# Patient Record
Sex: Female | Born: 1940 | Race: White | Hispanic: No | Marital: Married | Smoking: Never smoker
Health system: Southern US, Community
[De-identification: ages and names within clinical notes are randomized; demographics above are authoritative.]

## PROBLEM LIST (undated history)

## (undated) DIAGNOSIS — M1990 Unspecified osteoarthritis, unspecified site: Secondary | ICD-10-CM

## (undated) DIAGNOSIS — R7303 Prediabetes: Secondary | ICD-10-CM

## (undated) DIAGNOSIS — M109 Gout, unspecified: Secondary | ICD-10-CM

## (undated) DIAGNOSIS — G709 Myoneural disorder, unspecified: Secondary | ICD-10-CM

## (undated) DIAGNOSIS — K219 Gastro-esophageal reflux disease without esophagitis: Secondary | ICD-10-CM

## (undated) HISTORY — PX: BACK SURGERY: SHX140

---

## 1992-04-13 HISTORY — PX: REDUCTION MAMMAPLASTY: SUR839

## 1993-04-13 HISTORY — PX: CHOLECYSTECTOMY: SHX55

## 2004-03-26 ENCOUNTER — Ambulatory Visit: Payer: Self-pay | Admitting: Internal Medicine

## 2005-04-21 ENCOUNTER — Ambulatory Visit: Payer: Self-pay | Admitting: Internal Medicine

## 2006-03-01 ENCOUNTER — Ambulatory Visit: Payer: Self-pay | Admitting: Orthopaedic Surgery

## 2006-04-23 ENCOUNTER — Other Ambulatory Visit: Payer: Self-pay

## 2006-04-26 ENCOUNTER — Ambulatory Visit: Payer: Self-pay | Admitting: Internal Medicine

## 2006-05-03 ENCOUNTER — Ambulatory Visit: Payer: Self-pay | Admitting: Orthopaedic Surgery

## 2006-05-17 ENCOUNTER — Ambulatory Visit: Payer: Self-pay | Admitting: Gastroenterology

## 2006-11-08 ENCOUNTER — Inpatient Hospital Stay: Payer: Self-pay | Admitting: Internal Medicine

## 2006-11-08 ENCOUNTER — Other Ambulatory Visit: Payer: Self-pay

## 2007-05-04 ENCOUNTER — Ambulatory Visit: Payer: Self-pay | Admitting: Internal Medicine

## 2008-05-07 ENCOUNTER — Ambulatory Visit: Payer: Self-pay | Admitting: Internal Medicine

## 2009-05-14 ENCOUNTER — Ambulatory Visit: Payer: Self-pay | Admitting: Internal Medicine

## 2010-03-30 ENCOUNTER — Ambulatory Visit: Payer: Self-pay | Admitting: Internal Medicine

## 2010-05-15 ENCOUNTER — Ambulatory Visit: Payer: Self-pay | Admitting: Internal Medicine

## 2011-05-18 ENCOUNTER — Ambulatory Visit: Payer: Self-pay | Admitting: Internal Medicine

## 2011-12-21 ENCOUNTER — Ambulatory Visit: Payer: Self-pay | Admitting: Gastroenterology

## 2012-06-07 ENCOUNTER — Ambulatory Visit: Payer: Self-pay | Admitting: Internal Medicine

## 2013-06-09 ENCOUNTER — Ambulatory Visit: Payer: Self-pay | Admitting: Internal Medicine

## 2014-04-25 ENCOUNTER — Ambulatory Visit: Payer: Self-pay | Admitting: Rheumatology

## 2014-06-11 ENCOUNTER — Ambulatory Visit: Payer: Self-pay | Admitting: Internal Medicine

## 2014-06-27 ENCOUNTER — Encounter: Payer: Self-pay | Admitting: Rheumatology

## 2014-07-26 ENCOUNTER — Emergency Department
Admit: 2014-07-26 | Disposition: A | Discharge status: Home / Self Care | Payer: Self-pay | Admitting: Emergency Medicine

## 2014-07-26 LAB — COMPREHENSIVE METABOLIC PANEL
ALT: 18 U/L
Albumin: 5.2 g/dL — ABNORMAL HIGH
Alkaline Phosphatase: 87 U/L
Anion Gap: 15 (ref 7–16)
BILIRUBIN TOTAL: 0.6 mg/dL
BUN: 23 mg/dL — ABNORMAL HIGH
CO2: 22 mmol/L
CREATININE: 1.49 mg/dL — AB
Calcium, Total: 10.4 mg/dL — ABNORMAL HIGH
Chloride: 100 mmol/L — ABNORMAL LOW
GFR CALC AF AMER: 41 — AB
GFR CALC NON AF AMER: 35 — AB
GLUCOSE: 219 mg/dL — AB
Potassium: 3.8 mmol/L
SGOT(AST): 38 U/L
SODIUM: 137 mmol/L
Total Protein: 9.5 g/dL — ABNORMAL HIGH

## 2014-07-26 LAB — CBC WITH DIFFERENTIAL/PLATELET
Basophil #: 0.1 10*3/uL (ref 0.0–0.1)
Basophil %: 0.2 %
Eosinophil #: 0 10*3/uL (ref 0.0–0.7)
Eosinophil %: 0.1 %
HCT: 54.2 % — ABNORMAL HIGH (ref 35.0–47.0)
HGB: 17.6 g/dL — AB (ref 12.0–16.0)
LYMPHS ABS: 1.2 10*3/uL (ref 1.0–3.6)
LYMPHS PCT: 5.3 %
MCH: 28.3 pg (ref 26.0–34.0)
MCHC: 32.5 g/dL (ref 32.0–36.0)
MCV: 87 fL (ref 80–100)
MONOS PCT: 2.3 %
Monocyte #: 0.5 x10 3/mm (ref 0.2–0.9)
Neutrophil #: 21.5 10*3/uL — ABNORMAL HIGH (ref 1.4–6.5)
Neutrophil %: 92.1 %
PLATELETS: 371 10*3/uL (ref 150–440)
RBC: 6.21 10*6/uL — ABNORMAL HIGH (ref 3.80–5.20)
RDW: 14 % (ref 11.5–14.5)
WBC: 23.3 10*3/uL — AB (ref 3.6–11.0)

## 2014-07-26 LAB — LIPASE, BLOOD: Lipase: 36 U/L

## 2014-07-26 LAB — TROPONIN I: Troponin-I: <0.03 ng/mL

## 2014-09-28 ENCOUNTER — Other Ambulatory Visit: Payer: Self-pay | Admitting: Physician Assistant

## 2014-09-28 DIAGNOSIS — R1013 Epigastric pain: Secondary | ICD-10-CM

## 2014-09-28 DIAGNOSIS — R197 Diarrhea, unspecified: Secondary | ICD-10-CM

## 2014-09-28 DIAGNOSIS — R112 Nausea with vomiting, unspecified: Secondary | ICD-10-CM

## 2014-10-04 ENCOUNTER — Ambulatory Visit
Admission: RE | Admit: 2014-10-04 | Discharge: 2014-10-04 | Disposition: A | Discharge status: Home / Self Care | Payer: Medicare PPO | Source: Ambulatory Visit | Attending: Physician Assistant | Admitting: Physician Assistant

## 2014-10-04 DIAGNOSIS — R112 Nausea with vomiting, unspecified: Secondary | ICD-10-CM | POA: Insufficient documentation

## 2014-10-04 DIAGNOSIS — R197 Diarrhea, unspecified: Secondary | ICD-10-CM | POA: Diagnosis present

## 2014-10-04 DIAGNOSIS — N281 Cyst of kidney, acquired: Secondary | ICD-10-CM | POA: Insufficient documentation

## 2014-10-04 DIAGNOSIS — R1013 Epigastric pain: Secondary | ICD-10-CM

## 2014-10-04 MED ORDER — IOHEXOL 300 MG/ML  SOLN
100.0000 mL | Freq: Once | INTRAMUSCULAR | Status: AC | PRN
  Administered 2014-10-04: 100 mL via INTRAVENOUS

## 2015-06-06 ENCOUNTER — Other Ambulatory Visit: Payer: Self-pay | Admitting: Internal Medicine

## 2015-06-06 DIAGNOSIS — Z1231 Encounter for screening mammogram for malignant neoplasm of breast: Secondary | ICD-10-CM

## 2015-06-14 ENCOUNTER — Ambulatory Visit
Admission: RE | Admit: 2015-06-14 | Discharge: 2015-06-14 | Disposition: A | Discharge status: Home / Self Care | Payer: Medicare Other | Source: Ambulatory Visit | Attending: Internal Medicine | Admitting: Internal Medicine

## 2015-06-14 ENCOUNTER — Other Ambulatory Visit: Payer: Self-pay | Admitting: Internal Medicine

## 2015-06-14 DIAGNOSIS — Z1231 Encounter for screening mammogram for malignant neoplasm of breast: Secondary | ICD-10-CM

## 2016-06-12 ENCOUNTER — Other Ambulatory Visit: Payer: Self-pay | Admitting: Internal Medicine

## 2016-06-12 DIAGNOSIS — Z1231 Encounter for screening mammogram for malignant neoplasm of breast: Secondary | ICD-10-CM

## 2016-06-16 ENCOUNTER — Ambulatory Visit
Admission: RE | Admit: 2016-06-16 | Discharge: 2016-06-16 | Disposition: A | Discharge status: Home / Self Care | Payer: Medicare Other | Source: Ambulatory Visit | Attending: Internal Medicine | Admitting: Internal Medicine

## 2016-06-16 DIAGNOSIS — Z1231 Encounter for screening mammogram for malignant neoplasm of breast: Secondary | ICD-10-CM | POA: Insufficient documentation

## 2016-09-23 ENCOUNTER — Other Ambulatory Visit: Payer: Self-pay | Admitting: Orthopedic Surgery

## 2016-09-23 DIAGNOSIS — M5416 Radiculopathy, lumbar region: Secondary | ICD-10-CM

## 2016-09-25 ENCOUNTER — Ambulatory Visit
Admission: RE | Admit: 2016-09-25 | Discharge: 2016-09-25 | Disposition: A | Discharge status: Home / Self Care | Payer: Medicare Other | Source: Ambulatory Visit | Attending: Orthopedic Surgery | Admitting: Orthopedic Surgery

## 2016-09-25 DIAGNOSIS — M5416 Radiculopathy, lumbar region: Secondary | ICD-10-CM

## 2016-10-01 ENCOUNTER — Other Ambulatory Visit: Payer: Self-pay | Admitting: Orthopedic Surgery

## 2016-10-13 NOTE — Pre-Procedure Instructions (Signed)
Meredith Roach  10/13/2016      [facility] [place], [place] - [address] [place] [address] Phone: [phone] Fax: [phone]    Your procedure is scheduled on 10/21/16  Report to [facility] Admitting at 1100 A.M.  Call this number if you have problems the morning of surgery:  [phone]   Remember:  Do not eat food or drink liquids after midnight.  Take these medicines the morning of surgery with A SIP OF WATER     Duloxetine,ranitidine, hydrocodone if needed  STOP all herbel meds, nsaids (aleve,naproxen,advil,ibuprofen)   prior to surgery starting today/10/15/16 including all vitamins/supplements,cranberry-vit c,glucosamin-chondrotin ,biotin    Do not wear jewelry, make-up or nail polish.  Do not wear lotions, powders, or perfumes, or deoderant.  Do not shave 48 hours prior to surgery.  Men may shave face and neck.  Do not bring valuables to the hospital.  [facility] is not responsible for any belongings or valuables.  Contacts, dentures or bridgework may not be worn into surgery.  Leave your suitcase in the car.  After surgery it may be brought to your room.  For patients admitted to the hospital, discharge time will be determined by your treatment team.  Patients discharged the day of surgery will not be allowed to drive home.   Special instructions:   Special Instructions: [place] - Preparing for Surgery  Before surgery, you can play an important role.  Because skin is not sterile, your skin needs to be as free of germs as possible.  You can reduce the number of germs on you skin by washing with CHG (chlorahexidine gluconate) soap before surgery.  CHG is an antiseptic cleaner which kills germs and bonds with the skin to continue killing germs even after washing.  Please DO NOT use if you have an allergy to CHG or antibacterial soaps.  If your skin becomes reddened/irritated stop using the CHG and inform your nurse  when you arrive at Short Stay.  Do not shave (including legs and underarms) for at least 48 hours prior to the first CHG shower.  You may shave your face.  Please follow these instructions carefully:   1.  Shower with CHG Soap the night before surgery and the morning of Surgery.  2.  If you choose to wash your hair, wash your hair first as usual with your normal shampoo.  3.  After you shampoo, rinse your hair and body thoroughly to remove the Shampoo.  4.  Use CHG as you would any other liquid soap.  You can apply chg directly  to the skin and wash gently with scrungie or a clean washcloth.  5.  Apply the CHG Soap to your body ONLY FROM THE NECK DOWN.  Do not use on open wounds or open sores.  Avoid contact with your eyes ears, mouth and genitals (private parts).  Wash genitals (private parts)       with your normal soap.  6.  Wash thoroughly, paying special attention to the area where your surgery will be performed.  7.  Thoroughly rinse your body with warm water from the neck down.  8.  DO NOT shower/wash with your normal soap after using and rinsing off the CHG Soap.  9.  Pat yourself dry with a clean towel.            10.  Wear clean pajamas.  11.  Place clean sheets on your bed the night of your first shower and do not sleep with pets.  Day of Surgery  Do not apply any lotions/deodorants the morning of surgery.  Please wear clean clothes to the hospital/surgery center.  Please read over the  fact sheets that you were given.

## 2016-10-15 ENCOUNTER — Encounter (HOSPITAL_COMMUNITY): Payer: Self-pay

## 2016-10-15 ENCOUNTER — Encounter (HOSPITAL_COMMUNITY)
Admission: RE | Admit: 2016-10-15 | Discharge: 2016-10-15 | Disposition: A | Discharge status: Home / Self Care | Payer: Medicare Other | Source: Ambulatory Visit | Attending: Orthopedic Surgery | Admitting: Orthopedic Surgery

## 2016-10-15 ENCOUNTER — Ambulatory Visit (HOSPITAL_COMMUNITY)
Admission: RE | Admit: 2016-10-15 | Discharge: 2016-10-15 | Disposition: A | Discharge status: Home / Self Care | Payer: Medicare Other | Source: Ambulatory Visit | Attending: Orthopedic Surgery | Admitting: Orthopedic Surgery

## 2016-10-15 DIAGNOSIS — Z01818 Encounter for other preprocedural examination: Secondary | ICD-10-CM | POA: Insufficient documentation

## 2016-10-15 DIAGNOSIS — Z01812 Encounter for preprocedural laboratory examination: Secondary | ICD-10-CM | POA: Insufficient documentation

## 2016-10-15 DIAGNOSIS — Z0183 Encounter for blood typing: Secondary | ICD-10-CM | POA: Diagnosis not present

## 2016-10-15 DIAGNOSIS — M79604 Pain in right leg: Secondary | ICD-10-CM | POA: Diagnosis not present

## 2016-10-15 DIAGNOSIS — R9431 Abnormal electrocardiogram [ECG] [EKG]: Secondary | ICD-10-CM | POA: Diagnosis not present

## 2016-10-15 HISTORY — DX: Prediabetes: R73.03

## 2016-10-15 HISTORY — DX: Unspecified osteoarthritis, unspecified site: M19.90

## 2016-10-15 HISTORY — DX: Gastro-esophageal reflux disease without esophagitis: K21.9

## 2016-10-15 LAB — COMPREHENSIVE METABOLIC PANEL
ALK PHOS: 62 U/L (ref 38–126)
ALT: 16 U/L (ref 14–54)
AST: 25 U/L (ref 15–41)
Albumin: 4 g/dL (ref 3.5–5.0)
Anion gap: 11 (ref 5–15)
BILIRUBIN TOTAL: 0.8 mg/dL (ref 0.3–1.2)
BUN: 14 mg/dL (ref 6–20)
CALCIUM: 9.2 mg/dL (ref 8.9–10.3)
CHLORIDE: 103 mmol/L (ref 101–111)
CO2: 28 mmol/L (ref 22–32)
CREATININE: 0.93 mg/dL (ref 0.44–1.00)
GFR calc Af Amer: >60 mL/min (ref >60)
GFR calc non Af Amer: 60 mL/min — ABNORMAL LOW (ref >60)
Glucose, Bld: 131 mg/dL — ABNORMAL HIGH (ref 65–99)
Potassium: 3.4 mmol/L — ABNORMAL LOW (ref 3.5–5.1)
Sodium: 142 mmol/L (ref 135–145)
Total Protein: 6.9 g/dL (ref 6.5–8.1)

## 2016-10-15 LAB — URINALYSIS, ROUTINE W REFLEX MICROSCOPIC
BILIRUBIN URINE: NEGATIVE
GLUCOSE, UA: NEGATIVE mg/dL
Hgb urine dipstick: NEGATIVE
KETONES UR: NEGATIVE mg/dL
Nitrite: NEGATIVE
PH: 5 (ref 5.0–8.0)
Protein, ur: NEGATIVE mg/dL
Specific Gravity, Urine: 1.027 (ref 1.005–1.030)

## 2016-10-15 LAB — CBC WITH DIFFERENTIAL/PLATELET
BASOS ABS: 0 10*3/uL (ref 0.0–0.1)
Basophils Relative: 0 %
Eosinophils Absolute: 0.1 10*3/uL (ref 0.0–0.7)
Eosinophils Relative: 2 %
HEMATOCRIT: 44.4 % (ref 36.0–46.0)
HEMOGLOBIN: 14.6 g/dL (ref 12.0–15.0)
LYMPHS ABS: 1.8 10*3/uL (ref 0.7–4.0)
LYMPHS PCT: 33 %
MCH: 28.6 pg (ref 26.0–34.0)
MCHC: 32.9 g/dL (ref 30.0–36.0)
MCV: 86.9 fL (ref 78.0–100.0)
Monocytes Absolute: 0.3 10*3/uL (ref 0.1–1.0)
Monocytes Relative: 6 %
NEUTROS ABS: 3.2 10*3/uL (ref 1.7–7.7)
NEUTROS PCT: 59 %
PLATELETS: 241 10*3/uL (ref 150–400)
RBC: 5.11 MIL/uL (ref 3.87–5.11)
RDW: 13.1 % (ref 11.5–15.5)
WBC: 5.5 10*3/uL (ref 4.0–10.5)

## 2016-10-15 LAB — TYPE AND SCREEN
ABO/RH(D): A POS
ANTIBODY SCREEN: NEGATIVE

## 2016-10-15 LAB — APTT: aPTT: 25 seconds (ref 24–36)

## 2016-10-15 LAB — ABO/RH: ABO/RH(D): A POS

## 2016-10-15 LAB — PROTIME-INR
INR: 0.9
PROTHROMBIN TIME: 12.1 s (ref 11.4–15.2)

## 2016-10-15 LAB — SURGICAL PCR SCREEN
MRSA, PCR: NEGATIVE
Staphylococcus aureus: NEGATIVE

## 2016-10-15 NOTE — Progress Notes (Signed)
PCP:Dr. Emily Filbert @ [facility]

## 2016-10-15 NOTE — Pre-Procedure Instructions (Signed)
JOMARIE GELLIS  10/15/2016      [facility] [place], [place] - [address] [place] [address] Phone: [phone] Fax: [phone]    Your procedure is scheduled on 10/21/16  Report to [facility] Admitting at 10:00 A.M.  Call this number if you have problems the morning of surgery:  [phone]   Remember:  Do not eat food or drink liquids after midnight.  Take these medicines the morning of surgery with A SIP OF WATER     Duloxetine,ranitidine, hydrocodone if needed  STOP all herbel meds, nsaids (aleve,naproxen,advil,ibuprofen)   prior to surgery starting today/10/15/16 including all vitamins/supplements,cranberry-vit c,glucosamin-chondrotin ,biotin    Do not wear jewelry, make-up or nail polish.  Do not wear lotions, powders, or perfumes, or deoderant.  Do not shave 48 hours prior to surgery.  Men may shave face and neck.  Do not bring valuables to the hospital.  [facility] is not responsible for any belongings or valuables.  Contacts, dentures or bridgework may not be worn into surgery.  Leave your suitcase in the car.  After surgery it may be brought to your room.  For patients admitted to the hospital, discharge time will be determined by your treatment team.  Patients discharged the day of surgery will not be allowed to drive home.   Special instructions:   Special Instructions: [place] - Preparing for Surgery  Before surgery, you can play an important role.  Because skin is not sterile, your skin needs to be as free of germs as possible.  You can reduce the number of germs on you skin by washing with CHG (chlorahexidine gluconate) soap before surgery.  CHG is an antiseptic cleaner which kills germs and bonds with the skin to continue killing germs even after washing.  Please DO NOT use if you have an allergy to CHG or antibacterial soaps.  If your skin becomes reddened/irritated stop using the CHG and inform your nurse  when you arrive at Short Stay.  Do not shave (including legs and underarms) for at least 48 hours prior to the first CHG shower.  You may shave your face.  Please follow these instructions carefully:   1.  Shower with CHG Soap the night before surgery and the morning of Surgery.  2.  If you choose to wash your hair, wash your hair first as usual with your normal shampoo.  3.  After you shampoo, rinse your hair and body thoroughly to remove the Shampoo.  4.  Use CHG as you would any other liquid soap.  You can apply chg directly  to the skin and wash gently with scrungie or a clean washcloth.  5.  Apply the CHG Soap to your body ONLY FROM THE NECK DOWN.  Do not use on open wounds or open sores.  Avoid contact with your eyes ears, mouth and genitals (private parts).  Wash genitals (private parts)       with your normal soap.  6.  Wash thoroughly, paying special attention to the area where your surgery will be performed.  7.  Thoroughly rinse your body with warm water from the neck down.  8.  DO NOT shower/wash with your normal soap after using and rinsing off the CHG Soap.  9.  Pat yourself dry with a clean towel.            10.  Wear clean pajamas.  11.  Place clean sheets on your bed the night of your first shower and do not sleep with pets.  Day of Surgery  Do not apply any lotions/deodorants the morning of surgery.  Please wear clean clothes to the hospital/surgery center.  Please read over the  fact sheets that you were given.

## 2016-10-21 ENCOUNTER — Inpatient Hospital Stay (HOSPITAL_COMMUNITY): Payer: Medicare Other | Admitting: Anesthesiology

## 2016-10-21 ENCOUNTER — Inpatient Hospital Stay (HOSPITAL_COMMUNITY): Payer: Medicare Other

## 2016-10-21 ENCOUNTER — Encounter (HOSPITAL_COMMUNITY): Payer: Self-pay | Admitting: Anesthesiology

## 2016-10-21 ENCOUNTER — Inpatient Hospital Stay (HOSPITAL_COMMUNITY)
Admission: RE | Admit: 2016-10-21 | Discharge: 2016-10-22 | DRG: 455 | Disposition: A | Discharge status: Home / Self Care | Payer: Medicare Other | Source: Ambulatory Visit | Attending: Orthopedic Surgery | Admitting: Orthopedic Surgery

## 2016-10-21 ENCOUNTER — Encounter (HOSPITAL_COMMUNITY)
Admission: RE | Disposition: A | Discharge status: Home / Self Care | Payer: Self-pay | Source: Ambulatory Visit | Attending: Orthopedic Surgery

## 2016-10-21 DIAGNOSIS — R7303 Prediabetes: Secondary | ICD-10-CM | POA: Diagnosis present

## 2016-10-21 DIAGNOSIS — M4316 Spondylolisthesis, lumbar region: Secondary | ICD-10-CM | POA: Diagnosis present

## 2016-10-21 DIAGNOSIS — Z9049 Acquired absence of other specified parts of digestive tract: Secondary | ICD-10-CM

## 2016-10-21 DIAGNOSIS — Z6832 Body mass index (BMI) 32.0-32.9, adult: Secondary | ICD-10-CM

## 2016-10-21 DIAGNOSIS — Z79899 Other long term (current) drug therapy: Secondary | ICD-10-CM | POA: Diagnosis not present

## 2016-10-21 DIAGNOSIS — Z419 Encounter for procedure for purposes other than remedying health state, unspecified: Secondary | ICD-10-CM

## 2016-10-21 DIAGNOSIS — Z803 Family history of malignant neoplasm of breast: Secondary | ICD-10-CM | POA: Diagnosis not present

## 2016-10-21 DIAGNOSIS — K219 Gastro-esophageal reflux disease without esophagitis: Secondary | ICD-10-CM | POA: Diagnosis not present

## 2016-10-21 DIAGNOSIS — E669 Obesity, unspecified: Secondary | ICD-10-CM | POA: Diagnosis not present

## 2016-10-21 DIAGNOSIS — M79604 Pain in right leg: Secondary | ICD-10-CM | POA: Diagnosis present

## 2016-10-21 DIAGNOSIS — M5416 Radiculopathy, lumbar region: Secondary | ICD-10-CM | POA: Diagnosis present

## 2016-10-21 DIAGNOSIS — M5410 Radiculopathy, site unspecified: Secondary | ICD-10-CM | POA: Diagnosis present

## 2016-10-21 DIAGNOSIS — M1990 Unspecified osteoarthritis, unspecified site: Secondary | ICD-10-CM | POA: Diagnosis present

## 2016-10-21 DIAGNOSIS — M48061 Spinal stenosis, lumbar region without neurogenic claudication: Principal | ICD-10-CM | POA: Diagnosis present

## 2016-10-21 DIAGNOSIS — Z79891 Long term (current) use of opiate analgesic: Secondary | ICD-10-CM | POA: Diagnosis not present

## 2016-10-21 SURGERY — POSTERIOR LUMBAR FUSION 1 LEVEL
Anesthesia: General | Laterality: Right

## 2016-10-21 MED ORDER — FENTANYL CITRATE (PF) 100 MCG/2ML IJ SOLN: 25.0000 ug | INTRAMUSCULAR | Status: DC | PRN

## 2016-10-21 MED ORDER — MENTHOL 3 MG MT LOZG: 1.0000 | LOZENGE | OROMUCOSAL | Status: DC | PRN

## 2016-10-21 MED ORDER — POTASSIUM CHLORIDE IN NACL 20-0.9 MEQ/L-% IV SOLN: INTRAVENOUS | Status: DC

## 2016-10-21 MED ORDER — CEFAZOLIN SODIUM-DEXTROSE 2-4 GM/100ML-% IV SOLN
2.0000 g | INTRAVENOUS | Status: AC
  Administered 2016-10-21: 2 g via INTRAVENOUS
  Filled 2016-10-21: qty 100

## 2016-10-21 MED ORDER — DEXTROSE 5 % IV SOLN
INTRAVENOUS | Status: DC | PRN
  Administered 2016-10-21: 15 ug/min via INTRAVENOUS

## 2016-10-21 MED ORDER — LIDOCAINE HCL (CARDIAC) 20 MG/ML IV SOLN
INTRAVENOUS | Status: DC | PRN
  Administered 2016-10-21: 60 mg via INTRAVENOUS

## 2016-10-21 MED ORDER — LACTATED RINGERS IV SOLN: INTRAVENOUS | Status: DC | PRN

## 2016-10-21 MED ORDER — ACETAMINOPHEN 325 MG PO TABS: 650.0000 mg | ORAL_TABLET | ORAL | Status: DC | PRN

## 2016-10-21 MED ORDER — ROCURONIUM BROMIDE 50 MG/5ML IV SOLN
INTRAVENOUS | Status: AC
  Filled 2016-10-21: qty 1

## 2016-10-21 MED ORDER — BUPIVACAINE-EPINEPHRINE (PF) 0.25% -1:200000 IJ SOLN
INTRAMUSCULAR | Status: AC
  Filled 2016-10-21: qty 30

## 2016-10-21 MED ORDER — PROPOFOL 10 MG/ML IV BOLUS
INTRAVENOUS | Status: DC | PRN
  Administered 2016-10-21: 170 mg via INTRAVENOUS

## 2016-10-21 MED ORDER — FLEET ENEMA 7-19 GM/118ML RE ENEM: 1.0000 | ENEMA | Freq: Once | RECTAL | Status: DC | PRN

## 2016-10-21 MED ORDER — SODIUM CHLORIDE 0.9% FLUSH: 3.0000 mL | Freq: Two times a day (BID) | INTRAVENOUS | Status: DC

## 2016-10-21 MED ORDER — ROCURONIUM BROMIDE 100 MG/10ML IV SOLN
INTRAVENOUS | Status: DC | PRN
  Administered 2016-10-21: 20 mg via INTRAVENOUS
  Administered 2016-10-21: 50 mg via INTRAVENOUS

## 2016-10-21 MED ORDER — CEFAZOLIN SODIUM-DEXTROSE 2-4 GM/100ML-% IV SOLN
2.0000 g | Freq: Three times a day (TID) | INTRAVENOUS | Status: AC
  Administered 2016-10-21 – 2016-10-22 (×2): 2 g via INTRAVENOUS
  Filled 2016-10-21 (×2): qty 100

## 2016-10-21 MED ORDER — ACETAMINOPHEN 650 MG RE SUPP: 650.0000 mg | RECTAL | Status: DC | PRN

## 2016-10-21 MED ORDER — MIDAZOLAM HCL 2 MG/2ML IJ SOLN
INTRAMUSCULAR | Status: AC
  Filled 2016-10-21: qty 2

## 2016-10-21 MED ORDER — HEMOSTATIC AGENTS (NO CHARGE) OPTIME
TOPICAL | Status: DC | PRN
  Administered 2016-10-21: 1 via TOPICAL

## 2016-10-21 MED ORDER — THROMBIN 20000 UNITS EX KIT
PACK | CUTANEOUS | Status: DC | PRN
  Administered 2016-10-21: 20000 [IU] via TOPICAL

## 2016-10-21 MED ORDER — THROMBIN 20000 UNITS EX SOLR
CUTANEOUS | Status: AC
  Filled 2016-10-21: qty 20000

## 2016-10-21 MED ORDER — DIAZEPAM 5 MG PO TABS
5.0000 mg | ORAL_TABLET | Freq: Four times a day (QID) | ORAL | Status: DC | PRN
  Administered 2016-10-21 – 2016-10-22 (×2): 5 mg via ORAL
  Filled 2016-10-21 (×2): qty 1

## 2016-10-21 MED ORDER — PANTOPRAZOLE SODIUM 40 MG IV SOLR
40.0000 mg | Freq: Every day | INTRAVENOUS | Status: DC
  Administered 2016-10-21: 40 mg via INTRAVENOUS
  Filled 2016-10-21: qty 40

## 2016-10-21 MED ORDER — LACTATED RINGERS IV SOLN
INTRAVENOUS | Status: DC | PRN
  Administered 2016-10-21 (×3): via INTRAVENOUS

## 2016-10-21 MED ORDER — SENNOSIDES-DOCUSATE SODIUM 8.6-50 MG PO TABS: 1.0000 | ORAL_TABLET | Freq: Every evening | ORAL | Status: DC | PRN

## 2016-10-21 MED ORDER — DULOXETINE HCL 30 MG PO CPEP
60.0000 mg | ORAL_CAPSULE | Freq: Every day | ORAL | Status: DC
  Administered 2016-10-21: 60 mg via ORAL
  Filled 2016-10-21: qty 2

## 2016-10-21 MED ORDER — BISACODYL 5 MG PO TBEC: 5.0000 mg | DELAYED_RELEASE_TABLET | Freq: Every day | ORAL | Status: DC | PRN

## 2016-10-21 MED ORDER — LIDOCAINE HCL (CARDIAC) 20 MG/ML IV SOLN
INTRAVENOUS | Status: AC
  Filled 2016-10-21: qty 5

## 2016-10-21 MED ORDER — SUCCINYLCHOLINE CHLORIDE 200 MG/10ML IV SOSY
PREFILLED_SYRINGE | INTRAVENOUS | Status: AC
  Filled 2016-10-21: qty 10

## 2016-10-21 MED ORDER — BUPIVACAINE LIPOSOME 1.3 % IJ SUSP
INTRAMUSCULAR | Status: DC | PRN
  Administered 2016-10-21: 20 mL

## 2016-10-21 MED ORDER — ZOLPIDEM TARTRATE 5 MG PO TABS: 5.0000 mg | ORAL_TABLET | Freq: Every evening | ORAL | Status: DC | PRN

## 2016-10-21 MED ORDER — OXYCODONE HCL 5 MG/5ML PO SOLN: 5.0000 mg | Freq: Once | ORAL | Status: DC | PRN

## 2016-10-21 MED ORDER — OXYCODONE HCL 5 MG PO TABS: 5.0000 mg | ORAL_TABLET | Freq: Once | ORAL | Status: DC | PRN

## 2016-10-21 MED ORDER — DEXAMETHASONE SODIUM PHOSPHATE 10 MG/ML IJ SOLN
INTRAMUSCULAR | Status: AC
  Filled 2016-10-21: qty 1

## 2016-10-21 MED ORDER — DOCUSATE SODIUM 100 MG PO CAPS
100.0000 mg | ORAL_CAPSULE | Freq: Two times a day (BID) | ORAL | Status: DC
  Administered 2016-10-21: 100 mg via ORAL
  Filled 2016-10-21: qty 1

## 2016-10-21 MED ORDER — SUGAMMADEX SODIUM 200 MG/2ML IV SOLN
INTRAVENOUS | Status: DC | PRN
  Administered 2016-10-21: 200 mg via INTRAVENOUS

## 2016-10-21 MED ORDER — POVIDONE-IODINE 7.5 % EX SOLN
Freq: Once | CUTANEOUS | Status: DC
  Filled 2016-10-21: qty 118

## 2016-10-21 MED ORDER — PHENOL 1.4 % MT LIQD: 1.0000 | OROMUCOSAL | Status: DC | PRN

## 2016-10-21 MED ORDER — ALUM & MAG HYDROXIDE-SIMETH 200-200-20 MG/5ML PO SUSP: 30.0000 mL | Freq: Four times a day (QID) | ORAL | Status: DC | PRN

## 2016-10-21 MED ORDER — SODIUM CHLORIDE 0.9% FLUSH: 3.0000 mL | INTRAVENOUS | Status: DC | PRN

## 2016-10-21 MED ORDER — PROPOFOL 10 MG/ML IV BOLUS
INTRAVENOUS | Status: AC
  Filled 2016-10-21: qty 20

## 2016-10-21 MED ORDER — LACTATED RINGERS IV SOLN
INTRAVENOUS | Status: DC
Start: 2016-10-21 — End: 2016-10-21
  Administered 2016-10-21: 10:00:00 via INTRAVENOUS

## 2016-10-21 MED ORDER — PROPOFOL 500 MG/50ML IV EMUL
INTRAVENOUS | Status: DC | PRN
  Administered 2016-10-21: 55 ug/kg/min via INTRAVENOUS

## 2016-10-21 MED ORDER — MIDAZOLAM HCL 5 MG/5ML IJ SOLN
INTRAMUSCULAR | Status: DC | PRN
  Administered 2016-10-21: 2 mg via INTRAVENOUS

## 2016-10-21 MED ORDER — ONDANSETRON HCL 4 MG/2ML IJ SOLN
INTRAMUSCULAR | Status: AC
  Filled 2016-10-21: qty 2

## 2016-10-21 MED ORDER — MORPHINE SULFATE (PF) 4 MG/ML IV SOLN: 1.0000 mg | INTRAVENOUS | Status: DC | PRN

## 2016-10-21 MED ORDER — SODIUM CHLORIDE 0.9 % IV SOLN
INTRAVENOUS | Status: AC | PRN
  Administered 2016-10-21: 84.4 mg via INTRAVENOUS

## 2016-10-21 MED ORDER — FAMOTIDINE 20 MG PO TABS: 20.0000 mg | ORAL_TABLET | Freq: Every day | ORAL | Status: DC

## 2016-10-21 MED ORDER — OXYCODONE-ACETAMINOPHEN 5-325 MG PO TABS
1.0000 | ORAL_TABLET | ORAL | Status: DC | PRN
  Administered 2016-10-21 – 2016-10-22 (×2): 1 via ORAL
  Filled 2016-10-21 (×2): qty 1

## 2016-10-21 MED ORDER — FENTANYL CITRATE (PF) 100 MCG/2ML IJ SOLN
INTRAMUSCULAR | Status: DC | PRN
  Administered 2016-10-21 (×3): 50 ug via INTRAVENOUS
  Administered 2016-10-21: 100 ug via INTRAVENOUS
  Administered 2016-10-21 (×3): 50 ug via INTRAVENOUS
  Administered 2016-10-21: 100 ug via INTRAVENOUS

## 2016-10-21 MED ORDER — ONDANSETRON HCL 4 MG/2ML IJ SOLN
INTRAMUSCULAR | Status: DC | PRN
  Administered 2016-10-21: 4 mg via INTRAVENOUS

## 2016-10-21 MED ORDER — ARTIFICIAL TEARS OPHTHALMIC OINT
TOPICAL_OINTMENT | OPHTHALMIC | Status: AC
  Filled 2016-10-21: qty 10.5

## 2016-10-21 MED ORDER — ARTIFICIAL TEARS OPHTHALMIC OINT
TOPICAL_OINTMENT | OPHTHALMIC | Status: DC | PRN
  Administered 2016-10-21: 1 via OPHTHALMIC

## 2016-10-21 MED ORDER — BUPIVACAINE LIPOSOME 1.3 % IJ SUSP
20.0000 mL | Freq: Once | INTRAMUSCULAR | Status: DC
  Filled 2016-10-21: qty 20

## 2016-10-21 MED ORDER — BUPIVACAINE-EPINEPHRINE 0.25% -1:200000 IJ SOLN
INTRAMUSCULAR | Status: DC | PRN
  Administered 2016-10-21: 10 mL
  Administered 2016-10-21: 20 mL

## 2016-10-21 MED ORDER — ONDANSETRON HCL 4 MG/2ML IJ SOLN: 4.0000 mg | Freq: Four times a day (QID) | INTRAMUSCULAR | Status: DC | PRN

## 2016-10-21 MED ORDER — ONDANSETRON HCL 4 MG PO TABS: 4.0000 mg | ORAL_TABLET | Freq: Four times a day (QID) | ORAL | Status: DC | PRN

## 2016-10-21 MED ORDER — SODIUM CHLORIDE 0.9 % IV SOLN: 250.0000 mL | INTRAVENOUS | Status: DC

## 2016-10-21 MED ORDER — 0.9 % SODIUM CHLORIDE (POUR BTL) OPTIME
TOPICAL | Status: DC | PRN
  Administered 2016-10-21 (×3): 1000 mL

## 2016-10-21 MED ORDER — HYDROCHLOROTHIAZIDE 25 MG PO TABS
12.5000 mg | ORAL_TABLET | Freq: Every day | ORAL | Status: DC
  Administered 2016-10-21: 12.5 mg via ORAL
  Filled 2016-10-21: qty 1

## 2016-10-21 MED ORDER — FENTANYL CITRATE (PF) 250 MCG/5ML IJ SOLN
INTRAMUSCULAR | Status: AC
  Filled 2016-10-21: qty 5

## 2016-10-21 MED ORDER — METHYLENE BLUE 0.5 % INJ SOLN
INTRAVENOUS | Status: AC
  Filled 2016-10-21: qty 10

## 2016-10-21 MED ORDER — ONDANSETRON HCL 4 MG/2ML IJ SOLN: 4.0000 mg | Freq: Once | INTRAMUSCULAR | Status: DC | PRN

## 2016-10-21 SURGICAL SUPPLY — 90 items
BENZOIN TINCTURE PRP APPL 2/3 (GAUZE/BANDAGES/DRESSINGS) ×3 IMPLANT
BIT DRILL 3.2 (BIT) ×2
BIT DRILL 65X3.2XQC STP NS (BIT) ×1 IMPLANT
BIT DRL 65X3.2XQC STP NS (BIT) ×1
BLADE CLIPPER SURG (BLADE) ×3 IMPLANT
BONE VIVIGEN FORMABLE 10CC (Bone Implant) ×3 IMPLANT
BUR PRESCISION 1.7 ELITE (BURR) ×3 IMPLANT
BUR ROUND PRECISION 4.0 (BURR) IMPLANT
BUR ROUND PRECISION 4.0MM (BURR)
BUR SABER RD CUTTING 3.0 (BURR) IMPLANT
BUR SABER RD CUTTING 3.0MM (BURR)
CAGE CONCORDE BULLET 9X11X27 (Cage) ×1 IMPLANT
CAGE CONCORDE BULLET 9X11X27MM (Cage) ×1 IMPLANT
CAGE SPNL 5D BLT NOSE 27X9X11 (Cage) ×1 IMPLANT
CARTRIDGE OIL MAESTRO DRILL (MISCELLANEOUS) ×1 IMPLANT
CLOSURE WOUND 1/2 X4 (GAUZE/BANDAGES/DRESSINGS) ×1
CONT SPEC 4OZ CLIKSEAL STRL BL (MISCELLANEOUS) IMPLANT
COVER MAYO STAND STRL (DRAPES) ×3 IMPLANT
COVER SURGICAL LIGHT HANDLE (MISCELLANEOUS) ×3 IMPLANT
DIFFUSER DRILL AIR PNEUMATIC (MISCELLANEOUS) ×3 IMPLANT
DRAIN CHANNEL 15F RND FF W/TCR (WOUND CARE) IMPLANT
DRAPE C-ARM 42X72 X-RAY (DRAPES) ×3 IMPLANT
DRAPE C-ARMOR (DRAPES) ×3 IMPLANT
DRAPE POUCH INSTRU U-SHP 10X18 (DRAPES) ×3 IMPLANT
DRAPE SURG 17X23 STRL (DRAPES) ×12 IMPLANT
DURAPREP 26ML APPLICATOR (WOUND CARE) ×3 IMPLANT
ELECT BLADE 4.0 EZ CLEAN MEGAD (MISCELLANEOUS) ×3
ELECT CAUTERY BLADE 6.4 (BLADE) ×3 IMPLANT
ELECT REM PT RETURN 9FT ADLT (ELECTROSURGICAL) ×3
ELECTRODE BLDE 4.0 EZ CLN MEGD (MISCELLANEOUS) ×1 IMPLANT
ELECTRODE REM PT RTRN 9FT ADLT (ELECTROSURGICAL) ×1 IMPLANT
EVACUATOR SILICONE 100CC (DRAIN) IMPLANT
FEE INTRAOP MONITOR IMPULS NCS (MISCELLANEOUS) ×1 IMPLANT
GAUZE SPONGE 4X4 12PLY STRL (GAUZE/BANDAGES/DRESSINGS) ×3 IMPLANT
GAUZE SPONGE 4X4 16PLY XRAY LF (GAUZE/BANDAGES/DRESSINGS) ×3 IMPLANT
GLOVE BIO SURGEON STRL SZ7 (GLOVE) ×6 IMPLANT
GLOVE BIO SURGEON STRL SZ8 (GLOVE) ×6 IMPLANT
GLOVE BIOGEL PI IND STRL 6.5 (GLOVE) ×1 IMPLANT
GLOVE BIOGEL PI IND STRL 7.0 (GLOVE) ×2 IMPLANT
GLOVE BIOGEL PI IND STRL 8 (GLOVE) ×1 IMPLANT
GLOVE BIOGEL PI INDICATOR 6.5 (GLOVE) ×2
GLOVE BIOGEL PI INDICATOR 7.0 (GLOVE) ×4
GLOVE BIOGEL PI INDICATOR 8 (GLOVE) ×2
GLOVE SURG SS PI 6.5 STRL IVOR (GLOVE) ×15 IMPLANT
GOWN STRL REUS W/ TWL LRG LVL3 (GOWN DISPOSABLE) ×3 IMPLANT
GOWN STRL REUS W/ TWL XL LVL3 (GOWN DISPOSABLE) ×1 IMPLANT
GOWN STRL REUS W/TWL LRG LVL3 (GOWN DISPOSABLE) ×6
GOWN STRL REUS W/TWL XL LVL3 (GOWN DISPOSABLE) ×2
INTRAOP MONITOR FEE IMPULS NCS (MISCELLANEOUS) ×1
INTRAOP MONITOR FEE IMPULSE (MISCELLANEOUS) ×2
IV CATH 14GX2 1/4 (CATHETERS) ×3 IMPLANT
KIT BASIN OR (CUSTOM PROCEDURE TRAY) ×3 IMPLANT
KIT POSITION SURG JACKSON T1 (MISCELLANEOUS) ×3 IMPLANT
KIT ROOM TURNOVER OR (KITS) ×3 IMPLANT
MARKER SKIN DUAL TIP RULER LAB (MISCELLANEOUS) ×3 IMPLANT
NDL SAFETY ECLIPSE 18X1.5 (NEEDLE) ×1 IMPLANT
NEEDLE 22X1 1/2 (OR ONLY) (NEEDLE) ×3 IMPLANT
NEEDLE HYPO 18GX1.5 SHARP (NEEDLE) ×2
NEEDLE HYPO 25GX1X1/2 BEV (NEEDLE) ×3 IMPLANT
NEEDLE SPNL 18GX3.5 QUINCKE PK (NEEDLE) ×6 IMPLANT
NS IRRIG 1000ML POUR BTL (IV SOLUTION) ×9 IMPLANT
OIL CARTRIDGE MAESTRO DRILL (MISCELLANEOUS) ×3
PACK LAMINECTOMY ORTHO (CUSTOM PROCEDURE TRAY) ×3 IMPLANT
PACK UNIVERSAL I (CUSTOM PROCEDURE TRAY) ×3 IMPLANT
PAD ARMBOARD 7.5X6 YLW CONV (MISCELLANEOUS) ×6 IMPLANT
PATTIES SURGICAL .5 X1 (DISPOSABLE) ×3 IMPLANT
PATTIES SURGICAL .5X1.5 (GAUZE/BANDAGES/DRESSINGS) ×3 IMPLANT
ROD PRE BENT EXP 40MM (Rod) ×6 IMPLANT
SCREW SET SINGLE INNER (Screw) ×12 IMPLANT
SCREW VIPER CORT FIX 6X35 (Screw) ×12 IMPLANT
SPONGE INTESTINAL PEANUT (DISPOSABLE) ×3 IMPLANT
SPONGE SURGIFOAM ABS GEL 100 (HEMOSTASIS) ×3 IMPLANT
STRIP CLOSURE SKIN 1/2X4 (GAUZE/BANDAGES/DRESSINGS) ×2 IMPLANT
SURGIFLO W/THROMBIN 8M KIT (HEMOSTASIS) IMPLANT
SUT MNCRL AB 4-0 PS2 18 (SUTURE) ×3 IMPLANT
SUT VIC AB 0 CT1 18XCR BRD 8 (SUTURE) ×1 IMPLANT
SUT VIC AB 0 CT1 8-18 (SUTURE) ×2
SUT VIC AB 1 CT1 18XCR BRD 8 (SUTURE) ×1 IMPLANT
SUT VIC AB 1 CT1 8-18 (SUTURE) ×2
SUT VIC AB 2-0 CT2 18 VCP726D (SUTURE) ×3 IMPLANT
SYR 20CC LL (SYRINGE) ×6 IMPLANT
SYR BULB IRRIGATION 50ML (SYRINGE) ×3 IMPLANT
SYR CONTROL 10ML LL (SYRINGE) ×6 IMPLANT
SYR TB 1ML LUER SLIP (SYRINGE) ×3 IMPLANT
TAPE CLOTH SURG 4X10 WHT LF (GAUZE/BANDAGES/DRESSINGS) ×3 IMPLANT
TOWEL OR 17X24 6PK STRL BLUE (TOWEL DISPOSABLE) ×3 IMPLANT
TOWEL OR 17X26 10 PK STRL BLUE (TOWEL DISPOSABLE) ×3 IMPLANT
TRAY FOLEY W/METER SILVER 16FR (SET/KITS/TRAYS/PACK) ×3 IMPLANT
WATER STERILE IRR 1000ML POUR (IV SOLUTION) ×3 IMPLANT
YANKAUER SUCT BULB TIP NO VENT (SUCTIONS) ×3 IMPLANT

## 2016-10-21 NOTE — H&P (Signed)
     PREOPERATIVE H&P  Chief Complaint: Right leg pain  HPI: Meredith Roach is a 76 y.o. 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 Arthritis   . GERD (gastroesophageal reflux disease)   . Pre-diabetes    Past Surgical History:  Procedure Laterality Date  . CHOLECYSTECTOMY  1995  . REDUCTION MAMMAPLASTY Bilateral 1994   Social History   Social History  . Marital status: Married    Spouse name: N/A  . Number of children: N/A  . Years of education: N/A   Social History Main Topics  . Smoking status: Never Smoker  . Smokeless tobacco: Never Used  . Alcohol use No  . Drug use: No  . Sexual activity: Not on file   Other Topics Concern  . Not on file   Social History Narrative  . No narrative on file   Family History  Problem Relation Age of Onset  . Breast cancer Maternal Aunt   . Breast cancer Paternal Aunt    Allergies  Allergen Reactions  . Ace Inhibitors Anaphylaxis   Prior to Admission medications   Medication Sig Start Date End Date Taking? Authorizing Provider  BIOTIN PO Take 1 tablet by mouth daily.   Yes [provider]  CRANBERRY-VITAMIN C PO Take 1 tablet by mouth daily.   Yes [provider]  DULoxetine (CYMBALTA) 60 MG capsule Take 60 mg by mouth daily. 06/12/16 06/12/17 Yes [provider]  Glucosamine-Chondroit-Vit C-Mn (GLUCOSAMINE CHONDR 1500 COMPLX PO) Take 1 tablet by mouth 2 (two) times daily.   Yes [provider]  hydrochlorothiazide (HYDRODIURIL) 25 MG tablet Take 12.5 mg by mouth daily. Takes 0.5 tablet 06/12/16  Yes [provider]  HYDROcodone-acetaminophen (NORCO/VICODIN) 5-325 MG tablet Take 1 tablet by mouth 2 (two)  times daily as needed for moderate pain.   Yes [provider]  ranitidine (ZANTAC) 150 MG tablet Take 300 mg by mouth at bedtime.   Yes [provider]     All other systems have been reviewed and were otherwise negative with the exception of those mentioned in the HPI and as above.  Physical Exam: There were no vitals filed for this visit.  General: Alert, no acute distress Cardiovascular: No pedal edema Respiratory: No cyanosis, no use of accessory musculature Skin: No lesions in the area of chief complaint Neurologic: Sensation intact distally Psychiatric: Patient is competent for consent with normal mood and affect Lymphatic: No axillary or cervical lymphadenopathy  MUSCULOSKELETAL: + SLR on the right  Assessment/Plan: Right leg pain Plan for Procedure(s): RIGHT SIDED LUMBAR 4-5 TRANSFORAMINAL LUMBAR INTERBODY FUSION WITH INSTRUMENTATION AND ALLOGRAFT   Sinclair Ship, MD 10/21/2016 7:29 AM

## 2016-10-21 NOTE — Transfer of Care (Signed)
Immediate Anesthesia Transfer of Care Note  Patient: Meredith Roach  Procedure(s) Performed: Procedure(s) with comments: RIGHT SIDED LUMBAR 4-5 TRANSFORAMINAL LUMBAR INTERBODY FUSION WITH INSTRUMENTATION AND ALLOGRAFT (Right) - RIGHT SIDED LUMBAR 4-5 TRANSFORAMINAL LUMBAR INTERBODY FUSION WITH INSTRUMENTATION AND ALLOGRAFT; REQUEST 3.5 HOURS AND FLIP ROOM  Patient Location: PACU  Anesthesia Type:General  Level of Consciousness: drowsy  Airway & Oxygen Therapy: Patient Spontanous Breathing and Patient connected to face mask oxygen  Post-op Assessment: Report given to RN and Post -op Vital signs reviewed and stable  Post vital signs: Reviewed and stable  Last Vitals:  Vitals:   10/21/16 0944  BP: (!) 137/48  Pulse: 77  Resp: 20  Temp: 36.6 C    Last Pain:  Vitals:   10/21/16 0944  TempSrc: Oral      Patients Stated Pain Goal: 5 (64/15/83 0940)  Complications: No apparent anesthesia complications

## 2016-10-21 NOTE — Anesthesia Postprocedure Evaluation (Signed)
Anesthesia Post Note  Patient: Meredith Roach  Procedure(s) Performed: Procedure(s) (LRB): RIGHT SIDED LUMBAR 4-5 TRANSFORAMINAL LUMBAR INTERBODY FUSION WITH INSTRUMENTATION AND ALLOGRAFT (Right)     Patient location during evaluation: PACU Anesthesia Type: General Level of consciousness: sedated and patient cooperative Pain management: pain level controlled Vital Signs Assessment: post-procedure vital signs reviewed and stable Respiratory status: spontaneous breathing Cardiovascular status: stable Anesthetic complications: no    Last Vitals:  Vitals:   10/21/16 2030 10/21/16 2104  BP:  (!) 165/74  Pulse: 82 82  Resp: 16 16  Temp: 36.4 C 36.6 C    Last Pain:  Vitals:   10/21/16 2208  TempSrc:   PainSc: [place]

## 2016-10-21 NOTE — Anesthesia Preprocedure Evaluation (Addendum)
Anesthesia Evaluation  Patient identified by MRN, date of birth, ID band Patient awake    Reviewed: Allergy & Precautions, NPO status , Patient's Chart, lab work & pertinent test results  Airway Mallampati: III  TM Distance: <3 FB Neck ROM: Full    Dental  (+) Teeth Intact, Dental Advisory Given   Pulmonary    breath sounds clear to auscultation       Cardiovascular  Rhythm:Regular Rate:Normal     Neuro/Psych    GI/Hepatic GERD  Medicated and Controlled,  Endo/Other    Renal/GU      Musculoskeletal  (+) Arthritis ,   Abdominal (+) + obese,   Peds  Hematology   Anesthesia Other Findings   Reproductive/Obstetrics                           Anesthesia Physical Anesthesia Plan  ASA: II  Anesthesia Plan: General   Post-op Pain Management:    Induction: Intravenous  PONV Risk Score and Plan: Ondansetron and Dexamethasone  Airway Management Planned: Oral ETT  Additional Equipment:   Intra-op Plan:   Post-operative Plan: Extubation in OR  Informed Consent: I have reviewed the patients History and Physical, chart, labs and discussed the procedure including the risks, benefits and alternatives for the proposed anesthesia with the patient or authorized representative who has indicated his/her understanding and acceptance.   Dental advisory given and Dental Advisory Given  Plan Discussed with: CRNA, Anesthesiologist and Surgeon  Anesthesia Plan Comments:        Anesthesia Quick Evaluation

## 2016-10-21 NOTE — Anesthesia Procedure Notes (Signed)
Procedure Name: Intubation Date/Time: 10/21/2016 3:35 PM Performed by: Maude Leriche D Pre-anesthesia Checklist: Patient identified, Emergency Drugs available, Suction available, Patient being monitored and Timeout performed Patient Re-evaluated:Patient Re-evaluated prior to induction Oxygen Delivery Method: Circle system utilized Preoxygenation: Pre-oxygenation with 100% oxygen Induction Type: IV induction Ventilation: Mask ventilation without difficulty and Oral airway inserted - appropriate to patient size Laryngoscope Size: Miller and 2 Grade View: Grade III Tube type: Oral Tube size: 7.0 mm Number of attempts: 1 Airway Equipment and Method: Stylet and Bougie stylet Placement Confirmation: ETT inserted through vocal cords under direct vision,  positive ETCO2 and breath sounds checked- equal and bilateral Secured at: 22 cm Tube secured with: Tape Dental Injury: Teeth and Oropharynx as per pre-operative assessment

## 2016-10-22 MED FILL — Sodium Chloride IV Soln 0.9%: INTRAVENOUS | Qty: 1000 | Status: AC

## 2016-10-22 MED FILL — Heparin Sodium (Porcine) Inj 1000 Unit/ML: INTRAMUSCULAR | Qty: 30 | Status: AC

## 2016-10-22 MED FILL — Thrombin For Soln 20000 Unit: CUTANEOUS | Qty: 1 | Status: AC

## 2016-10-22 NOTE — Evaluation (Signed)
Physical Therapy Evaluation Patient Details Name: Meredith Roach MRN: [id number] DOB: [birth year] Today's Date: 10/22/2016   History of Present Illness  Pt is a 76 y/o female who presents s/p L4-L5 PLIF on 10/21/16.  Clinical Impression  Pt admitted with above diagnosis. Pt currently with functional limitations due to the deficits listed below (see PT Problem List). At the time of PT eval pt was able to perform transfers and ambulation with min guard to occasional min assist for balance support and safety. Pt will have adequate support upon d/c from husband and daughters. Pt and husband were educated on brace application/wearing schedule, walking program and activity progression, and general safety with precautions. Pt will benefit from skilled PT to increase their independence and safety with mobility to allow discharge to the venue listed below.       Follow Up Recommendations No PT follow up;Supervision for mobility/OOB    Equipment Recommendations  None recommended by PT    Recommendations for Other Services       Precautions / Restrictions Precautions Precautions: Fall;Back Precaution Booklet Issued: Yes (comment) Precaution Comments: Reviewed handout in detail. Pt was cued for precautions during functional mobility.  Required Braces or Orthoses: Spinal Brace Spinal Brace: Thoracolumbosacral orthotic;Applied in sitting position Restrictions Weight Bearing Restrictions: No      Mobility  Bed Mobility Overal bed mobility: Needs Assistance Bed Mobility: Rolling;Sidelying to Sit Rolling: Modified independent (Device/Increase time) Sidelying to sit: Supervision       General bed mobility comments: Supervision for safety as pt transitioned to EOB. VC's provided for proper log roll technique.   Transfers Overall transfer level: Needs assistance Equipment used: None Transfers: Sit to/from Stand Sit to Stand: Min guard         General transfer comment: Hands-on guarding  as pt powered-up to full standing position. Pt reaching out but did not hold to any support for balance.   Ambulation/Gait Ambulation/Gait assistance: Min guard;Min assist Ambulation Distance (Feet): 350 Feet Assistive device: None Gait Pattern/deviations: Step-through pattern;Decreased stride length;Trendelenburg;Wide base of support Gait velocity: Decreased Gait velocity interpretation: Below normal speed for age/gender General Gait Details: Slow pace with generally flexed posture. Pt was able to make correctional changes but not able to maintain. Pt had 1 LOB in which heavy min assist was provided to recover. pt reports she "lost her footing", and had a lateral LOB.   Stairs Stairs: Yes Stairs assistance: Min assist Stair Management: One rail Right;Step to pattern;Forwards Number of Stairs: 4 General stair comments: Assist provided on L (HHA) and pt held to R railing. Pt first practiced with therapist and then husband, with therapist behind for safety of both the pt and husband.   Wheelchair Mobility    Modified Rankin (Stroke Patients Only)       Balance Overall balance assessment: Needs assistance Sitting-balance support: No upper extremity supported;Feet supported Sitting balance-Leahy Scale: Fair     Standing balance support: No upper extremity supported;During functional activity Standing balance-Leahy Scale: Poor Standing balance comment: 1 LOB in which assist was required.                              Pertinent Vitals/Pain Pain Assessment: No/denies pain Faces Pain Scale: No hurt    Home Living Family/patient expects to be discharged to:: Private residence Living Arrangements: Spouse/significant other Available Help at Discharge: Family;Available 24 hours/day Type of Home: House Home Access: Stairs to enter Entrance Stairs-Rails: Right Entrance Stairs-Number  of Steps: 4 Home Layout: One level Home Equipment: Outlook - 2 wheels;Toilet riser;Shower  seat      Prior Function Level of Independence: Independent               Hand Dominance        Extremity/Trunk Assessment   Upper Extremity Assessment Upper Extremity Assessment: Defer to OT evaluation    Lower Extremity Assessment Lower Extremity Assessment: Generalized weakness    Cervical / Trunk Assessment Cervical / Trunk Assessment: Other exceptions Cervical / Trunk Exceptions: Forward head and rounded shoulder posture with generally flexed trunk when OOB.   Communication   Communication: No difficulties  Cognition Arousal/Alertness: Awake/alert Behavior During Therapy: WFL for tasks assessed/performed Overall Cognitive Status: Within Functional Limits for tasks assessed                                        General Comments      Exercises     Assessment/Plan    PT Assessment Patient needs continued PT services  PT Problem List Decreased strength;Decreased range of motion;Decreased activity tolerance;Decreased balance;Decreased mobility;Decreased knowledge of use of DME;Decreased knowledge of precautions;Decreased safety awareness;Pain       PT Treatment Interventions DME instruction;Gait training;Stair training;Functional mobility training;Therapeutic activities;Therapeutic exercise;Neuromuscular re-education;Patient/family education    PT Goals (Current goals can be found in the Care Plan section)  Acute Rehab PT Goals Patient Stated Goal: Home tomorrow PT Goal Formulation: With patient/family Time For Goal Achievement: 10/29/16 Potential to Achieve Goals: Good    Frequency Min 5X/week   Barriers to discharge        Co-evaluation               AM-PAC PT "6 Clicks" Daily Activity  Outcome Measure Difficulty turning over in bed (including adjusting bedclothes, sheets and blankets)?: None Difficulty moving from lying on back to sitting on the side of the bed? : A Little Difficulty sitting down on and standing up from  a chair with arms (e.g., wheelchair, bedside commode, etc,.)?: A Little Help needed moving to and from a bed to chair (including a wheelchair)?: A Little Help needed walking in hospital room?: A Little Help needed climbing 3-5 steps with a railing? : A Little 6 Click Score: 19    End of Session Equipment Utilized During Treatment: Back brace;Gait belt Activity Tolerance: Patient tolerated treatment well Patient left: in chair;with call bell/phone within reach;with family/visitor present Nurse Communication: Mobility status PT Visit Diagnosis: Unsteadiness on feet (R26.81);Pain Pain - part of body:  (Incision site)    Time: 0938-1829 PT Time Calculation (min) (ACUTE ONLY): 32 min   Charges:   PT Evaluation $PT Eval Moderate Complexity: 1 Procedure PT Treatments $Gait Training: 8-22 mins   PT G Codes:        Rolinda Roan, PT, DPT Acute Rehabilitation Services Pager: [phone]   Thelma Comp 10/22/2016, 12:06 PM

## 2016-10-22 NOTE — Progress Notes (Signed)
Pt doing well. Pt and family given D/C instructions with Rx's, verbal understanding was provided. Pt's incision is clean and dry with no sign of infection. Pt's IV was removed prior to D/C. Pt D/C'd home via wheelchair @ 1215 per MD order. Pt is stable @ D/C and has no other needs at this time. Holli Humbles, RN

## 2016-10-22 NOTE — Progress Notes (Signed)
    Patient doing well, denies leg pain, minimal well controlled LBP, she has been up and walking, very pleased with progress. Slept well, daughter at bedside.    Physical Exam: Vitals:   10/22/16 0004 10/22/16 0400  BP: 131/75 116/65  Pulse: 91 97  Resp: 18 20  Temp: 99 F (37.2 C) 99 F (37.2 C)    Dressing in place, CDI, pt resting comfortably in bed, SCD's in place NVI  POD #1 s/p L4-5 Decompression and Fusion with resolved leg pain and minimal LBP doing wonderful.  - up with PT/OT, encourage ambulation  -TLSO brace at all times when OOB   -D/C home after  PT if cleared - Percocet for pain, Valium for muscle spasms - likely d/c home today after PT/OT

## 2016-10-22 NOTE — Progress Notes (Signed)
Occupational Therapy Evaluation Patient Details Name: Meredith Roach MRN: [id number] DOB: 1941-04-04 Today's Date: 10/22/2016    History of Present Illness Pt is a 76 y/o female who presents s/p L4-L5 PLIF on 10/21/16.   Clinical Impression   Completed all education regarding compensatory techniques for ADL. Functional mobility and reducing risk of falls. Recommend S with all mobility and ADL initially. Pt/family verbalized understanding on all information. Pt safe to DC home with S.    Follow Up Recommendations  No OT follow up;Supervision/Assistance - 24 hour (initially with all mobility adn ADL)    Equipment Recommendations  3 in 1 bedside commode    Recommendations for Other Services       Precautions / Restrictions Precautions Precautions: Fall;Back Precaution Booklet Issued: Yes (comment) Precaution Comments: Reviewed handout in detail. Pt was cued for precautions during functional mobility.  Required Braces or Orthoses: Spinal Brace Spinal Brace: Thoracolumbosacral orthotic;Applied in sitting position Restrictions Weight Bearing Restrictions: No      Mobility Bed Mobility Overal bed mobility: Needs Assistance Bed Mobility: Rolling;Sidelying to Sit Rolling: Modified independent (Device/Increase time) Sidelying to sit: Supervision       General bed mobility comments: OOB in chair  Transfers Overall transfer level: Needs assistance Equipment used: None Transfers: Sit to/from Stand Sit to Stand: Min guard         General transfer comment: unsteady. cues to control sitting and to not fall back into chair    Balance Overall balance assessment: Needs assistance Sitting-balance support: No upper extremity supported;Feet supported Sitting balance-Leahy Scale: Fair     Standing balance support: No upper extremity supported;During functional activity Standing balance-Leahy Scale: Fair Standing balance comment: 1 LOB in which assist was required.                             ADL either performed or assessed with clinical judgement   ADL Overall ADL's : Needs assistance/impaired     Grooming: Set up;Supervision/safety   Upper Body Bathing: Set up;Supervision/ safety;Sitting   Lower Body Bathing: Minimal assistance;Sit to/from stand   Upper Body Dressing : Supervision/safety;Set up;Sitting   Lower Body Dressing: Minimal assistance;Sit to/from stand   Toilet Transfer: Min guard   Toileting- Water quality scientist and Hygiene: Minimal assistance;Sit to/from stand Toileting - Clothing Manipulation Details (indicate cue type and reason): educated on AE for hygiene after toileting   Tub/Shower Transfer Details (indicate cue type and reason): Educated on safe tub transfer technique. Recommend installation of grab bar. Functional mobility during ADLs: Min guard General ADL Comments: Discussed home safety and reducing risk of falls.     Vision         Perception     Praxis      Pertinent Vitals/Pain Pain Assessment: No/denies pain Faces Pain Scale: No hurt     Hand Dominance     Extremity/Trunk Assessment Upper Extremity Assessment Upper Extremity Assessment: Overall WFL for tasks assessed   Lower Extremity Assessment Lower Extremity Assessment: Defer to PT evaluation   Cervical / Trunk Assessment Cervical / Trunk Assessment: Other exceptions Cervical / Trunk Exceptions: Forward head and rounded shoulder posture with generally flexed trunk when OOB.    Communication Communication Communication: No difficulties   Cognition Arousal/Alertness: Awake/alert Behavior During Therapy: WFL for tasks assessed/performed Overall Cognitive Status: Within Functional Limits for tasks assessed  General Comments       Exercises     Shoulder Instructions      Home Living Family/patient expects to be discharged to:: Private residence Living Arrangements:  Spouse/significant other Available Help at Discharge: Family;Available 24 hours/day Type of Home: House Home Access: Stairs to enter [employer] of Steps: 4 Entrance Stairs-Rails: Right Home Layout: One level     Bathroom Shower/Tub: Corporate investment banker: Standard Bathroom Accessibility: Yes How Accessible: Accessible via walker Home Equipment: [place] - 2 wheels;Toilet riser;Shower seat          Prior Functioning/Environment Level of Independence: Independent                 OT Problem List: Decreased strength;Impaired balance (sitting and/or standing);Decreased knowledge of use of DME or AE;Decreased knowledge of precautions;Obesity;Pain      OT Treatment/Interventions:      OT Goals(Current goals can be found in the care plan section) Acute Rehab OT Goals Patient Stated Goal: home today OT Goal Formulation: All assessment and education complete, DC therapy  OT Frequency:     Barriers to D/C:            Co-evaluation              AM-PAC PT "6 Clicks" Daily Activity     Outcome Measure Help from another person eating meals?: None Help from another person taking care of personal grooming?: None Help from another person toileting, which includes using toliet, bedpan, or urinal?: A Little Help from another person bathing (including washing, rinsing, drying)?: A Little Help from another person to put on and taking off regular upper body clothing?: A Little Help from another person to put on and taking off regular lower body clothing?: A Little 6 Click Score: 20   End of Session Equipment Utilized During Treatment: Back brace Nurse Communication: Mobility status  Activity Tolerance: Patient tolerated treatment well Patient left: in chair;with call bell/phone within reach;with family/visitor present  OT Visit Diagnosis: Unsteadiness on feet (R26.81);Muscle weakness (generalized) (M62.81);Pain Pain - part of body:  (back)                 Time: 4967-5916 OT Time Calculation (min): 21 min Charges:  OT General Charges $OT Visit: 1 Procedure OT Evaluation $OT Eval Low Complexity: 1 Procedure G-Codes:     Lamae Fosco, OT/L  [phone] 10/22/2016  Meredith Roach,Meredith Roach 10/22/2016, 12:56 PM

## 2016-10-22 NOTE — Op Note (Signed)
NAME:  Meredith Roach, Meredith Roach NO.:  MEDICAL RECORD NO.:  [id number]  PHYSICIAN:  Phylliss Bob, MD           DATE OF BIRTH:  DATE OF PROCEDURE:  10/21/2016                              OPERATIVE REPORT   PREOPERATIVE DIAGNOSES: 1. Severe spinal stenosis, L4-5. 2. Grade 1 spondylolisthesis, L4-5. 3. Right-sided lumbar radiculopathy.  POSTOPERATIVE DIAGNOSES: 1. Severe spinal stenosis, L4-5. 2. Grade 1 spondylolisthesis, L4-5. 3. Right-sided lumbar radiculopathy.  PROCEDURES: 1. L4-5 decompression with bilateral partial facetectomies and     decompression of the L4-5 level. 2. Right-sided L4-5 transforaminal lumbar interbody fusion. 3. Left-sided L4-5 posterolateral fusion. 4. Insertion of interbody device x1 (11 x 27 mm CONCORDE Bullet cage,     lordotic). 5. Placement of posterior instrumentation at L4, L5 (6 x 35 mm     screws). 6. Use of local autograft. 7. Use of morselized allograft-ViviGen. 8. Intraoperative use of fluoroscopy.  SURGEON:  Phylliss Bob, MD  ASSISTANT:  Pricilla Holm, PA-C.  ANESTHESIA:  General endotracheal anesthesia.  COMPLICATIONS:  None.  DISPOSITION:  Stable.  ESTIMATED BLOOD LOSS:  100 mL.  INDICATIONS FOR SURGERY:  Briefly, Meredith Roach is a very pleasant 76- year-old female, who did present to me with ongoing and severe pain in her right leg.  Her MRI did reveal the findings outlined above.  She did fail appropriate forms of conservative care, and we did discuss proceeding with the procedure reflected above.  The patient was fully aware of the risks and limitations of surgery and did elect to proceed.  OPERATIVE DETAILS:  On October 21, 2016, the patient was brought to surgery and general endotracheal anesthesia was administered.  The patient was placed prone on a well-padded flat Jackson bed with a Wilson frame. Antibiotics were given, and a time-out procedure was performed.  The back was prepped and draped, and a  midline incision was made overlying the L4-5 intervertebral space.  I did decorticate the facet joints bilaterally, as they were very hypertrophic.  Using anatomic landmarks in addition to AP and lateral fluoroscopy, I did cannulate the L4 and L5 pedicles bilaterally.  I did use a medial-to-lateral cortical trajectory technique.  On the left side, I did place 6 x 35 mm screws and a 40 mm rod.  Distraction was applied across the rod and caps were placed.  On the right side, I did place bone wax in the cannulated pedicle holes. At this point, I proceeded with a thorough bilateral partial facetectomies in order to decompress the lateral recess on the right and left sides.  The stenosis was noted to be rather severe and the spinal canal was appropriately decompressed.  I then proceeded with the fusion portion of the procedure.  With an assistant holding medial retraction of the traversing right L5 nerve, I did perform an annulotomy at the posterolateral aspect of the intervertebral space.  I then used a series of paddle scrapers and curettes and pituitary rongeurs to perform a thorough and complete diskectomy.  The endplates were prepared and the intervertebral space was packed with autograft from the decompression as well as with allograft in the form of Galva.  At this point, I did pack the appropriate size intervertebral spacer with allograft and autograft,  which was tamped into position in the usual fashion.  I was very pleased with the press-fit of the implant.  The left posterolateral gutter was then decorticated and additional allograft and autograft were packed into the posterolateral gutter.  I then removed the distraction on the left side and the caps were again tightened.  I then placed 6 x 35 mm screws on the right into the L4 and L5 pedicles and a 40 mm rod. Caps were then placed and all 4 caps were final tightened.  I was very pleased with the final AP and lateral fluoroscopic  images.  I was very pleased with the decompression that I was able to accomplish.  I then explored the wound for any undue bleeding, and there was no substantial bleeding encountered.  The wound was copiously irrigated with a total of approximately 3 L of normal saline throughout the surgery.  The wound was then closed in layers using #1 Vicryl, followed by 2-0 Vicryl, followed by 4-0 Monocryl.  Benzoin and Steri-Strips were applied followed by sterile dressing.  All instrument counts were correct at the termination of the procedure.  Of note, I did use neurologic monitoring throughout the surgery, and there was no abnormal EMG activity noted throughout.  Of note, Pricilla Holm was my assistant throughout surgery and did aid in retraction, suctioning, and closure from start to finish.     Phylliss Bob, MD     MD/MEDQ  D:  10/21/2016  T:  10/22/2016  Job:  [id number]  cc:   Emily Filbert, MD

## 2016-10-28 NOTE — Discharge Summary (Signed)
Patient ID: Meredith Roach MRN: [id number] DOB/AGE: [birth year] 76 y.o.  Admit date: 10/21/2016 Discharge date: 10/22/2016  Admission Diagnoses:  Active Problems:   Radiculopathy   Discharge Diagnoses:  Same  Past Medical History:  Diagnosis Date  . Arthritis   . GERD (gastroesophageal reflux disease)   . Pre-diabetes     Surgeries: Procedure(s): RIGHT SIDED LUMBAR 4-5 TRANSFORAMINAL LUMBAR INTERBODY FUSION WITH INSTRUMENTATION AND ALLOGRAFT on 10/21/2016   Consultants: None  Discharged Condition: Improved  Hospital Course: Meredith Roach is an 76 y.o. female who was admitted 10/21/2016 for operative treatment of radiculopathy. Patient has severe unremitting pain that affects sleep, daily activities, and work/hobbies. After pre-op clearance the patient was taken to the operating room on 10/21/2016 and underwent  Procedure(s): RIGHT SIDED LUMBAR 4-5 TRANSFORAMINAL LUMBAR INTERBODY FUSION WITH INSTRUMENTATION AND ALLOGRAFT.    Patient was given perioperative antibiotics:  Anti-infectives    Start     Dose/Rate Route Frequency Ordered Stop   10/21/16 2200  ceFAZolin (ANCEF) IVPB 2g/100 mL premix     2 g 200 mL/hr over 30 Minutes Intravenous Every 8 hours 10/21/16 2105 10/22/16 0525   10/21/16 0930  ceFAZolin (ANCEF) IVPB 2g/100 mL premix     2 g 200 mL/hr over 30 Minutes Intravenous On call to O.R. 10/21/16 0930 10/21/16 1541       Patient was given sequential compression devices, early ambulation to prevent DVT.  Patient benefited maximally from hospital stay and there were no complications.    Recent vital signs: BP 98/60 (BP Location: Left Arm)   Pulse (!) 103   Temp 99.8 F (37.7 C) (Oral)   Resp 18   Ht 5\' 3"  (1.6 m)   Wt 84.4 kg (186 lb)   SpO2 93%   BMI 32.95 kg/m    Discharge Medications:   Allergies as of 10/22/2016      Reactions   Ace Inhibitors Anaphylaxis      Medication List    STOP taking these medications   HYDROcodone-acetaminophen  5-325 MG tablet Commonly known as:  NORCO/VICODIN     TAKE these medications   BIOTIN PO Take 1 tablet by mouth daily.   CRANBERRY-VITAMIN C PO Take 1 tablet by mouth daily.   DULoxetine 60 MG capsule Commonly known as:  CYMBALTA Take 60 mg by mouth daily.   GLUCOSAMINE CHONDR 1500 COMPLX PO Take 1 tablet by mouth 2 (two) times daily.   hydrochlorothiazide 25 MG tablet Commonly known as:  HYDRODIURIL Take 12.5 mg by mouth daily. Takes 0.5 tablet   ranitidine 150 MG tablet Commonly known as:  ZANTAC Take 300 mg by mouth at bedtime.       Diagnostic Studies: Dg Chest 2 View  Result Date: 10/15/2016 CLINICAL DATA:  Preop for lumbar spine surgery next week EXAM: CHEST  2 VIEW COMPARISON:  Chest x-ray of 11/08/2006 FINDINGS: No active infiltrate or effusion is seen. Mediastinal and hilar contours are unremarkable. The heart is within upper limits of normal. There are diffuse degenerative changes throughout the thoracic spine. IMPRESSION: No active cardiopulmonary disease. Electronically Signed   By: Ivar Drape M.D.   On: 10/15/2016 09:42   Dg Lumbar Spine 2-3 Views  Result Date: 10/21/2016 CLINICAL DATA:  Right-sided L4-5 transforaminal lumbar interbody fusion EXAM: DG C-ARM 61-120 MIN; LUMBAR SPINE - 2-3 VIEW COMPARISON:  MRI lumbar spine dated 09/25/2016 FLUOROSCOPY TIME:  24 seconds FINDINGS: Intraoperative fluoroscopic images during L4-5 PLIF. IMPRESSION: Intraoperative fluoroscopic images during L4-5 PLIF.  Electronically Signed   By: Julian Hy M.D.   On: 10/21/2016 19:07   Dg Lumbar Spine 1 View  Result Date: 10/21/2016 CLINICAL DATA:  Opening image for right-sided lumbar L4-5 transforaminal lumbar interbody fusion EXAM: LUMBAR SPINE - 1 VIEW COMPARISON:  MRI lumbar spine dated 09/25/2016 FINDINGS: Single lateral view of the lumbar spine demonstrates a surgical probe at L2-3 and a surgical probe posterior to the L4 vertebral body. IMPRESSION: Lumbar localization as  above. Electronically Signed   By: Julian Hy M.D.   On: 10/21/2016 19:08   Dg C-arm 61-120 Min  Result Date: 10/21/2016 CLINICAL DATA:  Right-sided L4-5 transforaminal lumbar interbody fusion EXAM: DG C-ARM 61-120 MIN; LUMBAR SPINE - 2-3 VIEW COMPARISON:  MRI lumbar spine dated 09/25/2016 FLUOROSCOPY TIME:  24 seconds FINDINGS: Intraoperative fluoroscopic images during L4-5 PLIF. IMPRESSION: Intraoperative fluoroscopic images during L4-5 PLIF. Electronically Signed   By: Julian Hy M.D.   On: 10/21/2016 19:07    Disposition: 01-Home or Self Care   POD #1 s/p L4-5 Decompression and Fusion with resolved leg pain and minimal LBP doing wonderful. - up with PT/OT, encourage ambulation             -TLSO brace at all times when OOB              -D/C home after  PT  - Percocet for pain, Valium for muscle spasms -Written scripts for pain signed and in chart -D/C instructions sheet printed and in chart -D/C today  -F/U in office 2 weeks   Signed: Justice Britain 10/28/2016, 11:50 AM

## 2017-03-16 ENCOUNTER — Other Ambulatory Visit: Payer: Self-pay | Admitting: Student

## 2017-03-16 DIAGNOSIS — R112 Nausea with vomiting, unspecified: Secondary | ICD-10-CM

## 2017-03-16 DIAGNOSIS — R61 Generalized hyperhidrosis: Secondary | ICD-10-CM

## 2017-03-16 DIAGNOSIS — R197 Diarrhea, unspecified: Secondary | ICD-10-CM

## 2017-03-16 DIAGNOSIS — R1084 Generalized abdominal pain: Secondary | ICD-10-CM

## 2017-03-23 ENCOUNTER — Ambulatory Visit
Admission: RE | Admit: 2017-03-23 | Discharge: 2017-03-23 | Disposition: A | Discharge status: Home / Self Care | Payer: Medicare Other | Source: Ambulatory Visit | Attending: Student | Admitting: Student

## 2017-03-23 DIAGNOSIS — R197 Diarrhea, unspecified: Secondary | ICD-10-CM | POA: Insufficient documentation

## 2017-03-23 DIAGNOSIS — K760 Fatty (change of) liver, not elsewhere classified: Secondary | ICD-10-CM | POA: Insufficient documentation

## 2017-03-23 DIAGNOSIS — R112 Nausea with vomiting, unspecified: Secondary | ICD-10-CM | POA: Insufficient documentation

## 2017-03-23 DIAGNOSIS — R1084 Generalized abdominal pain: Secondary | ICD-10-CM | POA: Insufficient documentation

## 2017-03-23 DIAGNOSIS — R61 Generalized hyperhidrosis: Secondary | ICD-10-CM | POA: Diagnosis not present

## 2017-03-23 DIAGNOSIS — I700 Atherosclerosis of aorta: Secondary | ICD-10-CM | POA: Insufficient documentation

## 2017-03-23 MED ORDER — IOPAMIDOL (ISOVUE-370) INJECTION 76%
100.0000 mL | Freq: Once | INTRAVENOUS | Status: AC | PRN
  Administered 2017-03-23: 100 mL via INTRAVENOUS

## 2017-06-04 ENCOUNTER — Encounter: Payer: Self-pay | Admitting: *Deleted

## 2017-06-07 ENCOUNTER — Ambulatory Visit
Admission: RE | Admit: 2017-06-07 | Discharge: 2017-06-07 | Disposition: A | Discharge status: Home / Self Care | Payer: Medicare Other | Source: Ambulatory Visit | Attending: Unknown Physician Specialty | Admitting: Unknown Physician Specialty

## 2017-06-07 ENCOUNTER — Encounter: Payer: Self-pay | Admitting: Anesthesiology

## 2017-06-07 ENCOUNTER — Encounter
Admission: RE | Disposition: A | Discharge status: Home / Self Care | Payer: Self-pay | Source: Ambulatory Visit | Attending: Unknown Physician Specialty

## 2017-06-07 ENCOUNTER — Ambulatory Visit: Payer: Medicare Other | Admitting: Anesthesiology

## 2017-06-07 DIAGNOSIS — K219 Gastro-esophageal reflux disease without esophagitis: Secondary | ICD-10-CM | POA: Diagnosis not present

## 2017-06-07 DIAGNOSIS — K2970 Gastritis, unspecified, without bleeding: Secondary | ICD-10-CM | POA: Diagnosis not present

## 2017-06-07 DIAGNOSIS — K269 Duodenal ulcer, unspecified as acute or chronic, without hemorrhage or perforation: Secondary | ICD-10-CM | POA: Diagnosis not present

## 2017-06-07 DIAGNOSIS — K317 Polyp of stomach and duodenum: Secondary | ICD-10-CM | POA: Insufficient documentation

## 2017-06-07 DIAGNOSIS — K5730 Diverticulosis of large intestine without perforation or abscess without bleeding: Secondary | ICD-10-CM | POA: Insufficient documentation

## 2017-06-07 DIAGNOSIS — K2980 Duodenitis without bleeding: Secondary | ICD-10-CM | POA: Diagnosis not present

## 2017-06-07 DIAGNOSIS — Z1211 Encounter for screening for malignant neoplasm of colon: Secondary | ICD-10-CM | POA: Insufficient documentation

## 2017-06-07 DIAGNOSIS — K635 Polyp of colon: Secondary | ICD-10-CM | POA: Insufficient documentation

## 2017-06-07 DIAGNOSIS — Z86010 Personal history of colonic polyps: Secondary | ICD-10-CM | POA: Insufficient documentation

## 2017-06-07 DIAGNOSIS — D123 Benign neoplasm of transverse colon: Secondary | ICD-10-CM | POA: Diagnosis not present

## 2017-06-07 DIAGNOSIS — Z79899 Other long term (current) drug therapy: Secondary | ICD-10-CM | POA: Insufficient documentation

## 2017-06-07 DIAGNOSIS — R7303 Prediabetes: Secondary | ICD-10-CM | POA: Diagnosis not present

## 2017-06-07 HISTORY — PX: COLONOSCOPY WITH PROPOFOL: SHX5780

## 2017-06-07 HISTORY — PX: ESOPHAGOGASTRODUODENOSCOPY (EGD) WITH PROPOFOL: SHX5813

## 2017-06-07 SURGERY — COLONOSCOPY WITH PROPOFOL
Anesthesia: General

## 2017-06-07 MED ORDER — FENTANYL CITRATE (PF) 100 MCG/2ML IJ SOLN
INTRAMUSCULAR | Status: AC
  Filled 2017-06-07: qty 2

## 2017-06-07 MED ORDER — SODIUM CHLORIDE 0.9 % IV SOLN: INTRAVENOUS | Status: DC

## 2017-06-07 MED ORDER — PROPOFOL 10 MG/ML IV BOLUS
INTRAVENOUS | Status: DC | PRN
  Administered 2017-06-07: 40 mg via INTRAVENOUS

## 2017-06-07 MED ORDER — SODIUM CHLORIDE 0.9 % IV SOLN
INTRAVENOUS | Status: DC
  Administered 2017-06-07: 08:00:00 via INTRAVENOUS
  Administered 2017-06-07: 1000 mL via INTRAVENOUS

## 2017-06-07 MED ORDER — PROPOFOL 500 MG/50ML IV EMUL
INTRAVENOUS | Status: DC | PRN
  Administered 2017-06-07: 180 ug/kg/min via INTRAVENOUS

## 2017-06-07 MED ORDER — FENTANYL CITRATE (PF) 100 MCG/2ML IJ SOLN
INTRAMUSCULAR | Status: DC | PRN
  Administered 2017-06-07: 50 ug via INTRAVENOUS

## 2017-06-07 MED ORDER — MIDAZOLAM HCL 2 MG/2ML IJ SOLN
INTRAMUSCULAR | Status: AC
  Filled 2017-06-07: qty 2

## 2017-06-07 MED ORDER — MIDAZOLAM HCL 2 MG/2ML IJ SOLN
INTRAMUSCULAR | Status: DC | PRN
  Administered 2017-06-07: 1 mg via INTRAVENOUS

## 2017-06-07 NOTE — Anesthesia Preprocedure Evaluation (Signed)
Anesthesia Evaluation  Patient identified by MRN, date of birth, ID band Patient awake    Reviewed: Allergy & Precautions, H&P , NPO status , Patient's Chart, lab work & pertinent test results  History of Anesthesia Complications Negative for: history of anesthetic complications  Airway Mallampati: III  TM Distance: <3 FB Neck ROM: full    Dental  (+) Chipped, Poor Dentition   Pulmonary neg pulmonary ROS, neg shortness of breath,           Cardiovascular (-) angina(-) Past MI and (-) DOE negative cardio ROS       Neuro/Psych  Neuromuscular disease negative psych ROS   GI/Hepatic Neg liver ROS, GERD  Controlled and Medicated,  Endo/Other  negative endocrine ROS  Renal/GU negative Renal ROS  negative genitourinary   Musculoskeletal  (+) Arthritis ,   Abdominal   Peds  Hematology negative hematology ROS (+)   Anesthesia Other Findings Past Medical History: No date: Arthritis No date: GERD (gastroesophageal reflux disease) No date: Pre-diabetes  Past Surgical History: 1995: CHOLECYSTECTOMY 1994: REDUCTION MAMMAPLASTY; Bilateral  BMI    Body Mass Index:  34.54 kg/m      Reproductive/Obstetrics negative OB ROS                             Anesthesia Physical Anesthesia Plan  ASA: III  Anesthesia Plan: General   Post-op Pain Management:    Induction: Intravenous  PONV Risk Score and Plan: Propofol infusion and TIVA  Airway Management Planned: Natural Airway and Nasal Cannula  Additional Equipment:   Intra-op Plan:   Post-operative Plan:   Informed Consent: I have reviewed the patients History and Physical, chart, labs and discussed the procedure including the risks, benefits and alternatives for the proposed anesthesia with the patient or authorized representative who has indicated his/her understanding and acceptance.   Dental Advisory Given  Plan Discussed with:  Anesthesiologist, CRNA and Surgeon  Anesthesia Plan Comments: (Patient consented for risks of anesthesia including but not limited to:  - adverse reactions to medications - risk of intubation if required - damage to teeth, lips or other oral mucosa - sore throat or hoarseness - Damage to heart, brain, lungs or loss of life  Patient voiced understanding.)        Anesthesia Quick Evaluation

## 2017-06-07 NOTE — Op Note (Addendum)
[facility] Patient Name: Meredith Roach Procedure Date: 06/07/2017 8:19 AM MRN: [id number] Account #: [id number] Date of Birth: 1940-06-28 Admit Type: Outpatient Age: 77 Room: [facility] ENDO ROOM 3 Gender: Female Note Status: Finalized Procedure:            Upper GI endoscopy Indications:          Epigastric abdominal pain Providers:            [name], MD Referring MD:         Rusty Aus, MD (Referring MD) Medicines:            Propofol per Anesthesia Complications:        No immediate complications. Procedure:            Pre-Anesthesia Assessment:                       - After reviewing the risks and benefits, the patient                        was deemed in satisfactory condition to undergo the                        procedure.                       After obtaining informed consent, the endoscope was                        passed under direct vision. Throughout the procedure,                        the patient's blood pressure, pulse, and oxygen                        saturations were monitored continuously. The Endoscope                        was introduced through the mouth, and advanced to the                        second part of duodenum. The upper GI endoscopy was                        accomplished without difficulty. The patient tolerated                        the procedure well. Findings:      The examined esophagus was normal. GEJ at 40cm.      Diffuse and patchy mild inflammation characterized by erythema and       granularity was found in the gastric antrum. Biopsies were taken with a       cold forceps for histology. Biopsies were taken with a cold forceps for       Helicobacter pylori testing.      Localized mildly erythematous mucosa without bleeding was found in the       gastric body. Biopsies were taken with a cold forceps for Helicobacter       pylori testing.      Few non-bleeding superficial duodenal ulcers  and superficial duodenitis       with no stigmata of bleeding were  found in the duodenal bulb. Biopsies       were taken with a cold forceps for histology. Biopsies for histology       were taken with a cold forceps for evaluation of celiac disease.      A single 8 mm sessile polyp with no bleeding was found in the duodenal       bulb. Biopsies were taken with a cold forceps for histology. Impression:           - Normal esophagus.                       - Gastritis. Biopsied.                       - Erythematous mucosa in the gastric body. Biopsied.                       - Multiple non-bleeding duodenal ulcers with no                        stigmata of bleeding. Biopsied.                       - A single duodenal polyp. Biopsied. Recommendation:       - Await pathology results. [name], MD 06/07/2017 8:46:40 AM This report has been signed electronically. Number of Addenda: 0 Note Initiated On: 06/07/2017 8:19 AM      [facility]

## 2017-06-07 NOTE — Op Note (Addendum)
[facility] Gastroenterology Patient Name: Meredith Roach Procedure Date: 06/07/2017 8:18 AM MRN: [id number] Account #: [id number] Date of Birth: 1940-12-22 Admit Type: Outpatient Age: 77 Room: [facility] ENDO ROOM 3 Gender: Female Note Status: Finalized Procedure:            Colonoscopy Indications:          High risk colon cancer surveillance: Personal history                        of colonic polyps Providers:            [name], MD Medicines:            Propofol per Anesthesia Complications:        No immediate complications. Procedure:            Pre-Anesthesia Assessment:                       - After reviewing the risks and benefits, the patient                        was deemed in satisfactory condition to undergo the                        procedure.                       After obtaining informed consent, the colonoscope was                        passed under direct vision. Throughout the procedure,                        the patient's blood pressure, pulse, and oxygen                        saturations were monitored continuously. The                        Colonoscope was introduced through the anus and                        advanced to the the cecum, identified by appendiceal                        orifice and ileocecal valve. The colonoscopy was                        performed without difficulty. The patient tolerated the                        procedure well. The quality of the bowel preparation                        was excellent. Findings:      A diminutive polyp was found in the transverse colon. The polyp was       sessile. The polyp was removed with a jumbo cold forceps. Resection and       retrieval were complete.      Two sessile polyps were found in the recto-sigmoid colon. The polyps       were diminutive in  size. These polyps were removed with a jumbo cold       forceps. Resection and retrieval were complete.      Two sessile,  non-bleeding polyps were found in the recto-sigmoid colon.       The polyps were diminutive in size. These were biopsied with a cold       forceps for histology.      A few small-mouthed diverticula were found in the sigmoid colon.      A diminutive polyp was found in the transverse colon. The polyp was       sessile. The polyp was removed with a jumbo cold forceps. Resection and       retrieval were complete.      Biopsies done of ascending, transverse, descending and sigmoid colon for       microscopic exam due to spells of diarrhea. Impression:           - One diminutive polyp in the transverse colon, removed                        with a jumbo cold forceps. Resected and retrieved.                       - Two diminutive polyps at the recto-sigmoid colon,                        removed with a jumbo cold forceps. Resected and                        retrieved.                       - Two diminutive, non-bleeding polyps at the                        recto-sigmoid colon. Biopsied.                       - Diverticulosis in the sigmoid colon.                       - One diminutive polyp in the transverse colon, removed                        with a jumbo cold forceps. Resected and retrieved. Recommendation:       - Await pathology results. [name], MD 06/07/2017 9:20:28 AM This report has been signed electronically. Number of Addenda: 0 Note Initiated On: 06/07/2017 8:18 AM Scope Withdrawal Time: 0 hours 14 minutes 23 seconds  Total Procedure Duration: 0 hours 22 minutes 14 seconds       [facility]

## 2017-06-07 NOTE — Anesthesia Post-op Follow-up Note (Signed)
Anesthesia QCDR form completed.        

## 2017-06-07 NOTE — H&P (Signed)
Primary Care Physician:  Rusty Aus, MD Primary Gastroenterologist:  Dr. Vira Agar  Pre-Procedure History & Physical: HPI:  Meredith Roach is a 77 y.o. female is here for an endoscopy and colonoscopy. This is for [facility] colon polyps and generalized abdominal pain.   Past Medical History:  Diagnosis Date  . Arthritis   . GERD (gastroesophageal reflux disease)   . Pre-diabetes     Past Surgical History:  Procedure Laterality Date  . CHOLECYSTECTOMY  1995  . REDUCTION MAMMAPLASTY Bilateral 1994    Prior to Admission medications   Medication Sig Start Date End Date Taking? Authorizing Provider  BIOTIN PO Take 1 tablet by mouth daily.   Yes [provider]  CRANBERRY-VITAMIN C PO Take 1 tablet by mouth daily.   Yes [provider]  DULoxetine (CYMBALTA) 60 MG capsule Take 60 mg by mouth daily. 06/12/16 06/12/17 Yes [provider]  Glucosamine-Chondroit-Vit C-Mn (GLUCOSAMINE CHONDR 1500 COMPLX PO) Take 1 tablet by mouth 2 (two) times daily.   Yes [provider]  hydrochlorothiazide (HYDRODIURIL) 25 MG tablet Take 12.5 mg by mouth daily. Takes 0.5 tablet 06/12/16  Yes [provider]  ranitidine (ZANTAC) 150 MG tablet Take 300 mg by mouth at bedtime.   Yes [provider]    Allergies as of 03/25/2017 - Review Complete 10/21/2016  Allergen Reaction Noted  . Ace inhibitors Anaphylaxis 08/09/2013    Family History  Problem Relation Age of Onset  . Breast cancer Maternal Aunt   . Breast cancer Paternal Aunt   . CAD Father   . Heart attack Father     Social History   Socioeconomic History  . Marital status: Married    Spouse name: Not on file  . Number of children: Not on file  . Years of education: Not on file  . Highest education level: Not on file  Social Needs  . Financial resource strain: Not on file  . Food insecurity - worry: Not on file  . Food insecurity - inability: Not on file  . Transportation needs -  medical: Not on file  . Transportation needs - non-medical: Not on file  Occupational History  . Not on file  Tobacco Use  . Smoking status: Never Smoker  . Smokeless tobacco: Never Used  Substance and Sexual Activity  . Alcohol use: No  . Drug use: No  . Sexual activity: Not on file  Other Topics Concern  . Not on file  Social History Narrative  . Not on file    Review of Systems: See HPI, otherwise negative ROS  Physical Exam: BP (!) 146/78   Pulse (!) 116   Temp 98.4 F (36.9 C) (Tympanic)   Resp 20   Ht 5\' 3"  (1.6 m)   Wt 88.5 kg (195 lb)   SpO2 100%   BMI 34.54 kg/m  General:   Alert,  pleasant and cooperative in NAD Head:  Normocephalic and atraumatic. Neck:  Supple; no masses or thyromegaly. Lungs:  Clear throughout to auscultation.    Heart:  Regular rate and rhythm. Abdomen:  Soft, nontender and nondistended. Normal bowel sounds, without guarding, and without rebound.   Neurologic:  Alert and  oriented x4;  grossly normal neurologically.  Impression/Plan: Meredith Roach is here for an endoscopy and colonoscopy to be performed for Personal history of colon polyps and generalized abd pain.  Risks, benefits, limitations, and alternatives regarding  endoscopy and colonoscopy have been reviewed with the patient.  Questions  have been answered.  All parties agreeable.  Patient says that since she started ranitidine that her diarrhea went away.   Gaylyn Cheers, MD  06/07/2017, 8:20 AM

## 2017-06-07 NOTE — Transfer of Care (Signed)
Immediate Anesthesia Transfer of Care Note  Patient: Meredith Roach  Procedure(s) Performed: COLONOSCOPY WITH PROPOFOL (N/A ) ESOPHAGOGASTRODUODENOSCOPY (EGD) WITH PROPOFOL (N/A )  Patient Location: PACU  Anesthesia Type:General  Level of Consciousness: awake  Airway & Oxygen Therapy: Patient Spontanous Breathing and Patient connected to nasal cannula oxygen  Post-op Assessment: Report given to RN and Post -op Vital signs reviewed and stable  Post vital signs: Reviewed and stable  Last Vitals:  Vitals:   06/07/17 0749 06/07/17 0919  BP: (!) 146/78   Pulse: (!) 116   Resp: 20   Temp: 36.9 C (!) 36.3 C  SpO2: 100%     Last Pain:  Vitals:   06/07/17 0919  TempSrc: Tympanic         Complications: No apparent anesthesia complications

## 2017-06-07 NOTE — Anesthesia Postprocedure Evaluation (Signed)
Anesthesia Post Note  Patient: Meredith Roach  Procedure(s) Performed: COLONOSCOPY WITH PROPOFOL (N/A ) ESOPHAGOGASTRODUODENOSCOPY (EGD) WITH PROPOFOL (N/A )  Patient location during evaluation: Endoscopy Anesthesia Type: General Level of consciousness: awake and alert Pain management: pain level controlled Vital Signs Assessment: post-procedure vital signs reviewed and stable Respiratory status: spontaneous breathing, nonlabored ventilation, respiratory function stable and patient connected to nasal cannula oxygen Cardiovascular status: blood pressure returned to baseline and stable Postop Assessment: no apparent nausea or vomiting Anesthetic complications: no     Last Vitals:  Vitals:   06/07/17 0919 06/07/17 0939  BP:  122/73  Pulse:    Resp:    Temp: (!) 36.3 C   SpO2:      Last Pain:  Vitals:   06/07/17 0919  TempSrc: Tympanic                 [name]

## 2017-06-08 ENCOUNTER — Encounter: Payer: Self-pay | Admitting: Unknown Physician Specialty

## 2017-06-08 LAB — SURGICAL PATHOLOGY

## 2017-06-22 ENCOUNTER — Other Ambulatory Visit: Payer: Self-pay | Admitting: Internal Medicine

## 2017-06-22 DIAGNOSIS — Z1231 Encounter for screening mammogram for malignant neoplasm of breast: Secondary | ICD-10-CM

## 2017-07-08 ENCOUNTER — Ambulatory Visit
Admission: RE | Admit: 2017-07-08 | Discharge: 2017-07-08 | Disposition: A | Discharge status: Home / Self Care | Payer: Medicare Other | Source: Ambulatory Visit | Attending: Internal Medicine | Admitting: Internal Medicine

## 2017-07-08 DIAGNOSIS — Z1231 Encounter for screening mammogram for malignant neoplasm of breast: Secondary | ICD-10-CM | POA: Diagnosis not present

## 2018-03-30 ENCOUNTER — Encounter: Payer: Self-pay | Admitting: *Deleted

## 2018-03-30 NOTE — Pre-Procedure Instructions (Signed)
[place]

## 2018-03-31 ENCOUNTER — Ambulatory Visit
Admission: RE | Admit: 2018-03-31 | Discharge: 2018-03-31 | Disposition: A | Discharge status: Home / Self Care | Payer: Medicare Other | Attending: Ophthalmology | Admitting: Ophthalmology

## 2018-03-31 ENCOUNTER — Other Ambulatory Visit: Payer: Self-pay

## 2018-03-31 ENCOUNTER — Encounter: Payer: Self-pay | Admitting: *Deleted

## 2018-03-31 ENCOUNTER — Ambulatory Visit: Payer: Medicare Other | Admitting: Anesthesiology

## 2018-03-31 ENCOUNTER — Encounter
Admission: RE | Disposition: A | Discharge status: Home / Self Care | Payer: Self-pay | Source: Home / Self Care | Attending: Ophthalmology

## 2018-03-31 DIAGNOSIS — M109 Gout, unspecified: Secondary | ICD-10-CM | POA: Insufficient documentation

## 2018-03-31 DIAGNOSIS — Z79899 Other long term (current) drug therapy: Secondary | ICD-10-CM | POA: Diagnosis not present

## 2018-03-31 DIAGNOSIS — I10 Essential (primary) hypertension: Secondary | ICD-10-CM | POA: Insufficient documentation

## 2018-03-31 DIAGNOSIS — E7800 Pure hypercholesterolemia, unspecified: Secondary | ICD-10-CM | POA: Diagnosis not present

## 2018-03-31 DIAGNOSIS — H2511 Age-related nuclear cataract, right eye: Secondary | ICD-10-CM | POA: Insufficient documentation

## 2018-03-31 HISTORY — PX: CATARACT EXTRACTION W/PHACO: SHX586

## 2018-03-31 HISTORY — DX: Gout, unspecified: M10.9

## 2018-03-31 SURGERY — PHACOEMULSIFICATION, CATARACT, WITH IOL INSERTION
Anesthesia: Monitor Anesthesia Care | Site: Eye | Laterality: Right

## 2018-03-31 MED ORDER — PHENYLEPHRINE HCL 10 % OP SOLN
OPHTHALMIC | Status: AC
  Filled 2018-03-31: qty 5

## 2018-03-31 MED ORDER — TETRACAINE HCL 0.5 % OP SOLN
1.0000 [drp] | OPHTHALMIC | Status: DC | PRN
  Administered 2018-03-31 (×2): 1 [drp] via OPHTHALMIC

## 2018-03-31 MED ORDER — PHENYLEPHRINE HCL 2.5 % OP SOLN: 1.0000 [drp] | Freq: Once | OPHTHALMIC | Status: DC

## 2018-03-31 MED ORDER — NA CHONDROIT SULF-NA HYALURON 40-30 MG/ML IO SOLN
INTRAOCULAR | Status: DC | PRN
  Administered 2018-03-31: 0.5 mL via INTRAOCULAR

## 2018-03-31 MED ORDER — FENTANYL CITRATE (PF) 100 MCG/2ML IJ SOLN
INTRAMUSCULAR | Status: DC | PRN
  Administered 2018-03-31 (×2): 50 ug via INTRAVENOUS

## 2018-03-31 MED ORDER — EPINEPHRINE PF 1 MG/ML IJ SOLN
INTRAMUSCULAR | Status: AC
  Filled 2018-03-31: qty 2

## 2018-03-31 MED ORDER — NA HYALUR & NA CHOND-NA HYALUR 0.55-0.5 ML IO KIT
PACK | INTRAOCULAR | Status: DC | PRN
  Administered 2018-03-31: 1 via OPHTHALMIC

## 2018-03-31 MED ORDER — EPINEPHRINE PF 1 MG/ML IJ SOLN
INTRAOCULAR | Status: DC | PRN
  Administered 2018-03-31: 08:00:00 via OPHTHALMIC

## 2018-03-31 MED ORDER — DEXMEDETOMIDINE HCL 200 MCG/2ML IV SOLN
INTRAVENOUS | Status: DC | PRN
  Administered 2018-03-31 (×2): 8 ug via INTRAVENOUS

## 2018-03-31 MED ORDER — LIDOCAINE HCL (PF) 4 % IJ SOLN
INTRAMUSCULAR | Status: AC
  Filled 2018-03-31: qty 5

## 2018-03-31 MED ORDER — PHENYLEPHRINE HCL 10 % OP SOLN: 1.0000 [drp] | Freq: Once | OPHTHALMIC | Status: DC

## 2018-03-31 MED ORDER — TETRACAINE HCL 0.5 % OP SOLN
OPHTHALMIC | Status: AC
  Administered 2018-03-31: 1 [drp] via OPHTHALMIC
  Filled 2018-03-31: qty 4

## 2018-03-31 MED ORDER — FENTANYL CITRATE (PF) 100 MCG/2ML IJ SOLN
INTRAMUSCULAR | Status: AC
  Filled 2018-03-31: qty 2

## 2018-03-31 MED ORDER — CYCLOPENTOLATE HCL 2 % OP SOLN
1.0000 [drp] | OPHTHALMIC | Status: DC | PRN
  Administered 2018-03-31: 1 [drp] via OPHTHALMIC

## 2018-03-31 MED ORDER — POVIDONE-IODINE 5 % OP SOLN
OPHTHALMIC | Status: AC
  Filled 2018-03-31: qty 60

## 2018-03-31 MED ORDER — BSS IO SOLN
INTRAOCULAR | Status: DC | PRN
  Administered 2018-03-31: 15 mL via INTRAOCULAR

## 2018-03-31 MED ORDER — LIDOCAINE HCL (PF) 4 % IJ SOLN
INTRAOCULAR | Status: DC | PRN
  Administered 2018-03-31: 4 mL via OPHTHALMIC

## 2018-03-31 MED ORDER — MOXIFLOXACIN HCL 0.5 % OP SOLN
OPHTHALMIC | Status: DC | PRN
  Administered 2018-03-31: 0.2 mL via OPHTHALMIC

## 2018-03-31 MED ORDER — ARMC OPHTHALMIC DILATING DROPS
OPHTHALMIC | Status: AC
  Administered 2018-03-31: 1 via OPHTHALMIC
  Filled 2018-03-31: qty 0.5

## 2018-03-31 MED ORDER — MOXIFLOXACIN HCL 0.5 % OP SOLN
OPHTHALMIC | Status: AC
  Filled 2018-03-31: qty 3

## 2018-03-31 MED ORDER — TRYPAN BLUE 0.06 % OP SOLN
OPHTHALMIC | Status: DC | PRN
  Administered 2018-03-31: 0.5 mL via INTRAOCULAR

## 2018-03-31 MED ORDER — MOXIFLOXACIN HCL 0.5 % OP SOLN: 1.0000 [drp] | OPHTHALMIC | Status: DC | PRN

## 2018-03-31 MED ORDER — TRYPAN BLUE 0.06 % OP SOLN
OPHTHALMIC | Status: AC
  Filled 2018-03-31: qty 0.5

## 2018-03-31 MED ORDER — NA HYALUR & NA CHOND-NA HYALUR 0.55-0.5 ML IO KIT
PACK | INTRAOCULAR | Status: AC
  Filled 2018-03-31: qty 1.05

## 2018-03-31 MED ORDER — CYCLOPENTOLATE HCL 2 % OP SOLN
OPHTHALMIC | Status: AC
  Filled 2018-03-31: qty 2

## 2018-03-31 MED ORDER — POVIDONE-IODINE 5 % OP SOLN
OPHTHALMIC | Status: DC | PRN
  Administered 2018-03-31: 1 via OPHTHALMIC

## 2018-03-31 MED ORDER — ARMC OPHTHALMIC DILATING DROPS
1.0000 "application " | OPHTHALMIC | Status: AC
  Administered 2018-03-31 (×2): 1 via OPHTHALMIC

## 2018-03-31 MED ORDER — SODIUM CHLORIDE 0.9 % IV SOLN
INTRAVENOUS | Status: DC
  Administered 2018-03-31: 07:00:00 via INTRAVENOUS

## 2018-03-31 MED ORDER — GLYCOPYRROLATE 0.2 MG/ML IJ SOLN
INTRAMUSCULAR | Status: AC
  Filled 2018-03-31: qty 1

## 2018-03-31 SURGICAL SUPPLY — 18 items
DISSECTOR HYDRO NUCLEUS 50X22 (MISCELLANEOUS) ×12 IMPLANT
GLOVE BIOGEL M 6.5 STRL (GLOVE) ×3 IMPLANT
GOWN STRL REUS W/ TWL LRG LVL3 (GOWN DISPOSABLE) ×1 IMPLANT
GOWN STRL REUS W/ TWL XL LVL3 (GOWN DISPOSABLE) ×1 IMPLANT
GOWN STRL REUS W/TWL LRG LVL3 (GOWN DISPOSABLE) ×2
GOWN STRL REUS W/TWL XL LVL3 (GOWN DISPOSABLE) ×2
KNIFE 45D UP 2.3 (MISCELLANEOUS) ×3 IMPLANT
LABEL CATARACT MEDS ST (LABEL) ×3 IMPLANT
LENS IOL ACRSF IQ ULTRA 23.5 (Intraocular Lens) ×1 IMPLANT
LENS IOL ACRYSOF IQ 23.5 (Intraocular Lens) ×3 IMPLANT
PACK CATARACT (MISCELLANEOUS) ×3 IMPLANT
PACK CATARACT KING (MISCELLANEOUS) ×3 IMPLANT
PACK EYE AFTER SURG (MISCELLANEOUS) ×3 IMPLANT
SOL BSS BAG (MISCELLANEOUS) ×3
SOLUTION BSS BAG (MISCELLANEOUS) ×1 IMPLANT
SYR 5ML LL (SYRINGE) ×3 IMPLANT
WATER STERILE IRR 250ML POUR (IV SOLUTION) ×3 IMPLANT
WIPE NON LINTING 3.25X3.25 (MISCELLANEOUS) ×3 IMPLANT

## 2018-03-31 NOTE — OR Nursing (Signed)
Discussed discharge instructions with patient and husband  Both voiced understanding.

## 2018-03-31 NOTE — H&P (Signed)
   I have reviewed the patient's H&P and agree with its findings. There have been no interval changes.  Ashford Clouse MD Ophthalmology 

## 2018-03-31 NOTE — Discharge Instructions (Signed)
Eye Surgery Discharge Instructions    Expect mild scratchy sensation or mild soreness. DO NOT RUB YOUR EYE!  The day of surgery:  Minimal physical activity, but bed rest is not required  No reading, computer work, or close hand work  No bending, lifting, or straining.  May watch TV  For 24 hours:  No driving, legal decisions, or alcoholic beverages  Safety precautions  Eat anything you prefer: It is better to start with liquids, then soup then solid foods.  _____ Eye patch should be worn until postoperative exam tomorrow.  ____ Solar shield eyeglasses should be worn for comfort in the sunlight/patch while sleeping  Resume all regular medications including aspirin or Coumadin if these were discontinued prior to surgery. You may shower, bathe, shave, or wash your hair. Tylenol may be taken for mild discomfort.  Call your doctor if you experience significant pain, nausea, or vomiting, fever > 101 or other signs of infection. [phone] or [phone] Specific instructions:  Follow-up Information    [name], MD Follow up.   Specialty:  Ophthalmology Why:  December 20 at [facility] information: [address] [place] [address] [phone]

## 2018-03-31 NOTE — Transfer of Care (Signed)
Immediate Anesthesia Transfer of Care Note  Patient: Meredith Roach  Procedure(s) Performed: CATARACT EXTRACTION PHACO AND INTRAOCULAR LENS PLACEMENT (IOC) (Right Eye)  Patient Location: PACU  Anesthesia Type:MAC  Level of Consciousness: awake, alert  and oriented  Airway & Oxygen Therapy: Patient Spontanous Breathing  Post-op Assessment: Report given to RN and Post -op Vital signs reviewed and stable  Post vital signs: Reviewed and stable  Last Vitals:  Vitals Value Taken Time  BP 109/56 03/31/2018  9:13 AM  Temp 36.9 C 03/31/2018  9:13 AM  Pulse 80 03/31/2018  9:13 AM  Resp 16 03/31/2018  9:13 AM  SpO2 96 % 03/31/2018  9:13 AM    Last Pain:  Vitals:   03/31/18 0913  TempSrc: Oral  PainSc: 0-No pain         Complications: No anesthetic complications

## 2018-03-31 NOTE — Anesthesia Post-op Follow-up Note (Signed)
Anesthesia QCDR form completed.        

## 2018-03-31 NOTE — Op Note (Signed)
  PREOPERATIVE DIAGNOSIS:  Nuclear sclerotic cataract of the RIGHT eye.   POSTOPERATIVE DIAGNOSIS:  Nuclear sclerotic cataract of the RIGHT eye.   OPERATIVE PROCEDURE: Cataract surgery OD   SURGEON:  [name], MD.   ANESTHESIA:  Anesthesiologist: Gunnar Bulla, MD CRNA: Jonna Clark, CRNA  1.      Managed anesthesia care. 2.     0.17ml of Shugarcaine was instilled following the paracentesis   COMPLICATIONS:  None.   TECHNIQUE:   Divide and conquer   DESCRIPTION OF PROCEDURE:  The patient was examined and consented in the preoperative holding area where the aforementioned topical anesthesia was applied to the RIGHT eye and then brought back to the Operating Room where the left eye was prepped and draped in the usual sterile ophthalmic fashion and a lid speculum was placed. A paracentesis was created with the side port blade, the anterior chamber was washed out with trypan blue to stain the anterior capsule, and the anterior chamber was filled with viscoelastic. A near clear corneal incision was performed with the steel keratome. A continuous curvilinear capsulorrhexis was performed with a cystotome followed by the capsulorrhexis forceps. Hydrodissection and hydrodelineation were carried out with BSS on a blunt cannula. The lens was removed in a divide and conquer  technique and the remaining cortical material was removed with the irrigation-aspiration handpiece. The capsular bag was inflated with viscoelastic and the lens was placed in the capsular bag without complication. The remaining viscoelastic was removed from the eye with the irrigation-aspiration handpiece. The wounds were hydrated. The anterior chamber was flushed and the eye was inflated to physiologic pressure. 0.12ml Vigamox was placed in the anterior chamber. The wounds were found to be water tight. The eye was dressed with Vigamox. The patient was given protective glasses to wear throughout the day and a shield with which to  sleep tonight. The patient was also given drops with which to begin a drop regimen today and will follow-up with me in one day. Implant Name Type Inv. Item Serial No. Manufacturer Lot No. LRB No. Used  LENS IOL ACRYSOF IQ 23.5 - [id number] Intraocular Lens LENS IOL ACRYSOF IQ 23.5 [id number] ALCON  Right 1    Procedure(s) with comments: CATARACT EXTRACTION PHACO AND INTRAOCULAR LENS PLACEMENT (IOC) (Right) - [place] 00:39.7 CDE 4.35 Fluid Pack Lot # [id number]  Electronically signed: [name] 03/31/2018 9:11 AM

## 2018-03-31 NOTE — Anesthesia Postprocedure Evaluation (Signed)
Anesthesia Post Note  Patient: Meredith Roach  Procedure(s) Performed: CATARACT EXTRACTION PHACO AND INTRAOCULAR LENS PLACEMENT (IOC) (Right Eye)  Patient location during evaluation: Short Stay Anesthesia Type: MAC Level of consciousness: awake, awake and alert and oriented Pain management: pain level controlled Vital Signs Assessment: post-procedure vital signs reviewed and stable Respiratory status: spontaneous breathing Cardiovascular status: stable Postop Assessment: no headache, adequate PO intake and no apparent nausea or vomiting Anesthetic complications: no     Last Vitals:  Vitals:   03/31/18 0613 03/31/18 0913  BP: 123/75 (!) 109/56  Pulse: 90 80  Resp: 16 16  Temp: 37.1 C 36.9 C  SpO2: 98% 96%    Last Pain:  Vitals:   03/31/18 0913  TempSrc: Oral  PainSc: 0-No pain                 Lanora Manis

## 2018-03-31 NOTE — Anesthesia Preprocedure Evaluation (Signed)
Anesthesia Evaluation  Patient identified by MRN, date of birth, ID band Patient awake    Reviewed: Allergy & Precautions, NPO status , Patient's Chart, lab work & pertinent test results, reviewed documented beta blocker date and time   Airway Mallampati: II  TM Distance: >3 FB     Dental  (+) Chipped   Pulmonary           Cardiovascular      Neuro/Psych  Neuromuscular disease    GI/Hepatic GERD  Controlled,  Endo/Other    Renal/GU      Musculoskeletal  (+) Arthritis ,   Abdominal   Peds  Hematology   Anesthesia Other Findings EKG ok.  Reproductive/Obstetrics                             Anesthesia Physical Anesthesia Plan  ASA: III  Anesthesia Plan: MAC   Post-op Pain Management:    Induction:   PONV Risk Score and Plan:   Airway Management Planned:   Additional Equipment:   Intra-op Plan:   Post-operative Plan:   Informed Consent: I have reviewed the patients History and Physical, chart, labs and discussed the procedure including the risks, benefits and alternatives for the proposed anesthesia with the patient or authorized representative who has indicated his/her understanding and acceptance.     Plan Discussed with: CRNA  Anesthesia Plan Comments:         Anesthesia Quick Evaluation

## 2018-05-11 ENCOUNTER — Other Ambulatory Visit: Payer: Self-pay | Admitting: Student

## 2018-05-11 DIAGNOSIS — K760 Fatty (change of) liver, not elsewhere classified: Secondary | ICD-10-CM

## 2018-05-17 ENCOUNTER — Ambulatory Visit
Admission: RE | Admit: 2018-05-17 | Discharge: 2018-05-17 | Disposition: A | Discharge status: Home / Self Care | Payer: Medicare Other | Source: Ambulatory Visit | Attending: Student | Admitting: Student

## 2018-05-17 DIAGNOSIS — K760 Fatty (change of) liver, not elsewhere classified: Secondary | ICD-10-CM | POA: Diagnosis not present

## 2018-05-30 ENCOUNTER — Other Ambulatory Visit
Admission: RE | Admit: 2018-05-30 | Discharge: 2018-05-30 | Disposition: A | Discharge status: Home / Self Care | Payer: Medicare Other | Source: Ambulatory Visit | Attending: Student | Admitting: Student

## 2018-05-30 DIAGNOSIS — R197 Diarrhea, unspecified: Secondary | ICD-10-CM | POA: Insufficient documentation

## 2018-05-30 LAB — GASTROINTESTINAL PANEL BY PCR, STOOL (REPLACES STOOL CULTURE)
Adenovirus F40/41: NOT DETECTED
Astrovirus: NOT DETECTED
Campylobacter species: NOT DETECTED
Cryptosporidium: NOT DETECTED
Cyclospora cayetanensis: NOT DETECTED
Entamoeba histolytica: NOT DETECTED
Enteroaggregative E coli (EAEC): NOT DETECTED
Enteropathogenic E coli (EPEC): NOT DETECTED
Enterotoxigenic E coli (ETEC): NOT DETECTED
Giardia lamblia: NOT DETECTED
Norovirus GI/GII: DETECTED — AB
Plesimonas shigelloides: NOT DETECTED
Rotavirus A: NOT DETECTED
Salmonella species: NOT DETECTED
Sapovirus (I, II, IV, and V): NOT DETECTED
Shiga like toxin producing E coli (STEC): NOT DETECTED
Shigella/Enteroinvasive E coli (EIEC): NOT DETECTED
VIBRIO SPECIES: NOT DETECTED
Vibrio cholerae: NOT DETECTED
Yersinia enterocolitica: NOT DETECTED

## 2018-05-31 LAB — CALPROTECTIN, FECAL: Calprotectin, Fecal: 87 ug/g (ref 0–120)

## 2018-06-01 LAB — PANCREATIC ELASTASE, FECAL

## 2018-06-09 LAB — MISC LABCORP TEST (SEND OUT): Labcorp test code: 86207

## 2018-08-17 ENCOUNTER — Other Ambulatory Visit: Payer: Self-pay | Admitting: Internal Medicine

## 2018-08-17 DIAGNOSIS — Z1231 Encounter for screening mammogram for malignant neoplasm of breast: Secondary | ICD-10-CM

## 2018-10-13 ENCOUNTER — Other Ambulatory Visit: Payer: Self-pay

## 2018-10-13 ENCOUNTER — Ambulatory Visit
Admission: RE | Admit: 2018-10-13 | Discharge: 2018-10-13 | Disposition: A | Discharge status: Home / Self Care | Payer: Medicare Other | Source: Ambulatory Visit | Attending: Internal Medicine | Admitting: Internal Medicine

## 2018-10-13 DIAGNOSIS — Z1231 Encounter for screening mammogram for malignant neoplasm of breast: Secondary | ICD-10-CM | POA: Insufficient documentation

## 2019-07-10 ENCOUNTER — Ambulatory Visit: Payer: Medicare Other | Admitting: Gastroenterology

## 2019-08-26 ENCOUNTER — Emergency Department (HOSPITAL_COMMUNITY): Payer: Medicare PPO

## 2019-08-26 ENCOUNTER — Inpatient Hospital Stay (HOSPITAL_COMMUNITY)
Admission: EM | Admit: 2019-08-26 | Discharge: 2019-08-31 | DRG: 028 | Disposition: A | Discharge status: Rehabilitation Facility | Payer: Medicare PPO | Attending: Neurological Surgery | Admitting: Neurological Surgery

## 2019-08-26 DIAGNOSIS — S14129A Central cord syndrome at unspecified level of cervical spinal cord, initial encounter: Secondary | ICD-10-CM

## 2019-08-26 DIAGNOSIS — M4802 Spinal stenosis, cervical region: Secondary | ICD-10-CM | POA: Diagnosis present

## 2019-08-26 DIAGNOSIS — M109 Gout, unspecified: Secondary | ICD-10-CM | POA: Diagnosis present

## 2019-08-26 DIAGNOSIS — N319 Neuromuscular dysfunction of bladder, unspecified: Secondary | ICD-10-CM | POA: Diagnosis present

## 2019-08-26 DIAGNOSIS — S14123A 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, initial encounter: Secondary | ICD-10-CM | POA: Diagnosis present

## 2019-08-26 DIAGNOSIS — M1990 Unspecified osteoarthritis, unspecified site: Secondary | ICD-10-CM | POA: Diagnosis present

## 2019-08-26 DIAGNOSIS — Z981 Arthrodesis status: Secondary | ICD-10-CM

## 2019-08-26 DIAGNOSIS — Z8249 Family history of ischemic heart disease and other diseases of the circulatory system: Secondary | ICD-10-CM

## 2019-08-26 DIAGNOSIS — K219 Gastro-esophageal reflux disease without esophagitis: Secondary | ICD-10-CM | POA: Diagnosis present

## 2019-08-26 DIAGNOSIS — R7303 Prediabetes: Secondary | ICD-10-CM | POA: Diagnosis present

## 2019-08-26 DIAGNOSIS — Z419 Encounter for procedure for purposes other than remedying health state, unspecified: Secondary | ICD-10-CM

## 2019-08-26 DIAGNOSIS — W1830XA Fall on same level, unspecified, initial encounter: Secondary | ICD-10-CM | POA: Diagnosis present

## 2019-08-26 DIAGNOSIS — M792 Neuralgia and neuritis, unspecified: Secondary | ICD-10-CM | POA: Diagnosis not present

## 2019-08-26 DIAGNOSIS — G9520 Unspecified cord compression: Secondary | ICD-10-CM | POA: Diagnosis present

## 2019-08-26 DIAGNOSIS — R739 Hyperglycemia, unspecified: Secondary | ICD-10-CM | POA: Diagnosis not present

## 2019-08-26 DIAGNOSIS — G8250 Quadriplegia, unspecified: Secondary | ICD-10-CM | POA: Diagnosis present

## 2019-08-26 DIAGNOSIS — Z803 Family history of malignant neoplasm of breast: Secondary | ICD-10-CM

## 2019-08-26 DIAGNOSIS — R339 Retention of urine, unspecified: Secondary | ICD-10-CM | POA: Diagnosis present

## 2019-08-26 DIAGNOSIS — Z20822 Contact with and (suspected) exposure to covid-19: Secondary | ICD-10-CM | POA: Diagnosis present

## 2019-08-26 DIAGNOSIS — G8252 Quadriplegia, C1-C4 incomplete: Secondary | ICD-10-CM | POA: Diagnosis present

## 2019-08-26 DIAGNOSIS — S14123D 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, subsequent encounter: Secondary | ICD-10-CM | POA: Diagnosis not present

## 2019-08-26 DIAGNOSIS — M7989 Other specified soft tissue disorders: Secondary | ICD-10-CM | POA: Diagnosis not present

## 2019-08-26 DIAGNOSIS — Z888 Allergy status to other drugs, medicaments and biological substances status: Secondary | ICD-10-CM | POA: Diagnosis not present

## 2019-08-26 DIAGNOSIS — K592 Neurogenic bowel, not elsewhere classified: Secondary | ICD-10-CM | POA: Diagnosis present

## 2019-08-26 DIAGNOSIS — E875 Hyperkalemia: Secondary | ICD-10-CM | POA: Diagnosis not present

## 2019-08-26 DIAGNOSIS — T380X5A Adverse effect of glucocorticoids and synthetic analogues, initial encounter: Secondary | ICD-10-CM | POA: Diagnosis not present

## 2019-08-26 LAB — CBC WITH DIFFERENTIAL/PLATELET
Abs Immature Granulocytes: 0.04 10*3/uL (ref 0.00–0.07)
Basophils Absolute: 0 10*3/uL (ref 0.0–0.1)
Basophils Relative: 0 %
Eosinophils Absolute: 0.1 10*3/uL (ref 0.0–0.5)
Eosinophils Relative: 1 %
HCT: 42.6 % (ref 36.0–46.0)
Hemoglobin: 13.7 g/dL (ref 12.0–15.0)
Immature Granulocytes: 1 %
Lymphocytes Relative: 21 %
Lymphs Abs: 1.4 10*3/uL (ref 0.7–4.0)
MCH: 28.3 pg (ref 26.0–34.0)
MCHC: 32.2 g/dL (ref 30.0–36.0)
MCV: 88 fL (ref 80.0–100.0)
Monocytes Absolute: 0.4 10*3/uL (ref 0.1–1.0)
Monocytes Relative: 6 %
Neutro Abs: 4.9 10*3/uL (ref 1.7–7.7)
Neutrophils Relative %: 71 %
Platelets: 237 10*3/uL (ref 150–400)
RBC: 4.84 MIL/uL (ref 3.87–5.11)
RDW: 12.9 % (ref 11.5–15.5)
WBC: 6.9 10*3/uL (ref 4.0–10.5)
nRBC: 0 % (ref 0.0–0.2)

## 2019-08-26 LAB — COMPREHENSIVE METABOLIC PANEL
ALT: 15 U/L (ref 0–44)
AST: 30 U/L (ref 15–41)
Albumin: 3.6 g/dL (ref 3.5–5.0)
Alkaline Phosphatase: 62 U/L (ref 38–126)
Anion gap: 12 (ref 5–15)
BUN: 16 mg/dL (ref 8–23)
CO2: 23 mmol/L (ref 22–32)
Calcium: 8.9 mg/dL (ref 8.9–10.3)
Chloride: 104 mmol/L (ref 98–111)
Creatinine, Ser: 0.86 mg/dL (ref 0.44–1.00)
GFR calc Af Amer: >60 mL/min (ref >60)
GFR calc non Af Amer: >60 mL/min (ref >60)
Glucose, Bld: 123 mg/dL — ABNORMAL HIGH (ref 70–99)
Potassium: 3.9 mmol/L (ref 3.5–5.1)
Sodium: 139 mmol/L (ref 135–145)
Total Bilirubin: 0.9 mg/dL (ref 0.3–1.2)
Total Protein: 6.3 g/dL — ABNORMAL LOW (ref 6.5–8.1)

## 2019-08-26 LAB — TYPE AND SCREEN
ABO/RH(D): A POS
Antibody Screen: NEGATIVE

## 2019-08-26 LAB — CK: Total CK: 93 U/L (ref 38–234)

## 2019-08-26 LAB — SARS CORONAVIRUS 2 BY RT PCR (HOSPITAL ORDER, PERFORMED IN ~~LOC~~ HOSPITAL LAB): SARS Coronavirus 2: NEGATIVE

## 2019-08-26 MED ORDER — METHOCARBAMOL 1000 MG/10ML IJ SOLN
500.0000 mg | Freq: Four times a day (QID) | INTRAVENOUS | Status: DC | PRN
  Administered 2019-08-28 (×2): 500 mg via INTRAVENOUS
  Filled 2019-08-26 (×2): qty 5

## 2019-08-26 MED ORDER — ONDANSETRON HCL 4 MG/2ML IJ SOLN: 4.0000 mg | Freq: Four times a day (QID) | INTRAMUSCULAR | Status: DC | PRN

## 2019-08-26 MED ORDER — DEXAMETHASONE SODIUM PHOSPHATE 4 MG/ML IJ SOLN
4.0000 mg | Freq: Four times a day (QID) | INTRAMUSCULAR | Status: DC
  Administered 2019-08-28 (×2): 4 mg via INTRAVENOUS
  Filled 2019-08-26 (×3): qty 1

## 2019-08-26 MED ORDER — SODIUM CHLORIDE 0.9% FLUSH: 3.0000 mL | INTRAVENOUS | Status: DC | PRN

## 2019-08-26 MED ORDER — SODIUM CHLORIDE 0.9 % IV SOLN: 250.0000 mL | INTRAVENOUS | Status: DC

## 2019-08-26 MED ORDER — DEXAMETHASONE SODIUM PHOSPHATE 10 MG/ML IJ SOLN
10.0000 mg | Freq: Once | INTRAMUSCULAR | Status: AC
  Administered 2019-08-26: 10 mg via INTRAVENOUS
  Filled 2019-08-26: qty 1

## 2019-08-26 MED ORDER — POTASSIUM CHLORIDE IN NACL 20-0.9 MEQ/L-% IV SOLN
INTRAVENOUS | Status: DC
  Filled 2019-08-26 (×4): qty 1000

## 2019-08-26 MED ORDER — OXYCODONE HCL 5 MG PO TABS
5.0000 mg | ORAL_TABLET | ORAL | Status: DC | PRN
  Administered 2019-08-26 – 2019-08-27 (×3): 5 mg via ORAL
  Filled 2019-08-26 (×3): qty 1

## 2019-08-26 MED ORDER — DEXAMETHASONE 4 MG PO TABS
4.0000 mg | ORAL_TABLET | Freq: Four times a day (QID) | ORAL | Status: DC
  Administered 2019-08-27 (×3): 4 mg via ORAL
  Filled 2019-08-26 (×3): qty 1

## 2019-08-26 MED ORDER — ACETAMINOPHEN 325 MG PO TABS
650.0000 mg | ORAL_TABLET | ORAL | Status: DC | PRN
  Administered 2019-08-26: 650 mg via ORAL
  Filled 2019-08-26 (×2): qty 2

## 2019-08-26 MED ORDER — ACETAMINOPHEN 650 MG RE SUPP: 650.0000 mg | RECTAL | Status: DC | PRN

## 2019-08-26 MED ORDER — MORPHINE SULFATE (PF) 2 MG/ML IV SOLN: 2.0000 mg | INTRAVENOUS | Status: DC | PRN

## 2019-08-26 MED ORDER — HYDROCHLOROTHIAZIDE 25 MG PO TABS: 12.5000 mg | ORAL_TABLET | Freq: Every day | ORAL | Status: DC

## 2019-08-26 MED ORDER — PANTOPRAZOLE SODIUM 40 MG PO TBEC
40.0000 mg | DELAYED_RELEASE_TABLET | Freq: Every day | ORAL | Status: DC
  Administered 2019-08-27: 40 mg via ORAL
  Filled 2019-08-26 (×2): qty 1

## 2019-08-26 MED ORDER — SENNA 8.6 MG PO TABS
1.0000 | ORAL_TABLET | Freq: Two times a day (BID) | ORAL | Status: DC
  Administered 2019-08-26 – 2019-08-27 (×3): 8.6 mg via ORAL
  Filled 2019-08-26 (×4): qty 1

## 2019-08-26 MED ORDER — DEXAMETHASONE 4 MG PO TABS: 4.0000 mg | ORAL_TABLET | Freq: Four times a day (QID) | ORAL | Status: DC

## 2019-08-26 MED ORDER — ONDANSETRON HCL 4 MG PO TABS: 4.0000 mg | ORAL_TABLET | Freq: Four times a day (QID) | ORAL | Status: DC | PRN

## 2019-08-26 MED ORDER — DEXAMETHASONE SODIUM PHOSPHATE 4 MG/ML IJ SOLN: 4.0000 mg | Freq: Four times a day (QID) | INTRAMUSCULAR | Status: DC

## 2019-08-26 MED ORDER — SODIUM CHLORIDE 0.9% FLUSH
3.0000 mL | Freq: Two times a day (BID) | INTRAVENOUS | Status: DC
  Administered 2019-08-26: 3 mL via INTRAVENOUS

## 2019-08-26 MED ORDER — METHOCARBAMOL 500 MG PO TABS
500.0000 mg | ORAL_TABLET | Freq: Four times a day (QID) | ORAL | Status: DC | PRN
  Administered 2019-08-27 (×2): 500 mg via ORAL
  Filled 2019-08-26 (×2): qty 1

## 2019-08-26 NOTE — ED Triage Notes (Signed)
Pt to ED via EMS from home c/o ground level fall. New neurological deficits. Pt reports new numbness to left hand, unable to grab with hands. Also no resistance to gravity legs bilat, but does have reflexes . No medications given by EMS- A&O X 4. Pt denies pain, no obvious injuries noted by EMS. Last VS 97.7 92%RA, 108/77 HR 77.

## 2019-08-26 NOTE — ED Notes (Signed)
Pt transported to MRI 

## 2019-08-26 NOTE — ED Notes (Signed)
Pt gives verbal consent for this nurse to talk to her daughter, noted that pt daughter called requesting an update. This nurse called daughter per pt request.

## 2019-08-26 NOTE — H&P (Signed)
Subjective:   Patient is a 79 y.o. female presented to the ED this afternoon with complaints of numbness in her upper extremities over the last 3 weeks. Reports that she was out gardening when she suddenly fell and couldn't move any of her extremities. EMS was called and she was brought to the hospital. Since being in the ED she does report some improvement in her weakness. She is now able to lift up her arms off of the bed which she was unable to do earlier. She reports and "electrical shock" like pain in her arms. Denies any neck pain. She has had a lumbar fusion in 2018 by Dr. Lynann Bologna. Denies any pain in her legs.   Past Medical History:  Diagnosis Date  . Arthritis   . GERD (gastroesophageal reflux disease)   . Gout   . Pre-diabetes     Past Surgical History:  Procedure Laterality Date  . CATARACT EXTRACTION W/PHACO Right 03/31/2018   Procedure: CATARACT EXTRACTION PHACO AND INTRAOCULAR LENS PLACEMENT ([place]);  Surgeon: [name], MD;  Location: [facility] ORS;  Service: Ophthalmology;  Laterality: Right;  [place] 00:39.7 CDE 4.35 Fluid Pack Lot # [id number]  . CHOLECYSTECTOMY  1995  . COLONOSCOPY WITH PROPOFOL N/A 06/07/2017   Procedure: COLONOSCOPY WITH PROPOFOL;  Surgeon: [name], MD;  Location: [facility] ENDOSCOPY;  Service: Endoscopy;  Laterality: N/A;  . ESOPHAGOGASTRODUODENOSCOPY (EGD) WITH PROPOFOL N/A 06/07/2017   Procedure: ESOPHAGOGASTRODUODENOSCOPY (EGD) WITH PROPOFOL;  Surgeon: [name], MD;  Location: [facility] ENDOSCOPY;  Service: Endoscopy;  Laterality: N/A;  . REDUCTION MAMMAPLASTY Bilateral 1994    Allergies  Allergen Reactions  . Ace Inhibitors Anaphylaxis    Social History   Tobacco Use  . Smoking status: Never Smoker  . Smokeless tobacco: Never Used  Substance Use Topics  . Alcohol use: No    Family History  Problem Relation Age of Onset  . Breast cancer Maternal Aunt   . Breast cancer Paternal Aunt   . CAD Father   . Heart attack Father    Prior to  Admission medications   Medication Sig Start Date End Date Taking? Authorizing Provider  acetaminophen (TYLENOL) 500 MG tablet Take 1,000 mg by mouth daily as needed for moderate pain or headache.   Yes [provider]  Glucosamine-Chondroit-Vit C-Mn (GLUCOSAMINE CHONDR 1500 COMPLX PO) Take 1 tablet by mouth 2 (two) times daily.   Yes [provider]  hydrochlorothiazide (HYDRODIURIL) 25 MG tablet Take 12.5 mg by mouth daily.  06/12/16  Yes [provider]  omeprazole (PRILOSEC) 40 MG capsule Take 40 mg by mouth at bedtime.   Yes [provider]     Review of Systems  Positive ROS: as above  All other systems have been reviewed and were otherwise negative with the exception of those mentioned in the HPI and as above.  Objective: Vital signs in last 24 hours: Temp:  [97.6 F (36.4 C)] 97.6 F (36.4 C) (05/15 1654) Pulse Rate:  [66-78] 71 (05/15 2045) Resp:  [16-21] 19 (05/15 1830) BP: (99-119)/(54-102) 108/59 (05/15 2015) SpO2:  [95 %-100 %] 100 % (05/15 2045) Weight:  [85.3 kg] 85.3 kg (05/15 1656)  General Appearance: Alert, cooperative, no distress, appears stated age Head: Normocephalic, without obvious abnormality, atraumatic Neck: Supple, symmetrical, trachea midline, no adenopathy; thyroid: No enlargement/tenderness/nodules; no carotid bruit or JVD Lungs:  respirations unlabored Heart: Regular rate and rhythm Extremities: Extremities normal, atraumatic, no cyanosis or edema Pulses: 2+ and symmetric all extremities Skin: Skin color, texture,  turgor normal, no rashes or lesions  NEUROLOGIC:   Mental status: A&O x4, no aphasia, good AS, fund of knowledge and memory Motor Exam -B knee extension 3/5, B hip flexors 1/5, B dorsiflexion 0/5, B deltoid 4-/5, B tricep 4-/5, B bicep 4-/5, B hand grip 0/5, B wrist extension 0/5 Sensory Exam - grossly normal Reflexes: Coordination - unable to test because of weakness in extremities Gait - unable to  test Balance - unable to test Cranial Nerves: I: smell Not tested  II: visual acuity  OS: na    OD: na  II: visual fields Full to confrontation  II: pupils   III,VII: ptosis None  III,IV,VI: extraocular muscles  Full ROM  V: mastication   V: facial light touch sensation    V,VII: corneal reflex    VII: facial muscle function - upper    VII: facial muscle function - lower   VIII: hearing   IX: soft palate elevation    IX,X: gag reflex   XI: trapezius strength    XI: sternocleidomastoid strength   XI: neck flexion strength    XII: tongue strength      Data Review Lab Results  Component Value Date   WBC 6.9 08/26/2019   HGB 13.7 08/26/2019   HCT 42.6 08/26/2019   MCV 88.0 08/26/2019   PLT 237 08/26/2019   Lab Results  Component Value Date   NA 139 08/26/2019   K 3.9 08/26/2019   CL 104 08/26/2019   CO2 23 08/26/2019   BUN 16 08/26/2019   CREATININE 0.86 08/26/2019   GLUCOSE 123 (H) 08/26/2019   Lab Results  Component Value Date   INR 0.90 10/15/2016    Assessment/Plan: 79 year old female presented to the ED tonight after sustaining a fall and quadriparesis. 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 She presents as a central cord syndrome. I do think that this needs surgical intervention. We will plan to admit her to the floor and place her on decadron and in an aspen collar to give her spinal cord time to settle down. Planning for an ACDF C3-C7 on Monday afternoon. We discussed all of the risks and benefits associated with the surgery with the patient and her daughter. They understood and agreed to move forward.    Ocie Cornfield [facility] 08/26/2019 10:33 PM

## 2019-08-26 NOTE — ED Notes (Signed)
Haywood Pao daughter [phone] looking for an update on pt

## 2019-08-26 NOTE — ED Notes (Signed)
Pt transported to CT ?

## 2019-08-26 NOTE — ED Notes (Signed)
Meredith Roach husband [phone] call with any update

## 2019-08-26 NOTE — ED Provider Notes (Signed)
[facility] EMERGENCY DEPARTMENT Provider Note   CSN: [id number] Arrival date & time: 08/26/19  1641     History Chief Complaint  Patient presents with  . Numbness  . Fall  . Weakness    Meredith Roach is a 79 y.o. female.  Presents to the emergency department with chief complaint of weakness.  For the past month or so has had some intermittent tingling, slight weakness in her arms.  This is always been mild, transient.  This afternoon she was outside in her garden when suddenly felt weak and fell to the ground.  Thinks she had hit the side of her head, denies any other injuries.  Not having any pain from the fall.  States immediately after the fall she had a hard time moving her arms or legs.  States that her weakness has seemed to be somewhat improving.  States that she also has some numb tingling feeling in her upper and lower legs as well as around her saddle region.  No bladder or bowel incontinence.  HPI     Past Medical History:  Diagnosis Date  . Arthritis   . GERD (gastroesophageal reflux disease)   . Gout   . Pre-diabetes     Patient Active Problem List   Diagnosis Date Noted  . 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 Radiculopathy 10/21/2016    Past Surgical History:  Procedure Laterality Date  . CATARACT EXTRACTION W/PHACO Right 03/31/2018   Procedure: CATARACT EXTRACTION PHACO AND INTRAOCULAR LENS PLACEMENT ([place]);  Surgeon: [name], MD;  Location: [facility] ORS;  Service: Ophthalmology;  Laterality: Right;  [place] 00:39.7 CDE 4.35 Fluid Pack Lot # [id number]  . CHOLECYSTECTOMY  1995  . COLONOSCOPY WITH PROPOFOL N/A 06/07/2017   Procedure: COLONOSCOPY WITH PROPOFOL;  Surgeon: [name], MD;  Location: [facility] ENDOSCOPY;  Service: Endoscopy;  Laterality: N/A;  . ESOPHAGOGASTRODUODENOSCOPY (EGD) WITH PROPOFOL N/A 06/07/2017   Procedure: ESOPHAGOGASTRODUODENOSCOPY (EGD) WITH PROPOFOL;  Surgeon: [name], MD;  Location: [facility] ENDOSCOPY;  Service: Endoscopy;  Laterality: N/A;  . REDUCTION MAMMAPLASTY Bilateral 1994     OB History   No obstetric history on file.     Family History  Problem Relation Age of Onset  . Breast cancer Maternal Aunt   . Breast cancer Paternal Aunt   . CAD Father   . Heart attack Father     Social History   Tobacco Use  . Smoking status: Never Smoker  . Smokeless tobacco: Never Used  Substance Use Topics  . Alcohol use: No  . Drug use: No    Home Medications Prior to Admission medications   Medication Sig Start Date End Date Taking? Authorizing Provider  acetaminophen (TYLENOL) 500 MG tablet Take 1,000 mg by mouth daily as needed for moderate pain or headache.   Yes [provider]  Glucosamine-Chondroit-Vit C-Mn (GLUCOSAMINE CHONDR 1500 COMPLX PO) Take 1 tablet by mouth 2 (two) times daily.   Yes [provider]  hydrochlorothiazide (HYDRODIURIL) 25 MG tablet Take 12.5 mg by mouth daily.  06/12/16  Yes [provider]  omeprazole (PRILOSEC) 40 MG capsule Take 40 mg by mouth at bedtime.   Yes [provider]    Allergies    Ace inhibitors  Review of Systems   Review of Systems  Constitutional: Negative for chills and fever.  HENT: Negative for ear pain and sore throat.   Eyes: Negative for pain and visual  disturbance.  Respiratory: Negative for cough and shortness of breath.   Cardiovascular: Negative for chest pain and palpitations.  Gastrointestinal: Negative for abdominal pain and vomiting.  Genitourinary: Negative for dysuria and hematuria.  Musculoskeletal: Negative for arthralgias and back pain.  Skin: Negative for color change and rash.  Neurological: Positive for weakness. Negative for seizures and syncope.  All other systems reviewed and are negative.   Physical Exam Updated Vital Signs BP (!) 108/59   Pulse 71   Temp 97.6 F (36.4 C) (Oral)   Resp 19   Ht 5\' 3"  (1.6 m)   Wt 85.3 kg    SpO2 100%   BMI 33.30 kg/m   Physical Exam Vitals and nursing note reviewed.  Constitutional:      General: She is not in acute distress.    Appearance: She is well-developed.  HENT:     Head: Normocephalic.     Comments: No significant trauma noted to the face Eyes:     Conjunctiva/sclera: Conjunctivae normal.  Cardiovascular:     Rate and Rhythm: Normal rate and regular rhythm.     Heart sounds: No murmur.  Pulmonary:     Effort: Pulmonary effort is normal. No respiratory distress.     Breath sounds: Normal breath sounds.  Abdominal:     Palpations: Abdomen is soft.     Tenderness: There is no abdominal tenderness.  Musculoskeletal:     Cervical back: No tenderness.     Comments: Back: no C, T, L spine TTP, no step off or deformity RUE: no TTP throughout, no deformity, normal joint ROM, radial pulse intact, distal sensation and motor intact LUE: no TTP throughout, no deformity, normal joint ROM, radial pulse intact, distal sensation and motor intact RLE:  no TTP throughout, no deformity, normal joint ROM, distal pulse, sensation and motor intact LLE: no TTP throughout, no deformity, normal joint ROM, distal pulse, sensation and motor intact  Skin:    General: Skin is warm and dry.     Capillary Refill: Capillary refill takes less than 2 seconds.  Neurological:     Mental Status: She is alert.     Comments: Alert and oriented x3, 2-3/5 strength in upper and lower extremities, sensation to light touch intact, somewhat decreased but partially intact rectal tone, brisk patellar reflexes b/l     ED Results / Procedures / Treatments   Labs (all labs ordered are listed, but only abnormal results are displayed) Labs Reviewed  COMPREHENSIVE METABOLIC PANEL - Abnormal; Notable for the following components:      Result Value   Glucose, Bld 123 (*)    Total Protein 6.3 (*)    All other components within normal limits  SARS CORONAVIRUS 2 BY RT PCR (HOSPITAL ORDER, [facility])  CBC WITH DIFFERENTIAL/PLATELET  CK  TYPE AND SCREEN    EKG None  Radiology CT Head Wo Contrast  Result Date: 08/26/2019 CLINICAL DATA:  Head trauma. Headache. History also reports numbness to the left hand which is new and difficulty grabbing with her hands. EXAM: CT HEAD WITHOUT CONTRAST CT CERVICAL SPINE WITHOUT CONTRAST TECHNIQUE: Multidetector CT imaging of the head and cervical spine was performed following the standard protocol without intravenous contrast. Multiplanar CT image reconstructions of the cervical spine were also generated. COMPARISON:  None. FINDINGS: CT HEAD FINDINGS Brain: No evidence of acute infarction, hemorrhage, hydrocephalus, extra-axial collection or mass lesion/mass effect. Vascular: No hyperdense vessel or unexpected calcification. Skull: Normal. Negative for  fracture or focal lesion. Sinuses/Orbits: Status post right cataract surgery. Globes and orbits are otherwise unremarkable. Minor ethmoid, right frontal and inferior right maxillary sinus mucosal thickening. Clear mastoid air cells. Other: None. CT CERVICAL SPINE FINDINGS Alignment: Normal. Skull base and vertebrae: No acute fracture. No primary bone lesion or focal pathologic process. Soft tissues and spinal canal: No prevertebral fluid or swelling. No visible canal hematoma. Disc levels: There are significant degenerative changes. Mild to moderate loss of disc height at C2-C3, moderate loss of disc height at C3-C4, mild loss of disc height at C5-C6 and C6-C7. There are endplate osteophytes throughout the cervical spine. There are varying degrees of spondylotic disc bulging, greatest at C3-C4 with disc bulging leading to narrowing of the spinal canal, AP diameter just under 7 mm. No convincing disc herniation. Upper chest: No acute findings.  Clear lung apices. Other: None. IMPRESSION: HEAD CT 1. No intracranial abnormality. CERVICAL CT 1. No fracture or acute finding. 2. Advanced  degenerative changes. Moderate central spinal stenosis due to spondylotic disc bulging at C3-C4. Electronically Signed   By: Lajean Manes M.D.   On: 08/26/2019 18:26   CT Cervical Spine Wo Contrast  Result Date: 08/26/2019 CLINICAL DATA:  Head trauma. Headache. History also reports numbness to the left hand which is new and difficulty grabbing with her hands. EXAM: CT HEAD WITHOUT CONTRAST CT CERVICAL SPINE WITHOUT CONTRAST TECHNIQUE: Multidetector CT imaging of the head and cervical spine was performed following the standard protocol without intravenous contrast. Multiplanar CT image reconstructions of the cervical spine were also generated. COMPARISON:  None. FINDINGS: CT HEAD FINDINGS Brain: No evidence of acute infarction, hemorrhage, hydrocephalus, extra-axial collection or mass lesion/mass effect. Vascular: No hyperdense vessel or unexpected calcification. Skull: Normal. Negative for fracture or focal lesion. Sinuses/Orbits: Status post right cataract surgery. Globes and orbits are otherwise unremarkable. Minor ethmoid, right frontal and inferior right maxillary sinus mucosal thickening. Clear mastoid air cells. Other: None. CT CERVICAL SPINE FINDINGS Alignment: Normal. Skull base and vertebrae: No acute fracture. No primary bone lesion or focal pathologic process. Soft tissues and spinal canal: No prevertebral fluid or swelling. No visible canal hematoma. Disc levels: There are significant degenerative changes. Mild to moderate loss of disc height at C2-C3, moderate loss of disc height at C3-C4, mild loss of disc height at C5-C6 and C6-C7. There are endplate osteophytes throughout the cervical spine. There are varying degrees of spondylotic disc bulging, greatest at C3-C4 with disc bulging leading to narrowing of the spinal canal, AP diameter just under 7 mm. No convincing disc herniation. Upper chest: No acute findings.  Clear lung apices. Other: None. IMPRESSION: HEAD CT 1. No intracranial abnormality.  CERVICAL CT 1. No fracture or acute finding. 2. Advanced degenerative changes. Moderate central spinal stenosis due to spondylotic disc bulging at C3-C4. Electronically Signed   By: Lajean Manes M.D.   On: 08/26/2019 18:26   MR BRAIN WO CONTRAST  Result Date: 08/26/2019 CLINICAL DATA:  Upper and lower extremity weakness. Concern for cord compression. EXAM: MRI HEAD WITHOUT CONTRAST MRI CERVICAL SPINE WITHOUT CONTRAST TECHNIQUE: Multiplanar, multiecho pulse sequences of the brain and surrounding structures, and cervical spine, to include the craniocervical junction and cervicothoracic junction, were obtained without intravenous contrast. COMPARISON:  Head and cervical spine CTs 08/26/2019 FINDINGS: MRI HEAD FINDINGS Brain: There is no evidence of acute infarct, intracranial hemorrhage, mass, midline shift, or extra-axial fluid collection. The ventricles and sulci are normal. Scattered T2 hyperintensities in the predominantly subcortical cerebral white matter  bilaterally are nonspecific but compatible with mild chronic small vessel ischemic disease. Mild cerebral atrophy is within normal limits for age. Vascular: Major intracranial vascular flow voids are preserved. Skull and upper cervical spine: Unremarkable bone marrow signal. Sinuses/Orbits: Right cataract extraction. Paranasal sinuses and mastoid air cells are clear. Other: None. MRI CERVICAL SPINE FINDINGS Alignment: Minimal retrolisthesis of C3 on C4 and minimal anterolisthesis of C4 on C5. Vertebrae: No fracture, suspicious osseous lesion, or significant marrow edema. Cord: Multifocal T2 hyperintensity in the spinal cord from C3-4 to C6-7 associated with spinal stenosis at these levels with the signal abnormality being greatest at C3-4 and compatible with myelomalacia. Posterior Fossa, vertebral arteries, paraspinal tissues: Unremarkable. Disc levels: The cervical spinal canal is small in caliber diffusely on a congenital basis. C2-3: Disc bulging, a  right paracentral disc osteophyte complex, and mild right facet arthrosis result in mild spinal stenosis without significant neural foraminal stenosis. C3-4: Disc bulging, a broad posterior disc protrusion, and uncovertebral spurring result in severe spinal stenosis with moderate to severe cord compression and severe left greater than right neural foraminal stenosis. C4-5: A broad right central disc protrusion, disc bulging, uncovertebral spurring, and moderate right facet arthrosis result in severe spinal stenosis with moderate cord compression and severe right neural foraminal stenosis. C5-6: Disc bulging, infolding of the ligamentum flavum, and uncovertebral spurring result in severe spinal stenosis with severe cord compression and severe right and moderate left neural foraminal stenosis. C6-7: Disc bulging, infolding of the ligamentum flavum, and uncovertebral spurring result in severe spinal stenosis with mild cord compression and severe bilateral neural foraminal stenosis. C7-T1: Minimal disc bulging and moderate right and mild left facet arthrosis without stenosis. T1-2: Mild facet arthrosis without disc herniation or stenosis. IMPRESSION: MRI HEAD: 1. No acute intracranial abnormality. 2. Mild chronic small vessel ischemic disease. MRI CERVICAL SPINE: 1. 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 2. Severe spinal stenosis from C3-4 to C6-7 with up to severe cord compression and multifocal myelomalacia. These results were called by telephone at the time of interpretation on 08/26/2019 at 9:00 pm to Dr. Madalyn Rob, who verbally acknowledged these results. Electronically Signed   By: Logan Bores M.D.   On: 08/26/2019 21:10   MR Cervical Spine Wo Contrast  Result Date: 08/26/2019 CLINICAL DATA:  Upper and lower extremity weakness. Concern for cord compression. EXAM: MRI HEAD WITHOUT CONTRAST MRI CERVICAL SPINE WITHOUT CONTRAST TECHNIQUE: Multiplanar, multiecho pulse sequences of the  brain and surrounding structures, and cervical spine, to include the craniocervical junction and cervicothoracic junction, were obtained without intravenous contrast. COMPARISON:  Head and cervical spine CTs 08/26/2019 FINDINGS: MRI HEAD FINDINGS Brain: There is no evidence of acute infarct, intracranial hemorrhage, mass, midline shift, or extra-axial fluid collection. The ventricles and sulci are normal. Scattered T2 hyperintensities in the predominantly subcortical cerebral white matter bilaterally are nonspecific but compatible with mild chronic small vessel ischemic disease. Mild cerebral atrophy is within normal limits for age. Vascular: Major intracranial vascular flow voids are preserved. Skull and upper cervical spine: Unremarkable bone marrow signal. Sinuses/Orbits: Right cataract extraction. Paranasal sinuses and mastoid air cells are clear. Other: None. MRI CERVICAL SPINE FINDINGS Alignment: Minimal retrolisthesis of C3 on C4 and minimal anterolisthesis of C4 on C5. Vertebrae: No fracture, suspicious osseous lesion, or significant marrow edema. Cord: Multifocal T2 hyperintensity in the spinal cord from C3-4 to C6-7 associated with spinal stenosis at these levels with the signal abnormality being greatest at C3-4 and compatible with myelomalacia. Posterior Fossa, vertebral  arteries, paraspinal tissues: Unremarkable. Disc levels: The cervical spinal canal is small in caliber diffusely on a congenital basis. C2-3: Disc bulging, a right paracentral disc osteophyte complex, and mild right facet arthrosis result in mild spinal stenosis without significant neural foraminal stenosis. C3-4: Disc bulging, a broad posterior disc protrusion, and uncovertebral spurring result in severe spinal stenosis with moderate to severe cord compression and severe left greater than right neural foraminal stenosis. C4-5: A broad right central disc protrusion, disc bulging, uncovertebral spurring, and moderate right facet arthrosis  result in severe spinal stenosis with moderate cord compression and severe right neural foraminal stenosis. C5-6: Disc bulging, infolding of the ligamentum flavum, and uncovertebral spurring result in severe spinal stenosis with severe cord compression and severe right and moderate left neural foraminal stenosis. C6-7: Disc bulging, infolding of the ligamentum flavum, and uncovertebral spurring result in severe spinal stenosis with mild cord compression and severe bilateral neural foraminal stenosis. C7-T1: Minimal disc bulging and moderate right and mild left facet arthrosis without stenosis. T1-2: Mild facet arthrosis without disc herniation or stenosis. IMPRESSION: MRI HEAD: 1. No acute intracranial abnormality. 2. Mild chronic small vessel ischemic disease. MRI CERVICAL SPINE: 1. 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 2. Severe spinal stenosis from C3-4 to C6-7 with up to severe cord compression and multifocal myelomalacia. These results were called by telephone at the time of interpretation on 08/26/2019 at 9:00 pm to Dr. Madalyn Rob, who verbally acknowledged these results. Electronically Signed   By: Logan Bores M.D.   On: 08/26/2019 21:10   DG Chest Portable 1 View  Result Date: 08/26/2019 CLINICAL DATA:  Fall.  Weakness. EXAM: PORTABLE CHEST 1 VIEW COMPARISON:  10/15/2016 FINDINGS: Cardiac silhouette is normal in size. No mediastinal hilar masses. No evidence of adenopathy. Hyperexpanded but clear lungs. No convincing pleural effusion and no pneumothorax. Skeletal structures are grossly intact. IMPRESSION: No acute cardiopulmonary disease. Electronically Signed   By: Lajean Manes M.D.   On: 08/26/2019 18:00    Procedures .Critical Care Performed by: Lucrezia Starch, MD Authorized by: Lucrezia Starch, MD   Critical care provider statement:    Critical care time (minutes):  45   Critical care was necessary to treat or prevent imminent or life-threatening  deterioration of the following conditions:  CNS failure or compromise   Critical care was time spent personally by me on the following activities:  Discussions with consultants, evaluation of patient's response to treatment, examination of patient, ordering and performing treatments and interventions, ordering and review of laboratory studies, ordering and review of radiographic studies, pulse oximetry, re-evaluation of patient's condition, obtaining history from patient or surrogate and review of old charts   (including critical care time)  Medications Ordered in ED Medications  hydrochlorothiazide (HYDRODIURIL) tablet 12.5 mg (has no administration in time range)  pantoprazole (PROTONIX) EC tablet 40 mg (has no administration in time range)  0.9 % NaCl with KCl 20 mEq/ L  infusion (has no administration in time range)  sodium chloride flush (NS) 0.9 % injection 3 mL (has no administration in time range)  sodium chloride flush (NS) 0.9 % injection 3 mL (has no administration in time range)  0.9 %  sodium chloride infusion (has no administration in time range)  acetaminophen (TYLENOL) tablet 650 mg (650 mg Oral Given 08/26/19 2249)    Or  acetaminophen (TYLENOL) suppository 650 mg ( Rectal See Alternative 08/26/19 2249)  morphine 2 MG/ML injection 2 mg (has no administration in time range)  methocarbamol (ROBAXIN) tablet 500 mg (has no administration in time range)    Or  methocarbamol (ROBAXIN) 500 mg in dextrose 5 % 50 mL IVPB (has no administration in time range)  senna (SENOKOT) tablet 8.6 mg (8.6 mg Oral Given 08/26/19 2249)  ondansetron (ZOFRAN) tablet 4 mg (has no administration in time range)    Or  ondansetron (ZOFRAN) injection 4 mg (has no administration in time range)  oxyCODONE (Oxy IR/ROXICODONE) immediate release tablet 5 mg (5 mg Oral Given 08/26/19 2249)  dexamethasone (DECADRON) injection 4 mg (has no administration in time range)    Or  dexamethasone (DECADRON) tablet 4 mg  (has no administration in time range)    ED Course  I have reviewed the triage vital signs and the nursing notes.  Pertinent labs & imaging results that were available during my care of the patient were reviewed by me and considered in my medical decision making (see chart for details).  Clinical Course as of Aug 25 2248  Sat Aug 26, 2019  1815 Reviewed with Leonel Ramsay with neuro - rec MRI C spine, Brain wo to further eval, concern for c spine lesion most likely given symptomatology   [RD]  2109 Reviewed with radiologist - multi level stenosis  MR Cervical Spine Wo Contrast [RD]  2127 Talked with nsgy - they will come see pt, updated pt and daughter who is now at bedside   [RD]    Clinical Course User Index [RD] Lucrezia Starch, MD   MDM Rules/Calculators/A&P                      79 year old lady presenting to the emergency room after fall, sudden onset upper and lower extremity weakness.  On exam noted significant weakness in both her upper and lower extremities, equal bilaterally.  CT head, C-spine negative for trauma but did have significant spinal stenosis.  Reviewed with neurology and proceeded with MR imaging to further assess.  MRI spine was concerning for multilevel spinal stenosis with spinal cord impingement.  Consulted neurosurgery.  They will admit, plan to start IV Decadron, Aspen c-collar, likely surgery on Monday.  Dr. Shelba Flake accepting.  Final Clinical Impression(s) / ED Diagnoses Final diagnoses:  Cervical spinal stenosis  Cord compression (HCC)  Central cord syndrome, initial encounter [facility])    Rx / DC Orders ED Discharge Orders    None       Lucrezia Starch, MD 08/26/19 2250

## 2019-08-27 LAB — SURGICAL PCR SCREEN
MRSA, PCR: NEGATIVE
Staphylococcus aureus: NEGATIVE

## 2019-08-27 MED ORDER — CHLORHEXIDINE GLUCONATE CLOTH 2 % EX PADS
6.0000 | MEDICATED_PAD | Freq: Every day | CUTANEOUS | Status: DC
  Administered 2019-08-27 – 2019-08-28 (×2): 6 via TOPICAL

## 2019-08-27 MED ORDER — MUPIROCIN 2 % EX OINT
1.0000 "application " | TOPICAL_OINTMENT | Freq: Two times a day (BID) | CUTANEOUS | Status: DC
  Administered 2019-08-27 (×2): 1 via NASAL
  Filled 2019-08-27 (×2): qty 22

## 2019-08-27 MED ORDER — HYDROCHLOROTHIAZIDE 12.5 MG PO CAPS
12.5000 mg | ORAL_CAPSULE | Freq: Every day | ORAL | Status: DC
  Administered 2019-08-27: 12.5 mg via ORAL
  Filled 2019-08-27 (×2): qty 1

## 2019-08-27 NOTE — Progress Notes (Signed)
Patient ID: Meredith Roach, female   DOB: [birth year], 79 y.o.   MRN: [id number] She states she feels much better.  She has much less pain.  She is in good spirits.  Aspen collar is in place.  No change in neurologic exam except I think she can wiggle her fingers just a little bit more this morning.  She has a little stronger flexion in the arms.  Plan is for surgery tomorrow.  We will do a 4 level ACDF with plating.  She potentially could need a staged procedure with a posterior decompression and fusion in the near future.  Hopefully this would not be necessary.  She understands the risk of the surgery include but are not limited to bleeding, infection, spinal cord injury, numbness, weakness, paralysis, recurrent laryngeal nerve injury, hoarseness, dysphagia, esophageal injury, tracheal injury, carotid or vertebral artery injury, stroke, hematoma, pseudoarthrosis, hardware failure, need for further surgery, lack of relief of symptoms, worsening symptoms, misplaced hardware, and anesthesia risk including DVT pneumonia MI and death.  She agrees to proceed

## 2019-08-27 NOTE — Progress Notes (Signed)
0330: Per initial assessment of patient upon arrival to [facility] from ED, patient did not have C-collar on and patient's right side upper extremity very cold to touch. Left upper side and BLE warm to touch. Patient was not turned until C-collar was ordered and placed on patient. Also, patient has not urinated so bladder scan was performed and 48mls of urine noted. On-call provider notified. New orders received and acknowledged. Foley placed and urine output noted.

## 2019-08-28 ENCOUNTER — Encounter (HOSPITAL_COMMUNITY)
Admission: EM | Disposition: A | Discharge status: Rehabilitation Facility | Payer: Self-pay | Source: Home / Self Care | Attending: Neurological Surgery

## 2019-08-28 ENCOUNTER — Inpatient Hospital Stay (HOSPITAL_COMMUNITY): Payer: Medicare PPO | Admitting: Certified Registered Nurse Anesthetist

## 2019-08-28 ENCOUNTER — Encounter (HOSPITAL_COMMUNITY): Payer: Self-pay | Admitting: Neurological Surgery

## 2019-08-28 ENCOUNTER — Inpatient Hospital Stay (HOSPITAL_COMMUNITY): Payer: Medicare PPO

## 2019-08-28 DIAGNOSIS — Z981 Arthrodesis status: Secondary | ICD-10-CM

## 2019-08-28 HISTORY — PX: ANTERIOR CERVICAL DECOMPRESSION/DISCECTOMY FUSION 4 LEVELS: SHX5556

## 2019-08-28 SURGERY — ANTERIOR CERVICAL DECOMPRESSION/DISCECTOMY FUSION 4 LEVELS
Anesthesia: General | Site: Spine Cervical

## 2019-08-28 MED ORDER — THROMBIN 5000 UNITS EX SOLR
CUTANEOUS | Status: AC
  Filled 2019-08-28: qty 5000

## 2019-08-28 MED ORDER — PROPOFOL 10 MG/ML IV BOLUS
INTRAVENOUS | Status: AC
  Filled 2019-08-28: qty 20

## 2019-08-28 MED ORDER — SODIUM CHLORIDE 0.9 % IV SOLN
INTRAVENOUS | Status: DC | PRN
  Administered 2019-08-28: 500 mL

## 2019-08-28 MED ORDER — SODIUM CHLORIDE 0.9% FLUSH: 3.0000 mL | INTRAVENOUS | Status: DC | PRN

## 2019-08-28 MED ORDER — MORPHINE SULFATE (PF) 2 MG/ML IV SOLN: 2.0000 mg | INTRAVENOUS | Status: DC | PRN

## 2019-08-28 MED ORDER — EPHEDRINE 5 MG/ML INJ
INTRAVENOUS | Status: AC
  Filled 2019-08-28: qty 10

## 2019-08-28 MED ORDER — FENTANYL CITRATE (PF) 250 MCG/5ML IJ SOLN
INTRAMUSCULAR | Status: AC
  Filled 2019-08-28: qty 5

## 2019-08-28 MED ORDER — BUPIVACAINE HCL (PF) 0.25 % IJ SOLN
INTRAMUSCULAR | Status: DC | PRN
  Administered 2019-08-28: 4.5 mL

## 2019-08-28 MED ORDER — METHOCARBAMOL 1000 MG/10ML IJ SOLN
500.0000 mg | Freq: Four times a day (QID) | INTRAVENOUS | Status: DC | PRN
  Filled 2019-08-28: qty 5

## 2019-08-28 MED ORDER — DEXAMETHASONE SODIUM PHOSPHATE 4 MG/ML IJ SOLN
4.0000 mg | Freq: Four times a day (QID) | INTRAMUSCULAR | Status: DC
  Administered 2019-08-28 – 2019-08-30 (×4): 4 mg via INTRAVENOUS
  Filled 2019-08-28 (×5): qty 1

## 2019-08-28 MED ORDER — CHLORHEXIDINE GLUCONATE CLOTH 2 % EX PADS
6.0000 | MEDICATED_PAD | Freq: Every day | CUTANEOUS | Status: DC
  Administered 2019-08-28 – 2019-08-31 (×4): 6 via TOPICAL

## 2019-08-28 MED ORDER — OXYCODONE HCL 5 MG PO TABS: 10.0000 mg | ORAL_TABLET | ORAL | Status: DC | PRN

## 2019-08-28 MED ORDER — CEFAZOLIN SODIUM-DEXTROSE 2-4 GM/100ML-% IV SOLN
2.0000 g | Freq: Three times a day (TID) | INTRAVENOUS | Status: AC
  Administered 2019-08-28 – 2019-08-29 (×2): 2 g via INTRAVENOUS
  Filled 2019-08-28 (×2): qty 100

## 2019-08-28 MED ORDER — ONDANSETRON HCL 4 MG/2ML IJ SOLN: 4.0000 mg | Freq: Four times a day (QID) | INTRAMUSCULAR | Status: DC | PRN

## 2019-08-28 MED ORDER — POTASSIUM CHLORIDE IN NACL 20-0.9 MEQ/L-% IV SOLN
INTRAVENOUS | Status: DC
  Filled 2019-08-28: qty 1000

## 2019-08-28 MED ORDER — PHENYLEPHRINE 40 MCG/ML (10ML) SYRINGE FOR IV PUSH (FOR BLOOD PRESSURE SUPPORT)
PREFILLED_SYRINGE | INTRAVENOUS | Status: DC | PRN
  Administered 2019-08-28 (×3): 120 ug via INTRAVENOUS

## 2019-08-28 MED ORDER — LIDOCAINE 2% (20 MG/ML) 5 ML SYRINGE
INTRAMUSCULAR | Status: AC
  Filled 2019-08-28: qty 5

## 2019-08-28 MED ORDER — 0.9 % SODIUM CHLORIDE (POUR BTL) OPTIME
TOPICAL | Status: DC | PRN
  Administered 2019-08-28: 1000 mL

## 2019-08-28 MED ORDER — ROCURONIUM BROMIDE 10 MG/ML (PF) SYRINGE
PREFILLED_SYRINGE | INTRAVENOUS | Status: AC
  Filled 2019-08-28: qty 10

## 2019-08-28 MED ORDER — ONDANSETRON HCL 4 MG/2ML IJ SOLN
INTRAMUSCULAR | Status: DC | PRN
  Administered 2019-08-28: 4 mg via INTRAVENOUS

## 2019-08-28 MED ORDER — CEFAZOLIN SODIUM 1 G IJ SOLR
INTRAMUSCULAR | Status: AC
  Filled 2019-08-28: qty 20

## 2019-08-28 MED ORDER — ACETAMINOPHEN 10 MG/ML IV SOLN
INTRAVENOUS | Status: AC
  Filled 2019-08-28: qty 100

## 2019-08-28 MED ORDER — PHENYLEPHRINE HCL-NACL 10-0.9 MG/250ML-% IV SOLN
INTRAVENOUS | Status: DC | PRN
  Administered 2019-08-28: 25 ug/min via INTRAVENOUS

## 2019-08-28 MED ORDER — ACETAMINOPHEN 500 MG PO TABS: 1000.0000 mg | ORAL_TABLET | Freq: Once | ORAL | Status: DC

## 2019-08-28 MED ORDER — METHOCARBAMOL 500 MG PO TABS
500.0000 mg | ORAL_TABLET | Freq: Four times a day (QID) | ORAL | Status: DC | PRN
  Administered 2019-08-29: 500 mg via ORAL
  Filled 2019-08-28: qty 1

## 2019-08-28 MED ORDER — PHENYLEPHRINE 40 MCG/ML (10ML) SYRINGE FOR IV PUSH (FOR BLOOD PRESSURE SUPPORT)
PREFILLED_SYRINGE | INTRAVENOUS | Status: AC
  Filled 2019-08-28: qty 10

## 2019-08-28 MED ORDER — BUPIVACAINE HCL (PF) 0.25 % IJ SOLN
INTRAMUSCULAR | Status: AC
  Filled 2019-08-28: qty 30

## 2019-08-28 MED ORDER — SUGAMMADEX SODIUM 200 MG/2ML IV SOLN
INTRAVENOUS | Status: DC | PRN
  Administered 2019-08-28: 200 mg via INTRAVENOUS

## 2019-08-28 MED ORDER — THROMBIN 20000 UNITS EX SOLR
CUTANEOUS | Status: DC | PRN
  Administered 2019-08-28: 20 mL via TOPICAL

## 2019-08-28 MED ORDER — SODIUM CHLORIDE 0.9% FLUSH
3.0000 mL | Freq: Two times a day (BID) | INTRAVENOUS | Status: DC
  Administered 2019-08-29 – 2019-08-31 (×5): 3 mL via INTRAVENOUS

## 2019-08-28 MED ORDER — ACETAMINOPHEN 10 MG/ML IV SOLN
INTRAVENOUS | Status: DC | PRN
Start: 2019-08-28 — End: 2019-08-28
  Administered 2019-08-28: 1000 mg via INTRAVENOUS

## 2019-08-28 MED ORDER — MENTHOL 3 MG MT LOZG
1.0000 | LOZENGE | OROMUCOSAL | Status: DC | PRN
  Administered 2019-08-29: 3 mg via ORAL
  Filled 2019-08-28 (×2): qty 9

## 2019-08-28 MED ORDER — SENNA 8.6 MG PO TABS
1.0000 | ORAL_TABLET | Freq: Two times a day (BID) | ORAL | Status: DC
  Administered 2019-08-29 – 2019-08-30 (×4): 8.6 mg via ORAL
  Filled 2019-08-28 (×5): qty 1

## 2019-08-28 MED ORDER — SODIUM CHLORIDE 0.9 % IV SOLN: 250.0000 mL | INTRAVENOUS | Status: DC

## 2019-08-28 MED ORDER — ONDANSETRON HCL 4 MG PO TABS: 4.0000 mg | ORAL_TABLET | Freq: Four times a day (QID) | ORAL | Status: DC | PRN

## 2019-08-28 MED ORDER — DEXAMETHASONE 4 MG PO TABS
4.0000 mg | ORAL_TABLET | Freq: Four times a day (QID) | ORAL | Status: DC
  Administered 2019-08-29 – 2019-08-31 (×8): 4 mg via ORAL
  Filled 2019-08-28 (×8): qty 1

## 2019-08-28 MED ORDER — FENTANYL CITRATE (PF) 250 MCG/5ML IJ SOLN
INTRAMUSCULAR | Status: DC | PRN
  Administered 2019-08-28: 100 ug via INTRAVENOUS
  Administered 2019-08-28 (×6): 50 ug via INTRAVENOUS

## 2019-08-28 MED ORDER — ALBUMIN HUMAN 5 % IV SOLN
INTRAVENOUS | Status: DC | PRN
Start: 2019-08-28 — End: 2019-08-28

## 2019-08-28 MED ORDER — LACTATED RINGERS IV SOLN
INTRAVENOUS | Status: DC | PRN
Start: 2019-08-28 — End: 2019-08-28

## 2019-08-28 MED ORDER — CEFAZOLIN SODIUM-DEXTROSE 2-3 GM-%(50ML) IV SOLR
INTRAVENOUS | Status: DC | PRN
Start: 2019-08-28 — End: 2019-08-28
  Administered 2019-08-28: 2 g via INTRAVENOUS

## 2019-08-28 MED ORDER — ACETAMINOPHEN 650 MG RE SUPP: 650.0000 mg | RECTAL | Status: DC | PRN

## 2019-08-28 MED ORDER — LIDOCAINE 2% (20 MG/ML) 5 ML SYRINGE
INTRAMUSCULAR | Status: DC | PRN
  Administered 2019-08-28: 80 mg via INTRAVENOUS

## 2019-08-28 MED ORDER — THROMBIN 20000 UNITS EX SOLR
CUTANEOUS | Status: AC
  Filled 2019-08-28: qty 20000

## 2019-08-28 MED ORDER — LACTATED RINGERS IV SOLN: INTRAVENOUS | Status: DC | PRN

## 2019-08-28 MED ORDER — DEXAMETHASONE SODIUM PHOSPHATE 10 MG/ML IJ SOLN
INTRAMUSCULAR | Status: DC | PRN
  Administered 2019-08-28: 10 mg via INTRAVENOUS

## 2019-08-28 MED ORDER — EPHEDRINE SULFATE-NACL 50-0.9 MG/10ML-% IV SOSY
PREFILLED_SYRINGE | INTRAVENOUS | Status: DC | PRN
  Administered 2019-08-28 (×2): 5 mg via INTRAVENOUS

## 2019-08-28 MED ORDER — PHENOL 1.4 % MT LIQD: 1.0000 | OROMUCOSAL | Status: DC | PRN

## 2019-08-28 MED ORDER — ACETAMINOPHEN 325 MG PO TABS: 650.0000 mg | ORAL_TABLET | ORAL | Status: DC | PRN

## 2019-08-28 MED ORDER — SODIUM CHLORIDE (PF) 0.9 % IJ SOLN
INTRAMUSCULAR | Status: AC
  Filled 2019-08-28: qty 10

## 2019-08-28 MED ORDER — ROCURONIUM BROMIDE 10 MG/ML (PF) SYRINGE
PREFILLED_SYRINGE | INTRAVENOUS | Status: DC | PRN
  Administered 2019-08-28: 80 mg via INTRAVENOUS
  Administered 2019-08-28 (×6): 20 mg via INTRAVENOUS

## 2019-08-28 MED ORDER — PROPOFOL 10 MG/ML IV BOLUS
INTRAVENOUS | Status: DC | PRN
  Administered 2019-08-28: 150 mg via INTRAVENOUS

## 2019-08-28 MED ORDER — THROMBIN 5000 UNITS EX SOLR
OROMUCOSAL | Status: DC | PRN
  Administered 2019-08-28 (×3): 5 mL via TOPICAL

## 2019-08-28 SURGICAL SUPPLY — 56 items
BAG DECANTER FOR FLEXI CONT (MISCELLANEOUS) ×3 IMPLANT
BAND RUBBER #18 3X1/16 STRL (MISCELLANEOUS) ×6 IMPLANT
BASKET BONE COLLECTION (BASKET) ×3 IMPLANT
BENZOIN TINCTURE PRP APPL 2/3 (GAUZE/BANDAGES/DRESSINGS) ×3 IMPLANT
BIT DRILL 14MM (INSTRUMENTS) ×1 IMPLANT
BUR CARBIDE MATCH 3.0 (BURR) ×3 IMPLANT
CANISTER SUCT 3000ML PPV (MISCELLANEOUS) ×3 IMPLANT
CARTRIDGE OIL MAESTRO DRILL (MISCELLANEOUS) IMPLANT
CLOSURE WOUND 1/2 X4 (GAUZE/BANDAGES/DRESSINGS) ×1
COVER WAND RF STERILE (DRAPES) IMPLANT
DIFFUSER DRILL AIR PNEUMATIC (MISCELLANEOUS) IMPLANT
DRAIN SNY WOU 7FLT (WOUND CARE) ×3 IMPLANT
DRAPE C-ARM 42X72 X-RAY (DRAPES) ×3 IMPLANT
DRAPE LAPAROTOMY 100X72 PEDS (DRAPES) ×3 IMPLANT
DRAPE MICROSCOPE LEICA (MISCELLANEOUS) ×3 IMPLANT
DRILL 14MM (INSTRUMENTS) ×3
DRSG OPSITE POSTOP 4X6 (GAUZE/BANDAGES/DRESSINGS) ×3 IMPLANT
DURAPREP 6ML APPLICATOR 50/CS (WOUND CARE) ×3 IMPLANT
ELECT COATED BLADE 2.86 ST (ELECTRODE) ×3 IMPLANT
ELECT REM PT RETURN 9FT ADLT (ELECTROSURGICAL) ×9
ELECTRODE REM PT RTRN 9FT ADLT (ELECTROSURGICAL) ×3 IMPLANT
EVACUATOR SILICONE 100CC (DRAIN) ×3 IMPLANT
GAUZE 4X4 16PLY RFD (DISPOSABLE) IMPLANT
GLOVE BIO SURGEON STRL SZ 6.5 (GLOVE) ×8 IMPLANT
GLOVE BIO SURGEON STRL SZ7 (GLOVE) ×6 IMPLANT
GLOVE BIO SURGEON STRL SZ8 (GLOVE) ×12 IMPLANT
GLOVE BIO SURGEONS STRL SZ 6.5 (GLOVE) ×4
GLOVE BIOGEL PI IND STRL 7.0 (GLOVE) ×4 IMPLANT
GLOVE BIOGEL PI INDICATOR 7.0 (GLOVE) ×8
GOWN STRL REUS W/ TWL LRG LVL3 (GOWN DISPOSABLE) ×4 IMPLANT
GOWN STRL REUS W/ TWL XL LVL3 (GOWN DISPOSABLE) ×3 IMPLANT
GOWN STRL REUS W/TWL 2XL LVL3 (GOWN DISPOSABLE) IMPLANT
GOWN STRL REUS W/TWL LRG LVL3 (GOWN DISPOSABLE) ×8
GOWN STRL REUS W/TWL XL LVL3 (GOWN DISPOSABLE) ×6
HEMOSTAT POWDER KIT SURGIFOAM (HEMOSTASIS) ×9 IMPLANT
KIT BASIN OR (CUSTOM PROCEDURE TRAY) ×3 IMPLANT
KIT TURNOVER KIT B (KITS) ×3 IMPLANT
NEEDLE HYPO 25X1 1.5 SAFETY (NEEDLE) ×3 IMPLANT
NEEDLE SPNL 20GX3.5 QUINCKE YW (NEEDLE) ×3 IMPLANT
NS IRRIG 1000ML POUR BTL (IV SOLUTION) ×3 IMPLANT
OIL CARTRIDGE MAESTRO DRILL (MISCELLANEOUS)
PACK LAMINECTOMY NEURO (CUSTOM PROCEDURE TRAY) ×3 IMPLANT
PAD ARMBOARD 7.5X6 YLW CONV (MISCELLANEOUS) ×3 IMPLANT
PATTIES SURGICAL 1X1 (DISPOSABLE) ×3 IMPLANT
PIN DISTRACTION 14MM (PIN) ×6 IMPLANT
PLATE 64MM (Plate) ×3 IMPLANT
SCREW CANN 4X16 SS S/DRILL (Screw) ×30 IMPLANT
SPACER PTI-C 7X16X14 IDENTI (Spacer) ×12 IMPLANT
SPONGE INTESTINAL PEANUT (DISPOSABLE) ×3 IMPLANT
SPONGE SURGIFOAM ABS GEL 100 (HEMOSTASIS) ×3 IMPLANT
STRIP CLOSURE SKIN 1/2X4 (GAUZE/BANDAGES/DRESSINGS) ×2 IMPLANT
SUT VIC AB 3-0 SH 8-18 (SUTURE) ×6 IMPLANT
SUT VICRYL 4-0 PS2 18IN ABS (SUTURE) ×3 IMPLANT
TOWEL GREEN STERILE (TOWEL DISPOSABLE) ×3 IMPLANT
TOWEL GREEN STERILE FF (TOWEL DISPOSABLE) ×3 IMPLANT
WATER STERILE IRR 1000ML POUR (IV SOLUTION) ×3 IMPLANT

## 2019-08-28 NOTE — Op Note (Signed)
08/28/2019  4:38 PM  PATIENT:  Meredith Roach  79 y.o. 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 Decompressive anterior cervical discectomy C3-4 C4-5 C5-6 C6-7, 2. Anterior cervical arthrodesis C3-4 C4-5 C5-6 C6-7 utilizing a porous titanium interbody cage packed with locally harvested morcellized autologous bone graft, 3. Anterior cervical plating C3-C7 inclusive utilizing a Alphatec plate  SURGEON:  Sherley Bounds, MD  ASSISTANTS: Glenford Peers, FNP  ANESTHESIA:   General  EBL: 500 ml  Total I/O In: 1800 [I.V.:1200; IV Piggyback:600] Out: 1975 [id number]; Blood:500]  BLOOD ADMINISTERED: none  DRAINS: none  SPECIMEN:  none  INDICATION FOR PROCEDURE: This patient presented with quadriparesis after a fall. Imaging showed severe spinal stenosis with signal change in the spinal cord at C3-4 and at C6-7.  Recommended ACDF with plating as the initial surgery to decompress the cord. Patient understood the risks, benefits, and alternatives and potential outcomes and wished to proceed.  PROCEDURE DETAILS: Patient was brought to the operating room placed under general endotracheal anesthesia. Patient was placed in the supine position on the operating room table. The neck was prepped with Duraprep and draped in a sterile fashion.   Three cc of local anesthesia was injected and a transverse incision was made on the right side of the neck.  Dissection was carried down thru the subcutaneous tissue and the platysma was  elevated, opened, and undermined with Metzenbaum scissors.  Dissection was then carried out thru an avascular plane leaving the sternocleidomastoid carotid artery and jugular vein laterally and the trachea and esophagus medially with the assistance of my nurse practitioner. The ventral aspect of the vertebral column was identified and a localizing  x-ray was taken. The C 5 6 level was identified and all in the room agreed with the level. The longus colli muscles were then elevated and the retractor was placed with the assistance of my nurse practitioner to expose C3-4 C4-5 C5-6 and C6-7.  There are large anterior osteophytes especially on the left-hand side.  We used a Land and the osteophyte tool to remove these and expose the disc spaces.  We drilled down the anterior vertebral bodies until they were flat.  The annulus was incised at each level and the disc space entered. Discectomy was performed with micro-curettes and pituitary rongeurs. I then used the high-speed drill to drill the endplates down to the level of the posterior longitudinal ligament. The drill shavings were saved in a mucous trap for later arthrodesis. The operating microscope was draped and brought into the field provided additional magnification, illumination and visualization. Discectomy was continued posteriorly thru the disc space. Posterior longitudinal ligament was gently opened with a nerve hook, and then removed along with disc herniation and osteophytes, decompressing the spinal canal and thecal sac at each level with a 1 and 2 mm Kerrison punch.  There was severe spondylosis and stenosis with severe cord compression.  We undercut the vertebral bodies until the dura relaxed and was full all the way across. we then continued to remove osteophytic overgrowth and disc material decompressing the neural foramina and exiting nerve roots bilaterally. The scope was angled up and down to help decompress and undercut the vertebral bodies. Once the decompression was completed we could pass a nerve hook circumferentially to assure adequate decompression in the midline and in the neural foramina. So by both visualization and palpation we felt we had  an adequate decompression of the neural elements. We then measured the height of the intravertebral disc space and selected a 7  millimeter porous titanium interbody cage packed with autograft for each of the 4 interspaces. It was then gently positioned in the intravertebral disc spaces and countersunk. I then used a 64 mm Alphatec plate and placed variable angle screws into the vertebral bodies of each level and locked them into position. The wound was irrigated with bacitracin solution, checked for hemostasis which was established and confirmed. Once meticulous hemostasis was achieved, we placed a 7 flat JP drain and we then proceeded with closure with the assistance of my nurse practitioner. The platysma was closed with interrupted 3-0 undyed Vicryl suture, the subcuticular layer was closed with interrupted 3-0 undyed Vicryl suture. The skin edges were approximated with steristrips. The drapes were removed. A sterile dressing was applied. The patient was then awakened from general anesthesia and transferred to the recovery room in stable condition. At the end of the procedure all sponge, needle and instrument counts were correct.   PLAN OF CARE: Admit to inpatient   PATIENT DISPOSITION:  PACU - hemodynamically stable.   Delay start of Pharmacological VTE agent (>24hrs) due to surgical blood loss or risk of bleeding:  yes

## 2019-08-28 NOTE — Anesthesia Preprocedure Evaluation (Addendum)
Anesthesia Evaluation  Patient identified by MRN, date of birth, ID band Patient awake    Reviewed: Allergy & Precautions, NPO status , Patient's Chart, lab work & pertinent test results  Airway Mallampati: III  TM Distance: >3 FB Neck ROM: Limited  Mouth opening: Limited Mouth Opening  Dental no notable dental hx. (+) Teeth Intact, Dental Advisory Given   Pulmonary neg pulmonary ROS,    Pulmonary exam normal breath sounds clear to auscultation       Cardiovascular hypertension, Normal cardiovascular exam Rhythm:Regular Rate:Normal     Neuro/Psych negative neurological ROS  negative psych ROS   GI/Hepatic Neg liver ROS, GERD  ,  Endo/Other  negative endocrine ROS  Renal/GU negative Renal ROS  negative genitourinary   Musculoskeletal  (+) Arthritis , Osteoarthritis,    Abdominal   Peds  Hematology negative hematology ROS (+)   Anesthesia Other Findings Presented to the ED on 5/15 after sustaining a fall and quadriparesis. 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  Reproductive/Obstetrics                            Anesthesia Physical Anesthesia Plan  ASA: II  Anesthesia Plan: General   Post-op Pain Management:    Induction: Intravenous  PONV Risk Score and Plan: 3 and Dexamethasone, Ondansetron and Treatment may vary due to age or medical condition  Airway Management Planned: Oral ETT  Additional Equipment:   Intra-op Plan:   Post-operative Plan: Extubation in OR  Informed Consent: I have reviewed the patients History and Physical, chart, labs and discussed the procedure including the risks, benefits and alternatives for the proposed anesthesia with the patient or authorized representative who has indicated his/her understanding and acceptance.     Dental advisory given  Plan  Discussed with: CRNA  Anesthesia Plan Comments:         Anesthesia Quick Evaluation

## 2019-08-28 NOTE — Progress Notes (Addendum)
A&Ox4 patient gave her verbal consent for surgery with Dr. Ronnald Ramp scheduled this morning because she is unable to grip, and therefore sign, with either hand. This RN and Harlene Ramus, RN witnessed her verbal consent for the procedure.

## 2019-08-28 NOTE — Anesthesia Procedure Notes (Signed)
Procedure Name: Intubation Date/Time: 08/28/2019 11:26 AM Performed by: Colin Benton, CRNA Pre-anesthesia Checklist: Patient identified, Suction available, Emergency Drugs available and Patient being monitored Patient Re-evaluated:Patient Re-evaluated prior to induction Oxygen Delivery Method: Circle system utilized Preoxygenation: Pre-oxygenation with 100% oxygen Induction Type: IV induction Ventilation: Mask ventilation without difficulty Laryngoscope Size: Glidescope and 3 Grade View: Grade I Tube type: Oral Tube size: 7.0 mm Number of attempts: 1 Airway Equipment and Method: Rigid stylet and Video-laryngoscopy Placement Confirmation: ETT inserted through vocal cords under direct vision,  positive ETCO2 and breath sounds checked- equal and bilateral Secured at: 22 cm Tube secured with: Tape Dental Injury: Teeth and Oropharynx as per pre-operative assessment  Difficulty Due To: Difficulty was anticipated, Difficult Airway- due to cervical collar and Difficult Airway-  due to neck instability

## 2019-08-28 NOTE — Transfer of Care (Signed)
Immediate Anesthesia Transfer of Care Note  Patient: Meredith Pel  Procedure(s) Performed: ANTERIOR CERVICAL DECOMPRESSION/DISCECTOMY FUSION CERVICALTHREE-FOUR CERVICAL,FOUR-FIVE,CERVICAL FIVE-SIX,CERVICAL SIX-SEVEN. (N/A Spine Cervical)  Patient Location: PACU  Anesthesia Type:General  Level of Consciousness: drowsy  Airway & Oxygen Therapy: Patient Spontanous Breathing and Patient connected to face mask oxygen  Post-op Assessment: Report given to RN and Post -op Vital signs reviewed and stable  Post vital signs: Reviewed and stable  Last Vitals:  Vitals Value Taken Time  BP 118/53 08/28/19 1633  Temp    Pulse 95 08/28/19 1636  Resp 12 08/28/19 1636  SpO2 97 % 08/28/19 1636  Vitals shown include unvalidated device data.  Last Pain:  Vitals:   08/28/19 0437  TempSrc: Oral  PainSc: Asleep      Patients Stated Pain Goal: 2 ([id number])  Complications: No apparent anesthesia complications

## 2019-08-28 NOTE — Progress Notes (Signed)
Patient ID: Meredith Roach, female   DOB: [birth year], 79 y.o.   MRN: [id number] Pt seen and examined twice since yesterday round, neuro stable, pain controlled, ready for OR, questions answered

## 2019-08-28 NOTE — Anesthesia Postprocedure Evaluation (Signed)
Anesthesia Post Note  Patient: Meredith Roach  Procedure(s) Performed: ANTERIOR CERVICAL DECOMPRESSION/DISCECTOMY FUSION CERVICALTHREE-FOUR CERVICAL,FOUR-FIVE,CERVICAL FIVE-SIX,CERVICAL SIX-SEVEN. (N/A Spine Cervical)     Patient location during evaluation: PACU Anesthesia Type: General Level of consciousness: awake and alert Pain management: pain level controlled Vital Signs Assessment: post-procedure vital signs reviewed and stable Respiratory status: spontaneous breathing, nonlabored ventilation, respiratory function stable and patient connected to nasal cannula oxygen Cardiovascular status: blood pressure returned to baseline and stable Postop Assessment: no apparent nausea or vomiting Anesthetic complications: no    Last Vitals:  Vitals:   08/28/19 1733 08/28/19 1803  BP: (!) 117/58 (!) 113/54  Pulse: 84 78  Resp: 14 13  Temp: 36.6 C   SpO2: 95% 97%    Last Pain:  Vitals:   08/28/19 1702  TempSrc:   PainSc: 0-No pain                 Elain Wixon L Konya Fauble

## 2019-08-29 DIAGNOSIS — S14123D 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, subsequent encounter: Secondary | ICD-10-CM

## 2019-08-29 MED ORDER — BACLOFEN 10 MG PO TABS
5.0000 mg | ORAL_TABLET | Freq: Three times a day (TID) | ORAL | Status: DC
  Administered 2019-08-29 – 2019-08-30 (×6): 5 mg via ORAL
  Filled 2019-08-29 (×7): qty 1

## 2019-08-29 MED ORDER — ORAL CARE MOUTH RINSE
15.0000 mL | Freq: Two times a day (BID) | OROMUCOSAL | Status: DC
  Administered 2019-08-30 (×2): 15 mL via OROMUCOSAL

## 2019-08-29 MED FILL — Thrombin For Soln 5000 Unit: CUTANEOUS | Qty: 5000 | Status: AC

## 2019-08-29 NOTE — Progress Notes (Signed)
Orthopedic Tech Progress Note Patient Details:  Meredith Roach June 23, 1940 [id number] RN said patient has a [place].  Patient ID: Meredith Roach, female   DOB: 06-17-1940, 79 y.o.   MRN: [id number]   Janit Pagan 08/29/2019, 7:53 AM

## 2019-08-29 NOTE — Evaluation (Signed)
Physical Therapy Evaluation Patient Details Name: Meredith Roach MRN: [id number] DOB: February 27, 1941 Today's Date: 08/29/2019   History of Present Illness   79 yo who fell while gardening in yard and struck her face and suffered a central cord syndrome.  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 Decompressive anterior cervical discectomy C3-4 C4-5 C5-6 C6-7, 2. Anterior cervical arthrodesis C3-4 C4-5 C5-6 C6-7 5/17. She has had a lumbar fusion in 2018   Clinical Impression  Patient presents with evolving paresthesias and improving strength in her extremities.  She demonstrates decreased strength & ROM, decreased balance, decreased activity tolerance, increased muscle tone with decreased coordination and decreased sensation.  Today needing +2 max A for mobility up to EOB and use of lift equipment with total A for OOB to chair.  Daughter was present and both pt and daughter educated some in disease process and reason for caution with BP and need for elevation and ROM for extremities.  She was independent prior to this incident living with her spouse.  Feel she will benefit from skilled PT in the acute setting and follow up CIR level rehab at d/c.      Follow Up Recommendations CIR;Supervision/Assistance - 24 hour    Equipment Recommendations  Other (comment)(TBA next venue)    Recommendations for Other Services       Precautions / Restrictions Precautions Precautions: Fall;Cervical Precaution Booklet Issued: No Required Braces or Orthoses: Cervical Brace Cervical Brace: Hard collar;Other (comment);At all times(when OOB)      Mobility  Bed Mobility Overal bed mobility: Needs Assistance Bed Mobility: Rolling;Sidelying to Sit Rolling: +2 for safety/equipment;Max assist Sidelying to sit: +2 for safety/equipment;Total assist       General bed mobility comments: assist to flex knees and turn hips/shoulder with cues for technique,  assist for legs off bed and to lift trunk heavily up to EOB  Transfers Overall transfer level: Needs assistance   Transfers: Sit to/from Stand Sit to Stand: Max assist;+2 physical assistance         General transfer comment: extensor tone noted however pt attempting to hlep; Able to maintian stanidng in [place] for @ 1 min before asking pt to sit; Assist provided with gait belt and support under B axilla as pt is unable to hold bar of [place]  moved from bed to chair while up in [place]  Ambulation/Gait                Stairs            Wheelchair Mobility    Modified Rankin (Stroke Patients Only)       Balance Overall balance assessment: Needs assistance Sitting-balance support: Feet supported Sitting balance-Leahy Scale: Poor Sitting balance - Comments: Ableto maintain for seconds sitting EOB until leaning posteriorly; tone pushes into extension Postural control: Posterior lean Standing balance support: Single extremity supported;Bilateral upper extremity supported Standing balance-Leahy Scale: Poor Standing balance comment: assist to hold trunk upright while on stedy due to poor postural control                             Pertinent Vitals/Pain Pain Assessment: Faces Faces Pain Scale: Hurts little more Pain Location: leg spasms Pain Descriptors / Indicators: Spasm Pain Intervention(s): Limited activity within patient's tolerance;Repositioned    Home Living Family/patient expects to be discharged to:: Private residence Living Arrangements: Spouse/significant other Available Help at Discharge:  Available 24 hours/day Type of Home: House Home Access: Stairs to enter Entrance Stairs-Rails: Can reach both Entrance Stairs-Number of Steps: 3 @ back, 4 @ front Home Layout: One level Home Equipment: Walker - 2 wheels;Grab bars - tub/shower;Shower seat      Prior Function Level of Independence: Independent         Comments: loved gardening; Retired  from school [employer]   Dominant Hand: Right    Extremity/Trunk Assessment   Upper Extremity Assessment Upper Extremity Assessment: Defer to OT evaluation    Lower Extremity Assessment Lower Extremity Assessment: LLE deficits/detail;RLE deficits/detail RLE Deficits / Details: Seated able to perform LAQ through partial range, lifts from hip off bed in supine, but cannot maintain, increased extensor tone with flexion in bed difficult, but easier than L, no active ankle movement noted, Dorsiflexion PROM limited to approx 8 degrees from neutral. RLE Sensation: (needs further assessment) LLE Deficits / Details: Seated able to perform LAQ throught partial range, unable to lift leg in supine, increased extensor tone making flexion difficult in supine, demonstrates occasional spasms into extension while supine, no active ankle movement noted, passive movement limited by tightness in heel cords LLE Sensation: (needs further assessment)    Cervical / Trunk Assessment Cervical / Trunk Assessment: Other exceptions Cervical / Trunk Exceptions: s/p cervical decompression and fusion, history of lumbar surgery  Communication   Communication: No difficulties  Cognition Arousal/Alertness: Awake/alert Behavior During Therapy: WFL for tasks assessed/performed Overall Cognitive Status: Within Functional Limits for tasks assessed                                        General Comments General comments (skin integrity, edema, etc.): daughter pesent in room and OT instructing on ROM activities for hands and elevation due to edema    Exercises Other Exercises Other Exercises: PROM bilat ankles and heel slides in bed with tone inhibiting technique in hooklying hip rotation   Assessment/Plan    PT Assessment Patient needs continued PT services  PT Problem List Decreased strength;Decreased activity tolerance;Impaired tone;Decreased mobility;Decreased  balance;Decreased range of motion;Decreased knowledge of use of DME;Decreased coordination;Decreased knowledge of precautions       PT Treatment Interventions DME instruction;Therapeutic activities;Balance training;Patient/family education;Manual techniques;Neuromuscular re-education;Therapeutic exercise;Functional mobility training;Gait training    PT Goals (Current goals can be found in the Care Plan section)  Acute Rehab PT Goals Patient Stated Goal: to get better PT Goal Formulation: With patient/family Time For Goal Achievement: 09/12/19 Potential to Achieve Goals: Good    Frequency Min 4X/week   Barriers to discharge        Co-evaluation PT/OT/SLP Co-Evaluation/Treatment: Yes Reason for Co-Treatment: Complexity of the patient's impairments (multi-system involvement);For patient/therapist safety;To address functional/ADL transfers PT goals addressed during session: Mobility/safety with mobility;Balance;Proper use of DME         AM-PAC PT "6 Clicks" Mobility  Outcome Measure Help needed turning from your back to your side while in a flat bed without using bedrails?: Total Help needed moving from lying on your back to sitting on the side of a flat bed without using bedrails?: Total Help needed moving to and from a bed to a chair (including a wheelchair)?: Total Help needed standing up from a chair using your arms (e.g., wheelchair or bedside chair)?: Total Help needed to walk in hospital room?: Total Help needed climbing 3-5 steps  with a railing? : Total 6 Click Score: 6    End of Session Equipment Utilized During Treatment: Gait belt;Cervical collar Activity Tolerance: Patient tolerated treatment well Patient left: in chair;with call bell/phone within reach;with chair alarm set;with family/visitor present Nurse Communication: Need for lift equipment;Mobility status PT Visit Diagnosis: Other abnormalities of gait and mobility (R26.89);Other symptoms and signs involving  the nervous system (R29.898);Muscle weakness (generalized) (M62.81)    Time: 1018-1100 PT Time Calculation (min) (ACUTE ONLY): 42 min   Charges:   PT Evaluation $PT Eval High Complexity: 1 High          Magda Kiel, [place] [facility] [phone] 08/29/2019   Reginia Naas 08/29/2019, 3:32 PM

## 2019-08-29 NOTE — Progress Notes (Signed)
Patient ID: Meredith Roach, female   DOB: 05/09/1940, 79 y.o.   MRN: [id number] Looks good this morning.  She is doing better.  She describes less pain in her hands and arms.  Still has numbness in the hands.  She has good sensation throughout.  She can move her fingers somewhat today right greater than left.  She has no wrist extension on the left but has slight wrist extension on the right.  She has good biceps, with right 3 out of 5 and left 2 out of 5 triceps.  Deltoids are good.  She can move her toes on the left side and move her feet better today.  Still good quadricep strength at 3 out of 5, little more hip flexor strength this morning.  AP drain with 75 cc out.  We will continue this for 1 more day.  Pleased with her progress neurologically.  Physical and Occupational Therapy.  Will need rehab.  Collar when out of bed.

## 2019-08-29 NOTE — Progress Notes (Signed)
Orthopedic Tech Progress Note Patient Details:  Meredith Roach [birth year] [id number] Called in order to [facility] for [facility] Patient ID: Meredith Roach, female   DOB: [birth year], 79 y.o.   MRN: [id number]   Janit Pagan 08/29/2019, 1:06 PM

## 2019-08-29 NOTE — Progress Notes (Signed)
Occupational Therapy Evaluation Patient Details Name: Meredith Roach MRN: [id number] DOB: 17-Jul-1940 Today's Date: 08/29/2019    History of Present Illness  79 yo who fell while gardening in yard and struck her face and suffered a central cord syndrome.  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 Decompressive anterior cervical discectomy C3-4 C4-5 C5-6 C6-7, 2. Anterior cervical arthrodesis C3-4 C4-5 C5-6 C6-7 5/17. She has had a lumbar fusion in 2018    Clinical Impression   PTA, pt independent, lived with her husband and enjoyed gardening. BLE ace wrapped prior to mobilizing EOB. Pt moves into extensor posture at times due to abnormal tone. Max A +2 with sit - stand with use of Stedy but able to maintain standing upright with mod A +2. Total A at this time with ADL tasks due to inability to use hands due to motor and sensory deficits. VSS throughout session on RA. Pt would benefit form B Prevalon boots. May need splints for hands pending progress. Pt very motivated to "get better" and has excellent family support. Feel pt is appropriate for CIR when medically stable however will most likely need 24/7 assistance after DC. Will follow acutely.     Follow Up Recommendations  CIR;Supervision/Assistance - 24 hour    Equipment Recommendations  3 in 1 bedside commode;Wheelchair (measurements OT);Wheelchair cushion (measurements OT);Hospital bed    Recommendations for Other Services Rehab consult     Precautions / Restrictions Precautions Precautions: Cervical;Fall Precaution Booklet Issued: No Required Braces or Orthoses: Cervical Brace Cervical Brace: Hard collar;Other (comment);At all times(asked nurse for clarification. Note says at all times OOB) Restrictions Weight Bearing Restrictions: No      Mobility Bed Mobility      Total A +2 with rolling and sidelying  to sit            Transfers Overall transfer level:  Needs assistance   Transfers: Sit to/from Stand Sit to Stand: Max assist;+2 physical assistance         General transfer comment: extensor tone noted however pt attempting to help; Able to maintian standing in [place] for @ 1-2 min before asking pt to sit on paddles; Assist provided with gait belt and support under B axilla as pt is unable to hold bar of Stedy    Balance Overall balance assessment: Needs assistance   Sitting balance-Leahy Scale: Poor Sitting balance - Comments: Able to maintain for seconds sitting EOB until leaning posteriorly; tone - pushes into extension     Standing balance-Leahy Scale: Poor; appears to using tone to help with standing                             ADL either performed or assessed with clinical judgement   ADL Overall ADL's : Needs assistance/impaired Eating/Feeding: Total assistance   Grooming: Total assistance   Upper Body Bathing: Maximal assistance;Bed level   Lower Body Bathing: Total assistance;Bed level   Upper Body Dressing : Total assistance;Bed level   Lower Body Dressing: Total assistance;Bed level   Toilet Transfer: +2 for physical assistance;Maximal assistance(with use of Stedy)   Toileting- Clothing Manipulation and Hygiene: Total assistance Toileting - Clothing Manipulation Details (indicate cue type and reason): incontinent B/B     Functional mobility during ADLs: Maximal assistance;+2 for physical assistance(Stedy)       Vision Baseline Vision/History: Wears glasses Wears Glasses: Reading only  Perception     Praxis      Pertinent Vitals/Pain Pain Assessment: Faces Faces Pain Scale: Hurts little more Pain Location: leg spasms Pain Descriptors / Indicators: Spasm Pain Intervention(s): Limited activity within patient's tolerance;Repositioned     Hand Dominance Right   Extremity/Trunk Assessment Upper Extremity Assessment Upper Extremity Assessment: RUE deficits/detail;LUE  deficits/detail RUE Deficits / Details: able to complete hand to mouth supine but not sitting - lacks @ 3 inches form touching mouth; shoulder flex 35; abd 3/5; elbow flex 3/5; ext 3/5 (also has tone); wrist ext 2/5; flex 1/5; opssible 1/5 digit flex; no digit ext; no thumb movement RUE Sensation: decreased light touch;decreased proprioception RUE Coordination: decreased fine motor;decreased gross motor LUE Deficits / Details: able to complete hand to mouth and touch forehead; shoulder flex 3+/5; Abd 3+/5; elbow flex 3/5; ext 2+/5; pronation1/5; supination 3+/5; trace wrist ext/flex; no digit movement LUE Sensation: decreased proprioception;decreased light touch LUE Coordination: decreased fine motor;decreased gross motor   Lower Extremity Assessment Lower Extremity Assessment: Defer to PT evaluation   Cervical / Trunk Assessment Cervical / Trunk Assessment: Other exceptions(cervical sx; past back surgery)   Communication Communication Communication: No difficulties   Cognition Arousal/Alertness: Awake/alert Behavior During Therapy: WFL for tasks assessed/performed Overall Cognitive Status: Within Functional Limits for tasks assessed                                     General Comments       Exercises Exercises: Other exercises Other Exercises Other Exercises: pt/daughter educated on B elboe, wrist and hand ROM Other Exercises:  Educated on ankle flexion and holidng stretch for 15 seconds Other Exercises: Educated on positionoing of B hands to decrease dependent edema   Shoulder Instructions      Home Living Family/patient expects to be discharged to:: Private residence Living Arrangements: Spouse/significant other Available Help at Discharge: Available 24 hours/day Type of Home: House Home Access: Stairs to enter [employer] of Steps: 3 @ back, 4 @ front Entrance Stairs-Rails: Can reach both Home Layout: One level     Bathroom Shower/Tub:  Teacher, early years/pre: Handicapped height     Home Equipment: Environmental consultant - 2 wheels;Grab bars - tub/shower;Shower seat          Prior Functioning/Environment Level of Independence: Independent        Comments: loved gardening; Retired from TEFL teacher         OT Problem List: Decreased strength;Decreased range of motion;Decreased activity tolerance;Impaired balance (sitting and/or standing);Decreased coordination;Decreased safety awareness;Decreased knowledge of use of DME or AE;Decreased knowledge of precautions;Impaired sensation;Impaired tone;Impaired UE functional use;Pain;Increased edema      OT Treatment/Interventions: Self-care/ADL training;Therapeutic exercise;Neuromuscular education;DME and/or AE instruction;Splinting;Therapeutic activities;Patient/family education;Balance training    OT Goals(Current goals can be found in the care plan section) Acute Rehab OT Goals Patient Stated Goal: to get better OT Goal Formulation: With patient Time For Goal Achievement: 09/12/19 Potential to Achieve Goals: Good  OT Frequency: Min 2X/week   Barriers to D/C:            Co-evaluation PT/OT/SLP Co-Evaluation/Treatment: Yes Reason for Co-Treatment: Complexity of the patient's impairments (multi-system involvement);For patient/therapist safety;To address functional/ADL transfers   OT goals addressed during session: ADL's and self-care;Strengthening/ROM      AM-PAC OT "6 Clicks" Daily Activity     Outcome Measure Help from another person eating meals?: Total Help from another  person taking care of personal grooming?: Total Help from another person toileting, which includes using toliet, bedpan, or urinal?: Total Help from another person bathing (including washing, rinsing, drying)?: A Lot Help from another person to put on and taking off regular upper body clothing?: Total Help from another person to put on and taking off regular lower body  clothing?: Total 6 Click Score: 7   End of Session Equipment Utilized During Treatment: Cervical collar Nurse Communication: Mobility status;Need for lift equipment;Other (comment)(need clarification on cervical collar orders)  Activity Tolerance: Patient tolerated treatment well Patient left: in chair;with call bell/phone within reach;with chair alarm set;with family/visitor present  OT Visit Diagnosis: Other abnormalities of gait and mobility (R26.89);Muscle weakness (generalized) (M62.81);Pain Pain - part of body: Leg(spasms)                Time: [id number] OT Time Calculation (min): 51 min Charges:  OT General Charges $OT Visit: 1 Visit OT Evaluation $OT Eval High Complexity: 1 High OT Treatments $Therapeutic Activity: 8-22 mins  Maurie Boettcher, OT/L   Acute OT Clinical Specialist Acute Rehabilitation Services Pager [phone] Office [phone]   [facility] 08/29/2019, 11:38 AM

## 2019-08-29 NOTE — Progress Notes (Signed)
Uneventful overnight; pt tolerating liquids Denies any pain, only c/o paresthesias BUE and BLE  0700-1100 Pt w 2 daughters at bedside Transfer orders placed; awaiting PCU bed Foley should not be d/c'ed today, per Dr. Ronnald Ramp Full liquid breakfast tray; some difficulty with grits. SLP consulted. Will not advance diet until they see her Pills whole with thin liquids Up to chair with PT (see note); legs wrapped with compression C-collar on and aligned; pt educated on use when oob IS provided with education; 1000cc Family doing frequent PROM with pt  1100-1500 Remains up to chair Pt reporting subjective improvement in spasticity after starting baclofen this morning; daughter agrees that spasms are less frequent Family continues PROM and IS with pt  1500-1900 Questions about visitation policy answered with pt and daughter, [name] Pt's husband at bedside with yellow wristband. Pt and daughter, [name], understand that pt's other daughter, Joelene Millin, will be removed from visitor list so that pt's husband, [name], may visit. Pt back to bed with overhead lift; compression wraps and C-collar removed

## 2019-08-29 NOTE — Consult Note (Signed)
Physical Medicine and Rehabilitation Consult Reason for Consult: Quadriparesis Referring Physician: Dr. Sherley Bounds   HPI: Meredith Roach is a 79 y.o. right-handed female with history of gout as well as prediabetes.  Per chart review patient lives with spouse in [place].  Independent prior to admission.  1 level home.  Husband can assist.  Presented 08/26/2019 after ground-level fall with resultant quadriparesis.  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  CT/MRI of the brain no acute intracranial abnormality.  Admission chemistries unremarkable except glucose 123, CK 93.  Patient underwent decompressive anterior cervical discectomy C3-4, 4-5, C5-6, 6-7 with anterior cervical arthrodesis 08/28/2019 per Dr. Sherley Bounds.  Decadron protocol as indicated.  Aspen cervical collar applied as directed.  Physical occupational therapy evaluations pending.  MD has requested physical medicine rehab consult.   Review of Systems  Constitutional: Negative for chills and fever.  HENT: Negative for hearing loss.   Eyes: Negative for blurred vision and double vision.  Respiratory: Negative for shortness of breath.   Cardiovascular: Negative for chest pain, palpitations and leg swelling.  Gastrointestinal: Positive for constipation. Negative for heartburn, nausea and vomiting.       GERD  Genitourinary: Negative for dysuria, flank pain and hematuria.  Skin: Negative for rash.  Neurological: Positive for sensory change and weakness.  All other systems reviewed and are negative.  Past Medical History:  Diagnosis Date  . Arthritis   . GERD (gastroesophageal reflux disease)   . Gout   . Pre-diabetes    Past Surgical History:  Procedure Laterality Date  . CATARACT EXTRACTION W/PHACO Right 03/31/2018   Procedure: CATARACT EXTRACTION PHACO AND INTRAOCULAR LENS PLACEMENT ([place]);  Surgeon: [name], MD;  Location: [facility] ORS;  Service: Ophthalmology;  Laterality: Right;  [place] 00:39.7 CDE 4.35 Fluid Pack Lot # [id number]  . CHOLECYSTECTOMY  1995  . COLONOSCOPY WITH PROPOFOL N/A 06/07/2017   Procedure: COLONOSCOPY WITH PROPOFOL;  Surgeon: [name], MD;  Location: [facility] ENDOSCOPY;  Service: Endoscopy;  Laterality: N/A;  . ESOPHAGOGASTRODUODENOSCOPY (EGD) WITH PROPOFOL N/A 06/07/2017   Procedure: ESOPHAGOGASTRODUODENOSCOPY (EGD) WITH PROPOFOL;  Surgeon: [name], MD;  Location: [facility] ENDOSCOPY;  Service: Endoscopy;  Laterality: N/A;  . REDUCTION MAMMAPLASTY Bilateral 1994   Family History  Problem Relation Age of Onset  . Breast cancer Maternal Aunt   . Breast cancer Paternal Aunt   . CAD Father   . Heart attack Father    Social History:  reports that she has never smoked. She has never used smokeless tobacco. She reports that she does not drink alcohol or use drugs. Allergies:  Allergies  Allergen Reactions  . Ace Inhibitors Anaphylaxis   Medications Prior to Admission  Medication Sig Dispense Refill  . acetaminophen (TYLENOL) 500 MG tablet Take 1,000 mg by mouth daily as needed for moderate pain or headache.    . Glucosamine-Chondroit-Vit C-Mn (GLUCOSAMINE CHONDR 1500 COMPLX PO) Take 1 tablet by mouth 2 (two) times daily.    . hydrochlorothiazide (HYDRODIURIL) 25 MG tablet Take 12.5 mg by mouth daily.     Marland Kitchen omeprazole (PRILOSEC) 40 MG capsule Take 40 mg by mouth at bedtime.      Home:    Functional History:   Functional Status:  Mobility:          ADL:    Cognition: Cognition Orientation Level: Oriented X4    Blood pressure Marland Kitchen)  98/50, pulse 71, temperature 98.8 F (37.1 C), temperature source Oral, resp. rate 13, height 5\' 3"  (1.6 m), weight 85.3 kg, SpO2 99 %. Physical Exam  Constitutional: No distress.  Slumped in chair  HENT:  ACDF incision site clean and dry with drain in place  Eyes: Pupils are equal, round, and reactive to light. EOM  are normal.  Cardiovascular: Normal rate.  Respiratory: Effort normal.  GI: Soft.  Neurological:  Patient is alert in no acute distress.  Oriented x3. RUE: 3+ biceps, deltoid, 1-2 triceps, 1 wrist and tr-1 finger flexors. LUE: 3+ deltoid, biceps, trace triceps, 0 wrist and fingers. BLE grossly 1+ HE, KE and tr-1 ADF/PF. Decreased LT in all 4 limbs. DTR's 3+ LE's, frequent extensor spasms noted.   Skin: Skin is warm.  Psychiatric: She has a normal mood and affect. Her behavior is normal.    No results found for this or any previous visit (from the past 24 hour(s)). DG Cervical Spine 2-3 Views  Result Date: 08/28/2019 CLINICAL DATA:  Provided history: Surgery, elective. Surgery ACDF C3-C7. Provided fluoroscopy time 38 seconds. EXAM: CERVICAL SPINE - 2-3 VIEW; DG C-ARM 1-60 MIN COMPARISON:  Cervical spine MRI 06/26/2019 FINDINGS: Three fluoroscopic images of the cervical spine are submitted. The fluoroscopic images demonstrate sequela of C3-C7 ACDF with ventral fusion hardware and interbody spacers at these levels. Superimposition of the shoulders partially obscures the lower cervical levels on the lateral view images. No unexpected finding. Partially visualized support tubes. IMPRESSION: Intraoperative fluoroscopic images of the cervical spine from C3-C7 ACDF as described. No unexpected finding. Electronically Signed   By: Kellie Simmering DO   On: 08/28/2019 16:37   DG C-Arm 1-60 Min  Result Date: 08/28/2019 CLINICAL DATA:  Provided history: Surgery, elective. Surgery ACDF C3-C7. Provided fluoroscopy time 38 seconds. EXAM: CERVICAL SPINE - 2-3 VIEW; DG C-ARM 1-60 MIN COMPARISON:  Cervical spine MRI 06/26/2019 FINDINGS: Three fluoroscopic images of the cervical spine are submitted. The fluoroscopic images demonstrate sequela of C3-C7 ACDF with ventral fusion hardware and interbody spacers at these levels. Superimposition of the shoulders partially obscures the lower cervical levels on the lateral view  images. No unexpected finding. Partially visualized support tubes. IMPRESSION: Intraoperative fluoroscopic images of the cervical spine from C3-C7 ACDF as described. No unexpected finding. 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 Pt s/p ACDF 08/28/19 1. Does the need for close, 24 hr/day medical supervision in concert with the patient's rehab needs make it unreasonable for this patient to be served in a less intensive setting? Yes 2. Co-Morbidities requiring supervision/potential complications: DM, gout, neurogenic bowel and bladder.  3. Due to bladder management, bowel management, safety, skin/wound care, disease management, medication administration, pain management and patient education, does the patient require 24 hr/day rehab nursing? Yes 4. Does the patient require coordinated care of a physician, rehab nurse, therapy disciplines of PT, OT to address physical and functional deficits in the context of the above medical diagnosis(es)? Yes Addressing deficits in the following areas: balance, endurance, locomotion, strength, transferring, bowel/bladder control, bathing, dressing, feeding, grooming, toileting and psychosocial support 5. Can the patient actively participate in an intensive therapy program of at least 3 hrs of therapy per day at least 5 days per week? Yes 6. The potential for patient to make measurable gains while on inpatient rehab is excellent 7. Anticipated functional outcomes  upon discharge from inpatient rehab are min assist and mod assist  with PT, min assist, mod assist and max assist with OT, n/a with SLP. 8. Estimated rehab length of stay to reach the above functional goals is: 28 days 9. Anticipated discharge destination: Home 10. Overall Rehab/Functional Prognosis: excellent  RECOMMENDATIONS: This patient's condition is appropriate for  continued rehabilitative care in the following setting: CIR Patient has agreed to participate in recommended program. Yes Note that insurance prior authorization may be required for reimbursement for recommended care.  Comment: Rehab Admissions Coordinator to follow up.  Thanks,  Meredith Staggers, MD, Mellody Drown  I have personally performed a face to face diagnostic evaluation of this patient. Additionally, I have examined pertinent labs and radiographic images. I have reviewed and concur with the physician assistant's documentation above.    Lavon Paganini Angiulli, PA-C 08/29/2019

## 2019-08-30 ENCOUNTER — Encounter: Payer: Self-pay | Admitting: *Deleted

## 2019-08-30 NOTE — Evaluation (Signed)
Clinical/Bedside Swallow Evaluation Patient Details  Name: Meredith Roach MRN: [id number] Date of Birth: Mar 11, 1941  Today's Date: 08/30/2019 Time: SLP Start Time (ACUTE ONLY): 1051 SLP Stop Time (ACUTE ONLY): 1111 SLP Time Calculation (min) (ACUTE ONLY): 20 min  Past Medical History:  Past Medical History:  Diagnosis Date  . Arthritis   . GERD (gastroesophageal reflux disease)   . Gout   . Pre-diabetes    Past Surgical History:  Past Surgical History:  Procedure Laterality Date  . ANTERIOR CERVICAL DECOMPRESSION/DISCECTOMY FUSION 4 LEVELS N/A 08/28/2019   Procedure: ANTERIOR CERVICAL DECOMPRESSION/DISCECTOMY FUSION CERVICALTHREE-FOUR CERVICAL,FOUR-FIVE,CERVICAL FIVE-SIX,CERVICAL SIX-SEVEN.;  Surgeon: [name], MD;  Location: [place];  Service: Neurosurgery;  Laterality: N/A;  ANTERIOR  . CATARACT EXTRACTION W/PHACO Right 03/31/2018   Procedure: CATARACT EXTRACTION PHACO AND INTRAOCULAR LENS PLACEMENT ([place]);  Surgeon: [name], MD;  Location: [facility] ORS;  Service: Ophthalmology;  Laterality: Right;  [place] 00:39.7 CDE 4.35 Fluid Pack Lot # [id number]  . CHOLECYSTECTOMY  1995  . COLONOSCOPY WITH PROPOFOL N/A 06/07/2017   Procedure: COLONOSCOPY WITH PROPOFOL;  Surgeon: [name], MD;  Location: [facility] ENDOSCOPY;  Service: Endoscopy;  Laterality: N/A;  . ESOPHAGOGASTRODUODENOSCOPY (EGD) WITH PROPOFOL N/A 06/07/2017   Procedure: ESOPHAGOGASTRODUODENOSCOPY (EGD) WITH PROPOFOL;  Surgeon: [name], MD;  Location: [facility] ENDOSCOPY;  Service: Endoscopy;  Laterality: N/A;  . REDUCTION MAMMAPLASTY Bilateral 1994   HPI:  79 yo who fell while gardening in yard and struck her face and suffered a central cord syndrome.  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 Decompressive anterior cervical discectomy C3-4 C4-5 C5-6 C6-7, 2. Anterior cervical arthrodesis C3-4 C4-5 C5-6 C6-7 5/17. She has had a lumbar fusion in 2018     Assessment / Plan / Recommendation Clinical Impression   Pt's cranial nerve exam is unremarkable and her swallow function appears to be functional across thin liquids and softer solids/purees. With trials of drier, more solid textures, she has frequent throat clearing. Suspect she may have some post-operative edema; pt also reports mild, subjective difficulty swallowing with her collar in place. From my standpoint I think she could advance up to Dys 2 (chopped) diet and thin liquids. I think with additional time she will have good prognosis to advance, but she will benefit from brief SLP f/u as she tries more solid textures.   SLP Visit Diagnosis: Dysphagia, unspecified (R13.10)    Aspiration Risk  Mild aspiration risk    Diet Recommendation Dysphagia 2 (Fine chop);Thin liquid   Liquid Administration via: Straw Medication Administration: Whole meds with liquid Supervision: Staff to assist with self feeding Compensations: Slow rate;Small sips/bites Postural Changes: Seated upright at 90 degrees    Other  Recommendations Oral Care Recommendations: Oral care BID   Follow up Recommendations Inpatient Rehab      Frequency and Duration min 2x/week  1 week       Prognosis Prognosis for Safe Diet Advancement: Good      Swallow Study   General HPI: 79 yo who fell while gardening in yard and struck her face and suffered a central cord syndrome.  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 Decompressive anterior cervical discectomy C3-4 C4-5 C5-6 C6-7, 2. Anterior cervical arthrodesis C3-4 C4-5 C5-6 C6-7 5/17. She has had a lumbar fusion in 2018  Type of Study: Bedside Swallow Evaluation Previous Swallow Assessment: none in chart  Diet Prior to this Study: Thin liquids(full liquid diet) Temperature Spikes Noted: No Respiratory Status: Room air History of Recent Intubation: No Behavior/Cognition: Alert;Cooperative;Pleasant  mood Oral Cavity Assessment: Within Functional Limits Oral Care Completed by SLP: No Oral Cavity - Dentition: Adequate natural dentition Vision: Functional for self-feeding Self-Feeding Abilities: Total assist Patient Positioning: Upright in chair Baseline Vocal Quality: Normal Volitional Swallow: Able to elicit    Oral/Motor/Sensory Function Overall Oral Motor/Sensory Function: Within functional limits   Ice Chips Ice chips: Not tested   Thin Liquid Thin Liquid: Within functional limits Presentation: Straw    Nectar Thick Nectar Thick Liquid: Not tested   Honey Thick Honey Thick Liquid: Not tested   Puree Puree: Within functional limits Presentation: Spoon   Solid     Solid: Impaired Pharyngeal Phase Impairments: Throat Clearing - Immediate;Throat Clearing - Delayed       Osie Bond., M.A. [place] Acute Rehabilitation Services Pager [phone] Office [phone]  08/30/2019,11:38 AM

## 2019-08-30 NOTE — Progress Notes (Signed)
Inpatient Rehab Admissions:  Inpatient Rehab Consult received.  I met with patient and her daughter at the bedside for rehabilitation assessment and to discuss goals and expectations of an inpatient rehab admission.  Pt and family are very interested in [facility] program and family can provide supervision to min assist at discharge, if needed.  Will begin insurance authorization today for possible admission tomorrow pending bed availability and insurance approval.   Signed: Shann Medal, PT, DPT Admissions Coordinator [phone] 08/30/19  11:11 AM

## 2019-08-30 NOTE — Progress Notes (Signed)
Occupational Therapy Treatment Note  BLE ace wrapped prior to mobilizing to EOB. Pt with improved trunk control EOB. BUE ROM improving, RUE stronger than L and pt stronger in C5-6 distribution. Less spasms today as compared to yesterday. VSS throughout. Daughter presetn during session and very involved in care. Pt extremely motivated but stated "I think this is going to take longer to get better than I thought". Will return later to begin work with AE to enable pt to begin helping to feed herself.  08/30/19 1049  OT Visit Information  Last OT Received On 08/30/19  Assistance Needed +2  PT/OT/SLP Co-Evaluation/Treatment Yes  Reason for Co-Treatment Complexity of the patient's impairments (multi-system involvement);For patient/therapist safety;To address functional/ADL transfers  OT goals addressed during session ADL's and self-care;Proper use of Adaptive equipment and DME;Strengthening/ROM  History of Present Illness  79 yo who fell while gardening in yard and struck her face and suffered a central cord syndrome.  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 Decompressive anterior cervical discectomy C3-4 C4-5 C5-6 C6-7, 2. Anterior cervical arthrodesis C3-4 C4-5 C5-6 C6-7 5/17. She has had a lumbar fusion in 2018   Precautions  Precautions Fall;Cervical  Precaution Booklet Issued No  Required Braces or Orthoses Cervical Brace  Cervical Brace Hard collar (when up OOB)  Pain Assessment  Pain Assessment Faces  Faces Pain Scale 4  Pain Location leg spasms  Pain Descriptors / Indicators Spasm  Pain Intervention(s) Limited activity within patient's tolerance  Cognition  Arousal/Alertness Awake/alert  Behavior During Therapy WFL for tasks assessed/performed  Overall Cognitive Status Within Functional Limits for tasks assessed  Lower Extremity Assessment  Lower Extremity Assessment Defer to PT evaluation  ADL  Overall ADL's  Needs  assistance/impaired  Grooming Maximal assistance  Grooming Details (indicate cue type and reason) able to use supination and weak tenodesis to bring cloth to mouth  Functional mobility during ADLs +2 for physical assistance;Maximal assistance  Bed Mobility  Overal bed mobility Needs Assistance  Bed Mobility Rolling;Sidelying to Sit  Rolling +2 for physical assistance;Max assist  Sidelying to sit Max assist;+2 for physical assistance  General bed mobility comments pt attempted to move bilat UEs and LEs to assist in transfer but ultimated requiring maxAX2 for LE management and trunk elevation  Balance  Overall balance assessment Needs assistance  Sitting-balance support Feet supported  Sitting balance-Leahy Scale Poor  Sitting balance - Comments worked on maintaining static sitting balance, less retropulsion today, pt able to self correct to midline/neutral position with verbal cues. worked on leaning side to side and pushing up with hands, pt unable to maintain balance >5sec without requiring support.   Standing balance support Bilateral upper extremity supported  Standing balance-Leahy Scale Poor  Standing balance comment pt tolerated standing x 2 min, PT focused on bilat LE support and achieving bilat knee extension while OT focuse on trunk and upper body support. Also completed 5 min sit to stand with modAx2, noted active contraction of glut muscles  Restrictions  Weight Bearing Restrictions No  Transfers  Overall transfer level Needs assistance  Transfer via Lift Equipment Stedy  Transfers Sit to/from Stand  Sit to Stand Mod assist;+2 physical assistance  General transfer comment extensor tone noted, pt able power up, assist needed at bilat UEs as pt unable to grip and hold onto bar of stedy, supported pt under bilat axilla and at hips with bed pad  General Comments  General comments (  skin integrity, edema, etc.) BUE strength improving. Strongest in C5-6 distribution however pt  beginning to demonstrate wrist extension and minimal thumb & finger movement R hand; able to touch hand to mouth/head with BUE today; L hand overall weaker; trace wrist extension; did not observe digit movement; not demonstrating tenodesis grasp; parasthesias BUE; movement improves when pt using visiondaughter present for education  Exercises  Exercises General Upper Extremity  General Exercises - Upper Extremity  Shoulder Flexion Both;AAROM;5 reps;Supine  Shoulder ABduction AAROM;Both;5 reps;Supine;Seated  Elbow Flexion AROM;AAROM;Both;10 reps;Seated  Elbow Extension AROM;AAROM;Both;10 reps;Seated  Wrist Flexion AROM;AAROM;Both;10 reps;Seated  Wrist Extension PROM;AAROM;Both;10 reps;Seated  Digit Composite Flexion PROM;AAROM;Both;10 reps;Seated  Composite Extension PROM;AAROM;Both;10 reps;Seated  General Exercises - Lower Extremity  Long Arc Quad  (about 1/2 ROM)  Other Exercises  Other Exercises While sitting EOB, pt leaning side to side to push up through elbows to work on trunk control  Other Exercises pt/daughter educated on towel slides and using tenodesis grasp R hand to bring washcloth to face  Other Exercises gross grasp/release R hand  OT - End of Session  Equipment Utilized During Treatment Gait belt;Cervical collar  Activity Tolerance Patient tolerated treatment well  Patient left in chair;with call bell/phone within reach;with chair alarm set;with family/visitor present  Nurse Communication Mobility status;Need for lift equipment;Precautions  OT Assessment/Plan  OT Plan Discharge plan remains appropriate  OT Visit Diagnosis Other abnormalities of gait and mobility (R26.89);Muscle weakness (generalized) (M62.81);Pain  Pain - part of body Leg (spasms)  OT Frequency (ACUTE ONLY) Min 2X/week  Recommendations for Other Services Rehab consult  Follow Up Recommendations CIR;Supervision/Assistance - 24 hour  OT Equipment 3 in 1 bedside commode;Wheelchair (measurements  OT);Wheelchair cushion (measurements OT);Hospital bed  AM-PAC OT "6 Clicks" Daily Activity Outcome Measure (Version 2)  Help from another person eating meals? 1  Help from another person taking care of personal grooming? 2  Help from another person toileting, which includes using toliet, bedpan, or urinal? 1  Help from another person bathing (including washing, rinsing, drying)? 1  Help from another person to put on and taking off regular upper body clothing? 1  Help from another person to put on and taking off regular lower body clothing? 1  6 Click Score 7  OT Goal Progression  Progress towards OT goals Progressing toward goals  Acute Rehab OT Goals  Patient Stated Goal to get better  OT Goal Formulation With patient  Time For Goal Achievement 09/12/19  Potential to Achieve Goals Good  ADL Goals  Pt Will Perform Eating with mod assist;with assist to don/doff brace/orthosis;with adaptive utensils;sitting  Pt Will Perform Grooming with mod assist;sitting;with adaptive equipment  Pt Will Perform Upper Body Bathing with mod assist;with adaptive equipment;sitting  Pt Will Transfer to Toilet with +2 assist;with mod assist;bedside commode;squat pivot transfer  Pt/caregiver will Perform Home Exercise Program Increased strength;Both right and left upper extremity;With minimal assist;With written HEP provided  Additional ADL Goal #1 Pt will independently instruct caregivers/staff in proper positioning of BU/LE for edema and contracture management  OT Time Calculation  OT Start Time (ACUTE ONLY) 0934  OT Stop Time (ACUTE ONLY) 1032  OT Time Calculation (min) 58 min  OT General Charges  $OT Visit 1 Visit  OT Treatments  $Neuromuscular Re-education 23-37 mins  Maurie Boettcher, OT/L   Acute OT Clinical Specialist [place] Pager [phone] Office [phone]

## 2019-08-30 NOTE — Progress Notes (Signed)
Pt back to bed at 1715.

## 2019-08-30 NOTE — PMR Pre-admission (Addendum)
PMR Admission Coordinator Pre-Admission Assessment  Patient: Meredith Roach is an 79 y.o., female MRN: [id number] DOB: [birth year] Height: '5\' 3"'  (160 cm) Weight: 85.3 kg              Insurance Information HMO:     PPO: yes     PCP:      IPA:      80/20:      OTHER:  PRIMARY: [employer]      Policy#: [id number]      Subscriber: pt CM Name: Lattie Haw      Phone#: [phone] ext [phone]     Fax#:  [phone] Pre-Cert#: [id number] [place] for CIR provided by Lattie Haw at [facility] with updates due to Lestine Box on 5/26 to fax listed above Jeanette Caprice phone [phone] ext [phone]      Employer:  Benefits:  Phone #: [phone]     Name:  Eff. Date: 04/14/19     Deduct: $0      Out of Pocket Max: $4000 (met $200)      Life Max: n/a  CIR: $160/day for days 1-10      SNF: 20 full days Outpatient:      Co-Pay: $20/visit Home Health: 100%      Co-Pay:  DME: 80%     Co-Pay: 20% Providers:  SECONDARY:       Policy#:       Phone#:   Development worker, community:       Phone#:   The "Data Collection Information Summary" for patients in Inpatient Rehabilitation Facilities with attached "Privacy Act [place] Records" was provided and verbally reviewed with: Patient and Family  Emergency Contact Information Contact Information    Name Relation Home Work [place] H Spouse [phone]     Haynes Bast Daughter [phone]     Othelia Pulling Daughter   [phone]     Current Medical History  Patient Admitting Diagnosis: central cord syndrome  History of Present Illness:  Meredith Roach is a 79 y.o. right-handed female with history of gout as well as prediabetes.  Presented 08/26/2019 after ground-level fall with resultant quadriparesis.  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  CT/MRI of the brain no acute intracranial abnormality.  Admission chemistries unremarkable except glucose  123, CK 93.  Patient underwent decompressive anterior cervical discectomy C3-4, 4-5, C5-6, 6-7 with anterior cervical arthrodesis 08/28/2019 per Dr. Sherley Bounds.  Decadron protocol as indicated.  Aspen cervical collar applied as directed.  Physical occupational therapy evaluations completed and recommendations were made for CIR due to functional decline due to spinal cord injury.   Past Medical History  Past Medical History:  Diagnosis Date  . Arthritis   . GERD (gastroesophageal reflux disease)   . Gout   . Pre-diabetes     Family History  family history includes Breast cancer in her maternal aunt and paternal aunt; CAD in her father; Heart attack in her father.  Prior Rehab/Hospitalizations:  Has the patient had prior rehab or hospitalizations prior to admission? No  Has the patient had major surgery during 100 days prior to admission? Yes  Current Medications   Current Facility-Administered Medications:  .  0.9 %  sodium chloride infusion, 250 mL, Intravenous, Continuous, [name], MD .  0.9 % NaCl with KCl 20 mEq/ L  infusion, , Intravenous, Continuous, [name], MD, Stopped at 08/29/19 [phone] .  acetaminophen (TYLENOL) tablet  650 mg, 650 mg, Oral, Q4H PRN **OR** acetaminophen (TYLENOL) suppository 650 mg, 650 mg, Rectal, Q4H PRN, [name], MD .  baclofen (LIORESAL) tablet 5 mg, 5 mg, Oral, TID, [name], MD, 5 mg at 08/30/19 1057 .  Chlorhexidine Gluconate Cloth 2 % PADS 6 each, 6 each, Topical, Daily, [name], MD, 6 each at 08/30/19 1054 .  dexamethasone (DECADRON) injection 4 mg, 4 mg, Intravenous, Q6H, 4 mg at 08/30/19 0420 **OR** dexamethasone (DECADRON) tablet 4 mg, 4 mg, Oral, Q6H, [name], MD, 4 mg at 08/30/19 1057 .  MEDLINE mouth rinse, 15 mL, Mouth Rinse, BID, [name], MD, 15 mL at 08/30/19 0043 .  menthol-cetylpyridinium (CEPACOL) lozenge 3 mg, 1 lozenge, Oral, PRN, 3 mg at 08/29/19 2356 **OR** phenol (CHLORASEPTIC) mouth spray 1  spray, 1 spray, Mouth/Throat, PRN, [name], MD .  morphine 2 MG/ML injection 2 mg, 2 mg, Intravenous, Q2H PRN, [name], MD .  ondansetron [facility]) tablet 4 mg, 4 mg, Oral, Q6H PRN **OR** ondansetron (ZOFRAN) injection 4 mg, 4 mg, Intravenous, Q6H PRN, [name], MD .  oxyCODONE (Oxy IR/ROXICODONE) immediate release tablet 10 mg, 10 mg, Oral, Q3H PRN, [name], MD .  senna [facility]) tablet 8.6 mg, 1 tablet, Oral, BID, [name], MD, 8.6 mg at 08/30/19 1057 .  sodium chloride flush (NS) 0.9 % injection 3 mL, 3 mL, Intravenous, Q12H, [name], MD, 3 mL at 08/30/19 1057 .  sodium chloride flush (NS) 0.9 % injection 3 mL, 3 mL, Intravenous, PRN, [name], MD  Patients Current Diet:  Diet Order            Diet full liquid Room service appropriate? Yes; Fluid consistency: Thin  Diet effective now              Precautions / Restrictions Precautions Precautions: Fall, Cervical Precaution Booklet Issued: No Cervical Brace: Hard collar(when up OOB) Restrictions Weight Bearing Restrictions: No   Has the patient had 2 or more falls or a fall with injury in the past year?Yes, the fall that led to this admission.   Prior Activity Level Community (5-7x/wk): active prior to [employer], driving, no DME used  Prior Functional Level Prior Function Level of Independence: Independent Comments: loved gardening; Retired from [employer]: Did the patient need help bathing, dressing, using the toilet or eating?  Independent  Indoor Mobility: Did the patient need assistance with walking from room to room (with or without device)? Independent  Stairs: Did the patient need assistance with internal or external stairs (with or without device)? Independent  Functional Cognition: Did the patient need help planning regular tasks such as shopping or remembering to take medications? Independent  Home Assistive Devices / Equipment Home  Equipment: Environmental consultant - 2 wheels, Grab bars - tub/shower, Shower seat  Prior Device Use: Indicate devices/aids used by the patient prior to current illness, exacerbation or injury? None of the above  Current Functional Level Cognition  Overall Cognitive Status: Within Functional Limits for tasks assessed Orientation Level: Oriented X4    Extremity Assessment (includes Sensation/Coordination)  Upper Extremity Assessment: Defer to OT evaluation RUE Deficits / Details: able to complete hand to mouth supine but not sitting - lacks @ 3 inches form touching mouth; shoulder flex 35; abd 3/5; elbow flex 3/5; ext 3/5 (also has tone); wrist ext 2/5; flex 1/5; opssible 1/5 digit flex; no digit ext; no thumb  movement RUE Sensation: decreased light touch, decreased proprioception RUE Coordination: decreased fine motor, decreased gross motor LUE Deficits / Details: able to complete hand to mouth and touch forehead; shoulder flex 3+/5; Abd 3+/5; elbow flex 3/5; ext 2+/5; pronation1/5; supination 3+/5; trace wrist ext/flex; no digit movement LUE Sensation: decreased proprioception, decreased light touch LUE Coordination: decreased fine motor, decreased gross motor  Lower Extremity Assessment: Defer to PT evaluation RLE Deficits / Details: Seated able to perform LAQ through partial range, lifts from hip off bed in supine, but cannot maintain, increased extensor tone with flexion in bed difficult, but easier than L, no active ankle movement noted, Dorsiflexion PROM limited to approx 8 degrees from neutral. RLE Sensation: (needs further assessment) LLE Deficits / Details: Seated able to perform LAQ throught partial range, unable to lift leg in supine, increased extensor tone making flexion difficult in supine, demonstrates occasional spasms into extension while supine, no active ankle movement noted, passive movement limited by tightness in heel cords LLE Sensation: (needs further assessment)    ADLs  Overall  ADL's : Needs assistance/impaired Eating/Feeding: Total assistance Grooming: Maximal assistance Grooming Details (indicate cue type and reason): able to use supination and weak tenodesis to bring cloth to mouth Upper Body Bathing: Maximal assistance, Bed level Lower Body Bathing: Total assistance, Bed level Upper Body Dressing : Total assistance, Bed level Lower Body Dressing: Total assistance, Bed level Toilet Transfer: +2 for physical assistance, Maximal assistance(with use of Stedy) Toileting- Clothing Manipulation and Hygiene: Total assistance Toileting - Clothing Manipulation Details (indicate cue type and reason): incontinent B/B Functional mobility during ADLs: +2 for physical assistance, Maximal assistance    Mobility  Overal bed mobility: Needs Assistance Bed Mobility: Rolling, Sidelying to Sit Rolling: +2 for physical assistance, Max assist Sidelying to sit: Max assist, +2 for physical assistance General bed mobility comments: pt attempted to move bilat UEs and LEs to assist in transfer but ultimated requiring maxAX2 for LE management and trunk elevation    Transfers  Overall transfer level: Needs assistance Transfer via Lift Equipment: Stedy Transfers: Sit to/from Stand Sit to Stand: Mod assist, +2 physical assistance General transfer comment: extensor tone noted, pt able power up, assist needed at bilat UEs as pt unable to grip and hold onto bar of stedy, supported pt under bilat axilla and at hips with bed pad    Ambulation / Gait / Stairs / Wheelchair Mobility  Ambulation/Gait General Gait Details: unable at this time    Posture / Balance Dynamic Sitting Balance Sitting balance - Comments: worked on maintaining static sitting balance, less retropulsion today, pt able to self correct to midline/neutral position with verbal cues. worked on leaning side to side and pushing up with hands, pt unable to maintain balance >5sec without requiring support.  Balance Overall  balance assessment: Needs assistance Sitting-balance support: Feet supported Sitting balance-Leahy Scale: Poor Sitting balance - Comments: worked on maintaining static sitting balance, less retropulsion today, pt able to self correct to midline/neutral position with verbal cues. worked on leaning side to side and pushing up with hands, pt unable to maintain balance >5sec without requiring support.  Postural control: Posterior lean Standing balance support: Bilateral upper extremity supported Standing balance-Leahy Scale: Poor Standing balance comment: pt tolerated standing x 2 min, PT focused on bilat LE support and achieving bilat knee extension while OT focuse on trunk and upper body support. Also completed 5 min sit to stand with modAx2, noted active contraction of glut muscles    Special needs/care  consideration Designated visitor [name] and [name] ([name] lives in [place] and would like to alternate with [name] if approved by MD)     Previous Home Environment (from acute therapy documentation) Living Arrangements: Spouse/significant other Available Help at Discharge: Available 24 hours/day Type of Home: [place]: One level Home Access: Stairs to enter Entrance Stairs-Rails: Can reach both Entrance Stairs-Number of Steps: 3 @ back, 4 @ front Bathroom Shower/Tub: Chiropodist: Handicapped height  Discharge Living Setting Plans for Discharge Living Setting: Patient's home Type of Home at Discharge: House Discharge Home Layout: One level Discharge Home Access: Stairs to enter Entrance Stairs-Rails: Left, Right Entrance Stairs-Number of Steps: 3-4 Discharge Bathroom Shower/Tub: Pullman unit Discharge Bathroom Toilet: Handicapped height Discharge Bathroom Accessibility: Yes How Accessible: Accessible via walker Does the patient have any problems obtaining your medications?: No  Social/Family/Support Systems Patient Roles: Spouse Anticipated Caregiver:  spouse [name]), daughters [name] and [name]) Anticipated Caregiver's Contact Information: [name] [phone]; [name] [phone], [name] [phone] Ability/Limitations of Caregiver: [name] can provide supervision for mobility and up to mod assist for ADLs; [name] is able to assist if needed.  Karrie lives in [place] but can provide intermittent assist Caregiver Availability: 24/7 Discharge Plan Discussed with Primary Caregiver: Yes Is Caregiver In Agreement with Plan?: Yes Does Caregiver/Family have Issues with Lodging/Transportation while Pt is in Rehab?: No   Goals Patient/Family Goal for Rehab: PT/OT min/mod assist, SLP n/a Expected length of stay: 28-30 days Pt/Family Agrees to Admission and willing to participate: Yes Program Orientation Provided & Reviewed with Pt/Caregiver Including Roles  & Responsibilities: Yes  Barriers to Discharge: Insurance for SNF coverage, Inaccessible home environment   Decrease burden of Care through IP rehab admission: n/a  Possible need for SNF placement upon discharge: Not anticipated.  Pt with good family support and based on progress with therapies, has potential to reach an assistance level that family can manage at home.   Patient Condition: I have reviewed medical records from [facility], spoken with CM, and patient and daughter. I met with patient at the bedside for inpatient rehabilitation assessment.  Patient will benefit from ongoing PT and OT, can actively participate in 3 hours of therapy a day 5 days of the week, and can make measurable gains during the admission.  Patient will also benefit from the coordinated team approach during an Inpatient Acute Rehabilitation admission.  The patient will receive intensive therapy as well as Rehabilitation physician, nursing, social worker, and care management interventions.  Due to bladder management, bowel management, safety, skin/wound care, medication administration, pain management and patient education  the patient requires 24 hour a day rehabilitation nursing.  The patient is currently mod +2 with mobility and max +2 with basic ADLs.  Discharge setting and therapy post discharge at home with home health is anticipated.  Patient has agreed to participate in the Acute Inpatient Rehabilitation Program and will admit pending insurance authorization.  Preadmission Screen Completed By:  Michel Santee, PT, 08/30/2019 11:19 AM ______________________________________________________________________   Discussed status with Dr. Dagoberto Ligas on 08/31/19 at 9:56 AM and received approval for admission today.  Admission Coordinator:  Michel Santee, PT, DPT time 9:56 AM Sudie Grumbling 08/31/19

## 2019-08-30 NOTE — Progress Notes (Signed)
Occupational therapy Treatment Note  Session focused on beginning self feeding with use of AE. Pt provided with adapted cup and U-cuff. Able to drink from cup using both hands and able to complete hand to mouth pattern using U-cuff. Daughter present for education. Pt very pleased with her ability to do something for herself.     08/30/19 1400  OT Visit Information  Last OT Received On 08/30/19  Assistance Needed +2  History of Present Illness  79 yo who fell while gardening in yard and struck her face and suffered a central cord syndrome.  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 Decompressive anterior cervical discectomy C3-4 C4-5 C5-6 C6-7, 2. Anterior cervical arthrodesis C3-4 C4-5 C5-6 C6-7 5/17. She has had a lumbar fusion in 2018   Precautions  Precautions Fall;Cervical  Precaution Booklet Issued No  Required Braces or Orthoses Cervical Brace  Cervical Brace Hard collar  Pain Assessment  Pain Assessment Faces  Faces Pain Scale 4  Pain Location leg spasms  Pain Descriptors / Indicators Spasm  Pain Intervention(s) Limited activity within patient's tolerance  Cognition  Arousal/Alertness Awake/alert  Behavior During Therapy WFL for tasks assessed/performed  Overall Cognitive Status Within Functional Limits for tasks assessed  ADL  General ADL Comments Session focused on self feeding and pt/family education. Issued lidded cup/adapted with theraband to increase traction; pt began working with built up U-cuff for table to mouth and able to bring spoon to mouth; trialed WHO splint however pt felt it was more limiting - may need WHO if wrist control does not improve. Kinesiotape used to assist with wrist extension. Daughter present for education  OT - End of Session  Equipment Utilized During Treatment Other (comment) (AE)  Activity Tolerance Patient tolerated treatment well  Patient left in chair;with call bell/phone within  reach;with family/visitor present  Nurse Communication Mobility status;Need for lift equipment  OT Assessment/Plan  OT Plan Discharge plan remains appropriate  OT Visit Diagnosis Other abnormalities of gait and mobility (R26.89);Muscle weakness (generalized) (M62.81);Pain  Pain - part of body Leg  OT Frequency (ACUTE ONLY) Min 2X/week  Recommendations for Other Services Rehab consult  Follow Up Recommendations CIR;Supervision/Assistance - 24 hour  OT Equipment 3 in 1 bedside commode;Wheelchair (measurements OT);Wheelchair cushion (measurements OT);Hospital bed  AM-PAC OT "6 Clicks" Daily Activity Outcome Measure (Version 2)  Help from another person eating meals? 2  Help from another person taking care of personal grooming? 2  Help from another person toileting, which includes using toliet, bedpan, or urinal? 1  Help from another person bathing (including washing, rinsing, drying)? 1  Help from another person to put on and taking off regular upper body clothing? 1  Help from another person to put on and taking off regular lower body clothing? 1  6 Click Score 8  OT Goal Progression  Progress towards OT goals Progressing toward goals  Acute Rehab OT Goals  Patient Stated Goal to get better  OT Goal Formulation With patient  Time For Goal Achievement 09/12/19  Potential to Achieve Goals Good  ADL Goals  Pt Will Perform Eating with mod assist;with assist to don/doff brace/orthosis;with adaptive utensils;sitting  Pt Will Perform Grooming with mod assist;sitting;with adaptive equipment  Pt Will Perform Upper Body Bathing with mod assist;with adaptive equipment;sitting  Pt Will Transfer to Toilet with +2 assist;with mod assist;bedside commode;squat pivot transfer  Pt/caregiver will Perform Home Exercise Program Increased strength;Both right and left upper  extremity;With minimal assist;With written HEP provided  Additional ADL Goal #1 Pt will independently instruct caregivers/staff in proper  positioning of BU/LE for edema and contracture management  OT Time Calculation  OT Start Time (ACUTE ONLY) 1210  OT Stop Time (ACUTE ONLY) 1236  OT Time Calculation (min) 26 min  OT General Charges  $OT Visit 1 Visit  OT Treatments  $Self Care/Home Management  23-37 mins  Maurie Boettcher, OT/L   Acute OT Clinical Specialist [place] Pager [phone] Office [phone]

## 2019-08-30 NOTE — H&P (Signed)
Physical Medicine and Rehabilitation Admission H&P    Chief Complaint  Patient presents with  . Numbness  . Fall  . Weakness  : HPI: Meredith Roach is a 79 y.o.right handed female with a history of gout as well as pre-diabetes.Per chart review she lives with spouse in [place].Independent prior to admission.  1 level home.  Husband can assist.  Presented 08/26/2019 after ground-level fall with resulting quadriparesis.  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  CT/MRI of the brain no acute intracranial abnormality.  Admission chemistries unremarkable except glucose 123, CK of 93.  Patient underwent decompressive anterior cervical discectomy C3-4 4-5, C5-6 and C6-7 with anterior cervical arthrodesis 08/28/2019 per Dr. Sherley Bounds.  Decadron protocol as indicated.  Aspen cervical collar applied as directed.  Therapy evaluations completed and patient was admitted for a comprehensive rehab program   Has foley due to urinary retention since fall- also can't tell when needs to have BM- and cannot control it.  Sleeping OK No significant pain Having a lot of spasms/tone- annoying and gets in the way.  Of function/moving.  Hands up to elbows are real numb- has been this way a long time- since before the fall.     Review of Systems  Constitutional: Negative for chills and fever.  HENT: Negative for hearing loss.   Eyes: Negative for blurred vision and double vision.  Respiratory: Negative for cough and shortness of breath.   Cardiovascular: Negative for chest pain, palpitations and leg swelling.  Gastrointestinal: Positive for constipation. Negative for heartburn, nausea and vomiting.       GERD  Genitourinary: Negative for dysuria, flank pain and hematuria.  Musculoskeletal: Positive for myalgias.  Skin: Negative for rash.  Neurological: Positive for sensory change and weakness.  All other  systems reviewed and are negative.  Past Medical History:  Diagnosis Date  . Arthritis   . GERD (gastroesophageal reflux disease)   . Gout   . Pre-diabetes    Past Surgical History:  Procedure Laterality Date  . ANTERIOR CERVICAL DECOMPRESSION/DISCECTOMY FUSION 4 LEVELS N/A 08/28/2019   Procedure: ANTERIOR CERVICAL DECOMPRESSION/DISCECTOMY FUSION CERVICALTHREE-FOUR CERVICAL,FOUR-FIVE,CERVICAL FIVE-SIX,CERVICAL SIX-SEVEN.;  Surgeon: [name], MD;  Location: [place];  Service: Neurosurgery;  Laterality: N/A;  ANTERIOR  . CATARACT EXTRACTION W/PHACO Right 03/31/2018   Procedure: CATARACT EXTRACTION PHACO AND INTRAOCULAR LENS PLACEMENT ([place]);  Surgeon: [name], MD;  Location: [facility];  Service: Ophthalmology;  Laterality: Right;  [place] 00:39.7 CDE 4.35 Fluid Pack Lot # [id number]  . CHOLECYSTECTOMY  1995  . COLONOSCOPY WITH PROPOFOL N/A 06/07/2017   Procedure: COLONOSCOPY WITH PROPOFOL;  Surgeon: [name], MD;  Location: [facility] ENDOSCOPY;  Service: Endoscopy;  Laterality: N/A;  . ESOPHAGOGASTRODUODENOSCOPY (EGD) WITH PROPOFOL N/A 06/07/2017   Procedure: ESOPHAGOGASTRODUODENOSCOPY (EGD) WITH PROPOFOL;  Surgeon: [name], MD;  Location: [facility] ENDOSCOPY;  Service: Endoscopy;  Laterality: N/A;  . REDUCTION MAMMAPLASTY Bilateral 1994   Family History  Problem Relation Age of Onset  . Breast cancer Maternal Aunt   . Breast cancer Paternal Aunt   . CAD Father   . Heart attack Father    Social History:  reports that she has never smoked. She has never used smokeless tobacco. She reports that she does not drink alcohol or use drugs. Allergies:  Allergies  Allergen Reactions  . Ace Inhibitors Anaphylaxis   Medications Prior to Admission  Medication Sig Dispense Refill  .  acetaminophen (TYLENOL) 500 MG tablet Take 1,000 mg by mouth daily as needed for moderate pain or headache.    . Glucosamine-Chondroit-Vit C-Mn (GLUCOSAMINE CHONDR 1500 COMPLX PO) Take 1 tablet by mouth 2  (two) times daily.    . hydrochlorothiazide (HYDRODIURIL) 25 MG tablet Take 12.5 mg by mouth daily.     Marland Kitchen omeprazole (PRILOSEC) 40 MG capsule Take 40 mg by mouth at bedtime.      Drug Regimen Review Drug regimen was reviewed and remains appropriate with no significant issues identified  Home: Home Living Family/patient expects to be discharged to:: Private residence Living Arrangements: Spouse/significant other Available Help at Discharge: Available 24 hours/day Type of Home: House Home Access: Stairs to enter [employer] of Steps: 3 @ back, 4 @ front Entrance Stairs-Rails: Can reach both Home Layout: One level Bathroom Shower/Tub: Chiropodist: Handicapped height Home Equipment: Environmental consultant - 2 wheels, Grab bars - tub/shower, Industrial/product designer History: Prior Function Level of Independence: Independent Comments: loved gardening; Retired from Musician Status:  Mobility: Bed Mobility Overal bed mobility: Needs Assistance Bed Mobility: Rolling, Sidelying to Sit Rolling: +2 for physical assistance, Max assist Sidelying to sit: Max assist, +2 for physical assistance General bed mobility comments: pt attempted to move bilat UEs and LEs to assist in transfer but ultimated requiring maxAX2 for LE management and trunk elevation Transfers Overall transfer level: Needs assistance Transfer via Lift Equipment: Stedy Transfers: Sit to/from Stand Sit to Stand: Mod assist, +2 physical assistance General transfer comment: extensor tone noted, pt able power up, assist needed at bilat UEs as pt unable to grip and hold onto bar of stedy, supported pt under bilat axilla and at hips with bed pad Ambulation/Gait General Gait Details: unable at this time    ADL: ADL Overall ADL's : Needs assistance/impaired Eating/Feeding: Total assistance Grooming: Maximal assistance Grooming Details (indicate cue type and reason): able to use  supination and weak tenodesis to bring cloth to mouth Upper Body Bathing: Maximal assistance, Bed level Lower Body Bathing: Total assistance, Bed level Upper Body Dressing : Total assistance, Bed level Lower Body Dressing: Total assistance, Bed level Toilet Transfer: +2 for physical assistance, Maximal assistance(with use of Stedy) Toileting- Clothing Manipulation and Hygiene: Total assistance Toileting - Clothing Manipulation Details (indicate cue type and reason): incontinent B/B Functional mobility during ADLs: +2 for physical assistance, Maximal assistance  Cognition: Cognition Overall Cognitive Status: Within Functional Limits for tasks assessed Orientation Level: Oriented X4 Cognition Arousal/Alertness: Awake/alert Behavior During Therapy: WFL for tasks assessed/performed Overall Cognitive Status: Within Functional Limits for tasks assessed  Physical Exam: Blood pressure 122/62, pulse 82, temperature 98.2 F (36.8 C), temperature source Oral, resp. rate 20, height 5\' 3"  (1.6 m), weight 85.3 kg, SpO2 95 %. Physical Exam  Nursing note and vitals reviewed. Constitutional: She is oriented to person, place, and time. She appears well-developed and well-nourished.  Older female laying supine in bed; daughter and husband at bedside, NAD Not wearing collar since not up.   HENT:  Head: Normocephalic and atraumatic.  Nose: Nose normal.  Mouth/Throat: Oropharynx is clear and moist. No oropharyngeal exudate.  Equal smile; facial sensation intact  Eyes: Conjunctivae are normal. Right eye exhibits no discharge. Left eye exhibits no discharge.  EOMI B/L- no nystagmus  Neck: No tracheal deviation present.  R anterior neck incision looks good- covered with steristrips- no drainage Decreased neck ROM  Cardiovascular:  RRR- no JVD  Respiratory:  CTA B/L-  good air movement B/L  GI:  Soft, NT, ND, (+)BS   Musculoskeletal:     Cervical back: Neck supple.     Comments: RUE- biceps 4/5, WE  3/5, triceps 4/5, grip and finger abd 0/5 LUE- biceps 4/5, WE 3/5, triceps 3/5, grip and finger abd 0/5 Per pt,  LE strength is new/today RLE- HF 2+/5, KE 4-/5, DF 1/5, PF 3/5, EHL 0/5 LLE- HF 2/5, KE 3+/5, DF 0/5, PF 3-/5, EHL 1/5   Neurological: She is alert and oriented to person, place, and time.  Patient is alert in no acute distress.  Makes eye contact with examiner and oriented x3. Pt's entire sensory exam done- checked each dermatome individually- which took 20 minutes by itself C4 intact; C5 intact on R; decreased on L; C6-T1 decreased B/L, but absent on L T2-T12- decreased B/L- worse on L L1-L3 nml on R- decreased on L; L4-S1 R nml; L decreased; S3-S5 decreased B/L Has Hoffman's B/L; no clonus Significant, severe extensor tone with simple ROM- no increased tone at rest  Skin: Skin is warm and dry.  1 IV L hand- looks good- no infiltrate No issues on heels or bottom- skin intact  Psychiatric: She has a normal mood and affect.    No results found for this or any previous visit (from the past 48 hour(s)). DG Cervical Spine 2-3 Views  Result Date: 08/28/2019 CLINICAL DATA:  Provided history: Surgery, elective. Surgery ACDF C3-C7. Provided fluoroscopy time 38 seconds. EXAM: CERVICAL SPINE - 2-3 VIEW; DG C-ARM 1-60 MIN COMPARISON:  Cervical spine MRI 06/26/2019 FINDINGS: Three fluoroscopic images of the cervical spine are submitted. The fluoroscopic images demonstrate sequela of C3-C7 ACDF with ventral fusion hardware and interbody spacers at these levels. Superimposition of the shoulders partially obscures the lower cervical levels on the lateral view images. No unexpected finding. Partially visualized support tubes. IMPRESSION: Intraoperative fluoroscopic images of the cervical spine from C3-C7 ACDF as described. No unexpected finding. Electronically Signed   By: Kellie Simmering DO   On: 08/28/2019 16:37   DG C-Arm 1-60 Min  Result Date: 08/28/2019 CLINICAL DATA:  Provided history:  Surgery, elective. Surgery ACDF C3-C7. Provided fluoroscopy time 38 seconds. EXAM: CERVICAL SPINE - 2-3 VIEW; DG C-ARM 1-60 MIN COMPARISON:  Cervical spine MRI 06/26/2019 FINDINGS: Three fluoroscopic images of the cervical spine are submitted. The fluoroscopic images demonstrate sequela of C3-C7 ACDF with ventral fusion hardware and interbody spacers at these levels. Superimposition of the shoulders partially obscures the lower cervical levels on the lateral view images. No unexpected finding. Partially visualized support tubes. IMPRESSION: Intraoperative fluoroscopic images of the cervical spine from C3-C7 ACDF as described. No unexpected finding. Electronically Signed   By: Kellie Simmering DO   On: 08/28/2019 16:37       Medical Problem List and Plan: 1.  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  Status post ACDF 08/28/2019.  Decadron taper.  Cervical collar as directed.  -need to order PRAFOs to prevent Ankle contractures  -still on Decadron taper  -patient may  shower  -ELOS/Goals: 4 weeks- goals min-mod assist 2.  Antithrombotics: -DVT/anticoagulation: SCDs.  Check vascular study and check with [facility] to see if can start Lovenox  -antiplatelet therapy: N/A 3. Pain Management: Baclofen 5 mg 3 times daily- will likely need to increase, oxycodone as needed 4. Mood: Provide emotional support  -antipsychotic agents: N/A 5. Neuropsych: This patient is capable of making decisions on her own behalf. 6.  Skin/Wound Care: Routine skin checks and turn while in bed every 2 hours while awake- keep on sides at night.  7. Fluids/Electrolytes/Nutrition: Routine in and outs with follow-up chemistries 8.  Neurogenic bowel and bladder.  con't foley until get her bowels cleaned out- can remove foley IF pt wants to d/c, and is able to do on own at home- if not, might need foley long term;.  Start bowel program.  Provide education. 9.  GERD.  Protonix 10. Hx of  prediabetes- check an A1c and monitor as required.  11. Hx of gout- this acute event could be a trigger for gout- will monitor.    Lavon Paganini Angiulli, PA-C 08/30/2019   I have personally performed a face to face diagnostic evaluation of this patient and formulated the key components of the plan.  Additionally, I have personally reviewed laboratory data, imaging studies, as well as relevant notes and concur with the physician assistant's documentation above.   The patient's status has not changed from the original H&P.  Any changes in documentation from the acute care chart have been noted above.

## 2019-08-30 NOTE — Progress Notes (Signed)
Physical Therapy Treatment Patient Details Name: Meredith Roach MRN: [id number] DOB: 23-Aug-1940 Today's Date: 08/30/2019    History of Present Illness  79 yo who fell while gardening in yard and struck her face and suffered a central cord syndrome.  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 Decompressive anterior cervical discectomy C3-4 C4-5 C5-6 C6-7, 2. Anterior cervical arthrodesis C3-4 C4-5 C5-6 C6-7 5/17. She has had a lumbar fusion in 2018     PT Comments    Pt with improved ability to move bilat UEs today and was able to lift both R and L LE off bed for PT to don socks. Pt continues to have spasms but is demonstrating improved active mobility. Worked in conjunction with OT and focused on static and dynamic sitting balance and standing tolerance. Instructed dtr on how to assist pt with LE exercises. Dtr reports patient staying up in chair all afternoon yesterday. Cont to recommend CIR upon d/c. Acute PT to continue to follow.    Follow Up Recommendations  CIR;Supervision/Assistance - 24 hour     Equipment Recommendations  Other (comment)    Recommendations for Other Services       Precautions / Restrictions Precautions Precautions: Fall;Cervical Precaution Booklet Issued: No Required Braces or Orthoses: Cervical Brace Cervical Brace: Hard collar(when up OOB) Restrictions Weight Bearing Restrictions: No    Mobility  Bed Mobility Overal bed mobility: Needs Assistance Bed Mobility: Rolling;Sidelying to Sit Rolling: +2 for physical assistance;Max assist Sidelying to sit: Max assist;+2 for physical assistance       General bed mobility comments: pt attempted to move bilat UEs and LEs to assist in transfer but ultimated requiring maxAX2 for LE management and trunk elevation  Transfers Overall transfer level: Needs assistance   Transfers: Sit to/from Stand Sit to Stand: Mod assist;+2 physical assistance          General transfer comment: extensor tone noted, pt able power up, assist needed at bilat UEs as pt unable to grip and hold onto bar of stedy, supported pt under bilat axilla and at hips with bed pad  Ambulation/Gait             General Gait Details: unable at this time   Stairs             Wheelchair Mobility    Modified Rankin (Stroke Patients Only)       Balance Overall balance assessment: Needs assistance Sitting-balance support: Feet supported Sitting balance-Leahy Scale: Poor Sitting balance - Comments: worked on maintaining static sitting balance, less retropulsion today, pt able to self correct to midline/neutral position with verbal cues. worked on leaning side to side and pushing up with hands, pt unable to maintain balance >5sec without requiring support.    Standing balance support: Bilateral upper extremity supported Standing balance-Leahy Scale: Poor Standing balance comment: pt tolerated standing x 2 min, PT focused on bilat LE support and achieving bilat knee extension while OT focuse on trunk and upper body support. Also completed 5 min sit to stand with modAx2, noted active contraction of glut muscles                            Cognition Arousal/Alertness: Awake/alert Behavior During Therapy: WFL for tasks assessed/performed Overall Cognitive Status: Within Functional Limits for tasks assessed  Exercises General Exercises - Lower Extremity Ankle Circles/Pumps: PROM;Both;10 reps;Seated Long Arc Quad: AROM;Both;10 reps;Seated(about 1/2 ROM) Hip Flexion/Marching: AAROM;Both;10 reps;Seated    General Comments General comments (skin integrity, edema, etc.): daughter present, instructed on AA ROM to bilat LEs      Pertinent Vitals/Pain Pain Assessment: Faces Faces Pain Scale: Hurts little more Pain Location: leg spasms Pain Descriptors / Indicators: Spasm Pain  Intervention(s): Monitored during session(pt on baclofen)    Home Living                      Prior Function            PT Goals (current goals can now be found in the care plan section) Progress towards PT goals: Progressing toward goals    Frequency    Min 4X/week      PT Plan Current plan remains appropriate    Co-evaluation PT/OT/SLP Co-Evaluation/Treatment: Yes Reason for Co-Treatment: Complexity of the patient's impairments (multi-system involvement) PT goals addressed during session: Mobility/safety with mobility        AM-PAC PT "6 Clicks" Mobility   Outcome Measure  Help needed turning from your back to your side while in a flat bed without using bedrails?: Total Help needed moving from lying on your back to sitting on the side of a flat bed without using bedrails?: Total Help needed moving to and from a bed to a chair (including a wheelchair)?: Total Help needed standing up from a chair using your arms (e.g., wheelchair or bedside chair)?: Total Help needed to walk in hospital room?: Total Help needed climbing 3-5 steps with a railing? : Total 6 Click Score: 6    End of Session Equipment Utilized During Treatment: Gait belt;Cervical collar Activity Tolerance: Patient tolerated treatment well Patient left: in chair;with call bell/phone within reach;with family/visitor present Nurse Communication: Mobility status;Need for lift equipment(how to use stedy to transfer pt back to bed) PT Visit Diagnosis: Other abnormalities of gait and mobility (R26.89);Other symptoms and signs involving the nervous system (R29.898);Muscle weakness (generalized) (M62.81)     Time: [id number] PT Time Calculation (min) (ACUTE ONLY): 49 min  Charges:  $Therapeutic Activity: 8-22 mins $Neuromuscular Re-education: 8-22 mins                     Kittie Plater, PT, DPT Acute Rehabilitation Services Pager #: [phone] Office #: [phone]    Berline Lopes 08/30/2019,  10:46 AM

## 2019-08-30 NOTE — Progress Notes (Signed)
SLP Cancellation Note  Patient Details Name: Meredith Roach MRN: [id number] DOB: 06-11-1940   Cancelled treatment:       Reason Eval/Treat Not Completed: Other (comment) Pt currently working with PT and OT. Will f/u as able.    Osie Bond., M.A. [place] Acute Rehabilitation Services Pager [phone] Office [phone]  08/30/2019, 10:03 AM

## 2019-08-30 NOTE — Progress Notes (Signed)
Subjective: Patient reports no pain at all, clonus is getting better in her arms. Thinks that strength is improving in uppers.   Objective: Vital signs in last 24 hours: Temp:  [97.5 F (36.4 C)-98.4 F (36.9 C)] 98.3 F (36.8 C) (05/19 0739) Pulse Rate:  [65-97] 79 (05/19 0620) Resp:  [13-22] 15 (05/19 0739) BP: (94-159)/(45-71) 146/63 (05/19 0739) SpO2:  [94 %-99 %] 96 % (05/18 2100)  Intake/Output from previous day: 05/18 0701 - 05/19 0700 In: 307.7 [P.O.:120; I.V.:187.7] Out: 3000 [Urine:2900; Drains:100] Intake/Output this shift: No intake/output data recorded.  Neurologic: Grossly normal  Lab Results: Lab Results  Component Value Date   WBC 6.9 08/26/2019   HGB 13.7 08/26/2019   HCT 42.6 08/26/2019   MCV 88.0 08/26/2019   PLT 237 08/26/2019   Lab Results  Component Value Date   INR 0.90 10/15/2016   BMET Lab Results  Component Value Date   NA 139 08/26/2019   K 3.9 08/26/2019   CL 104 08/26/2019   CO2 23 08/26/2019   GLUCOSE 123 (H) 08/26/2019   BUN 16 08/26/2019   CREATININE 0.86 08/26/2019   CALCIUM 8.9 08/26/2019    Studies/Results: DG Cervical Spine 2-3 Views  Result Date: 08/28/2019 CLINICAL DATA:  Provided history: Surgery, elective. Surgery ACDF C3-C7. Provided fluoroscopy time 38 seconds. EXAM: CERVICAL SPINE - 2-3 VIEW; DG C-ARM 1-60 MIN COMPARISON:  Cervical spine MRI 06/26/2019 FINDINGS: Three fluoroscopic images of the cervical spine are submitted. The fluoroscopic images demonstrate sequela of C3-C7 ACDF with ventral fusion hardware and interbody spacers at these levels. Superimposition of the shoulders partially obscures the lower cervical levels on the lateral view images. No unexpected finding. Partially visualized support tubes. IMPRESSION: Intraoperative fluoroscopic images of the cervical spine from C3-C7 ACDF as described. No unexpected finding. Electronically Signed   By: Kellie Simmering DO   On: 08/28/2019 16:37   DG C-Arm 1-60  Min  Result Date: 08/28/2019 CLINICAL DATA:  Provided history: Surgery, elective. Surgery ACDF C3-C7. Provided fluoroscopy time 38 seconds. EXAM: CERVICAL SPINE - 2-3 VIEW; DG C-ARM 1-60 MIN COMPARISON:  Cervical spine MRI 06/26/2019 FINDINGS: Three fluoroscopic images of the cervical spine are submitted. The fluoroscopic images demonstrate sequela of C3-C7 ACDF with ventral fusion hardware and interbody spacers at these levels. Superimposition of the shoulders partially obscures the lower cervical levels on the lateral view images. No unexpected finding. Partially visualized support tubes. IMPRESSION: Intraoperative fluoroscopic images of the cervical spine from C3-C7 ACDF as described. No unexpected finding. Electronically Signed   By: Kellie Simmering DO   On: 08/28/2019 16:37    Assessment/Plan: 79 year old female status post acdf for spinal cord injury. Continue therapies today. Planning for CIR.    LOS: 4 days    Ocie Cornfield [facility] 08/30/2019, 8:23 AM

## 2019-08-31 ENCOUNTER — Inpatient Hospital Stay (HOSPITAL_COMMUNITY)
Admission: AD | Admit: 2019-08-31 | Discharge: 2019-09-29 | DRG: 559 | Disposition: A | Discharge status: Home Health | Payer: Medicare PPO | Source: Intra-hospital | Attending: Physical Medicine and Rehabilitation | Admitting: Physical Medicine and Rehabilitation

## 2019-08-31 ENCOUNTER — Encounter (HOSPITAL_COMMUNITY): Payer: Self-pay | Admitting: Physical Medicine and Rehabilitation

## 2019-08-31 ENCOUNTER — Other Ambulatory Visit: Payer: Self-pay

## 2019-08-31 DIAGNOSIS — K219 Gastro-esophageal reflux disease without esophagitis: Secondary | ICD-10-CM | POA: Diagnosis present

## 2019-08-31 DIAGNOSIS — Z8249 Family history of ischemic heart disease and other diseases of the circulatory system: Secondary | ICD-10-CM

## 2019-08-31 DIAGNOSIS — G8252 Quadriplegia, C1-C4 incomplete: Secondary | ICD-10-CM | POA: Diagnosis present

## 2019-08-31 DIAGNOSIS — Z803 Family history of malignant neoplasm of breast: Secondary | ICD-10-CM

## 2019-08-31 DIAGNOSIS — M792 Neuralgia and neuritis, unspecified: Secondary | ICD-10-CM

## 2019-08-31 DIAGNOSIS — B9620 Unspecified Escherichia coli [E. coli] as the cause of diseases classified elsewhere: Secondary | ICD-10-CM | POA: Diagnosis not present

## 2019-08-31 DIAGNOSIS — Z79899 Other long term (current) drug therapy: Secondary | ICD-10-CM

## 2019-08-31 DIAGNOSIS — G629 Polyneuropathy, unspecified: Secondary | ICD-10-CM | POA: Diagnosis present

## 2019-08-31 DIAGNOSIS — N390 Urinary tract infection, site not specified: Secondary | ICD-10-CM | POA: Diagnosis not present

## 2019-08-31 DIAGNOSIS — R110 Nausea: Secondary | ICD-10-CM | POA: Diagnosis not present

## 2019-08-31 DIAGNOSIS — W1830XD Fall on same level, unspecified, subsequent encounter: Secondary | ICD-10-CM | POA: Diagnosis not present

## 2019-08-31 DIAGNOSIS — Z9841 Cataract extraction status, right eye: Secondary | ICD-10-CM | POA: Diagnosis not present

## 2019-08-31 DIAGNOSIS — T380X5A Adverse effect of glucocorticoids and synthetic analogues, initial encounter: Secondary | ICD-10-CM | POA: Diagnosis not present

## 2019-08-31 DIAGNOSIS — Z4789 Encounter for other orthopedic aftercare: Principal | ICD-10-CM

## 2019-08-31 DIAGNOSIS — Z961 Presence of intraocular lens: Secondary | ICD-10-CM | POA: Diagnosis present

## 2019-08-31 DIAGNOSIS — N319 Neuromuscular dysfunction of bladder, unspecified: Secondary | ICD-10-CM | POA: Diagnosis present

## 2019-08-31 DIAGNOSIS — Z9049 Acquired absence of other specified parts of digestive tract: Secondary | ICD-10-CM

## 2019-08-31 DIAGNOSIS — R339 Retention of urine, unspecified: Secondary | ICD-10-CM | POA: Diagnosis present

## 2019-08-31 DIAGNOSIS — R200 Anesthesia of skin: Secondary | ICD-10-CM | POA: Diagnosis not present

## 2019-08-31 DIAGNOSIS — F419 Anxiety disorder, unspecified: Secondary | ICD-10-CM | POA: Diagnosis present

## 2019-08-31 DIAGNOSIS — S14107D Unspecified injury at C7 level of cervical spinal cord, subsequent encounter: Secondary | ICD-10-CM | POA: Diagnosis not present

## 2019-08-31 DIAGNOSIS — Z1629 Resistance to other single specified antibiotic: Secondary | ICD-10-CM | POA: Diagnosis not present

## 2019-08-31 DIAGNOSIS — R7303 Prediabetes: Secondary | ICD-10-CM | POA: Diagnosis present

## 2019-08-31 DIAGNOSIS — G8250 Quadriplegia, unspecified: Secondary | ICD-10-CM | POA: Diagnosis present

## 2019-08-31 DIAGNOSIS — M1990 Unspecified osteoarthritis, unspecified site: Secondary | ICD-10-CM | POA: Diagnosis present

## 2019-08-31 DIAGNOSIS — K592 Neurogenic bowel, not elsewhere classified: Secondary | ICD-10-CM | POA: Diagnosis present

## 2019-08-31 DIAGNOSIS — R739 Hyperglycemia, unspecified: Secondary | ICD-10-CM | POA: Diagnosis not present

## 2019-08-31 DIAGNOSIS — M109 Gout, unspecified: Secondary | ICD-10-CM | POA: Diagnosis present

## 2019-08-31 DIAGNOSIS — Y92239 Unspecified place in hospital as the place of occurrence of the external cause: Secondary | ICD-10-CM | POA: Diagnosis not present

## 2019-08-31 DIAGNOSIS — S14123A 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, initial encounter: Secondary | ICD-10-CM

## 2019-08-31 DIAGNOSIS — R202 Paresthesia of skin: Secondary | ICD-10-CM | POA: Diagnosis not present

## 2019-08-31 DIAGNOSIS — Z981 Arthrodesis status: Secondary | ICD-10-CM | POA: Diagnosis not present

## 2019-08-31 DIAGNOSIS — M7989 Other specified soft tissue disorders: Secondary | ICD-10-CM | POA: Diagnosis not present

## 2019-08-31 DIAGNOSIS — G959 Disease of spinal cord, unspecified: Secondary | ICD-10-CM | POA: Diagnosis present

## 2019-08-31 DIAGNOSIS — D696 Thrombocytopenia, unspecified: Secondary | ICD-10-CM | POA: Diagnosis not present

## 2019-08-31 DIAGNOSIS — Z888 Allergy status to other drugs, medicaments and biological substances status: Secondary | ICD-10-CM

## 2019-08-31 DIAGNOSIS — M4802 Spinal stenosis, cervical region: Secondary | ICD-10-CM | POA: Diagnosis present

## 2019-08-31 DIAGNOSIS — E875 Hyperkalemia: Secondary | ICD-10-CM | POA: Diagnosis not present

## 2019-08-31 HISTORY — DX: Myoneural disorder, unspecified: G70.9

## 2019-08-31 MED ORDER — DEXAMETHASONE 4 MG PO TABS
2.0000 mg | ORAL_TABLET | Freq: Four times a day (QID) | ORAL | Status: DC
  Administered 2019-08-31 (×2): 2 mg via ORAL
  Filled 2019-08-31 (×2): qty 1

## 2019-08-31 MED ORDER — SORBITOL 70 % SOLN: 30.0000 mL | Freq: Every day | Status: DC | PRN

## 2019-08-31 MED ORDER — DEXAMETHASONE SODIUM PHOSPHATE 4 MG/ML IJ SOLN
2.0000 mg | Freq: Four times a day (QID) | INTRAMUSCULAR | Status: AC
  Filled 2019-08-31 (×40): qty 0.5

## 2019-08-31 MED ORDER — BACLOFEN 10 MG PO TABS
10.0000 mg | ORAL_TABLET | Freq: Three times a day (TID) | ORAL | Status: DC
  Administered 2019-08-31 – 2019-09-29 (×86): 10 mg via ORAL
  Filled 2019-08-31 (×86): qty 1

## 2019-08-31 MED ORDER — PANTOPRAZOLE SODIUM 40 MG PO TBEC
40.0000 mg | DELAYED_RELEASE_TABLET | Freq: Every day | ORAL | Status: DC
  Administered 2019-08-31 – 2019-09-29 (×30): 40 mg via ORAL
  Filled 2019-08-31 (×30): qty 1

## 2019-08-31 MED ORDER — OXYCODONE HCL 10 MG PO TABS: 5.0000 mg | ORAL_TABLET | Freq: Four times a day (QID) | ORAL | 0 refills | Status: DC | PRN

## 2019-08-31 MED ORDER — BACLOFEN 10 MG PO TABS
10.0000 mg | ORAL_TABLET | Freq: Three times a day (TID) | ORAL | Status: DC
  Administered 2019-08-31 (×2): 10 mg via ORAL
  Filled 2019-08-31 (×2): qty 1

## 2019-08-31 MED ORDER — DEXAMETHASONE SODIUM PHOSPHATE 4 MG/ML IJ SOLN: 2.0000 mg | Freq: Four times a day (QID) | INTRAMUSCULAR | Status: DC

## 2019-08-31 MED ORDER — DEXAMETHASONE 4 MG PO TABS
2.0000 mg | ORAL_TABLET | Freq: Four times a day (QID) | ORAL | Status: AC
  Administered 2019-09-01 – 2019-09-10 (×41): 2 mg via ORAL
  Filled 2019-08-31 (×41): qty 1

## 2019-08-31 MED ORDER — OXYCODONE HCL 5 MG PO TABS
10.0000 mg | ORAL_TABLET | ORAL | Status: DC | PRN
  Administered 2019-09-01 – 2019-09-22 (×16): 10 mg via ORAL
  Filled 2019-08-31 (×21): qty 2

## 2019-08-31 MED ORDER — ACETAMINOPHEN 650 MG RE SUPP: 650.0000 mg | RECTAL | Status: DC | PRN

## 2019-08-31 MED ORDER — ACETAMINOPHEN 325 MG PO TABS
650.0000 mg | ORAL_TABLET | ORAL | Status: DC | PRN
  Administered 2019-09-01 – 2019-09-14 (×7): 650 mg via ORAL
  Filled 2019-08-31 (×8): qty 2

## 2019-08-31 MED ORDER — ONDANSETRON HCL 4 MG/2ML IJ SOLN: 4.0000 mg | Freq: Four times a day (QID) | INTRAMUSCULAR | Status: DC | PRN

## 2019-08-31 MED ORDER — ONDANSETRON HCL 4 MG PO TABS: 4.0000 mg | ORAL_TABLET | Freq: Four times a day (QID) | ORAL | Status: DC | PRN

## 2019-08-31 MED ORDER — SENNA 8.6 MG PO TABS
1.0000 | ORAL_TABLET | Freq: Two times a day (BID) | ORAL | Status: DC
  Administered 2019-08-31 – 2019-09-03 (×6): 8.6 mg via ORAL
  Filled 2019-08-31 (×6): qty 1

## 2019-08-31 NOTE — Discharge Summary (Signed)
Physician Discharge Summary  Patient ID: Meredith Roach MRN: [id number] DOB/AGE: Sep 08, 1940 79 y.o.  Admit date: 08/26/2019 Discharge date: 08/31/2019  Admission Diagnoses: central cord syndrome, severe cervical stenosis    Discharge Diagnoses: same, acdf   Discharged Condition: stable  Hospital Course: The patient was admitted on 08/26/2019 and taken to the operating room where the patient underwent ACDF C3-4 C4-5 C5-6 C6-7 for SCI suffered in a fall. She was fairly densely quadriparetic. The patient tolerated the procedure well and was taken to the recovery room and then to the ICU in stable condition. The hospital course was routine. There were no complications. The wound remained clean dry and intact. The patient remained afebrile with stable vital signs, and tolerated a regular diet. The patient continued to increase activities, and pain was well controlled with oral pain medications. Her strength improved slightly. She was started on baclofen for spastic episodes.  Consults: rehabilitation medicine  Significant Diagnostic Studies:  Results for orders placed or performed during the hospital encounter of 08/26/19  SARS Coronavirus 2 by RT PCR (hospital order, performed in [facility] hospital lab) Nasopharyngeal Nasopharyngeal Swab   Specimen: Nasopharyngeal Swab  Result Value Ref Range   SARS Coronavirus 2 NEGATIVE NEGATIVE  Surgical PCR screen   Specimen: Nasal Mucosa; Nasal Swab  Result Value Ref Range   MRSA, PCR NEGATIVE NEGATIVE   Staphylococcus aureus NEGATIVE NEGATIVE  CBC with Differential  Result Value Ref Range   WBC 6.9 4.0 - 10.5 K/uL   RBC 4.84 3.87 - 5.11 MIL/uL   Hemoglobin 13.7 12.0 - 15.0 g/dL   HCT 42.6 36.0 - 46.0 %   MCV 88.0 80.0 - 100.0 fL   MCH 28.3 26.0 - 34.0 pg   MCHC 32.2 30.0 - 36.0 g/dL   RDW 12.9 11.5 - 15.5 %   Platelets 237 150 - 400 K/uL   nRBC 0.0 0.0 - 0.2 %   Neutrophils Relative % 71 %   Neutro Abs 4.9 1.7 - 7.7 K/uL   Lymphocytes  Relative 21 %   Lymphs Abs 1.4 0.7 - 4.0 K/uL   Monocytes Relative 6 %   Monocytes Absolute 0.4 0.1 - 1.0 K/uL   Eosinophils Relative 1 %   Eosinophils Absolute 0.1 0.0 - 0.5 K/uL   Basophils Relative 0 %   Basophils Absolute 0.0 0.0 - 0.1 K/uL   Immature Granulocytes 1 %   Abs Immature Granulocytes 0.04 0.00 - 0.07 K/uL  Comprehensive metabolic panel  Result Value Ref Range   Sodium 139 135 - 145 mmol/L   Potassium 3.9 3.5 - 5.1 mmol/L   Chloride 104 98 - 111 mmol/L   CO2 23 22 - 32 mmol/L   Glucose, Bld 123 (H) 70 - 99 mg/dL   BUN 16 8 - 23 mg/dL   Creatinine, Ser 0.86 0.44 - 1.00 mg/dL   Calcium 8.9 8.9 - 10.3 mg/dL   Total Protein 6.3 (L) 6.5 - 8.1 g/dL   Albumin 3.6 3.5 - 5.0 g/dL   AST 30 15 - 41 U/L   ALT 15 0 - 44 U/L   Alkaline Phosphatase 62 38 - 126 U/L   Total Bilirubin 0.9 0.3 - 1.2 mg/dL   GFR calc non Af Amer >60 >60 mL/min   GFR calc Af Amer >60 >60 mL/min   Anion gap 12 5 - 15  CK  Result Value Ref Range   Total CK 93 38 - 234 U/L  Type and screen [place]  Result Value Ref Range   ABO/RH(D) A POS    Antibody Screen NEG    Sample Expiration      08/29/2019,2359 Performed at [facility], [place] [address] [place], [place] [address]     DG Cervical Spine 2-3 Views  Result Date: 08/28/2019 CLINICAL DATA:  Provided history: Surgery, elective. Surgery ACDF C3-C7. Provided fluoroscopy time 38 seconds. EXAM: CERVICAL SPINE - 2-3 VIEW; DG C-ARM 1-60 MIN COMPARISON:  Cervical spine MRI 06/26/2019 FINDINGS: Three fluoroscopic images of the cervical spine are submitted. The fluoroscopic images demonstrate sequela of C3-C7 ACDF with ventral fusion hardware and interbody spacers at these levels. Superimposition of the shoulders partially obscures the lower cervical levels on the lateral view images. No unexpected finding. Partially visualized support tubes. IMPRESSION: Intraoperative fluoroscopic images of the cervical spine from C3-C7 ACDF as  described. No unexpected finding. Electronically Signed   By: Kellie Simmering DO   On: 08/28/2019 16:37   CT Head Wo Contrast  Result Date: 08/26/2019 CLINICAL DATA:  Head trauma. Headache. History also reports numbness to the left hand which is new and difficulty grabbing with her hands. EXAM: CT HEAD WITHOUT CONTRAST CT CERVICAL SPINE WITHOUT CONTRAST TECHNIQUE: Multidetector CT imaging of the head and cervical spine was performed following the standard protocol without intravenous contrast. Multiplanar CT image reconstructions of the cervical spine were also generated. COMPARISON:  None. FINDINGS: CT HEAD FINDINGS Brain: No evidence of acute infarction, hemorrhage, hydrocephalus, extra-axial collection or mass lesion/mass effect. Vascular: No hyperdense vessel or unexpected calcification. Skull: Normal. Negative for fracture or focal lesion. Sinuses/Orbits: Status post right cataract surgery. Globes and orbits are otherwise unremarkable. Minor ethmoid, right frontal and inferior right maxillary sinus mucosal thickening. Clear mastoid air cells. Other: None. CT CERVICAL SPINE FINDINGS Alignment: Normal. Skull base and vertebrae: No acute fracture. No primary bone lesion or focal pathologic process. Soft tissues and spinal canal: No prevertebral fluid or swelling. No visible canal hematoma. Disc levels: There are significant degenerative changes. Mild to moderate loss of disc height at C2-C3, moderate loss of disc height at C3-C4, mild loss of disc height at C5-C6 and C6-C7. There are endplate osteophytes throughout the cervical spine. There are varying degrees of spondylotic disc bulging, greatest at C3-C4 with disc bulging leading to narrowing of the spinal canal, AP diameter just under 7 mm. No convincing disc herniation. Upper chest: No acute findings.  Clear lung apices. Other: None. IMPRESSION: HEAD CT 1. No intracranial abnormality. CERVICAL CT 1. No fracture or acute finding. 2. Advanced degenerative  changes. Moderate central spinal stenosis due to spondylotic disc bulging at C3-C4. Electronically Signed   By: Lajean Manes M.D.   On: 08/26/2019 18:26   CT Cervical Spine Wo Contrast  Result Date: 08/26/2019 CLINICAL DATA:  Head trauma. Headache. History also reports numbness to the left hand which is new and difficulty grabbing with her hands. EXAM: CT HEAD WITHOUT CONTRAST CT CERVICAL SPINE WITHOUT CONTRAST TECHNIQUE: Multidetector CT imaging of the head and cervical spine was performed following the standard protocol without intravenous contrast. Multiplanar CT image reconstructions of the cervical spine were also generated. COMPARISON:  None. FINDINGS: CT HEAD FINDINGS Brain: No evidence of acute infarction, hemorrhage, hydrocephalus, extra-axial collection or mass lesion/mass effect. Vascular: No hyperdense vessel or unexpected calcification. Skull: Normal. Negative for fracture or focal lesion. Sinuses/Orbits: Status post right cataract surgery. Globes and orbits are otherwise unremarkable. Minor ethmoid, right frontal and inferior right maxillary sinus mucosal thickening. Clear mastoid air  cells. Other: None. CT CERVICAL SPINE FINDINGS Alignment: Normal. Skull base and vertebrae: No acute fracture. No primary bone lesion or focal pathologic process. Soft tissues and spinal canal: No prevertebral fluid or swelling. No visible canal hematoma. Disc levels: There are significant degenerative changes. Mild to moderate loss of disc height at C2-C3, moderate loss of disc height at C3-C4, mild loss of disc height at C5-C6 and C6-C7. There are endplate osteophytes throughout the cervical spine. There are varying degrees of spondylotic disc bulging, greatest at C3-C4 with disc bulging leading to narrowing of the spinal canal, AP diameter just under 7 mm. No convincing disc herniation. Upper chest: No acute findings.  Clear lung apices. Other: None. IMPRESSION: HEAD CT 1. No intracranial abnormality. CERVICAL CT  1. No fracture or acute finding. 2. Advanced degenerative changes. Moderate central spinal stenosis due to spondylotic disc bulging at C3-C4. Electronically Signed   By: Lajean Manes M.D.   On: 08/26/2019 18:26   MR BRAIN WO CONTRAST  Result Date: 08/26/2019 CLINICAL DATA:  Upper and lower extremity weakness. Concern for cord compression. EXAM: MRI HEAD WITHOUT CONTRAST MRI CERVICAL SPINE WITHOUT CONTRAST TECHNIQUE: Multiplanar, multiecho pulse sequences of the brain and surrounding structures, and cervical spine, to include the craniocervical junction and cervicothoracic junction, were obtained without intravenous contrast. COMPARISON:  Head and cervical spine CTs 08/26/2019 FINDINGS: MRI HEAD FINDINGS Brain: There is no evidence of acute infarct, intracranial hemorrhage, mass, midline shift, or extra-axial fluid collection. The ventricles and sulci are normal. Scattered T2 hyperintensities in the predominantly subcortical cerebral white matter bilaterally are nonspecific but compatible with mild chronic small vessel ischemic disease. Mild cerebral atrophy is within normal limits for age. Vascular: Major intracranial vascular flow voids are preserved. Skull and upper cervical spine: Unremarkable bone marrow signal. Sinuses/Orbits: Right cataract extraction. Paranasal sinuses and mastoid air cells are clear. Other: None. MRI CERVICAL SPINE FINDINGS Alignment: Minimal retrolisthesis of C3 on C4 and minimal anterolisthesis of C4 on C5. Vertebrae: No fracture, suspicious osseous lesion, or significant marrow edema. Cord: Multifocal T2 hyperintensity in the spinal cord from C3-4 to C6-7 associated with spinal stenosis at these levels with the signal abnormality being greatest at C3-4 and compatible with myelomalacia. Posterior Fossa, vertebral arteries, paraspinal tissues: Unremarkable. Disc levels: The cervical spinal canal is small in caliber diffusely on a congenital basis. C2-3: Disc bulging, a right  paracentral disc osteophyte complex, and mild right facet arthrosis result in mild spinal stenosis without significant neural foraminal stenosis. C3-4: Disc bulging, a broad posterior disc protrusion, and uncovertebral spurring result in severe spinal stenosis with moderate to severe cord compression and severe left greater than right neural foraminal stenosis. C4-5: A broad right central disc protrusion, disc bulging, uncovertebral spurring, and moderate right facet arthrosis result in severe spinal stenosis with moderate cord compression and severe right neural foraminal stenosis. C5-6: Disc bulging, infolding of the ligamentum flavum, and uncovertebral spurring result in severe spinal stenosis with severe cord compression and severe right and moderate left neural foraminal stenosis. C6-7: Disc bulging, infolding of the ligamentum flavum, and uncovertebral spurring result in severe spinal stenosis with mild cord compression and severe bilateral neural foraminal stenosis. C7-T1: Minimal disc bulging and moderate right and mild left facet arthrosis without stenosis. T1-2: Mild facet arthrosis without disc herniation or stenosis. IMPRESSION: MRI HEAD: 1. No acute intracranial abnormality. 2. Mild chronic small vessel ischemic disease. MRI CERVICAL SPINE: 1. 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 2. Severe spinal stenosis from C3-4 to  C6-7 with up to severe cord compression and multifocal myelomalacia. These results were called by telephone at the time of interpretation on 08/26/2019 at 9:00 pm to Dr. Madalyn Rob, who verbally acknowledged these results. Electronically Signed   By: Logan Bores M.D.   On: 08/26/2019 21:10   MR Cervical Spine Wo Contrast  Result Date: 08/26/2019 CLINICAL DATA:  Upper and lower extremity weakness. Concern for cord compression. EXAM: MRI HEAD WITHOUT CONTRAST MRI CERVICAL SPINE WITHOUT CONTRAST TECHNIQUE: Multiplanar, multiecho pulse sequences of the brain  and surrounding structures, and cervical spine, to include the craniocervical junction and cervicothoracic junction, were obtained without intravenous contrast. COMPARISON:  Head and cervical spine CTs 08/26/2019 FINDINGS: MRI HEAD FINDINGS Brain: There is no evidence of acute infarct, intracranial hemorrhage, mass, midline shift, or extra-axial fluid collection. The ventricles and sulci are normal. Scattered T2 hyperintensities in the predominantly subcortical cerebral white matter bilaterally are nonspecific but compatible with mild chronic small vessel ischemic disease. Mild cerebral atrophy is within normal limits for age. Vascular: Major intracranial vascular flow voids are preserved. Skull and upper cervical spine: Unremarkable bone marrow signal. Sinuses/Orbits: Right cataract extraction. Paranasal sinuses and mastoid air cells are clear. Other: None. MRI CERVICAL SPINE FINDINGS Alignment: Minimal retrolisthesis of C3 on C4 and minimal anterolisthesis of C4 on C5. Vertebrae: No fracture, suspicious osseous lesion, or significant marrow edema. Cord: Multifocal T2 hyperintensity in the spinal cord from C3-4 to C6-7 associated with spinal stenosis at these levels with the signal abnormality being greatest at C3-4 and compatible with myelomalacia. Posterior Fossa, vertebral arteries, paraspinal tissues: Unremarkable. Disc levels: The cervical spinal canal is small in caliber diffusely on a congenital basis. C2-3: Disc bulging, a right paracentral disc osteophyte complex, and mild right facet arthrosis result in mild spinal stenosis without significant neural foraminal stenosis. C3-4: Disc bulging, a broad posterior disc protrusion, and uncovertebral spurring result in severe spinal stenosis with moderate to severe cord compression and severe left greater than right neural foraminal stenosis. C4-5: A broad right central disc protrusion, disc bulging, uncovertebral spurring, and moderate right facet arthrosis  result in severe spinal stenosis with moderate cord compression and severe right neural foraminal stenosis. C5-6: Disc bulging, infolding of the ligamentum flavum, and uncovertebral spurring result in severe spinal stenosis with severe cord compression and severe right and moderate left neural foraminal stenosis. C6-7: Disc bulging, infolding of the ligamentum flavum, and uncovertebral spurring result in severe spinal stenosis with mild cord compression and severe bilateral neural foraminal stenosis. C7-T1: Minimal disc bulging and moderate right and mild left facet arthrosis without stenosis. T1-2: Mild facet arthrosis without disc herniation or stenosis. IMPRESSION: MRI HEAD: 1. No acute intracranial abnormality. 2. Mild chronic small vessel ischemic disease. MRI CERVICAL SPINE: 1. 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 2. Severe spinal stenosis from C3-4 to C6-7 with up to severe cord compression and multifocal myelomalacia. These results were called by telephone at the time of interpretation on 08/26/2019 at 9:00 pm to Dr. Madalyn Rob, who verbally acknowledged these results. Electronically Signed   By: Logan Bores M.D.   On: 08/26/2019 21:10   DG Chest Portable 1 View  Result Date: 08/26/2019 CLINICAL DATA:  Fall.  Weakness. EXAM: PORTABLE CHEST 1 VIEW COMPARISON:  10/15/2016 FINDINGS: Cardiac silhouette is normal in size. No mediastinal hilar masses. No evidence of adenopathy. Hyperexpanded but clear lungs. No convincing pleural effusion and no pneumothorax. Skeletal structures are grossly intact. IMPRESSION: No acute cardiopulmonary disease. Electronically Signed  By: Lajean Manes M.D.   On: 08/26/2019 18:00   DG C-Arm 1-60 Min  Result Date: 08/28/2019 CLINICAL DATA:  Provided history: Surgery, elective. Surgery ACDF C3-C7. Provided fluoroscopy time 38 seconds. EXAM: CERVICAL SPINE - 2-3 VIEW; DG C-ARM 1-60 MIN COMPARISON:  Cervical spine MRI 06/26/2019 FINDINGS: Three  fluoroscopic images of the cervical spine are submitted. The fluoroscopic images demonstrate sequela of C3-C7 ACDF with ventral fusion hardware and interbody spacers at these levels. Superimposition of the shoulders partially obscures the lower cervical levels on the lateral view images. No unexpected finding. Partially visualized support tubes. IMPRESSION: Intraoperative fluoroscopic images of the cervical spine from C3-C7 ACDF as described. No unexpected finding. Electronically Signed   By: Kellie Simmering DO   On: 08/28/2019 16:37    Antibiotics:  Anti-infectives (From admission, onward)   Start     Dose/Rate Route Frequency Ordered Stop   08/28/19 2000  ceFAZolin (ANCEF) IVPB 2g/100 mL premix     2 g 200 mL/hr over 30 Minutes Intravenous Every 8 hours 08/28/19 1848 08/29/19 0546   08/28/19 1236  bacitracin 50,000 Units in sodium chloride 0.9 % 500 mL irrigation  Status:  Discontinued       As needed 08/28/19 1237 08/28/19 1629      Discharge Exam: Blood pressure (!) 136/52, pulse 82, temperature (!) 96.5 F (35.8 C), temperature source Axillary, resp. rate 16, height 5\' 3"  (1.6 m), weight 85.3 kg, SpO2 96 %.  Dressing dry Biceps 4/5, delts 4/5, triceps 2-3/5, thumb opposition 2/5 R, wiggle L thumb slightly, some R wrist extension, no grip, HF 2/5, KE 3/5  Discharge Medications:   Allergies as of 08/31/2019      Reactions   Ace Inhibitors Anaphylaxis      Medication List    TAKE these medications   acetaminophen 500 MG tablet Commonly known as: TYLENOL Take 1,000 mg by mouth daily as needed for moderate pain or headache.   GLUCOSAMINE CHONDR 1500 COMPLX PO Take 1 tablet by mouth 2 (two) times daily.   hydrochlorothiazide 25 MG tablet Commonly known as: HYDRODIURIL Take 12.5 mg by mouth daily.   omeprazole 40 MG capsule Commonly known as: PRILOSEC Take 40 mg by mouth at bedtime.   Oxycodone HCl 10 MG Tabs Take 0.5 tablets (5 mg total) by mouth every 6 (six) hours as needed  for severe pain ((score 7 to 10)).       Disposition: CIR  Final Dx: ACDF, central cord syndrome, spasticity  Discharge Instructions    Call MD for:  redness, tenderness, or signs of infection (pain, swelling, redness, odor or green/yellow discharge around incision site)   Complete by: As directed    Call MD for:  severe uncontrolled pain   Complete by: As directed    Call MD for:  temperature >100.4   Complete by: As directed    Diet - low sodium heart healthy   Complete by: As directed    Increase activity slowly   Complete by: As directed       Follow-up Information    [name], MD. Schedule an appointment as soon as possible for a visit in 2 week(s).   Specialty: Neurosurgery Why: see me 2 wks after discharge from rehab Contact information: [address] [place] [address] [place] [address] [phone]            Signed: [name] 08/31/2019, 8:11 AM

## 2019-08-31 NOTE — Progress Notes (Signed)
Physical Therapy Treatment Patient Details Name: Meredith Roach MRN: [id number] DOB: May 04, 1940 Today's Date: 08/31/2019    History of Present Illness  79 yo who fell while gardening in yard and struck her face and suffered a central cord syndrome.  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 Decompressive anterior cervical discectomy C3-4 C4-5 C5-6 C6-7, 2. Anterior cervical arthrodesis C3-4 C4-5 C5-6 C6-7 5/17. She has had a lumbar fusion in 2018     PT Comments    Pt demonstrated gradual progress.  Session focused on sitting and standing balance with weight shifting and pre-gait activities.  Required assist of 2, cues for sequencing/transfer techniques, and facilitation for posture and transfers.  Pt very motivated and willing to work with therapy - good rehab potential.  Cont POC.    Follow Up Recommendations  CIR;Supervision/Assistance - 24 hour     Equipment Recommendations  Other (comment)(to be determined next venue - pt going to CIR today)    Recommendations for Other Services       Precautions / Restrictions Precautions Precautions: Fall;Cervical Required Braces or Orthoses: Cervical Brace Cervical Brace: Hard collar    Mobility  Bed Mobility Overal bed mobility: Needs Assistance Bed Mobility: Rolling;Sidelying to Sit;Sit to Sidelying Rolling: +2 for physical assistance;Max assist Sidelying to sit: Max assist;+2 for physical assistance     Sit to sidelying: Max assist;+2 for physical assistance General bed mobility comments: pt attempted to move bilat UEs and LEs to assist in transfer but ultimated requiring maxAX2 for LE management and trunk elevation-facilitated with knee flexion and with bed pad  Transfers Overall transfer level: Needs assistance Equipment used: 2 person hand held assist Transfers: Sit to/from Stand Sit to Stand: Mod assist;+2 physical assistance         General transfer comment:  Performed sit to stand x 2 -once with Stedy and once with therapist HHA/blocking knees: extensor tone noted, pt able power up, assist needed at [place] as pt unable to grip and hold onto bar of stedy, supported pt under bilat axilla and at hips with bed pad  Ambulation/Gait Ambulation/Gait assistance: Max assist;+2 physical assistance Gait Distance (Feet): 1 Feet Assistive device: 2 person hand held assist Gait Pattern/deviations: Shuffle Gait velocity: decreased   General Gait Details: Pt took 4 small steps at EOB with max A for weight shift and to advance legs; on 4th step L LE with flexor spasm and had to return to sitting   Stairs             Wheelchair Mobility    Modified Rankin (Stroke Patients Only)       Balance Overall balance assessment: Needs assistance Sitting-balance support: Feet supported;Bilateral upper extremity supported;No upper extremity supported Sitting balance-Leahy Scale: Poor Sitting balance - Comments: Worked on maintaining static sitting balance, no retropulsion today once sitting, pt able to self correct to midline/neutral position with verbal cues. worked on leaning side to side and pushing up with hands, pt able to maintain balance ~20secs without support.  At EOB for ~15 mins total   Standing balance support: Bilateral upper extremity supported Standing balance-Leahy Scale: Poor Standing balance comment: Pt tolerated standing ~2 mins x 2.  Once in [place] and once with therapist support.  Worked on weight shifting and small steps.  Therapist provided blocks at knees as needed and verbal cues to "tuck buttock under" and bring up shoulders. Pt able to activate glut muscles. Facilitated  with bed pad and at shoudlers as needed.                            Cognition Arousal/Alertness: Awake/alert Behavior During Therapy: WFL for tasks assessed/performed Overall Cognitive Status: Within Functional Limits for tasks assessed                                         Exercises Other Exercises Other Exercises: Bil PF stretch 30 sec hold; heel slides with min A x 3 both sides    General Comments        Pertinent Vitals/Pain Pain Assessment: Faces Faces Pain Scale: Hurts a little bit Pain Location: mild pain in back with sit to supine Pain Descriptors / Indicators: Discomfort Pain Intervention(s): Limited activity within patient's tolerance    Home Living                      Prior Function            PT Goals (current goals can now be found in the care plan section) Progress towards PT goals: Progressing toward goals    Frequency    Min 4X/week      PT Plan Current plan remains appropriate    Co-evaluation              AM-PAC PT "6 Clicks" Mobility   Outcome Measure  Help needed turning from your back to your side while in a flat bed without using bedrails?: Total Help needed moving from lying on your back to sitting on the side of a flat bed without using bedrails?: Total Help needed moving to and from a bed to a chair (including a wheelchair)?: Total Help needed standing up from a chair using your arms (e.g., wheelchair or bedside chair)?: A Lot Help needed to walk in hospital room?: Total Help needed climbing 3-5 steps with a railing? : Total 6 Click Score: 7    End of Session Equipment Utilized During Treatment: Gait belt;Cervical collar Activity Tolerance: Patient tolerated treatment well Patient left: with call bell/phone within reach;with family/visitor present;in bed;with bed alarm set(pt to d/c to CIR soon so returned to bed) Nurse Communication: Mobility status;Need for lift equipment PT Visit Diagnosis: Other abnormalities of gait and mobility (R26.89);Other symptoms and signs involving the nervous system (R29.898);Muscle weakness (generalized) (M62.81)     Time: [id number] PT Time Calculation (min) (ACUTE ONLY): 32 min  Charges:  $Therapeutic Activity:  8-22 mins $Neuromuscular Re-education: 8-22 mins                     Maggie Font, PT Acute Rehab Services Pager [phone] [facility] [phone] [facility] [place] 08/31/2019, 4:06 PM

## 2019-08-31 NOTE — Progress Notes (Signed)
Patient ID: Meredith Roach, female   DOB: 1940-07-13, 79 y.o.   MRN: [id number] Subjective: Patient reports continued neuro improvement and no pain  Objective: Vital signs in last 24 hours: Temp:  [97.7 F (36.5 C)-98.6 F (37 C)] 98.1 F (36.7 C) (05/20 0352) Pulse Rate:  [61-82] 61 (05/20 0352) Resp:  [16-20] 16 (05/20 0352) BP: (118-164)/(55-98) 164/63 (05/20 0352) SpO2:  [95 %-97 %] 97 % (05/20 0352)  Intake/Output from previous day: 05/19 0701 - 05/20 0700 In: 1329 [P.O.:1329] Out: 3525 [Urine:3525] Intake/Output this shift: No intake/output data recorded.  B biceps 4/5, triceps3/5, increased HF 2/5, thumb opposition increased on R  Lab Results: Lab Results  Component Value Date   WBC 6.9 08/26/2019   HGB 13.7 08/26/2019   HCT 42.6 08/26/2019   MCV 88.0 08/26/2019   PLT 237 08/26/2019   Lab Results  Component Value Date   INR 0.90 10/15/2016   BMET Lab Results  Component Value Date   NA 139 08/26/2019   K 3.9 08/26/2019   CL 104 08/26/2019   CO2 23 08/26/2019   GLUCOSE 123 (H) 08/26/2019   BUN 16 08/26/2019   CREATININE 0.86 08/26/2019   CALCIUM 8.9 08/26/2019    Studies/Results: No results found.  Assessment/Plan: Doing well, to rehab soon  Estimated body mass index is 33.3 kg/m as calculated from the following:   Height as of this encounter: 5\' 3"  (1.6 m).   Weight as of this encounter: 85.3 kg.    LOS: 5 days    [name] 08/31/2019, 8:05 AM

## 2019-08-31 NOTE — Progress Notes (Signed)
Physical Medicine and Rehabilitation Consult Reason for Consult: Quadriparesis Referring Physician: Dr. Sherley Bounds     HPI: Meredith Roach is a 79 y.o. right-handed female with history of gout as well as prediabetes.  Per chart review patient lives with spouse in [place].  Independent prior to admission.  1 level home.  Husband can assist.  Presented 08/26/2019 after ground-level fall with resultant quadriparesis.  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  CT/MRI of the brain no acute intracranial abnormality.  Admission chemistries unremarkable except glucose 123, CK 93.  Patient underwent decompressive anterior cervical discectomy C3-4, 4-5, C5-6, 6-7 with anterior cervical arthrodesis 08/28/2019 per Dr. Sherley Bounds.  Decadron protocol as indicated.  Aspen cervical collar applied as directed.  Physical occupational therapy evaluations pending.  MD has requested physical medicine rehab consult.     Review of Systems  Constitutional: Negative for chills and fever.  HENT: Negative for hearing loss.   Eyes: Negative for blurred vision and double vision.  Respiratory: Negative for shortness of breath.   Cardiovascular: Negative for chest pain, palpitations and leg swelling.  Gastrointestinal: Positive for constipation. Negative for heartburn, nausea and vomiting.       GERD  Genitourinary: Negative for dysuria, flank pain and hematuria.  Skin: Negative for rash.  Neurological: Positive for sensory change and weakness.  All other systems reviewed and are negative.       Past Medical History:  Diagnosis Date  . Arthritis    . GERD (gastroesophageal reflux disease)    . Gout    . Pre-diabetes           Past Surgical History:  Procedure Laterality Date  . CATARACT EXTRACTION W/PHACO Right 03/31/2018    Procedure: CATARACT EXTRACTION PHACO AND INTRAOCULAR LENS PLACEMENT  ([place]);  Surgeon: [name], MD;  Location: [facility] ORS;  Service: Ophthalmology;  Laterality: Right;  [place] 00:39.7 CDE 4.35 Fluid Pack Lot # [id number]  . CHOLECYSTECTOMY   1995  . COLONOSCOPY WITH PROPOFOL N/A 06/07/2017    Procedure: COLONOSCOPY WITH PROPOFOL;  Surgeon: [name], MD;  Location: [facility] ENDOSCOPY;  Service: Endoscopy;  Laterality: N/A;  . ESOPHAGOGASTRODUODENOSCOPY (EGD) WITH PROPOFOL N/A 06/07/2017    Procedure: ESOPHAGOGASTRODUODENOSCOPY (EGD) WITH PROPOFOL;  Surgeon: [name], MD;  Location: [facility] ENDOSCOPY;  Service: Endoscopy;  Laterality: N/A;  . REDUCTION MAMMAPLASTY Bilateral 1994         Family History  Problem Relation Age of Onset  . Breast cancer Maternal Aunt    . Breast cancer Paternal Aunt    . CAD Father    . Heart attack Father      Social History:  reports that she has never smoked. She has never used smokeless tobacco. She reports that she does not drink alcohol or use drugs. Allergies:      Allergies  Allergen Reactions  . Ace Inhibitors Anaphylaxis          Medications Prior to Admission  Medication Sig Dispense Refill  . acetaminophen (TYLENOL) 500 MG tablet Take 1,000 mg by mouth daily as needed for moderate pain or headache.      . Glucosamine-Chondroit-Vit C-Mn (GLUCOSAMINE CHONDR 1500 COMPLX PO) Take 1 tablet by mouth 2 (two) times daily.      . hydrochlorothiazide (HYDRODIURIL) 25 MG tablet Take 12.5 mg by mouth daily.       Marland Kitchen  omeprazole (PRILOSEC) 40 MG capsule Take 40 mg by mouth at bedtime.          Home:    Functional History: Functional Status:  Mobility:   ADL:   Cognition: Cognition Orientation Level: Oriented X4   Blood pressure (!) 98/50, pulse 71, temperature 98.8 F (37.1 C), temperature source Oral, resp. rate 13, height 5\' 3"  (1.6 m), weight 85.3 kg, SpO2 99 %. Physical Exam  Constitutional: No distress.  Slumped in chair  HENT:  ACDF incision site clean and dry with drain in place  Eyes: Pupils  are equal, round, and reactive to light. EOM are normal.  Cardiovascular: Normal rate.  Respiratory: Effort normal.  GI: Soft.  Neurological:  Patient is alert in no acute distress.  Oriented x3. RUE: 3+ biceps, deltoid, 1-2 triceps, 1 wrist and tr-1 finger flexors. LUE: 3+ deltoid, biceps, trace triceps, 0 wrist and fingers. BLE grossly 1+ HE, KE and tr-1 ADF/PF. Decreased LT in all 4 limbs. DTR's 3+ LE's, frequent extensor spasms noted.   Skin: Skin is warm.  Psychiatric: She has a normal mood and affect. Her behavior is normal.      Lab Results Last 24 Hours  No results found for this or any previous visit (from the past 24 hour(s)).    Imaging Results (Last 48 hours)  DG Cervical Spine 2-3 Views   Result Date: 08/28/2019 CLINICAL DATA:  Provided history: Surgery, elective. Surgery ACDF C3-C7. Provided fluoroscopy time 38 seconds. EXAM: CERVICAL SPINE - 2-3 VIEW; DG C-ARM 1-60 MIN COMPARISON:  Cervical spine MRI 06/26/2019 FINDINGS: Three fluoroscopic images of the cervical spine are submitted. The fluoroscopic images demonstrate sequela of C3-C7 ACDF with ventral fusion hardware and interbody spacers at these levels. Superimposition of the shoulders partially obscures the lower cervical levels on the lateral view images. No unexpected finding. Partially visualized support tubes. IMPRESSION: Intraoperative fluoroscopic images of the cervical spine from C3-C7 ACDF as described. No unexpected finding. Electronically Signed   By: Kellie Simmering DO   On: 08/28/2019 16:37    DG C-Arm 1-60 Min   Result Date: 08/28/2019 CLINICAL DATA:  Provided history: Surgery, elective. Surgery ACDF C3-C7. Provided fluoroscopy time 38 seconds. EXAM: CERVICAL SPINE - 2-3 VIEW; DG C-ARM 1-60 MIN COMPARISON:  Cervical spine MRI 06/26/2019 FINDINGS: Three fluoroscopic images of the cervical spine are submitted. The fluoroscopic images demonstrate sequela of C3-C7 ACDF with ventral fusion hardware and interbody spacers  at these levels. Superimposition of the shoulders partially obscures the lower cervical levels on the lateral view images. No unexpected finding. Partially visualized support tubes. IMPRESSION: Intraoperative fluoroscopic images of the cervical spine from C3-C7 ACDF as described. No unexpected finding. 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 Pt s/p ACDF 08/28/19 1. Does the need for close, 24 hr/day medical supervision in concert with the patient's rehab needs make it unreasonable for this patient to be served in a less intensive setting? Yes 2. Co-Morbidities requiring supervision/potential complications: DM, gout, neurogenic bowel and bladder.  3. Due to bladder management, bowel management, safety, skin/wound care, disease management, medication administration, pain management and patient education, does the patient require 24 hr/day rehab nursing? Yes 4. Does the patient require coordinated care of a physician, rehab nurse, therapy disciplines of PT, OT to address physical and functional deficits in the context of the  above medical diagnosis(es)? Yes Addressing deficits in the following areas: balance, endurance, locomotion, strength, transferring, bowel/bladder control, bathing, dressing, feeding, grooming, toileting and psychosocial support 5. Can the patient actively participate in an intensive therapy program of at least 3 hrs of therapy per day at least 5 days per week? Yes 6. The potential for patient to make measurable gains while on inpatient rehab is excellent 7. Anticipated functional outcomes upon discharge from inpatient rehab are min assist and mod assist  with PT, min assist, mod assist and max assist with OT, n/a with SLP. 8. Estimated rehab length of stay to reach the above functional goals is: 28 days 9. Anticipated discharge destination:  Home 10. Overall Rehab/Functional Prognosis: excellent   RECOMMENDATIONS: This patient's condition is appropriate for continued rehabilitative care in the following setting: CIR Patient has agreed to participate in recommended program. Yes Note that insurance prior authorization may be required for reimbursement for recommended care.   Comment: Rehab Admissions Coordinator to follow up.   Thanks,   Meredith Staggers, MD, Mellody Drown   I have personally performed a face to face diagnostic evaluation of this patient. Additionally, I have examined pertinent labs and radiographic images. I have reviewed and concur with the physician assistant's documentation above.     Lavon Paganini Angiulli, PA-C 08/29/2019

## 2019-08-31 NOTE — Progress Notes (Signed)
PMR Admission Coordinator Pre-Admission Assessment   Patient: Meredith Roach is an 79 y.o., female MRN: [id number] DOB: 1940/08/31 Height: _0  (160 cm) Weight: 85.3 kg                                                                                                                                                  Insurance Information HMO:     PPO: yes     PCP:      IPA:      80/20:      OTHER:  PRIMARY: [employer]      Policy#: [id number]      Subscriber: pt CM Name: Lattie Haw      Phone#: [phone] ext [phone]     Fax#:  [phone] Pre-Cert#: [id number] [place] for CIR provided by Lattie Haw at [facility] with updates due to Lestine Box on 5/26 to fax listed above Jeanette Caprice phone [phone] ext [phone]      Employer:  Benefits:  Phone #: [phone]     Name:  Eff. Date: 04/14/19     Deduct: $0      Out of Pocket Max: $4000 (met $200)      Life Max: n/a  CIR: $160/day for days 1-10      SNF: 20 full days Outpatient:      Co-Pay: $20/visit Home Health: 100%      Co-Pay:  DME: 80%     Co-Pay: 20% Providers:  SECONDARY:       Policy#:       Phone#:    Development worker, community:       Phone#:    The "Data Collection Information Summary" for patients in Inpatient Rehabilitation Facilities with attached "Privacy Act [place] Records" was provided and verbally reviewed with: Patient and Family   Emergency Contact Information         Contact Information     Name Relation Home Work [place] H Spouse [phone]        Haynes Bast Daughter [phone]        Othelia Pulling Daughter     [phone]       Current Medical History  Patient Admitting Diagnosis: central cord syndrome   History of Present Illness:  Meredith Roach is a 79 y.o. right-handed female with history of gout as well as prediabetes.  Presented 08/26/2019 after ground-level fall with resultant quadriparesis.  MRI C-spine showed severe spondylosis and severe spinal stenosis with cord  compression from C3-C7 with signal change in spinal cord.  CT/MRI of the brain no acute intracranial abnormality.  Admission chemistries unremarkable except glucose 123, CK 93.  Patient underwent decompressive anterior cervical discectomy C3-4, 4-5, C5-6, 6-7 with anterior cervical arthrodesis 08/28/2019 per Dr. Sherley Bounds.  Decadron protocol as indicated.  Aspen cervical collar applied as directed.  Physical occupational therapy evaluations  completed and recommendations were made for CIR due to functional decline due to spinal cord injury.    Past Medical History      Past Medical History:  Diagnosis Date  . Arthritis    . GERD (gastroesophageal reflux disease)    . Gout    . Pre-diabetes        Family History  family history includes Breast cancer in her maternal aunt and paternal aunt; CAD in her father; Heart attack in her father.   Prior Rehab/Hospitalizations:  Has the patient had prior rehab or hospitalizations prior to admission? No   Has the patient had major surgery during 100 days prior to admission? Yes   Current Medications    Current Facility-Administered Medications:  .  0.9 %  sodium chloride infusion, 250 mL, Intravenous, Continuous, [name], MD .  0.9 % NaCl with KCl 20 mEq/ L  infusion, , Intravenous, Continuous, [name], MD, Stopped at 08/29/19 [id number] .  acetaminophen (TYLENOL) tablet 650 mg, 650 mg, Oral, Q4H PRN **OR** acetaminophen (TYLENOL) suppository 650 mg, 650 mg, Rectal, Q4H PRN, [name], MD .  baclofen (LIORESAL) tablet 5 mg, 5 mg, Oral, TID, [name], MD, 5 mg at 08/30/19 1057 .  Chlorhexidine Gluconate Cloth 2 % PADS 6 each, 6 each, Topical, Daily, [name], MD, 6 each at 08/30/19 1054 .  dexamethasone (DECADRON) injection 4 mg, 4 mg, Intravenous, Q6H, 4 mg at 08/30/19 0420 **OR** dexamethasone (DECADRON) tablet 4 mg, 4 mg, Oral, Q6H, [name], MD, 4 mg at 08/30/19 1057 .  MEDLINE mouth rinse, 15 mL, Mouth Rinse, BID,  [name], MD, 15 mL at 08/30/19 0043 .  menthol-cetylpyridinium (CEPACOL) lozenge 3 mg, 1 lozenge, Oral, PRN, 3 mg at 08/29/19 2356 **OR** phenol (CHLORASEPTIC) mouth spray 1 spray, 1 spray, Mouth/Throat, PRN, [name], MD .  morphine 2 MG/ML injection 2 mg, 2 mg, Intravenous, Q2H PRN, [name], MD .  ondansetron [facility]) tablet 4 mg, 4 mg, Oral, Q6H PRN **OR** ondansetron (ZOFRAN) injection 4 mg, 4 mg, Intravenous, Q6H PRN, [name], MD .  oxyCODONE (Oxy IR/ROXICODONE) immediate release tablet 10 mg, 10 mg, Oral, Q3H PRN, [name], MD .  senna [facility]) tablet 8.6 mg, 1 tablet, Oral, BID, [name], MD, 8.6 mg at 08/30/19 1057 .  sodium chloride flush (NS) 0.9 % injection 3 mL, 3 mL, Intravenous, Q12H, [name], MD, 3 mL at 08/30/19 1057 .  sodium chloride flush (NS) 0.9 % injection 3 mL, 3 mL, Intravenous, PRN, [name], MD   Patients Current Diet:     Diet Order                      Diet full liquid Room service appropriate? Yes; Fluid consistency: Thin  Diet effective now                   Precautions / Restrictions Precautions Precautions: Fall, Cervical Precaution Booklet Issued: No Cervical Brace: Hard collar(when up OOB) Restrictions Weight Bearing Restrictions: No    Has the patient had 2 or more falls or a fall with injury in the past year?Yes, the fall that led to this admission.    Prior Activity Level Community (5-7x/wk): active prior to [employer], driving, no DME used   Prior Functional Level Prior Function Level of Independence: Independent Comments: loved gardening; Retired from school [employer]  Self Care: Did the patient need help bathing, dressing, using the toilet or eating?  Independent   Indoor Mobility: Did the patient need assistance with walking from room to room (with or without device)? Independent   Stairs: Did the patient need assistance with internal or external stairs (with or  without device)? Independent   Functional Cognition: Did the patient need help planning regular tasks such as shopping or remembering to take medications? Independent   Home Assistive Devices / Equipment Home Equipment: Environmental consultant - 2 wheels, Grab bars - tub/shower, Shower seat   Prior Device Use: Indicate devices/aids used by the patient prior to current illness, exacerbation or injury? None of the above   Current Functional Level Cognition   Overall Cognitive Status: Within Functional Limits for tasks assessed Orientation Level: Oriented X4    Extremity Assessment (includes Sensation/Coordination)   Upper Extremity Assessment: Defer to OT evaluation RUE Deficits / Details: able to complete hand to mouth supine but not sitting - lacks @ 3 inches form touching mouth; shoulder flex 35; abd 3/5; elbow flex 3/5; ext 3/5 (also has tone); wrist ext 2/5; flex 1/5; opssible 1/5 digit flex; no digit ext; no thumb movement RUE Sensation: decreased light touch, decreased proprioception RUE Coordination: decreased fine motor, decreased gross motor LUE Deficits / Details: able to complete hand to mouth and touch forehead; shoulder flex 3+/5; Abd 3+/5; elbow flex 3/5; ext 2+/5; pronation1/5; supination 3+/5; trace wrist ext/flex; no digit movement LUE Sensation: decreased proprioception, decreased light touch LUE Coordination: decreased fine motor, decreased gross motor  Lower Extremity Assessment: Defer to PT evaluation RLE Deficits / Details: Seated able to perform LAQ through partial range, lifts from hip off bed in supine, but cannot maintain, increased extensor tone with flexion in bed difficult, but easier than L, no active ankle movement noted, Dorsiflexion PROM limited to approx 8 degrees from neutral. RLE Sensation: (needs further assessment) LLE Deficits / Details: Seated able to perform LAQ throught partial range, unable to lift leg in supine, increased extensor tone making flexion difficult in  supine, demonstrates occasional spasms into extension while supine, no active ankle movement noted, passive movement limited by tightness in heel cords LLE Sensation: (needs further assessment)     ADLs   Overall ADL's : Needs assistance/impaired Eating/Feeding: Total assistance Grooming: Maximal assistance Grooming Details (indicate cue type and reason): able to use supination and weak tenodesis to bring cloth to mouth Upper Body Bathing: Maximal assistance, Bed level Lower Body Bathing: Total assistance, Bed level Upper Body Dressing : Total assistance, Bed level Lower Body Dressing: Total assistance, Bed level Toilet Transfer: +2 for physical assistance, Maximal assistance(with use of Stedy) Toileting- Clothing Manipulation and Hygiene: Total assistance Toileting - Clothing Manipulation Details (indicate cue type and reason): incontinent B/B Functional mobility during ADLs: +2 for physical assistance, Maximal assistance     Mobility   Overal bed mobility: Needs Assistance Bed Mobility: Rolling, Sidelying to Sit Rolling: +2 for physical assistance, Max assist Sidelying to sit: Max assist, +2 for physical assistance General bed mobility comments: pt attempted to move bilat UEs and LEs to assist in transfer but ultimated requiring maxAX2 for LE management and trunk elevation     Transfers   Overall transfer level: Needs assistance Transfer via Lift Equipment: Stedy Transfers: Sit to/from Stand Sit to Stand: Mod assist, +2 physical assistance General transfer comment: extensor tone noted, pt able power up, assist needed at bilat UEs as pt unable to grip and hold onto bar of stedy,  supported pt under bilat axilla and at hips with bed pad     Ambulation / Gait / Stairs / Wheelchair Mobility   Ambulation/Gait General Gait Details: unable at this time     Posture / Balance Dynamic Sitting Balance Sitting balance - Comments: worked on maintaining static sitting balance, less  retropulsion today, pt able to self correct to midline/neutral position with verbal cues. worked on leaning side to side and pushing up with hands, pt unable to maintain balance >5sec without requiring support.  Balance Overall balance assessment: Needs assistance Sitting-balance support: Feet supported Sitting balance-Leahy Scale: Poor Sitting balance - Comments: worked on maintaining static sitting balance, less retropulsion today, pt able to self correct to midline/neutral position with verbal cues. worked on leaning side to side and pushing up with hands, pt unable to maintain balance >5sec without requiring support.  Postural control: Posterior lean Standing balance support: Bilateral upper extremity supported Standing balance-Leahy Scale: Poor Standing balance comment: pt tolerated standing x 2 min, PT focused on bilat LE support and achieving bilat knee extension while OT focuse on trunk and upper body support. Also completed 5 min sit to stand with modAx2, noted active contraction of glut muscles     Special needs/care consideration Designated visitor [name] and [name] ([name] lives in [place] and would like to alternate with [name] if approved by MD)        Previous Home Environment (from acute therapy documentation) Living Arrangements: Spouse/significant other Available Help at Discharge: Available 24 hours/day Type of Home: [place]: One level Home Access: Stairs to enter Entrance Stairs-Rails: Can reach both Entrance Stairs-Number of Steps: 3 @ back, 4 @ front Bathroom Shower/Tub: Chiropodist: Handicapped height   Discharge Living Setting Plans for Discharge Living Setting: Patient's home Type of Home at Discharge: House Discharge Home Layout: One level Discharge Home Access: Stairs to enter Entrance Stairs-Rails: Left, Right Entrance Stairs-Number of Steps: 3-4 Discharge Bathroom Shower/Tub: [place] unit Discharge Bathroom Toilet: Handicapped  height Discharge Bathroom Accessibility: Yes How Accessible: Accessible via walker Does the patient have any problems obtaining your medications?: No   Social/Family/Support Systems Patient Roles: Spouse Anticipated Caregiver: spouse [name]), daughters [name] and [name]) Anticipated Caregiver's Contact Information: [name] [phone]; [name] [phone], [name] [phone] Ability/Limitations of Caregiver: [name] can provide supervision for mobility and up to mod assist for ADLs; [name] is able to assist if needed.  Karrie lives in [place] but can provide intermittent assist Caregiver Availability: 24/7 Discharge Plan Discussed with Primary Caregiver: Yes Is Caregiver In Agreement with Plan?: Yes Does Caregiver/Family have Issues with Lodging/Transportation while Pt is in Rehab?: No     Goals Patient/Family Goal for Rehab: PT/OT min/mod assist, SLP n/a Expected length of stay: 28-30 days Pt/Family Agrees to Admission and willing to participate: Yes Program Orientation Provided & Reviewed with Pt/Caregiver Including Roles  & Responsibilities: Yes  Barriers to Discharge: Insurance for SNF coverage, Inaccessible home environment     Decrease burden of Care through IP rehab admission: n/a   Possible need for SNF placement upon discharge: Not anticipated.  Pt with good family support and based on progress with therapies, has potential to reach an assistance level that family can manage at home.    Patient Condition: I have reviewed medical records from [facility], spoken with CM, and patient and daughter. I met with patient at the bedside for inpatient rehabilitation assessment.  Patient will benefit from ongoing PT and OT, can actively participate in 3 hours of  therapy a day 5 days of the week, and can make measurable gains during the admission.  Patient will also benefit from the coordinated team approach during an Inpatient Acute Rehabilitation admission.  The patient will receive intensive  therapy as well as Rehabilitation physician, nursing, social worker, and care management interventions.  Due to bladder management, bowel management, safety, skin/wound care, medication administration, pain management and patient education the patient requires 24 hour a day rehabilitation nursing.  The patient is currently mod +2 with mobility and max +2 with basic ADLs.  Discharge setting and therapy post discharge at home with home health is anticipated.  Patient has agreed to participate in the Acute Inpatient Rehabilitation Program and will admit pending insurance authorization.   Preadmission Screen Completed By:  Michel Santee, PT, 08/30/2019 11:19 AM ______________________________________________________________________   Discussed status with Dr. Dagoberto Ligas on 08/31/19 at 9:56 AM and received approval for admission today.   Admission Coordinator:  Michel Santee, PT, DPT time 9:56 AM Sudie Grumbling 08/31/19          Cosigned by: [name], MD at 08/31/2019 10:08 AM

## 2019-08-31 NOTE — Progress Notes (Signed)
Inpatient Rehab Admissions Coordinator:   I have insurance authorization and a bed available for pt to admit to CIR today.  Will let pt/family and TOC team know.   Shann Medal, PT, DPT Admissions Coordinator [phone] 08/31/19  9:54 AM

## 2019-08-31 NOTE — Progress Notes (Signed)
Pt discharged to CIR. Report was called to 4MW RN. All patient belongings were packed, PIV was removed, and patient was escorted to 4MW in bed with daughter, [name] accompanying.   Justice Rocher, RN

## 2019-08-31 NOTE — Care Management Important Message (Signed)
Important Message  Patient Details  Name: Meredith Roach MRN: [id number] Date of Birth: 1940-08-22   Medicare Important Message Given:  Yes     Emi Lymon Montine Circle 08/31/2019, 1:58 PM

## 2019-08-31 NOTE — Progress Notes (Signed)
Received report about 1530 today from [place], Therapist, sports. Patient is A&Ox4. Patient takes meds whole, lungs are CTA. Patient has poor grip but BLL are getting stronger. Skin intact minus the surgical incision to the neck. Family in to visit. Incontinent of bowel today. Catheter in place due to retention.

## 2019-09-01 ENCOUNTER — Inpatient Hospital Stay (HOSPITAL_COMMUNITY): Payer: Medicare PPO

## 2019-09-01 ENCOUNTER — Inpatient Hospital Stay (HOSPITAL_COMMUNITY): Payer: Medicare PPO | Admitting: Occupational Therapy

## 2019-09-01 ENCOUNTER — Inpatient Hospital Stay (HOSPITAL_COMMUNITY): Payer: Medicare PPO | Admitting: Speech Pathology

## 2019-09-01 ENCOUNTER — Inpatient Hospital Stay (HOSPITAL_COMMUNITY): Payer: Medicare PPO | Admitting: Physical Therapy

## 2019-09-01 DIAGNOSIS — G8250 Quadriplegia, unspecified: Secondary | ICD-10-CM

## 2019-09-01 DIAGNOSIS — M7989 Other specified soft tissue disorders: Secondary | ICD-10-CM

## 2019-09-01 LAB — COMPREHENSIVE METABOLIC PANEL
ALT: 29 U/L (ref 0–44)
AST: 25 U/L (ref 15–41)
Albumin: 3.4 g/dL — ABNORMAL LOW (ref 3.5–5.0)
Alkaline Phosphatase: 57 U/L (ref 38–126)
Anion gap: 9 (ref 5–15)
BUN: 18 mg/dL (ref 8–23)
CO2: 29 mmol/L (ref 22–32)
Calcium: 9.1 mg/dL (ref 8.9–10.3)
Chloride: 105 mmol/L (ref 98–111)
Creatinine, Ser: 0.88 mg/dL (ref 0.44–1.00)
GFR calc Af Amer: >60 mL/min (ref >60)
GFR calc non Af Amer: >60 mL/min (ref >60)
Glucose, Bld: 144 mg/dL — ABNORMAL HIGH (ref 70–99)
Potassium: 4.7 mmol/L (ref 3.5–5.1)
Sodium: 143 mmol/L (ref 135–145)
Total Bilirubin: 1 mg/dL (ref 0.3–1.2)
Total Protein: 6.2 g/dL — ABNORMAL LOW (ref 6.5–8.1)

## 2019-09-01 LAB — CBC WITH DIFFERENTIAL/PLATELET
Abs Immature Granulocytes: 0.19 10*3/uL — ABNORMAL HIGH (ref 0.00–0.07)
Basophils Absolute: 0 10*3/uL (ref 0.0–0.1)
Basophils Relative: 0 %
Eosinophils Absolute: 0 10*3/uL (ref 0.0–0.5)
Eosinophils Relative: 0 %
HCT: 42.1 % (ref 36.0–46.0)
Hemoglobin: 14 g/dL (ref 12.0–15.0)
Immature Granulocytes: 2 %
Lymphocytes Relative: 10 %
Lymphs Abs: 1.1 10*3/uL (ref 0.7–4.0)
MCH: 29 pg (ref 26.0–34.0)
MCHC: 33.3 g/dL (ref 30.0–36.0)
MCV: 87.3 fL (ref 80.0–100.0)
Monocytes Absolute: 0.5 10*3/uL (ref 0.1–1.0)
Monocytes Relative: 5 %
Neutro Abs: 8.6 10*3/uL — ABNORMAL HIGH (ref 1.7–7.7)
Neutrophils Relative %: 83 %
Platelets: 258 10*3/uL (ref 150–400)
RBC: 4.82 MIL/uL (ref 3.87–5.11)
RDW: 12.7 % (ref 11.5–15.5)
WBC: 10.4 10*3/uL (ref 4.0–10.5)
nRBC: 0 % (ref 0.0–0.2)

## 2019-09-01 MED ORDER — LIDOCAINE HCL URETHRAL/MUCOSAL 2 % EX GEL: 1.0000 "application " | Freq: Once | CUTANEOUS | Status: DC

## 2019-09-01 MED ORDER — BISACODYL 10 MG RE SUPP
10.0000 mg | Freq: Every day | RECTAL | Status: DC
  Administered 2019-09-01 – 2019-09-26 (×26): 10 mg via RECTAL
  Filled 2019-09-01 (×30): qty 1

## 2019-09-01 MED ORDER — LIDOCAINE HCL URETHRAL/MUCOSAL 2 % EX GEL: 1.0000 "application " | Freq: Every day | CUTANEOUS | Status: DC | PRN

## 2019-09-01 MED ORDER — CHLORHEXIDINE GLUCONATE CLOTH 2 % EX PADS
6.0000 | MEDICATED_PAD | Freq: Every day | CUTANEOUS | Status: DC
  Administered 2019-09-01 – 2019-09-29 (×14): 6 via TOPICAL

## 2019-09-01 NOTE — Evaluation (Signed)
Physical Therapy Assessment and Plan  Patient Details  Name: Meredith Roach MRN: [id number] Date of Birth: 1941-01-23  PT Diagnosis: Difficulty walking, Impaired sensation, Muscle weakness and Quadriplegia Rehab Potential: Good ELOS: 4 weeks   Today's Date: 09/01/2019 PT Individual Time: 0900-1015 PT Individual Time Calculation (min): 75 min    Problem List:  Patient Active Problem List   Diagnosis Date Noted  . Quadriparesis ([place]) 08/31/2019  . S/P cervical spinal fusion 08/28/2019  . 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 Radiculopathy 10/21/2016    Past Medical History:  Past Medical History:  Diagnosis Date  . Arthritis   . GERD (gastroesophageal reflux disease)   . Gout   . Neuromuscular disorder ([place])   . Pre-diabetes    Past Surgical History:  Past Surgical History:  Procedure Laterality Date  . ANTERIOR CERVICAL DECOMPRESSION/DISCECTOMY FUSION 4 LEVELS N/A 08/28/2019   Procedure: ANTERIOR CERVICAL DECOMPRESSION/DISCECTOMY FUSION CERVICALTHREE-FOUR CERVICAL,FOUR-FIVE,CERVICAL FIVE-SIX,CERVICAL SIX-SEVEN.;  Surgeon: [name], MD;  Location: [facility];  Service: Neurosurgery;  Laterality: N/A;  ANTERIOR  . BACK SURGERY    . CATARACT EXTRACTION W/PHACO Right 03/31/2018   Procedure: CATARACT EXTRACTION PHACO AND INTRAOCULAR LENS PLACEMENT ([place]);  Surgeon: [name], MD;  Location: [facility] ORS;  Service: Ophthalmology;  Laterality: Right;  [place] 00:39.7 CDE 4.35 Fluid Pack Lot # [id number]  . CHOLECYSTECTOMY  1995  . COLONOSCOPY WITH PROPOFOL N/A 06/07/2017   Procedure: COLONOSCOPY WITH PROPOFOL;  Surgeon: [name], MD;  Location: [facility] ENDOSCOPY;  Service: Endoscopy;  Laterality: N/A;  . ESOPHAGOGASTRODUODENOSCOPY (EGD) WITH PROPOFOL N/A 06/07/2017   Procedure: ESOPHAGOGASTRODUODENOSCOPY (EGD) WITH PROPOFOL;  Surgeon: [name], MD;  Location: [facility] ENDOSCOPY;  Service: Endoscopy;  Laterality: N/A;  . REDUCTION  MAMMAPLASTY Bilateral 1994    Assessment & Plan Clinical Impression:  Meredith Roach is a 79 y.o.right handed female with a history of gout as well as pre-diabetes.Per chart review she lives with spouse in [place].Independent prior to admission. 1 level home. Husband can assist. Presented 08/26/2019 after ground-level fall with resulting quadriparesis. 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 CT/MRI of the brain no acute intracranial abnormality. Admission chemistries unremarkable except glucose 123, CK of 93. Patient underwent decompressive anterior cervical discectomy C3-4 4-5, C5-6 and C6-7 with anterior cervical arthrodesis 08/28/2019 per Dr. Sherley Bounds. Decadron protocol as indicated. Aspen cervical collar applied as directed. Therapy evaluations completed and patient was admitted for a comprehensive rehab program. Patient transferred to CIR on 08/31/2019 .   Patient currently requires total with mobility secondary to muscle weakness, decreased cardiorespiratoy endurance, abnormal tone, unbalanced muscle activation and decreased coordination and decreased sitting balance, decreased standing balance, decreased postural control and decreased balance strategies.  Prior to hospitalization, patient was independent  with mobility and lived with Spouse in a House home.  Home access is 3 @ back, 4 @ frontStairs to enter.  Patient will benefit from skilled PT intervention to maximize safe functional mobility, minimize fall risk and decrease caregiver burden for planned discharge home with intermittent assist.  Anticipate patient will benefit from follow up [facility] at discharge.  PT - End of Session Activity Tolerance: Tolerates 30+ min activity with multiple rests Endurance Deficit: Yes Endurance Deficit Description: frequent rest breaks during functional activity PT Assessment Rehab Potential (ACUTE/IP  ONLY): Good PT Barriers to Discharge: Decreased caregiver support;Medical stability;Home environment access/layout;Incontinence;Neurogenic Bowel & Bladder  PT Barriers to Discharge Comments: husband cannot provide much physical assist, pt does not have 24/7 support otherwise PT Patient demonstrates impairments in the following area(s): Balance;Endurance;Motor;Safety;Sensory PT Transfers Functional Problem(s): Bed Mobility;Bed to Chair;Car;Furniture;Floor PT Locomotion Functional Problem(s): Ambulation;Wheelchair Mobility;Stairs PT Plan PT Intensity: Minimum of 1-2 x/day ,45 to 90 minutes PT Frequency: 5 out of 7 days PT Duration Estimated Length of Stay: 4 weeks PT Treatment/Interventions: Ambulation/gait training;Balance/vestibular training;Community reintegration;Discharge planning;Disease management/prevention;DME/adaptive equipment instruction;Functional electrical stimulation;Functional mobility training;Neuromuscular re-education;Pain management;Patient/family education;Psychosocial support;Splinting/orthotics;Stair training;Therapeutic Activities;Therapeutic Exercise;UE/LE Strength taining/ROM;UE/LE Coordination activities;Wheelchair propulsion/positioning PT Transfers Anticipated Outcome(s): min A PT Locomotion Anticipated Outcome(s): min A to mod A overall with LRAD PT Recommendation Recommendations for Other Services: Neuropsych consult;Therapeutic Recreation consult Therapeutic Recreation Interventions: Stress management Follow Up Recommendations: Home health PT;24 hour supervision/assistance Patient destination: Home Equipment Recommended: To be determined Equipment Details: TBD pending progress  Skilled Therapeutic Intervention Evaluation completed (see details above and below) with education on PT POC and goals and individual treatment initiated with focus on bed mobility and functional transfer assessment as well as obtaining appropriate equipment for pt. Pt received seated in bed  with her daughter Meredith Roach at bedside. Pt agreeable to PT eval with no complaints of pain. Assisted pt with donning thigh-high TEDs and pants at bed level dependently. Pt found to be incontinent of bowel. Rolling L/R with mod A for dependent pericare and donning of a brief. Supine BP 130/75. Supine to sit with assist x 2 for LE management and trunk control. Pt requires min A for sitting balance due to posterior bias in sitting. Seated BP 134/93. Sit to stand via 3 muskateers assist x 2. Once in standing pt with onset of BLE extensor tone, unable to take any steps. Sit to stand with assist x 2 to stedy. Stedy transfer to w/c. Pt left semi-reclined in TIS w/c in room with needs in reach at end of session. Demonstrated how to adjust TIS recline for pt's daughter to assist her as needed.  PT Evaluation Precautions/Restrictions Precautions Precautions: Fall;Cervical Precaution Comments: reviewed cervical precautions Required Braces or Orthoses: Cervical Brace Cervical Brace: Hard collar Restrictions Weight Bearing Restrictions: No Home Living/Prior Functioning Home Living Available Help at Discharge: Family;Available PRN/intermittently Type of Home: House Home Access: Stairs to enter [employer] of Steps: 3 @ back, 4 @ front Entrance Stairs-Rails: Can reach both Home Layout: One level Additional Comments: pt's husband can provide setup A/Supervision, cannot provide much physical assist; daughters work full time available in the evening  Lives With: Spouse Prior Function Level of Independence: Independent with gait;Independent with transfers  Able to Take Stairs?: Yes Driving: Yes Vocation: Retired Leisure: Hobbies-yes (Comment) Comments: loved gardening; Retired from Dietitian: Within [place]: Intact  Cognition Overall Cognitive Status: Within Functional Limits for tasks  assessed Arousal/Alertness: Awake/alert Orientation Level: Oriented X4 Attention: Focused Focused Attention: Appears intact Memory: Appears intact Awareness: Appears intact Problem Solving: Appears intact Safety/Judgment: Appears intact Sensation Sensation Light Touch: Impaired Detail Light Touch Impaired Details: Impaired RUE;Impaired LUE Proprioception: Impaired by gross assessment Coordination Gross Motor Movements are Fluid and Coordinated: No Fine Motor Movements are Fluid and Coordinated: No Coordination and Movement Description: 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 Motor  Motor Motor: Tetraplegia;Abnormal tone;Abnormal postural alignment and control Motor - Skilled Clinical Observations: incomplete tetraplegia  Mobility Bed Mobility Bed Mobility: Rolling Right;Rolling Left;Supine to Sit;Sit to Supine Rolling Right: Moderate Assistance - Patient 50-74% Rolling Left: Moderate Assistance - Patient 50-74% Supine to Sit:  2 Helpers Sit to Supine: 2 Helpers Transfers Transfers: Sit to [employer] via Scientist, research (life sciences) to Stand: 2 Press photographer via Lift Equipment: Haematologist / Scientist, research (life sciences): No Architect: No  Trunk/Postural Assessment  Cervical Assessment Cervical Assessment: Exceptions to WFL(cervical precautions) Thoracic Assessment Thoracic Assessment: Exceptions to WFL(rounded shoulders) Lumbar Assessment Lumbar Assessment: Exceptions to WFL(posterior pelvic tilt) Postural Control Postural Control: Deficits on evaluation(delayed; insufficient)  Balance Balance Balance Assessed: Yes Static Sitting Balance Static Sitting - Balance Support: Bilateral upper extremity supported;Feet supported Static Sitting - Level of Assistance: 4: Min assist Dynamic Sitting Balance Dynamic Sitting - Balance Support: No upper extremity supported;Feet supported;During functional activity Dynamic Sitting - Level of  Assistance: 3: Mod assist Static Standing Balance Static Standing - Balance Support: Bilateral upper extremity supported;During functional activity Static Standing - Level of Assistance: 1: +2 Total assist Extremity Assessment   RLE Assessment RLE Assessment: Exceptions to [facility] General Strength Comments: impaired, see below RLE Strength Right Hip Flexion: 2+/5 Right Knee Flexion: 3/5 Right Knee Extension: 4-/5 Right Ankle Dorsiflexion: 1/5 Right Ankle Plantar Flexion: 3-/5 LLE Assessment LLE Assessment: Exceptions to [facility] General Strength Comments: impaired, see below LLE Strength Left Hip Flexion: 2/5 Left Knee Flexion: 3/5 Left Knee Extension: 3+/5 Left Ankle Dorsiflexion: 0/5 Left Ankle Plantar Flexion: 3-/5    Refer to Care Plan for Long Term Goals  Recommendations for other services: Neuropsych and Therapeutic Recreation  Stress management  Discharge Criteria: Patient will be discharged from PT if patient refuses treatment 3 consecutive times without medical reason, if treatment goals not met, if there is a change in medical status, if patient makes no progress towards goals or if patient is discharged from hospital.  The above assessment, treatment plan, treatment alternatives and goals were discussed and mutually agreed upon: by patient and by family   Excell Seltzer, PT, DPT 09/01/2019, 12:56 PM

## 2019-09-01 NOTE — Progress Notes (Signed)
Bilateral lower extremity venous duplex has been completed. Preliminary results can be found in CV Proc through chart review.   09/01/19 11:37 AM Carlos Levering RVT

## 2019-09-01 NOTE — H&P (Signed)
Physical Medicine and Rehabilitation Admission H&P        Chief Complaint  Patient presents with  . Numbness  . Fall  . Weakness  : HPI: Meredith Roach is a 79 y.o.right handed female with a history of gout as well as pre-diabetes.Per chart review she lives with spouse in [place].Independent prior to admission.  1 level home.  Husband can assist.  Presented 08/26/2019 after ground-level fall with resulting quadriparesis.  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  CT/MRI of the brain no acute intracranial abnormality.  Admission chemistries unremarkable except glucose 123, CK of 93.  Patient underwent decompressive anterior cervical discectomy C3-4 4-5, C5-6 and C6-7 with anterior cervical arthrodesis 08/28/2019 per Dr. Sherley Bounds.  Decadron protocol as indicated.  Aspen cervical collar applied as directed.  Therapy evaluations completed and patient was admitted for a comprehensive rehab program     Has foley due to urinary retention since fall- also can't tell when needs to have BM- and cannot control it.  Sleeping OK No significant pain Having a lot of spasms/tone- annoying and gets in the way.  Of function/moving.  Hands up to elbows are real numb- has been this way a long time- since before the fall.        Review of Systems  Constitutional: Negative for chills and fever.  HENT: Negative for hearing loss.   Eyes: Negative for blurred vision and double vision.  Respiratory: Negative for cough and shortness of breath.   Cardiovascular: Negative for chest pain, palpitations and leg swelling.  Gastrointestinal: Positive for constipation. Negative for heartburn, nausea and vomiting.       GERD  Genitourinary: Negative for dysuria, flank pain and hematuria.  Musculoskeletal: Positive for myalgias.  Skin: Negative for rash.  Neurological: Positive for sensory change and weakness.  All other  systems reviewed and are negative.       Past Medical History:  Diagnosis Date  . Arthritis    . GERD (gastroesophageal reflux disease)    . Gout    . Pre-diabetes           Past Surgical History:  Procedure Laterality Date  . ANTERIOR CERVICAL DECOMPRESSION/DISCECTOMY FUSION 4 LEVELS N/A 08/28/2019    Procedure: ANTERIOR CERVICAL DECOMPRESSION/DISCECTOMY FUSION CERVICALTHREE-FOUR CERVICAL,FOUR-FIVE,CERVICAL FIVE-SIX,CERVICAL SIX-SEVEN.;  Surgeon: [name], MD;  Location: [facility];  Service: Neurosurgery;  Laterality: N/A;  ANTERIOR  . CATARACT EXTRACTION W/PHACO Right 03/31/2018    Procedure: CATARACT EXTRACTION PHACO AND INTRAOCULAR LENS PLACEMENT ([place]);  Surgeon: [name], MD;  Location: [facility] ORS;  Service: Ophthalmology;  Laterality: Right;  [place] 00:39.7 CDE 4.35 Fluid Pack Lot # [id number]  . CHOLECYSTECTOMY   1995  . COLONOSCOPY WITH PROPOFOL N/A 06/07/2017    Procedure: COLONOSCOPY WITH PROPOFOL;  Surgeon: [name], MD;  Location: [facility] ENDOSCOPY;  Service: Endoscopy;  Laterality: N/A;  . ESOPHAGOGASTRODUODENOSCOPY (EGD) WITH PROPOFOL N/A 06/07/2017    Procedure: ESOPHAGOGASTRODUODENOSCOPY (EGD) WITH PROPOFOL;  Surgeon: [name], MD;  [facility];  Service: Endoscopy;  Laterality: N/A;  . REDUCTION MAMMAPLASTY Bilateral 1994         Family History  Problem Relation Age of Onset  . Breast cancer Maternal Aunt    . Breast cancer Paternal Aunt    . CAD Father    . Heart attack Father      Social History:  reports that she has never smoked. She  has never used smokeless tobacco. She reports that she does not drink alcohol or use drugs. Allergies:      Allergies  Allergen Reactions  . Ace Inhibitors Anaphylaxis    Medications Prior to Admission  Medication Sig Dispense Refill  . acetaminophen (TYLENOL) 500 MG tablet Take 1,000 mg by mouth daily as needed for moderate pain or headache.      . Glucosamine-Chondroit-Vit C-Mn (GLUCOSAMINE CHONDR  1500 COMPLX PO) Take 1 tablet by mouth 2 (two) times daily.      . hydrochlorothiazide (HYDRODIURIL) 25 MG tablet Take 12.5 mg by mouth daily.       Marland Kitchen omeprazole (PRILOSEC) 40 MG capsule Take 40 mg by mouth at bedtime.          Drug Regimen Review Drug regimen was reviewed and remains appropriate with no significant issues identified   Home: Home Living Family/patient expects to be discharged to:: Private residence Living Arrangements: Spouse/significant other Available Help at Discharge: Available 24 hours/day Type of Home: House Home Access: Stairs to enter [employer] of Steps: 3 @ back, 4 @ front Entrance Stairs-Rails: Can reach both Home Layout: One level Bathroom Shower/Tub: Chiropodist: Handicapped height Home Equipment: Environmental consultant - 2 wheels, Grab bars - tub/shower, Industrial/product designer History: Prior Function Level of Independence: Independent Comments: loved gardening; Retired from Insurance claims handler Status:  Mobility: Bed Mobility Overal bed mobility: Needs Assistance Bed Mobility: Rolling, Sidelying to Sit Rolling: +2 for physical assistance, Max assist Sidelying to sit: Max assist, +2 for physical assistance General bed mobility comments: pt attempted to move bilat UEs and LEs to assist in transfer but ultimated requiring maxAX2 for LE management and trunk elevation Transfers Overall transfer level: Needs assistance Transfer via Lift Equipment: Stedy Transfers: Sit to/from Stand Sit to Stand: Mod assist, +2 physical assistance General transfer comment: extensor tone noted, pt able power up, assist needed at bilat UEs as pt unable to grip and hold onto bar of stedy, supported pt under bilat axilla and at hips with bed pad Ambulation/Gait General Gait Details: unable at this time   ADL: ADL Overall ADL's : Needs assistance/impaired Eating/Feeding: Total assistance Grooming: Maximal assistance Grooming  Details (indicate cue type and reason): able to use supination and weak tenodesis to bring cloth to mouth Upper Body Bathing: Maximal assistance, Bed level Lower Body Bathing: Total assistance, Bed level Upper Body Dressing : Total assistance, Bed level Lower Body Dressing: Total assistance, Bed level Toilet Transfer: +2 for physical assistance, Maximal assistance(with use of Stedy) Toileting- Clothing Manipulation and Hygiene: Total assistance Toileting - Clothing Manipulation Details (indicate cue type and reason): incontinent B/B Functional mobility during ADLs: +2 for physical assistance, Maximal assistance   Cognition: Cognition Overall Cognitive Status: Within Functional Limits for tasks assessed Orientation Level: Oriented X4 Cognition Arousal/Alertness: Awake/alert Behavior During Therapy: WFL for tasks assessed/performed Overall Cognitive Status: Within Functional Limits for tasks assessed   Physical Exam: Blood pressure 122/62, pulse 82, temperature 98.2 F (36.8 C), temperature source Oral, resp. rate 20, height 5\' 3"  (1.6 m), weight 85.3 kg, SpO2 95 %. Physical Exam  Nursing note and vitals reviewed. Constitutional: She is oriented to person, place, and time. She appears well-developed and well-nourished.  Older female laying supine in bed; daughter and husband at bedside, NAD Not wearing collar since not up.   HENT:  Head: Normocephalic and atraumatic.  Nose: Nose normal.  Mouth/Throat: Oropharynx is clear and moist. No oropharyngeal exudate.  Equal smile; facial sensation intact  Eyes: Conjunctivae are normal. Right eye exhibits no discharge. Left eye exhibits no discharge.  EOMI B/L- no nystagmus  Neck: No tracheal deviation present.  R anterior neck incision looks good- covered with steristrips- no drainage Decreased neck ROM  Cardiovascular:  RRR- no JVD  Respiratory:  CTA B/L- good air movement B/L  GI:  Soft, NT, ND, (+)BS   Musculoskeletal:     Cervical  back: Neck supple.     Comments: RUE- biceps 4/5, WE 3/5, triceps 4/5, grip and finger abd 0/5 LUE- biceps 4/5, WE 3/5, triceps 3/5, grip and finger abd 0/5 Per pt,  LE strength is new/today RLE- HF 2+/5, KE 4-/5, DF 1/5, PF 3/5, EHL 0/5 LLE- HF 2/5, KE 3+/5, DF 0/5, PF 3-/5, EHL 1/5   Neurological: She is alert and oriented to person, place, and time.  Patient is alert in no acute distress.  Makes eye contact with examiner and oriented x3. Pt's entire sensory exam done- checked each dermatome individually- which took 20 minutes by itself C4 intact; C5 intact on R; decreased on L; C6-T1 decreased B/L, but absent on L T2-T12- decreased B/L- worse on L L1-L3 nml on R- decreased on L; L4-S1 R nml; L decreased; S3-S5 decreased B/L Has Hoffman's B/L; no clonus Significant, severe extensor tone with simple ROM- no increased tone at rest  Skin: Skin is warm and dry.  1 IV L hand- looks good- no infiltrate No issues on heels or bottom- skin intact  Psychiatric: She has a normal mood and affect.      Lab Results Last 48 Hours  No results found for this or any previous visit (from the past 48 hour(s)).    Imaging Results (Last 48 hours)  DG Cervical Spine 2-3 Views   Result Date: 08/28/2019 CLINICAL DATA:  Provided history: Surgery, elective. Surgery ACDF C3-C7. Provided fluoroscopy time 38 seconds. EXAM: CERVICAL SPINE - 2-3 VIEW; DG C-ARM 1-60 MIN COMPARISON:  Cervical spine MRI 06/26/2019 FINDINGS: Three fluoroscopic images of the cervical spine are submitted. The fluoroscopic images demonstrate sequela of C3-C7 ACDF with ventral fusion hardware and interbody spacers at these levels. Superimposition of the shoulders partially obscures the lower cervical levels on the lateral view images. No unexpected finding. Partially visualized support tubes. IMPRESSION: Intraoperative fluoroscopic images of the cervical spine from C3-C7 ACDF as described. No unexpected finding. Electronically Signed   By:  Kellie Simmering DO   On: 08/28/2019 16:37    DG C-Arm 1-60 Min   Result Date: 08/28/2019 CLINICAL DATA:  Provided history: Surgery, elective. Surgery ACDF C3-C7. Provided fluoroscopy time 38 seconds. EXAM: CERVICAL SPINE - 2-3 VIEW; DG C-ARM 1-60 MIN COMPARISON:  Cervical spine MRI 06/26/2019 FINDINGS: Three fluoroscopic images of the cervical spine are submitted. The fluoroscopic images demonstrate sequela of C3-C7 ACDF with ventral fusion hardware and interbody spacers at these levels. Superimposition of the shoulders partially obscures the lower cervical levels on the lateral view images. No unexpected finding. Partially visualized support tubes. IMPRESSION: Intraoperative fluoroscopic images of the cervical spine from C3-C7 ACDF as described. No unexpected finding. Electronically Signed   By: Kellie Simmering DO   On: 08/28/2019 16:37             Medical Problem List and Plan: 1.  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  Status post ACDF 08/28/2019.  Decadron taper.  Cervical collar as directed.             -  need to order PRAFOs to prevent Ankle contractures             -still on Decadron taper             -patient may  shower             -ELOS/Goals: 4 weeks- goals min-mod assist 2.  Antithrombotics: -DVT/anticoagulation: SCDs.  Check vascular study and check with [facility] to see if can start Lovenox             -antiplatelet therapy: N/A 3. Pain Management: Baclofen 5 mg 3 times daily- will likely need to increase, oxycodone as needed 4. Mood: Provide emotional support             -antipsychotic agents: N/A 5. Neuropsych: This patient is capable of making decisions on her own behalf. 6. Skin/Wound Care: Routine skin checks and turn while in bed every 2 hours while awake- keep on sides at night.  7. Fluids/Electrolytes/Nutrition: Routine in and outs with follow-up chemistries 8.  Neurogenic bowel and bladder.  con't foley until get her bowels  cleaned out- can remove foley IF pt wants to d/c, and is able to do on own at home- if not, might need foley long term;.  Start bowel program.  Provide education. 9.  GERD.  Protonix 10. Hx of prediabetes- check an A1c and monitor as required.  11. Hx of gout- this acute event could be a trigger for gout- will monitor.      Lavon Paganini Angiulli, PA-C 08/30/2019    I have personally performed a face to face diagnostic evaluation of this patient and formulated the key components of the plan.  Additionally, I have personally reviewed laboratory data, imaging studies, as well as relevant notes and concur with the physician assistant's documentation above.   The patient's status has not changed from the original H&P.  Any changes in documentation from the acute care chart have been noted above.

## 2019-09-01 NOTE — Progress Notes (Signed)
[facility] PHYSICAL MEDICINE & REHABILITATION PROGRESS NOTE   Subjective/Complaints:   Pt reports fed self 1/2 of breakfast today- very excited- slept well - denies pain- didn't get bowel program last night, for some reason.   ROS:  Pt denies SOB, abd pain, CP, N/V/C/D, and vision changes   Objective:   VAS [place] LOWER EXTREMITY VENOUS (DVT)  Result Date: 09/01/2019  Lower Venous DVT Study Indications: Swelling.  Risk Factors: Trauma. Limitations: Immobility, patient positioning. Comparison Study: No prior studies. Performing Technologist: Oliver Hum RVT  Examination Guidelines: A complete evaluation includes B-mode imaging, spectral Doppler, color Doppler, and power Doppler as needed of all accessible portions of each vessel. Bilateral testing is considered an integral part of a complete examination. Limited examinations for reoccurring indications may be performed as noted. The reflux portion of the exam is performed with the patient in reverse Trendelenburg.  +---------+---------------+---------+-----------+----------+--------------+ RIGHT    CompressibilityPhasicitySpontaneityPropertiesThrombus Aging +---------+---------------+---------+-----------+----------+--------------+ CFV      Full           Yes      Yes                                 +---------+---------------+---------+-----------+----------+--------------+ SFJ      Full                                                        +---------+---------------+---------+-----------+----------+--------------+ FV Prox  Full                                                        +---------+---------------+---------+-----------+----------+--------------+ FV Mid   Full                                                        +---------+---------------+---------+-----------+----------+--------------+ FV DistalFull                                                         +---------+---------------+---------+-----------+----------+--------------+ PFV      Full                                                        +---------+---------------+---------+-----------+----------+--------------+ POP      Full           Yes      Yes                                 +---------+---------------+---------+-----------+----------+--------------+ PTV      Full                                                        +---------+---------------+---------+-----------+----------+--------------+  PERO     Full                                                        +---------+---------------+---------+-----------+----------+--------------+   +---------+---------------+---------+-----------+----------+--------------+ LEFT     CompressibilityPhasicitySpontaneityPropertiesThrombus Aging +---------+---------------+---------+-----------+----------+--------------+ CFV      Full           Yes      Yes                                 +---------+---------------+---------+-----------+----------+--------------+ SFJ      Full                                                        +---------+---------------+---------+-----------+----------+--------------+ FV Prox  Full                                                        +---------+---------------+---------+-----------+----------+--------------+ FV Mid   Full                                                        +---------+---------------+---------+-----------+----------+--------------+ FV DistalFull                                                        +---------+---------------+---------+-----------+----------+--------------+ PFV      Full                                                        +---------+---------------+---------+-----------+----------+--------------+ POP      Full           Yes      Yes                                  +---------+---------------+---------+-----------+----------+--------------+ PTV      Full                                                        +---------+---------------+---------+-----------+----------+--------------+ PERO     Full                                                        +---------+---------------+---------+-----------+----------+--------------+  Summary: RIGHT: - There is no evidence of deep vein thrombosis in the lower extremity.  - No cystic structure found in the popliteal fossa.  LEFT: - There is no evidence of deep vein thrombosis in the lower extremity.  - No cystic structure found in the popliteal fossa.  *See table(s) above for measurements and observations.    Preliminary    Recent Labs    09/01/19 0713  WBC 10.4  HGB 14.0  HCT 42.1  PLT 258   Recent Labs    09/01/19 0713  NA 143  K 4.7  CL 105  CO2 29  GLUCOSE 144*  BUN 18  CREATININE 0.88  CALCIUM 9.1    Intake/Output Summary (Last 24 hours) at 09/01/2019 1455 Last data filed at 09/01/2019 0700 Gross per 24 hour  Intake 118 ml  Output 2600 ml  Net -2482 ml     Physical Exam: Vital Signs Blood pressure 130/66, pulse 61, temperature 97.8 F (36.6 C), temperature source Oral, resp. rate 16, height 5\' 3"  (1.6 m), weight 83.3 kg, SpO2 98 %.  Physical Exam Nursing noteand vitalsreviewed. Constitutional: Grey haired female sitting up slightly in bed- wearing collar; daughter at bedside, appropriate, smiling some, NAD HENT: conjugate gaze  R anterior neck incision covered with steristrips-no drainage- looks good Decreased neck ROM Cardiovascular:RRR- no JVD Respiratory:CTA b/L [id number], NT, ND, (+)BS Musculoskeletal:  Comments: RUE- biceps 4/5, WE 3/5, triceps 4/5, grip and finger abd 0/5 LUE- biceps 4/5, WE 3/5, triceps 3/5, grip and finger abd 0/5 Per pt, LE strength is new/today RLE- HF 2+/5, KE 4-/5, DF 1/5, PF 3/5, EHL 0/5 LLE- HF 2/5, KE 3+/5, DF 0/5, PF 3-/5,  EHL 1/5 Neurological: Ox3 Pt's entire sensory exam done- checked each dermatome individually- which took 20 minutes by itself C4 intact; C5 intact on R; decreased on L; C6-T1 decreased B/L, but absent on L T2-T12- decreased B/L- worse on L L1-L3 nml on R- decreased on L; L4-S1 R nml; L decreased; S3-S5 decreased B/L Has Hoffman's B/L; no clonus Significant, severe extensor tone with simple ROM- no increased tone at rest Skin: Skin iswarmand dry. 1 IV L hand- looks good- no infiltrate No issues on heels or bottom- skin intact Psychiatric:bright affect     Assessment/Plan: 1. 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  Physiatrist is providing close team supervision and 24 hour management of active medical problems listed below.  Physiatrist and rehab team continue to assess barriers to discharge/monitor patient progress toward functional and medical goals  Care Tool:  Bathing              Bathing assist       Upper Body Dressing/Undressing Upper body dressing   What is the patient wearing?: Hospital gown only    Upper body assist Assist Level: Dependent - Patient 0%    Lower Body Dressing/Undressing Lower body dressing      What is the patient wearing?: Hospital gown only     Lower body assist       Toileting Toileting    Toileting assist       Transfers Chair/bed transfer  Transfers assist     Chair/bed transfer assist level: Dependent - mechanical lift(stedy)     Locomotion Ambulation   Ambulation assist   Ambulation activity did not occur: Safety/medical concerns          Walk 10 feet activity  Assist  Walk 10 feet activity did not occur: Safety/medical concerns        Walk 50 feet activity   Assist Walk 50 feet with 2 turns activity did not occur: Safety/medical concerns         Walk 150 feet  activity   Assist Walk 150 feet activity did not occur: Safety/medical concerns         Walk 10 feet on uneven surface  activity   Assist Walk 10 feet on uneven surfaces activity did not occur: Safety/medical concerns         Wheelchair     Assist Will patient use wheelchair at discharge?: Yes Type of Wheelchair: (TBD)    Wheelchair assist level: Dependent - Patient 0% Max wheelchair distance: 150'    Wheelchair 50 feet with 2 turns activity    Assist        Assist Level: Dependent - Patient 0%   Wheelchair 150 feet activity     Assist      Assist Level: Dependent - Patient 0%   Blood pressure 130/66, pulse 61, temperature 97.8 F (36.6 C), temperature source Oral, resp. rate 16, height 5\' 3"  (1.6 m), weight 83.3 kg, SpO2 98 %.  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 Status post ACDF 08/28/2019. Decadron taper. Cervical collar as directed. -need to order PRAFOs to prevent Ankle contractures -still on Decadron taper -patient may shower -ELOS/Goals: 4 weeks- goals min-mod assist 2. Antithrombotics: -DVT/anticoagulation:SCDs. Check vascular study and check with [facility] to see if can start Lovenox 5/21- Dopplers (-) for DVT- need ot see when can start Lovenox -antiplatelet therapy: N/A 3. Pain Management:Baclofen 5 mg 3 times daily- will likely need to increase, oxycodone as needed 4. Mood:Provide emotional support -antipsychotic agents: N/A 5. Neuropsych: This patientiscapable of making decisions on herown behalf. 6. Skin/Wound Care:Routine skin checksand turn while in bed every 2 hours while awake- keep on sides at night. 7. Fluids/Electrolytes/Nutrition:Routine in and outs with follow-up chemistries 8. Neurogenic bowel and bladder. con't foley until get her bowels  cleaned out- can remove foley IF pt wants to d/c, and is able to do on own at home- if not, might need foley long term;. [place] program. Provide education.  5/21- ordered bowel program and explained to pt and daughter in layman's terms- will con't foley for now. Will order Lidocaine jelly for bowel program 9. GERD. Protonix 10. Hx of prediabetes- check an A1c and monitor as required.   5/21- can't find order- reordered for Monday- CBG 140s on BMP- since on decadron- if goes higher, will monitor more regularly.  11. Hx of gout- this acute event could be a trigger for gout- will monitor.     LOS: 1 days A FACE TO FACE EVALUATION WAS PERFORMED  Megan Lovorn 09/01/2019, 2:55 PM

## 2019-09-01 NOTE — Evaluation (Signed)
Speech Language Pathology Clinical Swallow Evaluation   Patient Details  Name: Meredith Roach MRN: [id number] Date of Birth: [birth year]  SLP Diagnosis: Dysphagia  Rehab Potential: Excellent ELOS: 7-10 days for SLP    Today's Date: 09/01/2019 SLP Individual Time: 0725-0820 SLP Individual Time Calculation (min): 55 min   Problem List:  Patient Active Problem List   Diagnosis Date Noted  . Quadriplegia ([place]) 08/31/2019  . S/P cervical spinal fusion 08/28/2019  . 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 Radiculopathy 10/21/2016   Past Medical History:  Past Medical History:  Diagnosis Date  . Arthritis   . GERD (gastroesophageal reflux disease)   . Gout   . Neuromuscular disorder ([place])   . Pre-diabetes    Past Surgical History:  Past Surgical History:  Procedure Laterality Date  . ANTERIOR CERVICAL DECOMPRESSION/DISCECTOMY FUSION 4 LEVELS N/A 08/28/2019   Procedure: ANTERIOR CERVICAL DECOMPRESSION/DISCECTOMY FUSION CERVICALTHREE-FOUR CERVICAL,FOUR-FIVE,CERVICAL FIVE-SIX,CERVICAL SIX-SEVEN.;  Surgeon: [name], MD;  Location: [facility];  Service: Neurosurgery;  Laterality: N/A;  ANTERIOR  . BACK SURGERY    . CATARACT EXTRACTION W/PHACO Right 03/31/2018   Procedure: CATARACT EXTRACTION PHACO AND INTRAOCULAR LENS PLACEMENT ([place]);  Surgeon: [name], MD;  Location: [facility];  Service: Ophthalmology;  Laterality: Right;  [place] 00:39.7 CDE 4.35 Fluid Pack Lot # [id number]  . CHOLECYSTECTOMY  1995  . COLONOSCOPY WITH PROPOFOL N/A 06/07/2017   Procedure: COLONOSCOPY WITH PROPOFOL;  Surgeon: [name], MD;  Location[facility] ENDOSCOPY;  Service: Endoscopy;  Laterality: N/A;  . ESOPHAGOGASTRODUODENOSCOPY (EGD) WITH PROPOFOL N/A 06/07/2017   Procedure: ESOPHAGOGASTRODUODENOSCOPY (EGD) WITH PROPOFOL;  Surgeon: [name], MD;  Location: [facility] ENDOSCOPY;  Service: Endoscopy;  Laterality: N/A;  . REDUCTION MAMMAPLASTY Bilateral 1994     Assessment / Plan / Recommendation Clinical Impression Patient is a 79 y.o.right-handed femalewith history of gout as well as prediabetes.Presented 08/26/2019 after ground-level fall with resultant quadriparesis. 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 CT/MRI of the brain no acute intracranial abnormality. Patient underwent decompressive anterior cervical discectomy C3-4, 4-5, C5-6, 6-7 with anterior cervical arthrodesis 08/28/2019 per Dr. Sherley Bounds. Decadron protocol as indicated. Aspen cervical collar applied as directed. Physical occupational therapy evaluationscompleted and recommendations were made for CIR due to functional decline due to spinal cord injury.Patient admitted 08/31/19.  Patient's swallowing function appears WFL with thin liquids and pureed textures. Patient reports she feels her swallowing is back to baseline, however, initial throat clearing was noted with soft solids, suspect due to post-operative edema. However, throat clearing was eliminated with small bites and a slower rate of self-feeding.  Recommend patient initiate Dys. 2 textures and continue thin liquids. SLP f/u is needed for tolerance of current diet upgrade and trials of upgraded textures. Both the patient and her daughter verbalized understanding.     Skilled Therapeutic Interventions          Administered a clinical swallow evaluation, please see above for details. Provided education to the patient and her daughter regarding overt s/s of aspiration and possible etiology of mild s/s of aspiration. Both verbalized understanding.    SLP Assessment  Patient will need skilled Speech Lanaguage Pathology Services during CIR admission    Recommendations  SLP Diet Recommendations: Dysphagia 2 (Fine chop);Thin Liquid Administration via: Straw Medication Administration: Whole meds with liquid Supervision: Staff to  assist with self feeding;Full supervision/cueing for compensatory strategies Compensations: Slow  rate;Small sips/bites Postural Changes and/or Swallow Maneuvers: Seated upright 90 degrees Oral Care Recommendations: Oral care BID Patient destination: Home Follow up Recommendations: None Equipment Recommended: None recommended by SLP    SLP Frequency 1 to 3 out of 7 days   SLP Duration  SLP Intensity  SLP Treatment/Interventions 7-10 days for SLP  Minumum of 1-2 x/day, 30 to 90 minutes  Dysphagia/aspiration precaution training;Cueing hierarchy;Patient/family education;Functional tasks;Therapeutic Activities    Pain Pain Assessment Pain Score: 0-No pain  Prior Functioning Type of Home: House  Lives With: Spouse Available Help at Discharge: Family;Available PRN/intermittently Vocation: Retired  Loss adjuster, [employer]ered General Date of Onset: 08/26/19 Previous Swallow Assessment: BSE on 5/19: Recommended Dys. 2 textures with thin liquids, however, on a full liquid diet Diet Prior to this Study: Thin liquids(Full liquids) Temperature Spikes Noted: No Respiratory Status: Room air History of Recent Intubation: No Behavior/Cognition: Alert;Cooperative;Pleasant mood Oral Cavity - Dentition: Adequate natural dentition Self-Feeding Abilities: Needs set up;Needs assist;Able to feed self with adaptive devices Vision: Functional for self-feeding Patient Positioning: Upright in bed Baseline Vocal Quality: Normal Volitional Cough: Strong Volitional Swallow: Able to elicit  Ice Chips Ice chips: Not tested Thin Liquid Thin Liquid: Within functional limits Presentation: Straw Nectar Thick Nectar Thick Liquid: Not tested Honey Thick Honey Thick Liquid: Not tested Puree Puree: Within functional limits Presentation: Spoon Solid Solid: Impaired Pharyngeal Phase Impairments: Throat Clearing - Immediate Other Comments: X2 out of 4 oz of pudding mixed with graham crackers BSE  Assessment Risk for Aspiration Impact on safety and function: Mild aspiration risk  Short Term Goals: Week 1: SLP Short Term Goal 1 (Week 1): Patient will consume current diet with minimal overt s/s of aspiration with Mod I for use of swallowing compensatory strategies. SLP Short Term Goal 2 (Week 1): Patient will consume trials of Dys. 3 textures without overt s/s of aspiration over 2 sessions with Mod I prior to upgrade.  Refer to Care Plan for Long Term Goals  Recommendations for other services: None   Discharge Criteria: Patient will be discharged from SLP if patient refuses treatment 3 consecutive times without medical reason, if treatment goals not met, if there is a change in medical status, if patient makes no progress towards goals or if patient is discharged from hospital.  The above assessment, treatment plan, treatment alternatives and goals were discussed and mutually agreed upon: by patient and by family  PAYNE, COURTNEY 09/01/2019, 3:23 PM

## 2019-09-01 NOTE — Evaluation (Signed)
Occupational Therapy Assessment and Plan  Patient Details  Name: Meredith Roach MRN: [id number] Date of Birth: 11/13/1940  OT Diagnosis: quadriparesis at level C3-7 Rehab Potential: Rehab Potential (ACUTE ONLY): Good ELOS: 28-30 days   Today's Date: 09/01/2019 OT Individual Time: 1300-1400 OT Individual Time Calculation (min): 60 min     Problem List:  Patient Active Problem List   Diagnosis Date Noted  . Quadriplegia ([place]) 08/31/2019  . S/P cervical spinal fusion 08/28/2019  . 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 Radiculopathy 10/21/2016    Past Medical History:  Past Medical History:  Diagnosis Date  . Arthritis   . GERD (gastroesophageal reflux disease)   . Gout   . Neuromuscular disorder ([place])   . Pre-diabetes    Past Surgical History:  Past Surgical History:  Procedure Laterality Date  . ANTERIOR CERVICAL DECOMPRESSION/DISCECTOMY FUSION 4 LEVELS N/A 08/28/2019   Procedure: ANTERIOR CERVICAL DECOMPRESSION/DISCECTOMY FUSION CERVICALTHREE-FOUR CERVICAL,FOUR-FIVE,CERVICAL FIVE-SIX,CERVICAL SIX-SEVEN.;  Surgeon: [name], MD;  Location: [facility];  Service: Neurosurgery;  Laterality: N/A;  ANTERIOR  . BACK SURGERY    . CATARACT EXTRACTION W/PHACO Right 03/31/2018   Procedure: CATARACT EXTRACTION PHACO AND INTRAOCULAR LENS PLACEMENT ([place]);  Surgeon: [name], MD;  Location: [facility];  Service: Ophthalmology;  Laterality: Right;  [place] 00:39.7 CDE 4.35 Fluid Pack Lot # [id number]  . CHOLECYSTECTOMY  1995  . COLONOSCOPY WITH PROPOFOL N/A 06/07/2017   Procedure: COLONOSCOPY WITH PROPOFOL;  Surgeon: [name], MD;  Location[facility] ENDOSCOPY;  Service: Endoscopy;  Laterality: N/A;  . ESOPHAGOGASTRODUODENOSCOPY (EGD) WITH PROPOFOL N/A 06/07/2017   Procedure: ESOPHAGOGASTRODUODENOSCOPY (EGD) WITH PROPOFOL;  Surgeon: [name], MD;  Location: [facility] ENDOSCOPY;  Service: Endoscopy;  Laterality: N/A;  . REDUCTION MAMMAPLASTY  Bilateral 1994    Assessment & Plan Clinical Impression:  Meredith Roach is a 79 y.o.right handed female with a history of gout as well as pre-diabetes.Per chart review she lives with spouse in [place].Independent prior to admission. 1 level home. Husband can assist. Presented 08/26/2019 after ground-level fall with resulting quadriparesis. 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 CT/MRI of the brain no acute intracranial abnormality. Admission chemistries unremarkable except glucose 123, CK of 93. Patient underwent decompressive anterior cervical discectomy C3-4 4-5, C5-6 and C6-7 with anterior cervical arthrodesis 08/28/2019 per Dr. Sherley Bounds. Decadron protocol as indicated. Aspen cervical collar applied as directed. Therapy evaluations completed and patient was admitted for a comprehensive rehab program. Patient transferred to CIR on 08/31/2019 .    Patient currently requires total with basic self-care skills secondary to muscle paralysis, abnormal tone, unbalanced muscle activation and decreased coordination and decreased sitting balance, decreased standing balance and decreased postural control.  Prior to hospitalization, patient was fully independent.   Patient will benefit from skilled intervention to increase independence with basic self-care skills prior to discharge home with care partner.  Anticipate patient will require 24 hour supervision and moderate physical assestance and follow up home health.  OT - End of Session Activity Tolerance: Tolerates 10 - 20 min activity with multiple rests Endurance Deficit: Yes Endurance Deficit Description: frequent rest breaks during functional activity OT Assessment Rehab Potential (ACUTE ONLY): Good OT Barriers to Discharge: Decreased caregiver support OT Barriers to Discharge Comments: husband can not do lifting; daughters work full time and  can only help in the evening OT Patient demonstrates impairments in the  following area(s): Balance;Edema;Endurance;Motor;Sensory OT Basic ADL's Functional Problem(s): Eating;Grooming;Bathing;Dressing;Toileting OT Transfers Functional Problem(s): Toilet;Tub/Shower OT Additional Impairment(s): Fuctional Use of Upper Extremity OT Plan OT Intensity: Minimum of 1-2 x/day, 45 to 90 minutes OT Frequency: 5 out of 7 days OT Duration/Estimated Length of Stay: 28-30 days OT Treatment/Interventions: Balance/vestibular training;Discharge planning;Functional mobility training;DME/adaptive equipment instruction;Disease mangement/prevention;Neuromuscular re-education;Patient/family education;Psychosocial support;Therapeutic Activities;Splinting/orthotics;Skin care/wound managment;Self Care/advanced ADL retraining;Therapeutic Exercise;UE/LE Strength taining/ROM;UE/LE Coordination activities OT Self Feeding Anticipated Outcome(s): set up OT Basic Self-Care Anticipated Outcome(s): set up grooming; mod A bathing and UB dressing; max A LB dressing OT Toileting Anticipated Outcome(s): max A OT Bathroom Transfers Anticipated Outcome(s): mod A OT Recommendation Patient destination: Home Follow Up Recommendations: Home health OT Equipment Recommended: Tub/shower bench;3 in 1 bedside comode   Skilled Therapeutic Intervention Pt seen for initial evaluation and ADL training.  Reviewed role of OT and discussed pt's goals.    Pt's daughter was present for the entire session and was very helpful with assisting this therapist with mobilizing this patient.    Worked on LB bathing bed level with hand over hand A to move wash cloth to wash thighs.  Tried to have pt cross her legs in figure 4 but her hips are very tight and she has frequent extensor spasms.  Doffed pants and pt opted to stay in a hospital gown as no more therapy sessions today.  Moved to EOB with total A of 2, sitting with min statically but needed max  when trying to use hands to wash arms and abdomen with hand over hand A.  Donned gown from EOB with max A as she could actively lift her arms.  Moved back to supine with total a of 2 with A from daughter and then RN assisted this therapist with positioning in bed.  Provided pt with yellow foam block for finger flexion exercises.    Pt and family in agreement with POC. Pt resting in bed with alarm set and all needs met.  OT Evaluation Precautions/Restrictions  Precautions Precautions: Fall;Cervical Precaution Comments: reviewed cervical precautions Required Braces or Orthoses: Cervical Brace Cervical Brace: Hard collar Restrictions Weight Bearing Restrictions: No    Vital Signs Therapy Vitals Temp: 97.8 F (36.6 C) Temp Source: Oral Pulse Rate: 61 Resp: 16 BP: 130/66 Patient Position (if appropriate): Lying Oxygen Therapy SpO2: 98 % O2 Device: Room Air Pain Pain Assessment Pain Score: 0-No pain Home Living/Prior Functioning Home Living Family/patient expects to be discharged to:: Private residence Living Arrangements: Spouse/significant other Available Help at Discharge: Family, Available PRN/intermittently Type of Home: House Home Access: Stairs to enter [employer] of Steps: 3 @ back, 4 @ front Entrance Stairs-Rails: Can reach both Home Layout: One level Bathroom Shower/Tub: Chiropodist: Handicapped height Additional Comments: pt's husband can provide setup A/Supervision, cannot provide much physical assist; daughters work full time available in the evening  Lives With: Spouse Prior Function Level of Independence: Independent with gait, Independent with transfers, Independent with basic ADLs, Independent with homemaking with ambulation  Able to Take Stairs?: Yes Driving: Yes Vocation: Retired Leisure: Hobbies-yes (Comment) Comments: loved gardening; Retired from school [employer]  ADL ADL Eating: Minimal  assistance Grooming: Moderate assistance Upper Body Bathing: Maximal assistance(with hand over hand A to hold washcloth) Lower Body Bathing: Dependent Upper Body Dressing: Dependent Where Assessed-Upper Body Dressing: Edge of bed Lower Body Dressing: Dependent Where Assessed-Lower Body Dressing: Bed level Toilet Transfer: Unable to assess Tub/Shower Transfer Method: Unable to assess Vision Baseline Vision/History: Wears glasses Wears  Glasses: Reading only Patient Visual Report: No change from baseline Vision Assessment?: No apparent visual deficits Perception  Perception: Within Functional Limits Praxis Praxis: Intact Cognition Overall Cognitive Status: Within Functional Limits for tasks assessed Arousal/Alertness: Awake/alert Orientation Level: Person;Place;Situation Person: Oriented Place: Oriented Situation: Oriented Year: 2021 Month: May Day of Week: Correct Memory: Appears intact Immediate Memory Recall: Sock;Blue;Bed Memory Recall Sock: Without Cue Memory Recall Blue: Without Cue Memory Recall Bed: Without Cue Attention: Focused Focused Attention: Appears intact Awareness: Appears intact Problem Solving: Appears intact Safety/Judgment: Appears intact Sensation Sensation Light Touch: Impaired Detail Peripheral sensation comments: pt reports "numbness from elbows to fingers" for the last several years, but it is much worse after her fall Light Touch Impaired Details: Impaired RUE;Impaired LUE Hot/Cold: Impaired by gross assessment Proprioception: Impaired by gross assessment Stereognosis: Impaired by gross assessment Additional Comments: no active finger isolation; R hand is cool to the touch, L hand warm to the touch Coordination Gross Motor Movements are Fluid and Coordinated: No Fine Motor Movements are Fluid and Coordinated: No Coordination and Movement Description: 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 Finger Nose Finger Test: unable to complete due to  impaired AROM Motor  Motor Motor: Tetraplegia;Abnormal tone;Abnormal postural alignment and control Motor - Skilled Clinical Observations: incomplete tetraplegia Mobility  Bed Mobility Bed Mobility: Rolling Right;Rolling Left;Supine to Sit;Sit to Supine Rolling Right: Moderate Assistance - Patient 50-74% Rolling Left: Moderate Assistance - Patient 50-74% Supine to Sit: 2 Helpers Sit to Supine: 2 Helpers Transfers Sit to Stand: 2 Helpers  Trunk/Postural Assessment  Cervical Assessment Cervical Assessment: Exceptions to WFL(cervical precautions) Thoracic Assessment Thoracic Assessment: Exceptions to WFL(rounded shoulders) Lumbar Assessment Lumbar Assessment: Exceptions to WFL(posterior pelvic tilt) Postural Control Postural Control: Deficits on evaluation(delayed; insufficient) Trunk Control: severely impaired due to weakness; she has a posterior lean and needs min A to hold static sit EOB; total A dynamic sit  Balance Balance Balance Assessed: Yes Static Sitting Balance Static Sitting - Balance Support: Bilateral upper extremity supported;Feet supported Static Sitting - Level of Assistance: 4: Min assist Dynamic Sitting Balance Dynamic Sitting - Balance Support: No upper extremity supported;Feet supported;During functional activity Dynamic Sitting - Level of Assistance: 2: Max Insurance risk surveyor Standing - Balance Support: Bilateral upper extremity supported;During functional activity Static Standing - Level of Assistance: 1: +2 Total assist Extremity/Trunk Assessment RUE Assessment RUE Assessment: Exceptions to [facility] RUE AROM (degrees) Right Shoulder Flexion: 160 Degrees Right Wrist Extension: 25 Degrees Right Composite Finger Extension: (0%) Right Composite Finger Flexion: 25% RUE PROM (degrees) Right Shoulder Flexion: 160 Degrees RUE Strength Right Shoulder Flexion: 3/5 Right Elbow Flexion: 4-/5 Right Elbow Extension: 3+/5 Right Hand Gross Grasp:  Impaired(no active finger extension,  flexors tight with only trace active finger flexion,  difficult to achieve full finger flexion with PROM due to slight edema and cold hand temperature) LUE Assessment LUE Assessment: Exceptions to WFL LUE AROM (degrees) Left Shoulder Flexion: 180 Degrees Left Composite Finger Extension: (0%) Left Composite Finger Flexion: (10%) LUE PROM (degrees) Left Elbow Flexion: 110 Left Elbow Extension: -10 LUE Strength Left Elbow Flexion: 4/5 Left Elbow Extension: 3-/5     Refer to Care Plan for Long Term Goals  Recommendations for other services: None    Discharge Criteria: Patient will be discharged from OT if patient refuses treatment 3 consecutive times without medical reason, if treatment goals not met, if there is a change in medical status, if patient makes no progress towards goals or if patient is discharged from hospital.  The  above assessment, treatment plan, treatment alternatives and goals were discussed and mutually agreed upon: by patient and by family  Yakir Wenke 09/01/2019, 3:43 PM

## 2019-09-01 NOTE — Progress Notes (Signed)
Inpatient Rehabilitation  Patient information reviewed and entered into eRehab system by Edrick Whitehorn M. Alben Jepsen, M.A., CCC/SLP, PPS Coordinator.  Information including medical coding, functional ability and quality indicators will be reviewed and updated through discharge.    

## 2019-09-01 NOTE — Progress Notes (Signed)
Dig stem protocol completed. Patient tolerated well. Patient had soft stool noted but unable to push any out. Writer unable to remove very much due to the stool being so soft. Patient denied pain or discomfort during the process.

## 2019-09-02 ENCOUNTER — Inpatient Hospital Stay (HOSPITAL_COMMUNITY): Payer: Medicare PPO | Admitting: Occupational Therapy

## 2019-09-02 ENCOUNTER — Inpatient Hospital Stay (HOSPITAL_COMMUNITY): Payer: Medicare PPO | Admitting: Physical Therapy

## 2019-09-02 ENCOUNTER — Inpatient Hospital Stay (HOSPITAL_COMMUNITY): Payer: Medicare PPO | Admitting: Speech Pathology

## 2019-09-02 LAB — GLUCOSE, CAPILLARY: Glucose-Capillary: 124 mg/dL — ABNORMAL HIGH (ref 70–99)

## 2019-09-02 NOTE — Progress Notes (Signed)
Speech Language Pathology Daily Session Note  Patient Details  Name: BLONDINA TOMAYKO MRN: [id number] Date of Birth: 05-02-1940  Today's Date: 09/02/2019 SLP Individual Time: [id number] SLP Individual Time Calculation (min): 40 min  Short Term Goals: Week 1: SLP Short Term Goal 1 (Week 1): Patient will consume current diet with minimal overt s/s of aspiration with Mod I for use of swallowing compensatory strategies. SLP Short Term Goal 2 (Week 1): Patient will consume trials of Dys. 3 textures without overt s/s of aspiration over 2 sessions with Mod I prior to upgrade.  Skilled Therapeutic Interventions: Skilled treatment session focused on dysphagia goals. SLP facilitated session by providing tray set-up with breakfast meal of Dys. 2 textures with thin liquids. Patient self-feed 100% of the meal but required assistance with liquids. Patient consumed meal with one overt cough with the sausage, suspect due to bolus size and dry texture. Coughing episodes were eliminated when patient utilized small bites and liquid washes to help clear suspect pharyngeal residuals. Recommend patient continue current diet. Patient left upright in bed with alarm on and all needs within reach. Continue with current plan of care.      Pain No/Denies Pain   Therapy/Group: Individual Therapy  Douglas Rooks 09/02/2019, 7:35 AM

## 2019-09-02 NOTE — Progress Notes (Signed)
PHYSICAL MEDICINE & REHABILITATION PROGRESS NOTE   Subjective/Complaints:   Pt reports bowel program went well- had BM Also had a good BM this AM Fed self her entire breakfast except water/fluids.   Very excited about improvement in strength/endurance in hands.   ROS:  Pt denies SOB, abd pain, CP, N/V/C/D, and vision changes  Objective:   VAS [place] LOWER EXTREMITY VENOUS (DVT)  Result Date: 09/01/2019  Lower Venous DVT Study Indications: Swelling.  Risk Factors: Trauma. Limitations: Immobility, patient positioning. Comparison Study: No prior studies. Performing Technologist: Oliver Hum RVT  Examination Guidelines: A complete evaluation includes B-mode imaging, spectral Doppler, color Doppler, and power Doppler as needed of all accessible portions of each vessel. Bilateral testing is considered an integral part of a complete examination. Limited examinations for reoccurring indications may be performed as noted. The reflux portion of the exam is performed with the patient in reverse Trendelenburg.  +---------+---------------+---------+-----------+----------+--------------+ RIGHT    CompressibilityPhasicitySpontaneityPropertiesThrombus Aging +---------+---------------+---------+-----------+----------+--------------+ CFV      Full           Yes      Yes                                 +---------+---------------+---------+-----------+----------+--------------+ SFJ      Full                                                        +---------+---------------+---------+-----------+----------+--------------+ FV Prox  Full                                                        +---------+---------------+---------+-----------+----------+--------------+ FV Mid   Full                                                        +---------+---------------+---------+-----------+----------+--------------+ FV DistalFull                                                         +---------+---------------+---------+-----------+----------+--------------+ PFV      Full                                                        +---------+---------------+---------+-----------+----------+--------------+ POP      Full           Yes      Yes                                 +---------+---------------+---------+-----------+----------+--------------+ PTV      Full                                                        +---------+---------------+---------+-----------+----------+--------------+  PERO     Full                                                        +---------+---------------+---------+-----------+----------+--------------+   +---------+---------------+---------+-----------+----------+--------------+ LEFT     CompressibilityPhasicitySpontaneityPropertiesThrombus Aging +---------+---------------+---------+-----------+----------+--------------+ CFV      Full           Yes      Yes                                 +---------+---------------+---------+-----------+----------+--------------+ SFJ      Full                                                        +---------+---------------+---------+-----------+----------+--------------+ FV Prox  Full                                                        +---------+---------------+---------+-----------+----------+--------------+ FV Mid   Full                                                        +---------+---------------+---------+-----------+----------+--------------+ FV DistalFull                                                        +---------+---------------+---------+-----------+----------+--------------+ PFV      Full                                                        +---------+---------------+---------+-----------+----------+--------------+ POP      Full           Yes      Yes                                  +---------+---------------+---------+-----------+----------+--------------+ PTV      Full                                                        +---------+---------------+---------+-----------+----------+--------------+ PERO     Full                                                        +---------+---------------+---------+-----------+----------+--------------+  Summary: RIGHT: - There is no evidence of deep vein thrombosis in the lower extremity.  - No cystic structure found in the popliteal fossa.  LEFT: - There is no evidence of deep vein thrombosis in the lower extremity.  - No cystic structure found in the popliteal fossa.  *See table(s) above for measurements and observations. Electronically signed by Servando Snare MD on 09/01/2019 at 3:01:33 PM.    Final    Recent Labs    09/01/19 0713  WBC 10.4  HGB 14.0  HCT 42.1  PLT 258   Recent Labs    09/01/19 0713  NA 143  K 4.7  CL 105  CO2 29  GLUCOSE 144*  BUN 18  CREATININE 0.88  CALCIUM 9.1    Intake/Output Summary (Last 24 hours) at 09/02/2019 1511 Last data filed at 09/02/2019 0645 Gross per 24 hour  Intake 170 ml  Output 1450 ml  Net -1280 ml     Physical Exam: Vital Signs Blood pressure (!) 138/58, pulse 60, temperature 98.1 F (36.7 C), resp. rate 17, height 5\' 3"  (1.6 m), weight 83.3 kg, SpO2 100 %.  Physical Exam Nursing noteand vitalsreviewed. Constitutional: awake, sitting up in bed; other daughter in room; appropriate, NAD HENT: conjugate gaze  No drainage of incision Cardiovascular:RRR Respiratory:CTA B/L [id number], NT, ND, (+)BS  Musculoskeletal:  Comments: RUE- biceps 4/5, WE 3/5, triceps 4/5, grip and finger abd 0/5 LUE- biceps 4/5, WE 3/5, triceps 3/5, grip and finger abd 0/5 UE movement is improved in hands- ~ 2/5 in grip B/L RLE- HF 2+/5, KE 4-/5, DF 1/5, PF 3/5, EHL 0/5 LLE- HF 2/5, KE 3+/5, DF 0/5, PF 3-/5, EHL 1/5 Neurological: Ox3 C4 intact; C5 intact on R; decreased  on L; C6-T1 decreased B/L, but absent on L T2-T12- decreased B/L- worse on L L1-L3 nml on R- decreased on L; L4-S1 R nml; L decreased; S3-S5 decreased B/L Has Hoffman's B/L; no clonus Significant, severe extensor tone with simple ROM- no increased tone at rest Skin: Skin iswarmand dry. 1 IV L hand- looks good- no infiltrate No issues on heels or bottom- skin intact Psychiatric:bright affect     Assessment/Plan: 1. 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  Physiatrist is providing close team supervision and 24 hour management of active medical problems listed below.  Physiatrist and rehab team continue to assess barriers to discharge/monitor patient progress toward functional and medical goals  Care Tool:  Bathing        Body parts bathed by helper: Right arm, Left arm, Chest, Abdomen, Front perineal area, Buttocks, Right upper leg, Left upper leg, Right lower leg, Left lower leg, Face     Bathing assist Assist Level: Total Assistance - Patient < 25%     Upper Body Dressing/Undressing Upper body dressing   What is the patient wearing?: Hospital gown only    Upper body assist Assist Level: Dependent - Patient 0%    Lower Body Dressing/Undressing Lower body dressing      What is the patient wearing?: Incontinence brief     Lower body assist Assist for lower body dressing: Dependent - Patient 0%     Toileting Toileting Toileting Activity did not occur (Clothing management and hygiene only): N/A (no void or bm)(pt is incontinent, not safe to sit on a BSC due to poor trunk control)  Toileting assist       Transfers Chair/bed  transfer  Transfers assist     Chair/bed transfer assist level: Dependent - mechanical lift(stedy)     Locomotion Ambulation   Ambulation assist   Ambulation activity did not occur: Safety/medical concerns           Walk 10 feet activity   Assist  Walk 10 feet activity did not occur: Safety/medical concerns        Walk 50 feet activity   Assist Walk 50 feet with 2 turns activity did not occur: Safety/medical concerns         Walk 150 feet activity   Assist Walk 150 feet activity did not occur: Safety/medical concerns         Walk 10 feet on uneven surface  activity   Assist Walk 10 feet on uneven surfaces activity did not occur: Safety/medical concerns         Wheelchair     Assist Will patient use wheelchair at discharge?: Yes Type of Wheelchair: (TBD)    Wheelchair assist level: Dependent - Patient 0% Max wheelchair distance: 150'    Wheelchair 50 feet with 2 turns activity    Assist        Assist Level: Dependent - Patient 0%   Wheelchair 150 feet activity     Assist      Assist Level: Dependent - Patient 0%   Blood pressure (!) 138/58, pulse 60, temperature 98.1 F (36.7 C), resp. rate 17, height 5\' 3"  (1.6 m), weight 83.3 kg, SpO2 100 %.  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 Status post ACDF 08/28/2019. Decadron taper. Cervical collar as directed. -need to order PRAFOs to prevent Ankle contractures -still on Decadron taper -patient may shower  5/22- improvement daily bodes well- explained that pt to /daughter -ELOS/Goals: 4 weeks- goals min-mod assist 2. Antithrombotics: -DVT/anticoagulation:SCDs. Check vascular study and check with [facility] to see if can start Lovenox 5/21- Dopplers (-) for DVT- need ot see when can start Lovenox -antiplatelet therapy: N/A 3. Pain Management:Baclofen 5 mg 3 times daily- will likely need to increase, oxycodone as needed  5/22- not complaining of pain at all- might need to titrate baclofen after a few days.  4. Mood:Provide emotional  support -antipsychotic agents: N/A 5. Neuropsych: This patientiscapable of making decisions on herown behalf. 6. Skin/Wound Care:Routine skin checksand turn while in bed every 2 hours while awake- keep on sides at night. 7. Fluids/Electrolytes/Nutrition:Routine in and outs with follow-up chemistries 8. Neurogenic bowel and bladder. con't foley until get her bowels cleaned out- can remove foley IF pt wants to d/c, and is able to do on own at home- if not, might need foley long term;. [place] program. Provide education.  5/21- ordered bowel program and explained to pt and daughter in layman's terms- will con't foley for now. Will order Lidocaine jelly for bowel program  5/22- good results in last 24 hours 9. GERD. Protonix 10. Hx of prediabetes- check an A1c and monitor as required.   5/21- can't find order- reordered for Monday- CBG 140s on BMP- since on decadron- if goes higher, will monitor more regularly.  11. Hx of gout- this acute event could be a trigger for gout- will monitor.     LOS: 2 days A FACE TO FACE EVALUATION WAS PERFORMED  Megan Lovorn 09/02/2019, 3:11 PM

## 2019-09-02 NOTE — Progress Notes (Signed)
Physical Therapy Session Note  Patient Details  Name: Meredith Roach MRN: [id number] Date of Birth: [birth year]  Today's Date: 09/02/2019 PT Individual Time: 1130-1215 and 7408-1448 PT Individual Time Calculation (min): 45 min and 35 min    Short Term Goals: Week 1:  PT Short Term Goal 1 (Week 1): Pt will perform bed mobility with assist x 1 PT Short Term Goal 2 (Week 1): Pt will perform least restrictive transfer with assist x 1 PT Short Term Goal 3 (Week 1): Pt will initiate gait training as safe and able  Skilled Therapeutic Interventions/Progress Updates:  Session 1  Pt received sitting in WC and agreeable to PT. Pt transported to rehab gym in [facility]. PT applied knee high Teds. LAQ 2 x 12 BLE. Pt able to raise through full range against gravity. Sit<>stand from [facility] in steady with mod assist x 3. Standing tolerance 2x 1 min and 45 seconds. Min-mod assist /cues for improved posture and knee extension; mild cervical discomfort reported in standing. PT instructed pt BUE AAROM for triceps extension, shoulder flexion, 2 x 10 with 1# bar weight. Patient returned to room and left sitting in [facility] with call bell in reach and all needs met.     Session 2  Pt received sitting in WC and agreeable to PT. Pt transported to day room in [facility] WC. kinetron reciprocal movement training 30cm/sec 4 x 1 min with 1.5 min rest break between bouts. Cues for symmetry and full ROM R and L. Pt returned to room and performed steady transfer to bed with min assist. Sit>supine completed with total A for LE and trunk control, and left supine in bed with call bell in reach and all needs met.        Therapy Documentation Precautions:  Precautions Precautions: Fall, Cervical Precaution Comments: reviewed cervical precautions Required Braces or Orthoses: Cervical Brace Cervical Brace: Hard collar Restrictions Weight Bearing Restrictions: No Pain:   denies at rest    Therapy/Group: Individual Therapy  Lorie Phenix 09/02/2019, 12:17 PM

## 2019-09-02 NOTE — Progress Notes (Signed)
Dayshift started bowel program, performed dig stim and inserted suppository.  @2030 , pt had a brown smear. 2 dig stims were performed. Stool was felt inside her rectal vault, and was evacuated.  @2300 , pt had a medium brown type 6 bowel movement. Dig stim was performed and pt's rectal vault appeared to be empty.  @0600 , pt had a small brown type 6 bowel movement.

## 2019-09-02 NOTE — Progress Notes (Signed)
Occupational Therapy Session Note  Patient Details  Name: Meredith Roach MRN: [id number] Date of Birth: [birth year]  Today's Date: 09/02/2019 OT Individual Time: 0920-1035 OT Individual Time Calculation (min): 75 min    Short Term Goals: Week 1:  OT Short Term Goal 1 (Week 1): Pt will be able to maintain static sit EOB with close S to engage more actively in UB self care. OT Short Term Goal 2 (Week 1): Pt will demonstrate improved R hand function to hold wash cloth in hand to wash thighs. OT Short Term Goal 3 (Week 1): Pt will be able to actively use hands to pull sleeves over arms. OT Short Term Goal 4 (Week 1): Pt will be able to rise to stand in [place] lift with mod A to work towards completing [facility] transfers.  Skilled Therapeutic Interventions/Progress Updates: Nurse dtr preesent and assististed mom and clinician for sessoin and husband came in ast last 7 minutes.   Focus and particiaption as follows:  Lower body dressing with head of bed elevated and ciricle sitting and lateral leans and with Hand over hand assist  Upper body dressing in bed with hand over hand assist and total assist x2   Bilateral shoulder AAROM and stretcyhing of lats and shoulder girdle muslces with patient supine and side lying in her bed  Supine head of bed elevated to edge of bed transfer=Total A x2 (dtr helped) Sit tostand in stedy= max A x2 sted to tilting w/c= total A x2 W/c positioning= total assist x2 to scoot back in w/c  Patient was left in chair with dtr and husband present.   Family was reminded to inform nursing staff before they leave and to make sure [name]'s call bell is secure with tapping reach.  Patient with very limited use of hands and legs and will benefit from many more OT oppportunities before having to discharge this facility.  She is motivated and wants to get to higher self care level to decrease caregiver burder and to return to gardening an dother leisure, mobiliity and self  care activities.  Continue OT Plan of care     Therapy Documentation Precautions:  Precautions Precautions: Fall, Cervical Precaution Comments: reviewed cervical precautions Required Braces or Orthoses: Cervical Brace Cervical Brace: Hard collar Restrictions Weight Bearing Restrictions: No   Pain:denied   Therapy/Group: Individual Therapy  Alfredia Ferguson [facility] 09/02/2019, 3:11 PM

## 2019-09-03 MED ORDER — SENNA 8.6 MG PO TABS
2.0000 | ORAL_TABLET | Freq: Every day | ORAL | Status: DC
  Administered 2019-09-04 – 2019-09-29 (×26): 17.2 mg via ORAL
  Filled 2019-09-03 (×26): qty 2

## 2019-09-03 NOTE — Progress Notes (Signed)
Dayshift started bowel program. Dig stim performed and suppository administered. No results at the moment.  @2010 , no results. Dig stim performed and rectal vault appeared empty.   @2320 , pt had brown smear. Dig stim performed and small amount of stool was evacuated from rectum.  @0550 , pt had type 6, brown, medium.

## 2019-09-03 NOTE — IPOC Note (Signed)
Overall Plan of Care [facility]) Patient Details Name: Meredith Roach MRN: [id number] DOB: December 11, 1940  Admitting Diagnosis: Quadriplegia [facility])  Hospital Problems: Principal Problem:   Quadriplegia ([place]) Active Problems:   S/P cervical spinal fusion     Functional Problem List: Nursing Bladder, Bowel, Endurance, Medication Management, Motor, Pain, Safety, Sensory  PT Balance, Endurance, Motor, Safety, Sensory  OT Balance, Edema, Endurance, Motor, Sensory  SLP Nutrition  TR         Basic ADL's: OT Eating, Grooming, Bathing, Dressing, Toileting     Advanced  ADL's: OT       Transfers: PT Bed Mobility, Bed to Chair, Car, Furniture, Floor  OT Toilet, Metallurgist: PT Ambulation, Emergency planning/management officer, Stairs     Additional Impairments: OT Fuctional Use of Upper Extremity  SLP Swallowing      TR      Anticipated Outcomes Item Anticipated Outcome  Self Feeding set up  Swallowing  Mod I   Basic self-care  set up grooming; mod A bathing and UB dressing; max A LB dressing  Toileting  max A   Bathroom Transfers mod A  Bowel/Bladder  Min assist of 1  Transfers  min A  Locomotion  min A to mod A overall with LRAD  Communication     Cognition     Pain  pain of 3 on pain scale of 0-10  Safety/Judgment      Therapy Plan: PT Intensity: Minimum of 1-2 x/day ,45 to 90 minutes PT Frequency: 5 out of 7 days PT Duration Estimated Length of Stay: 4 weeks OT Intensity: Minimum of 1-2 x/day, 45 to 90 minutes OT Frequency: 5 out of 7 days OT Duration/Estimated Length of Stay: 28-30 days SLP Intensity: Minumum of 1-2 x/day, 30 to 90 minutes SLP Frequency: 1 to 3 out of 7 days SLP Duration/Estimated Length of Stay: 7-10 days for SLP   Due to the current state of emergency, patients may not be receiving their 3-hours of Medicare-mandated therapy.   Team Interventions: Nursing Interventions Patient/Family Education, Disease Management/Prevention, Discharge  Planning, Bladder Management, Pain Management, Bowel Management, Medication Management  PT interventions Ambulation/gait training, Training and development officer, Community reintegration, Discharge planning, Disease management/prevention, DME/adaptive equipment instruction, Functional electrical stimulation, Functional mobility training, Neuromuscular re-education, Pain management, Patient/family education, Psychosocial support, Splinting/orthotics, Stair training, Therapeutic Activities, Therapeutic Exercise, UE/LE Strength taining/ROM, UE/LE Coordination activities, Wheelchair propulsion/positioning  OT Interventions Balance/vestibular training, Discharge planning, Functional mobility training, DME/adaptive equipment instruction, Disease mangement/prevention, Neuromuscular re-education, Patient/family education, Psychosocial support, Therapeutic Activities, Splinting/orthotics, Skin care/wound managment, Self Care/advanced ADL retraining, Therapeutic Exercise, UE/LE Strength taining/ROM, UE/LE Coordination activities  SLP Interventions Dysphagia/aspiration precaution training, Cueing hierarchy, Patient/family education, Functional tasks, Therapeutic Activities  TR Interventions    SW/CM Interventions Discharge Planning, Psychosocial Support, Patient/Family Education   Barriers to Discharge MD  Medical stability, Home enviroment access/loayout, Incontinence, Neurogenic bowel and bladder, Wound care, Lack of/limited family support, Weight bearing restrictions and C3 ASIA C quadriplegia  Nursing Pending surgery stairs leading into home  PT Decreased caregiver support, Medical stability, Home environment access/layout, Incontinence, Neurogenic Bowel & Bladder husband cannot provide much physical assist, pt does not have 24/7 support otherwise  OT Decreased caregiver support husband can not do lifting; daughters work full time and can only help in the evening  SLP      SW       Team Discharge  Planning: Destination: PT-Home ,OT- Home , SLP-Home Projected Follow-up: PT-Home health PT, 24 hour supervision/assistance, OT-  Home health OT, SLP-None Projected Equipment Needs: PT-To be determined, OT- Tub/shower bench, 3 in 1 bedside comode, SLP-None recommended by SLP Equipment Details: PT-TBD pending progress, OT-  Patient/family involved in discharge planning: PT- Patient, Family member/caregiver,  OT-Patient, Family member/caregiver, SLP-Patient, Family member/caregiver  MD ELOS: 28-30 days Medical Rehab Prognosis:  Good Assessment: Pt is a 79 yr old female with C3 ASIA C quadriplegia s/p C3-6 ACDF with neurogenic bowel and bladder, spasticity and hx of prediabetes- trying to get A1c- of note, getting strength back in hands a little- since increased risk of DVT, waiting to here back from [facility] when can start Lovenox.    Goals- min-mod assist since significantly impaired    See Team Conference Notes for weekly updates to the plan of care

## 2019-09-03 NOTE — Progress Notes (Signed)
[facility] PHYSICAL MEDICINE & REHABILITATION PROGRESS NOTE   Subjective/Complaints:  Pt reports working on hand strength with  Foam cubes.  Daughter at bedside.   Said using legs for pushing (Nustep?) 4x yesterday- 10 reps each time.  Had good BM with bowel program and this AM. .   ROS:  Pt denies SOB, abd pain, CP, N/V/C/D, and vision changes   Objective:   No results found. Recent Labs    09/01/19 0713  WBC 10.4  HGB 14.0  HCT 42.1  PLT 258   Recent Labs    09/01/19 0713  NA 143  K 4.7  CL 105  CO2 29  GLUCOSE 144*  BUN 18  CREATININE 0.88  CALCIUM 9.1    Intake/Output Summary (Last 24 hours) at 09/03/2019 1331 Last data filed at 09/03/2019 1209 Gross per 24 hour  Intake -  Output 2625 ml  Net -2625 ml     Physical Exam: Vital Signs Blood pressure 138/64, pulse 66, temperature 98.3 F (36.8 C), temperature source Oral, resp. rate 17, height 5\' 3"  (1.6 m), weight 83.3 kg, SpO2 99 %.  Physical Exam Nursing noteand vitalsreviewed. Constitutional: awake, bright, sitting up in bed, working with foam cubes /hands, daughter at bedside, NAD HENT: conjugate gaze  Incision looks great- not wearing collar- needs to at this height of bed Cardiovascular:RRR Respiratory:CTA B/L- no W/R/R- good air movement [id number], NT, ND, (+)BS  Musculoskeletal:  Comments: RUE- biceps 4/5, WE 3/5, triceps 4/5, grip 2/5 now and finger abd trace LUE- biceps 4/5, WE 3/5, triceps 3/5, grip 2-/5 on L- finger abd 0/5 RLE- HF 2+/5, KE 4-/5, DF 1/5, PF 3/5, EHL 0/5 LLE- HF 2/5, KE 3+/5, DF 0/5, PF 3-/5, EHL 1/5 Neurological: Ox3 C4 intact; C5 intact on R; decreased on L; C6-T1 decreased B/L, but absent on L T2-T12- decreased B/L- worse on L L1-L3 nml on R- decreased on L; L4-S1 R nml; L decreased; S3-S5 decreased B/L Has Hoffman's B/L; no clonus Significant, severe extensor tone with simple ROM- no increased tone at rest Skin: Skin iswarmand dry. 1 IV L hand- looks  good- no infiltrate No issues on heels or bottom- skin intact Of note, R hand and feet very cold to touch- L hand is very warm Psychiatric:bright affect     Assessment/Plan: 1. 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  Physiatrist is providing close team supervision and 24 hour management of active medical problems listed below.  Physiatrist and rehab team continue to assess barriers to discharge/monitor patient progress toward functional and medical goals  Care Tool:  Bathing        Body parts bathed by helper: Right arm, Left arm, Chest, Abdomen, Front perineal area, Buttocks, Right upper leg, Left upper leg, Right lower leg, Left lower leg, Face     Bathing assist Assist Level: Total Assistance - Patient < 25%     Upper Body Dressing/Undressing Upper body dressing   What is the patient wearing?: Hospital gown only    Upper body assist Assist Level: Dependent - Patient 0%    Lower Body Dressing/Undressing Lower body dressing      What is the patient wearing?: Incontinence brief     Lower body assist Assist for lower body dressing: Dependent - Patient 0%     Toileting Toileting Toileting Activity did not occur (Clothing management and hygiene only): N/A (no void or bm)(pt is  incontinent, not safe to sit on a BSC due to poor trunk control)  Toileting assist       Transfers Chair/bed transfer  Transfers assist     Chair/bed transfer assist level: Dependent - mechanical lift(stedy)     Locomotion Ambulation   Ambulation assist   Ambulation activity did not occur: Safety/medical concerns          Walk 10 feet activity   Assist  Walk 10 feet activity did not occur: Safety/medical concerns        Walk 50 feet activity   Assist Walk 50 feet with 2 turns activity did not occur: Safety/medical concerns         Walk  150 feet activity   Assist Walk 150 feet activity did not occur: Safety/medical concerns         Walk 10 feet on uneven surface  activity   Assist Walk 10 feet on uneven surfaces activity did not occur: Safety/medical concerns         Wheelchair     Assist Will patient use wheelchair at discharge?: Yes Type of Wheelchair: (TBD)    Wheelchair assist level: Dependent - Patient 0% Max wheelchair distance: 150'    Wheelchair 50 feet with 2 turns activity    Assist        Assist Level: Dependent - Patient 0%   Wheelchair 150 feet activity     Assist      Assist Level: Dependent - Patient 0%   Blood pressure 138/64, pulse 66, temperature 98.3 F (36.8 C), temperature source Oral, resp. rate 17, height 5\' 3"  (1.6 m), weight 83.3 kg, SpO2 99 %.  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 Status post ACDF 08/28/2019. Decadron taper. Cervical collar as directed. -need to order PRAFOs to prevent Ankle contractures -still on Decadron taper -patient may shower  5/22- improvement daily bodes well- explained that pt to /daughter  5/23- will discuss foley vs in/out caths with pt/daughter today vs Tuesday;  -ELOS/Goals: 4 weeks- goals min-mod assist 2. Antithrombotics: -DVT/anticoagulation:SCDs. Check vascular study and check with [facility] to see if can start Lovenox 5/21- Dopplers (-) for DVT- need ot see when can start Lovenox 5/23- surgery was 5/17- hopefully [facility] will let us start 5/27? -antiplatelet therapy: N/A 3. Pain Management:Baclofen 5 mg 3 times daily- will likely need to increase, oxycodone as needed  5/22- not complaining of pain at all- might need to titrate baclofen after a few days.  4. Mood:Provide emotional support -antipsychotic agents: N/A 5. Neuropsych: This patientiscapable  of making decisions on herown behalf. 6. Skin/Wound Care:Routine skin checksand turn while in bed every 2 hours while awake- keep on sides at night. 7. Fluids/Electrolytes/Nutrition:Routine in and outs with follow-up chemistries  5/23- labs in AM 8. Neurogenic bowel and bladder. con't foley until get her bowels cleaned out- can remove foley IF pt wants to d/c, and is able to do on own at home- if not, might need foley long term;. [place] program. Provide education.  5/21- ordered bowel program and explained to pt and daughter in layman's terms- will con't foley for now. Will order Lidocaine jelly for bowel program  5/22- good results in last 24 hours  5/23- BM with bowel program, but also at 0600- will change senokot to 2 tabs daily and not BID- will d/w foley vs caths with pt/family either today or Tuesday 9. GERD. Protonix 10. Hx of prediabetes- check an A1c and  monitor as required.   5/21- can't find order- reordered for Monday- CBG 140s on BMP- since on decadron- if goes higher, will monitor more regularly.  5/23- labs in AM- ordered CBC/BMP weekly  11. Hx of gout- this acute event could be a trigger for gout- will monitor.     LOS: 3 days A FACE TO FACE EVALUATION WAS PERFORMED  Megan Lovorn 09/03/2019, 1:31 PM

## 2019-09-04 ENCOUNTER — Inpatient Hospital Stay (HOSPITAL_COMMUNITY): Payer: Medicare PPO | Admitting: Occupational Therapy

## 2019-09-04 ENCOUNTER — Inpatient Hospital Stay (HOSPITAL_COMMUNITY): Payer: Medicare PPO | Admitting: Speech Pathology

## 2019-09-04 ENCOUNTER — Inpatient Hospital Stay (HOSPITAL_COMMUNITY): Payer: Medicare PPO | Admitting: Physical Therapy

## 2019-09-04 LAB — CBC WITH DIFFERENTIAL/PLATELET
Abs Immature Granulocytes: 0.2 10*3/uL — ABNORMAL HIGH (ref 0.00–0.07)
Basophils Absolute: 0 10*3/uL (ref 0.0–0.1)
Basophils Relative: 0 %
Eosinophils Absolute: 0 10*3/uL (ref 0.0–0.5)
Eosinophils Relative: 0 %
HCT: 43.4 % (ref 36.0–46.0)
Hemoglobin: 14.6 g/dL (ref 12.0–15.0)
Immature Granulocytes: 2 %
Lymphocytes Relative: 8 %
Lymphs Abs: 1 10*3/uL (ref 0.7–4.0)
MCH: 28.6 pg (ref 26.0–34.0)
MCHC: 33.6 g/dL (ref 30.0–36.0)
MCV: 84.9 fL (ref 80.0–100.0)
Monocytes Absolute: 0.5 10*3/uL (ref 0.1–1.0)
Monocytes Relative: 4 %
Neutro Abs: 11.6 10*3/uL — ABNORMAL HIGH (ref 1.7–7.7)
Neutrophils Relative %: 86 %
Platelets: 272 10*3/uL (ref 150–400)
RBC: 5.11 MIL/uL (ref 3.87–5.11)
RDW: 12.5 % (ref 11.5–15.5)
WBC: 13.3 10*3/uL — ABNORMAL HIGH (ref 4.0–10.5)
nRBC: 0 % (ref 0.0–0.2)

## 2019-09-04 LAB — BASIC METABOLIC PANEL
Anion gap: 9 (ref 5–15)
BUN: 23 mg/dL (ref 8–23)
CO2: 24 mmol/L (ref 22–32)
Calcium: 8.7 mg/dL — ABNORMAL LOW (ref 8.9–10.3)
Chloride: 101 mmol/L (ref 98–111)
Creatinine, Ser: 0.74 mg/dL (ref 0.44–1.00)
GFR calc Af Amer: >60 mL/min (ref >60)
GFR calc non Af Amer: >60 mL/min (ref >60)
Glucose, Bld: 161 mg/dL — ABNORMAL HIGH (ref 70–99)
Potassium: 4.1 mmol/L (ref 3.5–5.1)
Sodium: 134 mmol/L — ABNORMAL LOW (ref 135–145)

## 2019-09-04 LAB — HEMOGLOBIN A1C
Hgb A1c MFr Bld: 6.3 % — ABNORMAL HIGH (ref 4.8–5.6)
Mean Plasma Glucose: 134.11 mg/dL

## 2019-09-04 NOTE — Plan of Care (Signed)
  Problem: Consults Goal: RH GENERAL PATIENT EDUCATION Description: See Patient Education module for education specifics. Outcome: Progressing Goal: Skin Care Protocol Initiated - if Braden Score 18 or less Description: N/A Outcome: Progressing   Problem: RH BOWEL ELIMINATION Goal: RH STG MANAGE BOWEL WITH ASSISTANCE Description: STG Manage Bowel with min I Assistance. Outcome: Progressing   Problem: RH BLADDER ELIMINATION Goal: RH STG MANAGE BLADDER WITH EQUIPMENT WITH ASSISTANCE Description: STG Manage Bladder With Equipment With Min I Assistance Outcome: Progressing   Problem: RH SKIN INTEGRITY Goal: RH STG SKIN FREE OF INFECTION/BREAKDOWN Description: Remain free of breakdown during admission Outcome: Progressing Goal: RH STG MAINTAIN SKIN INTEGRITY WITH ASSISTANCE Description: STG Maintain Skin Integrity With Min I Assistance. Outcome: Progressing Goal: RH STG ABLE TO PERFORM INCISION/WOUND CARE W/ASSISTANCE Description: STG Able To Perform Incision/Wound Care With Min I Assistance. Outcome: Progressing   Problem: RH SAFETY Goal: RH STG ADHERE TO SAFETY PRECAUTIONS W/ASSISTANCE/DEVICE Description: STG Adhere to Safety Precautions With Mod  Assistance Outcome: Progressing   Problem: RH PAIN MANAGEMENT Goal: RH STG PAIN MANAGED AT OR BELOW PT'S PAIN GOAL Description: Pain goal of 3 on pain scale of 0-10 Outcome: Progressing   

## 2019-09-04 NOTE — Progress Notes (Signed)
Inpatient Rehabilitation Center Individual Statement of Services  Patient Name:  Meredith Roach  Date:  09/04/2019  Welcome to the [facility].  Our goal is to provide you with an individualized program based on your diagnosis and situation, designed to meet your specific needs.  With this comprehensive rehabilitation program, you will be expected to participate in at least 3 hours of rehabilitation therapies Monday-Friday, with modified therapy programming on the weekends.  Your rehabilitation program will include the following services:  Physical Therapy (PT), Occupational Therapy (OT), Speech Therapy (ST), 24 hour per day rehabilitation nursing, Therapeutic Recreaction (TR), Psychology, Neuropsychology, Care Coordinator, Rehabilitation Medicine, Nutrition Services, Pharmacy Services and Other  Weekly team conferences will be held on Tuesdays to discuss your progress.  Your Inpatient Rehabilitation Care Coordinator will talk with you frequently to get your input and to update you on team discussions.  Team conferences with you and your family in attendance may also be held.  Expected length of stay: 4 weeks    Overall anticipated outcome: Moderate Assistance  Depending on your progress and recovery, your program may change. Your Inpatient Rehabilitation Care Coordinator will coordinate services and will keep you informed of any changes. Your Inpatient Rehabilitation Care Coordinator's name and contact numbers are listed  below.  The following services may also be recommended but are not provided by the [facility] will be made to provide these services after discharge if needed.  Arrangements include referral to agencies that provide these services.  Your insurance has been verified to be:  [employer]  Your primary doctor is:  Emily Filbert  Pertinent information will be shared with your doctor and your insurance company.  Inpatient Rehabilitation Care Coordinator:  Cathleen Corti [phone] or [phone]  Information discussed with and copy given to patient by: Rana Snare, 09/04/2019, 10:29 AM

## 2019-09-04 NOTE — Progress Notes (Signed)
Inpatient Rehabilitation Care Coordinator Assessment and Plan  Patient Details  Name: Meredith Roach MRN: [id number] Date of Birth: 1940-05-10  Today's Date: 09/04/2019  Problem List:  Patient Active Problem List   Diagnosis Date Noted  . Quadriplegia ([place]) 08/31/2019  . S/P cervical spinal fusion 08/28/2019  . 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 Radiculopathy 10/21/2016   Past Medical History:  Past Medical History:  Diagnosis Date  . Arthritis   . GERD (gastroesophageal reflux disease)   . Gout   . Neuromuscular disorder ([place])   . Pre-diabetes    Past Surgical History:  Past Surgical History:  Procedure Laterality Date  . ANTERIOR CERVICAL DECOMPRESSION/DISCECTOMY FUSION 4 LEVELS N/A 08/28/2019   Procedure: ANTERIOR CERVICAL DECOMPRESSION/DISCECTOMY FUSION CERVICALTHREE-FOUR CERVICAL,FOUR-FIVE,CERVICAL FIVE-SIX,CERVICAL SIX-SEVEN.;  Surgeon: [name], MD;  Location: [facility];  Service: Neurosurgery;  Laterality: N/A;  ANTERIOR  . BACK SURGERY    . CATARACT EXTRACTION W/PHACO Right 03/31/2018   Procedure: CATARACT EXTRACTION PHACO AND INTRAOCULAR LENS PLACEMENT ([place]);  Surgeon: [name], MD;  Location: [facility];  Service: Ophthalmology;  Laterality: Right;  [place] 00:39.7 CDE 4.35 Fluid Pack Lot # [id number]  . CHOLECYSTECTOMY  1995  . COLONOSCOPY WITH PROPOFOL N/A 06/07/2017   Procedure: COLONOSCOPY WITH PROPOFOL;  Surgeon: [name], MD;  [facility] END[facility];  Service: Endoscopy;  Laterality: N/A;  . ESOPHAGOGASTRODUODENOSCOPY (EGD) WITH PROPOFOL N/A 06/07/2017   Procedure: ESOPHAGOGASTRODUODENOSCOPY (EGD) WITH PROPOFOL;  Surgeon: [name], MD;  Location: [facility] ENDOSCOPY;  Service: Endoscopy;  Laterality: N/A;  . REDUCTION MAMMAPLASTY Bilateral 1994   Social History:  reports that she has never smoked. She has never used smokeless tobacco. She reports that she does not drink alcohol or use drugs.  Family /  Support Systems Marital Status: Married How Long?: 66 yrs Patient Roles: Spouse, Parent Spouse/Significant Other: [name] (husband) [phone] Children: 2 adult daughters: [name] #[phone]/EEFEOF [phone] Other Supports: Daughters PRN Anticipated Caregiver: Husband and daughters plan to alternate Ability/Limitations of Caregiver: None reported Caregiver Availability: 24/7 Family Dynamics: Pt lives with her husband  Social History Preferred language: English Religion: None Cultural Background: Pt is a retired Armed forces logistics/support/administrative officer for 13 years, and Public librarian for 16 yrs. Education: High school grad Read: Yes Write: Yes Employment Status: Retired Public relations account executive Issues: Denies Guardian/Conservator: N/A   Abuse/Neglect Abuse/Neglect Assessment Can Be Completed: Yes Physical Abuse: Denies Verbal Abuse: Denies Sexual Abuse: Denies Exploitation of patient/patient's resources: Denies Self-Neglect: Denies  Emotional Status Pt's affect, behavior and adjustment status: Pt in good spirits. Pt is adjusting to her current condition, but is remaining positive about progress that can be made Recent Psychosocial Issues: Denies Psychiatric History: Denies Substance Abuse History: Denies  Patient / Family Perceptions, Expectations & Goals Pt/Family understanding of illness & functional limitations: Pt has general understanding of her current condition Premorbid pt/family roles/activities: Independent Anticipated changes in roles/activities/participation: Assistance with ADLs/IADLs Pt/family expectations/goals: Pt goal is to work on "walking and helping myself as much as able."  [employer]: None Premorbid Home Care/DME Agencies: None Transportation available at discharge: Family Resource referrals recommended: Neuropsychology  Discharge Planning Living Arrangements: Spouse/significant other Support Systems: Spouse/significant  other, Children Type of Residence: Private residence Insurance underwriter Resources: Multimedia programmer (specify)([employer] Medicare) Financial Resources: SSI Financial Screen Referred: No Living Expenses: Own Money Management: Patient, Spouse Does the patient have any problems obtaining your medications?: No Care Coordinator Anticipated Follow Up Needs: HH/OP Expected length of  stay: 4 weeks  Clinical Impression SW met with pt in room to introduce self, explain role, and discuss discharge process. Pt is not a English as a second language teacher. Would like education on HCPOA. DME: RW, showerchair, and w/c (obtained in 2018 after back surgery).  SW called pt husband [name] [phone]) to introduce self, explain role, and discuss discharge process. Pt husband aware there will be follow-up with updates after team conference.   Flay Ghosh A Matthieu Loftus 09/04/2019, 2:17 PM

## 2019-09-04 NOTE — Progress Notes (Signed)
Physical Therapy Session Note  Patient Details  Name: Meredith Roach MRN: [id number] Date of Birth: Apr 02, 1941  Today's Date: 09/04/2019 PT Individual Time: 1000-1100 PT Individual Time Calculation (min): 60 min   Short Term Goals: Week 1:  PT Short Term Goal 1 (Week 1): Pt will perform bed mobility with assist x 1 PT Short Term Goal 2 (Week 1): Pt will perform least restrictive transfer with assist x 1 PT Short Term Goal 3 (Week 1): Pt will initiate gait training as safe and able  Skilled Therapeutic Interventions/Progress Updates:    Pt received seated in bed, agreeable to PT session. Pt reports some soreness in her hips from current positioning in bed, resolves with position change. Seated in bed to sitting EOB via log roll with assist x 2 for some LE management and trunk elevation. Sit to stand with min A x 2 to stedy. Stedy transfer to TIS w/c. Once seated in w/c pt is dependent to doff non-skid socks and don shoes. Dependent transport via TIS w/c to/from therapy gym. Sit to stand to stedy with min A x 2 for placement of standing frame sling. Sit to stand in standing frame. Session focus on BLE NMR via forced use in standing position with minimal assist from sling needed to maintain upright standing posture. Once in standing pt able to perform mini-squats, 4 x 10 reps with focus on eccentric control when squatting vs just sitting down in relaxed standing frame sling. Attempt alt LE lifts in standing, pt unable to fully lift LE up off the floor but demonstrates good lateral weight shift. Standing BLE heel raises 2 x 10 reps. Pt agreeable to stay seated in TIS w/c at end of session. Pt left semi-reclined in w/c with needs in reach, quick release belt and chair alarm in place at end of session.  Therapy Documentation Precautions:  Precautions Precautions: Fall, Cervical Precaution Comments: reviewed cervical precautions Required Braces or Orthoses: Cervical Brace Cervical Brace: Hard  collar Restrictions Weight Bearing Restrictions: No    Therapy/Group: Individual Therapy   Excell Seltzer, PT, DPT  09/04/2019, 11:54 AM

## 2019-09-04 NOTE — Progress Notes (Signed)
The chaplain responded to a consult for an AD. The chaplain provided the paperwork for the patient to review with family. The patient expressed their inability to sign, so if they decide to complete the paperwork they will require an agent.  Brion Aliment Chaplain Resident For questions concerning this note please contact me by pager [phone]

## 2019-09-04 NOTE — Progress Notes (Signed)
Bowel protocol started at [facility]. Digital stimulation presented nothing in chamber and patient presented with gas. Patient rectum was introduced to suppository at 1800 due to patient not presenting with bowel movement. Next shift reported to and will follow up with protocol.

## 2019-09-04 NOTE — Progress Notes (Signed)
Occupational Therapy Session Note  Patient Details  Name: Meredith Roach MRN: [id number] Date of Birth: May 18, 1940  Today's Date: 09/04/2019 OT Individual Time: 1347-1430 OT Individual Time Calculation (min): 43 min    Short Term Goals: Week 1:  OT Short Term Goal 1 (Week 1): Pt will be able to maintain static sit EOB with close S to engage more actively in UB self care. OT Short Term Goal 2 (Week 1): Pt will demonstrate improved R hand function to hold wash cloth in hand to wash thighs. OT Short Term Goal 3 (Week 1): Pt will be able to actively use hands to pull sleeves over arms. OT Short Term Goal 4 (Week 1): Pt will be able to rise to stand in [place] lift with mod A to work towards completing [facility] transfers.  Skilled Therapeutic Interventions/Progress Updates:    Pt greeted seated in TIS wc and agreeable to OT treatment session. Worked on UB there-ex using towel pushes on high-low table. Towel pushes using B UEs, then progressed to unilateral UE. Shoulder flex/ext, and shoulder adduction, abduction. L side is weaker than R. Cup stacking activity with OT guided assist initially to grasp cup with L hand, but overall much improved grasp on B UE's with repetition. Practiced stacking and unstacking in high repetitions. Pt returned to room and end of session and left seated in TIS wc for handoff to SLP for next theerpay   Therapy Documentation Precautions:  Precautions Precautions: Fall, Cervical Precaution Comments: reviewed cervical precautions Required Braces or Orthoses: Cervical Brace Cervical Brace: Hard collar Restrictions Weight Bearing Restrictions: No Pain:   Pt reports pressure in L hip from sitting up for a while. Repositioned for comfort.   Therapy/Group: Individual Therapy  Valma Cava 09/04/2019, 2:12 PM

## 2019-09-04 NOTE — Progress Notes (Signed)
[facility] PHYSICAL MEDICINE & REHABILITATION PROGRESS NOTE   Subjective/Complaints: No complaints. WBC 13.3 this morning, up from 10.4. Afebrile and asymptomatic.   ROS: Pt denies SOB, abd pain, CP, N/V/C/D, and vision changes   Objective:   No results found. Recent Labs    09/04/19 0945  WBC 13.3*  HGB 14.6  HCT 43.4  PLT 272   Recent Labs    09/04/19 0945  NA 134*  K 4.1  CL 101  CO2 24  GLUCOSE 161*  BUN 23  CREATININE 0.74  CALCIUM 8.7*    Intake/Output Summary (Last 24 hours) at 09/04/2019 1638 Last data filed at 09/04/2019 0800 Gross per 24 hour  Intake 120 ml  Output 1675 ml  Net -1555 ml     Physical Exam: Vital Signs Blood pressure 111/66, pulse 91, temperature 97.6 F (36.4 C), resp. rate 17, height 5\' 3"  (1.6 m), weight 83.3 kg, SpO2 99 %.  Physical Exam Nursing noteand vitalsreviewed. Constitutional: awake, bright, sitting up in bed HENT: conjugate gaze  Incision looks great- not wearing collar- needs to at this height of bed Cardiovascular:RRR Respiratory:CTA B/L- no W/R/R- good air movement [id number], NT, ND, (+)BS  Musculoskeletal:  Comments: RUE- biceps 4/5, WE 3/5, triceps 4/5, grip 2/5 now and finger abd trace LUE- biceps 4/5, WE 3/5, triceps 3/5, grip 2-/5 on L- finger abd 0/5 RLE- HF 2+/5, KE 4-/5, DF 1/5, PF 3/5, EHL 0/5 LLE- HF 2/5, KE 3+/5, DF 0/5, PF 3-/5, EHL 1/5 +Left hip tenderness Neurological: Ox3 C4 intact; C5 intact on R; decreased on L; C6-T1 decreased B/L, but absent on L T2-T12- decreased B/L- worse on L L1-L3 nml on R- decreased on L; L4-S1 R nml; L decreased; S3-S5 decreased B/L Has Hoffman's B/L; no clonus Significant, severe extensor tone with simple ROM- no increased tone at rest Skin: Skin iswarmand dry. 1 IV L hand- looks good- no infiltrate No issues on heels or bottom- skin intact Of note, R hand and feet very cold to touch- L hand is very warm Psychiatric:bright  affect  Assessment/Plan: 1. 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  Physiatrist is providing close team supervision and 24 hour management of active medical problems listed below.  Physiatrist and rehab team continue to assess barriers to discharge/monitor patient progress toward functional and medical goals  Care Tool:  Bathing        Body parts bathed by helper: Right arm, Left arm, Chest, Abdomen, Front perineal area, Buttocks, Right upper leg, Left upper leg, Right lower leg, Left lower leg, Face     Bathing assist Assist Level: Total Assistance - Patient < 25%     Upper Body Dressing/Undressing Upper body dressing   What is the patient wearing?: Hospital gown only    Upper body assist Assist Level: Dependent - Patient 0%    Lower Body Dressing/Undressing Lower body dressing      What is the patient wearing?: Pants     Lower body assist Assist for lower body dressing: Maximal Assistance - Patient 25 - 49%     Toileting Toileting Toileting Activity did not occur (Clothing management and hygiene only): (pt is incontinent, not safe to sit on a BSC due to poor trunk control)  Toileting assist Assist for toileting: Total Assistance - Patient < 25%     Transfers Chair/bed transfer  Transfers assist     Chair/bed transfer assist  level: Dependent - mechanical lift     Locomotion Ambulation   Ambulation assist   Ambulation activity did not occur: Safety/medical concerns          Walk 10 feet activity   Assist  Walk 10 feet activity did not occur: Safety/medical concerns        Walk 50 feet activity   Assist Walk 50 feet with 2 turns activity did not occur: Safety/medical concerns         Walk 150 feet activity   Assist Walk 150 feet activity did not occur: Safety/medical concerns         Walk 10 feet on uneven  surface  activity   Assist Walk 10 feet on uneven surfaces activity did not occur: Safety/medical concerns         Wheelchair     Assist Will patient use wheelchair at discharge?: Yes Type of Wheelchair: (TBD)    Wheelchair assist level: Dependent - Patient 0% Max wheelchair distance: 150'    Wheelchair 50 feet with 2 turns activity    Assist        Assist Level: Dependent - Patient 0%   Wheelchair 150 feet activity     Assist      Assist Level: Dependent - Patient 0%   Blood pressure 111/66, pulse 91, temperature 97.6 F (36.4 C), resp. rate 17, height 5\' 3"  (1.6 m), weight 83.3 kg, SpO2 99 %.  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 Status post ACDF 08/28/2019. Decadron taper. Cervical collar as directed. -need to order PRAFOs to prevent Ankle contractures -still on Decadron taper -patient may shower  5/22- improvement daily bodes well- explained that pt to /daughter  5/23- will discuss foley vs in/out caths with pt/daughter today vs Tuesday;  -ELOS/Goals: 4 weeks- goals min-mod assist  -Continue CIR 2. Antithrombotics: -DVT/anticoagulation:SCDs. Check vascular study and check with [facility] to see if can start Lovenox 5/21- Dopplers (-) for DVT- need ot see when can start Lovenox 5/23- surgery was 5/17- hopefully [facility] will let us start 5/27? -antiplatelet therapy: N/A 3. Pain Management:Baclofen 5 mg 3 times daily- will likely need to increase, oxycodone as needed  5/22- not complaining of pain at all- might need to titrate baclofen after a few days.   5/24: well controlled 4. Mood:Provide emotional support -antipsychotic agents: N/A 5. Neuropsych: This patientiscapable of making decisions on herown behalf. 6. Skin/Wound Care:Routine skin checksand turn while in bed every 2  hours while awake- keep on sides at night. 7. Fluids/Electrolytes/Nutrition:Routine in and outs with follow-up chemistries  5/23- labs in AM  5/24: BMP stable 8. Neurogenic bowel and bladder. con't foley until get her bowels cleaned out- can remove foley IF pt wants to d/c, and is able to do on own at home- if not, might need foley long term;. [place] program. Provide education.  5/21- ordered bowel program and explained to pt and daughter in layman's terms- will con't foley for now. Will order Lidocaine jelly for bowel program  5/22- good results in last 24 hours  5/23- BM with bowel program, but also at 0600- will change senokot to 2 tabs daily and not BID- will d/w foley vs caths with pt/family either today or Tuesday 9. GERD. Protonix 10. Hx of prediabetes- check an A1c and monitor as required.   5/21- can't find order- reordered for Monday- CBG 140s on BMP- since on decadron- if goes higher, will monitor more regularly.  5/23- labs in AM- ordered CBC/BMP weekly  11. Hx of gout- this acute event could be a trigger for gout- will monitor. 12. Leukocytosis:  5/24: WBC up to 13.3. Afebrile and asymptomatic. Draw UA/UC given indwelling catheter and trend tomorrow.     LOS: 4 days A FACE TO FACE EVALUATION WAS PERFORMED  Izora Ribas 09/04/2019, 4:38 PM

## 2019-09-04 NOTE — Progress Notes (Signed)
Speech Language Pathology Daily Session Note  Patient Details  Name: Meredith Roach MRN: [id number] Date of Birth: May 03, 1940  Today's Date: 09/04/2019 SLP Individual Time: 1431-1458 SLP Individual Time Calculation (min): 27 min  Short Term Goals: Week 1: SLP Short Term Goal 1 (Week 1): Patient will consume current diet with minimal overt s/s of aspiration with Mod I for use of swallowing compensatory strategies. SLP Short Term Goal 2 (Week 1): Patient will consume trials of Dys. 3 textures without overt s/s of aspiration over 2 sessions with Mod I prior to upgrade.  Skilled Therapeutic Interventions: Pt was seen for skilled ST targeting dysphagia goals. SLP facilitated session with upgraded trial of dysphagia 3 (mechanical soft) solid snack with thin liquids. Her mastication was fully functional, and able to self feed with Min A. She achieved full oral clearance, however required Supervision A verbal cues for use of swallow strategies. No overt s/sx aspiration observed across solids or liquids. After assisting RN with transferring pt back to bed (from chair), pt left laying comfortable with RN still present, needs met to her satisfaction. Continue per current plan of care.        Pain Pain Assessment Pain Scale: Faces Faces Pain Scale: Hurts a little bit Pain Type: Acute pain Pain Location: Hip Pain Descriptors / Indicators: Pressure Pain Onset: With Activity Pain Intervention(s): Repositioned Multiple Pain Sites: No  Therapy/Group: Individual Therapy  Arbutus Leas 09/04/2019, 7:27 AM

## 2019-09-04 NOTE — Progress Notes (Signed)
Occupational Therapy Session Note  Patient Details  Name: Meredith Roach MRN: [id number] Date of Birth: [birth year]  Today's Date: 09/04/2019 OT Individual Time: [phone] OT Individual Time Calculation (min): 58 min   Short Term Goals: Week 1:  OT Short Term Goal 1 (Week 1): Pt will be able to maintain static sit EOB with close S to engage more actively in UB self care. OT Short Term Goal 2 (Week 1): Pt will demonstrate improved R hand function to hold wash cloth in hand to wash thighs. OT Short Term Goal 3 (Week 1): Pt will be able to actively use hands to pull sleeves over arms. OT Short Term Goal 4 (Week 1): Pt will be able to rise to stand in Bowdle lift with mod A to work towards completing [facility] transfers.  Skilled Therapeutic Interventions/Progress Updates:    Pt greeted in bed with no c/o pain, waiting for her breakfast tray. OT retrieved meal tray from cart and then we donned cervical collar while pt was in supine before elevating HOB in prep for eating. OT also set up the built up utensil with wrist strap for the Rt hand to maximize her functional independence. Pt then consumed ~75% of her meal with supervision assistance. CGA-Min A for properly gripping her weighted mug with Lt hand while bilaterally including this limb for bringing beverages to mouth. Discussed increasing visual attendance to the Lt hand to improve positioning on her own due to sensation deficits. Pt requested OT to assist her with remaining 5% of meal that she ate due to arm fatigue. Min vcs for small bites due to spillage at times. No s/s aspiration during meal. Afterwards pt was lowered to supine position. She was able to elevate each LE against gravity to assist OT with donning her pants. Once OT positioned and stabilized both feet, pt able to activate core for bridging to help OT elevate pants over hips. Max A for rolling Rt>Lt to fully elevate pants. Total A for Teds and gripper socks. Pt reported brief was already  changed just prior to OT arrival by nursing staff. She remained comfortably in bed with all needs within reach, wearing c-collar, soft call bell within reach on chest, bed alarm set, and 4 bedrails up per request. Tx focus placed on NMR, ADL retraining, and adaptive eating skills.   Therapy Documentation Precautions:  Precautions Precautions: Fall, Cervical Precaution Comments: reviewed cervical precautions Required Braces or Orthoses: Cervical Brace Cervical Brace: Hard collar Restrictions Weight Bearing Restrictions: No ADL: ADL Eating: Minimal assistance Grooming: Moderate assistance Upper Body Bathing: Maximal assistance(with hand over hand A to hold washcloth) Lower Body Bathing: Dependent Upper Body Dressing: Dependent Where Assessed-Upper Body Dressing: Edge of bed Lower Body Dressing: Dependent Where Assessed-Lower Body Dressing: Bed level Toilet Transfer: Unable to assess Tub/Shower Transfer Method: Unable to assess      Therapy/Group: Individual Therapy  Nanda Bittick A Kaisey Huseby 09/04/2019, 12:47 PM

## 2019-09-05 ENCOUNTER — Encounter (HOSPITAL_COMMUNITY): Payer: Medicare PPO | Admitting: Psychology

## 2019-09-05 ENCOUNTER — Inpatient Hospital Stay (HOSPITAL_COMMUNITY): Payer: Medicare PPO

## 2019-09-05 ENCOUNTER — Inpatient Hospital Stay (HOSPITAL_COMMUNITY): Payer: Medicare PPO | Admitting: Occupational Therapy

## 2019-09-05 ENCOUNTER — Inpatient Hospital Stay (HOSPITAL_COMMUNITY): Payer: Medicare PPO | Admitting: Physical Therapy

## 2019-09-05 DIAGNOSIS — Z981 Arthrodesis status: Secondary | ICD-10-CM

## 2019-09-05 LAB — URINALYSIS, ROUTINE W REFLEX MICROSCOPIC
Bilirubin Urine: NEGATIVE
Glucose, UA: NEGATIVE mg/dL
Ketones, ur: NEGATIVE mg/dL
Nitrite: POSITIVE — AB
Protein, ur: NEGATIVE mg/dL
Specific Gravity, Urine: 1.014 (ref 1.005–1.030)
pH: 5 (ref 5.0–8.0)

## 2019-09-05 LAB — CBC WITH DIFFERENTIAL/PLATELET
Abs Immature Granulocytes: 0.32 10*3/uL — ABNORMAL HIGH (ref 0.00–0.07)
Basophils Absolute: 0 10*3/uL (ref 0.0–0.1)
Basophils Relative: 0 %
Eosinophils Absolute: 0 10*3/uL (ref 0.0–0.5)
Eosinophils Relative: 0 %
HCT: 45.8 % (ref 36.0–46.0)
Hemoglobin: 15.1 g/dL — ABNORMAL HIGH (ref 12.0–15.0)
Immature Granulocytes: 2 %
Lymphocytes Relative: 9 %
Lymphs Abs: 1.3 10*3/uL (ref 0.7–4.0)
MCH: 28.3 pg (ref 26.0–34.0)
MCHC: 33 g/dL (ref 30.0–36.0)
MCV: 85.8 fL (ref 80.0–100.0)
Monocytes Absolute: 0.6 10*3/uL (ref 0.1–1.0)
Monocytes Relative: 4 %
Neutro Abs: 12 10*3/uL — ABNORMAL HIGH (ref 1.7–7.7)
Neutrophils Relative %: 85 %
Platelets: 283 10*3/uL (ref 150–400)
RBC: 5.34 MIL/uL — ABNORMAL HIGH (ref 3.87–5.11)
RDW: 12.6 % (ref 11.5–15.5)
WBC: 14.2 10*3/uL — ABNORMAL HIGH (ref 4.0–10.5)
nRBC: 0 % (ref 0.0–0.2)

## 2019-09-05 MED ORDER — TAMSULOSIN HCL 0.4 MG PO CAPS
0.4000 mg | ORAL_CAPSULE | Freq: Every day | ORAL | Status: DC
  Administered 2019-09-05 – 2019-09-10 (×6): 0.4 mg via ORAL
  Filled 2019-09-05 (×6): qty 1

## 2019-09-05 MED ORDER — CEPHALEXIN 250 MG PO CAPS
500.0000 mg | ORAL_CAPSULE | Freq: Three times a day (TID) | ORAL | Status: DC
  Administered 2019-09-05 – 2019-09-07 (×6): 500 mg via ORAL
  Filled 2019-09-05 (×6): qty 2

## 2019-09-05 NOTE — Consult Note (Signed)
Neuropsychological Consultation   Patient:   Meredith Roach   DOB:   June 05, 1940  MR Number:  [id number]  Location:  [facility] [address] CENTER [address] [place] [id number] [place] [address] Dept: [place]: [phone]           Date of Service:   09/05/2019  Start Time:   3 PM End Time:   4 PM  Provider/Observer:  Ilean Skill, Psy.D.       Clinical Neuropsychologist       Billing Code/Service: [phone]  Chief Complaint:    Meredith Roach is a 79 year old female with history of gout as well as pre-diabetes.  Presented on 08/26/19 after ground level fall with resulting quadriparesis.  MRI showed severe spondylosis and severe spinal stenosis with cord compression from C3-C7 with signal change in spinal cord.  Patient underwent decompressive anterior cervical discectomy C3/4, C4/5, C5/6 and C6/7  With anterior cervical arthrodesis on 08/28/19.  Patient has had some times of anxiety with spasms but has improved recently during CIR program.  Reason for Service:  Patient referred for neuropsychological consultation due to coping and adjustment issues with earlier increase in anxiety.  Below is the HPI for the current admission.  [id number] Meredith Roach is a 79 y.o.right handed female with a history of gout as well as pre-diabetes.Per chart review she lives with spouse in [place].Independent prior to admission. 1 level home. Husband can assist. Presented 08/26/2019 after ground-level fall with resulting quadriparesis. 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 CT/MRI of the brain no acute intracranial abnormality. Admission chemistries unremarkable except glucose 123, CK of 93. Patient underwent decompressive anterior cervical discectomy C3-4 4-5, C5-6 and C6-7 with anterior cervical arthrodesis 08/28/2019 per Dr. Sherley Bounds.  Decadron protocol as indicated. Aspen cervical collar applied as directed. Therapy evaluations completed and patient was admitted for a comprehensive rehab program  Current Status:  Patient was in wheelchair with cervical support.  Patient oriented with good expressive and receptive function and good cognition.  Patient report that her anxiety is not an issue now and she is having more movement in arms and legs but impaired.    Behavioral Observation: Meredith Roach  presents as a 79 y.o.-year-old Right Caucasian Female who appeared her stated age. her dress was Appropriate and she was Well Groomed and her manners were Appropriate to the situation.  her participation was indicative of Appropriate and Attentive behaviors.  There were physical disabilities noted.  she displayed an appropriate level of cooperation and motivation.     Interactions:    Active Appropriate  Attention:   within normal limits and attention span and concentration were age appropriate  Memory:   within normal limits; recent and remote memory intact  Visuo-spatial:  not examined  Speech (Volume):  normal  Speech:   normal; normal  Thought Process:  Coherent and Relevant  Though Content:  WNL; not suicidal and not homicidal  Orientation:   person, place, time/date and situation  Judgment:   Good  Planning:   Fair  Affect:    Anxious  Mood:    Anxious  Insight:   Good  Intelligence:   normal  Medical History:   Past Medical History:  Diagnosis Date  . Arthritis   . GERD (gastroesophageal reflux disease)   . Gout   . Neuromuscular disorder ([place])   .  Pre-diabetes         Abuse/Trauma History: No prior abuse/trauma noted.  Psychiatric History:  No prior psychiatric history.    Family Med/Psych History:  Family History  Problem Relation Age of Onset  . Breast cancer Maternal Aunt   . Breast cancer Paternal Aunt   . CAD Father   . Heart attack Father     Impression/DX:  Meredith Roach  is a 79 year old female with history of gout as well as pre-diabetes.  Presented on 08/26/19 after ground level fall with resulting quadriparesis.  MRI showed severe spondylosis and severe spinal stenosis with cord compression from C3-C7 with signal change in spinal cord.  Patient underwent decompressive anterior cervical discectomy C3/4, C4/5, C5/6 and C6/7  With anterior cervical arthrodesis on 08/28/19.  Patient has had some times of anxiety with spasms but has improved recently during CIR program.  Patient was in wheelchair with cervical support.  Patient oriented with good expressive and receptive function and good cognition.  Patient report that her anxiety is not an issue now and she is having more movement in arms and legs but impaired.   Disposition/Plan:  Worked on coping and adjustment issues.  Patient denied any significant anxiety currently.  I do not think patient will need to see me again, but am available with anxiety reoccurs.    Diagnosis:    quadriparesis         Electronically Signed   _______________________ Ilean Skill, Psy.D.

## 2019-09-05 NOTE — Progress Notes (Signed)
Occupational Therapy Session Note  Patient Details  Name: Meredith Roach MRN: [id number] Date of Birth: [birth year]  Today's Date: 09/05/2019 OT Individual Time: 1304-1403 OT Individual Time Calculation (min): 59 min    Short Term Goals: Week 1:  OT Short Term Goal 1 (Week 1): Pt will be able to maintain static sit EOB with close S to engage more actively in UB self care. OT Short Term Goal 2 (Week 1): Pt will demonstrate improved R hand function to hold wash cloth in hand to wash thighs. OT Short Term Goal 3 (Week 1): Pt will be able to actively use hands to pull sleeves over arms. OT Short Term Goal 4 (Week 1): Pt will be able to rise to stand in Bowler lift with mod A to work towards completing [facility] transfers.  Skilled Therapeutic Interventions/Progress Updates:    Pt in the tilt in space wheelchair to start the session.  Took her down to the therapy gym and she transferred to the therapy mat squat pivot.  Had her then work on sitting balance at the EOM.  Overall min assist for balance with occasional LOB posteriorly.  Had her complete 3 sets of 5 repetitions for modified sit up with therapist behind her supporting and pt having to bring herself up from approximately 45 degree angle.  Incorporated functional reach in sitting with pt having to retrieve 2"X3" foam blocks from the bedside table in front of her, alternating UEs, and placing them on a container to the left.  Min assist for balance when reaching across to the container with either UE.  She needed frequent rest breaks after completion of 5-6 blocks.  Noted greater ability to pick up the blocks with the RUE compared to the left.  Finished session with standing intervals of approximately 45 seconds to one minute on the side of the therapy mat.  Mod assist to complete sit to stand and maintain.  She demonstrated decreased ability to maintain hip and knee extension secondary to motor weakness.  Returned to the room with pt sitting up in the  wheelchair at end of session.  Call button and phone in reach with safety alarm belt in place.    Therapy Documentation Precautions:  Precautions Precautions: Fall, Cervical Precaution Booklet Issued: No Precaution Comments: reviewed cervical precautions Required Braces or Orthoses: Cervical Brace Cervical Brace: Hard collar Restrictions Weight Bearing Restrictions: No  Pain: Pain Assessment Pain Scale: Faces Pain Score: 0-No pain ADL: See Care Tool Section for some details of mobility and selfcare  Therapy/Group: Individual Therapy  MCGUIRE,JAMES OTR/L 09/05/2019, 3:13 PM

## 2019-09-05 NOTE — Patient Care Conference (Signed)
Inpatient RehabilitationTeam Conference and Plan of Care Update Date: 09/05/2019   Time: 2:00 PM    Patient Name: Meredith Roach      Medical Record Number: [id number]  Date of Birth: [birth year] Sex: Female         Room/Bed: 4M11C/4M11C-01 Payor Info: Payor: [employer] MEDICARE / Plan: [employer] MEDICARE CHOICE PPO / Product Type: *No Product type* /    Admit Date/Time:  08/31/2019  5:23 PM  Primary Diagnosis:  Quadriplegia [facility])  Patient Active Problem List   Diagnosis Date Noted  . Quadriplegia ([place]) 08/31/2019  . S/P cervical spinal fusion 08/28/2019  . 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 Radiculopathy 10/21/2016    Expected Discharge Date: Expected Discharge Date: (TBD)  Team Members Present: Physician leading conference: Dr. [name] Care Coodinator Present: Loralee Pacas, LCSWA;Christina Sampson Goon, BSW;Other (comment)(Stacey Creig Hines, RN, BSN, CRRN) Nurse Present: Dorthula Nettles, RN PT Present: Excell Seltzer, PT OT Present: Willeen Cass, OT PPS Coordinator present : Ileana Ladd, Burna Mortimer, SLP     Current Status/Progress Goal Weekly Team Focus  Bowel/Bladder   pt has indwelling cath, incont oof bowel and bladder lbm 5/23  voiding on own, cintinence of bowel  continue bowel program   Swallow/Nutrition/ Hydration   dys 2 and thin, Supervision A  Mod I  trials fo dsy 3 and then regular textures, carryover of swallow strategies - short for ST   ADL's   Total A bathing and dressing at bedlevel, BSC transfer not attempted at this date, +2 transfers using Stedy  Min-Max overall  NMR, functional transfers, sitting balance, pt/family education, ADL retraining   Mobility   +2 bed mobility, min +2 to stand to stedy, transfer via stedy  mod A overall at w/c level  transfers, LE NMR   Communication             Safety/Cognition/ Behavioral Observations            Pain   pt complains of pain 8/10 and stiffness in  neck.  decrease pain to less then 4  assess q shift and prn, medicate prn    Skin   surgical incison on left side of neck  remain free from skin break down and infection  asess skin q shift and prn, steri strips in place on incision     Rehab Goals Patient on target to meet rehab goals: Yes *See Care Plan and progress notes for long and short-term goals.     Barriers to Discharge  Current Status/Progress Possible Resolutions Date Resolved   Nursing                  PT  Decreased caregiver support;Medical stability;Home environment access/layout;Incontinence;Neurogenic Bowel & Bladder  unsure if she has 24/7 support upon d/c home (husband cannot provide much physical assist)              OT Decreased caregiver support  husband can not do lifting; daughters work full time and can only help in the evening             SLP                Care Coordinator                Discharge Planning/Teaching Needs:  D/c to home with her husband who will provide 24/7 care. Pt reports their daughters will rotate their time. Dtr [name] intends to stay once school has  ended.  Family education as recommended   Team Discussion: Husband is not going to be available for a lot of hands on assist at discharge.   Revisions to Treatment Plan: MD to start Tamsulosin 0.4 mg daily after supper today.     Medical Summary Current Status: foley in place; bowel incontninent- c/o neck pain to nurse Weekly Focus/Goal: self care bed level- using steady to transfer  Barriers to Discharge: Decreased family/caregiver support;Home enviroment access/layout;Incontinence;Neurogenic Bowel & Bladder;Weight bearing restrictions;Wound care  Barriers to Discharge Comments: goals mod A Possible Resolutions to Barriers: slow progress with PT_ min-mod A bed- +2 with steady for transfers;- husband can't do much help- maybe set up- set mod A goals???   Continued Need for Acute Rehabilitation Level of Care: The patient requires  daily medical management by a physician with specialized training in physical medicine and rehabilitation for the following reasons: Direction of a multidisciplinary physical rehabilitation program to maximize functional independence : Yes Medical management of patient stability for increased activity during participation in an intensive rehabilitation regime.: Yes Analysis of laboratory values and/or radiology reports with any subsequent need for medication adjustment and/or medical intervention. : Yes   I attest that I was present, lead the team conference, and concur with the assessment and plan of the team.   Dorthula Nettles G 09/05/2019, 2:00 PM

## 2019-09-05 NOTE — Progress Notes (Signed)
Patient ID: Meredith Roach, female   DOB: Apr 08, 1941, 79 y.o.   MRN: [id number]  SW met with pt in room to provide updates from team conference, and d/c date remains within 3 weeks. Pt aware SW to follow-up with her husband.  SW left message for pt husband [name] [phone]) requesting return phone call to provide updates from team conference.   Loralee Pacas, MSW, [place] Office: [phone] Cell: [phone] Fax: [phone]

## 2019-09-05 NOTE — Progress Notes (Signed)
Speech Language Pathology Daily Session Note  Patient Details  Name: Meredith Roach MRN: [id number] Date of Birth: 1940-08-26  Today's Date: 09/05/2019 SLP Individual Time: 1230-1258 and 1030-1058 SLP Individual Time Calculation (min): 28 min and 28 min  Short Term Goals: Week 1: SLP Short Term Goal 1 (Week 1): Patient will consume current diet with minimal overt s/s of aspiration with Mod I for use of swallowing compensatory strategies. SLP Short Term Goal 2 (Week 1): Patient will consume trials of Dys. 3 textures without overt s/s of aspiration over 2 sessions with Mod I prior to upgrade.  Skilled Therapeutic Interventions: 1#  Skilled ST services focused on swallow skills. Pt demonstrated recall of swallow strategies with supervision A verbal cues. SLP facilitated PO consumption trials of dys 3 textured snack with thin liquids via straw, pt required min A physical assist with self-feeding, demonstrated oral clearance and expressed wants/needs to request liquid wash mod I. No overt s/s aspiration noted. SLP orderd dys 3 trial tray for session later today, pt agreed with the texture upgrade trial. Pt was left in room with call bell within reach and chair alarm set. ST recommends to continue skilled ST services.   2#  Skilled ST services focused on swallow skills. Pt's husband was present for treatment session.SLP facilitated PO consumption of dys 3 trial textured lunch tray, however trail reflected both dys 2 and dys 3 textures. Pt required supervision A verbal cues to utilize swallow strategies with dys 3 textures and supervision A self-feeding with weighted spoon. SLP upgraded diet to dys 3 textures. All questions were answered to satisfaction. Pt was left in room with call bell within reach and chair alarm set. ST recommends to continue skilled ST services.      Pain Pain Assessment Pain Scale: Faces Pain Score: 0-No pain  Therapy/Group: Individual Therapy  Dalma Panchal  [facility] 09/05/2019,  4:18 PM

## 2019-09-05 NOTE — Progress Notes (Signed)
Physical Therapy Session Note  Patient Details  Name: Meredith Roach MRN: [id number] Date of Birth: 09-14-1940  Today's Date: 09/05/2019 PT Individual Time: 0802-0858 PT Individual Time Calculation (min): 56 min   Short Term Goals: Week 1:  PT Short Term Goal 1 (Week 1): Pt will perform bed mobility with assist x 1 PT Short Term Goal 2 (Week 1): Pt will perform least restrictive transfer with assist x 1 PT Short Term Goal 3 (Week 1): Pt will initiate gait training as safe and able  Skilled Therapeutic Interventions/Progress Updates:    patient received in bed, pleasant and motivated to work with therapy. Needed totalA for donning teds and threading shorts but able to roll side to side with modA for donning of shorts, then actually able to perform sidelying to sit with ModA and use of bed features! Continues to require MinA to maintain sitting balance at EOB due to ongoing mild posterior lean. NT assisted in bed to chair transfer using stedy- able to do so with minAx2 and dependent pivot to WC. Otherwise worked on Cytogeneticist of BLEs and UEs in chair within cervical precautions including but not limited to B shoulder flexion, ankle pumps (with max A and thru small range) and long arc quads with holds at end range of knee extension, also heel slides and low height SLRs in bed. Left up in TIS WC with all needs met, seat belt on and chair alarm active this morning.   Therapy Documentation Precautions:  Precautions Precautions: Fall, Cervical Precaution Comments: reviewed cervical precautions Required Braces or Orthoses: Cervical Brace Cervical Brace: Hard collar Restrictions Weight Bearing Restrictions: No    Pain: Pain Assessment Pain Scale: 0-10 Pain Score: 0-No pain    Therapy/Group: Individual Therapy   Windell Norfolk, DPT, PN1   Supplemental Physical Therapist [place]    Pager [phone] Acute Rehab Office [phone]    09/05/2019, 12:13 PM

## 2019-09-05 NOTE — Progress Notes (Signed)
[place] PHYSICAL MEDICINE & REHABILITATION PROGRESS NOTE   Subjective/Complaints: Pt reports feeling good- slept well- never has any pain.   Thinks had results with bowel program- documented-wise- had BM BEFORE bowel program, and 2 other times yesterday but not with bowel program or since.   Willing to try flomax to see if can get voiding ability back- will start today and remove foley Thursday. Of note, WBC is rising up to 14k- was 13k yesterday- denies dysuria/feeling bad, however it appears that she has UTI based on U/A results.    ROS:  Pt denies SOB, abd pain, CP, N/V/C/D, and vision changes   Objective:   No results found. Recent Labs    09/04/19 0945 09/05/19 0721  WBC 13.3* 14.2*  HGB 14.6 15.1*  HCT 43.4 45.8  PLT 272 283   Recent Labs    09/04/19 0945  NA 134*  K 4.1  CL 101  CO2 24  GLUCOSE 161*  BUN 23  CREATININE 0.74  CALCIUM 8.7*    Intake/Output Summary (Last 24 hours) at 09/05/2019 1020 Last data filed at 09/05/2019 0818 Gross per 24 hour  Intake 738 ml  Output 1650 ml  Net -912 ml     Physical Exam: Vital Signs Blood pressure 134/67, pulse 65, temperature 97.6 F (36.4 C), resp. rate 16, height 5\' 3"  (1.6 m), weight 83.3 kg, SpO2 98 %.  Physical Exam Nursing noteand vitalsreviewed. Constitutional: wearing collar; sitting up in bed; appropriate, NAD HENT: conjugate gaze  Incision looks great Cardiovascular:RRR Respiratory:CTA B/L- no W/R/R- good air movement [id number], NT, ND, (+)BS  Musculoskeletal:  Comments: RUE- biceps 4/5, WE 3/5, triceps 4/5, grip 2/5 now and finger abd 2-/5 LUE- biceps 4/5, WE 3/5, triceps 3/5, grip 2-/5 on L- finger abd 1/5 today-otherwise UEs the same RLE- HF 2+/5, KE 4-/5, DF 1/5, PF 3/5, EHL 0/5 LLE- HF 2/5, KE 3+/5, DF 0/5, PF 3-/5, EHL 1/5 +Left hip tenderness Neurological: Ox3 C4 intact; C5 intact on R; decreased on L; C6-T1 decreased B/L, but absent on L T2-T12- decreased B/L- worse on  L L1-L3 nml on R- decreased on L; L4-S1 R nml; L decreased; S3-S5 decreased B/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 1 IV L hand- looks good- no infiltrate No issues on heels or bottom- skin intact Of note, R hand and feet very cold to touch- L hand is very warm Psychiatric:bright affect  Assessment/Plan: 1. 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  Physiatrist is providing close team supervision and 24 hour management of active medical problems listed below.  Physiatrist and rehab team continue to assess barriers to discharge/monitor patient progress toward functional and medical goals  Care Tool:  Bathing        Body parts bathed by helper: Right arm, Left arm, Chest, Abdomen, Front perineal area, Buttocks, Right upper leg, Left upper leg, Right lower leg, Left lower leg, Face     Bathing assist Assist Level: Total Assistance - Patient < 25%     Upper Body Dressing/Undressing Upper body dressing   What is the patient wearing?: Hospital gown only    Upper body assist Assist Level: Dependent - Patient 0%    Lower Body Dressing/Undressing Lower body dressing      What is the patient wearing?: Pants     Lower body assist Assist for lower body dressing: Maximal Assistance - Patient 25 -  49%     Toileting Toileting Toileting Activity did not occur (Probation officer and hygiene only): (pt is incontinent, not safe to sit on a BSC due to poor trunk control)  Toileting assist Assist for toileting: Total Assistance - Patient < 25%     Transfers Chair/bed transfer  Transfers assist     Chair/bed transfer assist level: Dependent - mechanical lift     Locomotion Ambulation   Ambulation assist   Ambulation activity did not occur: Safety/medical concerns          Walk 10 feet activity   Assist  Walk 10 feet  activity did not occur: Safety/medical concerns        Walk 50 feet activity   Assist Walk 50 feet with 2 turns activity did not occur: Safety/medical concerns         Walk 150 feet activity   Assist Walk 150 feet activity did not occur: Safety/medical concerns         Walk 10 feet on uneven surface  activity   Assist Walk 10 feet on uneven surfaces activity did not occur: Safety/medical concerns         Wheelchair     Assist Will patient use wheelchair at discharge?: Yes Type of Wheelchair: (TBD)    Wheelchair assist level: Dependent - Patient 0% Max wheelchair distance: 150'    Wheelchair 50 feet with 2 turns activity    Assist        Assist Level: Dependent - Patient 0%   Wheelchair 150 feet activity     Assist      Assist Level: Dependent - Patient 0%   Blood pressure 134/67, pulse 65, temperature 97.6 F (36.4 C), resp. rate 16, height 5\' 3"  (1.6 m), weight 83.3 kg, SpO2 98 %.  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 Status post ACDF 08/28/2019. Decadron taper. Cervical collar as directed. -need to order PRAFOs to prevent Ankle contractures -still on Decadron taper -patient may shower  5/22- improvement daily bodes well- explained that pt to /daughter  5/23- will discuss foley vs in/out caths with pt/daughter today vs Tuesday;   5/25- will try Flomax and remove foley Thursday to see if can void.  -ELOS/Goals: 4 weeks- goals min-mod assist  -Continue CIR 2. Antithrombotics: -DVT/anticoagulation:SCDs. Check vascular study and check with NSU to see if can start Lovenox 5/21- Dopplers (-) for DVT- need ot see when can start Lovenox 5/23- surgery was 5/17- hopefully NSU will let us start 5/27? -antiplatelet therapy: N/A 3. Pain Management:Baclofen 5 mg 3 times daily- will  likely need to increase, oxycodone as needed  5/22- not complaining of pain at all- might need to titrate baclofen after a few days.   5/25- will d/w pt need to increase Baclofen? Spasms? 4. Mood:Provide emotional support -antipsychotic agents: N/A 5. Neuropsych: This patientiscapable of making decisions on herown behalf. 6. Skin/Wound Care:Routine skin checksand turn while in bed every 2 hours while awake- keep on sides at night. 7. Fluids/Electrolytes/Nutrition:Routine in and outs with follow-up chemistries  5/24: BMP stable 8. Neurogenic bowel and bladder. con't foley until get her bowels cleaned out- can remove foley IF pt wants to d/c, and is able to do on own at home- if not, might need foley long term;. [place] program. Provide education.  5/21- ordered bowel program and explained to pt and daughter in layman's terms- will con't foley for now. Will order Lidocaine jelly for bowel program  5/22- good results in last 24 hours  5/23- BM with bowel program, but also at 0600- will change senokot to 2 tabs daily and not BID- will d/w foley vs caths with pt/family either today or Tuesday  5/25- 2-3 small-mdeium BMs yesterday but 1 right before bowel program, but not during-Will also start Flomax today and try and remove foley Thursday and do in/out caths.  9. GERD. Protonix 10. Hx of prediabetes- check an A1c and monitor as required.   5/21- can't find order- reordered for Monday- CBG 140s on BMP- since on decadron- if goes higher, will monitor more regularly.  5/23- labs in AM- ordered CBC/BMP weekly  11. Hx of gout- this acute event could be a trigger for gout- will monitor. 12. Leukocytosis with UTI:  5/24: WBC up to 13.3. Afebrile and asymptomatic. Draw UA/UC given indwelling catheter and trend tomorrow.  5/25- U/A (+) nitrites even though leuks small- will start Keflex 500 mg q8 hours and wait for Cx.      LOS: 5 days A FACE TO FACE EVALUATION WAS  PERFORMED  Zacharie Portner 09/05/2019, 10:20 AM

## 2019-09-05 NOTE — Progress Notes (Signed)
Bowel program completed and no bowel movement.

## 2019-09-06 ENCOUNTER — Inpatient Hospital Stay (HOSPITAL_COMMUNITY): Payer: Medicare PPO | Admitting: Speech Pathology

## 2019-09-06 ENCOUNTER — Inpatient Hospital Stay (HOSPITAL_COMMUNITY): Payer: Medicare PPO

## 2019-09-06 ENCOUNTER — Inpatient Hospital Stay (HOSPITAL_COMMUNITY): Payer: Medicare PPO | Admitting: Physical Therapy

## 2019-09-06 MED ORDER — ENOXAPARIN SODIUM 40 MG/0.4ML ~~LOC~~ SOLN
40.0000 mg | Freq: Every day | SUBCUTANEOUS | Status: DC
  Administered 2019-09-06 – 2019-09-29 (×24): 40 mg via SUBCUTANEOUS
  Filled 2019-09-06 (×24): qty 0.4

## 2019-09-06 NOTE — Progress Notes (Signed)
Patient ID: Meredith Roach, female   DOB: Dec 12, 1940, 79 y.o.   MRN: [id number]   SW received return call from pt husband [name] [phone]). SW provide updates from team conference. SW informed there will continue to be weekly updates. No questions/concerns reported.   Loralee Pacas, MSW, [place] Office: [phone] Cell: [phone] Fax: [phone]

## 2019-09-06 NOTE — Progress Notes (Signed)
[place] PHYSICAL MEDICINE & REHABILITATION PROGRESS NOTE   Subjective/Complaints:  Pt reports no Sx's of UTI- however does have 1.  Slept better with oxy for pain last nguth- tingling in UEs/hand since hasn't gotten nerve pain meds this AM yet.   Ate ~75% of breakfast this AM.  ROS:  Pt denies SOB, abd pain, CP, N/V/C/D, and vision changes   Objective:   No results found. Recent Labs    09/04/19 0945 09/05/19 0721  WBC 13.3* 14.2*  HGB 14.6 15.1*  HCT 43.4 45.8  PLT 272 283   Recent Labs    09/04/19 0945  NA 134*  K 4.1  CL 101  CO2 24  GLUCOSE 161*  BUN 23  CREATININE 0.74  CALCIUM 8.7*    Intake/Output Summary (Last 24 hours) at 09/06/2019 0920 Last data filed at 09/06/2019 0800 Gross per 24 hour  Intake 420 ml  Output 1450 ml  Net -1030 ml     Physical Exam: Vital Signs Blood pressure 127/72, pulse 66, temperature 98.2 F (36.8 C), resp. rate 16, height 5\' 3"  (1.6 m), weight 76.8 kg, SpO2 96 %.  Physical Exam Nursing noteand vitalsreviewed. Constitutional: wearing collar; sitting up in bed; with OT at bedside, NAD HENT: conjugate gaze  Incision looks great still Cardiovascular:RRR Respiratory:CTA B/L- no W/R/R- good air movement [id number], NT, ND, (+)BS  Musculoskeletal:  Comments: RUE- biceps 4/5, WE 3/5, triceps 4/5, grip 2/5 now and finger abd 2-/5 LUE- biceps 4/5, WE 3/5, triceps 3/5, grip 2-/5 on L- finger abd 1/5 today-otherwise UEs the same RLE- HF 2+/5, KE 4-/5, DF 1/5, PF 3/5, EHL 0/5 LLE- HF 2/5, KE 3+/5, DF 0/5, PF 3-/5, EHL 1/5 +Left hip tenderness Neurological: Ox3 C4 intact; C5 intact on R; decreased on L; C6-T1 decreased B/L, but absent on L T2-T12- decreased B/L- worse on L L1-L3 nml on R- decreased on L; L4-S1 R nml; L decreased; S3-S5 decreased B/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 1 IV L hand- looks good- no infiltrate No issues on heels or bottom- skin intact Of note, hands more normal  temperature Psychiatric:bright affect  Assessment/Plan: 1. 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  Physiatrist is providing close team supervision and 24 hour management of active medical problems listed below.  Physiatrist and rehab team continue to assess barriers to discharge/monitor patient progress toward functional and medical goals  Care Tool:  Bathing        Body parts bathed by helper: Right arm, Left arm, Chest, Abdomen, Front perineal area, Buttocks, Right upper leg, Left upper leg, Right lower leg, Left lower leg, Face     Bathing assist Assist Level: Total Assistance - Patient < 25%     Upper Body Dressing/Undressing Upper body dressing   What is the patient wearing?: Hospital gown only    Upper body assist Assist Level: Dependent - Patient 0%    Lower Body Dressing/Undressing Lower body dressing      What is the patient wearing?: Pants     Lower body assist Assist for lower body dressing: Maximal Assistance - Patient 25 - 49%     Toileting Toileting Toileting Activity did not occur (Clothing management and hygiene only): (pt is incontinent, not safe to sit on a BSC due to poor trunk control)  Toileting assist Assist for toileting: Total Assistance - Patient < 25%  Transfers Chair/bed transfer  Transfers assist     Chair/bed transfer assist level: Moderate Assistance - Patient 50 - 74%(squat pivot)     Locomotion Ambulation   Ambulation assist   Ambulation activity did not occur: Safety/medical concerns          Walk 10 feet activity   Assist  Walk 10 feet activity did not occur: Safety/medical concerns        Walk 50 feet activity   Assist Walk 50 feet with 2 turns activity did not occur: Safety/medical concerns         Walk 150 feet activity   Assist Walk 150 feet activity did not occur:  Safety/medical concerns         Walk 10 feet on uneven surface  activity   Assist Walk 10 feet on uneven surfaces activity did not occur: Safety/medical concerns         Wheelchair     Assist Will patient use wheelchair at discharge?: Yes Type of Wheelchair: (TBD)    Wheelchair assist level: Dependent - Patient 0% Max wheelchair distance: 150'    Wheelchair 50 feet with 2 turns activity    Assist        Assist Level: Dependent - Patient 0%   Wheelchair 150 feet activity     Assist      Assist Level: Dependent - Patient 0%   Blood pressure 127/72, pulse 66, temperature 98.2 F (36.8 C), resp. rate 16, height 5\' 3"  (1.6 m), weight 76.8 kg, SpO2 96 %.  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 Status post ACDF 08/28/2019. Decadron taper. Cervical collar as directed. -need to order PRAFOs to prevent Ankle contractures -still on Decadron taper -patient may shower  5/22- improvement daily bodes well- explained that pt to /daughter  5/23- will discuss foley vs in/out caths with pt/daughter today vs Tuesday;   5/25- will try Flomax and remove foley Thursday to see if can void.  -ELOS/Goals: 4 weeks- goals min-mod assist  -Continue CIR 2. Antithrombotics: -DVT/anticoagulation:SCDs. Check vascular study and check with NSU to see if can start Lovenox 5/21- Dopplers (-) for DVT- need to see when can start Lovenox 5/23- surgery was 5/17- hopefully NSU will let us start 5/27? 5/26- will call to see if can start Lovenox- has been 9 days -antiplatelet therapy: N/A 3. Pain Management:Baclofen 5 mg 3 times daily- will likely need to increase, oxycodone as needed  5/22- not complaining of pain at all- might need to titrate baclofen after a few days.   5/25- will d/w pt need to increase Baclofen? Spasms? 4.  Mood:Provide emotional support -antipsychotic agents: N/A 5. Neuropsych: This patientiscapable of making decisions on herown behalf. 6. Skin/Wound Care:Routine skin checksand turn while in bed every 2 hours while awake- keep on sides at night. 7. Fluids/Electrolytes/Nutrition:Routine in and outs with follow-up chemistries  5/24: BMP stable 8. Neurogenic bowel and bladder. con't foley until get her bowels cleaned out- can remove foley IF pt wants to d/c, and is able to do on own at home- if not, might need foley long term;. [place] program. Provide education.  5/21- ordered bowel program and explained to pt and daughter in layman's terms- will con't foley for now. Will order Lidocaine jelly for bowel program  5/22- good results in last 24 hours  5/23- BM with bowel program, but also at 0600- will change senokot to 2 tabs daily and not BID- will d/w foley  vs caths with pt/family either today or Tuesday  5/25- 2-3 small-mdeium BMs yesterday but 1 right before bowel program, but not during-Will also start Flomax today and try and remove foley Thursday and do in/out caths.   5/26- remove foley in AM 9. GERD. Protonix 10. Hx of prediabetes- check an A1c and monitor as required.   5/21- can't find order- reordered for Monday- CBG 140s on BMP- since on decadron- if goes higher, will monitor more regularly.  5/23- labs in AM- ordered CBC/BMP weekly  11. Hx of gout- this acute event could be a trigger for gout- will monitor.  5/26- no gout Sx's so far 12. Leukocytosis with UTI:  5/24: WBC up to 13.3. Afebrile and asymptomatic. Draw UA/UC given indwelling catheter and trend tomorrow.  5/25- U/A (+) nitrites even though leuks small- will start Keflex 500 mg q8 hours and wait for Cx.    5/26- >100k E coli, but sensitivities are pending.      LOS: 6 days A FACE TO FACE EVALUATION WAS PERFORMED  Randol Zumstein 09/06/2019, 9:20 AM

## 2019-09-06 NOTE — Progress Notes (Signed)
Physical Therapy Session Note  Patient Details  Name: Meredith Roach MRN: [id number] Date of Birth: 07-02-1940  Today's Date: 09/06/2019 PT Individual Time: 0805-0903 PT Individual Time Calculation (min): 58 min   Short Term Goals: Week 1:  PT Short Term Goal 1 (Week 1): Pt will perform bed mobility with assist x 1 PT Short Term Goal 2 (Week 1): Pt will perform least restrictive transfer with assist x 1 PT Short Term Goal 3 (Week 1): Pt will initiate gait training as safe and able  Skilled Therapeutic Interventions/Progress Updates: Pt presented in bed agreeable to theray. No c/o pain however c/o tingling/numbness in hands. PTA donned thigh high TED hose total A. PTA threaded shorts total A however pt was able to perform rolling to R modA and L minA to allow PTA to pull pants over hips. Performed supine to sit modA at EOB. Performed STS with Stedy minA x 2 and transferred to [facility]. Pt transported to day room and participated in x 3 bouts of Cybex Kinetron at 70cm/sec 2 min ea for hamstring  And hip flexor strengthening. Pt also performed LAQ with 3 sec hold x 10 bilaterally and AAROM ankle pumps followed by heel cord stretching. Pt transported back to room and agreeable to remain in chair however noted poor positioning of head rest. PTA unable to re-adjust head rest, notified primary therapist. PTA was able to provide support by placing blanket between headrest and pt's head with pt indicating provided comfortable support. Pt left in TIS with belt alarm on, soft touch call bell within reach and needs met.      Therapy Documentation Precautions:  Precautions Precautions: Fall, Cervical Precaution Booklet Issued: No Precaution Comments: reviewed cervical precautions Required Braces or Orthoses: Cervical Brace Cervical Brace: Hard collar Restrictions Weight Bearing Restrictions: No    Therapy/Group: Individual Therapy  Henretter Piekarski  Nigel Wessman, PTA  09/06/2019, 2:41 PM

## 2019-09-06 NOTE — Progress Notes (Signed)
Occupational Therapy Session Note  Patient Details  Name: Meredith Roach MRN: [id number] Date of Birth: 1940-06-19  Today's Date: 09/06/2019 OT Individual Time: 1100-1155 OT Individual Time Calculation (min): 55 min    Short Term Goals: Week 1:  OT Short Term Goal 1 (Week 1): Pt will be able to maintain static sit EOB with close S to engage more actively in UB self care. OT Short Term Goal 2 (Week 1): Pt will demonstrate improved R hand function to hold wash cloth in hand to wash thighs. OT Short Term Goal 3 (Week 1): Pt will be able to actively use hands to pull sleeves over arms. OT Short Term Goal 4 (Week 1): Pt will be able to rise to stand in [place] lift with mod A to work towards completing [facility] transfers.  Skilled Therapeutic Interventions/Progress Updates:    1:1. Pt received in TIS agreeable to OT. Pt educated on edema management, elevation of UEs at rest d/t swelling to improve ROM in fingers as edema is getting in the way. Edema massage performed 5 min on R hand and forearm and coban applied from digits to mid forearm on dorsal side of hand for edema management. Pt performs AAROM of wrist extension/flexion and digit opposition. Pt completes box and blocks RUE 11 LUE 1 seated at table top. Pt removes plasteic pegs from foam board for slight resistance/grip training and crosses midline to place in bin on L with RUE. Pt reaches R to obtain large wooden pegs with coban rap from OT and places onto wooden board into large holes for RUE coordination. If dropped pt unable to retrieve from table or turn in hand to manipulate into place. Pt able to remove pegs from board with LUE and place into container on R. PT educated on pressure relief and performs pressure break with OT tilting back for 2 min. Exited session with pt set up with pillows under forearms for elevation and soft touch call light tin reach. RN alerted to positioning needs for hand elevation.   Therapy Documentation Precautions:   Precautions Precautions: Fall, Cervical Precaution Booklet Issued: No Precaution Comments: reviewed cervical precautions Required Braces or Orthoses: Cervical Brace Cervical Brace: Hard collar Restrictions Weight Bearing Restrictions: No General:   Vital Signs:   Pain: Pain Assessment Pain Scale: 0-10 Pain Score: 0-No pain ADL: ADL Eating: Minimal assistance Grooming: Moderate assistance Upper Body Bathing: Maximal assistance(with hand over hand A to hold washcloth) Lower Body Bathing: Dependent Upper Body Dressing: Dependent Where Assessed-Upper Body Dressing: Edge of bed Lower Body Dressing: Dependent Where Assessed-Lower Body Dressing: Bed level Toilet Transfer: Unable to assess Tub/Shower Transfer Method: Unable to assess Vision   Perception    Praxis   Exercises:   Other Treatments:     Therapy/Group: Individual Therapy  Tonny Branch 09/06/2019, 12:17 PM

## 2019-09-06 NOTE — Progress Notes (Signed)
Speech Language Pathology Daily Session Note  Patient Details  Name: Meredith Roach MRN: [id number] Date of Birth: 1940/08/10  Today's Date: 09/06/2019 SLP Individual Time: 0700-0730 SLP Individual Time Calculation (min): 30 min  Short Term Goals: Week 1: SLP Short Term Goal 1 (Week 1): Patient will consume current diet with minimal overt s/s of aspiration with Mod I for use of swallowing compensatory strategies. SLP Short Term Goal 2 (Week 1): Patient will consume trials of Dys. 3 textures without overt s/s of aspiration over 2 sessions with Mod I prior to upgrade.  Skilled Therapeutic Interventions: Skilled treatment session focused on dysphagia goals. SLP facilitated session by providing skilled observation with breakfast meal of Dys. 3 textures. Patient demonstrated efficient mastication with complete oral clearance without overt s/s of aspiration or reports of a globus sensation. Patient was also overall Mod I for use of swallowing compensatory strategies. Recommend patient continue current diet. Patient left semi-reclined in bed with alarm on and all needs within reach. Continue with current plan of care.      Pain No/Denies Pain   Therapy/Group: Individual Therapy  Glenis Musolf 09/06/2019, 8:34 AM

## 2019-09-06 NOTE — Progress Notes (Signed)
Physical Therapy Session Note  Patient Details  Name: Meredith Roach MRN: [id number] Date of Birth: 10-11-1940  Today's Date: 09/06/2019 PT Individual Time: 1300-1400 PT Individual Time Calculation (min): 60 min   Short Term Goals: Week 1:  PT Short Term Goal 1 (Week 1): Pt will perform bed mobility with assist x 1 PT Short Term Goal 2 (Week 1): Pt will perform least restrictive transfer with assist x 1 PT Short Term Goal 3 (Week 1): Pt will initiate gait training as safe and able  Skilled Therapeutic Interventions/Progress Updates:    Pt received seated in TIS w/c in room, agreeable to PT session. Pt reports intermittent pain in RUE at times today but no pain during session. Dependent transport via TIS w/c to/from therapy gym. Sit to stand in // bars with mod A. Pt struggles to maintain upright stance due to poor ability to support herself with use of BUE on // bars, reports onset of neck soreness and strain in this position. Squat pivot transfer w/c to mat table with max A. Set pt up with [place] walker for improved support in standing. Sit to stand to [facility] walker x 2 reps with mod A to stand. Once in standing pt able to maintain upright stance with BUE on Eva walker with min to mod A overall for up to 2 minutes. Attempt to lift alt LE in standing but pt unable to maintain upright stance without knees buckling. Pt also has increase in B knee flexion with onset of fatigue. Stedy transfer back to w/c then back to bed. Sit to supine mod A for BLE management. Pt left semi-reclined in bed with needs in reach, husband present at end of session.  Therapy Documentation Precautions:  Precautions Precautions: Fall, Cervical Precaution Booklet Issued: No Precaution Comments: reviewed cervical precautions Required Braces or Orthoses: Cervical Brace Cervical Brace: Hard collar Restrictions Weight Bearing Restrictions: No    Therapy/Group: Individual Therapy   Excell Seltzer, PT, DPT  09/06/2019,  4:03 PM

## 2019-09-07 ENCOUNTER — Inpatient Hospital Stay (HOSPITAL_COMMUNITY): Payer: Medicare PPO | Admitting: Speech Pathology

## 2019-09-07 ENCOUNTER — Inpatient Hospital Stay (HOSPITAL_COMMUNITY): Payer: Medicare PPO | Admitting: Occupational Therapy

## 2019-09-07 ENCOUNTER — Inpatient Hospital Stay (HOSPITAL_COMMUNITY): Payer: Medicare PPO | Admitting: Physical Therapy

## 2019-09-07 LAB — CBC WITH DIFFERENTIAL/PLATELET
Abs Immature Granulocytes: 0.26 10*3/uL — ABNORMAL HIGH (ref 0.00–0.07)
Basophils Absolute: 0 10*3/uL (ref 0.0–0.1)
Basophils Relative: 0 %
Eosinophils Absolute: 0 10*3/uL (ref 0.0–0.5)
Eosinophils Relative: 0 %
HCT: 44.1 % (ref 36.0–46.0)
Hemoglobin: 14.5 g/dL (ref 12.0–15.0)
Immature Granulocytes: 2 %
Lymphocytes Relative: 8 %
Lymphs Abs: 1.2 10*3/uL (ref 0.7–4.0)
MCH: 28.5 pg (ref 26.0–34.0)
MCHC: 32.9 g/dL (ref 30.0–36.0)
MCV: 86.8 fL (ref 80.0–100.0)
Monocytes Absolute: 0.6 10*3/uL (ref 0.1–1.0)
Monocytes Relative: 4 %
Neutro Abs: 12 10*3/uL — ABNORMAL HIGH (ref 1.7–7.7)
Neutrophils Relative %: 86 %
Platelets: 266 10*3/uL (ref 150–400)
RBC: 5.08 MIL/uL (ref 3.87–5.11)
RDW: 12.8 % (ref 11.5–15.5)
WBC: 14.1 10*3/uL — ABNORMAL HIGH (ref 4.0–10.5)
nRBC: 0 % (ref 0.0–0.2)

## 2019-09-07 LAB — URINE CULTURE: Culture: >=100000 — AB

## 2019-09-07 LAB — BASIC METABOLIC PANEL
Anion gap: 11 (ref 5–15)
BUN: 24 mg/dL — ABNORMAL HIGH (ref 8–23)
CO2: 29 mmol/L (ref 22–32)
Calcium: 8.9 mg/dL (ref 8.9–10.3)
Chloride: 99 mmol/L (ref 98–111)
Creatinine, Ser: 0.89 mg/dL (ref 0.44–1.00)
GFR calc Af Amer: >60 mL/min (ref >60)
GFR calc non Af Amer: >60 mL/min (ref >60)
Glucose, Bld: 144 mg/dL — ABNORMAL HIGH (ref 70–99)
Potassium: 5.2 mmol/L — ABNORMAL HIGH (ref 3.5–5.1)
Sodium: 139 mmol/L (ref 135–145)

## 2019-09-07 MED ORDER — SULFAMETHOXAZOLE-TRIMETHOPRIM 800-160 MG PO TABS
1.0000 | ORAL_TABLET | Freq: Two times a day (BID) | ORAL | Status: DC
  Administered 2019-09-07 – 2019-09-08 (×3): 1 via ORAL
  Filled 2019-09-07 (×3): qty 1

## 2019-09-07 NOTE — Progress Notes (Signed)
Occupational Therapy Session Note  Patient Details  Name: Meredith Roach MRN: [id number] Date of Birth: 1940/06/24  Today's Date: 09/07/2019 OT Individual Time: [id number] Individual Time Calculation (min): 57 min    Short Term Goals: Week 1:  OT Short Term Goal 1 (Week 1): Pt will be able to maintain static sit EOB with close S to engage more actively in UB self care. OT Short Term Goal 2 (Week 1): Pt will demonstrate improved R hand function to hold wash cloth in hand to wash thighs. OT Short Term Goal 3 (Week 1): Pt will be able to actively use hands to pull sleeves over arms. OT Short Term Goal 4 (Week 1): Pt will be able to rise to stand in [place] lift with mod A to work towards completing [facility] transfers.  Skilled Therapeutic Interventions/Progress Updates:    Pt was taken in her wheelchair down to the therapy gym for session.  She completed stand pivot transfer to the therapy mat with max assist stand pivot.  She was then able to transition into supine with mod assist.  Had her work on rolling on the therapy mat with initiation of knee flexion with min assist as well as shoulder horizontal adduction.  She was able to roll with min assist to both sides.  Also had her work on AAROM shoulder flexion in supine position.  She was able to perform 2 sets of 10 repetitions for shoulder flexion to approximately 90 degrees with the elbow flexed.  Min assist was needed for terminal elbow extension during shoulder flexion on the right with mod assist to achieve this on the left.  She transitioned back to sitting on the mat with max assist and then worked on BUE hand and arm coordination with Box and Blocks test.  She was able to complete 15 blocks with the LUE and 17 with the RUE in 1 minute.  Had her transfer back to the wheelchair at max assist stand pivot with transfer to the bed at the same level.  She worked on rolling in the bed with overall min assist while therapist provided total assist for  doffing slightly soiled brief, performing bowel clean up, and donning new brief.  Her shoes were removed with total assist and she remained in the bed to conclude session.  Call button and phone in reach with bed alarm in place.     Therapy Documentation Precautions:  Precautions Precautions: Fall, Cervical Precaution Booklet Issued: No Precaution Comments: reviewed cervical precautions Required Braces or Orthoses: Cervical Brace Cervical Brace: Hard collar Restrictions Weight Bearing Restrictions: No   Pain: Pain Assessment Pain Scale: 0-10 Pain Score: 0-No pain ADL: See Care Tool Section for some details of mobility and selfcare  Therapy/Group: Individual Therapy  Trinidee Schrag OTR/L 09/07/2019, 4:34 PM

## 2019-09-07 NOTE — Progress Notes (Signed)
Physical Therapy Session Note  Patient Details  Name: Meredith Roach MRN: [id number] Date of Birth: Jun 30, 1940  Today's Date: 09/07/2019 PT Individual Time: 1002-1129 PT Individual Time Calculation (min): 87 min   Short Term Goals: Week 1:  PT Short Term Goal 1 (Week 1): Pt will perform bed mobility with assist x 1 PT Short Term Goal 2 (Week 1): Pt will perform least restrictive transfer with assist x 1 PT Short Term Goal 3 (Week 1): Pt will initiate gait training as safe and able  Skilled Therapeutic Interventions/Progress Updates:    Patient received up in TIS WC, pleasant and willing to work with therapy. Able to perform stedy transfers with ModAx1 but has a hard time gripping stedy crossbar and continues to lean on stedy with forearms, able to maintain balance on stedy seat with min guard-close supervision for transfers with one person. Focused session on unsupported sitting balance at edge of mat table, able to maintain with S and Min cues for occasional postural corrections statically, continues to be extremely challenged by dynamic challenges however. Worked on isometric core all directions at edge of mat table and lateral trunk lean to elbow with modA to return to upright B, then able to complete sit to supine with ModA and worked on rolling technique bilaterally, then required MaxA x2 for sidelying to sit from flat mat table. Also worked on BLE and BUE strengthening at edge of mat table and from [place], heel cord stretches, gravity eliminated ankle pumps, mini-seated marches, and shoulder flexion and abduction to shoulder height, also performed unsupported sitting in WC with cone reaches within cervical precautions. Fatigued at EOS, left in [facility] with all needs met and seatbelt alarm active, family present this afternoon.  Therapy Documentation Precautions:  Precautions Precautions: Fall, Cervical Precaution Booklet Issued: No Precaution Comments: reviewed cervical  precautions Required Braces or Orthoses: Cervical Brace Cervical Brace: Hard collar Restrictions Weight Bearing Restrictions: No GPain: Pain Assessment Pain Scale: 0-10 Pain Score: 0-No pain    Therapy/Group: Individual Therapy   Windell Norfolk, DPT, PN1   Supplemental Physical Therapist [place]    Pager [phone] Acute Rehab Office [phone]    09/07/2019, 4:03 PM

## 2019-09-07 NOTE — Progress Notes (Signed)
Physical Therapy Session Note  Patient Details  Name: Meredith Roach MRN: [id number] Date of Birth: 03-08-1941  Today's Date: 09/07/2019 PT Individual Time: 0800-0900 PT Individual Time Calculation (min): 60 min   Short Term Goals: Week 1:  PT Short Term Goal 1 (Week 1): Pt will perform bed mobility with assist x 1 PT Short Term Goal 2 (Week 1): Pt will perform least restrictive transfer with assist x 1 PT Short Term Goal 3 (Week 1): Pt will initiate gait training as safe and able  Skilled Therapeutic Interventions/Progress Updates:    Pt received seated in bed, agreeable to PT session. No complaints of pain. Pt is dependent to don TED hose, max A to don pants, and dependent to don shoes at bed level. Supine to sit with max A for BLE management and trunk elevation. Sitting balance EOB with min A due to occasional posterior bias. Sit to stand with max A to stedy. Stedy transfer to w/c, min A to stand from perched stedy seat. Assisted pt with oral hygiene while seated in w/c at sink. Dependent transport via TIS w/c to/from therapy gym. Pt is min A for lateral leans and trunk control while placing standing frame sling. Sit to stand in standing frame. While in standing pt able to perform mini-squats 2 x 10 reps with focus on eccentric control when squatting down vs relying on stedy sling to catch her, lateral weight shifts L/R with attempts to lift LE, heel raises 3 x 10 reps. Pt exhibits improved ability to maneuver LE in standing in standing frame this date. Pt left semi-reclined in TIS w/c in room with needs in reach at end of session.  Therapy Documentation Precautions:  Precautions Precautions: Fall, Cervical Precaution Booklet Issued: No Precaution Comments: reviewed cervical precautions Required Braces or Orthoses: Cervical Brace Cervical Brace: Hard collar Restrictions Weight Bearing Restrictions: No    Therapy/Group: Individual Therapy   Excell Seltzer, PT, DPT  09/07/2019,  10:52 AM

## 2019-09-07 NOTE — Progress Notes (Signed)
Speech Language Pathology Daily Session Note  Patient Details  Name: Meredith Roach MRN: [id number] Date of Birth: [birth year]  Today's Date: 09/07/2019 SLP Individual Time: 1130-1150 SLP Individual Time Calculation (min): 20 min  Short Term Goals: Week 1: SLP Short Term Goal 1 (Week 1): Patient will consume current diet with minimal overt s/s of aspiration with Mod I for use of swallowing compensatory strategies. SLP Short Term Goal 2 (Week 1): Patient will consume trials of Dys. 3 textures without overt s/s of aspiration over 2 sessions with Mod I prior to upgrade.  Skilled Therapeutic Interventions:  Pt was seen for skilled ST targeting dysphagia.  Pt reports excellent toleration of dys 3 textures during meal times.  As a result, SLP facilitated the session with trials of regular textures to continue working towards diet progression.  Pt consumed graham crackers and peanut butter with no complaints of globus, no s/s of aspiration, and adequate mastication of advanced solids.  Recommend ongoing trials of regular textures with SLP for 1-2 additional sessions to ensure adequate toleration of possible diet advancement.  Pt was left in wheelchair with family at bedside.  Continue per current plan of care.    Pain Pain Assessment Pain Scale: 0-10 Pain Score: 0-No pain  Therapy/Group: Individual Therapy  Meredith Roach, Selinda Orion 09/07/2019, 1:50 PM

## 2019-09-07 NOTE — Progress Notes (Signed)
Nurse performed Bowel Program to patient at 2040  last night due to patient has some company after dinner. Nurse positioned patient  to left lateral. Patient already had medium incontinent BM prior to performing dig stim. Nurse cleansed patient and checked patient rectal vault for stool residual via dig stim for 10 minutes. Evidence of soft, pasty brown stool left in rectal vault. Nurse able to cleared small amount of residual stool prior to administered the Dulcolax Suppository. Nurse informed patient to stay side laying position for at least 20-30 minutes for the suppository to take effect. Patient is covered with call button within reach. Nurse came back at 2120 to check patient. Patient had another medium brown, mushy stool. Nurse check patient for stool residual via dig stim for another 10 minutes,  rectal vault is cleared. Peri care and foley care provided after bowel program.

## 2019-09-07 NOTE — Progress Notes (Signed)
Bowel program was initiated after dinner.Dig stim was performed prior to suppository. Will continue with program.  Doy Hutching, LPN

## 2019-09-07 NOTE — Progress Notes (Signed)
[facility] PHYSICAL MEDICINE & REHABILITATION PROGRESS NOTE   Subjective/Complaints:  Still no Sx's of UTI- had BM last night WITH bowel program- excited about that.   Using Ktape on RUE/hand for swelling.    ROS:  Pt denies SOB, abd pain, CP, N/V/C/D, and vision changes Objective:   No results found. Recent Labs    09/05/19 0721 09/07/19 0606  WBC 14.2* 14.1*  HGB 15.1* 14.5  HCT 45.8 44.1  PLT 283 266   Recent Labs    09/04/19 0945 09/07/19 0606  NA 134* 139  K 4.1 5.2*  CL 101 99  CO2 24 29  GLUCOSE 161* 144*  BUN 23 24*  CREATININE 0.74 0.89  CALCIUM 8.7* 8.9    Intake/Output Summary (Last 24 hours) at 09/07/2019 0845 Last data filed at 09/07/2019 0534 Gross per 24 hour  Intake 842 ml  Output 2150 ml  Net -1308 ml     Physical Exam: Vital Signs Blood pressure (!) 141/67, pulse 62, temperature 98.6 F (37 C), resp. rate 17, height 5\' 3"  (1.6 m), weight 76.8 kg, SpO2 97 %.  Physical Exam Nursing noteand vitalsreviewed. Constitutional: wearing collar- sitting up in bed eating breakfast, NAD HENT: conjugate gaze  Incision looks good- (+) steristrips Cardiovascular:RRR Respiratory:little coarse BS- but good air movement B/L [id number], NT, ND, (+)BS  Musculoskeletal:  Comments:  RUE- biceps 5-/5 B/L, WE 3/5 on R 2/5 on L; triceps 4-/5 on R; 3/5 on L; grip 2+/5 on R; 2/5 on L; finger abd 1/5 B/L RLE- HF 2+/5, KE 4-/5, DF 1/5, PF 3/5, EHL 0/5 LLE- HF 2/5, KE 3+/5, DF 0/5, PF 3-/5, EHL 1/5 +Left hip tenderness Neurological: Ox3 C4 intact; C5 intact on R; decreased on L; C6-T1 decreased B/L, but absent on L T2-T12- decreased B/L- worse on L L1-L3 nml on R- decreased on L; L4-S1 R nml; L decreased; S3-S5 decreased B/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Ktape on R hand/wrist No issues on heels or bottom- skin intact Of note, hands more normal temperature Psychiatric:appropriate  Assessment/Plan: 1. Functional deficits  secondary to C3 ASIA C quadriplegia which require 3+ hours per day of interdisciplinary therapy in a comprehensive inpatient rehab setting.  Physiatrist is providing close team supervision and 24 hour management of active medical problems listed below.  Physiatrist and rehab team continue to assess barriers to discharge/monitor patient progress toward functional and medical goals  Care Tool:  Bathing        Body parts bathed by helper: Right arm, Left arm, Chest, Abdomen, Front perineal area, Buttocks, Right upper leg, Left upper leg, Right lower leg, Left lower leg, Face     Bathing assist Assist Level: Total Assistance - Patient < 25%     Upper Body Dressing/Undressing Upper body dressing   What is the patient wearing?: Hospital gown only    Upper body assist Assist Level: Dependent - Patient 0%    Lower Body Dressing/Undressing Lower body dressing      What is the patient wearing?: Pants     Lower body assist Assist for lower body dressing: Maximal Assistance - Patient 25 - 49%     Toileting Toileting Toileting Activity did not occur (Clothing management and hygiene only): (pt is incontinent, not safe to sit on a BSC due to poor trunk control)  Toileting assist Assist for toileting: Total Assistance - Patient < 25%     Transfers Chair/bed transfer  Transfers assist     Chair/bed transfer assist  level: Maximal Assistance - Patient 25 - 49%     Locomotion Ambulation   Ambulation assist   Ambulation activity did not occur: Safety/medical concerns          Walk 10 feet activity   Assist  Walk 10 feet activity did not occur: Safety/medical concerns        Walk 50 feet activity   Assist Walk 50 feet with 2 turns activity did not occur: Safety/medical concerns         Walk 150 feet activity   Assist Walk 150 feet activity did not occur: Safety/medical concerns         Walk 10 feet on uneven surface  activity   Assist Walk 10 feet  on uneven surfaces activity did not occur: Safety/medical concerns         Wheelchair     Assist Will patient use wheelchair at discharge?: Yes Type of Wheelchair: (TBD)    Wheelchair assist level: Dependent - Patient 0% Max wheelchair distance: 150'    Wheelchair 50 feet with 2 turns activity    Assist        Assist Level: Dependent - Patient 0%   Wheelchair 150 feet activity     Assist      Assist Level: Dependent - Patient 0%   Blood pressure (!) 141/67, pulse 62, temperature 98.6 F (37 C), resp. rate 17, height 5\' 3"  (1.6 m), weight 76.8 kg, SpO2 97 %.  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 Status post ACDF 08/28/2019. Decadron taper. Cervical collar as directed. -need to order PRAFOs to prevent Ankle contractures -still on Decadron taper -patient may shower  5/22- improvement daily bodes well- explained that pt to /daughter  5/23- will discuss foley vs in/out caths with pt/daughter today vs Tuesday;   5/25- will try Flomax and remove foley Thursday to see if can void.  -ELOS/Goals: 4 weeks- goals min-mod assist  -Continue CIR 2. Antithrombotics: -DVT/anticoagulation:SCDs. Check vascular study and check with [facility] to see if can start Lovenox 5/21- Dopplers (-) for DVT- need to see when can start Lovenox 5/23- surgery was 5/17- hopefully [facility]U will let us start 5/27? 5/26- will call to see if can start Lovenox- has been 9 days -antiplatelet therapy: N/A 3. Pain Management:Baclofen 5 mg 3 times daily- will likely need to increase, oxycodone as needed  5/22- not complaining of pain at all- might need to titrate baclofen after a few days.   5/25- will d/w pt need to increase Baclofen? Spasms? 4. Mood:Provide emotional support -antipsychotic agents: N/A 5. Neuropsych: This  patientiscapable of making decisions on herown behalf. 6. Skin/Wound Care:Routine skin checksand turn while in bed every 2 hours while awake- keep on sides at night. 7. Fluids/Electrolytes/Nutrition:Routine in and outs with follow-up chemistries  5/24: BMP stable 8. Neurogenic bowel and bladder. con't foley until get her bowels cleaned out- can remove foley IF pt wants to d/c, and is able to do on own at home- if not, might need foley long term;. [place] [facility] [place] program. Provide education.  5/21- ordered bowel program and explained to pt and daughter in layman's terms- will con't foley for now. Will order Lidocaine jelly for bowel program  5/22- good results in last 24 hours  5/23- BM with bowel program, but also at 0600- will change senokot to 2 tabs daily and not BID- will d/w foley vs caths with pt/family either today or Tuesday  5/25- 2-3 small-mdeium BMs  yesterday but 1 right before bowel program, but not during-Will also start Flomax today and try and remove foley Thursday and do in/out caths.   5/26- remove foley in AM  5/27- remove foley today; wrote orders; good Bms last night 9. GERD. Protonix 10. Hx of prediabetes- check an A1c and monitor as required.   5/21- can't find order- reordered for Monday- CBG 140s on BMP- since on decadron- if goes higher, will monitor more regularly.  5/23- labs in AM- ordered CBC/BMP weekly  11. Hx of gout- this acute event could be a trigger for gout- will monitor.  5/26- no gout Sx's so far 12. Leukocytosis with UTI:  5/24: WBC up to 13.3. Afebrile and asymptomatic. Draw UA/UC given indwelling catheter and trend tomorrow.  5/25- U/A (+) nitrites even though leuks small- will start Keflex 500 mg q8 hours and wait for Cx.    5/26- >100k E coli, but sensitivities are pending.   5/27- Cx shows resistance to Keflex and WBC still 14k- will change to Bactrim DS BID x 7 days  13. Hyperkalemia  5/27- went from 4.2 to 5.2- might be in error will  recheck in AM   LOS: 7 days A FACE TO FACE EVALUATION WAS PERFORMED  Alpheus Stiff 09/07/2019, 8:45 AM

## 2019-09-08 ENCOUNTER — Inpatient Hospital Stay (HOSPITAL_COMMUNITY): Payer: Medicare PPO

## 2019-09-08 ENCOUNTER — Inpatient Hospital Stay (HOSPITAL_COMMUNITY): Payer: Medicare PPO | Admitting: Physical Therapy

## 2019-09-08 ENCOUNTER — Inpatient Hospital Stay (HOSPITAL_COMMUNITY): Payer: Medicare PPO | Admitting: Speech Pathology

## 2019-09-08 ENCOUNTER — Inpatient Hospital Stay (HOSPITAL_COMMUNITY): Payer: Medicare PPO | Admitting: Occupational Therapy

## 2019-09-08 LAB — CBC WITH DIFFERENTIAL/PLATELET
Abs Immature Granulocytes: 0.41 10*3/uL — ABNORMAL HIGH (ref 0.00–0.07)
Basophils Absolute: 0.1 10*3/uL (ref 0.0–0.1)
Basophils Relative: 0 %
Eosinophils Absolute: 0 10*3/uL (ref 0.0–0.5)
Eosinophils Relative: 0 %
HCT: 44.5 % (ref 36.0–46.0)
Hemoglobin: 15.1 g/dL — ABNORMAL HIGH (ref 12.0–15.0)
Immature Granulocytes: 2 %
Lymphocytes Relative: 6 %
Lymphs Abs: 1.2 10*3/uL (ref 0.7–4.0)
MCH: 28.9 pg (ref 26.0–34.0)
MCHC: 33.9 g/dL (ref 30.0–36.0)
MCV: 85.1 fL (ref 80.0–100.0)
Monocytes Absolute: 0.6 10*3/uL (ref 0.1–1.0)
Monocytes Relative: 3 %
Neutro Abs: 17 10*3/uL — ABNORMAL HIGH (ref 1.7–7.7)
Neutrophils Relative %: 89 %
Platelets: 264 10*3/uL (ref 150–400)
RBC: 5.23 MIL/uL — ABNORMAL HIGH (ref 3.87–5.11)
RDW: 12.9 % (ref 11.5–15.5)
WBC: 19.2 10*3/uL — ABNORMAL HIGH (ref 4.0–10.5)
nRBC: 0 % (ref 0.0–0.2)

## 2019-09-08 LAB — BASIC METABOLIC PANEL
Anion gap: 14 (ref 5–15)
BUN: 26 mg/dL — ABNORMAL HIGH (ref 8–23)
CO2: 21 mmol/L — ABNORMAL LOW (ref 22–32)
Calcium: 8.7 mg/dL — ABNORMAL LOW (ref 8.9–10.3)
Chloride: 101 mmol/L (ref 98–111)
Creatinine, Ser: 0.76 mg/dL (ref 0.44–1.00)
GFR calc Af Amer: >60 mL/min (ref >60)
GFR calc non Af Amer: >60 mL/min (ref >60)
Glucose, Bld: 154 mg/dL — ABNORMAL HIGH (ref 70–99)
Potassium: 5.1 mmol/L (ref 3.5–5.1)
Sodium: 136 mmol/L (ref 135–145)

## 2019-09-08 MED ORDER — SODIUM CHLORIDE 0.9 % IV SOLN
1.0000 g | INTRAVENOUS | Status: AC
  Administered 2019-09-08 – 2019-09-14 (×7): 1 g via INTRAVENOUS
  Filled 2019-09-08 (×5): qty 1
  Filled 2019-09-08 (×3): qty 10
  Filled 2019-09-08: qty 1

## 2019-09-08 MED ORDER — LIDOCAINE 5 % EX PTCH
1.0000 | MEDICATED_PATCH | CUTANEOUS | Status: DC
  Administered 2019-09-08 – 2019-09-28 (×21): 1 via TRANSDERMAL
  Filled 2019-09-08 (×20): qty 1

## 2019-09-08 NOTE — Progress Notes (Signed)
Physical Therapy Session Note  Patient Details  Name: Meredith Roach MRN: [id number] Date of Birth: Nov 16, 1940  Today's Date: 09/08/2019 PT Individual Time: 1000-1030 PT Individual Time Calculation (min): 30 min   Short Term Goals: Week 1:  PT Short Term Goal 1 (Week 1): Pt will perform bed mobility with assist x 1 PT Short Term Goal 1 - Progress (Week 1): Met PT Short Term Goal 2 (Week 1): Pt will perform least restrictive transfer with assist x 1 PT Short Term Goal 2 - Progress (Week 1): Met PT Short Term Goal 3 (Week 1): Pt will initiate gait training as safe and able PT Short Term Goal 3 - Progress (Week 1): Progressing toward goal Week 2:  PT Short Term Goal 1 (Week 2): Pt will perform least restrictive transfer with max A consistently PT Short Term Goal 2 (Week 2): Pt will initiate gait training PT Short Term Goal 3 (Week 2): Pt will maintain static sitting balance with close SBA x 5 min Week 3:     Skilled Therapeutic Interventions/Progress Updates:  Ambulation/gait training;Balance/vestibular training;Community reintegration;Discharge planning;Disease management/prevention;DME/adaptive equipment instruction;Functional electrical stimulation;Functional mobility training;Neuromuscular re-education;Pain management;Patient/family education;Psychosocial support;Splinting/orthotics;Stair training;Therapeutic Activities;Therapeutic Exercise;UE/LE Strength taining/ROM;UE/LE Coordination activities;Wheelchair propulsion/positioning PAIN 5/10, states she ws awaiting nursing for lidocaine patch to R cervical region. Treatment to tolerance.  Pt initially supine and agreeable to treatment.  Therapist provided pt w/clean TedHose and cleaned roho cushion per prior therapist request/soiled by urinary incontinence.  Once dry, placed in wc w/temporary linen covering.  Therapist donned tedhose total assist for time efficiency.  Pt able to lift feet for threading of pants by therapist then able to  partially bridge for therapist to raise pants partially, rolls fully w/rails and cues for completion of raising pants.  Pt supine to side  to sit on edge of bed w/max assist using rails  Once in sitting worked on static sitting balance/post and L tendency w/delayed reaction time.  Pt able to self correct post and post/L tendency w/verbal cues.  Once in midline can maintain for several min statically w/hands on knees but when dynamic acitivities attempted pt has difficulty maintaining, occasional mod assist due to post tendency.  sts in Steady w/max assist of 1.  In standing worked on quad and glut sets and cues to promote thoracic extension.   Repeated semistand to stand in steady w/above therex x 5 w/rest between efforts.  Transferred to wc via Steady.  Semis stand to stand to sit w/mod assist to control lowering to wc.  Pt scoots in wc w/cues and min assist.  Positioned comfortably.  Pt then handed over to RN who arrived w/lidocaine patch.       Therapy Documentation Precautions:  Precautions Precautions: Fall, Cervical Precaution Booklet Issued: No Precaution Comments: reviewed cervical precautions Required Braces or Orthoses: Cervical Brace Cervical Brace: Hard collar Restrictions Weight Bearing Restrictions: No    Therapy/Group: Individual Therapy  Callie Fielding, [place] 09/08/2019, 4:41 PM

## 2019-09-08 NOTE — Progress Notes (Signed)
Speech Language Pathology Daily Session Note  Patient Details  Name: Meredith Roach MRN: [id number] Date of Birth: [birth year]  Today's Date: 09/08/2019 SLP Individual Time: 0902-0922 SLP Individual Time Calculation (min): 20 min  Short Term Goals: Week 1: SLP Short Term Goal 1 (Week 1): Patient will consume current diet with minimal overt s/s of aspiration with Mod I for use of swallowing compensatory strategies. SLP Short Term Goal 2 (Week 1): Patient will consume trials of Dys. 3 textures without overt s/s of aspiration over 2 sessions with Mod I prior to upgrade.  Skilled Therapeutic Interventions: Pt was seen for skilled ST targeting dysphagia goals. Pt was Mod I for use of all strategies with regular texture trials and thin liquids. No overt s/sx aspiration observed across textures and pt's mastication was timely, efficient, full oral clearance achieved. Pt also observed to efficiently deglutate her pills with thin from RN without difficulty. Recommend full upgrade to regular textures, continue thins, meds whole with thin. Pt will still required full supervision due to need for heavy assist with self-feeding. Pt has met all goals and no further ST is indicated at this time.      Pain Pain Assessment Pain Scale: 0-10 Pain Score: 0-No pain  Therapy/Group: Individual Therapy  Arbutus Leas 09/08/2019, 7:29 AM

## 2019-09-08 NOTE — Progress Notes (Signed)
[facility] PHYSICAL MEDICINE & REHABILITATION PROGRESS NOTE   Subjective/Complaints:  No Sx's of UTI WBC up to 19k- however denies any Symptoms of feeling bad.   Spasms doing better- doesn't want to increase Baclofen.  Thinks voided- incontinently, but couldn't tell.  Some pain after PT in R shoulder- wants lidoderm patch.    ROS:  Pt denies SOB, abd pain, CP, N/V/C/D, and vision changes  Objective:   No results found. Recent Labs    09/07/19 0606 09/08/19 0644  WBC 14.1* 19.2*  HGB 14.5 15.1*  HCT 44.1 44.5  PLT 266 264   Recent Labs    09/07/19 0606 09/08/19 0644  NA 139 136  K 5.2* 5.1  CL 99 101  CO2 29 21*  GLUCOSE 144* 154*  BUN 24* 26*  CREATININE 0.89 0.76  CALCIUM 8.9 8.7*    Intake/Output Summary (Last 24 hours) at 09/08/2019 1329 Last data filed at 09/08/2019 1300 Gross per 24 hour  Intake 1160 ml  Output 400 ml  Net 760 ml     Physical Exam: Vital Signs Blood pressure (!) 145/65, pulse 72, temperature (!) 97.4 F (36.3 C), resp. rate 17, height 5\' 3"  (1.6 m), weight 76.8 kg, SpO2 98 %.  Physical Exam Nursing noteand vitalsreviewed. Constitutional: wearing collar- being set- up to feed self with wrist orthosis;  NAD HENT: conjugate gaze ; L conjuctiva erythematous compared to R Incision looks good- (+) steristrips Cardiovascular:RRR Respiratory:CTA b/L [id number], NT, ND, (+)BS  Musculoskeletal:  Comments:  RUE- biceps 5-/5 B/L, WE 3/5 on R 2/5 on L; triceps 4-/5 on R; 3/5 on L; grip 2+/5 on R; 2/5 on L; finger abd 1/5 B/L RLE- HF 2+/5, KE 4-/5, DF 1/5, PF 3/5, EHL 0/5 LLE- HF 2/5, KE 3+/5, DF 0/5, PF 3-/5, EHL 1/5 +Left hip tenderness Neurological: Ox3 C4 intact; C5 intact on R; decreased on L; C6-T1 decreased B/L, but absent on L T2-T12- decreased B/L- worse on L L1-L3 nml on R- decreased on L; L4-S1 R nml; L decreased; S3-S5 decreased B/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Ktape on R hand/wrist No  issues on heels or bottom- skin intact Of note, hands more normal temperature Psychiatric:appropriate  Assessment/Plan: 1. 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  Physiatrist is providing close team supervision and 24 hour management of active medical problems listed below.  Physiatrist and rehab team continue to assess barriers to discharge/monitor patient progress toward functional and medical goals  Care Tool:  Bathing        Body parts bathed by helper: Right arm, Left arm, Chest, Abdomen, Front perineal area, Buttocks, Right upper leg, Left upper leg, Right lower leg, Left lower leg, Face     Bathing assist Assist Level: Total Assistance - Patient < 25%     Upper Body Dressing/Undressing Upper body dressing   What is the patient wearing?: Hospital gown only    Upper body assist Assist Level: Dependent - Patient 0%    Lower Body Dressing/Undressing Lower body dressing      What is the patient wearing?: Pants     Lower body assist Assist for lower body dressing: Maximal Assistance - Patient 25 - 49%     Toileting Toileting Toileting Activity did not occur (Clothing management and hygiene only): (pt is incontinent, not safe to sit on a BSC due to poor trunk control)  Toileting assist  Assist for toileting: Total Assistance - Patient < 25%     Transfers Chair/bed transfer  Transfers assist     Chair/bed transfer assist level: Dependent - mechanical lift     Locomotion Ambulation   Ambulation assist   Ambulation activity did not occur: Safety/medical concerns          Walk 10 feet activity   Assist  Walk 10 feet activity did not occur: Safety/medical concerns        Walk 50 feet activity   Assist Walk 50 feet with 2 turns activity did not occur: Safety/medical concerns         Walk 150 feet activity   Assist Walk  150 feet activity did not occur: Safety/medical concerns         Walk 10 feet on uneven surface  activity   Assist Walk 10 feet on uneven surfaces activity did not occur: Safety/medical concerns         Wheelchair     Assist Will patient use wheelchair at discharge?: Yes Type of Wheelchair: (TBD)    Wheelchair assist level: Dependent - Patient 0% Max wheelchair distance: 150'    Wheelchair 50 feet with 2 turns activity    Assist        Assist Level: Dependent - Patient 0%   Wheelchair 150 feet activity     Assist      Assist Level: Dependent - Patient 0%   Blood pressure (!) 145/65, pulse 72, temperature (!) 97.4 F (36.3 C), resp. rate 17, height 5\' 3"  (1.6 m), weight 76.8 kg, SpO2 98 %.  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 Status post ACDF 08/28/2019. Decadron taper. Cervical collar as directed. -need to order PRAFOs to prevent Ankle contractures -still on Decadron taper -patient may shower  5/22- improvement daily bodes well- explained that pt to /daughter  5/23- will discuss foley vs in/out caths with pt/daughter today vs Tuesday;   5/25- will try Flomax and remove foley Thursday to see if can void.  5/28- WBC up to 19- is on PO decadron, but WBC rising  -ELOS/Goals: 4 weeks- goals min-mod assist  -Continue CIR 2. Antithrombotics: -DVT/anticoagulation:SCDs. Check vascular study and check with [facility] to see if can start Lovenox 5/21- Dopplers (-) for DVT- need to see when can start Lovenox 5/23- surgery was 5/17- hopefully [facility] will let us start 5/27? 5/26- will call to see if can start Lovenox- has been 9 days -antiplatelet therapy: N/A 3. Pain Management:Baclofen 5 mg 3 times daily- will likely need to increase, oxycodone as needed  5/22- not complaining of pain at all- might need to  titrate baclofen after a few days.   5/25- will d/w pt need to increase Baclofen? Spasms?  5/28- pt reports spasms doing better- con't current dose- don't increase Baclofen- add lidoderm patch to R shoulder 9am-9pm 4. Mood:Provide emotional support -antipsychotic agents: N/A 5. Neuropsych: This patientiscapable of making decisions on herown behalf. 6. Skin/Wound Care:Routine skin checksand turn while in bed every 2 hours while awake- keep on sides at night. 7. Fluids/Electrolytes/Nutrition:Routine in and outs with follow-up chemistries  5/24: BMP stable 8. Neurogenic bowel and bladder. con't foley until get her bowels cleaned out- can remove foley IF pt wants to d/c, and is able to do on own at home- if not, might need foley long term;. [place] program. Provide education.  5/27- remove foley today; wrote orders; good Bms last night  5.28-  thinks peed, but was incontinent- BM with bowel program last night 9. GERD. Protonix 10. Hx of prediabetes- check an A1c and monitor as required.   5/21- can't find order- reordered for Monday- CBG 140s on BMP- since on decadron- if goes higher, will monitor more regularly.  5/23- labs in AM- ordered CBC/BMP weekly  11. Hx of gout- this acute event could be a trigger for gout- will monitor.  5/26- no gout Sx's so far 12. Leukocytosis with UTI:  5/24: WBC up to 13.3. Afebrile and asymptomatic. Draw UA/UC given indwelling catheter and trend tomorrow.  5/26- >100k E coli, but sensitivities are pending.   5/27- Cx shows resistance to Keflex and WBC still 14k- will change to Bactrim DS BID x 7 days  5/28- changed to Rocephin since WBC up to 19k- Asymptomatic, but WBC is rising dramatically- will do 7 days of IV rocephin and check labs in AM   13. Hyperkalemia  5/27- went from 4.2 to 5.2- might be in error will recheck in AM  5/28- K+ 5.1- will recheck in AM   LOS: 8 days A FACE TO FACE EVALUATION WAS PERFORMED  Meredith Roach 09/08/2019, 1:29 PM

## 2019-09-08 NOTE — Progress Notes (Signed)
Occupational Therapy Session Note  Patient Details  Name: Meredith Roach MRN: [id number] Date of Birth: 05-20-1940  Today's Date: 09/08/2019 OT Individual Time: 1100-1155 OT Individual Time Calculation (min): 55 min   Skilled Therapeutic Interventions/Progress Updates:    Pt greeted in the TIS and premedicated for pain. She stated her ADL needs were already met. Motivated to work on her hands. Tx focus placed on UE strengthening/ROM and functional grasp/release. Started with guiding her through B UE exercises using unweighted bar, ACE wraps applied to both hands to assist with improving grip (and supination abilities during bicep curls). Pt completed 30 reps 2 sets each exercise including bicep curls, chest presses, and forward/backward rows given min facilitation at the Lt elbow. With TIS placed in neutral, transitioned to reaching for bean bags, grasping and then throwing to target. Pt with improved grasp/release, force, and accuracy with Rt vs Lt hand. Verbal and manual cuing for decreasing shoulder hiking tendencies on the Lt side. Education emphasis was placed on mindful releasing with extended fingers. Educated pt how to use a small ball and also her upper thigh to assist her with stretching digits in extension. Pt appreciative. At end of session pt was reclined for safety and comfort in TIS. Call bell within reach, awaiting her lunch tray.   Therapy Documentation Precautions:  Precautions Precautions: Fall, Cervical Precaution Booklet Issued: No Precaution Comments: reviewed cervical precautions Required Braces or Orthoses: Cervical Brace Cervical Brace: Hard collar Restrictions Weight Bearing Restrictions: No Pain: Pain Assessment Pain Scale: 0-10 Pain Score: 0-No pain ADL: ADL Eating: Minimal assistance Grooming: Moderate assistance Upper Body Bathing: Maximal assistance(with hand over hand A to hold washcloth) Lower Body Bathing: Dependent Upper Body Dressing:  Dependent Where Assessed-Upper Body Dressing: Edge of bed Lower Body Dressing: Dependent Where Assessed-Lower Body Dressing: Bed level Toilet Transfer: Unable to assess Tub/Shower Transfer Method: Unable to assess      Therapy/Group: Individual Therapy  Aliyana Dlugosz A Adlyn Fife 09/08/2019, 12:45 PM

## 2019-09-08 NOTE — Progress Notes (Signed)
Speech Language Pathology Discharge Summary  Patient Details  Name: RAVAN SCHLEMMER MRN: [id number] Date of Birth: Jul 23, 1940  Patient has met 3 of 3 long term goals.  Patient to discharge at overall Modified Independent level.  Reasons goals not met: n/a   Clinical Impression/Discharge Summary:   Pt made excellent functional gains and met 3 out of 3 long term goals this admission. Pt is consuming an upgraded regular texture diet with thin liquids and is Mod I for use of safe swallow strategies. Very minimal to no s/sx aspiration are observed with her intake across all textures. Pt has demonstrated improved oropharyngeal swallow function of both thins and solids. Education has been completed with pt and she was with no further questions today. Since all goal have met, no further ST is indicated (inpatient or follow up).  Care Partner:  Caregiver Able to Provide Assistance: Other (comment)(n/a for ST)  Type of Caregiver Assistance: (n/a for ST)  Recommendation:  None  Rationale for SLP Follow Up: (n/a)   Equipment: none   Reasons for discharge: Treatment goals met   Patient/Family Agrees with Progress Made and Goals Achieved: Yes    Arbutus Leas 09/08/2019, 9:25 AM

## 2019-09-08 NOTE — Progress Notes (Addendum)
Occupational Therapy Session Note  Patient Details  Name: Meredith Roach MRN: [id number] Date of Birth: 06-12-1940  Today's Date: 09/08/2019 OT Individual Time: 1300-1345 OT Individual Time Calculation (min): 45 min    Short Term Goals: Week 1:  OT Short Term Goal 1 (Week 1): Pt will be able to maintain static sit EOB with close S to engage more actively in UB self care. OT Short Term Goal 2 (Week 1): Pt will demonstrate improved R hand function to hold wash cloth in hand to wash thighs. OT Short Term Goal 3 (Week 1): Pt will be able to actively use hands to pull sleeves over arms. OT Short Term Goal 4 (Week 1): Pt will be able to rise to stand in [place] lift with mod A to work towards completing [facility] transfers.  Skilled Therapeutic Interventions/Progress Updates:    Pt resting in w/c upon arrival.  OT intervention with focus on BUE function/strengthening. Pt issued small foam cubes and theraputy. Pt instructed on tasks with yellow foam with focus on pincher grasp.  Pt unable to remove lid of tan theraputty or removed from container.  Pt able to mold putty into ball and roll out to practic finger pinching with R hand.  Pt unable to perform task with L hand.  Pt noted with RUE wrist flexion/extension and finger flexion/extension. Pt issued red foam and educated on use for self feeding and oral care.  Pt remained in w/c with all needs within reach and husband present.   Therapy Documentation Precautions:  Precautions Precautions: Fall, Cervical Precaution Booklet Issued: No Precaution Comments: reviewed cervical precautions Required Braces or Orthoses: Cervical Brace Cervical Brace: Hard collar Restrictions Weight Bearing Restrictions: No  Pain:  Pt states her pain "isn't too bad." RN admin meds prior to therapy.   Therapy/Group: Individual Therapy  Leroy Libman 09/08/2019, 2:33 PM

## 2019-09-08 NOTE — Progress Notes (Signed)
Physical Therapy Weekly Progress Note  Patient Details  Name: Meredith Roach MRN: [id number] Date of Birth: [birth year]  Beginning of progress report period: Sep 01, 2019 End of progress report period: Sep 08, 2019  Today's Date: 09/08/2019 PT Individual Time: 0800-0900 PT Individual Time Calculation (min): 60 min   Patient has met 2 of 3 short term goals.  Pt is making good progress towards therapy goals. She has progressed to performing bed mobility with assist x 1 with mod to max A, can perform sit to stand with mod to max A and transfers via use of stedy. Pt is able to perform LE NMR in standing positions but has not been able to initiate gait due to decreased LE strength and control in standing. Pt remains motivated and exhibits good participation in therapy sessions.  Patient continues to demonstrate the following deficits muscle weakness, decreased cardiorespiratoy endurance, abnormal tone, unbalanced muscle activation and decreased coordination and decreased sitting balance, decreased standing balance, decreased postural control and decreased balance strategies and therefore will continue to benefit from skilled PT intervention to increase functional independence with mobility.  Patient progressing toward long term goals..  Continue plan of care.  PT Short Term Goals Week 1:  PT Short Term Goal 1 (Week 1): Pt will perform bed mobility with assist x 1 PT Short Term Goal 1 - Progress (Week 1): Met PT Short Term Goal 2 (Week 1): Pt will perform least restrictive transfer with assist x 1 PT Short Term Goal 2 - Progress (Week 1): Met PT Short Term Goal 3 (Week 1): Pt will initiate gait training as safe and able PT Short Term Goal 3 - Progress (Week 1): Progressing toward goal Week 2:  PT Short Term Goal 1 (Week 2): Pt will perform least restrictive transfer with max A consistently PT Short Term Goal 2 (Week 2): Pt will initiate gait training PT Short Term Goal 3 (Week 2): Pt will  maintain static sitting balance with close SBA x 5 min  Skilled Therapeutic Interventions/Progress Updates:    Pt received seated in bed finishing breakfast with assist from NT, agreeable to PT session. No complaints of pain. Pt is found to be incontinent of large amount of urine, dependent for pericare and brief change with mod A for rolling L/R in supine. Pt is dependent to don pants and shoes at bed level. Supine to sitting EOB with mod A for some LE management and trunk control. Once in sitting pt is CGA to min A for sitting balance due to posterior lean. Sit to stand with max A to stedy. Stedy transfer to TIS w/c. Pt is max A for oral hygiene while seated in w/c at sink. Sit to stand from w/c to stedy with mod A. Sit to stand x 5 reps from perched position on stedy seat to full upright stance with min A, focus on decreased UE support in standing. Pt tends to stand with flexed trunk, able to correct with cues and UE assist on stedy bar. Pt again has urinary incontinence while standing in stedy, large amount of urine voided. Pt is min A to stand from stedy for dependent pericare. Returned pt to sitting EOB. Sit to supine mod A for BLE management. Assisted pt with donning a new brief. Pt left semi-reclined in bed in care of SLP for next session.  Therapy Documentation Precautions:  Precautions Precautions: Fall, Cervical Precaution Booklet Issued: No Precaution Comments: reviewed cervical precautions Required Braces or Orthoses: Cervical Brace Cervical Brace:  Hard collar Restrictions Weight Bearing Restrictions: No Pain: Pain Assessment Pain Scale: 0-10 Pain Score: 0-No pain   Therapy/Group: Individual Therapy   Excell Seltzer, PT, DPT  09/08/2019, 12:24 PM

## 2019-09-09 ENCOUNTER — Inpatient Hospital Stay (HOSPITAL_COMMUNITY): Payer: Medicare PPO | Admitting: Occupational Therapy

## 2019-09-09 LAB — CBC WITH DIFFERENTIAL/PLATELET
Abs Immature Granulocytes: 0.27 10*3/uL — ABNORMAL HIGH (ref 0.00–0.07)
Basophils Absolute: 0 10*3/uL (ref 0.0–0.1)
Basophils Relative: 0 %
Eosinophils Absolute: 0.1 10*3/uL (ref 0.0–0.5)
Eosinophils Relative: 0 %
HCT: 41.8 % (ref 36.0–46.0)
Hemoglobin: 14 g/dL (ref 12.0–15.0)
Immature Granulocytes: 2 %
Lymphocytes Relative: 5 %
Lymphs Abs: 0.9 10*3/uL (ref 0.7–4.0)
MCH: 28.7 pg (ref 26.0–34.0)
MCHC: 33.5 g/dL (ref 30.0–36.0)
MCV: 85.8 fL (ref 80.0–100.0)
Monocytes Absolute: 0.4 10*3/uL (ref 0.1–1.0)
Monocytes Relative: 3 %
Neutro Abs: 15.1 10*3/uL — ABNORMAL HIGH (ref 1.7–7.7)
Neutrophils Relative %: 90 %
Platelets: 262 10*3/uL (ref 150–400)
RBC: 4.87 MIL/uL (ref 3.87–5.11)
RDW: 13 % (ref 11.5–15.5)
WBC: 16.7 10*3/uL — ABNORMAL HIGH (ref 4.0–10.5)
nRBC: 0 % (ref 0.0–0.2)

## 2019-09-09 LAB — BASIC METABOLIC PANEL
Anion gap: 9 (ref 5–15)
BUN: 24 mg/dL — ABNORMAL HIGH (ref 8–23)
CO2: 25 mmol/L (ref 22–32)
Calcium: 8.4 mg/dL — ABNORMAL LOW (ref 8.9–10.3)
Chloride: 100 mmol/L (ref 98–111)
Creatinine, Ser: 0.86 mg/dL (ref 0.44–1.00)
GFR calc Af Amer: >60 mL/min (ref >60)
GFR calc non Af Amer: >60 mL/min (ref >60)
Glucose, Bld: 188 mg/dL — ABNORMAL HIGH (ref 70–99)
Potassium: 5.3 mmol/L — ABNORMAL HIGH (ref 3.5–5.1)
Sodium: 134 mmol/L — ABNORMAL LOW (ref 135–145)

## 2019-09-09 LAB — LACTIC ACID, PLASMA: Lactic Acid, Venous: 2.3 mmol/L (ref 0.5–1.9)

## 2019-09-09 MED ORDER — SODIUM ZIRCONIUM CYCLOSILICATE 10 G PO PACK
10.0000 g | PACK | Freq: Once | ORAL | Status: AC
  Administered 2019-09-09: 10 g via ORAL
  Filled 2019-09-09: qty 1

## 2019-09-09 NOTE — Progress Notes (Signed)
Occupational Therapy Session Note  Patient Details  Name: Meredith Roach MRN: [id number] Date of Birth: January 13, 1941  Today's Date: 09/09/2019 OT Individual Time: [id number] Individual Time Calculation (min): 53 min    Short Term Goals: Week 1:  OT Short Term Goal 1 (Week 1): Pt will be able to maintain static sit EOB with close S to engage more actively in UB self care. OT Short Term Goal 2 (Week 1): Pt will demonstrate improved R hand function to hold wash cloth in hand to wash thighs. OT Short Term Goal 3 (Week 1): Pt will be able to actively use hands to pull sleeves over arms. OT Short Term Goal 4 (Week 1): Pt will be able to rise to stand in [place] lift with mod A to work towards completing [facility] transfers.  Skilled Therapeutic Interventions/Progress Updates:    Patient in bed, alert and ready for therapy session, she denies pain at this time.  Cervical collar in place.  Wet brief changed - dependent, LB dressing max A.  Rolling in bed with min/mod A.  Side lying to sitting edge of bed with mod/max A.  Min A to maintain seated balance edge of bed.  Sit to stand in stedy min A.  Mod A to position/scoot backward in TIS w/c.  Completed seated trunk control activities with forward reach, lateral leans, pelvic tilt and seated balance.  Reaching activities with passing items hand to hand.  AROM activities with focus on scapular mobility and positioning.  Reviewed trunk and UB activities that she can repeat on her own - she demonstrated good understanding.  She remained seated in w/c at close of session, seat belt alarm applied and call bell fastened to shirt.   Therapy Documentation Precautions:  Precautions Precautions: Fall, Cervical Precaution Booklet Issued: No Precaution Comments: reviewed cervical precautions Required Braces or Orthoses: Cervical Brace Cervical Brace: Hard collar Restrictions Weight Bearing Restrictions: No  Therapy/Group: Individual Therapy  Carlos Levering 09/09/2019, 7:34 AM

## 2019-09-09 NOTE — Progress Notes (Addendum)
[place] PHYSICAL MEDICINE & REHABILITATION PROGRESS NOTE   Subjective/Complaints:  No issues overnite , pt with OT, min A xfer, pt mobility still is heavier assist   ROS:  Pt denies SOB, abd pain, CP, N/V/C/D, and vision changes  Objective:   No results found. Recent Labs    09/08/19 0644 09/09/19 0918  WBC 19.2* 16.7*  HGB 15.1* 14.0  HCT 44.5 41.8  PLT 264 262   Recent Labs    09/07/19 0606 09/08/19 0644  NA 139 136  K 5.2* 5.1  CL 99 101  CO2 29 21*  GLUCOSE 144* 154*  BUN 24* 26*  CREATININE 0.89 0.76  CALCIUM 8.9 8.7*    Intake/Output Summary (Last 24 hours) at 09/09/2019 1010 Last data filed at 09/09/2019 0900 Gross per 24 hour  Intake 1000 ml  Output --  Net 1000 ml     Physical Exam: Vital Signs Blood pressure (!) 116/54, pulse 66, temperature 98.2 F (36.8 C), temperature source Oral, resp. rate 18, height 5\' 3"  (1.6 m), weight 76.8 kg, SpO2 97 %.  Physical Exam  General: No acute distress Mood and affect are appropriate Heart: Regular rate and rhythm no rubs murmurs or extra sounds Lungs: Clear to auscultation, breathing unlabored, no rales or wheezes Abdomen: Positive bowel sounds, soft nontender to palpation, nondistended Extremities: No clubbing, cyanosis, or edema  RUE- biceps 5-/5 B/L, WE 3/5 on R 2/5 on L; triceps 4-/5 on R; 3/5 on L; grip 2+/5 on R; 2/5 on L; finger abd 1/5 B/L RLE- HF 2+/5, KE 4-/5, DF 1/5, PF 3/5,  LLE- HF 2/5, KE 3+/5, DF 0/5, PF 3-/5 +Left hip tenderness Neurological: Ox3 C4 intact; C5 intact on R; decreased on L; C6-T1 decreased B/L, but absent on L  Can ID LT on feet and knees  bilaterally   Skin: Skin iswarmand dry.Ktape on R hand/wrist IV site CDI  Of note, hands more normal temperature Psychiatric:appropriate  Assessment/Plan: 1. Functional deficits secondary to C3 ASIA C quadriplegia which require 3+ hours per day of interdisciplinary therapy in a comprehensive inpatient rehab  setting.  Physiatrist is providing close team supervision and 24 hour management of active medical problems listed below.  Physiatrist and rehab team continue to assess barriers to discharge/monitor patient progress toward functional and medical goals  Care Tool:  Bathing        Body parts bathed by helper: Right arm, Left arm, Chest, Abdomen, Front perineal area, Buttocks, Right upper leg, Left upper leg, Right lower leg, Left lower leg, Face     Bathing assist Assist Level: Total Assistance - Patient < 25%     Upper Body Dressing/Undressing Upper body dressing   What is the patient wearing?: Hospital gown only    Upper body assist Assist Level: Dependent - Patient 0%    Lower Body Dressing/Undressing Lower body dressing      What is the patient wearing?: Pants     Lower body assist Assist for lower body dressing: Maximal Assistance - Patient 25 - 49%     Toileting Toileting Toileting Activity did not occur (Clothing management and hygiene only): (pt is incontinent, not safe to sit on a BSC due to poor trunk control)  Toileting assist Assist for toileting: Total Assistance - Patient < 25%     Transfers Chair/bed transfer  Transfers assist     Chair/bed transfer assist level: Dependent - mechanical lift     Locomotion Ambulation   Ambulation assist   Ambulation activity  did not occur: Safety/medical concerns          Walk 10 feet activity   Assist  Walk 10 feet activity did not occur: Safety/medical concerns        Walk 50 feet activity   Assist Walk 50 feet with 2 turns activity did not occur: Safety/medical concerns         Walk 150 feet activity   Assist Walk 150 feet activity did not occur: Safety/medical concerns         Walk 10 feet on uneven surface  activity   Assist Walk 10 feet on uneven surfaces activity did not occur: Safety/medical concerns         Wheelchair     Assist Will patient use wheelchair at  discharge?: Yes Type of Wheelchair: (TBD)    Wheelchair assist level: Dependent - Patient 0% Max wheelchair distance: 150'    Wheelchair 50 feet with 2 turns activity    Assist        Assist Level: Dependent - Patient 0%   Wheelchair 150 feet activity     Assist      Assist Level: Dependent - Patient 0%   Blood pressure (!) 116/54, pulse 66, temperature 98.2 F (36.8 C), temperature source Oral, resp. rate 18, height 5\' 3"  (1.6 m), weight 76.8 kg, SpO2 97 %.  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 Status post ACDF 08/28/2019. Decadron taper. Cervical collar as directed. -need to order PRAFOs to prevent Ankle contractures -still on Decadron taper -patient may shower  5/22- improvement daily bodes well- explained that pt to /daughter  5/23- will discuss foley vs in/out caths with pt/daughter today vs Tuesday;   5/25- will try Flomax and remove foley Thursday to see if can void.  5/28- WBC up to 19- is on PO decadron, but WBC rising  -ELOS/Goals: 4 weeks- goals min-mod assist  -Continue CIR 2. Antithrombotics: -DVT/anticoagulation:SCDs. Check vascular study and check with [facility] to see if can start Lovenox 5/21- Dopplers (-) for DVT- need to see when can start Lovenox 5/23- surgery was 5/17- hopefully NSU will let us start 5/27? 5/26- will call to see if can start Lovenox- has been 9 days -antiplatelet therapy: N/A 3. Pain Management:Baclofen 5 mg 3 times daily- will likely need to increase, oxycodone as needed  5/22- not complaining of pain at all- might need to titrate baclofen after a few days.   5/25- will d/w pt need to increase Baclofen? Spasms?  5/28- pt reports spasms doing better- con't current dose- don't increase Baclofen- add lidoderm patch to R shoulder 9am-9pm 4. Mood:Provide emotional  support -antipsychotic agents: N/A 5. Neuropsych: This patientiscapable of making decisions on herown behalf. 6. Skin/Wound Care:Routine skin checksand turn while in bed every 2 hours while awake- keep on sides at night. 7. Fluids/Electrolytes/Nutrition:Routine in and outs with follow-up chemistries  5/24: BMP stable 8. Neurogenic bowel and bladder. con't foley until get her bowels cleaned out- can remove foley IF pt wants to d/c, and is able to do on own at home- if not, might need foley long term;. [place] program. Provide education.  5/27- remove foley today; wrote orders; good Bms last night  5.28- thinks peed, but was incontinent- BM with bowel program last night Incont of urine check PVR 9. GERD. Protonix 10. Hx of prediabetes- check an A1c and monitor as required.   5/21- can't find order- reordered for Monday- CBG 140s on BMP-  since on decadron- if goes higher, will monitor more regularly.  5/23- labs in AM- ordered CBC/BMP weekly  11. Hx of gout- this acute event could be a trigger for gout- will monitor.  5/26- no gout Sx's so far 12. Leukocytosis with UTI:  5/24: WBC up to 13.3. Afebrile and asymptomatic. Draw UA/UC given indwelling catheter and trend tomorrow.  5/26- >100k E coli, but sensitivities are pending.   5/27- Cx shows resistance to Keflex and WBC still 14k- will change to Bactrim DS BID x 7 days  5/28- changed to Rocephin since WBC up to 19k-down to 16.7 K   7 days of IV rocephin and check labs in AM E coli sensitive to rocephin  Minimal elevation of serum lactic acid, clinically bright , alert and participating in therapy , no clinical concerns for sepsis, will monitor  13. Hyperkalemia  5/27- went from 4.2 to 5.2- might be in error will recheck in AM  5/28- K+ 5.1- will recheck in AM   LOS: 9 days A FACE TO [place] E Shriyans Kuenzi 09/09/2019, 10:10 AM

## 2019-09-10 ENCOUNTER — Inpatient Hospital Stay (HOSPITAL_COMMUNITY): Payer: Medicare PPO

## 2019-09-10 MED ORDER — DEXAMETHASONE 0.5 MG PO TABS
0.5000 mg | ORAL_TABLET | ORAL | Status: AC
  Administered 2019-09-15 – 2019-09-16 (×2): 0.5 mg via ORAL
  Filled 2019-09-10 (×2): qty 1

## 2019-09-10 MED ORDER — DEXAMETHASONE 4 MG PO TABS
2.0000 mg | ORAL_TABLET | Freq: Two times a day (BID) | ORAL | Status: AC
  Administered 2019-09-12 (×2): 2 mg via ORAL
  Filled 2019-09-10 (×2): qty 1

## 2019-09-10 MED ORDER — DEXAMETHASONE 4 MG PO TABS
2.0000 mg | ORAL_TABLET | Freq: Three times a day (TID) | ORAL | Status: AC
  Administered 2019-09-11 (×3): 2 mg via ORAL
  Filled 2019-09-10 (×3): qty 1

## 2019-09-10 MED ORDER — DEXAMETHASONE 0.5 MG PO TABS
0.5000 mg | ORAL_TABLET | Freq: Two times a day (BID) | ORAL | Status: AC
  Administered 2019-09-14 (×2): 0.5 mg via ORAL
  Filled 2019-09-10 (×2): qty 1

## 2019-09-10 MED ORDER — DEXAMETHASONE 0.5 MG PO TABS
1.0000 mg | ORAL_TABLET | Freq: Two times a day (BID) | ORAL | Status: AC
  Administered 2019-09-13 (×2): 1 mg via ORAL
  Filled 2019-09-10 (×2): qty 2

## 2019-09-10 NOTE — Progress Notes (Signed)
Physical Therapy Session Note  Patient Details  Name: Meredith Roach MRN: [id number] Date of Birth: 1941/04/10  Today's Date: 09/10/2019 PT Individual Time: 1400-1430 PT Individual Time Calculation (min): 30 min   Short Term Goals: Week 2:  PT Short Term Goal 1 (Week 2): Pt will perform least restrictive transfer with max A consistently PT Short Term Goal 2 (Week 2): Pt will initiate gait training PT Short Term Goal 3 (Week 2): Pt will maintain static sitting balance with close SBA x 5 min  Skilled Therapeutic Interventions/Progress Updates:     Patient in bed with c-collar donned with her daughter in the room upon PT arrival. Patient alert and agreeable to PT session. Patient denied pain during session. Adjusted C-collar during session for proper fit with total A. Educated on proper placement and fit to patient and her daughter.   Therapeutic Activity: Bed Mobility: Patient performed rolling R/L with min A to don shorts with total A from her daughter. She performed supine to sit with mod A and a second person SBA for safety. Provided verbal cues for log rolling and pushing up to her elbow then her hand to sit up.  Neuromuscular Re-ed: Patient performed the following sitting and standing activities: -sitting balance x2 min with close supervision EOB, performed reaching in sitting with B UEs, unable to extend L UE fulling for reaching, performed leans outside of BOS throughout -sit to/from stand in the [place] x3 with min A +2 x2 and unable to stand x1 due to changing in UE position, attempted to have patient pull up on bar with her hands, however, unable to stand due to decreased grip strength, tolerated standing balance x1-2 min x2 in [place] with cues for erect posture and hip extensor activation -B knee flexion/extension through full range AROM 2x5 -alternating hip flexion with manual facilitation for full ROM in sitting 2x5  -L heel cord stretch 2x30 sec (tought patient's daughter about  performing stretec at least 3x per day for improved ROM)  Patient in TIS w/c with c-collar donned with her daughter in the room at end of session with breaks locked, seat belt alarm set, and all needs within reach.    Therapy Documentation Precautions:  Precautions Precautions: Fall, Cervical Precaution Booklet Issued: No Precaution Comments: reviewed cervical precautions Required Braces or Orthoses: Cervical Brace Cervical Brace: Hard collar Restrictions Weight Bearing Restrictions: No    Therapy/Group: Individual Therapy  Valor Quaintance L Juandiego Kolenovic PT, DPT  09/10/2019, 4:34 PM

## 2019-09-10 NOTE — Progress Notes (Signed)
[place] PHYSICAL MEDICINE & REHABILITATION PROGRESS NOTE   Subjective/Complaints:   No issues overnite Pt without new problems, does stretching exercises   ROS:  Pt denies SOB, abd pain, CP, N/V/C/D, and vision changes  Objective:   No results found. Recent Labs    09/08/19 0644 09/09/19 0918  WBC 19.2* 16.7*  HGB 15.1* 14.0  HCT 44.5 41.8  PLT 264 262   Recent Labs    09/08/19 0644 09/09/19 0918  NA 136 134*  K 5.1 5.3*  CL 101 100  CO2 21* 25  GLUCOSE 154* 188*  BUN 26* 24*  CREATININE 0.76 0.86  CALCIUM 8.7* 8.4*    Intake/Output Summary (Last 24 hours) at 09/10/2019 0718 Last data filed at 09/10/2019 0645 Gross per 24 hour  Intake 600 ml  Output 2100 ml  Net -1500 ml     Physical Exam: Vital Signs Blood pressure (!) 124/56, pulse 60, temperature (!) 97.5 F (36.4 C), resp. rate 16, height 5\' 3"  (1.6 m), weight 76.8 kg, SpO2 97 %.  Physical Exam  General: No acute distress Mood and affect are appropriate Heart: Regular rate and rhythm no rubs murmurs or extra sounds Lungs: Clear to auscultation, breathing unlabored, no rales or wheezes Abdomen: Positive bowel sounds, soft nontender to palpation, nondistended Extremities: No clubbing, cyanosis, or edema  RUE- biceps 5-/5 B/L, WE 3/5 on R 2/5 on L; triceps 4-/5 on R; 3/5 on L; grip 2+/5 on R; 2/5 on L; finger abd 1/5 B/L RLE- HF 2+/5, KE 4-/5, DF 1/5, PF 3/5,  LLE- HF 2/5, KE 3+/5, DF 0/5, PF 3-/5 +Left hip tenderness Neurological: Ox3 C4 intact; C5 intact on R; decreased on L; C6-T1 decreased B/L, but absent on L  Can ID LT on feet and knees  bilaterally   Skin: Skin iswarmand dry.Ktape on R hand/wrist IV site CDI  Of note, hands more normal temperature Psychiatric:appropriate  Assessment/Plan: 1. 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  Physiatrist is providing  close team supervision and 24 hour management of active medical problems listed below.  Physiatrist and rehab team continue to assess barriers to discharge/monitor patient progress toward functional and medical goals  Care Tool:  Bathing        Body parts bathed by helper: Right arm, Left arm, Chest, Abdomen, Front perineal area, Buttocks, Right upper leg, Left upper leg, Right lower leg, Left lower leg, Face     Bathing assist Assist Level: Total Assistance - Patient < 25%     Upper Body Dressing/Undressing Upper body dressing   What is the patient wearing?: Hospital gown only    Upper body assist Assist Level: Dependent - Patient 0%    Lower Body Dressing/Undressing Lower body dressing      What is the patient wearing?: Pants     Lower body assist Assist for lower body dressing: Maximal Assistance - Patient 25 - 49%     Toileting Toileting Toileting Activity did not occur (Clothing management and hygiene only): (pt is incontinent, not safe to sit on a BSC due to poor trunk control)  Toileting assist Assist for toileting: Total Assistance - Patient < 25%     Transfers Chair/bed transfer  Transfers assist     Chair/bed transfer assist level: Dependent - mechanical lift     Locomotion Ambulation   Ambulation assist   Ambulation activity did not occur: Safety/medical concerns  Walk 10 feet activity   Assist  Walk 10 feet activity did not occur: Safety/medical concerns        Walk 50 feet activity   Assist Walk 50 feet with 2 turns activity did not occur: Safety/medical concerns         Walk 150 feet activity   Assist Walk 150 feet activity did not occur: Safety/medical concerns         Walk 10 feet on uneven surface  activity   Assist Walk 10 feet on uneven surfaces activity did not occur: Safety/medical concerns         Wheelchair     Assist Will patient use wheelchair at discharge?: Yes Type of Wheelchair:  (TBD)    Wheelchair assist level: Dependent - Patient 0% Max wheelchair distance: 150'    Wheelchair 50 feet with 2 turns activity    Assist        Assist Level: Dependent - Patient 0%   Wheelchair 150 feet activity     Assist      Assist Level: Dependent - Patient 0%   Blood pressure (!) 124/56, pulse 60, temperature (!) 97.5 F (36.4 C), resp. rate 16, height 5\' 3"  (1.6 m), weight 76.8 kg, SpO2 97 %.  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 Status post ACDF 08/28/2019. Decadron taper. Cervical collar as directed. -need to order PRAFOs to prevent Ankle contractures -still on Decadron taper -patient may shower  5/22- improvement daily bodes well- explained that pt to /daughter  5/23- will discuss foley vs in/out caths with pt/daughter today vs Tuesday;   5/25- will try Flomax and remove foley Thursday to see if can void.  5/28- WBC up to 19- is on PO decadron, but WBC rising  -ELOS/Goals: 4 weeks- goals min-mod assist  -Continue CIR 2. Antithrombotics: -DVT/anticoagulation:SCDs. Check vascular study and check with [facility] to see if can start Lovenox 5/21- Dopplers (-) for DVT- need to see when can start Lovenox 5/23- surgery was 5/17- hopefully NSU will let us start 5/27? 5/26- will call to see if can start Lovenox- has been 9 days -antiplatelet therapy: N/A 3. Pain Management:Baclofen 5 mg 3 times daily- will likely need to increase, oxycodone as needed  5/22- not complaining of pain at all- might need to titrate baclofen after a few days.   5/25- will d/w pt need to increase Baclofen? Spasms?  5/28- pt reports spasms doing better- con't current dose- don't increase Baclofen- add lidoderm patch to R shoulder 9am-9pm 4. Mood:Provide emotional support -antipsychotic agents: N/A 5. Neuropsych: This  patientiscapable of making decisions on herown behalf. 6. Skin/Wound Care:Routine skin checksand turn while in bed every 2 hours while awake- keep on sides at night. 7. Fluids/Electrolytes/Nutrition:Routine in and outs with follow-up chemistries  5/24: BMP stable 8. Neurogenic bowel and bladder. con't foley until get her bowels cleaned out- can remove foley IF pt wants to d/c, and is able to do on own at home- if not, might need foley long term;. [place] program. Provide education.  5/27- remove foley today; wrote orders; good Bms last night  5.28- thinks peed, but was incontinent- BM with bowel program last night Incont of urine check PVR 9. GERD. Protonix 10. Hx of prediabetes- check an A1c and monitor as required.   5/21- can't find order- reordered for Monday- CBG 140s on BMP- since on decadron- if goes higher, will monitor more regularly.  5/23- labs in AM- ordered  CBC/BMP weekly  11. Hx of gout- this acute event could be a trigger for gout- will monitor.  5/26- no gout Sx's so far 12. Leukocytosis with UTI:  5/24: WBC up to 13.3. Afebrile and asymptomatic. Draw UA/UC given indwelling catheter and trend tomorrow.  5/26- >100k E coli, but sensitivities are pending.   5/27- Cx shows resistance to Keflex and WBC still 14k- will change to Bactrim DS BID x 7 days  5/28- changed to Rocephin since WBC up to 19k-down to 16.7 K   7 days of IV rocephin and check labs in AM E coli sensitive to rocephin  Minimal elevation of serum lactic acid, clinically bright , alert and participating in therapy , no clinical concerns for sepsis, will monitor  13. Hyperkalemia  5/27- went from 4.2 to 5.2- might be in error will recheck in AM  5/28- K+ 5.1- will recheck in AM 13.  Hyper K mild , received Lokelma yesterday, no obvious etiology  BMET in am   LOS: 10 days A FACE TO [place] E Tamarcus Condie 09/10/2019, 7:18 AM

## 2019-09-11 ENCOUNTER — Inpatient Hospital Stay (HOSPITAL_COMMUNITY): Payer: Medicare PPO | Admitting: Occupational Therapy

## 2019-09-11 ENCOUNTER — Inpatient Hospital Stay (HOSPITAL_COMMUNITY): Payer: Medicare PPO

## 2019-09-11 ENCOUNTER — Inpatient Hospital Stay (HOSPITAL_COMMUNITY): Payer: Medicare PPO | Admitting: Physical Therapy

## 2019-09-11 LAB — CBC WITH DIFFERENTIAL/PLATELET
Abs Immature Granulocytes: 0.23 10*3/uL — ABNORMAL HIGH (ref 0.00–0.07)
Basophils Absolute: 0 10*3/uL (ref 0.0–0.1)
Basophils Relative: 0 %
Eosinophils Absolute: 0 10*3/uL (ref 0.0–0.5)
Eosinophils Relative: 0 %
HCT: 43.2 % (ref 36.0–46.0)
Hemoglobin: 14.2 g/dL (ref 12.0–15.0)
Immature Granulocytes: 2 %
Lymphocytes Relative: 10 %
Lymphs Abs: 1 10*3/uL (ref 0.7–4.0)
MCH: 28.8 pg (ref 26.0–34.0)
MCHC: 32.9 g/dL (ref 30.0–36.0)
MCV: 87.6 fL (ref 80.0–100.0)
Monocytes Absolute: 0.7 10*3/uL (ref 0.1–1.0)
Monocytes Relative: 7 %
Neutro Abs: 8.5 10*3/uL — ABNORMAL HIGH (ref 1.7–7.7)
Neutrophils Relative %: 81 %
Platelets: 223 10*3/uL (ref 150–400)
RBC: 4.93 MIL/uL (ref 3.87–5.11)
RDW: 13.1 % (ref 11.5–15.5)
WBC: 10.5 10*3/uL (ref 4.0–10.5)
nRBC: 0 % (ref 0.0–0.2)

## 2019-09-11 LAB — BASIC METABOLIC PANEL
Anion gap: 7 (ref 5–15)
BUN: 25 mg/dL — ABNORMAL HIGH (ref 8–23)
CO2: 28 mmol/L (ref 22–32)
Calcium: 8.4 mg/dL — ABNORMAL LOW (ref 8.9–10.3)
Chloride: 102 mmol/L (ref 98–111)
Creatinine, Ser: 0.84 mg/dL (ref 0.44–1.00)
GFR calc Af Amer: >60 mL/min (ref >60)
GFR calc non Af Amer: >60 mL/min (ref >60)
Glucose, Bld: 148 mg/dL — ABNORMAL HIGH (ref 70–99)
Potassium: 5.1 mmol/L (ref 3.5–5.1)
Sodium: 137 mmol/L (ref 135–145)

## 2019-09-11 MED ORDER — TAMSULOSIN HCL 0.4 MG PO CAPS
0.8000 mg | ORAL_CAPSULE | Freq: Every day | ORAL | Status: DC
  Administered 2019-09-11 – 2019-09-28 (×18): 0.8 mg via ORAL
  Filled 2019-09-11 (×21): qty 2

## 2019-09-11 NOTE — Progress Notes (Signed)
Physical Therapy Session Note  Patient Details  Name: ESTELLENE SANMIGUEL MRN: [id number] Date of Birth: 1941/03/22  Today's Date: 09/11/2019 PT Individual Time: 1400-1430 PT Individual Time Calculation (min): 30 min   Short Term Goals: Week 2:  PT Short Term Goal 1 (Week 2): Pt will perform least restrictive transfer with max A consistently PT Short Term Goal 2 (Week 2): Pt will initiate gait training PT Short Term Goal 3 (Week 2): Pt will maintain static sitting balance with close SBA x 5 min  Skilled Therapeutic Interventions/Progress Updates:    Pt received seated in bed, agreeable to PT session. No complaints of pain. Semi-reclined in bed to sitting EOB with mod A for some LE management and trunk control. Pt is CGA for sitting balance EOB while donning shoes dependently for time conservation. Sit to stand with mod A to [place]. Pt unable to take any steps in standing with PFRW due to ongoing LE weakness. Pt is able to lift RLE up from the ground, unable to lift LLE. Stedy transfer bed to TIS w/c with min A to stand to stedy. Sit to stand to [place] from [place] with min A. Once in standing attempted to have pt perform mini-squats. Pt able to squat down but unable to return to upright stance, requires total A x 2 to return safely back to sitting in w/c. Pt left semi-reclined in TIS w/c in room with needs in reach, quick release belt and chair alarm in place.  Therapy Documentation Precautions:  Precautions Precautions: Fall, Cervical Precaution Booklet Issued: No Precaution Comments: reviewed cervical precautions Required Braces or Orthoses: Cervical Brace Cervical Brace: Hard collar Restrictions Weight Bearing Restrictions: No    Therapy/Group: Individual Therapy   Excell Seltzer, PT, DPT  09/11/2019, 3:49 PM

## 2019-09-11 NOTE — Progress Notes (Signed)
Occupational Therapy Weekly Progress Note  Patient Details  Name: Meredith Roach MRN: [id number] Date of Birth: 30-May-1940  Beginning of progress report period: Sep 01, 2019 End of progress report period: Sep 11, 2019  Patient has met 3 of 4 short term goals. Pt is making steady progress with BUE functional use, sitting balance, sit<>stand in [place], and standing balance in [place].  Pt with increased BUE proximal AROM and strength and RUE gross grasp. Pt requires mod/max A for bathing/dressing tasks and mod A for sit<>stand with Stedy.Pt using RUE for self feeding with universal cuff.  Patient continues to demonstrate the following deficits: muscle weakness and muscle joint tightness, unbalanced muscle activation and decreased coordination and decreased sitting balance, decreased standing balance and decreased postural control and therefore will continue to benefit from skilled OT intervention to enhance overall performance with BADL.  Patient progressing toward long term goals..  Continue plan of care.  OT Short Term Goals Week 1:  OT Short Term Goal 1 (Week 1): Pt will be able to maintain static sit EOB with close S to engage more actively in UB self care. OT Short Term Goal 1 - Progress (Week 1): Met OT Short Term Goal 2 (Week 1): Pt will demonstrate improved R hand function to hold wash cloth in hand to wash thighs. OT Short Term Goal 2 - Progress (Week 1): Met OT Short Term Goal 3 (Week 1): Pt will be able to actively use hands to pull sleeves over arms. OT Short Term Goal 3 - Progress (Week 1): Progressing toward goal OT Short Term Goal 4 (Week 1): Pt will be able to rise to stand in [place] lift with mod A to work towards completing [facility] transfers. OT Short Term Goal 4 - Progress (Week 1): Met Week 2:  OT Short Term Goal 1 (Week 2): Pt will be able to actively use hands to pull sleeves over arms. OT Short Term Goal 2 (Week 2): Pt will perform sit<>stand with RW with mod A in preparation  for pulling pants over hips OT Short Term Goal 3 (Week 2): Pt will use her LUE/hand with mod A particiipating in bathng tasks OT Short Term Goal 4 (Week 2): Pt will perform 1/3 toileting tasks with max A   Leroy Libman 09/11/2019, 2:50 PM

## 2019-09-11 NOTE — Progress Notes (Signed)
Occupational Therapy Session Note  Patient Details  Name: Meredith Roach MRN: [id number] Date of Birth: Mar 20, 1941  Today's Date: 09/11/2019 OT Individual Time: [id number] Individual Time Calculation (min): 56 min    Short Term Goals: Week 1:  OT Short Term Goal 1 (Week 1): Pt will be able to maintain static sit EOB with close S to engage more actively in UB self care. OT Short Term Goal 2 (Week 1): Pt will demonstrate improved R hand function to hold wash cloth in hand to wash thighs. OT Short Term Goal 3 (Week 1): Pt will be able to actively use hands to pull sleeves over arms. OT Short Term Goal 4 (Week 1): Pt will be able to rise to stand in [place] lift with mod A to work towards completing [facility] transfers.  Skilled Therapeutic Interventions/Progress Updates:    Pt resting in bed upon arrival.  Daughter present.  Pt's daughter had assisted pt with UB dressing and donned Ted hose prior to therapist arrival. Pt's daughter assisted therapist with assisting pt don pants. Pt able to lift BLE to facilitate threading pants. Pt requires mod A for rolling R/L using bed rails. Pt able to move BLE off EOB once in sidelying.  Sidelying>sit EOB with max A. Sitting balance with CGA.  Pt transferred to w/c with Stedy; sit<>stand with mod A. Pt transitioned to Day Room and engaged in BUE table tasks with focus on RUE/hand.  Pt with improved functional use of RUE proximal>distal. Pt used tan theraputty with R hand for gross grasp and pincher grasp strengthening.  Pt also grasped large pegs and placed in peg board. L hand with trace flexion. Pt returned to room and remained seated in TIS w/c with all needs within reach and belt alarm activated.   Therapy Documentation Precautions:  Precautions Precautions: Fall, Cervical Precaution Booklet Issued: No Precaution Comments: reviewed cervical precautions Required Braces or Orthoses: Cervical Brace Cervical Brace: Hard collar Restrictions Weight Bearing  Restrictions: No Pain: Pt denies pain this morning  Therapy/Group: Individual Therapy  Leroy Libman 09/11/2019, 9:58 AM

## 2019-09-11 NOTE — Progress Notes (Signed)
Occupational Therapy Session Note  Patient Details  Name: Meredith Roach MRN: [id number] Date of Birth: 1941-01-13  Today's Date: 09/11/2019 OT Individual Time: 1105-1204 OT Individual Time Calculation (min): 59 min   Skilled Therapeutic Interventions/Progress Updates:    Pt greeted in TIS with no c/o pain. Dtr Benita Gutter present during session, helpful and involved. Started with sit<stands in [place] with both hands ACE wrapped to the bar for NMR. Pt need Mod A x2 to boost from w/c into [place]. Once paddles were lowered, she needed Min Ax2 for standing fully x5 reps, vcs for upright posture with use of mirror for visual feedback. She completed another set, returning 1st to w/c and then boosting up again. She was able to power up from [facility] paddles with supervision assist and increased time x5 reps! We celebrated! Transitioned to seated NMR, pts hands ACE wrapped to 1# bar. Guided her through chest presses, forward/backward rows, bicep curls, and straight arm raises (with min facilitation bilaterally), x10 reps 2 sets each exercise. Transitioned to seated core strengthening while listening to music, pt sitting unsupported with Min-Mod A for stability while rotating trunk in beat to favorite music, holding hands with dtr and moving UEs in beat to music. Also worked on forward hand claps, toe taps, and seated marches with facilitation for all LE exercises. At end of session pt reported feeling pleasantly fatigued, affect visibly brightened. Left her reclined in TIS for pressure relief, all needs within reach, safety belt fastened, dtr still present.    Therapy Documentation Precautions:  Precautions Precautions: Fall, Cervical Precaution Booklet Issued: No Precaution Comments: reviewed cervical precautions Required Braces or Orthoses: Cervical Brace Cervical Brace: Hard collar Restrictions Weight Bearing Restrictions: No Pain: Pain Assessment Pain Scale: 0-10 Pain Score: 0-No pain Pain Type: Acute  pain Pain Location: Neck Pain Orientation: Anterior Pain Descriptors / Indicators: Aching Pain Frequency: Intermittent Pain Onset: On-going Patients Stated Pain Goal: 2 Pain Intervention(s): Medication (See eMAR) ADL: ADL Eating: Minimal assistance Grooming: Moderate assistance Upper Body Bathing: Maximal assistance(with hand over hand A to hold washcloth) Lower Body Bathing: Dependent Upper Body Dressing: Dependent Where Assessed-Upper Body Dressing: Edge of bed Lower Body Dressing: Dependent Where Assessed-Lower Body Dressing: Bed level Toilet Transfer: Unable to assess Tub/Shower Transfer Method: Unable to assess :     Therapy/Group: Individual Therapy  Michaela A Hoffman 09/11/2019, 12:43 PM

## 2019-09-11 NOTE — Progress Notes (Signed)
Occupational Therapy Session Note  Patient Details  Name: Meredith Roach MRN: [id number] Date of Birth: 04/21/1940  Today's Date: 09/11/2019 OT Individual Time: 1430-1530 OT Individual Time Calculation (min): 60 min    Short Term Goals: Week 1:  OT Short Term Goal 1 (Week 1): Pt will be able to maintain static sit EOB with close S to engage more actively in UB self care. OT Short Term Goal 2 (Week 1): Pt will demonstrate improved R hand function to hold wash cloth in hand to wash thighs. OT Short Term Goal 3 (Week 1): Pt will be able to actively use hands to pull sleeves over arms. OT Short Term Goal 4 (Week 1): Pt will be able to rise to stand in [place] lift with mod A to work towards completing [facility] transfers.  Skilled Therapeutic Interventions/Progress Updates:    Pt received sitting up in TIS with no c/o pain. Pt agreeable to session. NMES completed as described below. Pt then completed several [place] tasks with R UE to increase functional use of hand. Pt with most success in gross grasp. Pt completed towel slides on towel with focus on rhomboid activation. Pt completed reaching activity with functional cleaning task component, with shoulder external rotation as focus.   1:1 NMES applied to anterior forearm to promote increased wrist and finger flexion. Pt guided through flexion functionally, holding a cup and bringing up to mouth with min facilitation. Pt also completed gross grasp with large blocks. Retrograde massage provided to B hands for edema management.   Ratio 1:3 Rate 35 pps Waveform- Asymmetric Ramp 1.0 Pulse 300 Intensity- 15  Duration - 15 minutes  Report of pain at the beginning of session: 0/10 Report of pain at the end of session 0/10  No adverse reactions after treatment and is skin intact.    Pt was returned to her room and left sitting up in the TIS with all needs met.   Therapy Documentation Precautions:  Precautions Precautions: Fall, Cervical Precaution  Booklet Issued: No Precaution Comments: reviewed cervical precautions Required Braces or Orthoses: Cervical Brace Cervical Brace: Hard collar Restrictions Weight Bearing Restrictions: No   Therapy/Group: Individual Therapy  Curtis Sites 09/11/2019, 6:50 AM

## 2019-09-11 NOTE — Progress Notes (Signed)
Meredith Roach PHYSICAL MEDICINE & REHABILITATION PROGRESS NOTE   Subjective/Complaints:   Pt never was sick from UTI- has voided some- but incontinently  Still getting IV ABX.  Asking to increase Flomax, since not voiding "right" and doesn't feel it like "she should" for bowel or bladder.     ROS:  Pt denies SOB, abd pain, CP, N/V/C/D, and vision changes   Objective:   No results found. Recent Labs    09/09/19 0918 09/11/19 0542  WBC 16.7* 10.5  HGB 14.0 14.2  HCT 41.8 43.2  PLT 262 223   Recent Labs    09/09/19 0918 09/11/19 0542  NA 134* 137  K 5.3* 5.1  CL 100 102  CO2 25 28  GLUCOSE 188* 148*  BUN 24* 25*  CREATININE 0.86 0.84  CALCIUM 8.4* 8.4*    Intake/Output Summary (Last 24 hours) at 09/11/2019 0924 Last data filed at 09/11/2019 0459 Gross per 24 hour  Intake 460 ml  Output 1950 ml  Net -1490 ml     Physical Exam: Vital Signs Blood pressure (!) 146/64, pulse 62, temperature 97.7 F (36.5 C), temperature source Oral, resp. rate 16, height 5\' 3"  (1.6 m), weight 76.8 kg, SpO2 98 %.  Physical Exam  General: sitting up in bed; daughter at bedside; appropriate, NAD Mood and affect are appropriate Heart:RRR Lungs:  CTA b/l Abdomen: soft, ND, NT, (+)BS Extremities: No clubbing, cyanosis, or edema- R hand trace swelling  RUE- biceps 5-/5 B/L, WE 3/5 on R 2/5 on L; triceps 4-/5 on R; 3/5 on L; grip 2+/5 on R; 2/5 on L; finger abd 1/5 B/L RLE- HF 2+/5, KE 4-/5, DF 1/5, PF 3/5,  LLE- HF 2/5, KE 3+/5, DF 0/5, PF 3-/5 +Left hip tenderness Neurological: Ox3 C4 intact; C5 intact on R; decreased on L; C6-T1 decreased B/L, but absent on L Can ID LT on feet and knees  bilaterally  Skin: Skin iswarmand dry.Ktape on R hand/wrist still  IV site CDI  Of note, hands more normal temperature Psychiatric:appropriate   Assessment/Plan: 1. 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  Physiatrist is providing close team supervision and 24 hour management of active medical problems listed below.  Physiatrist and rehab team continue to assess barriers to discharge/monitor patient progress toward functional and medical goals  Care Tool:  Bathing        Body parts bathed by helper: Right arm, Left arm, Chest, Abdomen, Front perineal area, Buttocks, Right upper leg, Left upper leg, Right lower leg, Left lower leg, Face     Bathing assist Assist Level: Total Assistance - Patient < 25%     Upper Body Dressing/Undressing Upper body dressing   What is the patient wearing?: Hospital gown only    Upper body assist Assist Level: Dependent - Patient 0%    Lower Body Dressing/Undressing Lower body dressing      What is the patient wearing?: Pants     Lower body assist Assist for lower body dressing: Maximal Assistance - Patient 25 - 49%     Toileting Toileting Toileting Activity did not occur (Clothing management and hygiene only): (pt is incontinent, not safe to sit on a BSC due to poor trunk control)  Toileting assist Assist for toileting: Total Assistance - Patient < 25%     Transfers Chair/bed transfer  Transfers assist     Chair/bed transfer assist level: Dependent -  Patient 0%(Stedy)     Locomotion Ambulation   Ambulation assist   Ambulation activity did not occur: Safety/medical concerns          Walk 10 feet activity   Assist  Walk 10 feet activity did not occur: Safety/medical concerns        Walk 50 feet activity   Assist Walk 50 feet with 2 turns activity did not occur: Safety/medical concerns         Walk 150 feet activity   Assist Walk 150 feet activity did not occur: Safety/medical concerns         Walk 10 feet on uneven surface  activity   Assist Walk 10 feet on uneven surfaces activity did not occur: Safety/medical concerns          Wheelchair     Assist Will patient use wheelchair at discharge?: Yes Type of Wheelchair: (TBD)    Wheelchair assist level: Dependent - Patient 0% Max wheelchair distance: 150'    Wheelchair 50 feet with 2 turns activity    Assist        Assist Level: Dependent - Patient 0%   Wheelchair 150 feet activity     Assist      Assist Level: Dependent - Patient 0%   Blood pressure (!) 146/64, pulse 62, temperature 97.7 F (36.5 C), temperature source Oral, resp. rate 16, height 5\' 3"  (1.6 m), weight 76.8 kg, SpO2 98 %.  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 Status post ACDF 08/28/2019. Decadron taper. Cervical collar as directed. -need to order PRAFOs to prevent Ankle contractures -still on Decadron taper -patient may shower  5/22- improvement daily bodes well- explained that pt to /daughter  5/23- will discuss foley vs in/out caths with pt/daughter today vs Tuesday;   5/25- will try Flomax and remove foley Thursday to see if can void.  5/31- wean Decadron dosing over next week or so - ordered -ELOS/Goals: 4 weeks- goals min-mod assist  -Continue CIR 2. Antithrombotics: -DVT/anticoagulation:SCDs. Check vascular study and check with NSU to see if can start Lovenox 5/21- Dopplers (-) for DVT- need to see when can start Lovenox 5/23- surgery was 5/17- hopefully NSU will let us start 5/27? 5/26- will call to see if can start Lovenox- has been 9 days -antiplatelet therapy: N/A 3. Pain Management:Baclofen 5 mg 3 times daily- will likely need to increase, oxycodone as needed  5/22- not complaining of pain at all- might need to titrate baclofen after a few days.   5/25- will d/w pt need to increase Baclofen? Spasms?  5/28- pt reports spasms doing better- con't current dose- don't increase Baclofen- add lidoderm  patch to R shoulder 9am-9pm 4. Mood:Provide emotional support -antipsychotic agents: N/A 5. Neuropsych: This patientiscapable of making decisions on herown behalf. 6. Skin/Wound Care:Routine skin checksand turn while in bed every 2 hours while awake- keep on sides at night. 7. Fluids/Electrolytes/Nutrition:Routine in and outs with follow-up chemistries  5/24: BMP stable 8. Neurogenic bowel and bladder. con't foley until get her bowels cleaned out- can remove foley IF pt wants to d/c, and is able to do on own at home- if not, might need foley long term;. [place] program. Provide education.  5/27- remove foley today; wrote orders; good Bms last night  5.28- thinks peed, but was incontinent- BM with bowel program last night Incont of urine check PVR  5/31- voids incontinently- will increase Flomax 9. GERD. Protonix 10. Hx of  prediabetes- check an A1c and monitor as required.   5/21- can't find order- reordered for Monday- CBG 140s on BMP- since on decadron- if goes higher, will monitor more regularly.  5/23- labs in AM- ordered CBC/BMP weekly  11. Hx of gout- this acute event could be a trigger for gout- will monitor.  5/26- no gout Sx's so far 12. Leukocytosis with UTI:  5/24: WBC up to 13.3. Afebrile and asymptomatic. Draw UA/UC given indwelling catheter and trend tomorrow.  5/26- >100k E coli, but sensitivities are pending.   5/27- Cx shows resistance to Keflex and WBC still 14k- will change to Bactrim DS BID x 7 days  5/28- changed to Rocephin since WBC up to 19k-down to 16.7 K   7 days of IV rocephin and check labs in AM E coli sensitive to rocephin  Minimal elevation of serum lactic acid, clinically bright , alert and participating in therapy , no clinical concerns for sepsis, will monitor    5/31- con't Rocephin for 7 days total 13. Hyperkalemia  5/27- went from 4.2 to 5.2- might be in error will recheck in AM  5/28- K+ 5.1- will recheck in AM 13.   Hyper K mild , received Lokelma yesterday, no obvious etiology  BMET in am   5/31- K+ 5.1- will recheck in week and give Lokelma if required  LOS: 11 days A FACE TO FACE EVALUATION WAS PERFORMED  Meredith Roach 09/11/2019, 9:24 AM

## 2019-09-12 ENCOUNTER — Inpatient Hospital Stay (HOSPITAL_COMMUNITY): Payer: Medicare PPO | Admitting: Physical Therapy

## 2019-09-12 ENCOUNTER — Inpatient Hospital Stay (HOSPITAL_COMMUNITY): Payer: Medicare PPO | Admitting: Occupational Therapy

## 2019-09-12 ENCOUNTER — Inpatient Hospital Stay (HOSPITAL_COMMUNITY): Payer: Medicare PPO

## 2019-09-12 NOTE — Progress Notes (Signed)
Patient's bowel program was continued during night shift with digital stimulation. She did effectively have a bowel movement, but was mushy with no formed pieces. When digital stimulation was done, nothing was felt in her rectal vault. Will continue to monitor for changes.

## 2019-09-12 NOTE — Progress Notes (Signed)
Physical Therapy Session Note  Patient Details  Name: Meredith Roach MRN: [id number] Date of Birth: 07/04/1940  Today's Date: 09/12/2019 PT Individual Time: 1005-1100 PT Individual Time Calculation (min): 55 min   Short Term Goals: Week 2:  PT Short Term Goal 1 (Week 2): Pt will perform least restrictive transfer with max A consistently PT Short Term Goal 2 (Week 2): Pt will initiate gait training PT Short Term Goal 3 (Week 2): Pt will maintain static sitting balance with close SBA x 5 min  Skilled Therapeutic Interventions/Progress Updates: Pt presents semi-reclined in bed and agreeable to therapy.  Pt required total A for donning thigh-high teds.  Pt able to roll side to side w/ min A and use of side rail, but total A to pull shorts over hips.  Pt attempts to pull shorts up once LEs threaded through, but only able to pull w/ right UE and requires mod A for knee flexion and support to remain in hook-lying position.  Pt performed log roll to right and then to sitting w/ mod A.  Pt required min A to scoot to EOB for donning shoes. Pt can extend knees to allow shoes over feet w/o LOB.  Pt performed sit to stand transfer w/ mod A and then mod A for step-pivot bed > w/c.  Pt required facilitation for weight-shift to advance LEs and blocking of left knee.  Pt fatigues quickly and knees buckling.  Pt wheeled to gym for time and energy conservation.  Pt performed seated reaching outside of BOS w/ feet on platform and w/o trunk support.  Pt performed static sitting balance up to 2-3', w/ c/o right shoulder pain w/ upright posture.  Pt reaching for cones forward and outside of BOS, able to grasp and unstack/stack cones w/ right hand, not left.  Pt performed calf raises, LAQ and hip flexion w/ rest breaks between.  Pt performed scooting forward and back in TIS multiple trials w/ mod to max A, but verbal cues for leaning side to side.  Pt returned to room and left in TIS, tilted back and seat belt on.  All needs  in reach.     Therapy Documentation Precautions:  Precautions Precautions: Fall, Cervical Precaution Booklet Issued: No Precaution Comments: reviewed cervical precautions Required Braces or Orthoses: Cervical Brace Cervical Brace: Hard collar Restrictions Weight Bearing Restrictions: No General:   Vital Signs:   Pain:  pt c/o right shoulder pain of 7/10 when asked to improve upright posture, lidoderm patch applied earlier.    Therapy/Group: Individual Therapy  Ladoris Gene 09/12/2019, 12:31 PM

## 2019-09-12 NOTE — Progress Notes (Signed)
Bowel program was successful after second dig stem with a large brown mushy stool. Pt tolerated well.

## 2019-09-12 NOTE — Plan of Care (Signed)
  Problem: Consults Goal: RH GENERAL PATIENT EDUCATION Description: See Patient Education module for education specifics. Outcome: Progressing Goal: Skin Care Protocol Initiated - if Braden Score 18 or less Description: N/A Outcome: Progressing   Problem: RH BOWEL ELIMINATION Goal: RH STG MANAGE BOWEL WITH ASSISTANCE Description: STG Manage Bowel with min I Assistance. Outcome: Progressing   Problem: RH BLADDER ELIMINATION Goal: RH STG MANAGE BLADDER WITH EQUIPMENT WITH ASSISTANCE Description: STG Manage Bladder With Equipment With Min I Assistance Outcome: Progressing   Problem: RH SKIN INTEGRITY Goal: RH STG SKIN FREE OF INFECTION/BREAKDOWN Description: Remain free of breakdown during admission Outcome: Progressing Goal: RH STG MAINTAIN SKIN INTEGRITY WITH ASSISTANCE Description: STG Maintain Skin Integrity With Min I Assistance. Outcome: Progressing Goal: RH STG ABLE TO PERFORM INCISION/WOUND CARE W/ASSISTANCE Description: STG Able To Perform Incision/Wound Care With Min I Assistance. Outcome: Progressing   Problem: RH SAFETY Goal: RH STG ADHERE TO SAFETY PRECAUTIONS W/ASSISTANCE/DEVICE Description: STG Adhere to Safety Precautions With Mod  Assistance Outcome: Progressing   Problem: RH PAIN MANAGEMENT Goal: RH STG PAIN MANAGED AT OR BELOW PT'S PAIN GOAL Description: Pain goal of 3 on pain scale of 0-10 Outcome: Progressing   

## 2019-09-12 NOTE — Progress Notes (Signed)
Patient ID: Meredith Roach, female   DOB: 1940/04/29, 79 y.o.   MRN: [id number]   Sw met with pt in room to provide updates from team conference, and d/c date 6/18. Pt does report that her dtr Meredith Roach will return the following week, and their other dtr [name] will be here this week.   Loralee Pacas, MSW, [place] Office: [phone] Cell: [phone] Fax: [phone]

## 2019-09-12 NOTE — Progress Notes (Signed)
[facility] PHYSICAL MEDICINE & REHABILITATION PROGRESS NOTE   Subjective/Complaints:   Pt reports moving her knees and feet better Had a good bowel movement with program.   No other issues No voiding yet- increased flomax yesterday ROS:  Pt denies SOB, abd pain, CP, N/V/C/D, and vision changes   Objective:   No results found. Recent Labs    09/11/19 0542  WBC 10.5  HGB 14.2  HCT 43.2  PLT 223   Recent Labs    09/11/19 0542  NA 137  K 5.1  CL 102  CO2 28  GLUCOSE 148*  BUN 25*  CREATININE 0.84  CALCIUM 8.4*    Intake/Output Summary (Last 24 hours) at 09/12/2019 0924 Last data filed at 09/12/2019 0730 Gross per 24 hour  Intake 780 ml  Output 2749 ml  Net -1969 ml     Physical Exam: Vital Signs Blood pressure 120/66, pulse 70, temperature 97.9 F (36.6 C), resp. rate 17, height 5\' 3"  (1.6 m), weight 76.8 kg, SpO2 96 %.  Physical Exam  General: sitting up in bed- moving LEs around, appropriate, NAD Mood and affect are appropriate Heart: no JVD Lungs:  No resp distress, accessory muscle use Abdomen: appears nondistended Extremities: No clubbing, cyanosis, or edema- R hand trace swelling  RUE- biceps 5-/5 B/L, WE 3/5 on R 2/5 on L; triceps 4-/5 on R; 3/5 on L; grip 2+/5 on R; 2/5 on L; finger abd 1/5 B/L RLE- HF 2+/5, KE 4-/5, DF 1/5, PF 3/5,  LLE- HF 2/5, KE 3+/5, DF 0/5, PF 3-/5 +Left hip tenderness Neurological: Ox3 C4 intact; C5 intact on R; decreased on L; C6-T1 decreased B/L, but absent on L Can ID LT on feet and knees  bilaterally  Skin: Skin iswarmand dry.Ktape on R hand/wrist still  IV site CDI  Psychiatric:appropriate   Assessment/Plan: 1. 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  Physiatrist is providing close team supervision and 24 hour management of active medical problems listed below.  Physiatrist and rehab  team continue to assess barriers to discharge/monitor patient progress toward functional and medical goals  Care Tool:  Bathing        Body parts bathed by helper: Right arm, Left arm, Chest, Abdomen, Front perineal area, Buttocks, Right upper leg, Left upper leg, Right lower leg, Left lower leg, Face     Bathing assist Assist Level: Total Assistance - Patient < 25%     Upper Body Dressing/Undressing Upper body dressing   What is the patient wearing?: Hospital gown only    Upper body assist Assist Level: Dependent - Patient 0%    Lower Body Dressing/Undressing Lower body dressing      What is the patient wearing?: Pants     Lower body assist Assist for lower body dressing: Maximal Assistance - Patient 25 - 49%     Toileting Toileting Toileting Activity did not occur (Clothing management and hygiene only): (pt is incontinent, not safe to sit on a BSC due to poor trunk control)  Toileting assist Assist for toileting: Total Assistance - Patient < 25%     Transfers Chair/bed transfer  Transfers assist     Chair/bed transfer assist level: Dependent - mechanical lift(stedy)     Locomotion Ambulation   Ambulation assist   Ambulation activity did not occur: Safety/medical concerns          Walk 10 feet activity  Assist  Walk 10 feet activity did not occur: Safety/medical concerns        Walk 50 feet activity   Assist Walk 50 feet with 2 turns activity did not occur: Safety/medical concerns         Walk 150 feet activity   Assist Walk 150 feet activity did not occur: Safety/medical concerns         Walk 10 feet on uneven surface  activity   Assist Walk 10 feet on uneven surfaces activity did not occur: Safety/medical concerns         Wheelchair     Assist Will patient use wheelchair at discharge?: Yes Type of Wheelchair: (TBD)    Wheelchair assist level: Dependent - Patient 0% Max wheelchair distance: 150'     Wheelchair 50 feet with 2 turns activity    Assist        Assist Level: Dependent - Patient 0%   Wheelchair 150 feet activity     Assist      Assist Level: Dependent - Patient 0%   Blood pressure 120/66, pulse 70, temperature 97.9 F (36.6 C), resp. rate 17, height 5\' 3"  (1.6 m), weight 76.8 kg, SpO2 96 %.  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 Status post ACDF 08/28/2019. Decadron taper. Cervical collar as directed. -need to order PRAFOs to prevent Ankle contractures -still on Decadron taper -patient may shower  5/22- improvement daily bodes well- explained that pt to /daughter  5/23- will discuss foley vs in/out caths with pt/daughter today vs Tuesday;   5/25- will try Flomax and remove foley Thursday to see if can void.  5/31- wean Decadron dosing over next week or so - ordered -ELOS/Goals: 4 weeks- goals min-mod assist  -Continue CIR 2. Antithrombotics: -DVT/anticoagulation:SCDs. Check vascular study and check with [facility]U to see if can start Lovenox 5/21- Dopplers (-) for DVT- need to see when can start Lovenox 5/23- surgery was 5/17- hopefully NSU will let us start 5/27? 5/26- will call to see if can start Lovenox- has been 9 days -antiplatelet therapy: N/A 3. Pain Management:Baclofen 5 mg 3 times daily- will likely need to increase, oxycodone as needed  5/22- not complaining of pain at all- might need to titrate baclofen after a few days.   5/25- will d/w pt need to increase Baclofen? Spasms?  5/28- pt reports spasms doing better- con't current dose- don't increase Baclofen- add lidoderm patch to R shoulder 9am-9pm 4. Mood:Provide emotional support -antipsychotic agents: N/A 5. Neuropsych: This patientiscapable of making decisions on herown behalf. 6. Skin/Wound Care:Routine skin  checksand turn while in bed every 2 hours while awake- keep on sides at night. 7. Fluids/Electrolytes/Nutrition:Routine in and outs with follow-up chemistries  5/24: BMP stable 8. Neurogenic bowel and bladder. con't foley until get her bowels cleaned out- can remove foley IF pt wants to d/c, and is able to do on own at home- if not, might need foley long term;. [place] program. Provide education.  5/27- remove foley today; wrote orders; good Bms last night  5.28- thinks peed, but was incontinent- BM with bowel program last night Incont of urine check PVR  5/31- voids incontinently- will increase Flomax  6/1- no voiding still- pt has no sensation 9. GERD. Protonix 10. Hx of prediabetes- check an A1c and monitor as required.   5/21- can't find order- reordered for Monday- CBG 140s on BMP- since on decadron- if goes higher, will monitor more regularly.  5/23- labs in AM- ordered CBC/BMP weekly  11. Hx of gout- this acute event could be a trigger for gout- will monitor.  5/26- no gout Sx's so far 12. Leukocytosis with UTI:  5/24: WBC up to 13.3. Afebrile and asymptomatic. Draw UA/UC given indwelling catheter and trend tomorrow.  5/26- >100k E coli, but sensitivities are pending.   5/27- Cx shows resistance to Keflex and WBC still 14k- will change to Bactrim DS BID x 7 days  5/28- changed to Rocephin since WBC up to 19k-down to 16.7 K   7 days of IV rocephin and check labs in AM E coli sensitive to rocephin  Minimal elevation of serum lactic acid, clinically bright , alert and participating in therapy , no clinical concerns for sepsis, will monitor    5/31- con't Rocephin for 7 days total 13. Hyperkalemia  5/27- went from 4.2 to 5.2- might be in error will recheck in AM  5/28- K+ 5.1- will recheck in AM 13.  Hyper K mild , received Lokelma yesterday, no obvious etiology  BMET in am   5/31- K+ 5.1- will recheck in week and give Lokelma if required  LOS: 12 days A FACE TO FACE  EVALUATION WAS PERFORMED  Zeppelin Beckstrand 09/12/2019, 9:24 AM

## 2019-09-12 NOTE — Progress Notes (Signed)
Occupational Therapy Session Note  Patient Details  Name: Meredith Roach MRN: [id number] Date of Birth: November 05, 1940  Today's Date: 09/12/2019 OT Individual Time: [id number] OT Individual Time Calculation (min): 43 min    Short Term Goals: Week 2:  OT Short Term Goal 1 (Week 2): Pt will be able to actively use hands to pull sleeves over arms. OT Short Term Goal 2 (Week 2): Pt will perform sit<>stand with RW with mod A in preparation for pulling pants over hips OT Short Term Goal 3 (Week 2): Pt will use her LUE/hand with mod A particiipating in bathng tasks OT Short Term Goal 4 (Week 2): Pt will perform 1/3 toileting tasks with max A  Skilled Therapeutic Interventions/Progress Updates:    Upon entering the room, pt in tilt in space wheelchair and reports feeling very fatigued this session. Focus on A/AROM and PROM for B UEs in all planes of movement with pt reporting no pain with exercises. Pt also attempting opposition, rapid alternating movements, and finger isolation with increased difficulty. Pt standing into stedy with max A from wheelchair secondary to fatigue. Pt standing from elevated stedy seat with min A to bed. Pt maintaining sitting balance at min guard while therapist removed shoes. Pt performed sit >supine with Mod A for B LEs. OT assisted with repositioning pt and removal of hard collar once in bed. Call bell and all needed items within reach with bed alarm activated.   Therapy Documentation Precautions:  Precautions Precautions: Fall, Cervical Precaution Booklet Issued: No Precaution Comments: reviewed cervical precautions Required Braces or Orthoses: Cervical Brace Cervical Brace: Hard collar Restrictions Weight Bearing Restrictions: No Vital Signs: Therapy Vitals Temp: 97.8 F (36.6 C) Pulse Rate: 76 Resp: 16 BP: (!) 108/55 Patient Position (if appropriate): Sitting Oxygen Therapy SpO2: 97 % O2 Device: Room Air ADL: ADL Eating: Minimal assistance Grooming:  Moderate assistance Upper Body Bathing: Maximal assistance(with hand over hand A to hold washcloth) Lower Body Bathing: Dependent Upper Body Dressing: Dependent Where Assessed-Upper Body Dressing: Edge of bed Lower Body Dressing: Dependent Where Assessed-Lower Body Dressing: Bed level Toilet Transfer: Unable to assess Tub/Shower Transfer Method: Unable to assess   Therapy/Group: Individual Therapy  Gypsy Decant 09/12/2019, 4:34 PM

## 2019-09-12 NOTE — Progress Notes (Signed)
Physical Therapy Session Note  Patient Details  Name: Meredith Roach MRN: [id number] Date of Birth: Oct 27, 1940  Today's Date: 09/12/2019 PT Individual Time: 1125-1150 PT Individual Time Calculation (min): 25 min   Short Term Goals: Week 2:  PT Short Term Goal 1 (Week 2): Pt will perform least restrictive transfer with max A consistently PT Short Term Goal 2 (Week 2): Pt will initiate gait training PT Short Term Goal 3 (Week 2): Pt will maintain static sitting balance with close SBA x 5 min  Skilled Therapeutic Interventions/Progress Updates:   Pt in TIS and agreeable to therapy, denies pain or dizziness. Total assist w/c transport to/from therapy gym. Worked on BLE strengthening on kinetron, 1 min x5 @ 70 cm/sec. Worked on sit<>stands to [employer] walker w/ min-mod assist to boost and to stabilize in stance. Verbal cues to not brace anteriorly on eva walker for support and to rely on BLEs to hold her w/ therapist's assist. Performed stepping in place w/ min-mod assist for balance, focusing on each LE holding her up w/ weight acceptance. Returned to sitting for brief rest break and then ambulated w/ eva walker w/ max assist +3. 2 skilled therapists at each side of her, she needed mod assist to progress LLE for step placement, mod-max assist for lateral weight shifting at pelvis and for upright balance. 2nd helper providing close w/c follow and helper at eva walker to keep it moving straight. Ambulated 5' in total. Pt very encouraged by this. Returned to room total assist and ended session in TIS, all needs in reach.   Therapy Documentation Precautions:  Precautions Precautions: Fall, Cervical Precaution Booklet Issued: No Precaution Comments: reviewed cervical precautions Required Braces or Orthoses: Cervical Brace Cervical Brace: Hard collar Restrictions Weight Bearing Restrictions: No  Therapy/Group: Individual Therapy  Nahima Ales K Alayssa Flinchum 09/12/2019, 12:00 PM

## 2019-09-12 NOTE — Progress Notes (Signed)
Occupational Therapy Session Note  Patient Details  Name: Meredith Roach MRN: [id number] Date of Birth: Sep 02, 1940  Today's Date: 09/12/2019 OT Individual Time: 1300-1358 OT Individual Time Calculation (min): 58 min    Short Term Goals: Week 2:  OT Short Term Goal 1 (Week 2): Pt will be able to actively use hands to pull sleeves over arms. OT Short Term Goal 2 (Week 2): Pt will perform sit<>stand with RW with mod A in preparation for pulling pants over hips OT Short Term Goal 3 (Week 2): Pt will use her LUE/hand with mod A particiipating in bathng tasks OT Short Term Goal 4 (Week 2): Pt will perform 1/3 toileting tasks with max A  Skilled Therapeutic Interventions/Progress Updates:    Pt resting in bed upon arrival and agreeable to getting OOB and participating in therapy. Pt required max A for supine>sit EOB.  Sitting balance with min A. Sit<>stand in [place] with min A to transfer to w/c. Pt transitioned to Day Room for BUE gross motor and [facility] tasks. Pt noted with improved LUE gross grasp and finger flexion (no extension). Pt able to grasp large pegs with L hand and place in peg board. Pt used R hand to grasp colored pegs and push into peg board.  All pegs removed after placement and rest break. LUE NMES with focus on finger flexion.  1:1 NMES applied to anterior forearm to promote increased wrist and finger flexion. Pt guided through flexion functionally, holding a cup and bringing up to mouth with min facilitation.   Ratio 1:3 Rate 35 pps Waveform- Asymmetric Ramp 1.0 Pulse 300 Intensity- 15  Duration - 10 minutes  Report of pain at the beginning of session: 0/10 Report of pain at the end of session 0/10  No adverse reactions after treatment and is skin intact.   Pt returned to room and remained in TIS w/c with all needs within reach and belt alarm activtated.   Therapy Documentation Precautions:  Precautions Precautions: Fall, Cervical Precaution Booklet Issued:  No Precaution Comments: reviewed cervical precautions Required Braces or Orthoses: Cervical Brace Cervical Brace: Hard collar Restrictions Weight Bearing Restrictions: No Pain:  Pt denies pain this afternoon   Therapy/Group: Individual Therapy  Leroy Libman 09/12/2019, 2:51 PM

## 2019-09-13 ENCOUNTER — Inpatient Hospital Stay (HOSPITAL_COMMUNITY): Payer: Medicare PPO

## 2019-09-13 ENCOUNTER — Inpatient Hospital Stay (HOSPITAL_COMMUNITY): Payer: Medicare PPO | Admitting: Physical Therapy

## 2019-09-13 LAB — BASIC METABOLIC PANEL
Anion gap: 10 (ref 5–15)
BUN: 27 mg/dL — ABNORMAL HIGH (ref 8–23)
CO2: 25 mmol/L (ref 22–32)
Calcium: 8.7 mg/dL — ABNORMAL LOW (ref 8.9–10.3)
Chloride: 103 mmol/L (ref 98–111)
Creatinine, Ser: 0.94 mg/dL (ref 0.44–1.00)
GFR calc Af Amer: >60 mL/min (ref >60)
GFR calc non Af Amer: 59 mL/min — ABNORMAL LOW (ref >60)
Glucose, Bld: 177 mg/dL — ABNORMAL HIGH (ref 70–99)
Potassium: 5 mmol/L (ref 3.5–5.1)
Sodium: 138 mmol/L (ref 135–145)

## 2019-09-13 NOTE — Progress Notes (Signed)
Occupational Therapy Session Note  Patient Details  Name: Meredith Roach MRN: [id number] Date of Birth: 1940-05-13  Today's Date: 09/13/2019 OT Individual Time: 1400-1430 OT Individual Time Calculation (min): 30 min    Short Term Goals: Week 2:  OT Short Term Goal 1 (Week 2): Pt will be able to actively use hands to pull sleeves over arms. OT Short Term Goal 2 (Week 2): Pt will perform sit<>stand with RW with mod A in preparation for pulling pants over hips OT Short Term Goal 3 (Week 2): Pt will use her LUE/hand with mod A particiipating in bathng tasks OT Short Term Goal 4 (Week 2): Pt will perform 1/3 toileting tasks with max A  Skilled Therapeutic Interventions/Progress Updates:    Pt resting in w/c upon arrival.  OT intervention with focus on use of AE for self care; red foam for utensils (pt able to grasp adapted utensil and pronate forearm to bring to mouth), coban around cup to facilitate grasp (pt able to grasp cup and bring to mouth), and coban around brush to facilitate pt brushing hair (pt able to brush hair without assistance). Pt remained in w/c with all needs within reach.   Therapy Documentation Precautions:  Precautions Precautions: Fall, Cervical Precaution Booklet Issued: No Precaution Comments: reviewed cervical precautions Required Braces or Orthoses: Cervical Brace Cervical Brace: Hard collar Restrictions Weight Bearing Restrictions: No Pain: Pain Assessment Pain Scale: 0-10 Pain Score: 0-No pain  Therapy/Group: Individual Therapy  Leroy Libman 09/13/2019, 2:44 PM

## 2019-09-13 NOTE — Progress Notes (Signed)
Physical Therapy Session Note  Patient Details  Name: Meredith Roach MRN: [id number] Date of Birth: March 28, 1941  Today's Date: 09/13/2019 PT Individual Time: 0930-0958 PT Individual Time Calculation (min): 28 min   Short Term Goals: Week 2:  PT Short Term Goal 1 (Week 2): Pt will perform least restrictive transfer with max A consistently PT Short Term Goal 2 (Week 2): Pt will initiate gait training PT Short Term Goal 3 (Week 2): Pt will maintain static sitting balance with close SBA x 5 min  Skilled Therapeutic Interventions/Progress Updates:    Patient received up in TIS WC, pleasant and willing to participate in session today. Focused session on practicing maintaining static standing in stedy frame without leaning on crossbar of stedy and without buckling or hyperextending knees- able to maintain with MinA for balance but Min-modA for full upright posture for 1-79mnutes at at time before fatiguing. Also worked on lateral weight shifts in stedy with MinA for balance, easily fatigued. Otherwise continued with seated quad strengthening from [place]. Left up in chair in tilted position and positioned to comfort with all needs met this morning.   Therapy Documentation Precautions:  Precautions Precautions: Fall, Cervical Precaution Booklet Issued: No Precaution Comments: reviewed cervical precautions Required Braces or Orthoses: Cervical Brace Cervical Brace: Hard collar Restrictions Weight Bearing Restrictions: No Pain: Pain Assessment Pain Scale: 0-10 Pain Score: 0-No pain    Therapy/Group: Individual Therapy   KWindell Norfolk DPT, PN1   Supplemental Physical Therapist [place]   Pager [phone] Rehab Office [phone]   09/13/2019, 12:27 PM

## 2019-09-13 NOTE — Progress Notes (Signed)
Occupational Therapy Session Note  Patient Details  Name: Meredith Roach MRN: [id number] Date of Birth: [birth year]  Today's Date: 09/13/2019 OT Individual Time: [id number] OT Individual Time Calculation (min): 70 min    Short Term Goals: Week 2:  OT Short Term Goal 1 (Week 2): Pt will be able to actively use hands to pull sleeves over arms. OT Short Term Goal 2 (Week 2): Pt will perform sit<>stand with RW with mod A in preparation for pulling pants over hips OT Short Term Goal 3 (Week 2): Pt will use her LUE/hand with mod A particiipating in bathng tasks OT Short Term Goal 4 (Week 2): Pt will perform 1/3 toileting tasks with max A  Skilled Therapeutic Interventions/Progress Updates:    Pt resting in TIS w/c upon arrival. OT intervention with focus on functional use and strengthening of BUE/hands/grasp. Pt engaged in variety of table tasks to address decreased functional grasp and use of BUE in ADLs. Activities included grasping clothes pins (yellow and red) and placing on dowels. Pt removed clothes pins back into container. Pt also grasped small wooden cubes with velcro and placed velcro squares on board. Pt removed cubes and placed in container. Pt also used theraputty for strengthening on B hand grasp and B fingers. Focus switched to in hand manipulation tasks and [facility] tasks with poker chips-picking up and stacking. Pt pleased with progress she has noticed during the past week. Pt remained in TIS w/c with all needs within reach.   Therapy Documentation Precautions:  Precautions Precautions: Fall, Cervical Precaution Booklet Issued: No Precaution Comments: reviewed cervical precautions Required Braces or Orthoses: Cervical Brace Cervical Brace: Hard collar Restrictions Weight Bearing Restrictions: No  Pain:  Pt denies pain this morning   Therapy/Group: Individual Therapy  Leroy Libman 09/13/2019, 12:11 PM

## 2019-09-13 NOTE — Progress Notes (Signed)
Bowel program started at 1820 with dig stim and dulcolax suppository. No results until end of shift. Reported off to oncoming staff.   Gerald Stabs, RN

## 2019-09-13 NOTE — Progress Notes (Signed)
Physical Therapy Session Note  Patient Details  Name: Meredith Roach MRN: [id number] Date of Birth: 1940-10-13  Today's Date: 09/13/2019 PT Individual Time: 1700-1725 PT Individual Time Calculation (min): 25 min   Short Term Goals: Week 2:  PT Short Term Goal 1 (Week 2): Pt will perform least restrictive transfer with max A consistently PT Short Term Goal 2 (Week 2): Pt will initiate gait training PT Short Term Goal 3 (Week 2): Pt will maintain static sitting balance with close SBA x 5 min  Skilled Therapeutic Interventions/Progress Updates:   Pt received supine in bed and agreeable to PT at bed level. PT instructed pt in BUE/BLE NMR: wrist flexion/extenison 2 x 1 min, wrist supination/pronation x 15, chest press with AAROM at elbow for full extension with wrist in extension. Hip/knee flexion/extension 2 x 15 BLE with manual resistance in each direction, hip abduction x 10 BLE with AAROM, clam shells x 10 with BLE stabilized. Prolonged heel cord stretch 2 x 1 min bil. Pt  left supine in bed with call bell in reach and all needs met.        Therapy Documentation Precautions:  Precautions Precautions: Fall, Cervical Precaution Booklet Issued: No Precaution Comments: reviewed cervical precautions Required Braces or Orthoses: Cervical Brace Cervical Brace: Hard collar Restrictions Weight Bearing Restrictions: No   Pain: Denies at rest    Therapy/Group: Individual Therapy  Lorie Phenix 09/13/2019, 5:33 PM

## 2019-09-13 NOTE — Patient Care Conference (Signed)
Inpatient RehabilitationTeam Conference and Plan of Care Update Date: 09/13/2019   Time: 9:53 AM    Patient Name: Meredith Roach      Medical Record Number: [id number]  Date of Birth: 04/11/1941 Sex: Female         Room/Bed: 4M11C/4M11C-01 Payor Info: Payor: [employer] MEDICARE / Plan: [employer] MEDICARE CHOICE PPO / Product Type: *No Product type* /    Admit Date/Time:  08/31/2019  5:23 PM  Primary Diagnosis:  Quadriplegia [facility])  Patient Active Problem List   Diagnosis Date Noted  . Quadriplegia ([place]) 08/31/2019  . S/P cervical spinal fusion 08/28/2019  . 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 Radiculopathy 10/21/2016    Expected Discharge Date: Expected Discharge Date: 09/29/19  Team Members Present: Physician leading conference: Dr. [name] Care Coodinator Present: Loralee Pacas, LCSWA;Other (comment)(Briston Lax Creig Hines, RN, BSN, CRRN) Nurse Present: Dwaine Gale, RN PT Present: Canary Brim, PT OT Present: Roanna Epley, COTA SLP Present: Charolett Bumpers, SLP PPS Coordinator present : Ileana Ladd, Burna Mortimer, SLP     Current Status/Progress Goal Weekly Team Focus  Bowel/Bladder   pt requiring I&O cath every 6 hours, bowel program LBM 09/11/2019  voiding on own, bowel continence  continue bowel program   Swallow/Nutrition/ Hydration             ADL's   bathing/dressing-tot A at bed level; functional transfers-Stedy; sit<>stand with Stedy-mod A  bathing-mod A; UB dressing-mod A; LB dressing-max A; self feeding-supervision/setup; toilet tranfsers-mod A; toileting-max A  NMR, functional transfers, sitting balance, education, ADL retraining activity tolerance   Mobility   mod to max A bed mobility, min to mod A sit to stand, transfer via stedy  mod A overall at w/c level  sit to stand, transfers, gait initiation as safe and able, LE NMR, sitting balance   Communication             Safety/Cognition/ Behavioral Observations             Pain   pt c/o pain 7/10 intermittently  decrease pain to less then 4  assess pain every shift and prn.   Skin   surgical incision to left side of neck  remain free from skin break down and infection  assess skin every shift and prn    Rehab Goals Patient on target to meet rehab goals: Yes *See Care Plan and progress notes for long and short-term goals.     Barriers to Discharge  Current Status/Progress Possible Resolutions Date Resolved   Nursing                  PT  Decreased caregiver support;Medical stability;Home environment access/layout;Incontinence;Neurogenic Bowel & Bladder                 OT                  SLP                Care Coordinator                Discharge Planning/Teaching Needs:  D/c to home with her husband who will provide 24/7 care. Pt reports their daughters will rotate their time. Dtr [name] intends to stay once school has ended.  Family education as recommended   Team Discussion:  Nursing reports patient is not voiding and requires I&O cathing every 6 hours. Bowel movements are not formed. OT reports that RUE functional return,  while trialing Estem on the LUE, there is some functional return. PT states the patient will need 24/7 care and family education will need to be geared towards that goal.  Revisions to Treatment Plan:  n/a    Medical Summary Current Status: caths q6 H- BM last night and this AM; mushy BMs; pain OK- likes lidoderm Weekly Focus/Goal: goals Mod-max A; Total A at bed level for ADLs except feeding good return functionally in RUE; prox >distal; did estim for LUE yesterrday- good results- works hard!  Barriers to Discharge: Decreased family/caregiver support;Home enviroment access/layout;Incontinence;Neurogenic Bowel & Bladder;Weight bearing restrictions;IV antibiotics  Barriers to Discharge Comments: K+ elevated- might need [place] Possible Resolutions to Barriers: bed mod-max A; steady for transfers; needs 24/7 at  home;   Continued Need for Acute Rehabilitation Level of Care: The patient requires daily medical management by a physician with specialized training in physical medicine and rehabilitation for the following reasons: Direction of a multidisciplinary physical rehabilitation program to maximize functional independence : Yes Medical management of patient stability for increased activity during participation in an intensive rehabilitation regime.: Yes Analysis of laboratory values and/or radiology reports with any subsequent need for medication adjustment and/or medical intervention. : Yes   I attest that I was present, lead the team conference, and concur with the assessment and plan of the team.   Cristi Loron 09/13/2019, 9:53 AM

## 2019-09-13 NOTE — Progress Notes (Addendum)
Physical Therapy Session Note  Patient Details  Name: AYRICA CONNICK MRN: [id number] Date of Birth: [birth year]  Today's Date: 09/13/2019 PT Individual Time: 0800-0900 PT Individual Time Calculation (min): 60 min   Short Term Goals: Week 2:  PT Short Term Goal 1 (Week 2): Pt will perform least restrictive transfer with max A consistently PT Short Term Goal 2 (Week 2): Pt will initiate gait training PT Short Term Goal 3 (Week 2): Pt will maintain static sitting balance with close SBA x 5 min  Skilled Therapeutic Interventions/Progress Updates:    Pt received seated in bed, agreeable to PT session. No complaints of pain. Pt is dependent to don TED hose and pants at bed level for time conservation, rolling with min A. Supine to sit with max A for BLE management and trunk control via logroll with HOB elevated. Pt is dependent to don pants while seated EOB. Sit to stand with max A to stedy. Stedy transfer to TIS w/c. Dependent transport to/from therapy gym via TIS w/c. Introduced Warehouse manager this session. Pt is max A for SB transfer w/c to/from mat table with max cueing for sequencing and setup of transfer. Pt has some anxiety with transfer due to decreased trunk control (posterior lean). Provided pt with home measurement sheet and discussed she will likely need a ramp installed for safe entry to the home upon d/c. Pt receptive to education and to discuss with her family. Pt left semi-reclined in bed with needs in reach at end of session.  Therapy Documentation Precautions:  Precautions Precautions: Fall, Cervical Precaution Booklet Issued: No Precaution Comments: reviewed cervical precautions Required Braces or Orthoses: Cervical Brace Cervical Brace: Hard collar Restrictions Weight Bearing Restrictions: No    Therapy/Group: Individual Therapy   Excell Seltzer, PT, DPT  09/13/2019, 12:10 PM

## 2019-09-13 NOTE — Progress Notes (Signed)
[facility] PHYSICAL MEDICINE & REHABILITATION PROGRESS NOTE   Subjective/Complaints:   Hasn't voided yet, still. Did have a large BM yesterday with bowel program.   Got food hung up on R side- coughed/and drank water- got fixed.  ROS:  Pt denies SOB, abd pain, CP, N/V/C/D, and vision changes    Objective:   No results found. Recent Labs    09/11/19 0542  WBC 10.5  HGB 14.2  HCT 43.2  PLT 223   Recent Labs    09/11/19 0542  NA 137  K 5.1  CL 102  CO2 28  GLUCOSE 148*  BUN 25*  CREATININE 0.84  CALCIUM 8.4*    Intake/Output Summary (Last 24 hours) at 09/13/2019 0804 Last data filed at 09/13/2019 [id number] Gross per 24 hour  Intake 840 ml  Output 2775 ml  Net -1935 ml     Physical Exam: Vital Signs Blood pressure (!) 126/59, pulse 62, temperature 97.7 F (36.5 C), temperature source Oral, resp. rate 18, height 5\' 3"  (1.6 m), weight 76.8 kg, SpO2 98 %.  Physical Exam  General: awake, sitting up and getting fed by NT, NAD Mood and affect are appropriate Heart: no JVD Lungs: no accessory muscle use Abdomen: nondistended Extremities: No clubbing, cyanosis, or edema- R hand trace swelling  RUE- biceps 5-/5 B/L, WE 3/5 on R 2/5 on L; triceps 4-/5 on R; 3/5 on L; grip 2+/5 on R; 2/5 on L; finger abd 1/5 B/L RLE- HF 2+/5, KE 4-/5, DF 1/5, PF 3/5,  LLE- HF 2/5, KE 3+/5, DF 0/5, PF 3-/5 +Left hip tenderness Neurological: Ox3 C4 intact; C5 intact on R; decreased on L; C6-T1 decreased B/L, but absent on L Can ID LT on feet and knees  bilaterally  Skin: Skin iswarmand dry.Ktape on R hand/wrist still  IV site CDI  Psychiatric:appropriate   Assessment/Plan: 1. 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  Physiatrist is providing close team supervision and 24 hour management of active medical problems listed below.  Physiatrist and rehab team  continue to assess barriers to discharge/monitor patient progress toward functional and medical goals  Care Tool:  Bathing        Body parts bathed by helper: Right arm, Left arm, Chest, Abdomen, Front perineal area, Buttocks, Right upper leg, Left upper leg, Right lower leg, Left lower leg, Face     Bathing assist Assist Level: Total Assistance - Patient < 25%     Upper Body Dressing/Undressing Upper body dressing   What is the patient wearing?: Hospital gown only    Upper body assist Assist Level: Dependent - Patient 0%    Lower Body Dressing/Undressing Lower body dressing      What is the patient wearing?: Pants     Lower body assist Assist for lower body dressing: Maximal Assistance - Patient 25 - 49%     Toileting Toileting Toileting Activity did not occur (Clothing management and hygiene only): (pt is incontinent, not safe to sit on a BSC due to poor trunk control)  Toileting assist Assist for toileting: Moderate Assistance - Patient 50 - 74%     Transfers Chair/bed transfer  Transfers assist     Chair/bed transfer assist level: Dependent - mechanical lift     Locomotion Ambulation   Ambulation assist   Ambulation activity did not occur: Safety/medical concerns  Assist level: 2 helpers Assistive device: Ethelene Hal Max distance: 5'  Walk 10 feet activity   Assist  Walk 10 feet activity did not occur: Safety/medical concerns        Walk 50 feet activity   Assist Walk 50 feet with 2 turns activity did not occur: Safety/medical concerns         Walk 150 feet activity   Assist Walk 150 feet activity did not occur: Safety/medical concerns         Walk 10 feet on uneven surface  activity   Assist Walk 10 feet on uneven surfaces activity did not occur: Safety/medical concerns         Wheelchair     Assist Will patient use wheelchair at discharge?: Yes Type of Wheelchair: (TBD)    Wheelchair assist level: Dependent -  Patient 0% Max wheelchair distance: 150'    Wheelchair 50 feet with 2 turns activity    Assist        Assist Level: Dependent - Patient 0%   Wheelchair 150 feet activity     Assist      Assist Level: Dependent - Patient 0%   Blood pressure (!) 126/59, pulse 62, temperature 97.7 F (36.5 C), temperature source Oral, resp. rate 18, height 5\' 3"  (1.6 m), weight 76.8 kg, SpO2 98 %.  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 Status post ACDF 08/28/2019. Decadron taper. Cervical collar as directed. -need to order PRAFOs to prevent Ankle contractures -still on Decadron taper -patient may shower  5/22- improvement daily bodes well- explained that pt to /daughter  5/23- will discuss foley vs in/out caths with pt/daughter today vs Tuesday;   5/25- will try Flomax and remove foley Thursday to see if can void.  5/31- wean Decadron dosing over next week or so - ordered -ELOS/Goals: 4 weeks- goals min-mod assist  -Continue CIR 2. Antithrombotics: -DVT/anticoagulation:SCDs. Check vascular study and check with NSU to see if can start Lovenox 5/21- Dopplers (-) for DVT- need to see when can start Lovenox 5/23- surgery was 5/17- hopefully NSU will let us start 5/27? 5/26- will call to see if can start Lovenox- has been 9 days -antiplatelet therapy: N/A 3. Pain Management:Baclofen 5 mg 3 times daily- will likely need to increase, oxycodone as needed  5/22- not complaining of pain at all- might need to titrate baclofen after a few days.   5/25- will d/w pt need to increase Baclofen? Spasms?  5/28- pt reports spasms doing better- con't current dose- don't increase Baclofen- add lidoderm patch to R shoulder 9am-9pm 4. Mood:Provide emotional support -antipsychotic agents: N/A 5. Neuropsych: This patientiscapable of  making decisions on herown behalf. 6. Skin/Wound Care:Routine skin checksand turn while in bed every 2 hours while awake- keep on sides at night. 7. Fluids/Electrolytes/Nutrition:Routine in and outs with follow-up chemistries  5/24: BMP stable 8. Neurogenic bowel and bladder. con't foley until get her bowels cleaned out- can remove foley IF pt wants to d/c, and is able to do on own at home- if not, might need foley long term;. [place] program. Provide education.  5/27- remove foley today; wrote orders; good Bms last night  5.28- thinks peed, but was incontinent- BM with bowel program last night Incont of urine check PVR  5/31- voids incontinently- will increase Flomax  6/2- no voiding still per pt 9. GERD. Protonix 10. Hx of prediabetes- check an A1c and monitor as required.   5/21- can't find order- reordered for Monday- CBG 140s on BMP- since on  decadron- if goes higher, will monitor more regularly.  5/23- labs in AM- ordered CBC/BMP weekly  11. Hx of gout- this acute event could be a trigger for gout- will monitor.  5/26- no gout Sx's so far 12. Leukocytosis with UTI:  5/24: WBC up to 13.3. Afebrile and asymptomatic. Draw UA/UC given indwelling catheter and trend tomorrow.  5/26- >100k E coli, but sensitivities are pending.   5/27- Cx shows resistance to Keflex and WBC still 14k- will change to Bactrim DS BID x 7 days  5/28- changed to Rocephin since WBC up to 19k-down to 16.7 K   7 days of IV rocephin and check labs in AM E coli sensitive to rocephin  Minimal elevation of serum lactic acid, clinically bright , alert and participating in therapy , no clinical concerns for sepsis, will monitor    5/31- con't Rocephin for 7 days total 13. Hyperkalemia  5/27- went from 4.2 to 5.2- might be in error will recheck in AM  5/28- K+ 5.1- will recheck in AM 13.  Hyper K mild , received Lokelma yesterday, no obvious etiology  BMET in am   5/31- K+ 5.1- will recheck in week and  give Lokelma if required  6/2- K+ still 5.1  LOS: 13 days A FACE TO FACE EVALUATION WAS PERFORMED  Meredith Roach 09/13/2019, 8:04 AM

## 2019-09-13 NOTE — Progress Notes (Addendum)
Patient ID: Meredith Roach, female   DOB: 1941-02-08, 79 y.o.   MRN: [id number]   SW left message for [name] [phone]) to inform on d/c date, and encouraged follow-up to provide details from team conference. SW waiting on follow-up.  *SW received return phone call from pt husband. SW provided updates from team conference. SW also discussed family education. He states he will speak with his daughters about their schedule. SW encouraged multiple therapies days if possible.   Loralee Pacas, MSW, [place] Office: [phone] Cell: [phone] Fax: [phone]

## 2019-09-14 ENCOUNTER — Inpatient Hospital Stay (HOSPITAL_COMMUNITY): Payer: Medicare PPO | Admitting: Physical Therapy

## 2019-09-14 ENCOUNTER — Ambulatory Visit (HOSPITAL_COMMUNITY): Payer: Medicare PPO | Admitting: *Deleted

## 2019-09-14 ENCOUNTER — Inpatient Hospital Stay (HOSPITAL_COMMUNITY): Payer: Medicare PPO

## 2019-09-14 NOTE — Progress Notes (Signed)
Bowel program with dig stim was successful completed. No results to report. Will report to next shift. Amanda Cockayne, LPN

## 2019-09-14 NOTE — Progress Notes (Signed)
Occupational Therapy Session Note  Patient Details  Name: Meredith Roach MRN: [id number] Date of Birth: 1940/06/17  Today's Date: 09/14/2019 OT Individual Time: [id number] OT Individual Time Calculation (min): 75 min    Short Term Goals: Week 2:  OT Short Term Goal 1 (Week 2): Pt will be able to actively use hands to pull sleeves over arms. OT Short Term Goal 2 (Week 2): Pt will perform sit<>stand with RW with mod A in preparation for pulling pants over hips OT Short Term Goal 3 (Week 2): Pt will use her LUE/hand with mod A particiipating in bathng tasks OT Short Term Goal 4 (Week 2): Pt will perform 1/3 toileting tasks with max A  Skilled Therapeutic Interventions/Progress Updates:    Co-treatment with Recreational Therapist. Pt resting in bed upon arrival and ready to get OOB.  Pt declined changing shirt this morning.  Supine>sit EOB with max A. Sitting balance EOB with min A. Pt donned pants with sit<>stand from EOB.  Pt dependent to complete task.  Sit<>stand with Stedy from EOB with mod A. Pt transitioned to gym and transferred to [facility].  Pt engaged in unsupported sitting tasks initally to catch and toss small beach ball to Rec Therapist.  Pt maintained sitting balance with min A and multiple rest breaks.  Pt also engaged in game of checkers with focus on moving checker tokens and picking them up to remove from board or stack. Pt noted with improved sitting balance as noted with anterior and posterior lean without assistance while moving tokens. Pt returned to w/c with Stedy.  Pt remained in w/c in room with all needs within reach.  Therapy Documentation Precautions:  Precautions Precautions: Fall, Cervical Precaution Booklet Issued: No Precaution Comments: reviewed cervical precautions Required Braces or Orthoses: Cervical Brace Cervical Brace: Hard collar Restrictions Weight Bearing Restrictions: No  Pain: Pt denies pain this morning   Therapy/Group: Individual  Therapy  Leroy Libman 09/14/2019, 10:43 AM

## 2019-09-14 NOTE — Progress Notes (Signed)
[place] PHYSICAL MEDICINE & REHABILITATION PROGRESS NOTE   Subjective/Complaints:   No voiding yet, still- wants to wait until closer to d/c to decide about foley- to give flomax max time to help.   Feeling good- except shoulder- lidoderm helps.   Was fed by nursing- now wants to wash up and brush teeth.   ROS:  Pt denies SOB, abd pain, CP, N/V/C/D, and vision changes   Objective:   No results found. No results for input(s): WBC, HGB, HCT, PLT in the last 72 hours. Recent Labs    09/13/19 1405  NA 138  K 5.0  CL 103  CO2 25  GLUCOSE 177*  BUN 27*  CREATININE 0.94  CALCIUM 8.7*    Intake/Output Summary (Last 24 hours) at 09/14/2019 1009 Last data filed at 09/14/2019 0800 Gross per 24 hour  Intake 820 ml  Output --  Net 820 ml     Physical Exam: Vital Signs Blood pressure (!) 126/55, pulse 69, temperature 97.9 F (36.6 C), temperature source Oral, resp. rate 16, height 5\' 3"  (1.6 m), weight 76.8 kg, SpO2 97 %.  Physical Exam  General: awake, sitting up in bed; just finished breakfast, NAD Mood and affect are appropriate Heart: RRR Lungs: CTA b/l Abdomen: Soft, NT, ND, (+)BS  Extremities: No clubbing, cyanosis, or edema- R hand trace swelling  RUE- biceps 5-/5, WE 3+/5, triceps 4/5, grip 2/5, finger 1/5 LUE- biceps 4-/5, WE 1/5, triceps 3+/5, grip 2-/5, finger abd 1/5 RLE- HF 2/5, KE 4-/5, DF 3/5, PF 4/5, EHL 3/5 LLE- HF 2/5, KE 4-/5, DF 1/5, PF 4-/5, EHL 3/5  Neurological: ox3 C4 intact; C5 intact on R; decreased on L; C6-T1 decreased B/L, but absent on L Can ID LT on feet and knees  bilaterally  Skin: Skin iswarmand dry.Ktape on R hand/wrist still - no change IV site CDI  Psychiatric:bright affect   Assessment/Plan: 1. 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  Physiatrist is providing close team supervision and 24 hour  management of active medical problems listed below.  Physiatrist and rehab team continue to assess barriers to discharge/monitor patient progress toward functional and medical goals  Care Tool:  Bathing        Body parts bathed by helper: Right arm, Left arm, Chest, Abdomen, Front perineal area, Buttocks, Right upper leg, Left upper leg, Right lower leg, Left lower leg, Face     Bathing assist Assist Level: Total Assistance - Patient < 25%     Upper Body Dressing/Undressing Upper body dressing   What is the patient wearing?: Hospital gown only    Upper body assist Assist Level: Dependent - Patient 0%    Lower Body Dressing/Undressing Lower body dressing      What is the patient wearing?: Pants     Lower body assist Assist for lower body dressing: Maximal Assistance - Patient 25 - 49%     Toileting Toileting Toileting Activity did not occur (Clothing management and hygiene only): (pt is incontinent, not safe to sit on a BSC due to poor trunk control)  Toileting assist Assist for toileting: Moderate Assistance - Patient 50 - 74%     Transfers Chair/bed transfer  Transfers assist     Chair/bed transfer assist level: Maximal Assistance - Patient 25 - 49%(slide board)     Locomotion Ambulation   Ambulation assist   Ambulation activity did not occur: Safety/medical concerns  Assist level: 2 helpers Assistive device: Ethelene Hal Max distance: 5'   Walk 10 feet activity   Assist  Walk 10 feet activity did not occur: Safety/medical concerns        Walk 50 feet activity   Assist Walk 50 feet with 2 turns activity did not occur: Safety/medical concerns         Walk 150 feet activity   Assist Walk 150 feet activity did not occur: Safety/medical concerns         Walk 10 feet on uneven surface  activity   Assist Walk 10 feet on uneven surfaces activity did not occur: Safety/medical concerns         Wheelchair     Assist Will  patient use wheelchair at discharge?: Yes Type of Wheelchair: (TBD)    Wheelchair assist level: Dependent - Patient 0% Max wheelchair distance: 150'    Wheelchair 50 feet with 2 turns activity    Assist        Assist Level: Dependent - Patient 0%   Wheelchair 150 feet activity     Assist      Assist Level: Dependent - Patient 0%   Blood pressure (!) 126/55, pulse 69, temperature 97.9 F (36.6 C), temperature source Oral, resp. rate 16, height 5\' 3"  (1.6 m), weight 76.8 kg, SpO2 97 %.  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 Status post ACDF 08/28/2019. Decadron taper. Cervical collar as directed. -need to order PRAFOs to prevent Ankle contractures -still on Decadron taper -patient may shower  5/22- improvement daily bodes well- explained that pt to /daughter  5/23- will discuss foley vs in/out caths with pt/daughter today vs Tuesday;   5/25- will try Flomax and remove foley Thursday to see if can void.  5/31- wean Decadron dosing over next week or so - ordered  6/3- strength doing slightly better, esp in legs -ELOS/Goals: 4 weeks- goals min-mod assist  -Continue CIR 2. Antithrombotics: -DVT/anticoagulation:SCDs. Check vascular study and check with NSU to see if can start Lovenox 5/21- Dopplers (-) for DVT- need to see when can start Lovenox 5/23- surgery was 5/17- hopefully NSU will let us start 5/27? 5/26- will call to see if can start Lovenox- has been 9 days 6/3- has been on lovenox since 5/26 -antiplatelet therapy: N/A 3. Pain Management:Baclofen 5 mg 3 times daily- will likely need to increase, oxycodone as needed  5/22- not complaining of pain at all- might need to titrate baclofen after a few days.   5/25- will d/w pt need to increase Baclofen? Spasms?  5/28- pt reports spasms doing better- con't  current dose- don't increase Baclofen- add lidoderm patch to R shoulder 9am-9pm  6/3- pain controlled 4. Mood:Provide emotional support -antipsychotic agents: N/A 5. Neuropsych: This patientiscapable of making decisions on herown behalf. 6. Skin/Wound Care:Routine skin checksand turn while in bed every 2 hours while awake- keep on sides at night. 7. Fluids/Electrolytes/Nutrition:Routine in and outs with follow-up chemistries  5/24: BMP stable 8. Neurogenic bowel and bladder. con't foley until get her bowels cleaned out- can remove foley IF pt wants to d/c, and is able to do on own at home- if not, might need foley long term;. [place] program. Provide education.  5/27- remove foley today; wrote orders; good Bms last night  5.28- thinks peed, but was incontinent- BM with bowel program last night Incont of urine check PVR  5/31- voids incontinently- will increase Flomax  6/2- no voiding  still per pt  6/3- no voiding- wants to con't in/out caths until near d/c to give flomax time to work.  9. GERD. Protonix 10. Hx of prediabetes- check an A1c and monitor as required.   5/21- can't find order- reordered for Monday- CBG 140s on BMP- since on decadron- if goes higher, will monitor more regularly.  5/23- labs in AM- ordered CBC/BMP weekly  11. Hx of gout- this acute event could be a trigger for gout- will monitor.  5/26- no gout Sx's so far 12. Leukocytosis with UTI:  5/24: WBC up to 13.3. Afebrile and asymptomatic. Draw UA/UC given indwelling catheter and trend tomorrow.  5/26- >100k E coli, but sensitivities are pending.   5/27- Cx shows resistance to Keflex and WBC still 14k- will change to Bactrim DS BID x 7 days  5/28- changed to Rocephin since WBC up to 19k-down to 16.7 K   7 days of IV rocephin and check labs in AM E coli sensitive to rocephin  Minimal elevation of serum lactic acid, clinically bright , alert and participating in therapy , no clinical  concerns for sepsis, will monitor    5/31- con't Rocephin for 7 days total 13. Hyperkalemia  5/27- went from 4.2 to 5.2- might be in error will recheck in AM  5/28- K+ 5.1- will recheck in AM 13.  Hyper K mild , received Lokelma yesterday, no obvious etiology  BMET in am   5/31- K+ 5.1- will recheck in week and give Lokelma if required  6/2- K+ still 5.1  6/3- K+ 5.0- is now in nml range  LOS: 14 days A FACE TO FACE EVALUATION WAS PERFORMED  Myrna Vonseggern 09/14/2019, 10:09 AM

## 2019-09-14 NOTE — Progress Notes (Signed)
Physical Therapy Session Note  Patient Details  Name: Meredith Roach MRN: [id number] Date of Birth: 12/22/1940  Today's Date: 09/14/2019 PT Individual Time: 1115-1200; 1430-1455 PT Individual Time Calculation (min): 45 min and 25 min  Short Term Goals: Week 2:  PT Short Term Goal 1 (Week 2): Pt will perform least restrictive transfer with max A consistently PT Short Term Goal 2 (Week 2): Pt will initiate gait training PT Short Term Goal 3 (Week 2): Pt will maintain static sitting balance with close SBA x 5 min  Skilled Therapeutic Interventions/Progress Updates:    Session 1: Pt received supine in bed with CC in place. Agreeable to therapy. No complaints of pain. Supine to sit via log roll with max A d/t BLE management and decreased trunk strength/control. Sit to stand with mod A to [place]. Stedy transfer to [place]. Dependent transfer to and from therapy gym via [place]. Stedy transfer to therapy mat. Initiated stepping using Maxi Sky ceiling lift with pt standing at a platform rolling walker. Practiced stepping left foot forward then back, followed by stepping right foot forward then back. Patient was able to perform 4 total steps before quickly fatiguing d/t LE weakness and decreased endurance. Frequent verbal and tactile cues required throughout for foot placement, foot clearance, and foot advancement. Patient then performed lateral leans (5 repetitions to each side) on the therapy mat with mod assist coming up from the R and max assist coming up from the L. Stedi transfer to [employer]. Pt returned to room and left sitting in TIS W/C with water and call bell in reach. All questions answered.  Session 2: Pt received sitting in TIS W/C with daughter at bedside. Agreeable to therapy. No complaints of pain. Transferred TIS W/C to bed via transfer board with max A. Frequent verbal and tactile cues required for hand placement on transfer board and maintenance of forward lean while completing transfer.  Pt required 1-2 minutes of rest following transfer d/t LE weakness and decreased endurance. Transferred bed to TIS W/C via transfer board with max A. Verbal cues required for hand placement on transfer board. Pt left sitting in TIS W/C with all needs in reach. Daughter at bedside. All questions answered.  Therapy Documentation Precautions:  Precautions Precautions: Fall, Cervical Precaution Booklet Issued: No Precaution Comments: reviewed cervical precautions Required Braces or Orthoses: Cervical Brace Cervical Brace: Hard collar Restrictions Weight Bearing Restrictions: No General: PT Amount of Missed Time (min): 15 Minutes PT Missed Treatment Reason: Nursing care(I/O cathing)    Therapy/Group: Individual Therapy   Excell Seltzer, PT, DPT  09/14/2019, 5:01 PM

## 2019-09-14 NOTE — Plan of Care (Signed)
°  Problem: Consults Goal: RH GENERAL PATIENT EDUCATION Description: See Patient Education module for education specifics. Outcome: Progressing Goal: Skin Care Protocol Initiated - if Braden Score 18 or less Description: N/A Outcome: Progressing   Problem: RH BOWEL ELIMINATION Goal: RH STG MANAGE BOWEL WITH ASSISTANCE Description: STG Manage Bowel with min I Assistance. Outcome: Progressing   Problem: RH BLADDER ELIMINATION Goal: RH STG MANAGE BLADDER WITH EQUIPMENT WITH ASSISTANCE Description: STG Manage Bladder With Equipment With Min I Assistance Outcome: Progressing   Problem: RH SKIN INTEGRITY Goal: RH STG SKIN FREE OF INFECTION/BREAKDOWN Description: Remain free of breakdown during admission Outcome: Progressing Goal: RH STG MAINTAIN SKIN INTEGRITY WITH ASSISTANCE Description: STG Maintain Skin Integrity With Min I Assistance. Outcome: Progressing Goal: RH STG ABLE TO PERFORM INCISION/WOUND CARE W/ASSISTANCE Description: STG Able To Perform Incision/Wound Care With Min I Assistance. Outcome: Progressing   Problem: RH SAFETY Goal: RH STG ADHERE TO SAFETY PRECAUTIONS W/ASSISTANCE/DEVICE Description: STG Adhere to Safety Precautions With Mod  Assistance Outcome: Progressing   Problem: RH PAIN MANAGEMENT Goal: RH STG PAIN MANAGED AT OR BELOW PT'S PAIN GOAL Description: Pain goal of 3 on pain scale of 0-10 Outcome: Progressing   

## 2019-09-14 NOTE — Progress Notes (Signed)
Occupational Therapy Session Note  Patient Details  Name: KARALYNN HASSE MRN: [id number] Date of Birth: Nov 09, 1940  Today's Date: 09/14/2019 OT Individual Time: 1400-1425 OT Individual Time Calculation (min): 25 min    Short Term Goals: Week 2:  OT Short Term Goal 1 (Week 2): Pt will be able to actively use hands to pull sleeves over arms. OT Short Term Goal 2 (Week 2): Pt will perform sit<>stand with RW with mod A in preparation for pulling pants over hips OT Short Term Goal 3 (Week 2): Pt will use her LUE/hand with mod A particiipating in bathng tasks OT Short Term Goal 4 (Week 2): Pt will perform 1/3 toileting tasks with max A  Skilled Therapeutic Interventions/Progress Updates:    Pt resting in w/c upon arrival.  OT intervention with focus on B hand/grasp strengthening with theraputty.  Pt noted with slightly improved L hand grasp/finger flexion but no extension.  Pt initially challenged with rolling putty into long cylinder with both hands.  Pt unable to extend fingers on L hand and unable to complete task.  Pt completed tasks with putty "punching" fingers into putty while therapist held putty. Pt required multiple rest breaks during session to allow for recover after exertion.  Pt remained in w/c with all needs within reach and daughter present.   Therapy Documentation Precautions:  Precautions Precautions: Fall, Cervical Precaution Booklet Issued: No Precaution Comments: reviewed cervical precautions Required Braces or Orthoses: Cervical Brace Cervical Brace: Hard collar Restrictions Weight Bearing Restrictions: No Pain:  Pt denies pain this afternoon   Therapy/Group: Individual Therapy  Leroy Libman 09/14/2019, 2:47 PM

## 2019-09-14 NOTE — Evaluation (Signed)
Recreational Therapy Assessment and Plan  Patient Details  Name: Meredith Roach MRN: [id number] Date of Birth: 09/29/1940 Today's Date: 09/14/2019  Rehab Potential:  Good ELOS:   d/c 6/18  Assessment   Problem List:      Patient Active Problem List   Diagnosis Date Noted  . Quadriparesis (HCC) 08/31/2019  . S/P cervical spinal fusion 08/28/2019  . 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 Radiculopathy 10/21/2016    Past Medical History:      Past Medical History:  Diagnosis Date  . Arthritis   . GERD (gastroesophageal reflux disease)   . Gout   . Neuromuscular disorder (HCC)   . Pre-diabetes    Past Surgical History:       Past Surgical History:  Procedure Laterality Date  . ANTERIOR CERVICAL DECOMPRESSION/DISCECTOMY FUSION 4 LEVELS N/A 08/28/2019   Procedure: ANTERIOR CERVICAL DECOMPRESSION/DISCECTOMY FUSION CERVICALTHREE-FOUR CERVICAL,FOUR-FIVE,CERVICAL FIVE-SIX,CERVICAL SIX-SEVEN.;  Surgeon: Jones, David S, MD;  Location: [facility] OR;  Service: Neurosurgery;  Laterality: N/A;  ANTERIOR  . BACK SURGERY    . CATARACT EXTRACTION W/PHACO Right 03/31/2018   Procedure: CATARACT EXTRACTION PHACO AND INTRAOCULAR LENS PLACEMENT (IOC);  Surgeon: Harrow, Brian, MD;  Location: [facility] ORS;  Service: Ophthalmology;  Laterality: Right;  US 00:39.7 CDE 4.35 Fluid Pack Lot # [id number]  . CHOLECYSTECTOMY  1995  . COLONOSCOPY WITH PROPOFOL N/A 06/07/2017   Procedure: COLONOSCOPY WITH PROPOFOL;  Surgeon: Elliott, Robert T, MD;  Location: [facility] ENDOSCOPY;  Service: Endoscopy;  Laterality: N/A;  . ESOPHAGOGASTRODUODENOSCOPY (EGD) WITH PROPOFOL N/A 06/07/2017   Procedure: ESOPHAGOGASTRODUODENOSCOPY (EGD) WITH PROPOFOL;  Surgeon: Elliott, Robert T, MD;  Location: [facility] ENDOSCOPY;  Service: Endoscopy;  Laterality: N/A;  . REDUCTION MAMMAPLASTY Bilateral 1994    Assessment & Plan Clinical Impression:  Meredith Roach is a 78 y.o.right handed  female with a history of gout as well as pre-diabetes.Per chart review she lives with spouse in [place].Independent prior to admission. 1 level home. Husband can assist. Presented 08/26/2019 after ground-level fall with resulting quadriparesis. 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 CT/MRI of the brain no acute intracranial abnormality. Admission chemistries unremarkable except glucose 123, CK of 93. Patient underwent decompressive anterior cervical discectomy C3-4 4-5, C5-6 and C6-7 with anterior cervical arthrodesis 08/28/2019 per Dr. David Jones. Decadron protocol as indicated. Aspen cervical collar applied as directed. Therapy evaluations completed and patient was admitted for a comprehensive rehab program. Patient transferred to CIR on 08/31/2019 .   Pt presents with decreased activity tolerance, decreased functional mobility, decreased balance, decreased coordination Limiting pt's independence with leisure/community pursuits.   Plan  Min 1 TR session per week >25 minutes Recommendations for other services: None   Discharge Criteria: Patient will be discharged from TR if patient refuses treatment 3 consecutive times without medical reason.  If treatment goals not met, if there is a change in medical status, if patient makes no progress towards goals or if patient is discharged from hospital.  The above assessment, treatment plan, treatment alternatives and goals were discussed and mutually agreed upon: by patient  SIMPSON,LISA 09/14/2019, 9:39 AM  

## 2019-09-15 ENCOUNTER — Inpatient Hospital Stay (HOSPITAL_COMMUNITY): Payer: Medicare PPO | Admitting: *Deleted

## 2019-09-15 ENCOUNTER — Inpatient Hospital Stay (HOSPITAL_COMMUNITY): Payer: Medicare PPO | Admitting: Physical Therapy

## 2019-09-15 ENCOUNTER — Inpatient Hospital Stay (HOSPITAL_COMMUNITY): Payer: Medicare PPO

## 2019-09-15 LAB — BASIC METABOLIC PANEL
Anion gap: 9 (ref 5–15)
BUN: 24 mg/dL — ABNORMAL HIGH (ref 8–23)
CO2: 28 mmol/L (ref 22–32)
Calcium: 8.6 mg/dL — ABNORMAL LOW (ref 8.9–10.3)
Chloride: 101 mmol/L (ref 98–111)
Creatinine, Ser: 0.83 mg/dL (ref 0.44–1.00)
GFR calc Af Amer: >60 mL/min (ref >60)
GFR calc non Af Amer: >60 mL/min (ref >60)
Glucose, Bld: 117 mg/dL — ABNORMAL HIGH (ref 70–99)
Potassium: 5.1 mmol/L (ref 3.5–5.1)
Sodium: 138 mmol/L (ref 135–145)

## 2019-09-15 NOTE — Progress Notes (Signed)
Physical Therapy Weekly Progress Note  Patient Details  Name: Meredith Roach MRN: [id number] Date of Birth: [birth year]  Beginning of progress report period: Sep 08, 2019 End of progress report period: September 15, 2019  Today's Date: 09/15/2019 PT Individual Time: 0845-1000 PT Individual Time Calculation (min): 75 min   Patient has met 1 of 3 short term goals.  Pt is making slow progress towards therapy goals. She is currently mod to max A for bed mobility, min to max A to stand to stedy, transfers with stedy vs max A with SB, and is dependent for short distance gait with use of Eva walker and skilled hands-on assist x 3.  Patient continues to demonstrate the following deficits muscle weakness, decreased cardiorespiratoy endurance, abnormal tone and unbalanced muscle activation and decreased sitting balance, decreased standing balance, decreased postural control and decreased balance strategies and therefore will continue to benefit from skilled PT intervention to increase functional independence with mobility.  Patient progressing toward long term goals..  Continue plan of care.  PT Short Term Goals Week 2:  PT Short Term Goal 1 (Week 2): Pt will perform least restrictive transfer with max A consistently PT Short Term Goal 1 - Progress (Week 2): Progressing toward goal PT Short Term Goal 2 (Week 2): Pt will initiate gait training PT Short Term Goal 2 - Progress (Week 2): Met PT Short Term Goal 3 (Week 2): Pt will maintain static sitting balance with close SBA x 5 min PT Short Term Goal 3 - Progress (Week 2): Progressing toward goal Week 3:  PT Short Term Goal 1 (Week 3): Pt will complete least restrictive transfer with max A consistently PT Short Term Goal 2 (Week 3): Pt will ambulate x 10 ft with LRAD PT Short Term Goal 3 (Week 3): Pt will initiate w/c mobility  Skilled Therapeutic Interventions/Progress Updates:    Pt received seated in bed, agreeable to PT session. No complaints of pain.  Semi-reclined to sitting EOB with max A for BLE management and trunk elevated. Stedy transfer bed to w/c with max A to stand to stedy. Pt is dependent to don tennis shoes while seated in w/c. Dependent transport w/c to/from therapy gym for time conservation. Sit to stand in standing frame. While in standing decreased support from sling and pt able to maintain upright stance with min A. Standing mini-squats x 10 reps, x 5 reps to fatigue. While in standing attempt alt LE lifts, pt unable to lift LE due to hip flexor weakness and exhibits significant compensatory strategies in attempt to lift LE. Standing heel raises 2 x 10 reps to fatigue. Stedy transfer w/c to/from mat table. Sit to supine mod A for BLE management. Rolling L/R with min A and cueing for LE placement. While in sidelying on mat table pt able to perform active to active-assisted hip flexion and extension with use of powder board. Pt exhibits L hip flexor weakness > R hip flexor weakness. Returned to sitting EOM with assist x 2 from flat mat table for BLE management and trunk elevation. Stedy transfer back to w/c. Pt left semi-reclined in TIS w/c in room with needs in reach at end of session.  Therapy Documentation Precautions:  Precautions Precautions: Fall, Cervical Precaution Booklet Issued: No Precaution Comments: reviewed cervical precautions Required Braces or Orthoses: Cervical Brace Cervical Brace: Hard collar Restrictions Weight Bearing Restrictions: No   Therapy/Group: Individual Therapy   Excell Seltzer, PT, DPT  09/15/2019, 3:25 PM

## 2019-09-15 NOTE — Progress Notes (Signed)
[facility] PHYSICAL MEDICINE & REHABILITATION PROGRESS NOTE   Subjective/Complaints:   Still no voiding- slept well and so feeling better this AM.   Fed self with built up handles really well this AM.  Spasms much better- none in 2+ days.   Had good result with bowel program last night.   ROS:  Pt denies SOB, abd pain, CP, N/V/C/D, and vision changes   Objective:   No results found. No results for input(s): WBC, HGB, HCT, PLT in the last 72 hours. Recent Labs    09/13/19 1405  NA 138  K 5.0  CL 103  CO2 25  GLUCOSE 177*  BUN 27*  CREATININE 0.94  CALCIUM 8.7*    Intake/Output Summary (Last 24 hours) at 09/15/2019 0915 Last data filed at 09/15/2019 0800 Gross per 24 hour  Intake 760 ml  Output 2025 ml  Net -1265 ml     Physical Exam: Vital Signs Blood pressure (!) 121/52, pulse 76, temperature 97.7 F (36.5 C), temperature source Oral, resp. rate 18, height 5\' 3"  (1.6 m), weight 76.8 kg, SpO2 97 %.  Physical Exam  General: sitting up in bed- OT at bedside, NAD Mood and affect are appropriate Heart: RRR Lungs: CTA B/L- no W/R/R- good air movement Abdomen: Soft, NT, ND, (+)BS  Extremities: No clubbing, cyanosis, or edema- R hand trace swelling  RUE- biceps 5-/5, WE 3+/5, triceps 4/5, grip 2/5, finger 1/5 LUE- biceps 4-/5, WE 1/5, triceps 3+/5, grip 2-/5, finger abd 1/5 RLE- HF 2/5, KE 4-/5, DF 3/5, PF 4/5, EHL 3/5 LLE- HF 2/5, KE 4-/5, DF 1/5, PF 4-/5, EHL 3/5  Neurological: Ox3 C4 intact; C5 intact on R; decreased on L; C6-T1 decreased B/L, but absent on L Can ID LT on feet and knees  bilaterally  Skin: Skin iswarmand dry.Ktape on R hand/wrist still - no change IV site CDI  Psychiatric:bright affect   Assessment/Plan: 1. 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  Physiatrist is providing close team supervision and 24 hour  management of active medical problems listed below.  Physiatrist and rehab team continue to assess barriers to discharge/monitor patient progress toward functional and medical goals  Care Tool:  Bathing        Body parts bathed by helper: Right arm, Left arm, Chest, Abdomen, Front perineal area, Buttocks, Right upper leg, Left upper leg, Right lower leg, Left lower leg, Face     Bathing assist Assist Level: Total Assistance - Patient < 25%     Upper Body Dressing/Undressing Upper body dressing   What is the patient wearing?: Hospital gown only    Upper body assist Assist Level: Dependent - Patient 0%    Lower Body Dressing/Undressing Lower body dressing      What is the patient wearing?: Pants     Lower body assist Assist for lower body dressing: Dependent - Patient 0%     Toileting Toileting Toileting Activity did not occur (Clothing management and hygiene only): (pt is incontinent, not safe to sit on a BSC due to poor trunk control)  Toileting assist Assist for toileting: Moderate Assistance - Patient 50 - 74%     Transfers Chair/bed transfer  Transfers assist     Chair/bed transfer assist level: Dependent - mechanical lift     Locomotion Ambulation   Ambulation assist   Ambulation activity did not occur: Safety/medical concerns  Assist level: 2 helpers  Assistive device: Trixie Dredge distance: 5'   Walk 10 feet activity   Assist  Walk 10 feet activity did not occur: Safety/medical concerns        Walk 50 feet activity   Assist Walk 50 feet with 2 turns activity did not occur: Safety/medical concerns         Walk 150 feet activity   Assist Walk 150 feet activity did not occur: Safety/medical concerns         Walk 10 feet on uneven surface  activity   Assist Walk 10 feet on uneven surfaces activity did not occur: Safety/medical concerns         Wheelchair     Assist Will patient use wheelchair at discharge?:  Yes Type of Wheelchair: (TBD)    Wheelchair assist level: Dependent - Patient 0% Max wheelchair distance: 150'    Wheelchair 50 feet with 2 turns activity    Assist        Assist Level: Dependent - Patient 0%   Wheelchair 150 feet activity     Assist      Assist Level: Dependent - Patient 0%   Blood pressure (!) 121/52, pulse 76, temperature 97.7 F (36.5 C), temperature source Oral, resp. rate 18, height 5\' 3"  (1.6 m), weight 76.8 kg, SpO2 97 %.  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 Status post ACDF 08/28/2019. Decadron taper. Cervical collar as directed. -need to order PRAFOs to prevent Ankle contractures -still on Decadron taper -patient may shower  5/22- improvement daily bodes well- explained that pt to /daughter  5/23- will discuss foley vs in/out caths with pt/daughter today vs Tuesday;   5/25- will try Flomax and remove foley Thursday to see if can void.  5/31- wean Decadron dosing over next week or so - ordered  6/3- strength doing slightly better, esp in legs -ELOS/Goals: 4 weeks- goals min-mod assist  -Continue CIR 2. Antithrombotics: -DVT/anticoagulation:SCDs. Check vascular study and check with [facility] to see if can start Lovenox 5/21- Dopplers (-) for DVT- need to see when can start Lovenox 5/23- surgery was 5/17- hopefully [facility] will let us start 5/27? 5/26- will call to see if can start Lovenox- has been 9 days 6/3- has been on lovenox since 5/26 -antiplatelet therapy: N/A 3. Pain Management:Baclofen 5 mg 3 times daily- will likely need to increase, oxycodone as needed  5/22- not complaining of pain at all- might need to titrate baclofen after a few days.   5/25- will d/w pt need to increase Baclofen? Spasms?  5/28- pt reports spasms doing better- con't current dose- don't increase Baclofen-  add lidoderm patch to R shoulder 9am-9pm  6/4- pain controlled and spasms doing "great" 4. Mood:Provide emotional support -antipsychotic agents: N/A 5. Neuropsych: This patientiscapable of making decisions on herown behalf. 6. Skin/Wound Care:Routine skin checksand turn while in bed every 2 hours while awake- keep on sides at night. 7. Fluids/Electrolytes/Nutrition:Routine in and outs with follow-up chemistries  5/24: BMP stable 8. Neurogenic bowel and bladder. con't foley until get her bowels cleaned out- can remove foley IF pt wants to d/c, and is able to do on own at home- if not, might need foley long term;. [place] program. Provide education.  5/27- remove foley today; wrote orders; good Bms last night  5.28- thinks peed, but was incontinent- BM with bowel program last night Incont of urine check PVR  5/31- voids incontinently- will increase Flomax  6/2- no voiding  still per pt  6/3- no voiding- wants to con't in/out caths until near d/c to give flomax time to work.   6/4- no voiding yet- good BM last night with bowel program 9. GERD. Protonix 10. Hx of prediabetes- check an A1c and monitor as required.   5/21- can't find order- reordered for Monday- CBG 140s on BMP- since on decadron- if goes higher, will monitor more regularly.  5/23- labs in AM- ordered CBC/BMP weekly  11. Hx of gout- this acute event could be a trigger for gout- will monitor.  5/26- no gout Sx's so far 12. Leukocytosis with UTI:  5/24: WBC up to 13.3. Afebrile and asymptomatic. Draw UA/UC given indwelling catheter and trend tomorrow.  5/26- >100k E coli, but sensitivities are pending.   5/27- Cx shows resistance to Keflex and WBC still 14k- will change to Bactrim DS BID x 7 days  5/28- changed to Rocephin since WBC up to 19k-down to 16.7 K   7 days of IV rocephin and check labs in AM E coli sensitive to rocephin  Minimal elevation of serum lactic acid, clinically bright , alert and  participating in therapy , no clinical concerns for sepsis, will monitor    5/31- con't Rocephin for 7 days total 13. Hyperkalemia  5/27- went from 4.2 to 5.2- might be in error will recheck in AM  5/28- K+ 5.1- will recheck in AM 13.  Hyper K mild , received Lokelma yesterday, no obvious etiology  BMET in am   5/31- K+ 5.1- will recheck in week and give Lokelma if required  6/2- K+ still 5.1  6/3- K+ 5.0- is now in nml range  LOS: 15 days A FACE TO FACE EVALUATION WAS PERFORMED  Meredith Roach 09/15/2019, 9:15 AM

## 2019-09-15 NOTE — Progress Notes (Signed)
Occupational Therapy Session Note  Patient Details  Name: Meredith Roach MRN: [id number] Date of Birth: 1941/02/27  Today's Date: 09/15/2019 OT Individual Time: 0700-0809 OT Individual Time Calculation (min): 69 min    Short Term Goals: Week 2:  OT Short Term Goal 1 (Week 2): Pt will be able to actively use hands to pull sleeves over arms. OT Short Term Goal 2 (Week 2): Pt will perform sit<>stand with RW with mod A in preparation for pulling pants over hips OT Short Term Goal 3 (Week 2): Pt will use her LUE/hand with mod A particiipating in bathng tasks OT Short Term Goal 4 (Week 2): Pt will perform 1/3 toileting tasks with max A  Skilled Therapeutic Interventions/Progress Updates:    Pt resting in bed upon arrival and agreeable to donning pants at bed level and getting OOB.  OT intervention with focus on bed mobility, sittin balance, sit<>stand, self feeding, and grooming tasks to increase independence with BADLs. Pt required min A for rolling in bed to facilitate pulling pants over hips. Max A for supine>sit EOB (pt initiates moving BLE off EOB). Sit<>stand in [place] for transfer to w/c with mod A. Pt used spoon with red foam for eating breakfast potatoes. Pt able to pick up slices of quesadilla and grasp adequately to bite off portions. Pt used cup with handle and coban to drink her tea.  Pt requires CGA/touching assist to complete breakfast while seated in w/c. Pt able to wash face and brush teeth at sink with setup. Pt remained seated in TIS w/c with all needs within reach.  Therapy Documentation Precautions:  Precautions Precautions: Fall, Cervical Precaution Booklet Issued: No Precaution Comments: reviewed cervical precautions Required Braces or Orthoses: Cervical Brace Cervical Brace: Hard collar Restrictions Weight Bearing Restrictions: No General:   Vital Signs: Therapy Vitals Temp: 97.7 F (36.5 C) Temp Source: Oral Pulse Rate: 76 Resp: 18 BP: (!) 121/52 Patient  Position (if appropriate): Lying Oxygen Therapy SpO2: 97 % O2 Device: Room Air Pain:   ADL: ADL Eating: Minimal assistance Grooming: Moderate assistance Upper Body Bathing: Maximal assistance(with hand over hand A to hold washcloth) Lower Body Bathing: Dependent Upper Body Dressing: Dependent Where Assessed-Upper Body Dressing: Edge of bed Lower Body Dressing: Dependent Where Assessed-Lower Body Dressing: Bed level Toilet Transfer: Unable to assess Tub/Shower Transfer Method: Unable to assess Vision   Perception    Praxis   Exercises:   Other Treatments:     Therapy/Group: Individual Therapy  Leroy Libman 09/15/2019, 8:19 AM

## 2019-09-15 NOTE — Progress Notes (Signed)
Bowel program with dig stim was started at 1926. No results to report. Reported to night shift nurse. Amanda Cockayne, LPN

## 2019-09-15 NOTE — Plan of Care (Signed)
  Problem: Consults Goal: RH GENERAL PATIENT EDUCATION Description: See Patient Education module for education specifics. Outcome: Progressing Goal: Skin Care Protocol Initiated - if Braden Score 18 or less Description: N/A Outcome: Progressing   Problem: RH BOWEL ELIMINATION Goal: RH STG MANAGE BOWEL WITH ASSISTANCE Description: STG Manage Bowel with min I Assistance. Outcome: Progressing   Problem: RH BLADDER ELIMINATION Goal: RH STG MANAGE BLADDER WITH EQUIPMENT WITH ASSISTANCE Description: STG Manage Bladder With Equipment With Min I Assistance Outcome: Progressing   Problem: RH SKIN INTEGRITY Goal: RH STG SKIN FREE OF INFECTION/BREAKDOWN Description: Remain free of breakdown during admission Outcome: Progressing Goal: RH STG MAINTAIN SKIN INTEGRITY WITH ASSISTANCE Description: STG Maintain Skin Integrity With Min I Assistance. Outcome: Progressing Goal: RH STG ABLE TO PERFORM INCISION/WOUND CARE W/ASSISTANCE Description: STG Able To Perform Incision/Wound Care With [place] I Assistance. Outcome: Progressing   Problem: RH SAFETY Goal: RH STG ADHERE TO SAFETY PRECAUTIONS W/ASSISTANCE/DEVICE Description: STG Adhere to Safety Precautions With Mod  Assistance Outcome: Progressing   Problem: RH PAIN MANAGEMENT Goal: RH STG PAIN MANAGED AT OR BELOW PT'S PAIN GOAL Description: Pain goal of 3 on pain scale of 0-10 Outcome: Progressing

## 2019-09-15 NOTE — Progress Notes (Signed)
Occupational Therapy Session Note  Patient Details  Name: Meredith Roach MRN: [id number] Date of Birth: [birth year]  Today's Date: 09/15/2019 OT Individual Time: 1115-1200 OT Individual Time Calculation (min): 45 min    Short Term Goals: Week 2:  OT Short Term Goal 1 (Week 2): Pt will be able to actively use hands to pull sleeves over arms. OT Short Term Goal 2 (Week 2): Pt will perform sit<>stand with RW with mod A in preparation for pulling pants over hips OT Short Term Goal 3 (Week 2): Pt will use her LUE/hand with mod A particiipating in bathng tasks OT Short Term Goal 4 (Week 2): Pt will perform 1/3 toileting tasks with max A  Skilled Therapeutic Interventions/Progress Updates:    Pt resting in w/c upon arrival and "ready" for therapy.  Pt transitioned to gym and transferred to [facility] with Stedy.  Sit<>stand in [place] with mod A.  Sitting balance EOM with BLE supported at supervision.  OT intervention with focus on LUE functional use and NMES to facilitate. NMES for wrist/finger extension to facilitate release of drinking cup after grasp.  1:1 NMES applied to LUE wrist and finger extensors to faciltate functional release of cup after bringing to mouth Ratio 1:3 Rate 35 pps Waveform- Asymmetric Ramp 1.0 Pulse 300 Intensity- 14 Duration -  10   Report of pain at the beginning of session 0/10 Report of pain at the end of session 0/10  No adverse reactions after treatment and is skin intact.   Sit<>stand from paddles on Stedy with supervision X 4  Sit<>stand from EOM with Stedy at mod A.  Pt returned to room and remained in w/c with all needs within reach and daughter present.   Therapy Documentation Precautions:  Precautions Precautions: Fall, Cervical Precaution Booklet Issued: No Precaution Comments: reviewed cervical precautions Required Braces or Orthoses: Cervical Brace Cervical Brace: Hard collar Restrictions Weight Bearing Restrictions: No  Pain:     Therapy/Group: Individual Therapy  Leroy Libman 09/15/2019, 12:09 PM

## 2019-09-16 DIAGNOSIS — T380X5A Adverse effect of glucocorticoids and synthetic analogues, initial encounter: Secondary | ICD-10-CM

## 2019-09-16 DIAGNOSIS — M792 Neuralgia and neuritis, unspecified: Secondary | ICD-10-CM

## 2019-09-16 DIAGNOSIS — K592 Neurogenic bowel, not elsewhere classified: Secondary | ICD-10-CM

## 2019-09-16 DIAGNOSIS — R7303 Prediabetes: Secondary | ICD-10-CM

## 2019-09-16 DIAGNOSIS — R739 Hyperglycemia, unspecified: Secondary | ICD-10-CM

## 2019-09-16 DIAGNOSIS — N319 Neuromuscular dysfunction of bladder, unspecified: Secondary | ICD-10-CM

## 2019-09-16 DIAGNOSIS — E875 Hyperkalemia: Secondary | ICD-10-CM

## 2019-09-16 MED ORDER — GABAPENTIN 100 MG PO CAPS
100.0000 mg | ORAL_CAPSULE | Freq: Two times a day (BID) | ORAL | Status: DC
  Administered 2019-09-16 – 2019-09-29 (×27): 100 mg via ORAL
  Filled 2019-09-16 (×27): qty 1

## 2019-09-16 NOTE — Progress Notes (Signed)
[facility] PHYSICAL MEDICINE & REHABILITATION PROGRESS NOTE   Subjective/Complaints: Patient seen sitting up in bed this morning.  She states she slept well overnight.  Daughter at bedside with questions regarding patient's infection, I/O caths, pain.  Patient does complain about burning pain in hands and feet.  ROS: + Burning in hands and feet.  Denies CP, shortness of breath, nausea, vomiting, diarrhea.  Objective:   No results found. No results for input(s): WBC, HGB, HCT, PLT in the last 72 hours. Recent Labs    09/13/19 1405 09/15/19 1225  NA 138 138  K 5.0 5.1  CL 103 101  CO2 25 28  GLUCOSE 177* 117*  BUN 27* 24*  CREATININE 0.94 0.83  CALCIUM 8.7* 8.6*    Intake/Output Summary (Last 24 hours) at 09/16/2019 1250 Last data filed at 09/16/2019 0830 Gross per 24 hour  Intake 520 ml  Output 1500 ml  Net -980 ml     Physical Exam: Vital Signs Blood pressure (!) 123/52, pulse 67, temperature 97.9 F (36.6 C), temperature source Oral, resp. rate 18, height 5\' 3"  (1.6 m), weight 76.8 kg, SpO2 99 %. Constitutional: No distress . Vital signs reviewed. HENT: Normocephalic.  Atraumatic. Neck: + C-collar Eyes: EOMI. No discharge. Cardiovascular: No JVD. Respiratory: Normal effort.  No stridor. GI: Non-distended. Skin: Warm and dry.  Intact. Psych: Normal mood.  Normal behavior. Musc: No edema in extremities.  No tenderness in extremities. Neuro: Alert  Motor RUE: Shoulder abduction, elbow flexion/extension 3/5, handgrip 2/5 Left upper extremity: Shoulder abduction, elbow flexion/extension 2/5, handgrip 1/5 Right lower extremity: Hip flexion, knee extension 3/5, ankle dorsiflexion 3+/5 Left lower extremity: Hip flexion, knee extension 2/5, ankle dorsiflexion 2/5 Sensation diminished to light touch in bilateral hands and feet  Assessment/Plan: 1. Functional deficits secondary to C3 ASIA C quadriplegia which require 3+ hours per day of interdisciplinary therapy in a  comprehensive inpatient rehab setting.  Physiatrist is providing close team supervision and 24 hour management of active medical problems listed below.  Physiatrist and rehab team continue to assess barriers to discharge/monitor patient progress toward functional and medical goals  Care Tool:  Bathing        Body parts bathed by helper: Right arm, Left arm, Chest, Abdomen, Front perineal area, Buttocks, Right upper leg, Left upper leg, Right lower leg, Left lower leg, Face     Bathing assist Assist Level: Total Assistance - Patient < 25%     Upper Body Dressing/Undressing Upper body dressing   What is the patient wearing?: Hospital gown only    Upper body assist Assist Level: Dependent - Patient 0%    Lower Body Dressing/Undressing Lower body dressing      What is the patient wearing?: Pants     Lower body assist Assist for lower body dressing: Dependent - Patient 0%     Toileting Toileting Toileting Activity did not occur (Clothing management and hygiene only): (pt is incontinent, not safe to sit on a BSC due to poor trunk control)  Toileting assist Assist for toileting: Moderate Assistance - Patient 50 - 74%     Transfers Chair/bed transfer  Transfers assist     Chair/bed transfer assist level: Dependent - mechanical lift     Locomotion Ambulation   Ambulation assist   Ambulation activity did not occur: Safety/medical concerns  Assist level: 2 helpers Assistive device: Ethelene Hal Max distance: 5'   Walk 10 feet activity   Assist  Walk 10 feet activity did not occur: Safety/medical concerns  Walk 50 feet activity   Assist Walk 50 feet with 2 turns activity did not occur: Safety/medical concerns         Walk 150 feet activity   Assist Walk 150 feet activity did not occur: Safety/medical concerns         Walk 10 feet on uneven surface  activity   Assist Walk 10 feet on uneven surfaces activity did not occur:  Safety/medical concerns         Wheelchair     Assist Will patient use wheelchair at discharge?: Yes Type of Wheelchair: (TBD)    Wheelchair assist level: Dependent - Patient 0% Max wheelchair distance: 150'    Wheelchair 50 feet with 2 turns activity    Assist        Assist Level: Dependent - Patient 0%   Wheelchair 150 feet activity     Assist      Assist Level: Dependent - Patient 0%   Blood pressure (!) 123/52, pulse 67, temperature 97.9 F (36.6 C), temperature source Oral, resp. rate 18, height 5\' 3"  (1.6 m), weight 76.8 kg, SpO2 99 %.  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 Status post ACDF 08/28/2019. Decadron taper. Cervical collar as directed. -PRAFOs to prevent Ankle contractures -Decadron taper, last dose today  Continue CIR  2. Antithrombotics: -DVT/anticoagulation:SCDs. Lovenox  -antiplatelet therapy: N/A 3. Pain Management:Baclofen 5 mg 3 times daily  oxycodone as needed  added lidoderm patch to R shoulder 9am-9pm  Gabapentin 100 twice daily started on 6/5 for neuropathic pain, titrate as necessary  4. Mood:Provide emotional support -antipsychotic agents: N/A 5. Neuropsych: This patientiscapable of making decisions on herown behalf. 6. Skin/Wound Care:Routine skin checksand turn while in bed every 2 hours while awake- keep on sides at night. 7. Fluids/Electrolytes/Nutrition:Routine in and outs 8. Neurogenic bowel and bladder.    [place] program. Provide education.  Foley DC'd, increased Flomax  Continue I/O caths 9. GERD. Protonix 10. Hx of prediabetes confounded by steroid-induced hyperglycemia-   Monitor off steroids 11. Hx of gout-controlled 12. Leukocytosis with UTI:  Bactrim changed to Rocephin, completed course 13. Hyperkalemia  Potassium 5.1 on  6/4, labs ordered for Monday  LOS: 16 days A FACE TO FACE EVALUATION WAS PERFORMED  Samoria Fedorko Lorie Phenix 09/16/2019, 12:50 PM

## 2019-09-17 ENCOUNTER — Inpatient Hospital Stay (HOSPITAL_COMMUNITY): Payer: Medicare PPO

## 2019-09-17 NOTE — Progress Notes (Signed)
[facility] PHYSICAL MEDICINE & REHABILITATION PROGRESS NOTE   Subjective/Complaints: Patient seen sitting up in bed this morning.  She states she slept well overnight.  She notes improvement with neuropathic pain with medications.  Discussed adjusting c-collar.  ROS: + Burning in hands and feet, improving.  Denies CP, shortness of breath, nausea, vomiting, diarrhea.  Objective:   No results found. No results for input(s): WBC, HGB, HCT, PLT in the last 72 hours. Recent Labs    09/15/19 1225  NA 138  K 5.1  CL 101  CO2 28  GLUCOSE 117*  BUN 24*  CREATININE 0.83  CALCIUM 8.6*    Intake/Output Summary (Last 24 hours) at 09/17/2019 0840 Last data filed at 09/17/2019 0650 Gross per 24 hour  Intake 780 ml  Output 1700 ml  Net -920 ml     Physical Exam: Vital Signs Blood pressure (!) 117/55, pulse 78, temperature 97.8 F (36.6 C), temperature source Oral, resp. rate 16, height 5\' 3"  (1.6 m), weight 76.8 kg, SpO2 98 %. Constitutional: No distress . Vital signs reviewed. HENT: Normocephalic.  Atraumatic. Neck: C-collar. Eyes: EOMI. No discharge. Cardiovascular: No JVD. Respiratory: Normal effort.  No stridor. GI: Non-distended. Skin: Warm and dry.  Intact. Psych: Normal mood.  Normal behavior. Musc: No edema in extremities.  No tenderness in extremities. Neuro: Alert Motor RUE: Shoulder abduction, elbow flexion/extension 3/5, handgrip 3/5 Left upper extremity: Shoulder abduction, elbow flexion/extension 2/5, handgrip 2/5 Right lower extremity: Hip flexion, knee extension 3/5, ankle dorsiflexion 3+/5 Left lower extremity: Hip flexion, knee extension 2/5, ankle dorsiflexion 2/5  Assessment/Plan: 1. 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  Physiatrist is providing close team supervision and 24 hour management of active medical problems listed  below.  Physiatrist and rehab team continue to assess barriers to discharge/monitor patient progress toward functional and medical goals  Care Tool:  Bathing        Body parts bathed by helper: Right arm, Left arm, Chest, Abdomen, Front perineal area, Buttocks, Right upper leg, Left upper leg, Right lower leg, Left lower leg, Face     Bathing assist Assist Level: Total Assistance - Patient < 25%     Upper Body Dressing/Undressing Upper body dressing   What is the patient wearing?: Hospital gown only    Upper body assist Assist Level: Dependent - Patient 0%    Lower Body Dressing/Undressing Lower body dressing      What is the patient wearing?: Pants     Lower body assist Assist for lower body dressing: Dependent - Patient 0%     Toileting Toileting Toileting Activity did not occur (Clothing management and hygiene only): (pt is incontinent, not safe to sit on a BSC due to poor trunk control)  Toileting assist Assist for toileting: Moderate Assistance - Patient 50 - 74%     Transfers Chair/bed transfer  Transfers assist     Chair/bed transfer assist level: Dependent - mechanical lift     Locomotion Ambulation   Ambulation assist   Ambulation activity did not occur: Safety/medical concerns  Assist level: 2 helpers Assistive device: Ethelene Hal Max distance: 5'   Walk 10 feet activity   Assist  Walk 10 feet activity did not occur: Safety/medical concerns        Walk 50 feet activity   Assist Walk 50 feet with 2 turns activity did not occur: Safety/medical concerns  Walk 150 feet activity   Assist Walk 150 feet activity did not occur: Safety/medical concerns         Walk 10 feet on uneven surface  activity   Assist Walk 10 feet on uneven surfaces activity did not occur: Safety/medical concerns         Wheelchair     Assist Will patient use wheelchair at discharge?: Yes Type of Wheelchair: (TBD)    Wheelchair  assist level: Dependent - Patient 0% Max wheelchair distance: 150'    Wheelchair 50 feet with 2 turns activity    Assist        Assist Level: Dependent - Patient 0%   Wheelchair 150 feet activity     Assist      Assist Level: Dependent - Patient 0%   Blood pressure (!) 117/55, pulse 78, temperature 97.8 F (36.6 C), temperature source Oral, resp. rate 16, height 5\' 3"  (1.6 m), weight 76.8 kg, SpO2 98 %.  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 Status post ACDF 08/28/2019. Decadron taper. Cervical collar as directed. -PRAFOs to prevent Ankle contractures -Decadron taper completed on 6/5  Continue CIR  2. Antithrombotics: -DVT/anticoagulation:SCDs. Lovenox  -antiplatelet therapy: N/A 3. Pain Management:Baclofen 5 mg 3 times daily  oxycodone as needed  added lidoderm patch to R shoulder 9am-9pm  Gabapentin 100 twice daily started on 6/5 for neuropathic pain, improving, continue to titrate as necessary  4. Mood:Provide emotional support -antipsychotic agents: N/A 5. Neuropsych: This patientiscapable of making decisions on herown behalf. 6. Skin/Wound Care:Routine skin checksand turn while in bed every 2 hours while awake- keep on sides at night. 7. Fluids/Electrolytes/Nutrition:Routine in and outs 8. Neurogenic bowel and bladder.    [place] program. Provide education.  Foley DC'd, increased Flomax  Continue I/O caths 9. GERD. Protonix 10. Hx of prediabetes confounded by steroid-induced hyperglycemia-   Monitor off steroids 11. Hx of gout-controlled 12. Leukocytosis with UTI:  Bactrim changed to Rocephin, completed course 13. Hyperkalemia  Potassium 5.1 on 6/4, labs ordered for tomorrow  LOS: 17 days A FACE TO FACE EVALUATION WAS PERFORMED  Eri Mcevers Lorie Phenix 09/17/2019, 8:40 AM

## 2019-09-17 NOTE — Progress Notes (Signed)
Occupational Therapy Session Note  Patient Details  Name: Meredith Roach MRN: [id number] Date of Birth: [birth year]  Today's Date: 09/17/2019 OT Individual Time: 1100-1200 OT Individual Time Calculation (min): 60 min    Short Term Goals: Week 2:  OT Short Term Goal 1 (Week 2): Pt will be able to actively use hands to pull sleeves over arms. OT Short Term Goal 2 (Week 2): Pt will perform sit<>stand with RW with mod A in preparation for pulling pants over hips OT Short Term Goal 3 (Week 2): Pt will use her LUE/hand with mod A particiipating in bathng tasks OT Short Term Goal 4 (Week 2): Pt will perform 1/3 toileting tasks with max A  Skilled Therapeutic Interventions/Progress Updates:    Pt resting in w/c upon arrival with husband present. OT intervention with focus on LUE NMES (see below) and RUE finger flexion/extension for grasping cups and pegs. Pt's L wrist/finger extensors not as responsive, even with increased intensity, to NMES this morning.  Pt commented that her L hand was much more tingly and numb this morning, in addition to feeling "stiff." PROM performed on L hand. Pt able to stack poker chips and remove them to place on table with RUE. Pt also able to grasp small drinking cup with R hand and bring to mouth before placing back on table and releasing cup. Pt engaged in additional table tasks with focus on R finger flexion/extension in addition to wrist flexion/extension. Pt returned to room and remained in w/c with all needs within reach, seat belt secure, and husband present.   1:1 NMES applied to LUE wrist and finger extensors to faciltate functional release of cup after bringing to mouth Ratio 1:3 Rate 35 pps Waveform- Asymmetric Ramp 1.0 Pulse 300 Intensity- 17 Duration -  10   Report of pain at the beginning of session 0/10 Report of pain at the end of session 0/10  No adverse reactions after treatment and is skin intact.  Therapy Documentation Precautions:   Precautions Precautions: Fall, Cervical Precaution Booklet Issued: No Precaution Comments: reviewed cervical precautions Required Braces or Orthoses: Cervical Brace Cervical Brace: Hard collar Restrictions Weight Bearing Restrictions: No   Pain:  Pt c/o L hand "stiffness"; PROM, repositioned   Therapy/Group: Individual Therapy  Leroy Libman 09/17/2019, 12:21 PM

## 2019-09-17 NOTE — Progress Notes (Signed)
Occupational Therapy Session Note  Patient Details  Name: CHERICA HEIDEN MRN: [id number] Date of Birth: [birth year]  Today's Date: 09/17/2019 OT Individual Time: 0915-1000 OT Individual Time Calculation (min): 45 min    Short Term Goals: Week 2:  OT Short Term Goal 1 (Week 2): Pt will be able to actively use hands to pull sleeves over arms. OT Short Term Goal 2 (Week 2): Pt will perform sit<>stand with RW with mod A in preparation for pulling pants over hips OT Short Term Goal 3 (Week 2): Pt will use her LUE/hand with mod A particiipating in bathng tasks OT Short Term Goal 4 (Week 2): Pt will perform 1/3 toileting tasks with max A  Skilled Therapeutic Interventions/Progress Updates:    Pt resting in bed upon arrival and agreeable to getting OOB. Husband present. OT intervention with focus on bed mobility, sitting balance, LB dressing with sit<>stand in [place], and RUE functional use to increase independence with BADLs.  Pt commented that her hands were more tingly then usual this morning.  Pt also commented that her L hand felt stiff. Supine>sidelying with mod A. Sidelying>sitting EOB with max A.  Sitting balance EOB with min A this morning. Pt required tot A for threading pants. Max A for sit>stand in [place] and dependent for pulling pants over hips.  Pt engaged in RUE tasks grasping clothes pins and placing on dowel. R hand fatigued quickly this morning. L hand with trace finger flexion this morning. Pt returned to room and remained in TIS w/c with all needs within reach, seat belt secure, and husband present.   Therapy Documentation Precautions:  Precautions Precautions: Fall, Cervical Precaution Booklet Issued: No Precaution Comments: reviewed cervical precautions Required Braces or Orthoses: Cervical Brace Cervical Brace: Hard collar Restrictions Weight Bearing Restrictions: No  Pain:  Pt c/o BUE/hands numb and tngling this morning; repositioned, emotional support   Therapy/Group:  Individual Therapy  Leroy Libman 09/17/2019, 12:19 PM

## 2019-09-18 ENCOUNTER — Inpatient Hospital Stay (HOSPITAL_COMMUNITY): Payer: Medicare PPO

## 2019-09-18 ENCOUNTER — Inpatient Hospital Stay (HOSPITAL_COMMUNITY): Payer: Medicare PPO | Admitting: Physical Therapy

## 2019-09-18 LAB — CBC WITH DIFFERENTIAL/PLATELET
Abs Immature Granulocytes: 0.06 10*3/uL (ref 0.00–0.07)
Basophils Absolute: 0 10*3/uL (ref 0.0–0.1)
Basophils Relative: 0 %
Eosinophils Absolute: 0.2 10*3/uL (ref 0.0–0.5)
Eosinophils Relative: 3 %
HCT: 40.7 % (ref 36.0–46.0)
Hemoglobin: 12.6 g/dL (ref 12.0–15.0)
Immature Granulocytes: 1 %
Lymphocytes Relative: 26 %
Lymphs Abs: 1.7 10*3/uL (ref 0.7–4.0)
MCH: 28.3 pg (ref 26.0–34.0)
MCHC: 31 g/dL (ref 30.0–36.0)
MCV: 91.5 fL (ref 80.0–100.0)
Monocytes Absolute: 0.5 10*3/uL (ref 0.1–1.0)
Monocytes Relative: 8 %
Neutro Abs: 4 10*3/uL (ref 1.7–7.7)
Neutrophils Relative %: 62 %
Platelets: 139 10*3/uL — ABNORMAL LOW (ref 150–400)
RBC: 4.45 MIL/uL (ref 3.87–5.11)
RDW: 13.9 % (ref 11.5–15.5)
WBC: 6.4 10*3/uL (ref 4.0–10.5)
nRBC: 0 % (ref 0.0–0.2)

## 2019-09-18 LAB — BASIC METABOLIC PANEL
Anion gap: 11 (ref 5–15)
BUN: 23 mg/dL (ref 8–23)
CO2: 27 mmol/L (ref 22–32)
Calcium: 8.7 mg/dL — ABNORMAL LOW (ref 8.9–10.3)
Chloride: 104 mmol/L (ref 98–111)
Creatinine, Ser: 0.85 mg/dL (ref 0.44–1.00)
GFR calc Af Amer: >60 mL/min (ref >60)
GFR calc non Af Amer: >60 mL/min (ref >60)
Glucose, Bld: 100 mg/dL — ABNORMAL HIGH (ref 70–99)
Potassium: 5.6 mmol/L — ABNORMAL HIGH (ref 3.5–5.1)
Sodium: 142 mmol/L (ref 135–145)

## 2019-09-18 MED ORDER — SODIUM ZIRCONIUM CYCLOSILICATE 10 G PO PACK
10.0000 g | PACK | Freq: Once | ORAL | Status: AC
  Administered 2019-09-18: 10 g via ORAL
  Filled 2019-09-18: qty 1

## 2019-09-18 NOTE — Progress Notes (Signed)
[facility] PHYSICAL MEDICINE & REHABILITATION PROGRESS NOTE   Subjective/Complaints:  Pt reports no pain to me- but reported burning pain to Dr Posey Pronto over weekend- notes R shoulder is good with lidoderm patch- upset that LUE isn't working as well as she wants/needs it to.   OT notes LUE is actually slightly less than last week to no change.  Lags behind RUE   ROS:  Pt denies SOB, abd pain, CP, N/V/C/D, and vision changes  Objective:   No results found. Recent Labs    09/18/19 0654  WBC 6.4  HGB 12.6  HCT 40.7  PLT 139*   Recent Labs    09/15/19 1225 09/18/19 0654  NA 138 142  K 5.1 5.6*  CL 101 104  CO2 28 27  GLUCOSE 117* 100*  BUN 24* 23  CREATININE 0.83 0.85  CALCIUM 8.6* 8.7*    Intake/Output Summary (Last 24 hours) at 09/18/2019 0941 Last data filed at 09/18/2019 0030 Gross per 24 hour  Intake 720 ml  Output 1500 ml  Net -780 ml     Physical Exam: Vital Signs Blood pressure (!) 121/47, pulse 72, temperature 97.7 F (36.5 C), temperature source Oral, resp. rate 18, height 5\' 3"  (1.6 m), weight 76.8 kg, SpO2 98 %. Constitutional: No distress . Vital signs reviewed. HENT: Normocephalic.  Atraumatic.sitting up in high back manual w/c, OT in room NAD Neck: C-collar. Eyes:  Conjugate gaze Cardiovascular: RRR Respiratory: CTA B/L- no W/R/R- good air movement GI: Soft, NT, ND, (+)BS  Skin: Warm and dry.  Intact. Psych: Normal mood.  Normal behavior. Musc: No edema in extremities.  No tenderness in extremities. Neuro: Alert Motor RUE: Shoulder abduction, elbow flexion/extension 3/5, handgrip 3/5 Left upper extremity: Shoulder abduction, elbow flexion/extension 2/5, handgrip 2-/5, finger abd 1/5 Right lower extremity: Hip flexion, knee extension 3/5, ankle dorsiflexion 3+/5 Left lower extremity: Hip flexion, knee extension 2/5, ankle dorsiflexion 2/5  Assessment/Plan: 1. Functional deficits secondary to C3 ASIA C quadriplegia which require 3+ hours per day  of interdisciplinary therapy in a comprehensive inpatient rehab setting.  Physiatrist is providing close team supervision and 24 hour management of active medical problems listed below.  Physiatrist and rehab team continue to assess barriers to discharge/monitor patient progress toward functional and medical goals  Care Tool:  Bathing        Body parts bathed by helper: Right arm, Left arm, Chest, Abdomen, Front perineal area, Buttocks, Right upper leg, Left upper leg, Right lower leg, Left lower leg, Face     Bathing assist Assist Level: Total Assistance - Patient < 25%     Upper Body Dressing/Undressing Upper body dressing   What is the patient wearing?: Pull over shirt    Upper body assist Assist Level: Maximal Assistance - Patient 25 - 49%    Lower Body Dressing/Undressing Lower body dressing      What is the patient wearing?: Pants     Lower body assist Assist for lower body dressing: Total Assistance - Patient < 25%     Toileting Toileting Toileting Activity did not occur (Clothing management and hygiene only): (pt is incontinent, not safe to sit on a BSC due to poor trunk control)  Toileting assist Assist for toileting: Moderate Assistance - Patient 50 - 74%     Transfers Chair/bed transfer  Transfers assist     Chair/bed transfer assist level: Dependent - mechanical lift     Locomotion Ambulation   Ambulation assist   Ambulation activity did not  occur: Safety/medical concerns  Assist level: 2 helpers Assistive device: Ethelene Hal Max distance: 5'   Walk 10 feet activity   Assist  Walk 10 feet activity did not occur: Safety/medical concerns        Walk 50 feet activity   Assist Walk 50 feet with 2 turns activity did not occur: Safety/medical concerns         Walk 150 feet activity   Assist Walk 150 feet activity did not occur: Safety/medical concerns         Walk 10 feet on uneven surface  activity   Assist Walk 10 feet  on uneven surfaces activity did not occur: Safety/medical concerns         Wheelchair     Assist Will patient use wheelchair at discharge?: Yes Type of Wheelchair: (TBD)    Wheelchair assist level: Dependent - Patient 0% Max wheelchair distance: 150'    Wheelchair 50 feet with 2 turns activity    Assist        Assist Level: Dependent - Patient 0%   Wheelchair 150 feet activity     Assist      Assist Level: Dependent - Patient 0%   Blood pressure (!) 121/47, pulse 72, temperature 97.7 F (36.5 C), temperature source Oral, resp. rate 18, height 5\' 3"  (1.6 m), weight 76.8 kg, SpO2 98 %.  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 Status post ACDF 08/28/2019. Decadron taper. Cervical collar as directed. -PRAFOs to prevent Ankle contractures -Decadron taper completed on 6/5  Continue CIR  2. Antithrombotics: -DVT/anticoagulation:SCDs. Lovenox  -antiplatelet therapy: N/A 3. Pain Management:Baclofen 5 mg 3 times daily  oxycodone as needed  added lidoderm patch to R shoulder 9am-9pm  Gabapentin 100 twice daily started on 6/5 for neuropathic pain, improving, continue to titrate as necessary  4. Mood:Provide emotional support -antipsychotic agents: N/A 5. Neuropsych: This patientiscapable of making decisions on herown behalf. 6. Skin/Wound Care:Routine skin checksand turn while in bed every 2 hours while awake- keep on sides at night. 7. Fluids/Electrolytes/Nutrition:Routine in and outs 8. Neurogenic bowel and bladder.    [place] program. Provide education.  Foley DC'd, increased Flomax  Continue I/O caths  6/7- doesn't want to place foley until d/c /gets results/bladder back 9. GERD. Protonix 10. Hx of prediabetes confounded by steroid-induced hyperglycemia-   Monitor off  steroids 11. Hx of gout-controlled 12. Leukocytosis with UTI:  Bactrim changed to Rocephin, completed course 13. Hyperkalemia  Potassium 5.1 on 6/4, labs ordered for tomorrow  6/7- K+ 5.6- give 10G Lokelma x1 and recheck labs  LOS: 18 days A FACE TO FACE EVALUATION WAS PERFORMED  Arael Piccione 09/18/2019, 9:41 AM

## 2019-09-18 NOTE — Progress Notes (Signed)
Occupational Therapy Session Note  Patient Details  Name: Meredith Roach MRN: [id number] Date of Birth: 01-06-1941  Today's Date: 09/18/2019 OT Individual Time: 4098-1191 OT Individual Time Calculation (min): 75 min    Short Term Goals: Week 3:  OT Short Term Goal 1 (Week 3): Pt will perform 1/3 toileting tasks with max A OT Short Term Goal 2 (Week 3): Pt will perform UB dressing with max A OT Short Term Goal 3 (Week 3): Pt will perform toilet transfer with max A using LRAD OT Short Term Goal 4 (Week 3): Pt will maintain standing balance with mod A during functional activity  Skilled Therapeutic Interventions/Progress Updates:    Pt resting in bed upon arrival and ready to get OOB and eat breakfast.  OT intervention with focus on bed mobility, sitting balance, UB dressing, self feeding, grooming, sit<>stand, standing balance, and RUE function to increase independence with BADLs. Mod A for rolling R/L to facilitate donning pants at bed level. Max A for supine>sit EOB. Min A for sitting balance. Mod A for sit>stand with [place] from EOB to transfer to w/c.  Pt required max A+2 for sit<>stand from paddles on [place] to facilitate sitting in w/c. Self feeding with supervision using adaptive utensil (red foam). Pt able to wash face and brush teeth after setup. Pt transitioned to gym and engaged in RUE/hand task with clothes pins. Pt able to use pincer grip to grasp yellow and red clothes pins and place on dowel. Pt attempted green clothes pins but unable to apply adequate force to open. Focus on increased strength and grasp to assist with BADLs and increase independence.  Pt returned to room and remained in w/c with all needs within reach. Seat belt secure.   Therapy Documentation Precautions:  Precautions Precautions: Fall, Cervical Precaution Booklet Issued: No Precaution Comments: reviewed cervical precautions Required Braces or Orthoses: Cervical Brace Cervical Brace: Hard  collar Restrictions Weight Bearing Restrictions: No   Pain:  Pt denies pain this morning, stating her L shoulder is better   Therapy/Group: Individual Therapy  Leroy Libman 09/18/2019, 8:19 AM

## 2019-09-18 NOTE — Progress Notes (Signed)
Physical Therapy Session Note  Patient Details  Name: Meredith Roach MRN: [id number] Date of Birth: 22-Dec-1940  Today's Date: 09/18/2019 PT Individual Time: 0930-1030 PT Individual Time Calculation (min): 60 min   Short Term Goals: Week 1:  PT Short Term Goal 1 (Week 1): Pt will perform bed mobility with assist x 1 PT Short Term Goal 1 - Progress (Week 1): Met PT Short Term Goal 2 (Week 1): Pt will perform least restrictive transfer with assist x 1 PT Short Term Goal 2 - Progress (Week 1): Met PT Short Term Goal 3 (Week 1): Pt will initiate gait training as safe and able PT Short Term Goal 3 - Progress (Week 1): Progressing toward goal Week 2:  PT Short Term Goal 1 (Week 2): Pt will perform least restrictive transfer with max A consistently PT Short Term Goal 1 - Progress (Week 2): Progressing toward goal PT Short Term Goal 2 (Week 2): Pt will initiate gait training PT Short Term Goal 2 - Progress (Week 2): Met PT Short Term Goal 3 (Week 2): Pt will maintain static sitting balance with close SBA x 5 min PT Short Term Goal 3 - Progress (Week 2): Progressing toward goal Week 3:  PT Short Term Goal 1 (Week 3): Pt will complete least restrictive transfer with max A consistently PT Short Term Goal 2 (Week 3): Pt will ambulate x 10 ft with LRAD PT Short Term Goal 3 (Week 3): Pt will initiate w/c mobility  Skilled Therapeutic Interventions/Progress Updates:    PAIN  R shoulder 5/10 in standing w/wbing on RUE, repositioned for comfort, states 0/5 at rest, treatment to tolerance. Pt initially oob in wc and agreeable to treatment. Transported to gym for session. wc to mat total assist SBT . Worked on sitting balance - static sit w/cga, loses balance easily w/AP wt shifts. Scooting in sitting total assist to scoot.Hannah Beat harness donned by therapist, cga for balance. STS from elevated mat w/mod to min assist for transition and max verbal and tactile cues for glut/quad activation, trunk  extension, requires frequent cues do to difficulty maintaining.  Pt stood approx 2 min, 1 min, 1 min w/addition of mirror for visual feedback First trial of standing pt w/sudden giving way of knees w/LOB prevented by [employer].  Second person assist to return to standing and then lowered to comfortable sitting for rest break. Requires several min rest between efforts, activity very challenging for patient.  Better knee/hip control w/repeated efforts and addition of mirror for feedback.  STS controlled by patient/cues/mod assist of therapist. Able to perform one rep of partial squat w/return to standing.  Pt returned to sitting as above and harness removed by therapist.  Mat to wc SBT total assist of 2 due to fatigue.  Pt transported back to room and positioned comfortably, needs in reach, next therapist arrived for continued rx.   Therapy Documentation Precautions:  Precautions Precautions: Fall, Cervical Precaution Booklet Issued: No Precaution Comments: reviewed cervical precautions Required Braces or Orthoses: Cervical Brace Cervical Brace: Hard collar Restrictions Weight Bearing Restrictions: No    Therapy/Group: Individual Therapy  Callie Fielding, [place] 09/18/2019, 10:42 AM

## 2019-09-18 NOTE — Progress Notes (Signed)
Occupational Therapy Weekly Progress Note  Patient Details  Name: Meredith Roach MRN: [id number] Date of Birth: Mar 21, 1941  Beginning of progress report period: Sep 11, 2019 End of progress report period: September 18, 2019  Patient has met 3 of 4 short term goals.  Pt making slow progress with BADLs and functional transfers. Pt is currently max A/tot A for bathing/dressing tasks.  Self feeding with min A using adaptive utensil. Pt able to loosely grasp wash cloth with RUE but unable to maintain grasp adequately to perform bathing tasks without max/tot A. Transfers with Stedy vs max A SB. Sit<>stand with RW mod A from elevated surface. Pt with weak RUE grasp and able to grasp large objects.  LUE with trace finger flexion/extension. Pt is tot A/dependent for all toileting tasks.  Patient continues to demonstrate the following deficits: muscle weakness and muscle paralysis, abnormal tone, unbalanced muscle activation and decreased coordination and decreased sitting balance, decreased standing balance, decreased postural control and decreased balance strategies and therefore will continue to benefit from skilled OT intervention to enhance overall performance with BADL and Reduce care partner burden.  Patient progressing toward long term goals..  Continue plan of care.  OT Short Term Goals Week 2:  OT Short Term Goal 1 (Week 2): Pt will be able to actively use hands to pull sleeves over arms. OT Short Term Goal 1 - Progress (Week 2): Met OT Short Term Goal 2 (Week 2): Pt will perform sit<>stand with RW with mod A in preparation for pulling pants over hips OT Short Term Goal 2 - Progress (Week 2): Met OT Short Term Goal 3 (Week 2): Pt will use her LUE/hand with mod A particiipating in bathng tasks OT Short Term Goal 3 - Progress (Week 2): Met OT Short Term Goal 4 (Week 2): Pt will perform 1/3 toileting tasks with max A OT Short Term Goal 4 - Progress (Week 2): Progressing toward goal Week 3:  OT Short  Term Goal 1 (Week 3): Pt will perform 1/3 toileting tasks with max A OT Short Term Goal 2 (Week 3): Pt will perform UB dressing with max A OT Short Term Goal 3 (Week 3): Pt will perform toilet transfer with max A using LRAD OT Short Term Goal 4 (Week 3): Pt will maintain standing balance with mod A during functional activity   Leroy Libman 09/18/2019, 6:36 AM

## 2019-09-18 NOTE — Plan of Care (Signed)
  Problem: Consults Goal: RH GENERAL PATIENT EDUCATION Description: See Patient Education module for education specifics. Outcome: Progressing Goal: Skin Care Protocol Initiated - if Braden Score 18 or less Description: N/A Outcome: Progressing   Problem: RH BOWEL ELIMINATION Goal: RH STG MANAGE BOWEL WITH ASSISTANCE Description: STG Manage Bowel with max Assistance. Outcome: Progressing   Problem: RH BLADDER ELIMINATION Goal: RH STG MANAGE BLADDER WITH EQUIPMENT WITH ASSISTANCE Description: STG Manage Bladder With Equipment With Max Assistance Outcome: Progressing   Problem: RH SKIN INTEGRITY Goal: RH STG SKIN FREE OF INFECTION/BREAKDOWN Description: Remain free of breakdown during admission with max assist. Outcome: Progressing Goal: RH STG MAINTAIN SKIN INTEGRITY WITH ASSISTANCE Description: STG Maintain Skin Integrity With Max Assistance. Outcome: Progressing Goal: RH STG ABLE TO PERFORM INCISION/WOUND CARE W/ASSISTANCE Description: STG Able To Perform Incision/Wound Care With total Assistance from caregiver. Outcome: Progressing   Problem: RH SAFETY Goal: RH STG ADHERE TO SAFETY PRECAUTIONS W/ASSISTANCE/DEVICE Description: STG Adhere to Safety Precautions With Mod  Assistance Outcome: Progressing   Problem: RH PAIN MANAGEMENT Goal: RH STG PAIN MANAGED AT OR BELOW PT'S PAIN GOAL Description: Pain goal of 3 on pain scale of 0-10 Outcome: Progressing

## 2019-09-18 NOTE — Progress Notes (Signed)
Patient on bowl program. Patient had bowl movement, bowl program successful.

## 2019-09-18 NOTE — Progress Notes (Signed)
Physical Therapy Session Note  Patient Details  Name: Meredith Roach MRN: [id number] Date of Birth: 04-21-1940  Today's Date: 09/18/2019 PT Individual Time: 1030-1100 PT Individual Time Calculation (min): 30 min   Short Term Goals: Week 3:  PT Short Term Goal 1 (Week 3): Pt will complete least restrictive transfer with max A consistently PT Short Term Goal 2 (Week 3): Pt will ambulate x 10 ft with LRAD PT Short Term Goal 3 (Week 3): Pt will initiate w/c mobility  Skilled Therapeutic Interventions/Progress Updates:    Pt received seated in w/c in room handed off from previous PT session. Agreeable to PT session. No complaints of pain. Dependent transport via TIS w/c to/from therapy gym. Slide board transfer w/c to/from mat table with max to total A. Pt minimally able to assist with transfer due to fatigue and weakness. Session focus on sitting balance and core control while seated EOB. Seated mini-crunches 2 x 5 reps with mod A to return to full upright sitting even with use of RUE on mat table to assist with anterior lean. Seated lateral leans x 5 reps L and R with mod A to return to upright from R side and max A from the L. Pt left semi-reclined in TIS w/c in room with needs in reach at end of session.  Therapy Documentation Precautions:  Precautions Precautions: Fall, Cervical Precaution Booklet Issued: No Precaution Comments: reviewed cervical precautions Required Braces or Orthoses: Cervical Brace Cervical Brace: Hard collar Restrictions Weight Bearing Restrictions: No    Therapy/Group: Individual Therapy   Excell Seltzer, PT, DPT  09/18/2019, 12:32 PM

## 2019-09-18 NOTE — Progress Notes (Signed)
Occupational Therapy Session Note  Patient Details  Name: Meredith Roach MRN: [id number] Date of Birth: 1940/12/24  Today's Date: 09/18/2019 OT Individual Time: 1300-1345 OT Individual Time Calculation (min): 45 min    Short Term Goals: Week 3:  OT Short Term Goal 1 (Week 3): Pt will perform 1/3 toileting tasks with max A OT Short Term Goal 2 (Week 3): Pt will perform UB dressing with max A OT Short Term Goal 3 (Week 3): Pt will perform toilet transfer with max A using LRAD OT Short Term Goal 4 (Week 3): Pt will maintain standing balance with mod A during functional activity  Skilled Therapeutic Interventions/Progress Updates:    OT intervention with focus on sitting balance and RUE functional use to increase independence with BADLs. Pt resting in TIS w/c upon arrival. Pt engaged in seated task using RUE to retrieve cards from card board and replace. Pt required to lean forward to reach cards and retrieve and replace. Pt also engaged in peg board task removing pegs from container and placing on board in designated pattern. Pt required assistance retrieving from container but able to place on board and remove before returning to container.  Pt returned to room and remained in w/c with all needs within reach. Seat belt secure.   Therapy Documentation Precautions:  Precautions Precautions: Fall, Cervical Precaution Booklet Issued: No Precaution Comments: reviewed cervical precautions Required Braces or Orthoses: Cervical Brace Cervical Brace: Hard collar Restrictions Weight Bearing Restrictions: No Pain:  Pt denies pain this afternoon   Therapy/Group: Individual Therapy  Leroy Libman 09/18/2019, 2:48 PM

## 2019-09-19 ENCOUNTER — Inpatient Hospital Stay (HOSPITAL_COMMUNITY): Payer: Medicare PPO | Admitting: Physical Therapy

## 2019-09-19 ENCOUNTER — Inpatient Hospital Stay (HOSPITAL_COMMUNITY): Payer: Medicare PPO

## 2019-09-19 ENCOUNTER — Inpatient Hospital Stay (HOSPITAL_COMMUNITY): Payer: Medicare PPO | Admitting: Occupational Therapy

## 2019-09-19 LAB — BASIC METABOLIC PANEL
Anion gap: 11 (ref 5–15)
BUN: 21 mg/dL (ref 8–23)
CO2: 23 mmol/L (ref 22–32)
Calcium: 8.3 mg/dL — ABNORMAL LOW (ref 8.9–10.3)
Chloride: 104 mmol/L (ref 98–111)
Creatinine, Ser: 0.65 mg/dL (ref 0.44–1.00)
GFR calc Af Amer: >60 mL/min (ref >60)
GFR calc non Af Amer: >60 mL/min (ref >60)
Glucose, Bld: 104 mg/dL — ABNORMAL HIGH (ref 70–99)
Potassium: 4.9 mmol/L (ref 3.5–5.1)
Sodium: 138 mmol/L (ref 135–145)

## 2019-09-19 MED ORDER — DULOXETINE HCL 30 MG PO CPEP
30.0000 mg | ORAL_CAPSULE | Freq: Every day | ORAL | Status: DC
  Administered 2019-09-19 – 2019-09-28 (×10): 30 mg via ORAL
  Filled 2019-09-19 (×10): qty 1

## 2019-09-19 NOTE — Progress Notes (Signed)
Occupational Therapy Session Note  Patient Details  Name: Meredith Roach MRN: [id number] Date of Birth: 22-Sep-1940  Today's Date: 09/19/2019 OT Individual Time: 1130-1155 OT Individual Time Calculation (min): 25 min    Short Term Goals: Week 3:  OT Short Term Goal 1 (Week 3): Pt will perform 1/3 toileting tasks with max A OT Short Term Goal 2 (Week 3): Pt will perform UB dressing with max A OT Short Term Goal 3 (Week 3): Pt will perform toilet transfer with max A using LRAD OT Short Term Goal 4 (Week 3): Pt will maintain standing balance with mod A during functional activity  Skilled Therapeutic Interventions/Progress Updates:    OT intervention with focus on BUE gross motor and [facility] tasks with large blocks and theraputty.  Pt using LUE as gross assist when performing BUE tasks. Pt with weak grasp but functional when managing/manipulating large objects. Pt's hands fatigue quickly and pt requires rest breaks. Pt remained seated in w/c with all needs within reach and seat belt secure.   Therapy Documentation Precautions:  Precautions Precautions: Fall, Cervical Precaution Booklet Issued: No Precaution Comments: reviewed cervical precautions Required Braces or Orthoses: Cervical Brace Cervical Brace: Hard collar Restrictions Weight Bearing Restrictions: No  Pain:  PT denies pain this morning   Therapy/Group: Individual Therapy  Leroy Libman 09/19/2019, 12:04 PM

## 2019-09-19 NOTE — Progress Notes (Signed)
Occupational Therapy Session Note  Patient Details  Name: LAKIN RHINE MRN: [id number] Date of Birth: 15-Sep-1940  Today's Date: 09/19/2019 OT Individual Time: 0700-0758 OT Individual Time Calculation (min): 58 min    Short Term Goals: Week 3:  OT Short Term Goal 1 (Week 3): Pt will perform 1/3 toileting tasks with max A OT Short Term Goal 2 (Week 3): Pt will perform UB dressing with max A OT Short Term Goal 3 (Week 3): Pt will perform toilet transfer with max A using LRAD OT Short Term Goal 4 (Week 3): Pt will maintain standing balance with mod A during functional activity  Skilled Therapeutic Interventions/Progress Updates:    Pt resting in bed upon arrival. OT intervention with focus on bed mobility, LB dressing, sitting balance, sit<>stand with Stedy, self feeding, grooming, and activity tolerance to increase independence with BADLs.  Pt tot A for LB dressing at bed level. Mod A rolling to R and Min A rolling to L using bed rails.  Supine>sit EOB with max A.  Sitting balance with min A.  Mod A for sit>stand from EOB with Stedy.  Mod A for sit>stand from [place]. Pt required CGA for self feeding this morning.  Washing face and brushing teeth with setup assistance. Pt remained seated in w/c with all needs within reach and seat belt activated.   Therapy Documentation Precautions:  Precautions Precautions: Fall, Cervical Precaution Booklet Issued: No Precaution Comments: reviewed cervical precautions Required Braces or Orthoses: Cervical Brace Cervical Brace: Hard collar Restrictions Weight Bearing Restrictions: No  Pain:  Pt denies pain this morning   Therapy/Group: Individual Therapy  Leroy Libman 09/19/2019, 8:00 AM

## 2019-09-19 NOTE — Patient Care Conference (Signed)
Inpatient RehabilitationTeam Conference and Plan of Care Update Date: 09/19/2019   Time: 4:31 PM    Patient Name: Meredith Roach      Medical Record Number: [id number]  Date of Birth: 01-18-1941 Sex: Female         Room/Bed: 4M11C/4M11C-01 Payor Info: Payor: [employer] MEDICARE / Plan: [employer] MEDICARE CHOICE PPO / Product Type: *No Product type* /    Admit Date/Time:  08/31/2019  5:23 PM  Primary Diagnosis:  Quadriplegia [facility])  Patient Active Problem List   Diagnosis Date Noted   Hyperkalemia    Steroid-induced hyperglycemia    Prediabetes    Neurogenic bowel    Neurogenic bladder    Neuropathic pain    Quadriplegia ([place]) 08/31/2019   S/P cervical spinal fusion 08/28/2019   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 ([place]) 08/26/2019   Radiculopathy 10/21/2016    Expected Discharge Date: Expected Discharge Date: 09/29/19  Team Members Present: Physician leading conference: Dr. [name] Care Coodinator Present: Loralee Pacas, LCSWA;Other (comment)(Itza Maniaci Creig Hines, RN, BSN, [place]) Nurse Present: Other (comment)(Taryn Laurance Flatten, RN) PT Present: Excell Seltzer, PT OT Present: Roanna Epley, COTA PPS Coordinator present : Gunnar Fusi, SLP     Current Status/Progress Goal Weekly Team Focus  Bowel/Bladder   Patient requires Q6H bladder scan with straight caths with resultant large volume output.  Bowel program with LBM 09/19/19  Patient will return to normal bowel and bladder patterns and continence  Assess B/B Qshift and PRN.  Continue bowel and bladder program/interventions   Swallow/Nutrition/ Hydration             ADL's   bathing-dressing-max A/tot A at bed level and seated in w/c; functional transfers-Stedy and max A SB; sit<>stand with Stedy-mod/max A  bathing-mod A; UB dressing-mod A; LB dressing-max A; self feeding-supervision/setup; toilet tranfsers-mod A; toileting-max A  NMR, functional transfers, sitting balance, education, ADL retraining,  activity tolerance   Mobility   mod to max A bed mobility, can require max to +2 to stand at times, transfer via stedy vs max A/total A with SB  mod A overall at w/c level, will likely have to downgrade  transfers, standing in [employer], gait training with [place] walker, LE NMR, sitting balance   Communication             Safety/Cognition/ Behavioral Observations            Pain   Patient is mainly pain free.  Occasional pain managed with PRN medications  Patient will continue to have minimal pain with level less than 3  Assess pain Qshift and PRN.  Non-pharmacological interventions in addition to PRN meds.   Skin   Patient has surgical to right side of neck with steri-strips intact.  Skin otherwise intact.  Patient will remain free of skin breakdown and infection  Assess skin Qshift and PRN. Skin care with barrier creams, nutrition and offloading are primary interventions/focus    Rehab Goals Patient on target to meet rehab goals: Yes *See Care Plan and progress notes for long and short-term goals.     Barriers to Discharge  Current Status/Progress Possible Resolutions Date Resolved   Nursing                  PT  Decreased caregiver support;Medical stability;Home environment access/layout;Incontinence;Neurogenic Bowel & Bladder  unsure if family can provide the 24/7 physical assist she will need upon d/c home; will need ramped entrance  OT                  SLP                Care Coordinator                Discharge Planning/Teaching Needs:  D/c to home with her husband who will provide 24/7 care. Pt reports their daughters [name] and [name]) will rotate their time. Dtr [name] intends to stay once school has ended.  Family education as recommended   Team Discussion:  MD requesting nursing bowel program documentation be completed in chart notes each evening. Pt is slow to progress towards goals and is limited by stiffness in hands. Pt is able to feed herself most times  with adaptive equipment. Pt has decreased caregiver support and limited family support.  Revisions to Treatment Plan:  MD starting Duloxetine 30 mg at HS.    Medical Summary Current Status: doing OK- in/out caths- and bowel program nightly Weekly Focus/Goal: PT_ very slow progress- max A for bed; transfers SB- max-A to D; standing/gait- 5 ft with eva walker with 3 skilled therapists; manual w/c vs power w/c  Barriers to Discharge: Decreased family/caregiver support;Home enviroment access/layout;Medical stability;Weight bearing restrictions;Neurogenic Bowel & Bladder;Incontinence  Barriers to Discharge Comments: elevated K+- working on this- with Lokelma- nervepain/depression- added Duloxetine. - d/c 6/18- might need to push out for fam ed-hoyer lift? Possible Resolutions to Barriers: self feeding with adaptive utensil- UB max A; LB total A   Continued Need for Acute Rehabilitation Level of Care: The patient requires daily medical management by a physician with specialized training in physical medicine and rehabilitation for the following reasons: Direction of a multidisciplinary physical rehabilitation program to maximize functional independence : Yes Medical management of patient stability for increased activity during participation in an intensive rehabilitation regime.: Yes Analysis of laboratory values and/or radiology reports with any subsequent need for medication adjustment and/or medical intervention. : Yes   I attest that I was present, lead the team conference, and concur with the assessment and plan of the team.   Cristi Loron 09/19/2019, 4:31 PM

## 2019-09-19 NOTE — Progress Notes (Signed)
Patient ID: Meredith Roach, female   DOB: [birth year], 79 y.o.   MRN: [id number]   SW met with pt in room to provide updates from team conference, and discussing family education. Pt reports husband is available, and their dtr Meredith Roach will be back next week; dtr [name] gets off work around 4:30pm.   *SW spoke with [name] [phone]) to provide updates from team conference, and family edu. Husband to be here Thurs and Fri 10am-12pm/1pm-3pm.   Loralee Pacas, MSW, [place] Office: [phone] Cell: [phone] Fax: [phone]

## 2019-09-19 NOTE — Progress Notes (Signed)
Physical Therapy Session Note  Patient Details  Name: Meredith Roach MRN: [id number] Date of Birth: [birth year]  Today's Date: 09/19/2019 PT Individual Time: 0915-1025 PT Individual Time Calculation (min): 70 min   Short Term Goals: Week 3:  PT Short Term Goal 1 (Week 3): Pt will complete least restrictive transfer with max A consistently PT Short Term Goal 2 (Week 3): Pt will ambulate x 10 ft with LRAD PT Short Term Goal 3 (Week 3): Pt will initiate w/c mobility  Skilled Therapeutic Interventions/Progress Updates:    Pt received seated in TIS w/c in room, agreeable to PT session. No complaints of pain. Dependent transport via TIS w/c to/from therapy gym. Slide board transfer w/c to/from mat table with max A with max cueing for anterior lean during transfer. Once seated EOM pt reports some stiffness and pain that has started recently but that she is unable to fully describe in her L ankle/heel region. Assisted pt with L heel cord stretch, 3 x 30-60 sec. Pt reports minimal change in sensation following stretching. Set pt up with maxi sky sling. Sit to stand x 5 reps with maxi sky to [place] walker with mod to max A to achieve full upright stance. Use of mirror for visual feedback in standing. Pt requires mod verbal and tactile cueing for hip and trunk extension for upright stance. While in standing pt able to perform mini-squat x 1 rep, unable to perform more repetitions due to LE weakness limiting ability to return to fully extended LE. After seated rest break pt able to perform mini-squats x 10 reps. Standing lateral weight shifts L/R. Attempt to have pt perform heel raises in standing, unable to achieve without knees blocked. Pt exhibits overall improved endurance for standing this date but continues to exhibit significant LE weakness and endurance limiting her ability to make functional progress with standing activities. Seated balance EOM performing L/R ball kicks initially with SBA for trunk control  increasing to mod A with onset of fatigue. Pt left semi-reclined in TIS w/c with needs in reach at end of session.  Therapy Documentation Precautions:  Precautions Precautions: Fall, Cervical Precaution Booklet Issued: No Precaution Comments: reviewed cervical precautions Required Braces or Orthoses: Cervical Brace Cervical Brace: Hard collar Restrictions Weight Bearing Restrictions: No    Therapy/Group: Individual Therapy   Excell Seltzer, PT, DPT  09/19/2019, 12:11 PM

## 2019-09-19 NOTE — Progress Notes (Signed)
Patient received a suppository this evening and was digitally stimulated.  There was no stool felt and there was no stool that was expelled at the time of the stimulation.  Patient showed no obvious signs and symptoms of acute distress or discomfort.

## 2019-09-19 NOTE — Progress Notes (Signed)
[facility] PHYSICAL MEDICINE & REHABILITATION PROGRESS NOTE   Subjective/Complaints:  Pt reports nerve pain in hands- slightly better with Gabapentin- less stiff/tight.    Hanging in there".  K+ 4.9- down from 5.6 with Lokelma.   No bowel program documented was done- even in flowsheet- and then ended up having 4 incontinent BMs overnight.     ROS:   Pt denies SOB, abd pain, CP, N/V/C/D, and vision changes   Objective:   No results found. Recent Labs    09/18/19 0654  WBC 6.4  HGB 12.6  HCT 40.7  PLT 139*   Recent Labs    09/18/19 0654 09/19/19 0609  NA 142 138  K 5.6* 4.9  CL 104 104  CO2 27 23  GLUCOSE 100* 104*  BUN 23 21  CREATININE 0.85 0.65  CALCIUM 8.7* 8.3*    Intake/Output Summary (Last 24 hours) at 09/19/2019 0906 Last data filed at 09/19/2019 0710 Gross per 24 hour  Intake --  Output 1900 ml  Net -1900 ml     Physical Exam: Vital Signs Blood pressure (!) 104/45, pulse 76, temperature 97.9 F (36.6 C), temperature source Oral, resp. rate 18, height 5\' 3"  (1.6 m), weight 76.8 kg, SpO2 99 %. Constitutional: No distress . Vital signs reviewed. Awake, appropriate, a little more depressed affect; sitting up in w/c, NAD HENT: Normocephalic.  Neck: C-collar. Eyes:  Conjugate gaze Cardiovascular: RRR Respiratory: CTA B/L- no W/R/R- good air movement GI: Soft, NT, ND, (+)BS  Skin: Warm and dry.  Intact. Psych: a little depressed affect Musc: No edema in extremities.  No tenderness in extremities. Neuro: Alert Motor RUE: Shoulder abduction, elbow flexion/extension 3/5, handgrip 3/5 Left upper extremity: Shoulder abduction, elbow flexion/extension 2/5, handgrip 2-/5, finger abd 1/5 Right lower extremity: Hip flexion, knee extension 3/5, ankle dorsiflexion 3+/5 Left lower extremity: Hip flexion, knee extension 2/5, ankle dorsiflexion 2/5  Assessment/Plan: 1. Functional deficits secondary to C3 ASIA C quadriplegia which require 3+ hours per day of  interdisciplinary therapy in a comprehensive inpatient rehab setting.  Physiatrist is providing close team supervision and 24 hour management of active medical problems listed below.  Physiatrist and rehab team continue to assess barriers to discharge/monitor patient progress toward functional and medical goals  Care Tool:  Bathing        Body parts bathed by helper: Right arm, Left arm, Chest, Abdomen, Front perineal area, Buttocks, Right upper leg, Left upper leg, Right lower leg, Left lower leg, Face     Bathing assist Assist Level: Total Assistance - Patient < 25%     Upper Body Dressing/Undressing Upper body dressing   What is the patient wearing?: Pull over shirt    Upper body assist Assist Level: Maximal Assistance - Patient 25 - 49%    Lower Body Dressing/Undressing Lower body dressing      What is the patient wearing?: Pants     Lower body assist Assist for lower body dressing: Total Assistance - Patient < 25%     Toileting Toileting Toileting Activity did not occur (Clothing management and hygiene only): (pt is incontinent, not safe to sit on a BSC due to poor trunk control)  Toileting assist Assist for toileting: Moderate Assistance - Patient 50 - 74%     Transfers Chair/bed transfer  Transfers assist     Chair/bed transfer assist level: Total Assistance - Patient < 25%     Locomotion Ambulation   Ambulation assist   Ambulation activity did not occur: Safety/medical concerns  Assist level: 2 helpers Assistive device: Ethelene Hal Max distance: 5'   Walk 10 feet activity   Assist  Walk 10 feet activity did not occur: Safety/medical concerns        Walk 50 feet activity   Assist Walk 50 feet with 2 turns activity did not occur: Safety/medical concerns         Walk 150 feet activity   Assist Walk 150 feet activity did not occur: Safety/medical concerns         Walk 10 feet on uneven surface  activity   Assist Walk 10  feet on uneven surfaces activity did not occur: Safety/medical concerns         Wheelchair     Assist Will patient use wheelchair at discharge?: Yes Type of Wheelchair: (TBD)    Wheelchair assist level: Dependent - Patient 0% Max wheelchair distance: 150'    Wheelchair 50 feet with 2 turns activity    Assist        Assist Level: Dependent - Patient 0%   Wheelchair 150 feet activity     Assist      Assist Level: Dependent - Patient 0%   Blood pressure (!) 104/45, pulse 76, temperature 97.9 F (36.6 C), temperature source Oral, resp. rate 18, height 5\' 3"  (1.6 m), weight 76.8 kg, SpO2 99 %.  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 Status post ACDF 08/28/2019. Decadron taper. Cervical collar as directed. -PRAFOs to prevent Ankle contractures -Decadron taper completed on 6/5  6/8- has slowed recovery -depressing pt more-will start something.   Continue CIR  2. Antithrombotics: -DVT/anticoagulation:SCDs. Lovenox  -antiplatelet therapy: N/A 3. Pain Management:Baclofen 5 mg 3 times daily  oxycodone as needed  added lidoderm patch to R shoulder 9am-9pm  Gabapentin 100 twice daily started on 6/5 for neuropathic pain, improving, continue to titrate as necessary  4. Mood:Provide emotional support  6/8- starting Duloxetine 30 mg QHS- since more depressed AND to help with nerve pain.  -antipsychotic agents: N/A 5. Neuropsych: This patientiscapable of making decisions on herown behalf. 6. Skin/Wound Care:Routine skin checksand turn while in bed every 2 hours while awake- keep on sides at night. 7. Fluids/Electrolytes/Nutrition:Routine in and outs 8. Neurogenic bowel and bladder.    [place] program. Provide education.  Foley DC'd, increased Flomax  Continue I/O caths  6/7- doesn't want to  place foley until d/c /gets results/bladder back/d/c.  9. GERD. Protonix 10. Hx of prediabetes confounded by steroid-induced hyperglycemia-   Monitor off steroids 11. Hx of gout-controlled 12. Leukocytosis with UTI:  Bactrim changed to Rocephin, completed course 13. Hyperkalemia  Potassium 5.1 on 6/4, labs ordered for tomorrow  6/7- K+ 5.6- give 10G Lokelma x1 and recheck labs  6/8- K+ down to 4.9 with 10 G of Lokelma   LOS: 19 days A FACE TO FACE EVALUATION WAS PERFORMED  Jyssica Rief 09/19/2019, 9:06 AM

## 2019-09-19 NOTE — Progress Notes (Signed)
Physical Therapy Session Note  Patient Details  Name: Meredith Roach MRN: [id number] Date of Birth: 27-Apr-1940  Today's Date: 09/19/2019 PT Individual Time: 1415-1500 PT Individual Time Calculation (min): 45 min   Short Term Goals: Week 3:  PT Short Term Goal 1 (Week 3): Pt will complete least restrictive transfer with max A consistently PT Short Term Goal 2 (Week 3): Pt will ambulate x 10 ft with LRAD PT Short Term Goal 3 (Week 3): Pt will initiate w/c mobility  Skilled Therapeutic Interventions/Progress Updates: Pt presented in bed agreeable to therapy. Pt denies pain throughout session. Pt performed supine to sit with overall modA requiring modA for log roll to R with facilitation at scapula and hips and mod to near minA for transition to sitting EOB. Pt was able to scoot to EOB with CGA to better position in preparation for SB transfer. Performed SB transfer maxA x 1 (+1 safety). Pt transported to rehab gym and performed SB transfer to mat in same manner as prior. Pt then participated in ball taps with min to moderate challenges. Pt was able to increase anterior and lateral leans to tap ball with overall CGA with no overt LOB. With fatigue pt noted to have increased posterior lean which required intermittent minA for return to midline. Pt also participated in modified "sit ups" 3 x 5 performing progressively deeper sit up with each round. Pt then participated in lateral leans dropping to elbow then returning to midline x 5 for increased oblique activation requiring minA. Performed SB transfer back to TIS maxA x 1 (+1 for safety) and pt transported back to room. Pt requesting to remain in TIS to await family. Pt left with belt alarm on, call bell within reach and needs met.      Therapy Documentation Precautions:  Precautions Precautions: Fall, Cervical Precaution Booklet Issued: No Precaution Comments: reviewed cervical precautions Required Braces or Orthoses: Cervical Brace Cervical  Brace: Hard collar Restrictions Weight Bearing Restrictions: No General:   Vital Signs: Therapy Vitals Temp: (!) 97.5 F (36.4 C) Pulse Rate: 82 Resp: 16 BP: (!) 102/54 Patient Position (if appropriate): Sitting Oxygen Therapy SpO2: 99 % O2 Device: Room Air Pain: Pain Assessment Pain Scale: 0-10 Pain Score: 0-No pain Faces Pain Scale: No hurt Mobility:   Locomotion :    Trunk/Postural Assessment :    Balance:   Exercises:   Other Treatments:      Therapy/Group: Individual Therapy  Otie Headlee 09/19/2019, 4:04 PM

## 2019-09-20 ENCOUNTER — Inpatient Hospital Stay (HOSPITAL_COMMUNITY): Payer: Medicare PPO | Admitting: Physical Therapy

## 2019-09-20 ENCOUNTER — Inpatient Hospital Stay (HOSPITAL_COMMUNITY): Payer: Medicare PPO

## 2019-09-20 NOTE — Progress Notes (Signed)
Occupational Therapy Session Note  Patient Details  Name: CORIANN BROUHARD MRN: [id number] Date of Birth: [birth year]  Today's Date: 09/20/2019 OT Individual Time: 0700-0810 OT Individual Time Calculation (min): 70 min    Short Term Goals: Week 3:  OT Short Term Goal 1 (Week 3): Pt will perform 1/3 toileting tasks with max A OT Short Term Goal 2 (Week 3): Pt will perform UB dressing with max A OT Short Term Goal 3 (Week 3): Pt will perform toilet transfer with max A using LRAD OT Short Term Goal 4 (Week 3): Pt will maintain standing balance with mod A during functional activity  Skilled Therapeutic Interventions/Progress Updates:    Pt resting in bed upon arrival.  OT intervention with focus on bed mobility, sitting balance, self feeding, grooming, and increased BUE function to assist with ADLS and increase independence with BADLs.  Pt tot A for LB dressing tasks, max A for supine>sit EOB, min A for sitting balance. Transfers with St[place].  Sit<>stand from EOB with mod A.  Sit<>stand from [facility] with max A. Self feeding with setup. Grooming with setup, including brushing teeth. Pt remained seated in w/c with all needs within reach and seat belt secure.   Therapy Documentation Precautions:  Precautions Precautions: Fall, Cervical Precaution Booklet Issued: No Precaution Comments: reviewed cervical precautions Required Braces or Orthoses: Cervical Brace Cervical Brace: Hard collar Restrictions Weight Bearing Restrictions: No Pain:  Pt c/o BUE numbness; MD aware   Therapy/Group: Individual Therapy  Leroy Libman 09/20/2019, 8:16 AM

## 2019-09-20 NOTE — Progress Notes (Signed)
Occupational Therapy Session Note  Patient Details  Name: Meredith Roach MRN: [id number] Date of Birth: [birth year]  Today's Date: 09/20/2019 OT Individual Time: 1045-1130 OT Individual Time Calculation (min): 45 min    Short Term Goals: Week 3:  OT Short Term Goal 1 (Week 3): Pt will perform 1/3 toileting tasks with max A OT Short Term Goal 2 (Week 3): Pt will perform UB dressing with max A OT Short Term Goal 3 (Week 3): Pt will perform toilet transfer with max A using LRAD OT Short Term Goal 4 (Week 3): Pt will maintain standing balance with mod A during functional activity  Skilled Therapeutic Interventions/Progress Updates:    Pt resting in w/c upon arrival. OT intervention with focus on LUE functional use with table tasks. Pt with gross grasp retrieving large wooden pegs and removing to place in container.  Kinesio tape applied to B hands for edema management and reduce stiffness. Pt commented that her hands did feel better. Pt required rest breaks X 3 during task 2/2 L hand fatigue.  Pt returned to room and remained in w/c with all needs within reach and belt alarm activated.   Therapy Documentation Precautions:  Precautions Precautions: Fall, Cervical Precaution Booklet Issued: No Precaution Comments: reviewed cervical precautions Required Braces or Orthoses: Cervical Brace Cervical Brace: Hard collar Restrictions Weight Bearing Restrictions: No   Pain:  Pt c/o that L hand stiff; kinesio tape applied   Therapy/Group: Individual Therapy  Leroy Libman 09/20/2019, 12:22 PM

## 2019-09-20 NOTE — Progress Notes (Signed)
Physical Therapy Session Note  Patient Details  Name: Meredith Roach MRN: [id number] Date of Birth: 20-Mar-1941  Today's Date: 09/20/2019 PT Individual Time: 0915-1000; 5615-3794 PT Individual Time Calculation (min): 45 min and 45 min   Short Term Goals: Week 3:  PT Short Term Goal 1 (Week 3): Pt will complete least restrictive transfer with max A consistently PT Short Term Goal 2 (Week 3): Pt will ambulate x 10 ft with LRAD PT Short Term Goal 3 (Week 3): Pt will initiate w/c mobility  Skilled Therapeutic Interventions/Progress Updates:    Session 1: Pt received seated in TIS w/c in room, agreeable to PT session. No complaints of pain. Obtained 20x18 w/c for pt trial manual w/c propulsion. Slide board transfer TIS with max A x 1 and min A x 1 for safety. Slide board transfer from mat table to manual w/c with mod A going slightly downhill. Pt exhibits improved ability to assist with transfer. Trial w/c propulsion with use of RUE and BLE, pt requires mod A to propel x 10 ft. Pt does not exhibit sufficient LUE strength and grasp to utilize this UE to assist with w/c propulsion. 20x18 w/c also found to be to wide for patient. Obtained 18x16 manual w/c for improved fit. Slide board transfers mod to max A to get setup in 18x16 w/c. Added theraband to R w/c rim for improved grip. Pt is then min A for w/c propulsion x 10 ft forwards and backwards with use of RUE and BLE. Will continue to assess pt's ability to propel herself via manual w/c. Pt agreeable to stay seated in standard w/c at end of session, needs in reach, quick release belt and chair alarm in place at end of session.  Session 2: Pt received seated in w/c in room, agreeable to PT session. Discussion with pt and her husband about upcoming family education schedule for Thu and Fri. Pt is min A for manual w/c propulsion x 30 ft, x 10 ft forwards and x 10 ft, x 20 ft backwards with use of RUE and BLE. Pt continues to struggle with use of RUE for  propulsion due to weakness and fatigues quickly with manual propulsion. Discussion with pt about trying out power wheelchair mobility to allow greater independence and for energy conservation. Pt open to trialing power w/c mobility. Slide board transfer x 3 reps w/c to/from bed with max A, max cueing for body positioning. Pt exhibits an increase in fatigue this PM. Pt is max A for sit to supine for BLE management and trunk control. Pt left semi-reclined in bed with needs in reach, bed alarm in place at end of session.  Therapy Documentation Precautions:  Precautions Precautions: Fall, Cervical Precaution Booklet Issued: No Precaution Comments: reviewed cervical precautions Required Braces or Orthoses: Cervical Brace Cervical Brace: Hard collar Restrictions Weight Bearing Restrictions: No    Therapy/Group: Individual Therapy   Excell Seltzer, PT, DPT  09/20/2019, 12:51 PM

## 2019-09-20 NOTE — Progress Notes (Signed)
Orthopedic Tech Progress Note Patient Details:  Meredith Roach [birth year] [id number] Called in order to [facility]. Patient ID: Meredith Roach, female   DOB: 1941/03/11, 79 y.o.   MRN: [id number]   [name] 09/20/2019, 12:13 PM

## 2019-09-20 NOTE — Plan of Care (Signed)
Downgraded some mobility and transfer goals due to slow progress. D/c gait goal due to slow progress.

## 2019-09-20 NOTE — Progress Notes (Signed)
Patient was given suppository and digitally stimulated. Tolerated well and no hard stool noted. Patient had very small soft bowel movement.

## 2019-09-20 NOTE — Progress Notes (Signed)
[facility] PHYSICAL MEDICINE & REHABILITATION PROGRESS NOTE   Subjective/Complaints:  Pt reports has taken Duloxetine in past for nerve pain- wasn't real helpful, but before got decompression of neck- so will try again.    Had some nausea this AM- is better now- went over side effects of Duloxetine- she tolerated well in past.   Had BM ~ 10-11 pm last night AFTER bowel program.  Usually has results with bowel program.    ROS:   Pt denies SOB, abd pain, CP, N/V/C/D, and vision changes   Objective:   No results found. Recent Labs    09/18/19 0654  WBC 6.4  HGB 12.6  HCT 40.7  PLT 139*   Recent Labs    09/18/19 0654 09/19/19 0609  NA 142 138  K 5.6* 4.9  CL 104 104  CO2 27 23  GLUCOSE 100* 104*  BUN 23 21  CREATININE 0.85 0.65  CALCIUM 8.7* 8.3*    Intake/Output Summary (Last 24 hours) at 09/20/2019 0852 Last data filed at 09/19/2019 2225 Gross per 24 hour  Intake 480 ml  Output 1575 ml  Net -1095 ml     Physical Exam: Vital Signs Blood pressure (!) 120/56, pulse 81, temperature 98.2 F (36.8 C), resp. rate 17, height 5\' 3"  (1.6 m), weight 76.8 kg, SpO2 96 %. Constitutional: No distress . Vital signs reviewed. Sitting up in w/c- feeding sel fwith built up handles- OT in room, NAD HENT: Normocephalic.   Neck: C-collar.incision looks good Eyes:  Conjugate gaze Cardiovascular: RRR Respiratory: C.mlr EU:MPNT, NT, ND, (+)BS  Skin: Warm and dry.  Intact. Psych: somewhat depressed affect still-  Musc: No edema in extremities.  No tenderness in extremities. Neuro: Alert Motor RUE: Shoulder abduction, elbow flexion/extension 3/5, handgrip 3/5 Left upper extremity: Shoulder abduction, elbow flexion/extension 2/5, handgrip 2-/5, finger abd 1/5 Right lower extremity: Hip flexion, knee extension 3/5, ankle dorsiflexion 3+/5 Left lower extremity: Hip flexion, knee extension 2/5, ankle dorsiflexion 2/5  Assessment/Plan: 1. Functional deficits secondary to C3 ASIA  C quadriplegia which require 3+ hours per day of interdisciplinary therapy in a comprehensive inpatient rehab setting.  Physiatrist is providing close team supervision and 24 hour management of active medical problems listed below.  Physiatrist and rehab team continue to assess barriers to discharge/monitor patient progress toward functional and medical goals  Care Tool:  Bathing        Body parts bathed by helper: Right arm, Left arm, Chest, Abdomen, Front perineal area, Buttocks, Right upper leg, Left upper leg, Right lower leg, Left lower leg, Face     Bathing assist Assist Level: Total Assistance - Patient < 25%     Upper Body Dressing/Undressing Upper body dressing   What is the patient wearing?: Pull over shirt    Upper body assist Assist Level: Maximal Assistance - Patient 25 - 49%    Lower Body Dressing/Undressing Lower body dressing      What is the patient wearing?: Pants     Lower body assist Assist for lower body dressing: Total Assistance - Patient < 25%     Toileting Toileting Toileting Activity did not occur (Clothing management and hygiene only): (pt is incontinent, not safe to sit on a BSC due to poor trunk control)  Toileting assist Assist for toileting: Moderate Assistance - Patient 50 - 74%     Transfers Chair/bed transfer  Transfers assist     Chair/bed transfer assist level: Maximal Assistance - Patient 25 - 49%  Locomotion Ambulation   Ambulation assist   Ambulation activity did not occur: Safety/medical concerns  Assist level: 2 helpers Assistive device: Ethelene Hal Max distance: 5'   Walk 10 feet activity   Assist  Walk 10 feet activity did not occur: Safety/medical concerns        Walk 50 feet activity   Assist Walk 50 feet with 2 turns activity did not occur: Safety/medical concerns         Walk 150 feet activity   Assist Walk 150 feet activity did not occur: Safety/medical concerns         Walk 10  feet on uneven surface  activity   Assist Walk 10 feet on uneven surfaces activity did not occur: Safety/medical concerns         Wheelchair     Assist Will patient use wheelchair at discharge?: Yes Type of Wheelchair: (TBD)    Wheelchair assist level: Dependent - Patient 0% Max wheelchair distance: 150'    Wheelchair 50 feet with 2 turns activity    Assist        Assist Level: Dependent - Patient 0%   Wheelchair 150 feet activity     Assist      Assist Level: Dependent - Patient 0%   Blood pressure (!) 120/56, pulse 81, temperature 98.2 F (36.8 C), resp. rate 17, height 5\' 3"  (1.6 m), weight 76.8 kg, SpO2 96 %.  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 Status post ACDF 08/28/2019. Decadron taper. Cervical collar as directed. -PRAFOs to prevent Ankle contractures -Decadron taper completed on 6/5  6/8- has slowed recovery -depressing pt more-will start something.   Continue CIR  2. Antithrombotics: -DVT/anticoagulation:SCDs. Lovenox  -antiplatelet therapy: N/A 3. Pain Management:Baclofen 5 mg 3 times daily  oxycodone as needed  added lidoderm patch to R shoulder 9am-9pm  Gabapentin 100 twice daily started on 6/5 for neuropathic pain, improving, continue to titrate as necessary  4. Mood:Provide emotional support  6/8- starting Duloxetine 30 mg QHS- since more depressed AND to help with nerve pain.   6/9- mild nausea this AM- will monitor for side effects -antipsychotic agents: N/A 5. Neuropsych: This patientiscapable of making decisions on herown behalf. 6. Skin/Wound Care:Routine skin checksand turn while in bed every 2 hours while awake- keep on sides at night. 7. Fluids/Electrolytes/Nutrition:Routine in and outs 8. Neurogenic bowel and bladder.    [place] program. Provide  education.  Foley DC'd, increased Flomax  Continue I/O caths  6/7- doesn't want to place foley until d/c /gets results/bladder back/d/c.   6/9- bowel movement late after bowel program last night- ~ 10-11 pm- will see if gets back to going at right time.  9. GERD. Protonix 10. Hx of prediabetes confounded by steroid-induced hyperglycemia-   Monitor off steroids 11. Hx of gout-controlled 12. Leukocytosis with UTI:  Bactrim changed to Rocephin, completed course 13. Hyperkalemia  Potassium 5.1 on 6/4, labs ordered for tomorrow  6/7- K+ 5.6- give 10G Lokelma x1 and recheck labs  6/8- K+ down to 4.9 with 10 G of Lokelma   LOS: 20 days A FACE TO FACE EVALUATION WAS PERFORMED  Kailany Dinunzio 09/20/2019, 8:52 AM

## 2019-09-21 ENCOUNTER — Encounter (HOSPITAL_COMMUNITY): Payer: Medicare PPO

## 2019-09-21 ENCOUNTER — Inpatient Hospital Stay (HOSPITAL_COMMUNITY): Payer: Medicare PPO | Admitting: Physical Therapy

## 2019-09-21 ENCOUNTER — Inpatient Hospital Stay (HOSPITAL_COMMUNITY): Payer: Medicare PPO

## 2019-09-21 MED ORDER — CLOTRIMAZOLE 1 % VA CREA
1.0000 | TOPICAL_CREAM | Freq: Every day | VAGINAL | Status: AC
  Administered 2019-09-21 – 2019-09-27 (×7): 1 via VAGINAL
  Filled 2019-09-21: qty 45

## 2019-09-21 NOTE — Progress Notes (Signed)
[place] PHYSICAL MEDICINE & REHABILITATION PROGRESS NOTE   Subjective/Complaints:   Pt had small results with bowel program and another BM ~ 4am- nursing note reports yellowish d/c from mons- not clear exact location- if from urethra or vagina- ordered monistat  Did get PRAFO for L foot- feels stretch.   Nerve pain still has N/T, however is significantly improved.  Also hurt on tailbone last night- per nurse, a little pink/red, but blanchable- placed foam dressing and pt reports being turned helped.   Still no voiding.    ROS:   Pt denies SOB, abd pain, CP, N/V/C/D, and vision changes  Objective:   No results found. No results for input(s): WBC, HGB, HCT, PLT in the last 72 hours. Recent Labs    09/19/19 0609  NA 138  K 4.9  CL 104  CO2 23  GLUCOSE 104*  BUN 21  CREATININE 0.65  CALCIUM 8.3*    Intake/Output Summary (Last 24 hours) at 09/21/2019 0859 Last data filed at 09/21/2019 0730 Gross per 24 hour  Intake 1100 ml  Output 1600 ml  Net -500 ml     Physical Exam: Vital Signs Blood pressure (!) 106/51, pulse 78, temperature 98 F (36.7 C), resp. rate 17, height 5\' 3"  (1.6 m), weight 76.8 kg, SpO2 97 %. Constitutional: No distress . Vital signs reviewed. Sitting up in bed- appropriate, NAD HENT: Normocephalic.   Neck: cervical collar in place Eyes:  Conjugate gaze Cardiovascular: RRR Respiratory: CTA B/L- no W/R/R- good air movement JO:INOM, NT, ND, (+)BS  Skin: Warm and dry.  Intact. Psych: somewhat depressed affect still-  Musc: No edema in extremities.  No tenderness in extremities. Hands taped with [place] B/L Neuro: Alert Motor RUE: bicep 4/5, WE 3-/5, triceps 4-/5, grip 3+/5, finger abd 1/5 LUE- biceps 4/5, WE 2/5, triceps 4-/5, grip 2/5, finger abd 1/5- slightly improved in UEs Right lower extremity: Hip flexion, knee extension 3/5, ankle dorsiflexion 3+/5 Left lower extremity: Hip flexion, knee extension 2/5, ankle dorsiflexion  2/5  Assessment/Plan: 1. 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  Physiatrist is providing close team supervision and 24 hour management of active medical problems listed below.  Physiatrist and rehab team continue to assess barriers to discharge/monitor patient progress toward functional and medical goals  Care Tool:  Bathing        Body parts bathed by helper: Right arm, Left arm, Chest, Abdomen, Front perineal area, Buttocks, Right upper leg, Left upper leg, Right lower leg, Left lower leg, Face     Bathing assist Assist Level: Total Assistance - Patient < 25%     Upper Body Dressing/Undressing Upper body dressing   What is the patient wearing?: Pull over shirt    Upper body assist Assist Level: Maximal Assistance - Patient 25 - 49%    Lower Body Dressing/Undressing Lower body dressing      What is the patient wearing?: Pants     Lower body assist Assist for lower body dressing: Total Assistance - Patient < 25%     Toileting Toileting Toileting Activity did not occur (Clothing management and hygiene only):  (pt is incontinent, not safe to sit on a BSC due to poor trunk control)  Toileting assist Assist for toileting: Moderate Assistance - Patient 50 - 74%     Transfers Chair/bed transfer  Transfers assist     Chair/bed transfer assist level: Maximal Assistance -  Patient 25 - 49%     Locomotion Ambulation   Ambulation assist   Ambulation activity did not occur: Safety/medical concerns  Assist level: 2 helpers Assistive device: Ethelene Hal Max distance: 5'   Walk 10 feet activity   Assist  Walk 10 feet activity did not occur: Safety/medical concerns        Walk 50 feet activity   Assist Walk 50 feet with 2 turns activity did not occur: Safety/medical concerns         Walk 150 feet activity   Assist Walk 150 feet  activity did not occur: Safety/medical concerns         Walk 10 feet on uneven surface  activity   Assist Walk 10 feet on uneven surfaces activity did not occur: Safety/medical concerns         Wheelchair     Assist Will patient use wheelchair at discharge?: Yes Type of Wheelchair:  (TBD)    Wheelchair assist level: Moderate Assistance - Patient 50 - 74% Max wheelchair distance: 10'    Wheelchair 50 feet with 2 turns activity    Assist        Assist Level: Dependent - Patient 0%   Wheelchair 150 feet activity     Assist      Assist Level: Dependent - Patient 0%   Blood pressure (!) 106/51, pulse 78, temperature 98 F (36.7 C), resp. rate 17, height 5\' 3"  (1.6 m), weight 76.8 kg, SpO2 97 %.  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 Status post ACDF 08/28/2019. Decadron taper. Cervical collar as directed. -PRAFOs to prevent Ankle contractures -Decadron taper completed on 6/5  6/8- has slowed recovery -depressing pt more-will start something.   6/10- slightly improved UEs strength since last week- slowing down.  Continue CIR  2. Antithrombotics: -DVT/anticoagulation:SCDs. Lovenox  -antiplatelet therapy: N/A 3. Pain Management:Baclofen 5 mg 3 times daily  oxycodone as needed  added lidoderm patch to R shoulder 9am-9pm  Gabapentin 100 twice daily started on 6/5 for neuropathic pain, improving, continue to titrate as necessary  4. Mood:Provide emotional support  6/8- starting Duloxetine 30 mg QHS- since more depressed AND to help with nerve pain.   6/9- mild nausea this AM- will monitor for side effects  6/10- nerve pain better- not resolved -antipsychotic agents: N/A 5. Neuropsych: This patientiscapable of making decisions on herown behalf. 6. Skin/Wound Care:Routine skin checksand  turn while in bed every 2 hours while awake- keep on sides at night. 7. Fluids/Electrolytes/Nutrition:Routine in and outs 8. Neurogenic bowel and bladder.    [place] program. Provide education.  Foley DC'd, increased Flomax  Continue I/O caths  6/7- doesn't want to place foley until d/c /gets results/bladder back/d/c.   6/9- bowel movement late after bowel program last night- ~ 10-11 pm- will see if gets back to going at right time.   6/10- 2 Bms last night- con't bowel program 9. GERD. Protonix 10. Hx of prediabetes confounded by steroid-induced hyperglycemia-   Monitor off steroids 11. Hx of gout-controlled 12. Leukocytosis with UTI:  Bactrim changed to Rocephin, completed course 13. Hyperkalemia  Potassium 5.1 on 6/4, labs ordered for tomorrow  6/7- K+ 5.6- give 10G Lokelma x1 and recheck labs  6/8- K+ down to 4.9 with 10 G of Lokelma  6/10- check labs in AM  LOS: 21 days A FACE TO FACE EVALUATION WAS PERFORMED  Mollyann Halbert 09/21/2019, 8:59 AM

## 2019-09-21 NOTE — Progress Notes (Signed)
Physical Therapy Session Note  Patient Details  Name: CARLINDA OHLSON MRN: [id number] Date of Birth: 02/05/1941  Today's Date: 09/21/2019 PT Individual Time: 1104-1204 and 1330-1430 PT Individual Time Calculation (min): 60 min and 60 min Short Term Goals: Week 1:  PT Short Term Goal 1 (Week 1): Pt will perform bed mobility with assist x 1 PT Short Term Goal 1 - Progress (Week 1): Met PT Short Term Goal 2 (Week 1): Pt will perform least restrictive transfer with assist x 1 PT Short Term Goal 2 - Progress (Week 1): Met PT Short Term Goal 3 (Week 1): Pt will initiate gait training as safe and able PT Short Term Goal 3 - Progress (Week 1): Progressing toward goal Week 2:  PT Short Term Goal 1 (Week 2): Pt will perform least restrictive transfer with max A consistently PT Short Term Goal 1 - Progress (Week 2): Progressing toward goal PT Short Term Goal 2 (Week 2): Pt will initiate gait training PT Short Term Goal 2 - Progress (Week 2): Met PT Short Term Goal 3 (Week 2): Pt will maintain static sitting balance with close SBA x 5 min PT Short Term Goal 3 - Progress (Week 2): Progressing toward goal Week 3:  PT Short Term Goal 1 (Week 3): Pt will complete least restrictive transfer with max A consistently PT Short Term Goal 2 (Week 3): Pt will ambulate x 10 ft with LRAD PT Short Term Goal 3 (Week 3): Pt will initiate w/c mobility  Skilled Therapeutic Interventions/Progress Updates:    SESSION ONE: Family Training PAIN denies pain  Daughter and husband present and very open to training w/hoyer lift transfers, discussion of wc/seating options/ discussion of equipment needs/PT role in faciliation of DME  Pt iniitally supine.  PT rolls w/max assist of family who then demonstrates  Correct placement of hoyer lift sling.  Therapist then demonstrated operation of hoyer lift controls, discussed set up/placement/use of hospital bed features/positioning of wc/use of tilt feature of wc,  locking/unclocking of brakes of all devices. With therapist verbal instruction and occasional assist, family able to demonstrate safe transfer from bed to wc and then wc to bed using hoyer lift.  Discussed importance of removing sling while pt oob in wc for skin/pressure relief/temp control.   Daughter/husband then demonstrated safe bed to wc transfer of pt w/hoyer lift w/very few verbal instructions required by therapist.  Also discussed using tilt to reposition pt in wc w/assist of gravity and demonstrated.    Pt then transported to gym by therapist and performed sit to stand in [place] w/mod assist and sets of semistand to stand in [place] w/emphasis of achieving full upright posture w/increased gult/extensor activation.   8reps x 1, seated rest, 7reps x 1.  Stand to sit in wc w/mod assist to control descent. Pt transported back to room at end of session and left oob in wc w/family present, needs in reach.  SESSION TWO: PAIN denies pain Pt OOB in wc and agreeable to attempting LiteGait to initiate gait training.  Pt transported to gym for session. Introduced to [employer] system.  Therapist partially donned harness in sitting, STS w/max assist to [place] and therapist secured groin strap of harness and attached to frame.  Pt then performed Gait x 72f w/assist to navigate LiteGait and second PT for manual facilitation of wt shifting esp to L w/advancement of RLE which deteriorates w/fatigue.  Initially advanced bilat w/min assist for wt shifting/mod assist w/fatigue, verbal cues to encourage glut and  quad activation w/loading to midstance.  Pt rested in sitting then repeated gait as described above.  Following gait, pt rested in sitting, harness removed by therapist, and pt repositioned using tilt feature of chair to facilitate positioning. Pt very pleased following gait stating "I am so happy, I needed that today".   Pt then transported to wc room where therapist obtained [place].  Therapist demonstrated  operation of wc for propulsion using joystick/only avail option.  Demonstrated tilt/elevation/legrest adjustments, use of charger, operation of brakes/manual control.  Daughter present for this training as well.  Plan to continue w/this training next PT session as well as LiteGait. At end of session pt left OOB in manual tilt wc w/needs in reach and daughter at bedside.    Therapy Documentation Precautions:  Precautions Precautions: Fall, Cervical Precaution Booklet Issued: No Precaution Comments: reviewed cervical precautions Required Braces or Orthoses: Cervical Brace Cervical Brace: Hard collar Restrictions Weight Bearing Restrictions: No    Therapy/Group: Individual Therapy  Callie Fielding, [place] 09/21/2019, 12:15 PM

## 2019-09-21 NOTE — Progress Notes (Signed)
Occupational Therapy Session Note  Patient Details  Name: Meredith Roach MRN: [id number] Date of Birth: 05/09/1940  Today's Date: 09/21/2019 OT Individual Time: 1000-1100 OT Individual Time Calculation (min): 60 min    Short Term Goals: Week 1:  OT Short Term Goal 1 (Week 1): Pt will be able to maintain static sit EOB with close S to engage more actively in UB self care. OT Short Term Goal 1 - Progress (Week 1): Met OT Short Term Goal 2 (Week 1): Pt will demonstrate improved R hand function to hold wash cloth in hand to wash thighs. OT Short Term Goal 2 - Progress (Week 1): Met OT Short Term Goal 3 (Week 1): Pt will be able to actively use hands to pull sleeves over arms. OT Short Term Goal 3 - Progress (Week 1): Progressing toward goal OT Short Term Goal 4 (Week 1): Pt will be able to rise to stand in [place] lift with mod A to work towards completing [facility] transfers. OT Short Term Goal 4 - Progress (Week 1): Met  Skilled Therapeutic Interventions/Progress Updates:    1:1. Pt received in bed with family present. Daughter already donned pants with patient. Daughter and husand present for family ed session. Reviewed changing of pads on cervical collar, rolling techniques, pressure relief options in various w/c's, locking w/c breaks, rolling and placing hoyer sling, safety with hoyer transfers, bathing/dressing at bed level, bed pan use for toileting and body mechanics with hospital bed. Daughter and husband both changed cervical pads with VC for pressing pad down into bed and sliding behind pt. Pt husband required increased cues for not pulling on arms to roll pt and use of shoulder/hip to roll in B directions. Both family members able to place sling with VC and remove sling with no VC working together. PT entering as OT exits to continue with hoyer training.   Therapy Documentation Precautions:  Precautions Precautions: Fall, Cervical Precaution Booklet Issued: No Precaution Comments:  reviewed cervical precautions Required Braces or Orthoses: Cervical Brace Cervical Brace: Hard collar Restrictions Weight Bearing Restrictions: No General:   Vital Signs:   Pain:   ADL: ADL Eating: Minimal assistance Grooming: Moderate assistance Upper Body Bathing: Maximal assistance (with hand over hand A to hold washcloth) Lower Body Bathing: Dependent Upper Body Dressing: Dependent Where Assessed-Upper Body Dressing: Edge of bed Lower Body Dressing: Dependent Where Assessed-Lower Body Dressing: Bed level Toilet Transfer: Unable to assess Tub/Shower Transfer Method: Unable to assess Vision   Perception    Praxis   Exercises:   Other Treatments:     Therapy/Group: Individual Therapy  Tonny Branch 09/21/2019, 11:03 AM

## 2019-09-21 NOTE — Progress Notes (Signed)
Pt had medium size BM. Pts sacrum area noted to to be red and blanchable. Barrier cream and foam applied to the pts sacrum. Pt also noted to have large amount of yellow/green discharge when peri care was performed.

## 2019-09-21 NOTE — Progress Notes (Signed)
Physical Therapy Session Note  Patient Details  Name: Meredith Roach MRN: [id number] Date of Birth: 12/15/1940  Today's Date: 09/21/2019 PT Individual Time: 1710-1725 PT Individual Time Calculation (min): 15 min   Short Term Goals: Week 2:  PT Short Term Goal 1 (Week 2): Pt will perform least restrictive transfer with max A consistently PT Short Term Goal 1 - Progress (Week 2): Progressing toward goal PT Short Term Goal 2 (Week 2): Pt will initiate gait training PT Short Term Goal 2 - Progress (Week 2): Met PT Short Term Goal 3 (Week 2): Pt will maintain static sitting balance with close SBA x 5 min PT Short Term Goal 3 - Progress (Week 2): Progressing toward goal Week 3:  PT Short Term Goal 1 (Week 3): Pt will complete least restrictive transfer with max A consistently PT Short Term Goal 2 (Week 3): Pt will ambulate x 10 ft with LRAD PT Short Term Goal 3 (Week 3): Pt will initiate w/c mobility  Skilled Therapeutic Interventions/Progress Updates:   Pt received supine in bed and agreeable to PT. Supine NMR knee/hip flexion extension in symmetry 2 x 10 BLE. Hip abduction x 10 BLE. PT noted power WC in pt's room. Assessed grasp strength in BLE and noted poor opposition in [place]. Attempted to locate "field goal" joystick for improved control with power WC, but PT unsuccessful. Pt dinner present and requesting to eat. Pt left supine in bed with call bell in reach and NT present.      Therapy Documentation Precautions:  Precautions Precautions: Fall, Cervical Precaution Booklet Issued: No Precaution Comments: reviewed cervical precautions Required Braces or Orthoses: Cervical Brace Cervical Brace: Hard collar Restrictions Weight Bearing Restrictions: No    Vital Signs: Therapy Vitals Temp: 97.7 F (36.5 C) Pulse Rate: 71 Resp: 17 BP: (!) 98/43 Patient Position (if appropriate): Lying Oxygen Therapy SpO2: 97 % O2 Device: Room Air Pain: Pain Assessment Pain Scale: 0-10 Pain  Score: 0-No pain    Therapy/Group: Individual Therapy  Lorie Phenix 09/21/2019, 11:13 PM

## 2019-09-21 NOTE — Progress Notes (Signed)
Pt had medium size incontinent BM, in/out cath performed. Pt stated she could feel pressure from bladder being full. Pt tolerated the procedure well. Peri care performed, yellow/green mucous discharge noted. Monistat given. Pt positioned on her R side to sleep.

## 2019-09-22 ENCOUNTER — Ambulatory Visit (HOSPITAL_COMMUNITY): Payer: Medicare PPO

## 2019-09-22 ENCOUNTER — Encounter (HOSPITAL_COMMUNITY): Payer: Medicare PPO

## 2019-09-22 LAB — CBC WITH DIFFERENTIAL/PLATELET
Abs Immature Granulocytes: 0.03 10*3/uL (ref 0.00–0.07)
Basophils Absolute: 0 10*3/uL (ref 0.0–0.1)
Basophils Relative: 1 %
Eosinophils Absolute: 0.1 10*3/uL (ref 0.0–0.5)
Eosinophils Relative: 3 %
HCT: 37.1 % (ref 36.0–46.0)
Hemoglobin: 11.8 g/dL — ABNORMAL LOW (ref 12.0–15.0)
Immature Granulocytes: 1 %
Lymphocytes Relative: 32 %
Lymphs Abs: 1.3 10*3/uL (ref 0.7–4.0)
MCH: 28.6 pg (ref 26.0–34.0)
MCHC: 31.8 g/dL (ref 30.0–36.0)
MCV: 90 fL (ref 80.0–100.0)
Monocytes Absolute: 0.3 10*3/uL (ref 0.1–1.0)
Monocytes Relative: 8 %
Neutro Abs: 2.2 10*3/uL (ref 1.7–7.7)
Neutrophils Relative %: 55 %
Platelets: 139 10*3/uL — ABNORMAL LOW (ref 150–400)
RBC: 4.12 MIL/uL (ref 3.87–5.11)
RDW: 13.5 % (ref 11.5–15.5)
WBC: 3.9 10*3/uL — ABNORMAL LOW (ref 4.0–10.5)
nRBC: 0.5 % — ABNORMAL HIGH (ref 0.0–0.2)

## 2019-09-22 LAB — BASIC METABOLIC PANEL
Anion gap: 10 (ref 5–15)
BUN: 17 mg/dL (ref 8–23)
CO2: 25 mmol/L (ref 22–32)
Calcium: 8.4 mg/dL — ABNORMAL LOW (ref 8.9–10.3)
Chloride: 104 mmol/L (ref 98–111)
Creatinine, Ser: 0.72 mg/dL (ref 0.44–1.00)
GFR calc Af Amer: >60 mL/min (ref >60)
GFR calc non Af Amer: >60 mL/min (ref >60)
Glucose, Bld: 106 mg/dL — ABNORMAL HIGH (ref 70–99)
Potassium: 4.3 mmol/L (ref 3.5–5.1)
Sodium: 139 mmol/L (ref 135–145)

## 2019-09-22 NOTE — Progress Notes (Signed)
Occupational Therapy Session Note  Patient Details  Name: Meredith Roach MRN: [id number] Date of Birth: 25-Sep-1940  Today's Date: 09/22/2019 OT Individual Time: 1000-1100 OT Individual Time Calculation (min): 60 min    Short Term Goals: Week 1:  OT Short Term Goal 1 (Week 1): Pt will be able to maintain static sit EOB with close S to engage more actively in UB self care. OT Short Term Goal 1 - Progress (Week 1): Met OT Short Term Goal 2 (Week 1): Pt will demonstrate improved R hand function to hold wash cloth in hand to wash thighs. OT Short Term Goal 2 - Progress (Week 1): Met OT Short Term Goal 3 (Week 1): Pt will be able to actively use hands to pull sleeves over arms. OT Short Term Goal 3 - Progress (Week 1): Progressing toward goal OT Short Term Goal 4 (Week 1): Pt will be able to rise to stand in [place] lift with mod A to work towards completing [facility] transfers. OT Short Term Goal 4 - Progress (Week 1): Met  Skilled Therapeutic Interventions/Progress Updates:    1:1. Pt received in bed with husband present. Daughter will be bathing patient and feels comfortable without further education per husband. Husband dresses pt with VC for body mechanics, lifting bed to proper height to decrease bending and energy conservaiton techniques (threading 1LE, ted, and socks before walking around to do the other leg). Husband able to place hoyer sling and roll pt. Husband able to manage hoyer with OT asissting guiding pt back into w/c. OT and husband scoot pt back into chair with lateral leans. OT also educates on using tilt feature to get pt aligned better/scooted back. Exited session with pt seated in PWC and PT present  Therapy Documentation Precautions:  Precautions Precautions: Fall, Cervical Precaution Booklet Issued: No Precaution Comments: reviewed cervical precautions Required Braces or Orthoses: Cervical Brace Cervical Brace: Hard collar Restrictions Weight Bearing Restrictions:  No General:   Vital Signs:   Pain:   ADL: ADL Eating: Minimal assistance Grooming: Moderate assistance Upper Body Bathing: Maximal assistance (with hand over hand A to hold washcloth) Lower Body Bathing: Dependent Upper Body Dressing: Dependent Where Assessed-Upper Body Dressing: Edge of bed Lower Body Dressing: Dependent Where Assessed-Lower Body Dressing: Bed level Toilet Transfer: Unable to assess Tub/Shower Transfer Method: Unable to assess Vision   Perception    Praxis   Exercises:   Other Treatments:     Therapy/Group: Individual Therapy  Tonny Branch 09/22/2019, 12:15 PM

## 2019-09-22 NOTE — Progress Notes (Signed)
Occupational Therapy Session Note  Patient Details  Name: Meredith Roach MRN: [id number] Date of Birth: 28-Sep-1940  Today's Date: 09/22/2019 OT Individual Time: 1300-1400 OT Individual Time Calculation (min): 60 min    Short Term Goals: Week 1:  OT Short Term Goal 1 (Week 1): Pt will be able to maintain static sit EOB with close S to engage more actively in UB self care. OT Short Term Goal 1 - Progress (Week 1): Met OT Short Term Goal 2 (Week 1): Pt will demonstrate improved R hand function to hold wash cloth in hand to wash thighs. OT Short Term Goal 2 - Progress (Week 1): Met OT Short Term Goal 3 (Week 1): Pt will be able to actively use hands to pull sleeves over arms. OT Short Term Goal 3 - Progress (Week 1): Progressing toward goal OT Short Term Goal 4 (Week 1): Pt will be able to rise to stand in [place] lift with mod A to work towards completing [facility] transfers. OT Short Term Goal 4 - Progress (Week 1): Met  Skilled Therapeutic Interventions/Progress Updates:    1:1. Pt received in bed agreeable to OT but requesting to stay in bed d/t fatigue. Communicated with pt and husband about w/c eval on Monday as well as session at 11am with OT to do more family ed with other daughter. Remainder of session focus on UB therex/conditioning activities for BUE AROM, coordination, timing and activity tolerance. Pt completes 2x12 dowel rod therex (BUE ACE wrapped for grip deficits) shoulder flex/ext, row, elbow flex/ext and chest press with min manual resistance provided by OT and occasional min facilitation for elbow extension on L. Pt completes 4x1 min on/off of beach ball volley with 2# dowel rod for conditioning. Boxing activity in supported long sitting with pt punching to target/coordinating R v L upon command. Exited session with pt seated in bed, exit alarm on and call light in reach  Therapy Documentation Precautions:  Precautions Precautions: Fall, Cervical Precaution Booklet Issued:  No Precaution Comments: reviewed cervical precautions Required Braces or Orthoses: Cervical Brace Cervical Brace: Hard collar Restrictions Weight Bearing Restrictions: No General:   Vital Signs:   Pain:   ADL: ADL Eating: Minimal assistance Grooming: Moderate assistance Upper Body Bathing: Maximal assistance (with hand over hand A to hold washcloth) Lower Body Bathing: Dependent Upper Body Dressing: Dependent Where Assessed-Upper Body Dressing: Edge of bed Lower Body Dressing: Dependent Where Assessed-Lower Body Dressing: Bed level Toilet Transfer: Unable to assess Tub/Shower Transfer Method: Unable to assess Vision   Perception    Praxis   Exercises:   Other Treatments:     Therapy/Group: Individual Therapy  Tonny Branch 09/22/2019, 1:58 PM

## 2019-09-22 NOTE — Progress Notes (Signed)
Pt had sm incontinent BM. In/Out cath performed, 500 ml. Pt tolerated procedure well. Denies pain at this time.

## 2019-09-22 NOTE — Progress Notes (Signed)
[facility] PHYSICAL MEDICINE & REHABILITATION PROGRESS NOTE   Subjective/Complaints:   Pt reports BM with bowel program last night- no voiding yet  Slept well on side- not back last night- walked yesterday with lite gait- doesn't know distance.     ROS:   Pt denies SOB, abd pain, CP, N/V/C/D, and vision changes  Objective:   No results found. Recent Labs    09/22/19 0636  WBC 3.9*  HGB 11.8*  HCT 37.1  PLT 139*   Recent Labs    09/22/19 0636  NA 139  K 4.3  CL 104  CO2 25  GLUCOSE 106*  BUN 17  CREATININE 0.72  CALCIUM 8.4*    Intake/Output Summary (Last 24 hours) at 09/22/2019 0912 Last data filed at 09/22/2019 0329 Gross per 24 hour  Intake 980 ml  Output 2400 ml  Net -1420 ml     Physical Exam: Vital Signs Blood pressure 101/60, pulse 80, temperature 98.5 F (36.9 C), resp. rate 17, height 5\' 3"  (1.6 m), weight 76.8 kg, SpO2 93 %. Constitutional: No distress . Vital signs reviewed. Sitting up in bed- watching TV, appropriate, NAD HENT: Normocephalic.  L eye/conjunctiva a little pink/red- no drainage (pt said common due to allergies) Neck: cervical collar in place Eyes:  Conjugate gaze Cardiovascular: RRR Respiratory: CTA B/L- no W/R/R- good air movement GB:TDVV, NT, ND, (+)BS  Skin: Warm and dry.  Intact. Psych: brighter affect Musc: No edema in extremities.  No tenderness in extremities. Hands taped with Ktape B/L Neuro: Ox3 Motor RUE: bicep 4/5, WE 3-/5, triceps 4-/5, grip 3+/5, finger abd 1/5 LUE- biceps 4/5, WE 2/5, triceps 4-/5, grip 2/5, finger abd 1/5- slightly improved in UEs Right lower extremity: Hip flexion, knee extension 3/5, ankle dorsiflexion 3+/5 Left lower extremity: Hip flexion, knee extension 2/5, ankle dorsiflexion 2/5  Assessment/Plan: 1. 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  Physiatrist is providing  close team supervision and 24 hour management of active medical problems listed below.  Physiatrist and rehab team continue to assess barriers to discharge/monitor patient progress toward functional and medical goals  Care Tool:  Bathing        Body parts bathed by helper: Right arm, Left arm, Chest, Abdomen, Front perineal area, Buttocks, Right upper leg, Left upper leg, Right lower leg, Left lower leg, Face     Bathing assist Assist Level: Total Assistance - Patient < 25%     Upper Body Dressing/Undressing Upper body dressing   What is the patient wearing?: Pull over shirt    Upper body assist Assist Level: Maximal Assistance - Patient 25 - 49%    Lower Body Dressing/Undressing Lower body dressing      What is the patient wearing?: Pants     Lower body assist Assist for lower body dressing: Total Assistance - Patient < 25%     Toileting Toileting Toileting Activity did not occur (Clothing management and hygiene only):  (pt is incontinent, not safe to sit on a BSC due to poor trunk control)  Toileting assist Assist for toileting: Moderate Assistance - Patient 50 - 74%     Transfers Chair/bed transfer  Transfers assist     Chair/bed transfer assist level: Dependent - mechanical lift (family training w/manual hoyer lift)     Locomotion Ambulation   Ambulation assist   Ambulation activity did not occur: Safety/medical concerns  Assist level: 2 helpers Assistive  device: Lite Gait Max distance: 12   Walk 10 feet activity   Assist  Walk 10 feet activity did not occur: Safety/medical concerns  Assist level: 2 helpers Assistive device: Lite Gait   Walk 50 feet activity   Assist Walk 50 feet with 2 turns activity did not occur: Safety/medical concerns         Walk 150 feet activity   Assist Walk 150 feet activity did not occur: Safety/medical concerns         Walk 10 feet on uneven surface  activity   Assist Walk 10 feet on uneven  surfaces activity did not occur: Safety/medical concerns         Wheelchair     Assist Will patient use wheelchair at discharge?: Yes Type of Wheelchair:  (TBD)    Wheelchair assist level: Moderate Assistance - Patient 50 - 74% Max wheelchair distance: 10'    Wheelchair 50 feet with 2 turns activity    Assist        Assist Level: Dependent - Patient 0%   Wheelchair 150 feet activity     Assist      Assist Level: Dependent - Patient 0%   Blood pressure 101/60, pulse 80, temperature 98.5 F (36.9 C), resp. rate 17, height 5\' 3"  (1.6 m), weight 76.8 kg, SpO2 93 %.  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 Status post ACDF 08/28/2019. Decadron taper. Cervical collar as directed. -PRAFOs to prevent Ankle contractures -Decadron taper completed on 6/5  6/8- has slowed recovery -depressing pt more-will start something.   6/10- slightly improved UEs strength since last week- slowing down.  Continue CIR  2. Antithrombotics: -DVT/anticoagulation:SCDs. Lovenox  -antiplatelet therapy: N/A 3. Pain Management:Baclofen 5 mg 3 times daily  oxycodone as needed  added lidoderm patch to R shoulder 9am-9pm  Gabapentin 100 twice daily started on 6/5 for neuropathic pain, improving, continue to titrate as necessary  4. Mood:Provide emotional support  6/8- starting Duloxetine 30 mg QHS- since more depressed AND to help with nerve pain.   6/9- mild nausea this AM- will monitor for side effects  6/10- nerve pain better- not resolved -antipsychotic agents: N/A 5. Neuropsych: This patientiscapable of making decisions on herown behalf. 6. Skin/Wound Care:Routine skin checksand turn while in bed every 2 hours while awake- keep on sides at night. 7. Fluids/Electrolytes/Nutrition:Routine in and outs 8. Neurogenic  bowel and bladder.    [place] program. Provide education.  Foley DC'd, increased Flomax  Continue I/O caths  6/11- no voiding on Max dose of Flomax  -having BMs with bowel program 9. GERD. Protonix 10. Hx of prediabetes confounded by steroid-induced hyperglycemia-   Monitor off steroids 11. Hx of gout-controlled 12. Leukocytosis with UTI:  Bactrim changed to Rocephin, completed course 13. Hyperkalemia  Potassium 5.1 on 6/4, labs ordered for tomorrow  6/7- K+ 5.6- give 10G Lokelma x1 and recheck labs  6/8- K+ down to 4.9 with 10 G of Lokelma  6/10- check labs in AM  6/11- K+ 4.3 14. Thrombocytopenia  6/11- Plts down to 139k- lower but stable  LOS: 22 days A FACE TO FACE EVALUATION WAS PERFORMED  Meredith Roach 09/22/2019, 9:12 AM

## 2019-09-22 NOTE — Progress Notes (Addendum)
Physical Therapy Weekly Progress Note  Patient Details  Name: Meredith Roach MRN: [id number] Date of Birth: Dec 22, 1940  Beginning of progress report period: September 15, 2018 End of progress report period: September 22, 2018  Today's Date: 09/22/2019 PT Individual Time: 1100-1200 PT Individual Time Calculation (min): 60 min   Patient has met 2 of 3 short term goals.  Treament this week has shifted focus to family training in preparation for DC.  Training w/hoyer lift determined to be safest for family due to pt continued difficulty w/SBT/balance/strenght.  Patient continues to demonstrate the following deficits muscle weakness, decreased cardiorespiratoy endurance, impaired timing and sequencing, unbalanced muscle activation and decreased coordination and decreased sitting balance, decreased standing balance, decreased postural control and decreased balance strategies and therefore will continue to benefit from skilled PT intervention to increase functional independence with mobility.  Patient progressing toward long term goals..  Continue plan of care.  wc evaluation set up w/[facility] for 09/22/2019 to educate family/pt and order appropriate wc for pt/family  PT Short Term Goals Week 1:  PT Short Term Goal 1 (Week 1): Pt will perform bed mobility with assist x 1 PT Short Term Goal 1 - Progress (Week 1): Met PT Short Term Goal 2 (Week 1): Pt will perform least restrictive transfer with assist x 1 PT Short Term Goal 2 - Progress (Week 1): Met PT Short Term Goal 3 (Week 1): Pt will initiate gait training as safe and able PT Short Term Goal 3 - Progress (Week 1): Met Week 2:  PT Short Term Goal 1 (Week 2): Pt will perform least restrictive transfer with max A consistently PT Short Term Goal 1 - Progress (Week 2): Met PT Short Term Goal 2 (Week 2): Pt will initiate gait training PT Short Term Goal 2 - Progress (Week 2): Met PT Short Term Goal 3 (Week 2): Pt will maintain static sitting balance  with close SBA x 5 min PT Short Term Goal 3 - Progress (Week 2): Progressing toward goal Week 3:  PT Short Term Goal 1 (Week 3): Pt will complete least restrictive transfer with max A consistently PT Short Term Goal 1 - Progress (Week 3): Progressing toward goal PT Short Term Goal 2 (Week 3): Pt will ambulate x 10 ft with LRAD PT Short Term Goal 2 - Progress (Week 3): Progressing toward goal PT Short Term Goal 3 (Week 3): Pt will initiate w/c mobility PT Short Term Goal 3 - Progress (Week 3): Met Week 4:  PT Short Term Goal 1 (Week 4): family will be independent w/hoyer lift transfers bed to/from wc PT Short Term Goal 2 (Week 4): pt will be independent w/PWC operation including propulsion and TIS features PT Short Term Goal 3 (Week 4): gait 19f in LRAD/LiteGait w/min assist 50% steps for clearance/advancement  Skilled Therapeutic Interventions/Progress Updates:  Ambulation/gait training;Balance/vestibular training;Community reintegration;Discharge planning;Disease management/prevention;DME/adaptive equipment instruction;Functional electrical stimulation;Functional mobility training;Neuromuscular re-education;Pain management;Patient/family education;Psychosocial support;Splinting/orthotics;Stair training;Therapeutic Activities;Therapeutic Exercise;UE/LE Strength taining/ROM;UE/LE Coordination activities;Wheelchair propulsion/positioning    Pt initially oob in wc and husband present in room.  Began educating husband re:  Power wc and discussed operation, manual and Power modes, instructed w/manual operation and had husband push wc to feel difference between PWC and TIS manual, patient then educated on operation and propelled wc >250 w/supervision only in lowest speed setting.  Treatment session then transitioned to gait training w/LIteGait.  Husband present and willing to assist w/this activity.  Harness applied by therapist, pt wt shifts w/min assist in  wc for this.  STS w/mod assist and harness  fastened in groin.  Returns to sitting w/min assist to fasten harness to [place] Husband instructed w/operation of brakes and navigation of litegait.  STS w/mod assist then gait trial as follows:  67f x 1 w/mod assist for advancement thru swing - pt unable to clear 90% steps past midswing without assist to complete advancement.  Cues to activate quads/gluts w/loading and to maintain thru stance, assist for wtshifting. Seated rest x 5-6 min.  Continued discussion of wc options w/pt and husband.  Therapist contacted rep at [facility] scheduling wc ordering/evaluation w/therapist, scheduled for 8:00 am 6/14 and pt notified.  Gait 814fx 1, rest in standing x 1.51m54mw/cues for quad activation to maintain upright, continued gait x 5ft20feviations as described above.   Harness removed by therapist, pt wtshifts in wc w/min assist.  Pt able to tilt wc fully w/verbal cues/instruction for repositioning in wc.   Pt then propels back to room w/supervision.  Husband able to manually maneuver wc into room. Pt left oob in wc w/alarm needs in reach  Therapy Documentation Precautions:  Precautions Precautions: Fall, Cervical Precaution Booklet Issued: No Precaution Comments: reviewed cervical precautions Required Braces or Orthoses: Cervical Brace Cervical Brace: Hard collar Restrictions Weight Bearing Restrictions: No   Therapy/Group: Individual Therapy  BarbCallie Fielding  [place]1/2021, 1:04 PM

## 2019-09-23 NOTE — Progress Notes (Signed)
Pt had medium size incontinent BM. Pt also required In/Out cath due to high volume, tolerated procedure well. Pt had small amount of green/yellow discharge noted in the peri area. Peri care performed. Pt resting in bed at this time. Denies pain.

## 2019-09-23 NOTE — Progress Notes (Signed)
Completed dig stim. Pt had large bowel movement. No other concerns to report.

## 2019-09-23 NOTE — Progress Notes (Signed)
[facility] PHYSICAL MEDICINE & REHABILITATION PROGRESS NOTE   Subjective/Complaints:  In bed working with hands. Does a lot of stretching/ROM on her own.   ROS: Patient denies fever, rash, sore throat, blurred vision, nausea, vomiting, diarrhea, cough, shortness of breath or chest pain, joint or back pain, headache, or mood change.   Objective:   No results found. Recent Labs    09/22/19 0636  WBC 3.9*  HGB 11.8*  HCT 37.1  PLT 139*   Recent Labs    09/22/19 0636  NA 139  K 4.3  CL 104  CO2 25  GLUCOSE 106*  BUN 17  CREATININE 0.72  CALCIUM 8.4*    Intake/Output Summary (Last 24 hours) at 09/23/2019 0918 Last data filed at 09/23/2019 0346 Gross per 24 hour  Intake 600 ml  Output 1950 ml  Net -1350 ml     Physical Exam: Vital Signs Blood pressure (!) 104/59, pulse 71, temperature 98.6 F (37 C), resp. rate 16, height 5\' 3"  (1.6 m), weight 76.8 kg, SpO2 97 %. Constitutional: No distress . Vital signs reviewed. HEENT: EOMI, oral membranes moist Neck: supple Cardiovascular: RRR without murmur. No JVD    Respiratory/Chest: CTA Bilaterally without wheezes or rales. Normal effort    GI/Abdomen: BS +, non-tender, non-distended Ext: no clubbing, cyanosis, or edema Psych: pleasant and cooperative Skin: Warm and dry.  Intact. Musc: No edema in extremities.  No tenderness in extremities. Hands taped with Ktape B/L Neuro: Ox3 Motor RUE: bicep 4/5, WE 3-/5, triceps 4-/5, grip 3+/5, finger abd 1+ to 2/5 LUE- biceps 4/5, WE 2/5, triceps 4-/5, grip 2+/5, finger abd 1/5- slightly improved in UEs Right lower extremity: Hip flexion, knee extension 3/5, ankle dorsiflexion 3+/5 Left lower extremity: Hip flexion, knee extension 2/5, ankle dorsiflexion 2/5  Assessment/Plan: 1. 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  Physiatrist is providing close team  supervision and 24 hour management of active medical problems listed below.  Physiatrist and rehab team continue to assess barriers to discharge/monitor patient progress toward functional and medical goals  Care Tool:  Bathing        Body parts bathed by helper: Right arm, Left arm, Chest, Abdomen, Front perineal area, Buttocks, Right upper leg, Left upper leg, Right lower leg, Left lower leg, Face     Bathing assist Assist Level: Total Assistance - Patient < 25%     Upper Body Dressing/Undressing Upper body dressing   What is the patient wearing?: Pull over shirt    Upper body assist Assist Level: Maximal Assistance - Patient 25 - 49%    Lower Body Dressing/Undressing Lower body dressing      What is the patient wearing?: Pants     Lower body assist Assist for lower body dressing: Total Assistance - Patient < 25%     Toileting Toileting Toileting Activity did not occur (Clothing management and hygiene only):  (pt is incontinent, not safe to sit on a BSC due to poor trunk control)  Toileting assist Assist for toileting: Moderate Assistance - Patient 50 - 74%     Transfers Chair/bed transfer  Transfers assist     Chair/bed transfer assist level: Dependent - mechanical lift     Locomotion Ambulation   Ambulation assist   Ambulation activity did not occur: Safety/medical concerns  Assist level: 2 helpers Assistive device: Lite Gait Max distance: 10   Walk 10 feet activity  Assist  Walk 10 feet activity did not occur: Safety/medical concerns  Assist level: 2 helpers Assistive device: Lite Gait   Walk 50 feet activity   Assist Walk 50 feet with 2 turns activity did not occur: Safety/medical concerns         Walk 150 feet activity   Assist Walk 150 feet activity did not occur: Safety/medical concerns         Walk 10 feet on uneven surface  activity   Assist Walk 10 feet on uneven surfaces activity did not occur: Safety/medical  concerns         Wheelchair     Assist Will patient use wheelchair at discharge?: Yes Type of Wheelchair:  (to be determined)    Wheelchair assist level: Supervision/Verbal cueing (power wc) Max wheelchair distance: 250    Wheelchair 50 feet with 2 turns activity    Assist        Assist Level: Supervision/Verbal cueing   Wheelchair 150 feet activity     Assist      Assist Level: Supervision/Verbal cueing   Blood pressure (!) 104/59, pulse 71, temperature 98.6 F (37 C), resp. rate 16, height 5\' 3"  (1.6 m), weight 76.8 kg, SpO2 97 %.  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 Status post ACDF 08/28/2019. Decadron taper. Cervical collar as directed. -PRAFOs to prevent Ankle contractures -Decadron taper completed on 6/5  6/8- has slowed recovery -depressing pt more-will start something.   6/10- slightly improved UEs strength since last week- slowing down.  Continue CIR  2. Antithrombotics: -DVT/anticoagulation:SCDs. Lovenox  -antiplatelet therapy: N/A 3. Pain Management:Baclofen 5 mg 3 times daily  oxycodone as needed  added lidoderm patch to R shoulder 9am-9pm  Gabapentin 100 twice daily started on 6/5 for neuropathic pain, improving, continue to titrate as necessary  -6/12 tells me pain is improved today  4. Mood:Provide emotional support  6/8- starting Duloxetine 30 mg QHS- since more depressed AND to help with nerve pain.   6/9- mild nausea this AM- will monitor for side effects  6/10- nerve pain better- not resolved -antipsychotic agents: N/A 5. Neuropsych: This patientiscapable of making decisions on herown behalf. 6. Skin/Wound Care:Routine skin checksand turn while in bed every 2 hours while awake- keep on sides at night. 7. Fluids/Electrolytes/Nutrition:Routine in and outs 8.  Neurogenic bowel and bladder.    [place] program. Provide education.  Foley DC'd, increased Flomax, no sense of bladder filling  Continue I/O caths--needs lower volumes  6/11- no voiding on Max dose of Flomax  -having BMs with bowel program, somewhat formed just coming off abx 9. GERD. Protonix 10. Hx of prediabetes confounded by steroid-induced hyperglycemia-   Monitor off steroids 11. Hx of gout-controlled 12. Leukocytosis with UTI:  Bactrim changed to Rocephin, completed course 13. Hyperkalemia  Potassium 5.1 on 6/4, labs ordered for tomorrow  6/7- K+ 5.6- give 10G Lokelma x1 and recheck labs  6/8- K+ down to 4.9 with 10 G of Lokelma  6/10- check labs in AM  6/11- K+ 4.3 14. Thrombocytopenia  6/11- Plts down to 139k- lower but stable  LOS: 23 days A FACE TO FACE EVALUATION WAS PERFORMED  Meredith Staggers 09/23/2019, 9:18 AM

## 2019-09-23 NOTE — Plan of Care (Signed)
  Problem: Consults Goal: RH GENERAL PATIENT EDUCATION Description: See Patient Education module for education specifics. Outcome: Progressing Goal: Skin Care Protocol Initiated - if Braden Score 18 or less Description: N/A Outcome: Progressing   Problem: RH BOWEL ELIMINATION Goal: RH STG MANAGE BOWEL WITH ASSISTANCE Description: STG Manage Bowel with max Assistance. Outcome: Progressing   Problem: RH BLADDER ELIMINATION Goal: RH STG MANAGE BLADDER WITH EQUIPMENT WITH ASSISTANCE Description: STG Manage Bladder With Equipment With Max Assistance Outcome: Progressing   Problem: RH SKIN INTEGRITY Goal: RH STG SKIN FREE OF INFECTION/BREAKDOWN Description: Remain free of breakdown during admission with max assist. Outcome: Progressing Goal: RH STG MAINTAIN SKIN INTEGRITY WITH ASSISTANCE Description: STG Maintain Skin Integrity With Max Assistance. Outcome: Progressing Goal: RH STG ABLE TO PERFORM INCISION/WOUND CARE W/ASSISTANCE Description: STG Able To Perform Incision/Wound Care With total Assistance from caregiver. Outcome: Progressing   Problem: RH SAFETY Goal: RH STG ADHERE TO SAFETY PRECAUTIONS W/ASSISTANCE/DEVICE Description: STG Adhere to Safety Precautions With Mod  Assistance Outcome: Progressing   Problem: RH PAIN MANAGEMENT Goal: RH STG PAIN MANAGED AT OR BELOW PT'S PAIN GOAL Description: Pain goal of 3 on pain scale of 0-10 Outcome: Progressing

## 2019-09-24 ENCOUNTER — Inpatient Hospital Stay (HOSPITAL_COMMUNITY): Payer: Medicare PPO | Admitting: Occupational Therapy

## 2019-09-24 NOTE — Progress Notes (Signed)
[place] PHYSICAL MEDICINE & REHABILITATION PROGRESS NOTE   Subjective/Complaints:  No new complaints. Cleaned her plate this morning. NT helping her with feeding  ROS: Patient denies fever, rash, sore throat, blurred vision, nausea, vomiting, diarrhea, cough, shortness of breath or chest pain, joint or back pain, headache, or mood change. .   Objective:   No results found. Recent Labs    09/22/19 0636  WBC 3.9*  HGB 11.8*  HCT 37.1  PLT 139*   Recent Labs    09/22/19 0636  NA 139  K 4.3  CL 104  CO2 25  GLUCOSE 106*  BUN 17  CREATININE 0.72  CALCIUM 8.4*    Intake/Output Summary (Last 24 hours) at 09/24/2019 0838 Last data filed at 09/24/2019 1610 Gross per 24 hour  Intake 1200 ml  Output 2850 ml  Net -1650 ml     Physical Exam: Vital Signs Blood pressure (!) 109/59, pulse 67, temperature 98 F (36.7 C), resp. rate 18, height 5\' 3"  (1.6 m), weight 76.8 kg, SpO2 98 %. Constitutional: No distress . Vital signs reviewed. HEENT: EOMI, oral membranes moist Neck: supple Cardiovascular: RRR without murmur. No JVD    Respiratory/Chest: CTA Bilaterally without wheezes or rales. Normal effort    GI/Abdomen: BS +, non-tender, non-distended Ext: no clubbing, cyanosis, or edema Psych: pleasant and cooperative Skin: Warm and dry.  Intact. Musc: K-tape bilateral hands.  Neuro: Ox3 Motor RUE: bicep 4/5, WE 3-/5, triceps 4-/5, grip 3+/5, finger abd 1+ to 2/5 LUE- biceps 4/5, WE 2/5, triceps 4-/5, grip 2+/5, finger abd 1/5- slightly improved in UEs Right lower extremity: Hip flexion, knee extension 3/5, ankle dorsiflexion 3+/5 Left lower extremity: Hip flexion, knee extension 2/5, ankle dorsiflexion 2/5  Assessment/Plan: 1. 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  Physiatrist is providing close team supervision and 24 hour management of active  medical problems listed below.  Physiatrist and rehab team continue to assess barriers to discharge/monitor patient progress toward functional and medical goals  Care Tool:  Bathing        Body parts bathed by helper: Right arm, Left arm, Chest, Abdomen, Front perineal area, Buttocks, Right upper leg, Left upper leg, Right lower leg, Left lower leg, Face     Bathing assist Assist Level: Total Assistance - Patient < 25%     Upper Body Dressing/Undressing Upper body dressing   What is the patient wearing?: Pull over shirt    Upper body assist Assist Level: Maximal Assistance - Patient 25 - 49%    Lower Body Dressing/Undressing Lower body dressing      What is the patient wearing?: Pants     Lower body assist Assist for lower body dressing: Total Assistance - Patient < 25%     Toileting Toileting Toileting Activity did not occur (Clothing management and hygiene only):  (pt is incontinent, not safe to sit on a BSC due to poor trunk control)  Toileting assist Assist for toileting: Moderate Assistance - Patient 50 - 74%     Transfers Chair/bed transfer  Transfers assist     Chair/bed transfer assist level: Dependent - mechanical lift     Locomotion Ambulation   Ambulation assist   Ambulation activity did not occur: Safety/medical concerns  Assist level: 2 helpers Assistive device: Lite Gait Max distance: 10   Walk 10 feet activity   Assist  Walk 10 feet activity did not occur:  Safety/medical concerns  Assist level: 2 helpers Assistive device: Lite Gait   Walk 50 feet activity   Assist Walk 50 feet with 2 turns activity did not occur: Safety/medical concerns         Walk 150 feet activity   Assist Walk 150 feet activity did not occur: Safety/medical concerns         Walk 10 feet on uneven surface  activity   Assist Walk 10 feet on uneven surfaces activity did not occur: Safety/medical concerns         Wheelchair     Assist  Will patient use wheelchair at discharge?: Yes Type of Wheelchair:  (to be determined)    Wheelchair assist level: Supervision/Verbal cueing (power wc) Max wheelchair distance: 250    Wheelchair 50 feet with 2 turns activity    Assist        Assist Level: Supervision/Verbal cueing   Wheelchair 150 feet activity     Assist      Assist Level: Supervision/Verbal cueing   Blood pressure (!) 109/59, pulse 67, temperature 98 F (36.7 C), resp. rate 18, height 5\' 3"  (1.6 m), weight 76.8 kg, SpO2 98 %.  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 Status post ACDF 08/28/2019. Decadron taper. Cervical collar as directed. -PRAFOs to prevent Ankle contractures -Decadron taper completed on 6/5  6/8- has slowed recovery -depressing pt more-will start something.   6/10- slightly improved UEs strength since last week- slowing down.  Continue CIR  2. Antithrombotics: -DVT/anticoagulation:SCDs. Lovenox  -antiplatelet therapy: N/A 3. Pain Management:Baclofen 5 mg 3 times daily  oxycodone as needed  added lidoderm patch to R shoulder 9am-9pm  Gabapentin 100 twice daily started on 6/5 for neuropathic pain, improving, continue to titrate as necessary  -6/13 patient reports improvement 4. Mood:Provide emotional support  6/8- starting Duloxetine 30 mg QHS- since more depressed AND to help with nerve pain.   6/9- mild nausea this AM- will monitor for side effects  6/10- nerve pain better- not resolved -antipsychotic agents: N/A 5. Neuropsych: This patientiscapable of making decisions on herown behalf. 6. Skin/Wound Care:Routine skin checksand turn while in bed every 2 hours while awake- keep on sides at night. 7. Fluids/Electrolytes/Nutrition:Routine in and outs 8. Neurogenic bowel and bladder.    [place] program.  Provide education.  Foley DC'd, increased Flomax, no sense of bladder filling  Continue I/O caths--needs lower volumes  6/11- no voiding on Max dose of Flomax  6/13 pt with large bm after dig stim last night 9. GERD. Protonix 10. Hx of prediabetes confounded by steroid-induced hyperglycemia-   Monitor off steroids 11. Hx of gout-controlled 12. Leukocytosis with UTI:  Bactrim changed to Rocephin, completed course 13. Hyperkalemia  Potassium 5.1 on 6/4, labs ordered for tomorrow  6/7- K+ 5.6- give 10G Lokelma x1 and recheck labs  6/8- K+ down to 4.9 with 10 G of Lokelma  6/10- check labs in AM  6/11- K+ 4.3 14. Thrombocytopenia  6/11- Plts down to 139k- lower but stable  LOS: 24 days A FACE TO FACE EVALUATION WAS PERFORMED  Meredith Roach 09/24/2019, 8:38 AM

## 2019-09-24 NOTE — Progress Notes (Signed)
Occupational Therapy Session Note  Patient Details  Name: Meredith Roach MRN: [id number] Date of Birth: 13-Sep-1940  Today's Date: 09/24/2019 OT Individual Time: 1300-1400 OT Individual Time Calculation (min): 60 min    Short Term Goals: Week 3:  OT Short Term Goal 1 (Week 3): Pt will perform 1/3 toileting tasks with max A OT Short Term Goal 2 (Week 3): Pt will perform UB dressing with max A OT Short Term Goal 3 (Week 3): Pt will perform toilet transfer with max A using LRAD OT Short Term Goal 4 (Week 3): Pt will maintain standing balance with mod A during functional activity  Skilled Therapeutic Interventions/Progress Updates:    1:1 Pt dressed and up in the power chair this pm. Pt able to drive to the gym and position self near the mat with supervision. NMR with estim on left hand to promote wrist flexion and extension as well as hand grasp with functional tasks. Also performed throwing small items over hand for more functional use of UEs and strengthening at the shoulder/ scapular. Pt performed scapular stabilization exercises in unsupported sitting with min guard due to loosing balance backwards. Pt also performed slide board transfer to the mat with total A for positioning the board and mod A for holding pt in place and mod A for transfer to mat. Continued to work on opening up trunk for deeper breaths and trunk extension and maintaining. Pt also performed sitting balance activity with min A For dynamic sitting balance. Drove herself back to the room with supervision.  Daughter with questions regarding cathing and bowel program at home and her husband ability to perform. Also discussed transportation options to drs visits with a power chair and [place] recommendations for therapy. Left pt with daughter up in her power chair.   Therapy Documentation Precautions:  Precautions Precautions: Fall, Cervical Precaution Booklet Issued: No Precaution Comments: reviewed cervical precautions Required  Braces or Orthoses: Cervical Brace Cervical Brace: Hard collar Restrictions Weight Bearing Restrictions: No Pain:  no c/o pain  ADL: ADL Eating: Minimal assistance Grooming: Moderate assistance Upper Body Bathing: Maximal assistance (with hand over hand A to hold washcloth) Lower Body Bathing: Dependent Upper Body Dressing: Dependent Where Assessed-Upper Body Dressing: Edge of bed Lower Body Dressing: Dependent Where Assessed-Lower Body Dressing: Bed level Toilet Transfer: Unable to assess Tub/Shower Transfer Method: Unable to assess   Therapy/Group: Individual Therapy  [facility] 09/24/2019, 2:46 PM

## 2019-09-24 NOTE — Plan of Care (Signed)
°  Problem: Consults Goal: RH GENERAL PATIENT EDUCATION Description: See Patient Education module for education specifics. 09/24/2019 0853 by Hillery Jacks, RN Outcome: Progressing 09/24/2019 0853 by Hillery Jacks, RN Outcome: Progressing Goal: Skin Care Protocol Initiated - if Braden Score 18 or less Description: N/A 09/24/2019 0853 by Hillery Jacks, RN Outcome: Progressing 09/24/2019 0853 by Hillery Jacks, RN Outcome: Progressing   Problem: RH BOWEL ELIMINATION Goal: RH STG MANAGE BOWEL WITH ASSISTANCE Description: STG Manage Bowel with max Assistance. 09/24/2019 0853 by Hillery Jacks, RN Outcome: Progressing 09/24/2019 0853 by Hillery Jacks, RN Outcome: Progressing   Problem: RH BLADDER ELIMINATION Goal: RH STG MANAGE BLADDER WITH EQUIPMENT WITH ASSISTANCE Description: STG Manage Bladder With Equipment With Max Assistance 09/24/2019 0853 by Hillery Jacks, RN Outcome: Progressing 09/24/2019 0853 by Hillery Jacks, RN Outcome: Progressing   Problem: RH SKIN INTEGRITY Goal: RH STG SKIN FREE OF INFECTION/BREAKDOWN Description: Remain free of breakdown during admission with max assist. 09/24/2019 0853 by Hillery Jacks, RN Outcome: Progressing 09/24/2019 0853 by Hillery Jacks, RN Outcome: Progressing Goal: RH STG MAINTAIN SKIN INTEGRITY WITH ASSISTANCE Description: STG Maintain Skin Integrity With Max Assistance. Outcome: Progressing Goal: RH STG ABLE TO PERFORM INCISION/WOUND CARE W/ASSISTANCE Description: STG Able To Perform Incision/Wound Care With total Assistance from caregiver. Outcome: Progressing   Problem: RH SAFETY Goal: RH STG ADHERE TO SAFETY PRECAUTIONS W/ASSISTANCE/DEVICE Description: STG Adhere to Safety Precautions With Mod  Assistance Outcome: Progressing   Problem: RH PAIN MANAGEMENT Goal: RH STG PAIN MANAGED AT OR BELOW PT'S PAIN GOAL Description: Pain goal of 3 on pain scale of 0-10 Outcome: Progressing

## 2019-09-24 NOTE — Progress Notes (Signed)
Bowel program started at 1815. Dig stim performed prior to suppository insertion. Medium amount of soft formed stool removed. At 1845 Dig stim performed again. Medium amount of soft stool removed. Rectal vault clear.

## 2019-09-25 ENCOUNTER — Inpatient Hospital Stay (HOSPITAL_COMMUNITY): Payer: Medicare PPO | Admitting: Physical Therapy

## 2019-09-25 ENCOUNTER — Inpatient Hospital Stay (HOSPITAL_COMMUNITY): Payer: Medicare PPO

## 2019-09-25 LAB — CBC WITH DIFFERENTIAL/PLATELET
Abs Immature Granulocytes: 0.05 10*3/uL (ref 0.00–0.07)
Basophils Absolute: 0 10*3/uL (ref 0.0–0.1)
Basophils Relative: 0 %
Eosinophils Absolute: 0.1 10*3/uL (ref 0.0–0.5)
Eosinophils Relative: 2 %
HCT: 39.8 % (ref 36.0–46.0)
Hemoglobin: 13.1 g/dL (ref 12.0–15.0)
Immature Granulocytes: 1 %
Lymphocytes Relative: 20 %
Lymphs Abs: 1.2 10*3/uL (ref 0.7–4.0)
MCH: 29 pg (ref 26.0–34.0)
MCHC: 32.9 g/dL (ref 30.0–36.0)
MCV: 88.2 fL (ref 80.0–100.0)
Monocytes Absolute: 0.4 10*3/uL (ref 0.1–1.0)
Monocytes Relative: 6 %
Neutro Abs: 4.5 10*3/uL (ref 1.7–7.7)
Neutrophils Relative %: 71 %
Platelets: 143 10*3/uL — ABNORMAL LOW (ref 150–400)
RBC: 4.51 MIL/uL (ref 3.87–5.11)
RDW: 13.7 % (ref 11.5–15.5)
WBC: 6.3 10*3/uL (ref 4.0–10.5)
nRBC: 0 % (ref 0.0–0.2)

## 2019-09-25 NOTE — Progress Notes (Signed)
Physical Therapy Session Note  Patient Details  Name: Meredith Roach MRN: [id number] Date of Birth: 18-Jan-1941  Today's Date: 09/25/2019 PT Individual Time: 0800-0915; 1300-1400 PT Individual Time Calculation (min): 75 min and 60 min  Short Term Goals: Week 4:  PT Short Term Goal 1 (Week 4): family will be independent w/hoyer lift transfers bed to/from wc PT Short Term Goal 2 (Week 4): pt will be independent w/PWC operation including propulsion and TIS features PT Short Term Goal 3 (Week 4): gait 62ft in LRAD/LiteGait w/min assist 50% steps for clearance/advancement  Skilled Therapeutic Interventions/Progress Updates:   Session 1: Pt received seated in bed with her husband and daughter present for scheduled wheelchair evaluation. Meredith Roach, ATP from [facility] present for seating evaluation. Pt and her family able to engage in conversation with regards to manual TIS w/c vs power wheelchair. Pt and her family stating that they would prefer TIS due to decreased weight and ease of transportability of this type of chair as well as concerns about a power wheelchair fitting through doorways and living areas of their home. Discussed pros/cons of TIS vs PWC including decreased independence with use of TIS with regards to mobility and pressure relief. Pt and her family understanding of decreased independence level allowed by TIS chair and agreeable that this is the type of chair that they would prefer. Pt is max A for semi-reclined to sitting EOB via logroll for BLE management and trunk elevation. Pt is able to maintain sitting balance EOB x 5 min with close SBA with minimal dependence on UE to maintain balance. Pt exhibits improved trunk control and sitting balance this date. Pt's husband had completed training on manual hoyer but pt's daughter Meredith Roach had not yet completed education, agreeable to perform this session. Sit to supine mod A for BLE management. Rolling L/R with min to mod A for  placement of manual hoyer sling. Reviewed placement of sling and how to maneuver and set up manual lift. Demonstrated how to transfer pt from bed to [place] chair via manual hoyer. Discussed importance of having two people to assist with transfer for safety and to remove sling once pt seated in chair. Pt's husband able to recall education from last week and assist with education of pt's daughter on how to perform transfers with use of manual hoyer. Pt's family report feeling comfortable with use of hospital bed and manual hoyer for transfers. Discussed car transfers and that pt would not currently be safe to perform car transfers, will require use of medical transport until she progresses to a point where she can safely perform these type of transfers with family. Pt is mod A for SB transfer w/c to mat table going to the R. Seated mini-crunches 2 x 10 reps from therapy ball behind pt to fully upright with CGA to min A, focus on eccentric control when leaning posteriorly. Slide board transfer back to w/c with max A to the L due to inability to assist with RUE as well as onset of fatigue. Pt left semi-reclined in TIS w/c in room with needs in reach, family present at end of session.  Session 2: Pt received seated in TIS w/c in room, agreeable to PT session. No complaints of pain. Dependent transport via TIS w/c to/from therapy gym. Slide board transfer w/c to mat table with mod to max A to the R. Assisted pt with donning LiteGait sling while seated EOM. Pt is mod A to stand to LiteGait for sling adjustment. Pt  is then able to ambulate 2 x 10 ft, 1 x 25 ft with one turn while in [place]. Pt exhibits improved ability to advance BLE during gait with increased assist needed to advance LLE with onset of fatigue. Pt also needs multimodal cueing for hip and knee extension in standing especially with onset of fatigue. Standing mini-squats 3 x 10 reps while in [place] with decreased ability to achieve full knee extension with  onset of fatigue. Pt left semi-reclined in TIS w/c in room with needs in reach, daughter present at end of session.   Therapy Documentation Precautions:  Precautions Precautions: Fall, Cervical Precaution Booklet Issued: No Precaution Comments: reviewed cervical precautions Required Braces or Orthoses: Cervical Brace Cervical Brace: Hard collar Restrictions Weight Bearing Restrictions: No    Therapy/Group: Individual Therapy   Excell Seltzer, PT, DPT  09/25/2019, 12:23 PM

## 2019-09-25 NOTE — Progress Notes (Signed)
Occupational Therapy Session Note  Patient Details  Name: Meredith Roach MRN: [id number] Date of Birth: May 08, 1940  Today's Date: 09/25/2019 OT Individual Time: 1100-1200 OT Individual Time Calculation (min): 60 min    Short Term Goals: Week 3:  OT Short Term Goal 1 (Week 3): Pt will perform 1/3 toileting tasks with max A OT Short Term Goal 2 (Week 3): Pt will perform UB dressing with max A OT Short Term Goal 3 (Week 3): Pt will perform toilet transfer with max A using LRAD OT Short Term Goal 4 (Week 3): Pt will maintain standing balance with mod A during functional activity  Skilled Therapeutic Interventions/Progress Updates:    Pt received supine with daughter and husband present. Reviewed previously provided education and provided opportunity for any further questions. They indicated being comfortable with bed level ADLs. Pt agreeable to get OOB with no c/o pain. Pt and family were able to manage manual hoyer and pad with minimal cueing from OT. Also provided quick demo on use of brief at bed level. Pt was transferred into chair via hoyer. Discussed pressure relief positioning. Pt was transported to the therapy gym.  1:1 NMES applied to anterior forearm to activate wrist and finger flexors. Pt guided through functional use and pt/family were educated on use throughout, as well as education provided re NMR.  Ratio 1:3 Rate 35 pps Waveform- Asymmetric Ramp 1.0 Pulse 300 Intensity- 14 Duration -   15 min  Report of pain at the beginning of session 0/10 Report of pain at the end of session 0/10  No adverse reactions after treatment and is skin intact.    Pt was returned to her room and provided with a [facility] handout for HEP. Reviewed with pt and family. Pt left sitting up with all needs met.   Therapy Documentation Precautions:  Precautions Precautions: Fall, Cervical Precaution Booklet Issued: No Precaution Comments: reviewed cervical precautions Required Braces or Orthoses:  Cervical Brace Cervical Brace: Hard collar Restrictions Weight Bearing Restrictions: No   Therapy/Group: Individual Therapy  Curtis Sites 09/25/2019, 6:27 AM

## 2019-09-25 NOTE — Progress Notes (Signed)
Dig stim. Completed at 2040. Medium amount of soft stool emptied. Rectal vault is clear.Pt tolerated well

## 2019-09-25 NOTE — Progress Notes (Signed)
[place] PHYSICAL MEDICINE & REHABILITATION PROGRESS NOTE   Subjective/Complaints:  Pt reports didn't get dig stim for "awhile_ didn't realize it until Saturday night when had great results- nursing figured it out.   Wants to stay longer if possible.   ROS:  Pt denies SOB, abd pain, CP, N/V/C/D, and vision changes   Objective:   No results found. Recent Labs    09/25/19 1206  WBC 6.3  HGB 13.1  HCT 39.8  PLT 143*   No results for input(s): NA, K, CL, CO2, GLUCOSE, BUN, CREATININE, CALCIUM in the last 72 hours.  Intake/Output Summary (Last 24 hours) at 09/25/2019 1531 Last data filed at 09/25/2019 1049 Gross per 24 hour  Intake 600 ml  Output 2050 ml  Net -1450 ml     Physical Exam: Vital Signs Blood pressure (!) 96/54, pulse 72, temperature 98.6 F (37 C), resp. rate 20, height 5\' 3"  (1.6 m), weight 76.8 kg, SpO2 94 %. Constitutional: No distress . Vital signs reviewed.sitting up In bed- husband and daughter at bedside, NAD HEENT: EOMI, oral membranes moist Neck: supple Cardiovascular: RRR    Respiratory/Chest: CTA B/L- no W/R/R- good air movement GI/Abdomen: Soft, NT, ND, (+)BS  Ext: no clubbing, cyanosis, or edema Psych: more sad today Skin: Warm and dry.  Intact. Musc: K-tape bilateral hands.  Neuro: Ox3 Motor RUE: bicep 4/5, WE 3-/5, triceps 4-/5, grip 3+/5, finger abd 1+ to 2/5 LUE- biceps 4/5, WE 2/5, triceps 4-/5, grip 2+/5, finger abd 1/5- slightly improved in UEs Right lower extremity: Hip flexion, knee extension 3/5, ankle dorsiflexion 3+/5 Left lower extremity: Hip flexion, knee extension 2/5, ankle dorsiflexion 2/5  Assessment/Plan: 1. 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  Physiatrist is providing close team supervision and 24 hour management of active medical problems listed below.  Physiatrist and rehab team continue to  assess barriers to discharge/monitor patient progress toward functional and medical goals  Care Tool:  Bathing        Body parts bathed by helper: Right arm, Left arm, Chest, Abdomen, Front perineal area, Buttocks, Right upper leg, Left upper leg, Right lower leg, Left lower leg, Face     Bathing assist Assist Level: Total Assistance - Patient < 25%     Upper Body Dressing/Undressing Upper body dressing   What is the patient wearing?: Pull over shirt    Upper body assist Assist Level: Maximal Assistance - Patient 25 - 49%    Lower Body Dressing/Undressing Lower body dressing      What is the patient wearing?: Pants     Lower body assist Assist for lower body dressing: Total Assistance - Patient < 25%     Toileting Toileting Toileting Activity did not occur (Clothing management and hygiene only):  (pt is incontinent, not safe to sit on a BSC due to poor trunk control)  Toileting assist Assist for toileting: Moderate Assistance - Patient 50 - 74%     Transfers Chair/bed transfer  Transfers assist     Chair/bed transfer assist level: Maximal Assistance - Patient 25 - 49%     Locomotion Ambulation   Ambulation assist   Ambulation activity did not occur: Safety/medical concerns  Assist level: 2 helpers Assistive device: Lite Gait Max distance: 10   Walk 10 feet activity   Assist  Walk 10 feet activity did not occur: Safety/medical concerns  Assist level: 2 helpers Assistive device:  Lite Gait   Walk 50 feet activity   Assist Walk 50 feet with 2 turns activity did not occur: Safety/medical concerns         Walk 150 feet activity   Assist Walk 150 feet activity did not occur: Safety/medical concerns         Walk 10 feet on uneven surface  activity   Assist Walk 10 feet on uneven surfaces activity did not occur: Safety/medical concerns         Wheelchair     Assist Will patient use wheelchair at discharge?: Yes Type of  Wheelchair:  (to be determined)    Wheelchair assist level: Supervision/Verbal cueing (power wc) Max wheelchair distance: 250    Wheelchair 50 feet with 2 turns activity    Assist        Assist Level: Supervision/Verbal cueing   Wheelchair 150 feet activity     Assist      Assist Level: Supervision/Verbal cueing   Blood pressure (!) 96/54, pulse 72, temperature 98.6 F (37 C), resp. rate 20, height 5\' 3"  (1.6 m), weight 76.8 kg, SpO2 94 %.  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 Status post ACDF 08/28/2019. Decadron taper. Cervical collar as directed. -PRAFOs to prevent Ankle contractures -Decadron taper completed on 6/5  6/8- has slowed recovery -depressing pt more-will start something.   6/10- slightly improved UEs strength since last week- slowing down.  Continue CIR  2. Antithrombotics: -DVT/anticoagulation:SCDs. Lovenox  -antiplatelet therapy: N/A 3. Pain Management:Baclofen 5 mg 3 times daily  oxycodone as needed  added lidoderm patch to R shoulder 9am-9pm  Gabapentin 100 twice daily started on 6/5 for neuropathic pain, improving, continue to titrate as necessary  -6/13 patient reports improvement 4. Mood:Provide emotional support  6/8- starting Duloxetine 30 mg QHS- since more depressed AND to help with nerve pain.   6/9- mild nausea this AM- will monitor for side effects  6/10- nerve pain better- not resolved -antipsychotic agents: N/A 5. Neuropsych: This patientiscapable of making decisions on herown behalf. 6. Skin/Wound Care:Routine skin checksand turn while in bed every 2 hours while awake- keep on sides at night. 7. Fluids/Electrolytes/Nutrition:Routine in and outs 8. Neurogenic bowel and bladder.    [facility] program. Provide education.  Foley DC'd, increased Flomax, no  sense of bladder filling  Continue I/O caths--needs lower volumes  6/11- no voiding on Max dose of Flomax  6/13 pt with large bm after dig stim last night  6/14- WILL NEED TO CON'T DIG STIM- also will place foley the day of d/c- can't go home without one 9. GERD. Protonix 10. Hx of prediabetes confounded by steroid-induced hyperglycemia-   Monitor off steroids 11. Hx of gout-controlled 12. Leukocytosis with UTI:  Bactrim changed to Rocephin, completed course 13. Hyperkalemia  Potassium 5.1 on 6/4, labs ordered for tomorrow  6/7- K+ 5.6- give 10G Lokelma x1 and recheck labs  6/8- K+ down to 4.9 with 10 G of Lokelma  6/10- check labs in AM  6/11- K+ 4.3  6/14- labs in AM 14. Thrombocytopenia  6/11- Plts down to 139k- lower but stable  6/14- Plts 143- slightly improved  -will need f/u after d/c.    LOS: 25 days A FACE TO FACE EVALUATION WAS PERFORMED  Meredith Roach 09/25/2019, 3:31 PM

## 2019-09-25 NOTE — Progress Notes (Signed)
Pt In/out cath performed, 900 ml collected. Pt had Lg BM, rectal vault clear.pt tolerated procedure well, no signs of distress noted at this time.

## 2019-09-25 NOTE — Progress Notes (Signed)
Patient ID: Meredith Roach, female   DOB: 02-01-1941, 79 y.o.   MRN: [id number]  SW returned phone call to pt dtr [name] [phone]) whom had questions about d/c process. SW explained d/c process with regard to DME and [facility] services. SW informed will follow-up after team conference with more updates.   Loralee Pacas, MSW, [place] Office: [phone] Cell: [phone] Fax: [phone]

## 2019-09-26 ENCOUNTER — Inpatient Hospital Stay (HOSPITAL_COMMUNITY): Payer: Medicare PPO

## 2019-09-26 ENCOUNTER — Inpatient Hospital Stay (HOSPITAL_COMMUNITY): Payer: Medicare PPO | Admitting: Physical Therapy

## 2019-09-26 LAB — BASIC METABOLIC PANEL
Anion gap: 9 (ref 5–15)
BUN: 10 mg/dL (ref 8–23)
CO2: 26 mmol/L (ref 22–32)
Calcium: 8.4 mg/dL — ABNORMAL LOW (ref 8.9–10.3)
Chloride: 105 mmol/L (ref 98–111)
Creatinine, Ser: 0.6 mg/dL (ref 0.44–1.00)
GFR calc Af Amer: >60 mL/min (ref >60)
GFR calc non Af Amer: >60 mL/min (ref >60)
Glucose, Bld: 99 mg/dL (ref 70–99)
Potassium: 4.1 mmol/L (ref 3.5–5.1)
Sodium: 140 mmol/L (ref 135–145)

## 2019-09-26 NOTE — Progress Notes (Signed)
Occupational Therapy Weekly Progress Note  Patient Details  Name: Meredith Roach MRN: [id number] Date of Birth: Jun 05, 1940  Beginning of progress report period: September 18, 2019 End of progress report period: September 26, 2019  Patient has met 1 of 4 short term goals.  Pt functional progress during the past week has been minimal. LTG downgraded secondary to lack of progress.  Pt's husband and daughter have been present and participated in therapy sessions. Pt's husband and daughter have practiced Harrel Lemon transfers and require min verbal cues for technique/safety. Pt's daughter and husband will be assisting pt with bathing/dressing tasks at bed level. Pt's daughter/husband are independent with changing  [place] pads.  Pt able to self feeding and wash face/brush teeth with setup.  Pt requires max A for UB dressing tasks.    Patient continues to demonstrate the following deficits: muscle weakness, muscle joint tightness and muscle paralysis, decreased cardiorespiratoy endurance, impaired timing and sequencing, abnormal tone, unbalanced muscle activation, decreased coordination and decreased motor planning and decreased sitting balance, decreased standing balance and decreased postural control and therefore will continue to benefit from skilled OT intervention to enhance overall performance with BADL and Reduce care partner burden.  Patient not progressing toward long term goals.  See goal revision..  Plan of care revisions: Several goals downgraded to max A and several transfers d/c to reflect family's hoyer use at home.  OT Short Term Goals Week 3:  OT Short Term Goal 1 (Week 3): Pt will perform 1/3 toileting tasks with max A OT Short Term Goal 1 - Progress (Week 3): Met OT Short Term Goal 2 (Week 3): Pt will perform UB dressing with max A OT Short Term Goal 2 - Progress (Week 3): Discontinued (comment) OT Short Term Goal 3 (Week 3): Pt will perform toilet transfer with max A using LRAD OT Short Term Goal  3 - Progress (Week 3): Discontinued (comment) OT Short Term Goal 4 (Week 3): Pt will maintain standing balance with mod A during functional activity OT Short Term Goal 4 - Progress (Week 3): Discontinued (comment) Week 4:  OT Short Term Goal 1 (Week 4): STG=LTG secondary to ELOS (LTG downgraded 2/2 lack of progress)    Leroy Libman 09/26/2019, 6:35 AM

## 2019-09-26 NOTE — Plan of Care (Signed)
OT POC updated. Transfer goals discontinued to reflect hoyer use at home. ADL goals downgraded to max A overall.   Problem: RH Balance Goal: LTG: Patient will maintain dynamic sitting balance (OT) Description: LTG:  Patient will maintain dynamic sitting balance with assistance during activities of daily living (OT) Flowsheets (Taken 09/26/2019 1255) LTG: Pt will maintain dynamic sitting balance during ADLs with: Minimal Assistance - Patient > 75% Goal: LTG Patient will maintain dynamic standing with ADLs (OT) Description: LTG:  Patient will maintain dynamic standing balance with assist during activities of daily living (OT)  Outcome: Not Applicable   Problem: RH Bathing Goal: LTG Patient will bathe all body parts with assist levels (OT) Description: LTG: Patient will bathe all body parts with assist levels (OT) Flowsheets (Taken 09/26/2019 1255) LTG: Pt will perform bathing with assistance level/cueing: (downgraded 6/15- SD) Maximal Assistance - Patient 25 - 49%   Problem: Sit to Stand Goal: LTG:  Patient will perform sit to stand in prep for activites of daily living with assistance level (OT) Description: LTG:  Patient will perform sit to stand in prep for activites of daily living with assistance level (OT) Outcome: Not Applicable   Problem: RH Dressing Goal: LTG Patient will perform upper body dressing (OT) Description: LTG Patient will perform upper body dressing with assist, with/without cues (OT). Flowsheets (Taken 09/26/2019 1255) LTG: Pt will perform upper body dressing with assistance level of: (downgraded 6/15- SD) Maximal Assistance - Patient 25 - 49% Goal: LTG Patient will perform lower body dressing w/assist (OT) Description: LTG: Patient will perform lower body dressing with assist, with/without cues in positioning using equipment (OT) Outcome: Not Applicable   Problem: RH Toileting Goal: LTG Patient will perform toileting task (3/3 steps) with assistance level  (OT) Description: LTG: Patient will perform toileting task (3/3 steps) with assistance level (OT)  Outcome: Not Applicable   Problem: RH Toilet Transfers Goal: LTG Patient will perform toilet transfers w/assist (OT) Description: LTG: Patient will perform toilet transfers with assist, with/without cues using equipment (OT) Outcome: Not Applicable   Problem: RH Tub/Shower Transfers Goal: LTG Patient will perform tub/shower transfers w/assist (OT) Description: LTG: Patient will perform tub/shower transfers with assist, with/without cues using equipment (OT) Outcome: Not Applicable

## 2019-09-26 NOTE — Progress Notes (Signed)
Occupational Therapy Session Note  Patient Details  Name: Meredith Roach MRN: [id number] Date of Birth: 1940-07-15  Today's Date: 09/26/2019 OT Individual Time: 1300-1410 OT Individual Time Calculation (min): 70 min    Short Term Goals: Week 4:  OT Short Term Goal 1 (Week 4): STG=LTG secondary to ELOS (LTG downgraded 2/2 lack of progress)  Skilled Therapeutic Interventions/Progress Updates:    Pt resting in recliner upon arrival.  Husband present.  Pt and husband stated that the plan is to discharge home on 6/18.  Pt stated that daughters have been able to recruit family members and friends to assist with care of patient at home.  OT intervention with focus on BUE functional use.  Pt engaged in table tasks  Including activities with theraputty to increase grasp strength and function in BUE/hands. Pt able to roll putty into ball and snake and using thumb and index finger pinch the putty.  Pt accomplishes with both tasks R>L. Pt also small and large beads from putty and replaced in putty.  Pt used LUE/hand to grasp large pegs and place/remove from peg board. Pt noted with improved LUE/hand grasp to assist RUE during functional tasks.  Pt returned to room and remained in TIS with soft call bell secured to pt. All needs within reach. Seat belt secure.  Therapy Documentation Precautions:  Precautions Precautions: Fall, Cervical Precaution Booklet Issued: No Precaution Comments: reviewed cervical precautions Required Braces or Orthoses: Cervical Brace Cervical Brace: Hard collar Restrictions Weight Bearing Restrictions: No   Pain:  Pt denies pain this afternoon   Therapy/Group: Individual Therapy  Leroy Libman 09/26/2019, 2:32 PM

## 2019-09-26 NOTE — Patient Care Conference (Signed)
Inpatient RehabilitationTeam Conference and Plan of Care Update Date: 09/26/2019   Time: 4:00 PM    Patient Name: Meredith Roach Record Number: [id number]  Date of Birth: 12-27-1940 Sex: Female         Room/Bed: 4M11C/4M11C-01 Payor Info: Payor: HU[employer] MEDICARE / Plan: [employer] MEDICARE CHOICE PPO / Product Type: *No Product type* /    Admit Date/Time:  08/31/2019  5:23 PM  Primary Diagnosis:  Quadriplegia [facility])  Patient Active Problem List   Diagnosis Date Noted   Hyperkalemia    Steroid-induced hyperglycemia    Prediabetes    Neurogenic bowel    Neurogenic bladder    Neuropathic pain    Quadriplegia ([place]) 08/31/2019   S/P cervical spinal fusion 08/28/2019   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 Leeroy Cha Care Coodinator Present: Meredith Roach, LCSWA;Other (comment) Meredith Nettles, RN, BSN, CRRN) Nurse Present: Meredith Gale, RN PT Present: Meredith Roach, PT OT Present: Meredith Roach, OT;Meredith Roach, COTA PPS Coordinator present : Meredith Roach, PT     Current Status/Progress Goal Weekly Team Focus  Bowel/Bladder   Pt requires Q6h bladder scan with straight cath. Pt on bowel program, LBM 09/25/19  Patient will return to normal bowel and bladder patterns and continence  Assess B/B function A shift/PRN.   Swallow/Nutrition/ Hydration             ADL's   bathing/dressing at bed level-max/tot A; funcitonal tranfsers with [place] and hoyer; sit<>stand in [place]-mod/max A; self feeding-CGA, sitting balance-min A  bathing-mod A; UB dressing-mod A; LB dressing-max A; self feeding-supervision/setup; toilet tranfsers-mod A; toileting-max A  BUE NMR, sitting balance, educaiton, ADL retraining, activity tolerance   Mobility   mod to max A bed mobility, SB mod to max A vs  transfer with stedy or manual hoyer with family, Supervison PWC mobility x 250'  mod to max A overall and dependent for transfers via lift  family edu, d/c planning, LE NMR and gait training in [employer]   Communication             Safety/Cognition/ Behavioral Observations            Pain   Pt is pain free. may have tingling and numbness of the bilateral hands. PRN medications available if needed.  Patient will continue to have minimal pain with level less than 3  Assess pain qshift/PRN   Skin   Pt has surgical incision to the R side of the neck, steri strips in place. Pts buttocks has foam for breakdown prevention.  Patient will remain free of skin breakdown and infection  Assess skin qshift/PRN, foam and barrier cream applied to buttocks    Rehab Goals Patient on target to meet rehab goals: Yes *See Care Plan and progress notes for long and short-term goals.     Barriers to Discharge  Current Status/Progress Possible Resolutions Date Resolved   Nursing                  PT  Neurogenic Bowel & Bladder                 OT                  SLP  Care Coordinator                Discharge Planning/Teaching Needs:  D/c to home with her husband who will provide 24/7 care. Pt reports their daughters will rotate their time. Dtr Meredith Roach intends to stay once school has ended.  Family education as recommended   Team Discussion:  Nursing to begin Lovenox injection education with family. Pt going home with foley in place, nursing to educate family on foley care and bowel program. Total/dependent assist for lower body dressing. Family to do bed level bathing and dressing. Max/total assist slideboard transfers. Family education continues with [employer].  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  Status post ACDF  08/28/2019. Weekly Focus/Goal: continued bowel program, foley for neurogenic bladder, continued PRAFO boots to prevent ankle contracture, continued pain regimen  Barriers to Discharge: Medical stability;Home enviroment access/layout;Neurogenic Bowel & Bladder  Barriers to Discharge Comments: neurogenic bowel and bladder requiring bowel program and foley, neuropathic and right shoulder pain, depression Possible Resolutions to Barriers: Patient and family traning regarding bowel program and foley care, continued pain regimen, continued Cymbalta   Continued Need for Acute Rehabilitation Level of Care: The patient requires daily medical management by a physician with specialized training in physical medicine and rehabilitation for the following reasons: Direction of a multidisciplinary physical rehabilitation program to maximize functional independence : Yes Medical management of patient stability for increased activity during participation in an intensive rehabilitation regime.: Yes Analysis of laboratory values and/or radiology reports with any subsequent need for medication adjustment and/or medical intervention. : Yes   I attest that I was present, lead the team conference, and concur with the assessment and plan of the team.   Meredith Roach 09/26/2019, 4:00 PM

## 2019-09-26 NOTE — Progress Notes (Signed)
Patient ID: Meredith Roach, female   DOB: December 28, 1940, 79 y.o.   MRN: [id number]   SW met with pt and pt husband to provide updates from team conference, and discuss plan of care for discharge. Plan is for pt to d/c to home. Discussed transition to home and care needs that will be put in place. SW indicated will provide updates on DME that is recommended for d/c.  *SW left HHA list in pt room for review, and SW will follow-up.   SW returned phone call to pt dtr Meredith Roach [phone]) who reported possible change in d/c location to her home. They will discuss further as a family on best plan of care, and will follow-up with SW tomorrow. SW stressed importance of knowing location in efforts to order DME.  Loralee Pacas, MSW, [place] Office: [phone] Cell: [phone] Fax: [phone]

## 2019-09-26 NOTE — Progress Notes (Addendum)
Occupational Therapy Session Note  Patient Details  Name: Meredith Roach MRN: [id number] Date of Birth: Nov 10, 1940  Today's Date: 09/26/2019 OT Individual Time: 0700-0800 OT Individual Time Calculation (min): 60 min    Short Term Goals: Week 4:  OT Short Term Goal 1 (Week 4): STG=LTG secondary to ELOS (LTG downgraded 2/2 lack of progress)  Skilled Therapeutic Interventions/Progress Updates:    Pt resting in bed upon arrival and agreeable to therapy.  OT intervention with focus on bed mobility, sitting balance, sit<>stand in [place], self feeding, grooming, and activity tolerance to increase independence with BADLs.  Pt able to move BLE to edge of bed without assistance.  Static sitting balance with S and dynamic sitting balance with min A.  Pt practiced lateral leans with max A.  Pt will require wide drop arm BA to facilitate lateral leans for toilet hygiene. Supine>sit EOB with max A. Sit<>stand from EOB with mod A in [place]. Pt engaged in self feeding tasks with adapted utensils. Pt able to pick up strips of bacon and toast without assistance.  Pt able to grasp tea and milk container and bring to mouth without assistance. Pt completed grooming tasks seated in w/c at sink without assistance after setup. Pt remained in w/c with all needs within reach and seat belt secure.   Therapy Documentation Precautions:  Precautions Precautions: Fall, Cervical Precaution Booklet Issued: No Precaution Comments: reviewed cervical precautions Required Braces or Orthoses: Cervical Brace Cervical Brace: Hard collar Restrictions Weight Bearing Restrictions: No  Pain:  Pt denies pain this monring   Therapy/Group: Individual Therapy  Leroy Libman 09/26/2019, 8:04 AM

## 2019-09-26 NOTE — Progress Notes (Signed)
Physical Therapy Session Note  Patient Details  Name: Meredith Roach MRN: [id number] Date of Birth: Aug 19, 1940  Today's Date: 09/26/2019 PT Individual Time: 1015-1100; 1130-1200 PT Individual Time Calculation (min): 45 min; 30 min   Short Term Goals: Week 4:  PT Short Term Goal 1 (Week 4): family will be independent w/hoyer lift transfers bed to/from wc PT Short Term Goal 2 (Week 4): pt will be independent w/PWC operation including propulsion and TIS features PT Short Term Goal 3 (Week 4): gait 26ft in LRAD/LiteGait w/min assist 50% steps for clearance/advancement  Skilled Therapeutic Interventions/Progress Updates:    Session 1: Pt received seated in w/c in room, agreeable to PT session. No complaints of pain. Dependent transport via TIS w/c to/from therapy gym. Discussion with pt again with regards to [facility] mobility vs use of TIS and independence level. Pt remains adamant that a PWC would not be able to be safely navigated in her home and she and her family prefer to use a manual TIS chair. Pt also reports uncertainty with regards to upcoming d/c and expresses her wish to purse SNF options as she is fearful her family may not be able to care for her. Pt also discusses this with her husband and he in agreement. Updated CSW and team during conference of pt's concerns. Slide board transfer w/c to mat table with max A to the R, cueing for sequencing and anterior leaning. Slide board transfer mat table to/from standard w/c with max to total A. Pt exhibits decreased ability to assist with SB transfers this date. Seated R/L lateral leans, 2 x 5 each direction with min to mod A to return to midline. Slide board transfer back to TIS w/c with 4" curb step under BLE with mod A. Pt exhibits improved ability to assist with transfer and increased safety with use of curb step under LE. Pt left semi-reclined in bed with needs in reach, husband present at end of session.  Session 2: Pt received seated in TIS w/c  in room, agreeable to PT session. No complaints of pain. Dependent transport via TIS w/c to/from therapy gym. Slide board transfer w/c to mat table with mod A with increased time and cueing needed. Seated balance EOM with close SBA throughout session. Seated BLE ball kicks x 30 reps each to fatigue. Seated ball toss with use of BUE to return ball to therapist, 2 x 30 reps to fatigue. Slide board transfer back to w/c with mod A with 4" curb under BLE. Slide board transfer back to bed with mod A going downhill. Sit to supine mod A for BLE management. Pt left semi-reclined in bed with needs in reach, husband present at end of session.   Therapy Documentation Precautions:  Precautions Precautions: Fall, Cervical Precaution Booklet Issued: No Precaution Comments: reviewed cervical precautions Required Braces or Orthoses: Cervical Brace Cervical Brace: Hard collar Restrictions Weight Bearing Restrictions: No    Therapy/Group: Individual Therapy   Excell Seltzer, PT, DPT  09/26/2019, 4:14 PM

## 2019-09-26 NOTE — Progress Notes (Signed)
Bowel program started at [place]. Dig stim done resulting in medium amount of semi formed stool prior to suppository insertion. At 1930 dig stim performed again resulting in small amount of semi formed stool. Due to "mushy" consistency of stool unable to fully clear rectal vault. Continuing in/out caths q6h. Last cath at [place].

## 2019-09-26 NOTE — Progress Notes (Signed)
[place] PHYSICAL MEDICINE & REHABILITATION PROGRESS NOTE   Subjective/Complaints: Had dig stim yesterday and was able to have a BM. Still being cathed every 6 hours and will be going home with a foley. Continues to have numbness and tingling to hands but is tolerating this well. Mod to MaxA bed mobility.   ROS:  Pt denies SOB, abd pain, CP, N/V/C/D, and vision changes  Objective:   No results found. Recent Labs    09/25/19 1206  WBC 6.3  HGB 13.1  HCT 39.8  PLT 143*   Recent Labs    09/26/19 0714  NA 140  K 4.1  CL 105  CO2 26  GLUCOSE 99  BUN 10  CREATININE 0.60  CALCIUM 8.4*    Intake/Output Summary (Last 24 hours) at 09/26/2019 1109 Last data filed at 09/26/2019 0843 Gross per 24 hour  Intake 1277 ml  Output 1650 ml  Net -373 ml     Physical Exam: Vital Signs Blood pressure (!) 103/47, pulse 72, temperature 98.2 F (36.8 C), temperature source Oral, resp. rate 18, height 5\' 3"  (1.6 m), weight 76.8 kg, SpO2 100 %. Constitutional: No distress . Vital signs reviewed.sitting up In bed- husband and daughter at bedside, NAD HEENT: EOMI, oral membranes moist Neck: supple Cardiovascular: RRR    Respiratory/Chest: CTA B/L- no W/R/R- good air movement GI/Abdomen: Soft, NT, ND, (+)BS  Ext: no clubbing, cyanosis, or edema Psych: more sad today Skin: Warm and dry.  Intact. Musc: K-tape bilateral hands.  Neuro: Ox3 Motor RUE: bicep 4/5, WE 3-/5, triceps 4-/5, grip 3+/5, finger abd 1+ to 2/5 LUE- biceps 4/5, WE 2/5, triceps 4-/5, grip 2+/5, finger abd 1/5- slightly improved in UEs Right lower extremity: Hip flexion, knee extension 3/5, ankle dorsiflexion 3+/5 Left lower extremity: Hip flexion, knee extension 2/5, ankle dorsiflexion 2/5 Functional mobility: Mod to Max A bed mobility  Assessment/Plan: 1. Functional deficits secondary to C3 ASIA C quadriplegia which require 3+ hours per day of interdisciplinary therapy in a comprehensive inpatient rehab  setting.  Physiatrist is providing close team supervision and 24 hour management of active medical problems listed below.  Physiatrist and rehab team continue to assess barriers to discharge/monitor patient progress toward functional and medical goals  Care Tool:  Bathing        Body parts bathed by helper: Right arm, Left arm, Chest, Abdomen, Front perineal area, Buttocks, Right upper leg, Left upper leg, Right lower leg, Left lower leg, Face     Bathing assist Assist Level: Total Assistance - Patient < 25%     Upper Body Dressing/Undressing Upper body dressing   What is the patient wearing?: Pull over shirt    Upper body assist Assist Level: Maximal Assistance - Patient 25 - 49%    Lower Body Dressing/Undressing Lower body dressing      What is the patient wearing?: Pants     Lower body assist Assist for lower body dressing: Total Assistance - Patient < 25%     Toileting Toileting Toileting Activity did not occur (Clothing management and hygiene only):  (pt is incontinent, not safe to sit on a BSC due to poor trunk control)  Toileting assist Assist for toileting: Moderate Assistance - Patient 50 - 74%     Transfers Chair/bed transfer  Transfers assist     Chair/bed transfer assist level: Maximal Assistance - Patient 25 - 49%     Locomotion Ambulation   Ambulation assist   Ambulation activity did not occur: Safety/medical concerns  Assist level: 2 helpers Assistive device: Lite Gait Max distance: 10   Walk 10 feet activity   Assist  Walk 10 feet activity did not occur: Safety/medical concerns  Assist level: 2 helpers Assistive device: Lite Gait   Walk 50 feet activity   Assist Walk 50 feet with 2 turns activity did not occur: Safety/medical concerns         Walk 150 feet activity   Assist Walk 150 feet activity did not occur: Safety/medical concerns         Walk 10 feet on uneven surface  activity   Assist Walk 10 feet on  uneven surfaces activity did not occur: Safety/medical concerns         Wheelchair     Assist Will patient use wheelchair at discharge?: Yes Type of Wheelchair:  (to be determined)    Wheelchair assist level: Supervision/Verbal cueing (power wc) Max wheelchair distance: 250    Wheelchair 50 feet with 2 turns activity    Assist        Assist Level: Supervision/Verbal cueing   Wheelchair 150 feet activity     Assist      Assist Level: Supervision/Verbal cueing   Blood pressure (!) 103/47, pulse 72, temperature 98.2 F (36.8 C), temperature source Oral, resp. rate 18, height 5\' 3"  (1.6 m), weight 76.8 kg, SpO2 100 %.  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 Status post ACDF 08/28/2019. Decadron taper. Cervical collar as directed. -PRAFOs to prevent Ankle contractures -Decadron taper completed on 6/5  6/8- has slowed recovery -depressing pt more-will start something.   6/10- slightly improved UEs strength since last week- slowing down.  Continue CIR   Team conference 6/15.  2. Antithrombotics: -DVT/anticoagulation:SCDs. Lovenox  -antiplatelet therapy: N/A 3. Pain Management:Baclofen 5 mg 3 times daily  oxycodone as needed  added lidoderm patch to R shoulder 9am-9pm  Gabapentin 100 twice daily started on 6/5 for neuropathic pain, improving, continue to titrate as necessary  -6/15 well controlled 4. Mood:Provide emotional support  6/8- starting Duloxetine 30 mg QHS- since more depressed AND to help with nerve pain.   6/9- mild nausea this AM- will monitor for side effects  6/10- nerve pain better- not resolved -antipsychotic agents: N/A 5. Neuropsych: This patientiscapable of making decisions on herown behalf. 6. Skin/Wound Care:Routine skin checksand turn while in bed every 2 hours while  awake- keep on sides at night. 7. Fluids/Electrolytes/Nutrition:Routine in and outs 8. Neurogenic bowel and bladder.    [place] program. Provide education.  Foley DC'd, increased Flomax, no sense of bladder filling  Continue I/O caths--needs lower volumes  6/11- no voiding on Max dose of Flomax  6/13 pt with large bm after dig stim last night  6/14- WILL NEED TO CON'T DIG STIM- also will place foley the day of d/c- can't go home without one  5/15: Had BM with dig stim last night 9. GERD. Protonix 10. Hx of prediabetes confounded by steroid-induced hyperglycemia-   Monitor off steroids 11. Hx of gout-controlled 12. Leukocytosis with UTI:  Bactrim changed to Rocephin, completed course 13. Hyperkalemia  Potassium 5.1 on 6/4, labs ordered for tomorrow  6/7- K+ 5.6- give 10G Lokelma x1 and recheck labs  6/8- K+ down to 4.9 with 10 G of Lokelma  6/10- check labs in AM  6/11- K+ 4.3  6/15: K+ is 4.1 14. Thrombocytopenia  6/11- Plts down to 139k- lower but stable  6/14-  Plts 143- slightly improved  -will need f/u after d/c.  15. Disposition: DC on 6/18   LOS: 26 days A FACE TO FACE EVALUATION WAS PERFORMED  Asante Ritacco P Celester Lech 09/26/2019, 11:09 AM

## 2019-09-27 ENCOUNTER — Inpatient Hospital Stay (HOSPITAL_COMMUNITY): Payer: Medicare PPO

## 2019-09-27 ENCOUNTER — Inpatient Hospital Stay (HOSPITAL_COMMUNITY): Payer: Medicare PPO | Admitting: Physical Therapy

## 2019-09-27 LAB — GLUCOSE, CAPILLARY: Glucose-Capillary: 88 mg/dL (ref 70–99)

## 2019-09-27 NOTE — Progress Notes (Addendum)
Patient ID: Meredith Roach, female   DOB: June 11, 1940, 79 y.o.   MRN: [id number]  Sw met with pt in room to discuss d/c plan. Pt will d/c to her daughter Kim's home: [place], [place], [place] [address]. Preferred H[facility] is [facility] [place].   SW spoke with Donna/[facility] [phone]) to discuss HHPT/OT/CNA who will speak with Kathe Mariner with regard to referral. SW waiting on follow-up.  *Declined due to nursing.  SW waiting on updates from [facility] at [facility] [phone]) about referral.   Sw spoke with Jason/Sarah with [facility] [phone]) to inform on d/c location and additional DME needed: hospital bed, 30in slide board, and drop arm bedside commode (unable to get wide drop arm bedside due to not meeting weight requirements). *SW spoke with pt dtr [name] [phone]) to update on DME. States they have a DABSC and did not need to order.   *HH referral accepted for PT/OT/CNA with [facility].   Loralee Pacas, MSW, [place] Office: [phone] Cell: [phone] Fax: [phone]

## 2019-09-27 NOTE — Progress Notes (Signed)
Occupational Therapy Session Note  Patient Details  Name: Meredith Roach MRN: [id number] Date of Birth: 18-Jun-1940  Today's Date: 09/27/2019 OT Individual Time: 1430-1510 OT Individual Time Calculation (min): 40 min    Short Term Goals: Week 4:  OT Short Term Goal 1 (Week 4): STG=LTG secondary to ELOS (LTG downgraded 2/2 lack of progress)  Skilled Therapeutic Interventions/Progress Updates:    Pt received sitting up in the TIS w/c with no c/o pain, agreeable to session. Pt was taken down to the ADL apt. Pt completed functional reaching, crossing midline with R/L UE to grasp and release boxes of food (empty, no weight). Pt required min facilitation at the elbow for the LUE to complete ~50 degrees of scaption. Pt required rest break following each of the two trials per side. Pt then completed forward reaching with trunk control component, reaching bimanually for boxes and then rotating at her trunk to place on counter. Pt was returned to her room and left sitting up with all needs met, soft call bell within reach.   Therapy Documentation Precautions:  Precautions Precautions: Fall, Cervical Precaution Booklet Issued: No Precaution Comments: reviewed cervical precautions Required Braces or Orthoses: Cervical Brace Cervical Brace: Hard collar Restrictions Weight Bearing Restrictions: No   Therapy/Group: Individual Therapy  Curtis Sites 09/27/2019, 6:46 AM

## 2019-09-27 NOTE — Progress Notes (Signed)
Patient refused her suppository and digital stimulation because she stated that she had two bowel movements today already and thinks that she's ok. Patient was up with the steady lift to the commode.

## 2019-09-27 NOTE — Progress Notes (Signed)
Occupational Therapy Session Note  Patient Details  Name: Meredith Roach MRN: [id number] Date of Birth: 25-Feb-1941  Today's Date: 09/27/2019 OT Individual Time: 0700-0800 OT Individual Time Calculation (min): 60 min    Short Term Goals: Week 4:  OT Short Term Goal 1 (Week 4): STG=LTG secondary to ELOS (LTG downgraded 2/2 lack of progress)  Skilled Therapeutic Interventions/Progress Updates:    Pt resting in bed upon arrival and ready to get OOB for breakfast and grooming at sink.  Rolling in bed with min A to L and mod A to R using bed rails.  Supine>sit EOB with max A. Sitting balance with supervision.  Transfer to w/c with Stedy.  Sit<>stand from EOB with mod A and min A from paddles on Stedy. Self feeding with setup this morning.  Pt able to grasp drink containers with RUE/hand and bring to mouth without assistance. Pt completed grooming at sink including brushing hair, brushing teeth, and washing face after setup. Pt issued handout for theraputty exercises that pt has been performing with tan theraputty. Pt remained in TIS with all needs within reach and seat belt secured.   Therapy Documentation Precautions:  Precautions Precautions: Fall, Cervical Precaution Booklet Issued: No Precaution Comments: reviewed cervical precautions Required Braces or Orthoses: Cervical Brace Cervical Brace: Hard collar Restrictions Weight Bearing Restrictions: No Pain:  Pt denies pain this morning   Therapy/Group: Individual Therapy  Leroy Libman 09/27/2019, 8:02 AM

## 2019-09-27 NOTE — Progress Notes (Signed)
Occupational Therapy Session Note  Patient Details  Name: Meredith Roach MRN: [id number] Date of Birth: November 20, 1940  Today's Date: 09/27/2019 OT Individual Time: 1130-1200 OT Individual Time Calculation (min): 30 min    Short Term Goals: Week 1:  OT Short Term Goal 1 (Week 1): Pt will be able to maintain static sit EOB with close S to engage more actively in UB self care. OT Short Term Goal 1 - Progress (Week 1): Met OT Short Term Goal 2 (Week 1): Pt will demonstrate improved R hand function to hold wash cloth in hand to wash thighs. OT Short Term Goal 2 - Progress (Week 1): Met OT Short Term Goal 3 (Week 1): Pt will be able to actively use hands to pull sleeves over arms. OT Short Term Goal 3 - Progress (Week 1): Progressing toward goal OT Short Term Goal 4 (Week 1): Pt will be able to rise to stand in [place] lift with mod A to work towards completing [facility] transfers. OT Short Term Goal 4 - Progress (Week 1): Met  Skilled Therapeutic Interventions/Progress Updates:    1:1. Pt received in TIS with RN applying pain patch to R shoulder. Pt agreeable to OT session. After discussion of needs before d/c home, it comes up family doesn't have shoulder/elbow stretching routine. medbridge PROM handout provided as follows with demo/hands on practice and aware to keep ab/adduction and flexion to shoulder height and below ot maintain cervical precautions:  URL: [email or web address] Date: 09/27/2019 Prepared by: Mariane Masters  Exercises Seated Shoulder Horizontal Adduction Abduction PROM with Caregiver - 1 x daily - 7 x weekly - 10 reps - 3 sets Seated Shoulder External Rotation and Internal Rotation PROM with Caregiver - 1 x daily - 7 x weekly - 10 reps - 3 sets Seated Shoulder Flexion PROM with Caregiver - 1 x daily - 7 x weekly - 10 reps - 3 sets Seated Shoulder Abduction PROM with Caregiver - 1 x daily - 7 x weekly - 10 reps - 3 sets  Husband able to demo all stretches with  min VC. Exited session wih tpt seated in TIS, exit alarm on and call light in reach Therapy Documentation Precautions:  Precautions Precautions: Fall, Cervical Precaution Booklet Issued: No Precaution Comments: reviewed cervical precautions Required Braces or Orthoses: Cervical Brace Cervical Brace: Hard collar Restrictions Weight Bearing Restrictions: No General:   Vital Signs:  Pain:   ADL: ADL Eating: Minimal assistance Grooming: Moderate assistance Upper Body Bathing: Maximal assistance (with hand over hand A to hold washcloth) Lower Body Bathing: Dependent Upper Body Dressing: Dependent Where Assessed-Upper Body Dressing: Edge of bed Lower Body Dressing: Dependent Where Assessed-Lower Body Dressing: Bed level Toilet Transfer: Unable to assess Tub/Shower Transfer Method: Unable to assess Vision   Perception    Praxis   Exercises:   Other Treatments:     Therapy/Group: Individual Therapy  Tonny Branch 09/27/2019, 12:16 PM

## 2019-09-27 NOTE — Progress Notes (Signed)
Physical Therapy Session Note  Patient Details  Name: Meredith Roach MRN: [id number] Date of Birth: 30-Mar-1941  Today's Date: 09/27/2019 PT Individual Time: 1015-1115 PT Individual Time Calculation (min): 60 min   Short Term Goals: Week 4:  PT Short Term Goal 1 (Week 4): family will be independent w/hoyer lift transfers bed to/from wc PT Short Term Goal 2 (Week 4): pt will be independent w/PWC operation including propulsion and TIS features PT Short Term Goal 3 (Week 4): gait 63ft in LRAD/LiteGait w/min assist 50% steps for clearance/advancement  Skilled Therapeutic Interventions/Progress Updates:    Pt received seated in TIS w/c in room, agreeable to PT session. No complaints of pain. Dependent transport via TIS w/c to/from therapy gym. Slide board transfer w/c to mat table with max A to the R. Assisted pt with donning LiteGait sling while seated EOM. Pt is able to stand to LiteGait with max A in order to correctly position sling. Pt is able to ambulate in LiteGait 4 x 40 ft with manual assist for lateral weight shifting and assist to advance LLE with onset of fatigue. Pt does require significant assist from LiteGait to unweight BLE during gait, increase in assist needed with onset of fatigue. Pt also reports tightness in LLE this date. Performed seated L hamstring and heel cord stretches 3 x 60 sec. Pt reports decrease in tightness following stretching. Provided handout for patient for where to purchase stedy online if she and her family wish to use this device upon d/c home. At end of session pt reports urge to urinate. Sit to stand with max A to stedy. Stedy transfer to toilet. Pt is dependent for clothing management. Once seated on toilet pt reports urge to have a BM. Pt left seated on toilet with stedy in place, call button in reach.  Therapy Documentation Precautions:  Precautions Precautions: Fall, Cervical Precaution Booklet Issued: No Precaution Comments: reviewed cervical  precautions Required Braces or Orthoses: Cervical Brace Cervical Brace: Hard collar Restrictions Weight Bearing Restrictions: No    Therapy/Group: Individual Therapy   Excell Seltzer, PT, DPT  09/27/2019, 4:27 PM

## 2019-09-27 NOTE — Progress Notes (Signed)
[place] PHYSICAL MEDICINE & REHABILITATION PROGRESS NOTE   Subjective/Complaints: Had bowel program last night. Has mushy stool.  Progressing very well with therapy.  Receiving in an out caths q6H Denies pain  ROS:  Pt denies SOB, abd pain, CP, N/V/C/D, and vision changes  Objective:   No results found. Recent Labs    09/25/19 1206  WBC 6.3  HGB 13.1  HCT 39.8  PLT 143*   Recent Labs    09/26/19 0714  NA 140  K 4.1  CL 105  CO2 26  GLUCOSE 99  BUN 10  CREATININE 0.60  CALCIUM 8.4*    Intake/Output Summary (Last 24 hours) at 09/27/2019 0911 Last data filed at 09/27/2019 0700 Gross per 24 hour  Intake 837 ml  Output 1200 ml  Net -363 ml     Physical Exam: Vital Signs Blood pressure (!) 109/53, pulse 82, temperature 98.3 F (36.8 C), resp. rate 18, height 5\' 3"  (1.6 m), weight 76.8 kg, SpO2 98 %. Constitutional: No distress . Vital signs reviewed.sitting up In bed- husband and daughter at bedside, NAD HEENT: EOMI, oral membranes moist Neck: supple Cardiovascular: RRR    Respiratory/Chest: CTA B/L- no W/R/R- good air movement GI/Abdomen: Soft, NT, ND, (+)BS  Ext: no clubbing, cyanosis, or edema Psych: more sad today Skin: Warm and dry.  Intact. Musc: K-tape bilateral hands.  Neuro: Ox3 Motor RUE: bicep 4/5, WE 3-/5, triceps 4-/5, grip 3+/5, finger abd 1+ to 2/5 LUE- biceps 4/5, WE 2/5, triceps 4-/5, grip 2+/5, finger abd 1/5- slightly improved in UEs Right lower extremity: Hip flexion, knee extension 3/5, ankle dorsiflexion 3+/5 Left lower extremity: Hip flexion, knee extension 2/5, ankle dorsiflexion 2/5 Functional mobility: Mod to Max A bed mobility  Assessment/Plan: 1. 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  Physiatrist is providing close team supervision and 24 hour management of active medical problems listed  below.  Physiatrist and rehab team continue to assess barriers to discharge/monitor patient progress toward functional and medical goals  Care Tool:  Bathing    Body parts bathed by patient: Left arm, Chest, Abdomen, Face, Right upper leg, Left upper leg   Body parts bathed by helper: Right arm, Front perineal area, Buttocks, Right lower leg, Left lower leg     Bathing assist Assist Level: Maximal Assistance - Patient 24 - 49%     Upper Body Dressing/Undressing Upper body dressing   What is the patient wearing?: Pull over shirt    Upper body assist Assist Level: Maximal Assistance - Patient 25 - 49%    Lower Body Dressing/Undressing Lower body dressing      What is the patient wearing?: Pants     Lower body assist Assist for lower body dressing: Total Assistance - Patient < 25%     Toileting Toileting Toileting Activity did not occur (Clothing management and hygiene only):  (pt is incontinent, not safe to sit on a BSC due to poor trunk control)  Toileting assist Assist for toileting: Moderate Assistance - Patient 50 - 74%     Transfers Chair/bed transfer  Transfers assist     Chair/bed transfer assist level: Total Assistance - Patient < 25%     Locomotion Ambulation   Ambulation assist   Ambulation activity did not occur: Safety/medical concerns  Assist level: 2 helpers Assistive device: Lite Gait Max distance: 10   Walk 10 feet activity   Assist  Walk 10 feet activity did not occur: Safety/medical concerns  Assist level: 2 helpers Assistive device: Lite Gait   Walk 50 feet activity   Assist Walk 50 feet with 2 turns activity did not occur: Safety/medical concerns         Walk 150 feet activity   Assist Walk 150 feet activity did not occur: Safety/medical concerns         Walk 10 feet on uneven surface  activity   Assist Walk 10 feet on uneven surfaces activity did not occur: Safety/medical concerns          Wheelchair     Assist Will patient use wheelchair at discharge?: Yes Type of Wheelchair:  (to be determined)    Wheelchair assist level: Supervision/Verbal cueing (power wc) Max wheelchair distance: 250    Wheelchair 50 feet with 2 turns activity    Assist        Assist Level: Supervision/Verbal cueing   Wheelchair 150 feet activity     Assist      Assist Level: Supervision/Verbal cueing   Blood pressure (!) 109/53, pulse 82, temperature 98.3 F (36.8 C), resp. rate 18, height 5\' 3"  (1.6 m), weight 76.8 kg, SpO2 98 %.  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 Status post ACDF 08/28/2019. Decadron taper. Cervical collar as directed. -PRAFOs to prevent Ankle contractures -Decadron taper completed on 6/5  6/8- has slowed recovery -depressing pt more-will start something.   6/10- slightly improved UEs strength since last week- slowing down.  Continue CIR  2. Antithrombotics: -DVT/anticoagulation:SCDs. Lovenox  -antiplatelet therapy: N/A 3. Pain Management:Baclofen 5 mg 3 times daily  oxycodone as needed  added lidoderm patch to R shoulder 9am-9pm  Gabapentin 100 twice daily started on 6/5 for neuropathic pain, improving, continue to titrate as necessary  -6/16 well controlled 4. Mood:Provide emotional support  6/8- starting Duloxetine 30 mg QHS- since more depressed AND to help with nerve pain.   6/9- mild nausea this AM- will monitor for side effects  6/10- nerve pain better- not resolved  6/16: mood has been very positive.  -antipsychotic agents: N/A 5. Neuropsych: This patientiscapable of making decisions on herown behalf. 6. Skin/Wound Care:Routine skin checksand turn while in bed every 2 hours while awake- keep on sides at night. 7. Fluids/Electrolytes/Nutrition:Routine in and  outs 8. Neurogenic bowel and bladder.    [place] program. Provide education.  Foley DC'd, increased Flomax, no sense of bladder filling  Continue I/O caths--needs lower volumes  6/11- no voiding on Max dose of Flomax  6/13 pt with large bm after dig stim last night  6/14- WILL NEED TO CON'T DIG STIM- also will place foley the day of d/c- can't go home without one  6/16: Had BM with dig stim last night.  9. GERD. Protonix 10. Hx of prediabetes confounded by steroid-induced hyperglycemia-   Monitor off steroids 11. Hx of gout-controlled 12. Leukocytosis with UTI:  Bactrim changed to Rocephin, completed course 13. Hyperkalemia  Potassium 5.1 on 6/4, labs ordered for tomorrow  6/7- K+ 5.6- give 10G Lokelma x1 and recheck labs  6/8- K+ down to 4.9 with 10 G of Lokelma  6/10- check labs in AM  6/11- K+ 4.3  6/15: K+ is 4.1 14. Thrombocytopenia  6/11- Plts down to 139k- lower but stable  6/14- Plts 143- slightly improved  -will need f/u after d/c.  15. Disposition: DC on 6/18.  Home DME: would  benefit from a manual tilt and space wheelchair and a hospital bed.   LOS: 27 days A FACE TO FACE EVALUATION WAS PERFORMED  Meredith Roach 09/27/2019, 9:11 AM

## 2019-09-28 ENCOUNTER — Inpatient Hospital Stay (HOSPITAL_COMMUNITY): Payer: Medicare PPO | Admitting: Physical Therapy

## 2019-09-28 ENCOUNTER — Inpatient Hospital Stay (HOSPITAL_COMMUNITY): Payer: Medicare PPO | Admitting: *Deleted

## 2019-09-28 ENCOUNTER — Inpatient Hospital Stay (HOSPITAL_COMMUNITY): Payer: Medicare PPO | Admitting: Occupational Therapy

## 2019-09-28 ENCOUNTER — Inpatient Hospital Stay (HOSPITAL_COMMUNITY): Payer: Medicare PPO

## 2019-09-28 NOTE — Progress Notes (Signed)
[place] PHYSICAL MEDICINE & REHABILITATION PROGRESS NOTE   Subjective/Complaints: No complaints this morning. Asks about discharge time tomorrow. Plans to go home with daughter in her car. Denies pain  ROS:  Pt denies SOB, abd pain, CP, N/V/C/D, and vision changes  Objective:   No results found. No results for input(s): WBC, HGB, HCT, PLT in the last 72 hours. Recent Labs    09/26/19 0714  NA 140  K 4.1  CL 105  CO2 26  GLUCOSE 99  BUN 10  CREATININE 0.60  CALCIUM 8.4*    Intake/Output Summary (Last 24 hours) at 09/28/2019 1545 Last data filed at 09/28/2019 1500 Gross per 24 hour  Intake 820 ml  Output 2526 ml  Net -1706 ml     Physical Exam: Vital Signs Blood pressure (!) 105/43, pulse 79, temperature 98.1 F (36.7 C), resp. rate 18, height 5\' 3"  (1.6 m), weight 76.8 kg, SpO2 96 %. Constitutional: No distress . Vital signs reviewed. Excited about discharge.  HEENT: EOMI, oral membranes moist Neck: hard collar in place Cardiovascular: RRR    Respiratory/Chest: CTA B/L- no W/R/R- good air movement GI/Abdomen: Soft, NT, ND, (+)BS  Ext: no clubbing, cyanosis, or edema Psych: more sad today Skin: Warm and dry.  Intact. Musc: K-tape bilateral hands.  Neuro: Ox3 Motor RUE: bicep 4/5, WE 3-/5, triceps 4-/5, grip 3+/5, finger abd 1+ to 2/5 LUE- biceps 4/5, WE 2/5, triceps 4-/5, grip 2+/5, finger abd 1/5- slightly improved in UEs Right lower extremity: Hip flexion, knee extension 3/5, ankle dorsiflexion 3+/5 Left lower extremity: Hip flexion, knee extension 2/5, ankle dorsiflexion 2/5 Functional mobility: Mod to Max A bed mobility  Assessment/Plan: 1. 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  Physiatrist is providing close team supervision and 24 hour management of active medical problems listed below.  Physiatrist and rehab team continue to  assess barriers to discharge/monitor patient progress toward functional and medical goals  Care Tool:  Bathing    Body parts bathed by patient: Left arm, Chest, Abdomen, Right upper leg, Left upper leg, Face   Body parts bathed by helper: Right arm, Front perineal area, Buttocks, Left lower leg, Right lower leg     Bathing assist Assist Level: Moderate Assistance - Patient 50 - 74%     Upper Body Dressing/Undressing Upper body dressing   What is the patient wearing?: Pull over shirt    Upper body assist Assist Level: Maximal Assistance - Patient 25 - 49%    Lower Body Dressing/Undressing Lower body dressing      What is the patient wearing?: Pants     Lower body assist Assist for lower body dressing: Total Assistance - Patient < 25%     Toileting Toileting Toileting Activity did not occur Landscape architect and hygiene only):  (pt is incontinent, not safe to sit on a BSC due to poor trunk control)  Toileting assist Assist for toileting: Dependent - Patient 0%     Transfers Chair/bed transfer  Transfers assist     Chair/bed transfer assist level: Maximal Assistance - Patient 25 - 49%     Locomotion Ambulation   Ambulation assist   Ambulation activity did not occur: Safety/medical concerns  Assist level: 2 helpers Assistive device: Lite Gait Max distance: 40'   Walk 10 feet activity   Assist  Walk 10 feet activity did not occur: Safety/medical concerns  Assist level: 2 helpers  Assistive device: Lite Gait   Walk 50 feet activity   Assist Walk 50 feet with 2 turns activity did not occur: Safety/medical concerns         Walk 150 feet activity   Assist Walk 150 feet activity did not occur: Safety/medical concerns         Walk 10 feet on uneven surface  activity   Assist Walk 10 feet on uneven surfaces activity did not occur: Safety/medical concerns         Wheelchair     Assist Will patient use wheelchair at discharge?:  Yes Type of Wheelchair: Power    Wheelchair assist level: Supervision/Verbal cueing Max wheelchair distance: 250    Wheelchair 50 feet with 2 turns activity    Assist        Assist Level: Supervision/Verbal cueing   Wheelchair 150 feet activity     Assist      Assist Level: Supervision/Verbal cueing   Blood pressure (!) 105/43, pulse 79, temperature 98.1 F (36.7 C), resp. rate 18, height 5\' 3"  (1.6 m), weight 76.8 kg, SpO2 96 %.  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 Status post ACDF 08/28/2019. Decadron taper. Cervical collar as directed. -PRAFOs to prevent Ankle contractures -Decadron taper completed on 6/5  6/8- has slowed recovery -depressing pt more-will start something.   6/10- slightly improved UEs strength since last week- slowing down.  DC tomorrow  2. Antithrombotics: -DVT/anticoagulation:SCDs. Lovenox  -antiplatelet therapy: N/A 3. Pain Management:Baclofen 5 mg 3 times daily  oxycodone as needed  added lidoderm patch to R shoulder 9am-9pm  Gabapentin 100 twice daily started on 6/5 for neuropathic pain, improving, continue to titrate as necessary  -6/17 well controlled 4. Mood:Provide emotional support  6/8- starting Duloxetine 30 mg QHS- since more depressed AND to help with nerve pain.   6/9- mild nausea this AM- will monitor for side effects  6/10- nerve pain better- not resolved  6/17: mood has been very positive -antipsychotic agents: N/A 5. Neuropsych: This patientiscapable of making decisions on herown behalf. 6. Skin/Wound Care:Routine skin checksand turn while in bed every 2 hours while awake- keep on sides at night. 7. Fluids/Electrolytes/Nutrition:Routine in and outs 8. Neurogenic bowel and bladder.    [place] program. Provide education.  Foley DC'd,  increased Flomax, no sense of bladder filling  Continue I/O caths--needs lower volumes  6/11- no voiding on Max dose of Flomax  6/13 pt with large bm after dig stim last night  6/14- WILL NEED TO CON'T DIG STIM- also will place foley the day of d/c- can't go home without one  6/16: Had BM with dig stim last night.  6/17: refused dig stim and suppository last night since she already had 2 BM yesterday.  9. GERD. Protonix 10. Hx of prediabetes confounded by steroid-induced hyperglycemia-   Monitor off steroids 11. Hx of gout-controlled 12. Leukocytosis with UTI:  Bactrim changed to Rocephin, completed course 13. Hyperkalemia  Potassium 5.1 on 6/4, labs ordered for tomorrow  6/7- K+ 5.6- give 10G Lokelma x1 and recheck labs  6/8- K+ down to 4.9 with 10 G of Lokelma  6/10- check labs in AM  6/11- K+ 4.3  6/15: K+ is 4.1 14. Thrombocytopenia  6/11- Plts down to 139k- lower but stable  6/14- Plts 143- slightly improved  -will need f/u after d/c.  15. Disposition: DC on 6/18.  Home DME: would benefit from a manual  tilt and space wheelchair and a hospital bed.   LOS: 28 days A FACE TO FACE EVALUATION WAS PERFORMED  Clide Deutscher Leveta Wahab 09/28/2019, 3:45 PM

## 2019-09-28 NOTE — Progress Notes (Signed)
Physical Therapy Session Note  Patient Details  Name: Meredith Roach MRN: [id number] Date of Birth: June 18, 1940  Today's Date: 09/28/2019 PT Individual Time: 1015-1100; 1500-1525 PT Individual Time Calculation (min): 45 min and 25 min  Short Term Goals: Week 4:  PT Short Term Goal 1 (Week 4): family will be independent w/hoyer lift transfers bed to/from wc PT Short Term Goal 2 (Week 4): pt will be independent w/PWC operation including propulsion and TIS features PT Short Term Goal 3 (Week 4): gait 43ft in LRAD/LiteGait w/min assist 50% steps for clearance/advancement  Skilled Therapeutic Interventions/Progress Updates:    Session 1: Pt received seated in TIS w/c in room, agreeable to PT session. No complaints of pain. Slide board transfer w/c to bed with max A to the R. Sit to supine mod A for BLE management. Rolling L/R with mod A and skilled cueing for UE and LE placement. Pt is max A to return to sitting EOB for LE management and trunk elevation, HOB slightly elevated. Sit to stand with max A to stedy. Stedy transfer back to w/c. Dependent transport via TIS w/c to/from therapy gym. Slide board transfer to/from mat table with mod A to max A with use of 4" step under BLE for support and improved positioning. Pt is then max A for sit to stand to [employer] walker x 4 reps. While in standing pt able to perform mini-squats 2 x 10 reps, alt LE lifts 2 x 10 reps with decreased ability to clear LE during second bout due to fatigue, and heel raises 2 x 10 reps to fatigue. Pt exhibits improved ability to maneuver BLE in standing this date but continues to exhibit overall decreased LE strength and endurance limiting ability to functionally utilize LE for mobility. Pt left semi-reclined in TIS w/c in room with needs in reach at end of session.  Session 2: Pt received seated in bed having just finished I/O caths with nursing. No complaints of pain and pt agreeable to therapy session. Pt is max A for supine to sit  via log-rolling. Session focus on use of stedy for BLE strengthening as pt will have a stedy available at home. Sit to stand with mod to max A to stedy. Standing mini-squats 2 x 10 reps, alt LE lifts 2 x 10 reps with decreased ability to lift LLE with onset of fatigue. Sit to stand x 5 reps from bed to stedy with min to mod A. Stedy transfer to w/c. Pt left semi-reclined in TIS w/c at end of session with needs in reach.   Therapy Documentation Precautions:  Precautions Precautions: Fall, Cervical Precaution Booklet Issued: No Precaution Comments: reviewed cervical precautions Required Braces or Orthoses: Cervical Brace Cervical Brace: Hard collar Restrictions Weight Bearing Restrictions: No   Therapy/Group: Individual Therapy   Excell Seltzer, PT, DPT  09/28/2019, 4:11 PM

## 2019-09-28 NOTE — Progress Notes (Addendum)
Patient ID: Meredith Roach, female   DOB: 02/04/1941, 79 y.o.   MRN: [id number]  SW left message for pt dtr [name] [phone]) to update on [facility]. SW also discussed therapy concerns related to pt not using ambulance transport home since car transfers were not practiced as pt has not been cleared for this yet. SW encouraged follow-up to get updates on family decision.  *SW received returned phone call from pt  daughter [name] and she said between herself (as a Marine scientist) and her husband who is an EMT they are comfortable with getting her in the home. They will have a gait belt and were able to find someone who has a stedy. There will be a total of 4 people to make sure getting her in the home is safe. Confirms all of the DME has been delivered to the home. Sw updated medical team. Medical team continues to state that this is not safe for patient and has not practiced, so we are unable to assist with getting her into a car. SW called pt dtr [name] to inform on updates. States she will speak with her family and will discuss plan. SW requested update in the morning on if ambulance transport is needed.   Loralee Pacas, MSW, [place] Office: [phone] Cell: [phone] Fax: [phone]

## 2019-09-28 NOTE — Progress Notes (Signed)
Occupational Therapy Session Note  Patient Details  Name: Meredith Roach MRN: [id number] Date of Birth: 06/17/1940  Today's Date: 09/28/2019 OT Individual Time: 0700-0757 OT Individual Time Calculation (min): 57 min    Short Term Goals: Week 4:  OT Short Term Goal 1 (Week 4): STG=LTG secondary to ELOS (LTG downgraded 2/2 lack of progress)  Skilled Therapeutic Interventions/Progress Updates:    Pt resting in bed upon arrival and ready to get OOB and get the day started. See Care Tool for assist levels for bathing/dressing at bed level and seated in w/c. Pt able to initiate moving BLE to EOB but required max A for supine>sit EOB. Sitting balance EOB with supervision while Stedy positioned for transfer.  Sit<>stand from EOB with min A in [place].  Min A for sit<>stand from paddles. Pt setup for self feeding and grooming tasks w/c level at sink.  Pt noted with increased grasp strength in L hand but limited extension. Pt able to grasp containers with RUE/hand without assistance from L hand. Pt pleased with progress during admission. Pt remained in TIS with all needs within reach and seat belt secured.   Therapy Documentation Precautions:  Precautions Precautions: Fall, Cervical Precaution Booklet Issued: No Precaution Comments: reviewed cervical precautions Required Braces or Orthoses: Cervical Brace Cervical Brace: Hard collar Restrictions Weight Bearing Restrictions: No Pain:  Pt c/o B hand numbness and "feeling cold all the time"; emotional support and repositioned   Therapy/Group: Individual Therapy  Leroy Libman 09/28/2019, 7:58 AM

## 2019-09-28 NOTE — Plan of Care (Signed)
  Problem: Consults Goal: RH GENERAL PATIENT EDUCATION Description: See Patient Education module for education specifics. Outcome: Progressing Goal: Skin Care Protocol Initiated - if Braden Score 18 or less Description: N/A Outcome: Progressing   Problem: RH BOWEL ELIMINATION Goal: RH STG MANAGE BOWEL WITH ASSISTANCE Description: STG Manage Bowel with max Assistance. Outcome: Progressing   Problem: RH BLADDER ELIMINATION Goal: RH STG MANAGE BLADDER WITH EQUIPMENT WITH ASSISTANCE Description: STG Manage Bladder With Equipment With Max Assistance Outcome: Progressing   Problem: RH SKIN INTEGRITY Goal: RH STG SKIN FREE OF INFECTION/BREAKDOWN Description: Remain free of breakdown during admission with max assist. Outcome: Progressing Goal: RH STG MAINTAIN SKIN INTEGRITY WITH ASSISTANCE Description: STG Maintain Skin Integrity With Max Assistance. Outcome: Progressing Goal: RH STG ABLE TO PERFORM INCISION/WOUND CARE W/ASSISTANCE Description: STG Able To Perform Incision/Wound Care With total Assistance from caregiver. Outcome: Progressing   Problem: RH SAFETY Goal: RH STG ADHERE TO SAFETY PRECAUTIONS W/ASSISTANCE/DEVICE Description: STG Adhere to Safety Precautions With Mod  Assistance Outcome: Progressing   Problem: RH PAIN MANAGEMENT Goal: RH STG PAIN MANAGED AT OR BELOW PT'S PAIN GOAL Description: Pain goal of 3 on pain scale of 0-10 Outcome: Progressing

## 2019-09-28 NOTE — Progress Notes (Signed)
Recreational Therapy Session Note  Patient Details  Name: Meredith Roach MRN: [id number] Date of Birth: [birth year] Today's Date: 09/28/2019  Pain: no c/o Skilled Therapeutic Interventions/Progress Updates: Pt up and reclined in w/c upon arrival.  Pt is excited about discharge although somewhat nervous.  Pt reports confidence in her family's ability to  care for her but wonders how everything will work out once she gets to her daughter's home.  Reviewed activity analysis and potential modifications, importance of leisure and its impact on overall health and wellness (social, emotional, physical & spiritual).  Discussed potential leisure and home management tasks for participation once discharged to meet the aforementioned domains.  Pt stated understanding of the above.  Pt is set up/supervision level for seated TR tasks given extra time for task completion.    .  Therapy/Group: Individual Therapy   Meredith Roach 09/28/2019, 2:50 PM

## 2019-09-28 NOTE — Progress Notes (Signed)
Occupational Therapy Session Note  Patient Details  Name: Meredith Roach MRN: [id number] Date of Birth: Jan 21, 1941  Today's Date: 09/28/2019 OT Individual Time: 1115-1200 OT Individual Time Calculation (min): 45 min    Short Term Goals: Week 4:  OT Short Term Goal 1 (Week 4): STG=LTG secondary to ELOS (LTG downgraded 2/2 lack of progress)  Skilled Therapeutic Interventions/Progress Updates:   Upon entering the room, pt seated in wheelchair with no c/o pain and agreeable to OT intervention. Pt with questions regarding HHOT recommendation with therapist providing education on process and expectation with pt asking questions and verbalizing understanding. OT assisted pt via wheelchair to gym. Pt engaged in B UE ergometer with L UE ACE wrapped to assist with grasping onto handle with rotation. Pt performed 4 reps 1 min, 1 min, 2 min, and 2 min. Pt fatigued with task but reports no pain. Pt verbalized pain in buttocks and tilted back for pressure relief in tilt in space for several minutes. OT assisted pt back into room where husband was not present. He did not have any further questions. Pt remained in tilt in space chair with call bell and all needed items within reach.   Therapy Documentation Precautions:  Precautions Precautions: Fall, Cervical Precaution Booklet Issued: No Precaution Comments: reviewed cervical precautions Required Braces or Orthoses: Cervical Brace Cervical Brace: Hard collar Restrictions Weight Bearing Restrictions: No  Pain: Pain Assessment Pain Scale: 0-10 Pain Score: 0-No pain ADL: ADL Eating: Minimal assistance Grooming: Moderate assistance Upper Body Bathing: Maximal assistance (with hand over hand A to hold washcloth) Lower Body Bathing: Dependent Upper Body Dressing: Dependent Where Assessed-Upper Body Dressing: Edge of bed Lower Body Dressing: Dependent Where Assessed-Lower Body Dressing: Bed level Toilet Transfer: Unable to assess Tub/Shower  Transfer Method: Unable to assess Praxis Praxis: Intact   Therapy/Group: Individual Therapy  Gypsy Decant 09/28/2019, 12:04 PM

## 2019-09-28 NOTE — Progress Notes (Addendum)
Physical Therapy Discharge Summary  Patient Details  Name: Meredith Roach MRN: [id number] Date of Birth: August 06, 1940   Patient has met 3 of 5 long term goals due to improved activity tolerance, improved balance, improved postural control, increased strength and ability to compensate for deficits.  Patient to discharge at a wheelchair level Max Assist to total A.   Patient's care partner is independent to provide the necessary physical assistance at discharge. Patient's husband and adult daughters have completed hands-on family education and are safe to assist patient upon d/c home with regards to bed mobility and transfers via hoyer lift. Pt and family with questions with regards to how to perform a car transfer. They have been educated that therapy does not recommend a car transfer at this time due to pt's deficits. Therapy is recommending use of medical transport home and use of wheelchair accessible transportation in the community to/from doctor's appointments, etc. Per pt her family is planning on transporting her home upon d/c via their family car. Reiterated that therapy does not recommend a car transfer at this time due to pt's deficits.  Reasons goals not met: Pt did not meet bed mobility and sit to stand goals as she does still require max A at times for these transfers.   Recommendation:  Patient will benefit from ongoing skilled PT services in home health setting to continue to advance safe functional mobility, address ongoing impairments in endurance, strength, balance, safety, independence with functional mobility, and minimize fall risk.  Equipment: 30" slide board, hospital bed, loaner TIS w/c  Reasons for discharge: treatment goals met and discharge from hospital  Patient/family agrees with progress made and goals achieved: Yes  PT Discharge Precautions/Restrictions Precautions Precautions: Fall;Cervical Precaution Comments: reviewed cervical precautions Required Braces or  Orthoses: Cervical Brace Cervical Brace: Hard collar Restrictions Weight Bearing Restrictions: No Vision/Perception  Perception Perception: Within Functional Limits Praxis Praxis: Intact  Cognition Overall Cognitive Status: Within Functional Limits for tasks assessed Arousal/Alertness: Awake/alert Orientation Level: Oriented X4 Attention: Focused Focused Attention: Appears intact Memory: Appears intact Awareness: Appears intact Problem Solving: Appears intact Safety/Judgment: Appears intact Sensation Sensation Light Touch: Impaired Detail Light Touch Impaired Details: Impaired RLE;Impaired LLE (L>R; distal>proximal (medial/lateral foot)) Proprioception: Appears Intact Coordination Gross Motor Movements are Fluid and Coordinated: No Fine Motor Movements are Fluid and Coordinated: No Coordination and Movement Description: 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 Motor  Motor Motor: Tetraplegia;Abnormal tone;Abnormal postural alignment and control Motor - Skilled Clinical Observations: incomplete tetraplegia Motor - Discharge Observations: incomplete tetraplegia  Mobility Bed Mobility Bed Mobility: Rolling Right;Rolling Left;Supine to Sit;Sit to Supine Rolling Right: Moderate Assistance - Patient 50-74% Rolling Left: Moderate Assistance - Patient 50-74% Supine to Sit: Maximal Assistance - Patient - Patient 25-49% Sit to Supine: Moderate Assistance - Patient 50-74% Transfers Transfers: Sit to Stand;Stand to Sit;Lateral/Scoot Transfers;Transfer via Lift Equipment Sit to Stand: Maximal Assistance - Patient 25-49% Stand to Sit: Maximal Assistance - Patient 25-49% Lateral/Scoot Transfers: Maximal Assistance - Patient 25-49% Transfer (Assistive device): Other (Comment) (slide board) Transfer via Lift Equipment: Animal nutritionist: Yes Gait Assistance: Dependent - Radiographer, therapeutic (Feet): 40 Feet Assistive device: Body weight support  system Gait Gait: Yes Gait Pattern: Impaired Gait Pattern: Step-to pattern;Decreased step length - right;Decreased step length - left;Decreased hip/knee flexion - right;Decreased hip/knee flexion - left;Decreased weight shift to right;Scissoring Gait velocity: decreased Stairs / Additional Locomotion Stairs: No Wheelchair Mobility Wheelchair Mobility: Yes Wheelchair Assistance: Chartered loss adjuster: Power Wheelchair Parts Management: Needs assistance  Distance: 250  Trunk/Postural Assessment  Cervical Assessment Cervical Assessment: Exceptions to [facility] (cervical precautions) Thoracic Assessment Thoracic Assessment: Exceptions to [facility] (rounded shoulders; kyphotic) Lumbar Assessment Lumbar Assessment: Exceptions to [facility] (posterior pelvic tilt) Postural Control Postural Control: Deficits on evaluation Trunk Control: improved since eval, ongoing deficits  Balance Balance Balance Assessed: Yes Static Sitting Balance Static Sitting - Balance Support: No upper extremity supported;Feet supported Static Sitting - Level of Assistance: 5: Stand by assistance Dynamic Sitting Balance Dynamic Sitting - Balance Support: No upper extremity supported;Feet supported;During functional activity Dynamic Sitting - Level of Assistance: 4: Min Insurance risk surveyor Standing - Balance Support: Bilateral upper extremity supported;During functional activity Static Standing - Level of Assistance: 4: Min assist Dynamic Standing Balance Dynamic Standing - Balance Support: Bilateral upper extremity supported;During functional activity Dynamic Standing - Level of Assistance: 1: +2 Total assist Extremity Assessment   RLE Assessment RLE Assessment: Exceptions to [facility] General Strength Comments: impaired, see below RLE Strength Right Hip Flexion: 3+/5 Right Knee Flexion: 3/5 Right Knee Extension: 5/5 Right Ankle Dorsiflexion: 2+/5 Right Ankle Plantar Flexion:  3-/5 LLE Assessment LLE Assessment: Exceptions to [facility] General Strength Comments: impaired, see below LLE Strength Left Hip Flexion: 2/5 Left Knee Flexion: 3/5 Left Knee Extension: 4/5 Left Ankle Dorsiflexion: 2+/5 Left Ankle Plantar Flexion: 3-/5     Excell Seltzer, PT, DPT 09/28/2019, 3:40 PM

## 2019-09-28 NOTE — Progress Notes (Signed)
Patient refused bowel program. Patient stated "I have been able to go on my own". Will report to night nurse. Amanda Cockayne, LPN

## 2019-09-28 NOTE — Progress Notes (Signed)
Occupational Therapy Discharge Summary  Patient Details  Name: Meredith Roach MRN: [id number] Date of Birth: 1940/05/19   Patient has met 6 of 6 long term goals due to improved activity tolerance, improved balance, ability to compensate for deficits and improved strength and coordination of B UEs.  Pt made slow but steady progress with BADLs, BUE, BUE function, and sitting balance during this admission.  LTGs downgraded during admission 2/2 slow progress. Pt requires setup for grooming and self feeding with adaptive utensils. Pt is mod A for bathing and max A for UB dressing tasks.  LB dressing at bed level with tot A.  Pt dependent for toileting tasks and transfer with Stedy. Pt's family has participated in education and is proficient with use of Hoyer.  Pt stated her daughter was able to acquire a Stedy to use at home.  Patient to discharge at overall min - max  level.  Patient's care partner is independent to provide the necessary physical assistance at discharge.      Recommendation:  Patient will benefit from ongoing skilled OT services in home health setting to continue to advance functional skills in the area of BADL and Reduce care partner burden.  Equipment: No equipment provided  Reasons for discharge: treatment goals met  Patient/family agrees with progress made and goals achieved: Yes  OT Discharge Vision Baseline Vision/History: Wears glasses Wears Glasses: Reading only Patient Visual Report: No change from baseline Vision Assessment?: No apparent visual deficits Perception  Perception: Within Functional Limits Praxis Praxis: Intact Cognition Overall Cognitive Status: Within Functional Limits for tasks assessed Arousal/Alertness: Awake/alert Attention: Focused Focused Attention: Appears intact Memory: Appears intact Awareness: Appears intact Problem Solving: Appears intact Safety/Judgment: Appears intact Sensation Sensation Light Touch: Impaired  Detail Peripheral sensation comments: BUE/hand "numbness" Light Touch Impaired Details: Impaired RUE;Impaired LUE Hot/Cold: Impaired by gross assessment Proprioception: Impaired by gross assessment Stereognosis: Impaired by gross assessment Motor  Motor Motor: Tetraplegia;Abnormal tone;Abnormal postural alignment and control Motor - Skilled Clinical Observations: incomplete tetraplegia     Trunk/Postural Assessment  Cervical Assessment Cervical Assessment:  (cervical precautions) Thoracic Assessment Thoracic Assessment:  (rounded shoulders) Lumbar Assessment Lumbar Assessment:  (posterior pelvic tilt) Postural Control Postural Control: Deficits on evaluation (delayed)  Balance Static Sitting Balance Static Sitting - Balance Support: Bilateral upper extremity supported Static Sitting - Level of Assistance: 5: Stand by assistance Dynamic Sitting Balance Dynamic Sitting - Balance Support: During functional activity Dynamic Sitting - Level of Assistance: 4: Min assist Extremity/Trunk Assessment RUE Assessment RUE Assessment: Exceptions to [facility] RUE Body System: Neuro RUE AROM (degrees) Right Shoulder Flexion: 160 Degrees Right Wrist Extension: 45 Degrees Right Composite Finger Extension: 50% Right Composite Finger Flexion: 75% RUE PROM (degrees) Right Shoulder Flexion: 160 Degrees RUE Strength Right Shoulder Flexion: 3+/5 Right Elbow Flexion: 3+/5 Right Elbow Extension: 3+/5 Right Hand Gross Grasp: Functional LUE Assessment LUE Assessment: Exceptions to [facility] LUE AROM (degrees) Left Shoulder Flexion: 180 Degrees Left Composite Finger Extension: 25% Left Composite Finger Flexion: 25% LUE PROM (degrees) Left Elbow Flexion: 110 LUE Strength Left Elbow Flexion: 4/5 Left Elbow Extension: 3+/5   Leroy Libman 09/28/2019, 8:06 AM

## 2019-09-29 MED ORDER — LIDOCAINE 5 % EX PTCH: 1.0000 | MEDICATED_PATCH | CUTANEOUS | 0 refills | Status: DC

## 2019-09-29 MED ORDER — TAMSULOSIN HCL 0.4 MG PO CAPS: 0.8000 mg | ORAL_CAPSULE | Freq: Every day | ORAL | 0 refills | Status: DC

## 2019-09-29 MED ORDER — BISACODYL 10 MG RE SUPP: 10.0000 mg | Freq: Every day | RECTAL | 0 refills | Status: DC

## 2019-09-29 MED ORDER — BACLOFEN 10 MG PO TABS: 10.0000 mg | ORAL_TABLET | Freq: Three times a day (TID) | ORAL | 0 refills | Status: DC

## 2019-09-29 MED ORDER — GABAPENTIN 100 MG PO CAPS: 100.0000 mg | ORAL_CAPSULE | Freq: Two times a day (BID) | ORAL | 0 refills | Status: DC

## 2019-09-29 MED ORDER — SENNA 8.6 MG PO TABS: 2.0000 | ORAL_TABLET | Freq: Every day | ORAL | 0 refills | Status: DC

## 2019-09-29 MED ORDER — LIDOCAINE HCL URETHRAL/MUCOSAL 2 % EX GEL: 1.0000 "application " | Freq: Every day | CUTANEOUS | 0 refills | Status: DC | PRN

## 2019-09-29 MED ORDER — ENOXAPARIN SODIUM 40 MG/0.4ML ~~LOC~~ SOLN: 40.0000 mg | Freq: Every day | SUBCUTANEOUS | 2 refills | Status: DC

## 2019-09-29 MED ORDER — DULOXETINE HCL 30 MG PO CPEP: 30.0000 mg | ORAL_CAPSULE | Freq: Every day | ORAL | 0 refills | Status: DC

## 2019-09-29 NOTE — Progress Notes (Signed)
Recreational Therapy Discharge Summary Patient Details  Name: Meredith Roach MRN: [id number] Date of Birth: 1940/10/08 Today's Date: 09/29/2019 Long term goals set: 1  Long term goals met: 1  Comments on progress toward goals: Pt is discharging home today with family to provide/coordinate 24 hour assistance.  TR sessions mainly focused on pt education including leisure education, activity analysis identifying potential modifications & discharge planning.  Pt is discharging at supervision/set up assistance level for simple TR tasks seated w/c level given extra time for task completion.  Pt remains highly motivated and determined to make further progress. Reasons goals not met: n/a  Equipment acquired: n/a  Reasons for discharge: discharge from hospital  Patient/family agrees with progress made and goals achieved: Yes  [name] 09/29/2019, 8:20 AM

## 2019-09-29 NOTE — Progress Notes (Signed)
Patient left with medical transport.  Husband and daughter left prior.  All instructions and education given and all verbalized understanding.  Belongings were given to the patient's daughter and husband.  No obvious acute distress or discomfort noted.

## 2019-09-29 NOTE — Discharge Instructions (Signed)
Inpatient Rehab Discharge Instructions  Meredith Roach Discharge date and time:  09/29/19  Activities/Precautions/ Functional Status: Activity: Continue to wear cervical collar as directed Diet: regular diet Wound Care: keep wound clean and dry    Functional status:  ___ No restrictions     ___ Walk up steps independently _X__ 24/7 supervision/assistance   ___ Walk up steps with assistance ___ Intermittent supervision/assistance  ___ Bathe/dress independently ___ Walk with walker     _X__ Bathe/dress with assistance ___ Walk Independently    ___ Shower independently ___ Walk with assistance    ___ Shower with assistance _X__ No alcohol     ___ Return to work/school ________   COMMUNITY REFERRALS UPON DISCHARGE:    Home Health:   PT     OT   SNA                Agency: [facility] Phone:[phone]  *Please expect follow-up within 2-3 days of discharge to schedule your home visit. If you have not received follow-up, be sure to contact the branch directly. *   Medical Equipment/Items Ordered: hospital bed, slide board, manual tilt and space                                                 Agency/Supplier: [facility] [phone]   Special Instructions: 1. No driving, smoking or alcohol 2. Need to perform foley care twice a day and as needed after BM. 3. Need to perform bowel program daily to prevent accidents or obstruction.  --do digital stimulation, then insert suppository followed by stimulation.  Repeat 1-2X every 10 minutes till rectal vault is clear.    My questions have been answered and I understand these instructions. I will adhere to these goals and the provided educational materials after my discharge from the hospital.  Patient/Caregiver Signature _______________________________ Date __________  Clinician Signature _______________________________________ Date __________  Please bring this form and your medication list with you to all  your follow-up doctor's appointments.

## 2019-09-29 NOTE — Progress Notes (Signed)
Patient had indwelling foley #37F placed with gravity bag hanging below her bladder and from the bed. Patient tolerated well without any signs or symptoms of acute distress.  400cc's of clear yellow urine noted as output when the foley was placed. Foley cath care teaching and bowel regiment was also gone over as well.  Patient's daughter and husband verbalized understanding.

## 2019-09-29 NOTE — Progress Notes (Signed)
[facility] PHYSICAL MEDICINE & REHABILITATION PROGRESS NOTE   Subjective/Complaints: No complaints this morning. Daughter has agreed to use EMS transport for discharge to home.  ROS:  Pt denies SOB, abd pain, CP, N/V/C/D, and vision changes  Objective:   No results found. No results for input(s): WBC, HGB, HCT, PLT in the last 72 hours. No results for input(s): NA, K, CL, CO2, GLUCOSE, BUN, CREATININE, CALCIUM in the last 72 hours.  Intake/Output Summary (Last 24 hours) at 09/29/2019 0945 Last data filed at 09/29/2019 0830 Gross per 24 hour  Intake 1260 ml  Output 1750 ml  Net -490 ml     Physical Exam: Vital Signs Blood pressure (!) 123/104, pulse 79, temperature 98.2 F (36.8 C), resp. rate 18, height 5\' 3"  (1.6 m), weight 76.8 kg, SpO2 95 %. Constitutional: No distress . Vital signs reviewed. Excited about discharge.  HEENT: EOMI, oral membranes moist Neck: hard collar in place Cardiovascular: RRR    Respiratory/Chest: CTA B/L- no W/R/R- good air movement GI/Abdomen: Soft, NT, ND, (+)BS  Ext: no clubbing, cyanosis, or edema Psych: more sad today Skin: Warm and dry.  Intact. Musc: K-tape bilateral hands.  Neuro: Ox3 Motor RUE: bicep 4/5, WE 3-/5, triceps 4-/5, grip 3+/5, finger abd 1+ to 2/5 LUE- biceps 4/5, WE 2/5, triceps 4-/5, grip 2+/5, finger abd 1/5- slightly improved in UEs Right lower extremity: Hip flexion, knee extension 3/5, ankle dorsiflexion 3+/5 Left lower extremity: Hip flexion, knee extension 2/5, ankle dorsiflexion 2/5 Functional mobility: Mod to Max A bed mobility, total assist for lower body dressing  Assessment/Plan: 1. 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  Physiatrist is providing close team supervision and 24 hour management of active medical problems listed below.  Physiatrist and rehab team continue to assess barriers to  discharge/monitor patient progress toward functional and medical goals  Care Tool:  Bathing    Body parts bathed by patient: Left arm, Chest, Abdomen, Right upper leg, Left upper leg, Face   Body parts bathed by helper: Right arm, Front perineal area, Buttocks, Left lower leg, Right lower leg     Bathing assist Assist Level: Moderate Assistance - Patient 50 - 74%     Upper Body Dressing/Undressing Upper body dressing   What is the patient wearing?: Pull over shirt    Upper body assist Assist Level: Maximal Assistance - Patient 25 - 49%    Lower Body Dressing/Undressing Lower body dressing      What is the patient wearing?: Pants     Lower body assist Assist for lower body dressing: Total Assistance - Patient < 25%     Toileting Toileting Toileting Activity did not occur Landscape architect and hygiene only):  (pt is incontinent, not safe to sit on a BSC due to poor trunk control)  Toileting assist Assist for toileting: Dependent - Patient 0%     Transfers Chair/bed transfer  Transfers assist     Chair/bed transfer assist level: Maximal Assistance - Patient 25 - 49%     Locomotion Ambulation   Ambulation assist   Ambulation activity did not occur: Safety/medical concerns  Assist level: 2 helpers Assistive device: Lite Gait Max distance: 40'   Walk 10 feet activity   Assist  Walk 10 feet activity did not occur: Safety/medical concerns  Assist level: 2 helpers Assistive device: Lite Gait   Walk 50 feet activity   Assist Walk 50  feet with 2 turns activity did not occur: Safety/medical concerns         Walk 150 feet activity   Assist Walk 150 feet activity did not occur: Safety/medical concerns         Walk 10 feet on uneven surface  activity   Assist Walk 10 feet on uneven surfaces activity did not occur: Safety/medical concerns         Wheelchair     Assist Will patient use wheelchair at discharge?: Yes Type of  Wheelchair: Power    Wheelchair assist level: Supervision/Verbal cueing Max wheelchair distance: 250    Wheelchair 50 feet with 2 turns activity    Assist        Assist Level: Supervision/Verbal cueing   Wheelchair 150 feet activity     Assist      Assist Level: Supervision/Verbal cueing   Blood pressure (!) 123/104, pulse 79, temperature 98.2 F (36.8 C), resp. rate 18, height 5\' 3"  (1.6 m), weight 76.8 kg, SpO2 95 %.  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 Status post ACDF 08/28/2019. Decadron taper. Cervical collar as directed. -PRAFOs to prevent Ankle contractures -Decadron taper completed on 6/5  6/8- has slowed recovery -depressing pt more-will start something.   6/10- slightly improved UEs strength since last week- slowing down.  DC today 2. Antithrombotics: -DVT/anticoagulation:SCDs. Lovenox  -antiplatelet therapy: N/A 3. Pain Management:Baclofen 5 mg 3 times daily  oxycodone as needed  added lidoderm patch to R shoulder 9am-9pm  Gabapentin 100 twice daily started on 6/5 for neuropathic pain, improving, continue to titrate as necessary  -6/18 well controlled.  4. Mood:Provide emotional support  6/8- starting Duloxetine 30 mg QHS- since more depressed AND to help with nerve pain.   6/9- mild nausea this AM- will monitor for side effects  6/10- nerve pain better- not resolved  6/18: mood positive.  -antipsychotic agents: N/A 5. Neuropsych: This patientiscapable of making decisions on herown behalf. 6. Skin/Wound Care:Routine skin checksand turn while in bed every 2 hours while awake- keep on sides at night. 7. Fluids/Electrolytes/Nutrition:Routine in and outs 8. Neurogenic bowel and bladder.    [place] program. Provide education.  Foley DC'd, increased Flomax, no sense of  bladder filling  Continue I/O caths--needs lower volumes  6/11- no voiding on Max dose of Flomax  6/13 pt with large bm after dig stim last night  6/14- WILL NEED TO CON'T DIG STIM- also will place foley the day of d/c- can't go home without one  6/16: Had BM with dig stim last night.  6/17: refused dig stim and suppository last night since she already had 2 BM yesterday.   6/18: having BM without dig stim.  9. GERD. Protonix 10. Hx of prediabetes confounded by steroid-induced hyperglycemia-   Monitor off steroids 11. Hx of gout-controlled 12. Leukocytosis with UTI:  Bactrim changed to Rocephin, completed course 13. Hyperkalemia  Potassium 5.1 on 6/4, labs ordered for tomorrow  6/7- K+ 5.6- give 10G Lokelma x1 and recheck labs  6/8- K+ down to 4.9 with 10 G of Lokelma  6/10- check labs in AM  6/11- K+ 4.3  6/15: K+ is 4.1 14. Thrombocytopenia  6/11- Plts down to 139k- lower but stable  6/14- Plts 143- slightly improved  -will need f/u after d/c.  15. Disposition: DC on 6/18.  Home DME: would benefit from a manual tilt and space wheelchair and a hospital bed.  Will d/c via ambulance today  <30 minutes spent on discharge today.   LOS: 29 days A FACE TO FACE EVALUATION WAS PERFORMED  Martha Clan P Amiere Cawley 09/29/2019, 9:45 AM

## 2019-09-29 NOTE — Progress Notes (Signed)
Patient ID: Meredith Roach, female   DOB: August 04, 1940, 79 y.o.   MRN: [id number]  SW received message from pt dtr (6/18) stating they will use EMS transport at d/c to home.   SW scheduled ambulance pick up with [facility] ([phone]) at 11:30am. Sw informed medical team, and called pt dtr Meredith Roach [phone]) on pick up time. SW met with pt to inform on pick up time as well.   Loralee Pacas, MSW, [place] Office: [phone] Cell: [phone] Fax: [phone]

## 2019-10-02 NOTE — Progress Notes (Signed)
Inpatient Rehabilitation Care Coordinator  Discharge Note  The overall goal for the admission was met for:   Discharge location: Yes. D/c to her daughter Kim's home in [place].   Length of Stay: Yes. 29 days.   Discharge activity level: Yes. Max Assist  Home/community participation: Yes. Limited.  Services provided included: MD, RD, PT, OT, SLP, RN, CM, TR, Pharmacy, [place]: Private Insurance: [employer]  Follow-up services arranged: Home Health: [facility] for PT/OT/SNA, DME: [facility] for hospital bed, slide board, manual tilt and space (speciality w/c) and Patient/Family request agency HH: [facility], DME: N/A  Comments (or additional information): contact pt husband [name] [phone] or dtr [name] [phone]  Patient/Family verbalized understanding of follow-up arrangements: Yes  Individual responsible for coordination of the follow-up plan: Pt to have  Assistance with coordinating pt care needs.   Confirmed correct DME delivered: Rana Snare 10/02/2019    Rana Snare

## 2019-10-03 ENCOUNTER — Telehealth: Payer: Self-pay | Admitting: Physical Medicine and Rehabilitation

## 2019-10-03 NOTE — Telephone Encounter (Signed)
Per [name] ( on contact list) [facility] can take her on as new patient, sooner.  Please call Kim back.

## 2019-10-03 NOTE — Telephone Encounter (Signed)
Patient's daughter [name] needs a call back about home health orders.  [facility] can't start the patient for another 3 weeks and would like to see if another agency can staff it.  Daughter is very concerned about this.  I have put a email out to the social worker Loralee Pacas about this.

## 2019-10-03 NOTE — Discharge Summary (Signed)
Physician Discharge Summary  Patient ID: Meredith Roach MRN: [id number] DOB/AGE: 1941/04/11 79 y.o.  Admit date: 08/31/2019 Discharge date: 09/29/2019  Discharge Diagnoses:  Principal Problem:   Quadriplegia [facility]) Active Problems:   S/P cervical spinal fusion   Hyperkalemia   Steroid-induced hyperglycemia   Prediabetes   Neurogenic bowel   Neurogenic bladder   Neuropathic pain   Discharged Condition: Stable  Significant Diagnostic Studies: No results found.  Labs:  Basic Metabolic Panel: No results for input(s): NA, K, CL, CO2, GLUCOSE, BUN, CREATININE, CALCIUM, MG, PHOS in the last 168 hours.  CBC: No results for input(s): WBC, NEUTROABS, HGB, HCT, MCV, PLT in the last 168 hours.  CBG: Recent Labs  Lab 09/27/19 1204  GLUCAP 88   Family history.  Maternal aunt with breast cancer paternal aunt with breast cancer Father with CAD.  Denies any colon cancer rectal cancer or esophageal cancer  Brief HPI:   Meredith Roach is a 79 y.o. right-handed female with history of gout as well as prediabetes.  Lives with spouse independent prior to admission.  Presented 08/26/2019 after ground-level fall with resulting quadriparesis.  MRI cervical spine showed severe spondylosis spinal stenosis with cord compression from C3 C7 with signal change in spinal cord.  CT MRI of the brain no acute intracranial abnormalities.  Patient underwent decompressive anterior cervical discectomy C3-4 4-5 C5-6 and C6-7 with anterior cervical arthrodesis 08/28/2019 per Dr. Sherley Bounds.  Aspen collar as directed.  Patient was admitted for a comprehensive rehab program.   Hospital Course: Meredith Roach was admitted to rehab 08/31/2019 for inpatient therapies to consist of PT, ST and OT at least three hours five days a week. Past admission physiatrist, therapy team and rehab RN have worked together to provide customized collaborative inpatient rehab.  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  Surgical site healing nicely.  Cervical collar as directed.  Subcutaneous Lovenox for DVT prophylaxis.  Pain managed with use of oxycodone as needed scheduled baclofen for spasticity as well as gabapentin for neuropathic pain.  Mood stabilization with the use of duloxetine and emotional support provided.  Neurogenic bowel and bladder started on bowel program Foley catheter tube removed maintained on Flomax digital stimulation bowel program education completed.  Prediabetes suspect related to Decadron therapy after recent surgery blood sugars controlled.   Blood pressures were monitored on TID basis and controlled    Rehab course: During patient's stay in rehab weekly team conferences were held to monitor patient's progress, set goals and discuss barriers to discharge. At admission, patient required +2 physical assist sit to stand +2 physical assist for rolling.  Max assist upper body bathing total assist lower body bathing total assist upper body dressing total assist lower body dressing  Physical exam.  Blood pressure 122/62 pulse 82 temperature 92 respirations 20 oxygen saturation 95% room air Constitutional alert and oriented x3 HEENT Head.  Normocephalic and atraumatic Eyes.  Pupils round and reactive to light no discharge.nystagmus Cardiac regular rate rhythm without any extra sounds or murmur heard Respiratory effort normal no respiratory distress without wheeze Abdomen.  Soft nontender positive bowel sounds without rebound Neck.  No tracheal deviation present right anterior neck incision clean and dry no drainage Musculoskeletal.  Right upper extremity biceps 4/5 wrist extension 3/5 triceps 4/5 grip and finger abduction 0/5 Left upper extremity biceps 4/5 wrist extension 3/5 triceps 3/5 grip and  finger abduction 0/5 Right lower extremity hip flexion 2+/5  knee extension 4 -/5 dorsi flexion 1/5 plantar flexion 3/5 EHL 0/5 Left lower extremity hip flexion 2/5 knee extension 3+/5 dorsiflexion 0/5 plantar flexion 3 -/5 EHL 1/5  /She  has had improvement in activity tolerance, balance, postural control as well as ability to compensate for deficits. She has had improvement in functional use RUE/LUE  and RLE/LLE as well as improvement in awareness.  Working with energy conservation techniques.  Patient discharged at a wheelchair level max assist to total assist.  Patient's husband and daughter completed hands-on family education were safe to assist patient on discharge home with regards to bed mobility transfers with [facility] lift.  Full family teaching completed plan discharge to home       Disposition: Discharged home   Diet: Regular  Special Instructions: No smoking or alcohol  Medications at discharge 1.  Baclofen 10 mg 3 times daily 2.  Dulcolax suppository daily after supper 3.  Cymbalta 30 mg nightly 4.  Subcutaneous Lovenox 40 mg daily 5.  Neurontin 100 mg p.o. twice daily 6.  Lidoderm patch as directed 7.  Prilosec 40 mg daily 8.  Senokot 2 tablets daily 9.  Flomax 0.8 mg supper  30-35 minutes were spent completing discharge summary and discharge planning  Discharge Instructions    Ambulatory referral to Physical Medicine Rehab   Complete by: As directed    Moderate complexity follow-up 1 to 2 weeks tetraplegia     Allergies as of 09/29/2019      Reactions   Ace Inhibitors Anaphylaxis      Medication List    STOP taking these medications   acetaminophen 500 MG tablet Commonly known as: TYLENOL   GLUCOSAMINE CHONDR 1500 COMPLX PO   hydrochlorothiazide 25 MG tablet Commonly known as: HYDRODIURIL   Oxycodone HCl 10 MG Tabs     TAKE these medications   baclofen 10 MG tablet Commonly known as: LIORESAL Take 1 tablet (10 mg total) by mouth 3 (three) times daily.   bisacodyl 10 MG suppository Commonly known as:  DULCOLAX Place 1 suppository (10 mg total) rectally daily. After supper.   DULoxetine 30 MG capsule Commonly known as: CYMBALTA Take 1 capsule (30 mg total) by mouth at bedtime.   enoxaparin 40 MG/0.4ML injection Commonly known as: LOVENOX Inject 0.4 mLs (40 mg total) into the skin daily.   gabapentin 100 MG capsule Commonly known as: NEURONTIN Take 1 capsule (100 mg total) by mouth 2 (two) times daily.   lidocaine 2 % jelly Commonly known as: XYLOCAINE Apply 1 application topically daily as needed (bowel program). Notes to patient: Apply to rectum prior to digital stimulation.    lidocaine 5 % Commonly known as: LIDODERM Place 1 patch onto the skin daily. Apply to right shoulder at 9 am and remove at 9 pm daily. Can purchase over the counter.   omeprazole 40 MG capsule Commonly known as: PRILOSEC Take 40 mg by mouth at bedtime.   senna 8.6 MG Tabs tablet Commonly known as: SENOKOT Take 2 tablets (17.2 mg total) by mouth daily.   tamsulosin 0.4 MG Caps capsule Commonly known as: FLOMAX Take 2 capsules (0.8 mg total) by mouth daily after supper.       Follow-up Information    Lovorn, Jinny Blossom, MD Follow up.   Specialty: Physical Medicine and Rehabilitation Why: Office to call for appointment Contact information: [address] [place] [address] [phone]  [name], MD. Call on 10/02/2019.   Specialty: Neurosurgery Why: Call for appointment Contact information: [address] [place] [address] [phone]        Rusty Aus, MD. Call on 10/02/2019.   Specialty: Internal Medicine Why: for post hospital follow up. Needs repeat CBC to monitor thrombocytopenia.  Contact information: [place] [address] [phone]               Signed: Cathlyn Parsons 10/03/2019, 12:58 PM

## 2019-10-10 ENCOUNTER — Encounter: Payer: Medicare PPO | Admitting: Registered Nurse

## 2019-10-11 ENCOUNTER — Encounter: Payer: Medicare PPO | Attending: Registered Nurse | Admitting: Registered Nurse

## 2019-10-11 ENCOUNTER — Other Ambulatory Visit: Payer: Self-pay

## 2019-10-11 VITALS — BP 107/62 | HR 81 | Temp 96.0°F | Ht 63.0 in | Wt 166.0 lb

## 2019-10-11 DIAGNOSIS — N319 Neuromuscular dysfunction of bladder, unspecified: Secondary | ICD-10-CM | POA: Insufficient documentation

## 2019-10-11 DIAGNOSIS — M792 Neuralgia and neuritis, unspecified: Secondary | ICD-10-CM | POA: Diagnosis not present

## 2019-10-11 DIAGNOSIS — G8250 Quadriplegia, unspecified: Secondary | ICD-10-CM | POA: Diagnosis not present

## 2019-10-11 DIAGNOSIS — K592 Neurogenic bowel, not elsewhere classified: Secondary | ICD-10-CM | POA: Diagnosis not present

## 2019-10-11 DIAGNOSIS — Z981 Arthrodesis status: Secondary | ICD-10-CM

## 2019-10-11 NOTE — Progress Notes (Signed)
Subjective:    Patient ID: Meredith Roach, female    DOB: 02/04/1941, 79 y.o.   MRN: [id number]  HPI: Meredith Roach is a 79 y.o. female who is being seen by My-Chart Video for Transitional care visit for follow up of her Quadriplegia, S/P Cervical Spine fusion, Neuropathic Pain, Neurogenic Bladder and Neurogenic Bowel. Also being seen for face to face Video visit for Wheelchair Evaluation, we will be ordering a manual tilt in space high-backed wheelchair with manual elevating leg rests. Meredith Roach needs the above wheelchair due to her being an incomplete quadriplegic/tetraplegic. Meredith Roach doesn't have the ability to do her own pressure relief to prevent pressure ulcers, her family plans on doing the pressure relief every 20-30 minutes with manual tilt in space feature. Also Meredith Roach has had difficulty with hypotension and orthostatic hypotension, so she needs the elevating leg rests to help with this issue and with her occasional lower extremity edema, so her legs can be elevated when the need arises. Meredith Roach needs total assistance for transfers and this wheelchair is the only way she will be able to be mobilized in any setting, since she doesn't have the upper body strength to push a manual wheelchair. We will also like to order a J-2  Gel cushion, this will reduce the risk of pressure ulcers.   Meredith Roach was brought to [facility] via EMS on 08/26/2019.she reported she was out gardening when she fell ans wasn't able to move any of her extremities. Neuro-surgery was consulted.  CT Head WO Contrast: IMPRESSION: HEAD CT: CT Cervicl Spine  1. No intracranial abnormality.  CERVICAL CT  1. No fracture or acute finding. 2. Advanced degenerative changes. Moderate central spinal stenosis due to spondylotic disc bulging at C3-C4.  MRI CERVICAL SPINE FINDINGS  Alignment: Minimal retrolisthesis of C3 on C4 and minimal anterolisthesis of C4 on C5.  Vertebrae: No  fracture, suspicious osseous lesion, or significant marrow edema.  Cord: Multifocal T2 hyperintensity in the spinal cord from C3-4 to C6-7 associated with spinal stenosis at these levels with the signal abnormality being greatest at C3-4 and compatible with myelomalacia.  Posterior Fossa, vertebral arteries, paraspinal tissues: Unremarkable.  Disc levels: The cervical spinal canal is small in caliber diffusely on a congenital basis.  C2-3: Disc bulging, a right paracentral disc osteophyte complex, and mild right facet arthrosis result in mild spinal stenosis without significant neural foraminal stenosis.  C3-4: Disc bulging, a broad posterior disc protrusion, and uncovertebral spurring result in severe spinal stenosis with moderate to severe cord compression and severe left greater than right neural foraminal stenosis.  C4-5: A broad right central disc protrusion, disc bulging, uncovertebral spurring, and moderate right facet arthrosis result in severe spinal stenosis with moderate cord compression and severe right neural foraminal stenosis.  C5-6: Disc bulging, infolding of the ligamentum flavum, and uncovertebral spurring result in severe spinal stenosis with severe cord compression and severe right and moderate left neural foraminal stenosis.  C6-7: Disc bulging, infolding of the ligamentum flavum, and uncovertebral spurring result in severe spinal stenosis with mild cord compression and severe bilateral neural foraminal stenosis.  C7-T1: Minimal disc bulging and moderate right and mild left facet arthrosis without stenosis.  T1-2: Mild facet arthrosis without disc herniation or stenosis.  MRI Cervical Spine:  1. 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 2. Severe spinal stenosis from C3-4 to C6-7 with up to severe cord compression and multifocal myelomalacia.  Meredith Roach underwent ANTERIOR CERVICAL  DECOMPRESSION/DISCECTOMY FUSION  CERVICALTHREE-FOUR CERVICAL,FOUR-FIVE,CERVICAL FIVE-SIX,CERVICAL SIX-SEVEN.By Dr Ronnald Ramp on 08/28/2019.  Meredith Roach was admitted to Inpatient Rehabilitation on 08/2019 and discharged home on 09/29/2019. She is receiving Home Health Therapy with [facility]. She states she has pain in her bilateral hands and describes her pain as tingling and numbness. She rates her pain 7. Also reports she has a good appetite.    Pain Inventory Average Pain 10 Pain Right Now 7 My pain is tingling and cold  In the last 24 hours, has pain interfered with the following? General activity 0 Relation with others 0 Enjoyment of life 0 What TIME of day is your pain at its worst? evening and night Sleep (in general) Good  Pain is worse with: unsure Pain improves with: none Relief from Meds: na  Mobility use a wheelchair needs help with transfers  Function retired I need assistance with the following:  feeding, dressing, bathing, toileting, meal prep, household duties and shopping  Neuro/Psych weakness numbness tingling  Prior Studies Any changes since last visit?  no  Physicians involved in your care Any changes since last visit?  no   Family History  Problem Relation Age of Onset  . Breast cancer Maternal Aunt   . Breast cancer Paternal Aunt   . CAD Father   . Heart attack Father    Social History   Socioeconomic History  . Marital status: Married    Spouse name: [name]  . Number of children: 2  . Years of education: Not on file  . Highest education level: Not on file  Occupational History  . Not on file  Tobacco Use  . Smoking status: Never Smoker  . Smokeless tobacco: Never Used  Vaping Use  . Vaping Use: Never used  Substance and Sexual Activity  . Alcohol use: No  . Drug use: No  . Sexual activity: Not Currently  Other Topics Concern  . Not on file  Social History Narrative  . Not on file   Social Determinants of Health   Financial Resource Strain:   . Difficulty of  Paying Living Expenses:   Food Insecurity:   . Worried About Charity fundraiser in the Last Year:   . Arboriculturist in the Last Year:   Transportation Needs:   . Film/video editor (Medical):   Marland Kitchen Lack of Transportation (Non-Medical):   Physical Activity:   . Days of Exercise per Week:   . Minutes of Exercise per Session:   Stress:   . Feeling of Stress :   Social Connections:   . Frequency of Communication with Friends and Family:   . Frequency of Social Gatherings with Friends and Family:   . Attends Religious Services:   . Active Member of Clubs or Organizations:   . Attends Archivist Meetings:   Marland Kitchen Marital Status:    Past Surgical History:  Procedure Laterality Date  . ANTERIOR CERVICAL DECOMPRESSION/DISCECTOMY FUSION 4 LEVELS N/A 08/28/2019   Procedure: ANTERIOR CERVICAL DECOMPRESSION/DISCECTOMY FUSION CERVICALTHREE-FOUR CERVICAL,FOUR-FIVE,CERVICAL FIVE-SIX,CERVICAL SIX-SEVEN.;  Surgeon: [name], MD;  Location: [place];  Service: Neurosurgery;  Laterality: N/A;  ANTERIOR  . BACK SURGERY    . CATARACT EXTRACTION W/PHACO Right 03/31/2018   Procedure: CATARACT EXTRACTION PHACO AND INTRAOCULAR LENS PLACEMENT ([place]);  Surgeon: [name], MD;  Location: [facility];  Service: Ophthalmology;  Laterality: Right;  [place] 00:39.7 CDE 4.35 Fluid Pack Lot # [id number]  . CHOLECYSTECTOMY  1995  . COLONOSCOPY WITH PROPOFOL N/A 06/07/2017  Procedure: COLONOSCOPY WITH PROPOFOL;  Surgeon: [name], MD;  [facility] ENDOSCOPY;  Service: Endoscopy;  Laterality: N/A;  . ESOPHAGOGASTRODUODENOSCOPY (EGD) WITH PROPOFOL N/A 06/07/2017   Procedure: ESOPHAGOGASTRODUODENOSCOPY (EGD) WITH PROPOFOL;  Surgeon: [name], MD;  Location: [facility] ENDOSCOPY;  Service: Endoscopy;  Laterality: N/A;  . REDUCTION MAMMAPLASTY Bilateral 1994   Past Medical History:  Diagnosis Date  . Arthritis   . GERD (gastroesophageal reflux disease)   . Gout   . Neuromuscular disorder ([place])    . Pre-diabetes    BP 107/62 Comment: pt reported, my chart video  Pulse 81 Comment: pt reported, my chart video  Temp (!) 96 F (35.6 C) Comment: pt reported, my chart video  Ht 5\' 3"  (1.6 m) Comment: pt reported, my chart video  Wt 166 lb (75.3 kg) Comment: pt reported, my chart video  SpO2 99% Comment: pt reported, my chart video  BMI 29.41 kg/m   Opioid Risk Score:   Fall Risk Score:  `1  Depression screen PHQ 2/9  No flowsheet data found.  Review of Systems  Neurological: Positive for weakness and numbness.  All other systems reviewed and are negative.      Objective:   Physical Exam Vitals and nursing note reviewed.  Musculoskeletal:     Comments: No Physical Examine Performed: My-Chart Video Visit           Assessment & Plan:  1. Quadriplegia: LS:[id number] tilt in space high-backed wheelchair with manual elevating leg rests.  S/P Cervical Spine fusion, Continue Home Health Therapy. She is scheduled for HFU appointment with Neurosurgery Dr. Ronnald Ramp., 2. Neuropathic Pain: Continue current medication regimen with Gabapentin. Continue to Monitor.  3. Neurogenic Bladder: Foley catheter intact. Continue to monitor.  4.Neurogenic Bowel: Continue with Bowel Program. Continue to Monitor.   My-Chart Video:  Transitional Care Visit/ Face to Face Wheelchair Evaluation New Patient Location of Patient: In Her Home Location of Provider: In the Office  Total Time Spent: 30 Minutes  F/U with Dr Dagoberto Ligas in 4- 6 weeks

## 2019-10-18 ENCOUNTER — Telehealth: Payer: Self-pay

## 2019-10-18 ENCOUNTER — Encounter: Payer: Self-pay | Admitting: Registered Nurse

## 2019-10-18 NOTE — Telephone Encounter (Addendum)
[facility] called for an update on the pending paperwork for a wheelchair.  Meredith Roach is aware according the note on yesterday paperwork is in progress.  Apology for delay given.

## 2019-10-26 ENCOUNTER — Other Ambulatory Visit: Payer: Self-pay | Admitting: Physical Medicine and Rehabilitation

## 2019-11-03 ENCOUNTER — Telehealth: Payer: Self-pay

## 2019-11-03 NOTE — Telephone Encounter (Signed)
Verbal order okay given to [facility]. Physical therapy twice a week for one week then once a week for 3 weeks.

## 2019-11-21 ENCOUNTER — Other Ambulatory Visit: Payer: Self-pay | Admitting: Physical Medicine and Rehabilitation

## 2019-11-27 ENCOUNTER — Other Ambulatory Visit: Payer: Self-pay

## 2019-11-27 ENCOUNTER — Encounter: Payer: Medicare PPO | Attending: Registered Nurse | Admitting: Physical Medicine and Rehabilitation

## 2019-11-27 ENCOUNTER — Encounter: Payer: Self-pay | Admitting: Physical Medicine and Rehabilitation

## 2019-11-27 VITALS — BP 101/66 | HR 81 | Temp 98.3°F | Ht 63.0 in | Wt 166.0 lb

## 2019-11-27 DIAGNOSIS — K592 Neurogenic bowel, not elsewhere classified: Secondary | ICD-10-CM | POA: Insufficient documentation

## 2019-11-27 DIAGNOSIS — G8250 Quadriplegia, unspecified: Secondary | ICD-10-CM | POA: Diagnosis not present

## 2019-11-27 DIAGNOSIS — N319 Neuromuscular dysfunction of bladder, unspecified: Secondary | ICD-10-CM | POA: Insufficient documentation

## 2019-11-27 DIAGNOSIS — R252 Cramp and spasm: Secondary | ICD-10-CM

## 2019-11-27 DIAGNOSIS — Z981 Arthrodesis status: Secondary | ICD-10-CM | POA: Insufficient documentation

## 2019-11-27 DIAGNOSIS — M792 Neuralgia and neuritis, unspecified: Secondary | ICD-10-CM | POA: Diagnosis not present

## 2019-11-27 DIAGNOSIS — Z993 Dependence on wheelchair: Secondary | ICD-10-CM | POA: Insufficient documentation

## 2019-11-27 MED ORDER — DULOXETINE HCL 60 MG PO CPEP: 60.0000 mg | ORAL_CAPSULE | Freq: Every day | ORAL | 5 refills | Status: DC

## 2019-11-27 MED ORDER — GABAPENTIN 300 MG PO CAPS: 300.0000 mg | ORAL_CAPSULE | Freq: Three times a day (TID) | ORAL | 5 refills | Status: DC

## 2019-11-27 NOTE — Progress Notes (Addendum)
Subjective:    Patient ID: Meredith Roach, female    DOB: 31-Oct-1940, 79 y.o.   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   At home  Bowels pretty regular at night- so not doing the bowel program. Did when she first went home, but hasn't needed the additional help of bowel program- ~ 1 month.   Has foley catheter- daughter changing foley 1x/month.   Some days , has significant sediment.   Using cera steady-  Got online- used- makes it so easy to get her moved.  Using for therapy.   Goes to bathroom on it to get on [facility] originally and now riser to sit on toilet.  Uses steady's arm rests.   Has hospital bed- set up in her bedroom.  Gel overlay mattress.   Manual w/c- just got today- her permanent w/c.  Fits her well, but cannot gain weight.   W/C folds up and fits in the back of the SUV- Transferred into the car- the disc that turns- using gait belt- can stand if holding on- can scoot bottom back.   Uses sacral pad on backside and since got home, has been trying to lean back every 20-30 minutes on manual w/c. Family has to do it for her.   Other adaptive equipment Built up handles on utensils- still feeding self.    Nerve pain Gets cold and numb- mainly L>R hands- R hand has gotten somewhat better.   Gabapentin was increased to 300 mg BID last week- already also on Duloxetine 30 mg for mood.  Willing to increase   Hair falling out in handfuls in the last 3 weeks.  Doesn't have thyroid disease.            Pain Inventory Average Pain 5 Pain Right Now 5 My pain is tingling and aching  LOCATION OF PAIN  Left hand  BOWEL Number of stools per week: 6 Oral laxative use Yes  Type of laxative senekot/ suppository Enema or suppository use Yes  History of colostomy No  Incontinent No   BLADDER Foley In and  out cath, frequency na Able to self cath na Bladder incontinence na Frequent urination na Leakage with coughing No  Difficulty starting stream No  Incomplete bladder emptying No    Mobility use a wheelchair needs help with transfers  Function retired  Neuro/Psych numbness tingling  Prior Studies Any changes since last visit?  no  Physicians involved in your care Any changes since last visit?  no   Family History  Problem Relation Age of Onset  . Breast cancer Maternal Aunt   . Breast cancer Paternal Aunt   . CAD Father   . Heart attack Father    Social History   Socioeconomic History  . Marital status: Married    Spouse name: [name]  . Number of children: 2  . Years of education: Not on file  . Highest education level: Not on file  Occupational History  . Not on file  Tobacco Use  . Smoking status: Never Smoker  . Smokeless tobacco: Never Used  Vaping Use  . Vaping Use: Never used  Substance and Sexual Activity  . Alcohol use: No  . Drug use: No  . Sexual activity: Not Currently  Other Topics Concern  . Not on file  Social History  Narrative  . Not on file   Social Determinants of Health   Financial Resource Strain:   . Difficulty of Paying Living Expenses:   Food Insecurity:   . Worried About Charity fundraiser in the Last Year:   . Arboriculturist in the Last Year:   Transportation Needs:   . Film/video editor (Medical):   Marland Kitchen Lack of Transportation (Non-Medical):   Physical Activity:   . Days of Exercise per Week:   . Minutes of Exercise per Session:   Stress:   . Feeling of Stress :   Social Connections:   . Frequency of Communication with Friends and Family:   . Frequency of Social Gatherings with Friends and Family:   . Attends Religious Services:   . Active Member of Clubs or Organizations:   . Attends Archivist Meetings:   Marland Kitchen Marital Status:    Past Surgical History:  Procedure Laterality Date  . ANTERIOR CERVICAL  DECOMPRESSION/DISCECTOMY FUSION 4 LEVELS N/A 08/28/2019   Procedure: ANTERIOR CERVICAL DECOMPRESSION/DISCECTOMY FUSION CERVICALTHREE-FOUR CERVICAL,FOUR-FIVE,CERVICAL FIVE-SIX,CERVICAL SIX-SEVEN.;  Surgeon: [name], MD;  Location: [place];  Service: Neurosurgery;  Laterality: N/A;  ANTERIOR  . BACK SURGERY    . CATARACT EXTRACTION W/PHACO Right 03/31/2018   Procedure: CATARACT EXTRACTION PHACO AND INTRAOCULAR LENS PLACEMENT ([place]);  Surgeon: [name], MD;  Location: [facility];  Service: Ophthalmology;  Laterality: Right;  [place] 00:39.7 CDE 4.35 Fluid Pack Lot # [id number]  . CHOLECYSTECTOMY  1995  . COLONOSCOPY WITH PROPOFOL N/A 06/07/2017   Procedure: COLONOSCOPY WITH PROPOFOL;  Surgeon: [name], MD;  Location: [facility] ENDOSCOPY;  Service: Endoscopy;  Laterality: N/A;  . ESOPHAGOGASTRODUODENOSCOPY (EGD) WITH PROPOFOL N/A 06/07/2017   Procedure: ESOPHAGOGASTRODUODENOSCOPY (EGD) WITH PROPOFOL;  Surgeon: [name], MD;  Location: [facility] ENDOSCOPY;  Service: Endoscopy;  Laterality: N/A;  . REDUCTION MAMMAPLASTY Bilateral 1994   Past Medical History:  Diagnosis Date  . Arthritis   . GERD (gastroesophageal reflux disease)   . Gout   . Neuromuscular disorder ([place])   . Pre-diabetes    BP 101/66   Pulse 81   Temp 98.3 F (36.8 C)   Ht 5\' 3"  (1.6 m)   Wt 166 lb (75.3 kg) Comment: last recorded  SpO2 97%   BMI 29.41 kg/m   Opioid Risk Score:   Fall Risk Score:  `1  Depression screen PHQ 2/9  Depression screen [facility] 2/9 11/27/2019 10/11/2019  Decreased Interest 0 0  Down, Depressed, Hopeless 0 0  PHQ - 2 Score 0 0    Review of Systems  Constitutional: Negative.   HENT: Negative.   Eyes: Negative.   Respiratory: Negative.   Cardiovascular: Negative.   Gastrointestinal: Negative.   Endocrine: Negative.   Genitourinary:       Foley  Musculoskeletal: Positive for gait problem.  Skin: Negative.   Allergic/Immunologic: Negative.   Neurological: Positive for numbness.        Tingling  Hematological: Negative.   Psychiatric/Behavioral: Negative.   All other systems reviewed and are negative.      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  1. Can try vinegar dilute saline solution- 1 tablespoon of white vinegar and 1 L of Saline.  And then decant ~60-100 cc of solution into bladder- can then close off foley (hair clip) for 15-20 minutes- then let it out- helps sediment buildup. Most of every 1-2 weeks.   2. Be careful about scooting- can get shear injuries.  Do pressure relief every 20 to 30 minutes up in w/c.    3. Increase Duloxetine to 60 mg nightly- for nerve pain- increase FIRST  4. D/C Flomax- Tamsulosin- doesn't need anymore.   5. If hair loss doesn't improve, get PCP to check TSH/free T4 and some of the Vitamin B levels,  Vitamin D levels- common in hair loss. Likely due to trauma of SCI, but just in case.   6. Increase gabapentin 300 mg 3x/day in ~ 3 days x 1 week, then 600 mg 2x/day- for nerve pain.   7. Increase Duloxetine then 3 days later, increase gabapentin, That way, if has side effects, we know what it's coming from.   8. Won't start spasticity meds right now- because is mild- will monitor!!! Call me if gets worse abruptly.   9. F/U in 6-8 weeks. Double appointment.   I spent a total of 35 minutes on visit- as detailed above    Pt needs elevating leg rests- for manual w/c- due to LE edema-  already discussed reasons and justifications for manual w/c. Just added elevating leg rests.

## 2019-11-27 NOTE — Patient Instructions (Signed)
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  1. Can try vinegar dilute saline solution- 1 tablespoon of white vinegar and 1 L of Saline.  And then decant ~60-100 cc of solution into bladder- can then close off foley (hair clip) for 15-20 minutes- then let it out- helps sediment buildup. Most of every 1-2 weeks.   2. Be careful about scooting- can get shear injuries.  Do pressure relief every 20 to 30 minutes up in w/c.    3. Increase Duloxetine to 60 mg nightly- for nerve pain- increase FIRST  4. D/C Flomax- Tamsulosin- doesn't need anymore.   5. If hair loss doesn't improve, get PCP to check TSH/free T4 and some of the Vitamin B levels,  Vitamin D levels- common in hair loss. Likely due to trauma of SCI, but just in case.   6. Increase gabapentin 300 mg 3x/day in ~ 3 days x 1 week, then 600 mg 2x/day- for nerve pain.   7. Increase Duloxetine then 3 days later, increase gabapentin, That way, if has side effects, we know what it's coming from.   8. Won't start spasticity meds right now- because is mild- will monitor!!! Call me if gets worse abruptly.   9. F/U in 6-8 weeks. Double appointment.

## 2019-12-08 ENCOUNTER — Telehealth: Payer: Self-pay | Admitting: Physical Medicine and Rehabilitation

## 2019-12-08 DIAGNOSIS — G8250 Quadriplegia, unspecified: Secondary | ICD-10-CM

## 2019-12-08 NOTE — Telephone Encounter (Signed)
Placed H/H nursing, PT and OT order

## 2019-12-14 ENCOUNTER — Telehealth: Payer: Self-pay | Admitting: *Deleted

## 2019-12-14 NOTE — Telephone Encounter (Signed)
Bill PT with [facility] called for PT POC 2wk5, 1wk4.  Approval given.

## 2019-12-28 ENCOUNTER — Other Ambulatory Visit: Payer: Self-pay | Admitting: Physical Medicine and Rehabilitation

## 2019-12-29 ENCOUNTER — Telehealth: Payer: Self-pay | Admitting: *Deleted

## 2019-12-29 NOTE — Telephone Encounter (Signed)
It looks like she has completed the 3 month course of the Lovenox so she no longer needs it- please cancel script, thank you!

## 2019-12-29 NOTE — Telephone Encounter (Signed)
Crystal from [facility] called to get clarification on the Lovenox order. She needed to verify the day/supply. It looks like it was a 75 day supply. Is this correct? (30 ml/0.72ml)

## 2019-12-29 NOTE — Telephone Encounter (Signed)
Pharmacy notified and Lovenox discontinued.

## 2020-01-01 ENCOUNTER — Other Ambulatory Visit: Payer: Self-pay | Admitting: Physical Medicine and Rehabilitation

## 2020-01-03 ENCOUNTER — Other Ambulatory Visit: Payer: Self-pay

## 2020-01-10 ENCOUNTER — Telehealth: Payer: Self-pay | Admitting: *Deleted

## 2020-01-10 NOTE — Telephone Encounter (Signed)
[facility] OT called to extend POC 1wk4.  Approval given.

## 2020-01-15 ENCOUNTER — Other Ambulatory Visit: Payer: Self-pay

## 2020-01-15 ENCOUNTER — Encounter: Payer: Medicare PPO | Attending: Registered Nurse | Admitting: Physical Medicine and Rehabilitation

## 2020-01-15 ENCOUNTER — Encounter: Payer: Self-pay | Admitting: Physical Medicine and Rehabilitation

## 2020-01-15 VITALS — BP 116/75 | HR 80 | Temp 98.7°F | Ht 63.0 in | Wt 166.0 lb

## 2020-01-15 DIAGNOSIS — R252 Cramp and spasm: Secondary | ICD-10-CM | POA: Diagnosis present

## 2020-01-15 DIAGNOSIS — M792 Neuralgia and neuritis, unspecified: Secondary | ICD-10-CM

## 2020-01-15 DIAGNOSIS — Z993 Dependence on wheelchair: Secondary | ICD-10-CM | POA: Diagnosis not present

## 2020-01-15 DIAGNOSIS — G8250 Quadriplegia, unspecified: Secondary | ICD-10-CM | POA: Diagnosis present

## 2020-01-15 DIAGNOSIS — N319 Neuromuscular dysfunction of bladder, unspecified: Secondary | ICD-10-CM | POA: Diagnosis present

## 2020-01-15 MED ORDER — GABAPENTIN 600 MG PO TABS: 600.0000 mg | ORAL_TABLET | Freq: Three times a day (TID) | ORAL | 5 refills | Status: DC

## 2020-01-15 NOTE — Patient Instructions (Signed)
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   1. Wait on spasticity/spasms meds- because it's minor right now - spasticity gets worse for up to 1-2 years past injury and only want to treat if an issue.   2. See if possible to get Estim use of LUE/LLE in [place] therapies- if NOT, once done with H/H therapies, will see if can send to OUTPATIENT PT and OT and add in Estim for L side.  Went over usage and how to works and how it might - "jump start the muscles".  They need to call me and I can give the verbal order, or can't do it.     3. Con't foley- if starts having leakage, or lot of UTIs, etc, will send to Urology- wait for now.    4. Increase Gabapentin at night time- 600 mg in AM and 1200 mg nightly- will send in 600 mg tablets, so easier to take. For nerve pain.   5. Continue Duloxetine.   6. Continue Baclofen 10 mg 3x/day- for spasticity/spasms. Can take up to another 10 mg/day as needed if gets an infection (I can refill early).   7. F/U in 3 months-  Double visit.SCI

## 2020-01-15 NOTE — Progress Notes (Signed)
Subjective:    Patient ID: Meredith Roach, female    DOB: [birth year], 79 y.o.   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    Standing with therapy now- trying to take a step or two.   Nerve pain- just now in L hand-hasn't gotten much better in L hand unfortunately, but much better in R hand now.   No skin breakdown on backside.   Hair loss not as bad, but still noticeable- is actually coming back now- dark and curly.   Has spasms every once in awhile- not bad though- not painful.  Not real bothersome.  Spasms max 2-3x/day- LUE does it more than LLE sometimes, but somedays doesn't occur at all.   Getting some movement more in R side/R hand than in L side, a LITTLE more in L side/L hand than before, but more noticeable on R.  Can shuffle her feet more when standing.    Still off the Flomax- has foley.  Doesn't need Flomax anymore. Changed by daughter 1x/month.    Going on her own- doesn't need bowel program anymore.  Not constipated!   Has tried Estim- in acute, but not since.  Still getting H/H therapy-   No UTIs since last seen her.       Pain Inventory Average Pain 5 Pain Right Now 3 My pain is intermittent, constant, burning, tingling and aching  In the last 24 hours, has pain interfered with the following? General activity 3 Relation with others 0 Enjoyment of life 0 What TIME of day is your pain at its worst? evening Sleep (in general) Good  Pain is worse with: cold & achy in left hand all the time other pains comes & goes.. Pain improves with: rest and warmth Relief from Meds: No change with medication.  Family History  Problem Relation Age of Onset  . Breast cancer Maternal Aunt   . Breast cancer Paternal Aunt   . CAD Father   . Heart  attack Father    Social History   Socioeconomic History  . Marital status: Married    Spouse name: [name]  . Number of children: 2  . Years of education: Not on file  . Highest education level: Not on file  Occupational History  . Not on file  Tobacco Use  . Smoking status: Never Smoker  . Smokeless tobacco: Never Used  Vaping Use  . Vaping Use: Never used  Substance and Sexual Activity  . Alcohol use: No  . Drug use: No  . Sexual activity: Not Currently  Other Topics Concern  . Not on file  Social History Narrative  . Not on file   Social Determinants of Health   Financial Resource Strain:   . Difficulty of Paying Living Expenses: Not on file  Food Insecurity:   . Worried About Charity fundraiser in the Last Year: Not on file  . Ran Out of Food in the Last Year: Not on file  Transportation Needs:   . Lack of Transportation (Medical): Not on file  . Lack of Transportation (Non-Medical): Not on file  Physical Activity:   . Days of Exercise per Week: Not on file  . Minutes of Exercise per Session: Not on file  Stress:   .  Feeling of Stress : Not on file  Social Connections:   . Frequency of Communication with Friends and Family: Not on file  . Frequency of Social Gatherings with Friends and Family: Not on file  . Attends Religious Services: Not on file  . Active Member of Clubs or Organizations: Not on file  . Attends Archivist Meetings: Not on file  . Marital Status: Not on file   Past Surgical History:  Procedure Laterality Date  . ANTERIOR CERVICAL DECOMPRESSION/DISCECTOMY FUSION 4 LEVELS N/A 08/28/2019   Procedure: ANTERIOR CERVICAL DECOMPRESSION/DISCECTOMY FUSION CERVICALTHREE-FOUR CERVICAL,FOUR-FIVE,CERVICAL FIVE-SIX,CERVICAL SIX-SEVEN.;  Surgeon: [name], MD;  Location: [place];  Service: Neurosurgery;  Laterality: N/A;  ANTERIOR  . BACK SURGERY    . CATARACT EXTRACTION W/PHACO Right 03/31/2018   Procedure: CATARACT EXTRACTION PHACO AND  INTRAOCULAR LENS PLACEMENT ([place]);  Surgeon: [name], MD;  Location: [facility];  Service: Ophthalmology;  Laterality: Right;  [place] 00:39.7 CDE 4.35 Fluid Pack Lot # [id number]  . CHOLECYSTECTOMY  1995  . COLONOSCOPY WITH PROPOFOL N/A 06/07/2017   Procedure: COLONOSCOPY WITH PROPOFOL;  Surgeon: [name], MD;  Location: [facility] ENDOSCOPY;  Service: Endoscopy;  Laterality: N/A;  . ESOPHAGOGASTRODUODENOSCOPY (EGD) WITH PROPOFOL N/A 06/07/2017   Procedure: ESOPHAGOGASTRODUODENOSCOPY (EGD) WITH PROPOFOL;  Surgeon: [name], MD;  Location: [facility] ENDOSCOPY;  Service: Endoscopy;  Laterality: N/A;  . REDUCTION MAMMAPLASTY Bilateral 1994   Past Surgical History:  Procedure Laterality Date  . ANTERIOR CERVICAL DECOMPRESSION/DISCECTOMY FUSION 4 LEVELS N/A 08/28/2019   Procedure: ANTERIOR CERVICAL DECOMPRESSION/DISCECTOMY FUSION CERVICALTHREE-FOUR CERVICAL,FOUR-FIVE,CERVICAL FIVE-SIX,CERVICAL SIX-SEVEN.;  Surgeon: [name], MD;  Location: [place];  Service: Neurosurgery;  Laterality: N/A;  ANTERIOR  . BACK SURGERY    . CATARACT EXTRACTION W/PHACO Right 03/31/2018   Procedure: CATARACT EXTRACTION PHACO AND INTRAOCULAR LENS PLACEMENT ([place]);  Surgeon: [name], MD;  Location: [facility];  Service: Ophthalmology;  Laterality: Right;  [place] 00:39.7 CDE 4.35 Fluid Pack Lot # [id number]  . CHOLECYSTECTOMY  1995  . COLONOSCOPY WITH PROPOFOL N/A 06/07/2017   Procedure: COLONOSCOPY WITH PROPOFOL;  Surgeon: [name], MD;  Location: [facility] ENDOSCOPY;  Service: Endoscopy;  Laterality: N/A;  . ESOPHAGOGASTRODUODENOSCOPY (EGD) WITH PROPOFOL N/A 06/07/2017   Procedure: ESOPHAGOGASTRODUODENOSCOPY (EGD) WITH PROPOFOL;  Surgeon: [name], MD;  Location: [facility] ENDOSCOPY;  Service: Endoscopy;  Laterality: N/A;  . REDUCTION MAMMAPLASTY Bilateral 1994   Past Medical History:  Diagnosis Date  . Arthritis   . GERD (gastroesophageal reflux disease)   . Gout   . Neuromuscular disorder ([place])   .  Pre-diabetes    BP 116/75   Pulse 80   Temp 98.7 F (37.1 C)   Ht 5\' 3"  (1.6 m)   Wt 166 lb (75.3 kg) Comment: last wt in 11/2019  SpO2 97%   BMI 29.41 kg/m   Opioid Risk Score:   Fall Risk Score:  `1  Depression screen PHQ 2/9  Depression screen [facility] 2/9 11/27/2019 10/11/2019  Decreased Interest 0 0  Down, Depressed, Hopeless 0 0  PHQ - 2 Score 0 0   Review of Systems  Constitutional: Negative.   HENT: Negative.   Eyes: Negative.   Respiratory: Negative.   Cardiovascular: Negative.   Gastrointestinal: Negative.   Endocrine: Negative.   Genitourinary: Negative.   Musculoskeletal: Positive for gait problem.       Hands ache  Skin: Negative.   Neurological: Positive for weakness.       Weakness in hands  Psychiatric/Behavioral: Negative.  Depression  All other systems reviewed and are negative.      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   1. Wait on spasticity/spasms meds- because it's minor right now - spasticity gets worse for up to 1-2 years past injury and only want to treat if an issue.   2. See if possible to get Estim use of LUE/LLE in [place] therapies- if NOT, once done with H/H therapies, will see if can send to OUTPATIENT PT and OT and add in Estim for L side.  Went over usage and how to works and how it might - "jump start the muscles".  They need to call me and I can give the verbal order, or can't do it.     3. Con't foley- if  starts having leakage, or lot of UTIs, etc, will send to Urology- wait for now.    4. Increase Gabapentin at night time- 600 mg in AM and 1200 mg nightly- will send in 600 mg tablets, so easier to take. For nerve pain.   5. Continue Duloxetine.   6. Continue Baclofen 10 mg 3x/day- for spasticity/spasms. Can take up to another 10 mg/day as needed if gets an infection (I can refill early).   7. F/U in 3 months-  Double visit.SCI   I spent a total of 35 minutes on visit - as detailed above.

## 2020-02-07 ENCOUNTER — Telehealth: Payer: Self-pay | Admitting: *Deleted

## 2020-02-07 NOTE — Telephone Encounter (Signed)
Bill PT called for recert of PT POC [id number].  Approval given.

## 2020-03-21 NOTE — Progress Notes (Signed)
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  After her fall, she underwent emergent cervical decompression.  After prolonged hospital stay, she was in rehab at [facility] for a month and now finally living at home with assistance.  Her daughter is a Marine scientist who accompanies her today.  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  She also has neurogenic bowel, previously was on aggressive bowel regimen but now is having normal bowel movements without this.  She is able to take a few steps and continues to improve.  Initially, her bladder is being maintained with CIC.  At the time of discharge from rehab, Foley catheter was placed which being changed monthly.  She has had multiple voiding trials which she is failed with documented incomplete bladder emptying.  She did try Flomax for about a month which did not improve her ability to empty her bladder.  No issues with infections.  She does have some concern about urethral erosion, etc.  She is interested today in discussing SP tube placement.   PMH: Past Medical History:  Diagnosis Date   Arthritis    GERD (gastroesophageal reflux disease)    Gout    Neuromuscular disorder ([place])    Pre-diabetes     Surgical History: Past Surgical History:  Procedure Laterality Date   ANTERIOR CERVICAL DECOMPRESSION/DISCECTOMY FUSION 4 LEVELS N/A 08/28/2019   Procedure: ANTERIOR CERVICAL DECOMPRESSION/DISCECTOMY FUSION CERVICALTHREE-FOUR CERVICAL,FOUR-FIVE,CERVICAL FIVE-SIX,CERVICAL SIX-SEVEN.;  Surgeon: [name], MD;  Location: [place];  Service: Neurosurgery;   Laterality: N/A;  ANTERIOR   BACK SURGERY     CATARACT EXTRACTION W/PHACO Right 03/31/2018   Procedure: CATARACT EXTRACTION PHACO AND INTRAOCULAR LENS PLACEMENT ([place]);  Surgeon: [name], MD;  Location: [facility] ORS;  Service: Ophthalmology;  Laterality: Right;  [place] 00:39.7 CDE 4.35 Fluid Pack Lot # [id number]   CHOLECYSTECTOMY  1995   COLONOSCOPY WITH PROPOFOL N/A 06/07/2017   Procedure: COLONOSCOPY WITH PROPOFOL;  Surgeon: [name], MD;  [facility] END[facility]COPY;  Service: Endoscopy;  Laterality: N/A;   ESOPHAGOGASTRODUODENOSCOPY (EGD) WITH PROPOFOL N/A 06/07/2017   Procedure: ESOPHAGOGASTRODUODENOSCOPY (EGD) WITH PROPOFOL;  Surgeon: [name], MD;  Location: [facility] ENDOSCOPY;  Service: Endoscopy;  Laterality: N/A;   REDUCTION MAMMAPLASTY Bilateral 1994    Home Medications:  Allergies as of 03/22/2020      Reactions   Ace Inhibitors Anaphylaxis      Medication List       Accurate as of March 22, 2020 11:59 PM. If you have any questions, ask your nurse or doctor.        STOP taking these medications   bisacodyl 10 MG suppository Commonly known as: DULCOLAX Stopped by: Hollice Espy, MD   tamsulosin 0.4 MG Caps capsule Commonly known as: FLOMAX Stopped by: Hollice Espy, MD     TAKE these medications   baclofen 10 MG tablet Commonly known as: LIORESAL TAKE 1 TABLET BY MOUTH THREE TIMES DAILY   DULoxetine 60 MG capsule Commonly known as: Cymbalta Take 1 capsule (60 mg total) by mouth at bedtime.   gabapentin 600 MG tablet Commonly known as: Neurontin Take 1 tablet (600 mg total) by  mouth 3 (three) times daily. For nerve pain   omeprazole 40 MG capsule Commonly known as: PRILOSEC Take 40 mg by mouth at bedtime.   senna 8.6 MG Tabs tablet Commonly known as: SENOKOT Take 2 tablets (17.2 mg total) by mouth daily.       Allergies:  Allergies  Allergen Reactions   Ace Inhibitors Anaphylaxis    Family History: Family History  Problem  Relation Age of Onset   Breast cancer Maternal Aunt    Breast cancer Paternal Aunt    CAD Father    Heart attack Father     Social History:  reports that she has never smoked. She has never used smokeless tobacco. She reports that she does not drink alcohol and does not use drugs.   Physical Exam: BP 106/66    Pulse 85    Ht 5\' 3"  (1.6 m)    Wt 180 lb (81.6 kg)    BMI 31.89 kg/m   Constitutional:  Alert and oriented, No acute distress.  In wheelchair, moving hands but not lower extremities.  Accompanied by daughter.  Extremely pleasant. HEENT: [place] AT, moist mucus membranes.  Trachea midline, no masses. Cardiovascular: No clubbing, cyanosis, or edema. Respiratory: Normal respiratory effort, no increased work of breathing.. Skin: No rashes, bruises or suspicious lesions. Neurologic: Grossly intact, no focal deficits, moving all 4 extremities. Psychiatric: Normal mood and affect.    Assessment & Plan:    1. 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  She is most interested in suprapubic tube.  We discussed the benefits of this including decreased risk of urethral erosion and possibly decreased risk of infection although both types of bladder drainage ultimately resulted in chronic bacterial colonization.  We discussed the risk of blood in the urine, infection, damage surrounding structures.  I will strongly recommend placing this tube under image guidance in interventional radiology.  The tube will then be upsized to 16 [place] and ultimately a normal 16 [place] Foley catheter which can be changed monthly at home.  We also discussed the advantages as she continues to regain function and mobility, we can attempt clamping trials of the 2, check residuals and see if she continues to require continuous bladder drainage.   Both she and her daughter are agreeable this plan.  We will arrange for IR SP tube placement.   Hollice Espy, MD  [facility] [address], [place], [place] [address] [phone]

## 2020-03-22 ENCOUNTER — Encounter: Payer: Self-pay | Admitting: Urology

## 2020-03-22 ENCOUNTER — Other Ambulatory Visit: Payer: Self-pay

## 2020-03-22 ENCOUNTER — Ambulatory Visit: Payer: Medicare PPO | Admitting: Urology

## 2020-03-22 VITALS — BP 106/66 | HR 85 | Ht 63.0 in | Wt 180.0 lb

## 2020-03-22 DIAGNOSIS — N319 Neuromuscular dysfunction of bladder, unspecified: Secondary | ICD-10-CM

## 2020-03-22 NOTE — Patient Instructions (Signed)
Suprapubic Catheter Home Guide A suprapubic catheter is a flexible tube that is used to drain urine from the bladder into a collection bag outside the body. The catheter is inserted into the bladder through a small opening in the lower abdomen, above the pubic bone (suprapubic area) and a few inches below your belly button (navel). A tiny balloon filled with germ-free (sterile) water helps to keep the catheter in place. The collection bag must be emptied at least once a day and cleaned at least every other day. The collection bag can be put beside your bed at night and attached to your leg during the day. You may have a large collection bag to use at night and a smaller one to use during the day. Your suprapubic catheter may need to be changed every 4-6 weeks, or as often as recommended by your health care provider. Healing of the tract where the catheter is placed can take 6 weeks to 6 months. During that time, your health care provider may change your catheter. Once the tract is well healed, you or a caregiver will change your suprapubic catheter at home. What are the risks? This catheter is safe to use. However, problems can occur, including:  Blocked urine flow. This can occur if the catheter stops working, or if you have a blood clot in your bladder or in the catheter.  Irritation of the skin around the catheter.  Infection. This can happen if bacteria gets into your bladder. Supplies needed:  Two pairs of sterile gloves.  Paper towels.  Catheter.  Two syringes.  Sterile water.  Sterile cleaning solution.  Lubricant.  Collection bags. How to change the catheter  1. Drink plenty of fluids during the hours before you change the catheter. 2. Wash your hands with soap and water. If soap and water are not available, use hand sanitizer. 3. Draw up sterile water into a syringe to have ready to fill the new catheter balloon. The amount will depend on the size of the balloon. 4. Have  all of your supplies ready and close to you on a paper towel. 5. Lie on your back, sitting slightly upright so that you can see the catheter and opening. 6. Put on sterile gloves. 7. Clean the skin around the catheter opening using the sterile cleaning solution. 8. Remove the water from the balloon in the catheter using a syringe. 9. Slowly remove the catheter. If the catheter seems stuck, or if you have difficulty removing it: ? Do not pull on it. ? Call your health care provider right away. 10. Place the old catheter on a paper towel to discard later. 11. Take off the used gloves, and put on a new pair. 12. Put lubricant on the end of the new catheter that will go into your bladder. 13. Clean the skin around the catheter opening using the sterile cleaning solution. 14. Gently slide the catheter through the opening in your abdomen and into the tract that leads to your bladder. 15. Wait for some urine to start flowing through the catheter. 16. When urine starts to flow through the catheter, attach the collection bag to the end of the catheter. Make sure the connection is tight. 17. Use a syringe to fill the catheter balloon with sterile water. Fill to the amount directed by your health care provider. 18. Remove the gloves and wash your hands with soap and water. How to care for the skin around the catheter Follow your health care provider's instructions on  caring for your skin.  Use a clean washcloth and soapy water to clean the skin around your catheter every day. Pat the area dry with a clean paper towel.  Do not pull on the catheter.  Do not use ointment or lotion on this area, unless told by your health care provider.  Check the skin around the catheter every day for signs of infection. Check for: ? Redness, swelling, or pain. ? Fluid or blood. ? Warmth. ? Pus or a bad smell. How to empty and clean the collection bag Empty the large collection bag every 8 hours. Empty the small  collection bag when it is about ? full. Clean the collection bag every 2-3 days, or as often as told by your health care provider. To do this: 1. Wash your hands with soap and water. If soap and water are not available, use hand sanitizer. 2. Disconnect the bag from the catheter and immediately attach a new bag to the catheter. 3. Hold the used bag over the toilet or another container. 4. Turn the valve (spigot) at the bottom of the bag to empty the urine. Empty the used bag completely. ? Do not touch the opening of the spigot. ? Do not let the opening touch the toilet or container. 5. Close the spigot tightly when the bag is empty. 6. Clean the used bag in one of the following methods: ? According to the manufacturer's instructions. ? As told by your health care provider. 7. Let the bag dry completely. Put it in a clean plastic bag before storing it. General tips   Always wash your hands before and after caring for your catheter and collection bag. Use a mild, fragrance-free soap. If soap and water are not available, use hand sanitizer.  Clean the outside of the catheter with soap and water as often as told by your health care provider.  Always make sure there are no twists or kinks in the catheter tube.  Always make sure there are no leaks in the catheter or collection bag.  Always wear the leg bag below your knee.  Make sure the overnight drainage bag is always lower than the level of your bladder, but do not let it touch the floor. Before you go to sleep, hang the bag inside a wastebasket that is covered by a clean plastic bag.  Drink enough fluid to keep your urine pale yellow.  Do not take baths, swim, or use a hot tub until your health care provider approves. Ask your health care provider if you may take showers. Contact a heath care provider if:  You leak urine.  You have redness, swelling, or pain around your catheter.  You have fluid or blood coming from your catheter  opening.  Your catheter opening feels warm to the touch.  You have pus or a bad smell coming from your catheter opening.  You have a fever or chills.  Your urine flow slows down.  Your urine becomes cloudy or smelly. Get help right away if:  Your catheter comes out.  You have: ? Nausea. ? Back pain. ? Difficulty changing your catheter. ? Blood in your urine. ? No urine flow for 1 hour. Summary  A suprapubic catheter is a flexible tube that is used to drain urine from the bladder into a collection bag outside the body.  Your suprapubic catheter may need to be changed every 4-6 weeks, or as recommended by your health care provider.  Follow instructions on how to  change the catheter and how to empty and clean the collection bag.  Always wash your hands before and after caring for your catheter and collection bag. Drink enough fluid to keep your urine pale yellow.  Get help right away if you have difficulty changing your catheter or if there is blood in your urine. This information is not intended to replace advice given to you by your health care provider. Make sure you discuss any questions you have with your health care provider. Document Revised: 07/21/2018 Document Reviewed: 05/04/2018 Elsevier Patient Education  [place].

## 2020-03-25 ENCOUNTER — Other Ambulatory Visit: Payer: Self-pay | Admitting: Radiology

## 2020-03-25 DIAGNOSIS — N319 Neuromuscular dysfunction of bladder, unspecified: Secondary | ICD-10-CM

## 2020-03-26 ENCOUNTER — Other Ambulatory Visit: Payer: Self-pay | Admitting: Radiology

## 2020-03-26 NOTE — Progress Notes (Signed)
Patient on schedule for SPT placement 03/28/2020. Spoke with Kim/daughter on phone with pre procedure instructions given for mother. Made aware to be here @ 0930, NPO after Mn prior to procedure as well as driver for discharge post procedure/recovery. Stated understanding.

## 2020-03-27 ENCOUNTER — Other Ambulatory Visit: Payer: Self-pay | Admitting: Radiology

## 2020-03-28 ENCOUNTER — Other Ambulatory Visit: Payer: Self-pay

## 2020-03-28 ENCOUNTER — Ambulatory Visit
Admission: RE | Admit: 2020-03-28 | Discharge: 2020-03-28 | Disposition: A | Discharge status: Home / Self Care | Payer: Medicare PPO | Source: Ambulatory Visit | Attending: Urology | Admitting: Urology

## 2020-03-28 ENCOUNTER — Encounter: Payer: Self-pay | Admitting: Urology

## 2020-03-28 DIAGNOSIS — Z79899 Other long term (current) drug therapy: Secondary | ICD-10-CM | POA: Diagnosis not present

## 2020-03-28 DIAGNOSIS — N319 Neuromuscular dysfunction of bladder, unspecified: Secondary | ICD-10-CM | POA: Diagnosis present

## 2020-03-28 LAB — BASIC METABOLIC PANEL
Anion gap: 10 (ref 5–15)
BUN: 15 mg/dL (ref 8–23)
CO2: 31 mmol/L (ref 22–32)
Calcium: 9.4 mg/dL (ref 8.9–10.3)
Chloride: 99 mmol/L (ref 98–111)
Creatinine, Ser: 0.8 mg/dL (ref 0.44–1.00)
GFR, Estimated: >60 mL/min (ref >60)
Glucose, Bld: 109 mg/dL — ABNORMAL HIGH (ref 70–99)
Potassium: 4 mmol/L (ref 3.5–5.1)
Sodium: 140 mmol/L (ref 135–145)

## 2020-03-28 LAB — CBC WITH DIFFERENTIAL/PLATELET
Abs Immature Granulocytes: 0.01 10*3/uL (ref 0.00–0.07)
Basophils Absolute: 0 10*3/uL (ref 0.0–0.1)
Basophils Relative: 0 %
Eosinophils Absolute: 0.1 10*3/uL (ref 0.0–0.5)
Eosinophils Relative: 2 %
HCT: 44.1 % (ref 36.0–46.0)
Hemoglobin: 13.9 g/dL (ref 12.0–15.0)
Immature Granulocytes: 0 %
Lymphocytes Relative: 26 %
Lymphs Abs: 1.5 10*3/uL (ref 0.7–4.0)
MCH: 26.6 pg (ref 26.0–34.0)
MCHC: 31.5 g/dL (ref 30.0–36.0)
MCV: 84.5 fL (ref 80.0–100.0)
Monocytes Absolute: 0.3 10*3/uL (ref 0.1–1.0)
Monocytes Relative: 5 %
Neutro Abs: 3.8 10*3/uL (ref 1.7–7.7)
Neutrophils Relative %: 67 %
Platelets: 241 10*3/uL (ref 150–400)
RBC: 5.22 MIL/uL — ABNORMAL HIGH (ref 3.87–5.11)
RDW: 14.6 % (ref 11.5–15.5)
WBC: 5.7 10*3/uL (ref 4.0–10.5)
nRBC: 0 % (ref 0.0–0.2)

## 2020-03-28 LAB — PROTIME-INR
INR: 0.9 (ref 0.8–1.2)
Prothrombin Time: 11.5 seconds (ref 11.4–15.2)

## 2020-03-28 MED ORDER — FENTANYL CITRATE (PF) 100 MCG/2ML IJ SOLN
INTRAMUSCULAR | Status: AC
  Filled 2020-03-28: qty 2

## 2020-03-28 MED ORDER — CEFAZOLIN SODIUM-DEXTROSE 1-4 GM/50ML-% IV SOLN
INTRAVENOUS | Status: AC
  Filled 2020-03-28: qty 100

## 2020-03-28 MED ORDER — MIDAZOLAM HCL 2 MG/2ML IJ SOLN
INTRAMUSCULAR | Status: DC | PRN
  Administered 2020-03-28: 1 mg via INTRAVENOUS

## 2020-03-28 MED ORDER — CEFAZOLIN SODIUM-DEXTROSE 2-4 GM/100ML-% IV SOLN
2.0000 g | INTRAVENOUS | Status: DC
  Filled 2020-03-28: qty 100

## 2020-03-28 MED ORDER — FENTANYL CITRATE (PF) 100 MCG/2ML IJ SOLN
INTRAMUSCULAR | Status: DC | PRN
  Administered 2020-03-28: 50 ug via INTRAVENOUS

## 2020-03-28 MED ORDER — CEFAZOLIN SODIUM-DEXTROSE 1-4 GM/50ML-% IV SOLN
INTRAVENOUS | Status: DC | PRN
  Administered 2020-03-28: 2 g via INTRAVENOUS

## 2020-03-28 MED ORDER — MIDAZOLAM HCL 2 MG/2ML IJ SOLN
INTRAMUSCULAR | Status: AC
  Filled 2020-03-28: qty 2

## 2020-03-28 MED ORDER — SODIUM CHLORIDE 0.9 % IV SOLN: INTRAVENOUS | Status: DC

## 2020-03-28 NOTE — Procedures (Signed)
Interventional Radiology Procedure Note  Procedure: CT guided cystostomy catheter placement  Findings: Please refer to procedural dictation for full description. 12 Fr pigtail cystostomy placed.  Complications: None immediate  Estimated Blood Loss: <5 mL  Recommendations: Keep to bag drainage. Follow up in 4-6 weeks for upsize to 16 Fr.   Ruthann Cancer, MD

## 2020-03-28 NOTE — H&P (Signed)
Chief Complaint: Patient was seen in consultation today for suprapubic catheter placement  Referring Physician(s): Brandon,Ashley  Patient Status: [facility] - Out-pt  History of Present Illness: Meredith Roach is a 79 y.o. 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  She presents today at the request of Dr. Erlene Quan for suprapubic catheter placement.  She feels well today and endorses continued strength and mobility strides in her left upper extremity.  No fevers, chills, shortness of breath, chest pain, abdominal pain.  Past Medical History:  Diagnosis Date  . Arthritis   . GERD (gastroesophageal reflux disease)   . Gout   . Neuromuscular disorder ([place])   . Pre-diabetes     Past Surgical History:  Procedure Laterality Date  . ANTERIOR CERVICAL DECOMPRESSION/DISCECTOMY FUSION 4 LEVELS N/A 08/28/2019   Procedure: ANTERIOR CERVICAL DECOMPRESSION/DISCECTOMY FUSION CERVICALTHREE-FOUR CERVICAL,FOUR-FIVE,CERVICAL FIVE-SIX,CERVICAL SIX-SEVEN.;  Surgeon: [name], MD;  Location: [place];  Service: Neurosurgery;  Laterality: N/A;  ANTERIOR  . BACK SURGERY    . CATARACT EXTRACTION W/PHACO Right 03/31/2018   Procedure: CATARACT EXTRACTION PHACO AND INTRAOCULAR LENS PLACEMENT ([place]);  Surgeon: [name], MD;  Location: [facility] ORS;  Service: Ophthalmology;  Laterality: Right;  [place] 00:39.7 CDE 4.35 Fluid Pack Lot # [id number]  . CHOLECYSTECTOMY  1995  . COLONOSCOPY WITH PROPOFOL N/A 06/07/2017   Procedure: COLONOSCOPY WITH PROPOFOL;  Surgeon: [name], MD;  Location: [facility] ENDOSCOPY;  Service: Endoscopy;  Laterality: N/A;  . ESOPHAGOGASTRODUODENOSCOPY (EGD) WITH PROPOFOL N/A 06/07/2017   Procedure: ESOPHAGOGASTRODUODENOSCOPY (EGD) WITH PROPOFOL;  Surgeon: [name], MD;  Location: [facility] ENDOSCOPY;  Service: Endoscopy;  Laterality: N/A;  . REDUCTION MAMMAPLASTY Bilateral 1994    Allergies: Ace  inhibitors  Medications: Prior to Admission medications   Medication Sig Start Date End Date Taking? Authorizing Provider  baclofen (LIORESAL) 10 MG tablet TAKE 1 TABLET BY MOUTH THREE TIMES DAILY 11/21/19  Yes Lovorn, Jinny Blossom, MD  DULoxetine (CYMBALTA) 60 MG capsule Take 1 capsule (60 mg total) by mouth at bedtime. 11/27/19  Yes Lovorn, Jinny Blossom, MD  gabapentin (NEURONTIN) 600 MG tablet Take 1 tablet (600 mg total) by mouth 3 (three) times daily. For nerve pain 01/15/20  Yes Lovorn, Jinny Blossom, MD  omeprazole (PRILOSEC) 40 MG capsule Take 40 mg by mouth at bedtime.    [provider]  senna (SENOKOT) 8.6 MG TABS tablet Take 2 tablets (17.2 mg total) by mouth daily. Patient not taking: Reported on 03/28/2020 09/29/19   Love, Ivan Anchors, PA-C     Family History  Problem Relation Age of Onset  . Breast cancer Maternal Aunt   . Breast cancer Paternal Aunt   . CAD Father   . Heart attack Father     Social History   Socioeconomic History  . Marital status: Married    Spouse name: [name]  . Number of children: 2  . Years of education: Not on file  . Highest education level: Not on file  Occupational History  . Not on file  Tobacco Use  . Smoking status: Never Smoker  . Smokeless tobacco: Never Used  Vaping Use  . Vaping Use: Never used  Substance and Sexual Activity  . Alcohol use: No  . Drug use: No  . Sexual activity: Not Currently  Other Topics Concern  . Not on file  Social History Narrative  . Not on file   Social Determinants of Health   Financial Resource Strain: Not on file  Food Insecurity: Not on file  Transportation Needs: Not on file  Physical Activity: Not on file  Stress: Not on file  Social Connections: Not on file    Review of Systems: A 12 point ROS discussed and pertinent positives are indicated in the HPI above.  All other systems are negative.  Vital Signs: BP (!) 123/57   Pulse (!) 50   Temp 97.8 F (36.6 C) (Oral)   Resp 17   Ht 5\' 3"  (1.6 m)    Wt 81.6 kg   SpO2 97%   BMI 31.89 kg/m   Physical Exam Constitutional:      General: She is not in acute distress. HENT:     Head: Normocephalic.     Mouth/Throat:     Mouth: Mucous membranes are moist.     Comments: MP2  Cardiovascular:     Rate and Rhythm: Normal rate and regular rhythm.     Heart sounds: Normal heart sounds.  Pulmonary:     Effort: Pulmonary effort is normal.     Breath sounds: Normal breath sounds.  Abdominal:     General: There is no distension.  Genitourinary:    Comments: Foley in place, clamped.  Neurological:     Mental Status: She is alert.     Imaging: No results found.  Labs:  CBC: Recent Labs    09/18/19 0654 09/22/19 0636 09/25/19 1206 03/28/20 0942  WBC 6.4 3.9* 6.3 5.7  HGB 12.6 11.8* 13.1 13.9  HCT 40.7 37.1 39.8 44.1  PLT 139* 139* 143* 241    COAGS: Recent Labs    03/28/20 0942  INR 0.9    BMP: Recent Labs    09/18/19 0654 09/19/19 0609 09/22/19 0636 09/26/19 0714 03/28/20 0942  NA 142 138 139 140 140  K 5.6* 4.9 4.3 4.1 4.0  CL 104 104 104 105 99  CO2 27 23 25 26 31   GLUCOSE 100* 104* 106* 99 109*  BUN 23 21 17 10 15   CALCIUM 8.7* 8.3* 8.4* 8.4* 9.4  CREATININE 0.85 0.65 0.72 0.60 0.80  GFRNONAA >60 >60 >60 >60 >60  GFRAA >60 >60 >60 >60  --     LIVER FUNCTION TESTS: Recent Labs    08/26/19 1651 09/01/19 0713  BILITOT 0.9 1.0  AST 30 25  ALT 15 29  ALKPHOS 62 57  PROT 6.3* 6.2*  ALBUMIN 3.6 3.4*    TUMOR MARKERS: No results for input(s): AFPTM, CEA, CA199, CHROMGRNA in the last 8760 hours.  Assessment and Plan:  79 year old female with history of neurogenic bladder presenting for suprapubic catheter placement.  Plan for CT guidance and moderate sedation.  Risks and benefits discussed with the patient including bleeding, infection, damage to adjacent structures, bowel perforation/fistula connection, and sepsis.  All of the patient's questions were answered, patient is agreeable to  proceed. Consent signed and in chart.     Thank you for this interesting consult.  I greatly enjoyed meeting Meredith Roach and look forward to participating in their care.  A copy of this report was sent to the requesting provider on this date.  Electronically Signed: Suzette Battiest, MD 03/28/2020, 11:42 AM   I spent a total of  15 Minutes  in face to face in clinical consultation, greater than 50% of which was counseling/coordinating care for suprapubic catheter placement.

## 2020-03-28 NOTE — Progress Notes (Signed)
Patient clinically stable post SPT placement 12FR per Dr Serafina Royals, tolerated well with pinkish urine draining.awake/alert and oriented post procedure. Report given to Oswaldo Done in specials post procedure/recovery. Denies complaints at this time. Received Versed 1mg  along with Fentanyl 50 mcg IV for procedure.

## 2020-03-28 NOTE — Discharge Instructions (Signed)
Moderate Conscious Sedation, Adult Sedation is the use of medicines to promote relaxation and relieve discomfort and anxiety. Moderate conscious sedation is a type of sedation. Under moderate conscious sedation, you are less alert than normal, but you are still able to respond to instructions, touch, or both. Moderate conscious sedation is used during short medical and dental procedures. It is milder than deep sedation, which is a type of sedation under which you cannot be easily woken up. It is also milder than general anesthesia, which is the use of medicines to make you unconscious. Moderate conscious sedation allows you to return to your regular activities sooner. Tell a health care provider about:  Any allergies you have.  All medicines you are taking, including vitamins, herbs, eye drops, creams, and over-the-counter medicines.  Use of steroids (by mouth or creams).  Any problems you or family members have had with sedatives and anesthetic medicines.  Any blood disorders you have.  Any surgeries you have had.  Any medical conditions you have, such as sleep apnea.  Whether you are pregnant or may be pregnant.  Any use of cigarettes, alcohol, marijuana, or street drugs. What are the risks? Generally, this is a safe procedure. However, problems may occur, including:  Getting too much medicine (oversedation).  Nausea.  Allergic reaction to medicines.  Trouble breathing. If this happens, a breathing tube may be used to help with breathing. It will be removed when you are awake and breathing on your own.  Heart trouble.  Lung trouble. What happens before the procedure? Staying hydrated Follow instructions from your health care provider about hydration, which may include:  Up to 2 hours before the procedure - you may continue to drink clear liquids, such as water, clear fruit juice, black coffee, and plain tea. Eating and drinking restrictions Follow instructions from your  health care provider about eating and drinking, which may include:  8 hours before the procedure - stop eating heavy meals or foods such as meat, fried foods, or fatty foods.  6 hours before the procedure - stop eating light meals or foods, such as toast or cereal.  6 hours before the procedure - stop drinking milk or drinks that contain milk.  2 hours before the procedure - stop drinking clear liquids. Medicine Ask your health care provider about:  Changing or stopping your regular medicines. This is especially important if you are taking diabetes medicines or blood thinners.  Taking medicines such as aspirin and ibuprofen. These medicines can thin your blood. Do not take these medicines before your procedure if your health care provider instructs you not to.  Tests and exams  You will have a physical exam.  You may have blood tests done to show: ? How well your kidneys and liver are working. ? How well your blood can clot. General instructions  Plan to have someone take you home from the hospital or clinic.  If you will be going home right after the procedure, plan to have someone with you for 24 hours. What happens during the procedure?  An IV tube will be inserted into one of your veins.  Medicine to help you relax (sedative) will be given through the IV tube.  The medical or dental procedure will be performed. What happens after the procedure?  Your blood pressure, heart rate, breathing rate, and blood oxygen level will be monitored often until the medicines you were given have worn off.  Do not drive for 24 hours. This information is not   intended to replace advice given to you by your health care provider. Make sure you discuss any questions you have with your health care provider. Document Revised: 03/12/2017 Document Reviewed: 07/20/2015 Elsevier Patient Education  [place] A suprapubic catheter is a flexible tube that is  used to drain urine from the bladder into a collection bag outside the body. The catheter is inserted into the bladder through a small opening in the lower abdomen, above the pubic bone (suprapubic area) and a few inches below your belly button (navel). A tiny balloon filled with germ-free (sterile) water helps to keep the catheter in place. The collection bag must be emptied at least once a day and cleaned at least every other day. The collection bag can be put beside your bed at night and attached to your leg during the day. You may have a large collection bag to use at night and a smaller one to use during the day. Your suprapubic catheter may need to be changed every 4-6 weeks, or as often as recommended by your health care provider. Healing of the tract where the catheter is placed can take 6 weeks to 6 months. During that time, your health care provider may change your catheter. Once the tract is well healed, you or a caregiver will change your suprapubic catheter at home. What are the risks? This catheter is safe to use. However, problems can occur, including:  Blocked urine flow. This can occur if the catheter stops working, or if you have a blood clot in your bladder or in the catheter.  Irritation of the skin around the catheter.  Infection. This can happen if bacteria gets into your bladder. Supplies needed:  Two pairs of sterile gloves.  Paper towels.  Catheter.  Two syringes.  Sterile water.  Sterile cleaning solution.  Lubricant.  Collection bags. How to change the catheter  1. Drink plenty of fluids during the hours before you change the catheter. 2. Wash your hands with soap and water. If soap and water are not available, use hand sanitizer. 3. Draw up sterile water into a syringe to have ready to fill the new catheter balloon. The amount will depend on the size of the balloon. 4. Have all of your supplies ready and close to you on a paper towel. 5. Lie on your back,  sitting slightly upright so that you can see the catheter and opening. 6. Put on sterile gloves. 7. Clean the skin around the catheter opening using the sterile cleaning solution. 8. Remove the water from the balloon in the catheter using a syringe. 9. Slowly remove the catheter. If the catheter seems stuck, or if you have difficulty removing it: ? Do not pull on it. ? Call your health care provider right away. 10. Place the old catheter on a paper towel to discard later. 11. Take off the used gloves, and put on a new pair. 12. Put lubricant on the end of the new catheter that will go into your bladder. 13. Clean the skin around the catheter opening using the sterile cleaning solution. 14. Gently slide the catheter through the opening in your abdomen and into the tract that leads to your bladder. 15. Wait for some urine to start flowing through the catheter. 16. When urine starts to flow through the catheter, attach the collection bag to the end of the catheter. Make sure the connection is tight. 17. Use a syringe to fill the catheter balloon with sterile  water. Fill to the amount directed by your health care provider. 18. Remove the gloves and wash your hands with soap and water. How to care for the skin around the catheter Follow your health care provider's instructions on caring for your skin.  Use a clean washcloth and soapy water to clean the skin around your catheter every day. Pat the area dry with a clean paper towel.  Do not pull on the catheter.  Do not use ointment or lotion on this area, unless told by your health care provider.  Check the skin around the catheter every day for signs of infection. Check for: ? Redness, swelling, or pain. ? Fluid or blood. ? Warmth. ? Pus or a bad smell. How to empty and clean the collection bag Empty the large collection bag every 8 hours. Empty the small collection bag when it is about ? full. Clean the collection bag every 2-3 days, or as  often as told by your health care provider. To do this: 1. Wash your hands with soap and water. If soap and water are not available, use hand sanitizer. 2. Disconnect the bag from the catheter and immediately attach a new bag to the catheter. 3. Hold the used bag over the toilet or another container. 4. Turn the valve (spigot) at the bottom of the bag to empty the urine. Empty the used bag completely. ? Do not touch the opening of the spigot. ? Do not let the opening touch the toilet or container. 5. Close the spigot tightly when the bag is empty. 6. Clean the used bag in one of the following methods: ? According to the manufacturer's instructions. ? As told by your health care provider. 7. Let the bag dry completely. Put it in a clean plastic bag before storing it. General tips   Always wash your hands before and after caring for your catheter and collection bag. Use a mild, fragrance-free soap. If soap and water are not available, use hand sanitizer.  Clean the outside of the catheter with soap and water as often as told by your health care provider.  Always make sure there are no twists or kinks in the catheter tube.  Always make sure there are no leaks in the catheter or collection bag.  Always wear the leg bag below your knee.  Make sure the overnight drainage bag is always lower than the level of your bladder, but do not let it touch the floor. Before you go to sleep, hang the bag inside a wastebasket that is covered by a clean plastic bag.  Drink enough fluid to keep your urine pale yellow.  Do not take baths, swim, or use a hot tub until your health care provider approves. Ask your health care provider if you may take showers. Contact a heath care provider if:  You leak urine.  You have redness, swelling, or pain around your catheter.  You have fluid or blood coming from your catheter opening.  Your catheter opening feels warm to the touch.  You have pus or a bad smell  coming from your catheter opening.  You have a fever or chills.  Your urine flow slows down.  Your urine becomes cloudy or smelly. Get help right away if:  Your catheter comes out.  You have: ? Nausea. ? Back pain. ? Difficulty changing your catheter. ? Blood in your urine. ? No urine flow for 1 hour. Summary  A suprapubic catheter is a flexible tube that is used  to drain urine from the bladder into a collection bag outside the body.  Your suprapubic catheter may need to be changed every 4-6 weeks, or as recommended by your health care provider.  Follow instructions on how to change the catheter and how to empty and clean the collection bag.  Always wash your hands before and after caring for your catheter and collection bag. Drink enough fluid to keep your urine pale yellow.  Get help right away if you have difficulty changing your catheter or if there is blood in your urine. This information is not intended to replace advice given to you by your health care provider. Make sure you discuss any questions you have with your health care provider. Document Revised: 07/21/2018 Document Reviewed: 05/04/2018 Elsevier Patient Education  [place].

## 2020-03-29 NOTE — Telephone Encounter (Signed)
Spoke with patient and they got everything handled.

## 2020-04-02 ENCOUNTER — Telehealth: Payer: Self-pay | Admitting: *Deleted

## 2020-04-02 NOTE — Telephone Encounter (Signed)
Yes, please- I appreciate it. ML

## 2020-04-02 NOTE — Telephone Encounter (Signed)
Bill PT from [facility] called to request to recert Meredith Roach next week for 2wk9.  They actually have her walking some now.  Please advise if this ok.

## 2020-04-02 NOTE — Telephone Encounter (Signed)
Notified of approval

## 2020-04-17 ENCOUNTER — Encounter: Payer: Self-pay | Admitting: Urology

## 2020-04-19 ENCOUNTER — Other Ambulatory Visit: Payer: Self-pay | Admitting: Radiology

## 2020-04-19 ENCOUNTER — Encounter: Payer: Medicare PPO | Admitting: Physical Medicine and Rehabilitation

## 2020-04-19 DIAGNOSIS — N319 Neuromuscular dysfunction of bladder, unspecified: Secondary | ICD-10-CM

## 2020-05-03 ENCOUNTER — Ambulatory Visit: Admission: RE | Admit: 2020-05-03 | Payer: Medicare PPO | Source: Ambulatory Visit

## 2020-05-09 ENCOUNTER — Other Ambulatory Visit: Payer: Self-pay | Admitting: Radiology

## 2020-05-10 ENCOUNTER — Ambulatory Visit
Admission: RE | Admit: 2020-05-10 | Discharge: 2020-05-10 | Disposition: A | Discharge status: Home / Self Care | Payer: Medicare PPO | Source: Ambulatory Visit | Attending: Urology | Admitting: Urology

## 2020-05-10 ENCOUNTER — Other Ambulatory Visit: Payer: Self-pay

## 2020-05-10 DIAGNOSIS — N319 Neuromuscular dysfunction of bladder, unspecified: Secondary | ICD-10-CM | POA: Diagnosis not present

## 2020-05-10 DIAGNOSIS — Z435 Encounter for attention to cystostomy: Secondary | ICD-10-CM | POA: Diagnosis present

## 2020-05-10 HISTORY — PX: IR CATHETER TUBE CHANGE: IMG717

## 2020-05-10 MED ORDER — SODIUM CHLORIDE 0.9 % IV SOLN: INTRAVENOUS | Status: DC

## 2020-05-10 MED ORDER — FENTANYL CITRATE (PF) 100 MCG/2ML IJ SOLN
INTRAMUSCULAR | Status: DC | PRN
  Administered 2020-05-10: 50 ug via INTRAVENOUS

## 2020-05-10 MED ORDER — LIDOCAINE VISCOUS HCL 2 % MT SOLN
OROMUCOSAL | Status: AC
  Filled 2020-05-10: qty 15

## 2020-05-10 MED ORDER — MIDAZOLAM HCL 2 MG/2ML IJ SOLN
INTRAMUSCULAR | Status: DC | PRN
  Administered 2020-05-10: 1 mg via INTRAVENOUS

## 2020-05-10 MED ORDER — MIDAZOLAM HCL 5 MG/5ML IJ SOLN
INTRAMUSCULAR | Status: AC
  Filled 2020-05-10: qty 5

## 2020-05-10 MED ORDER — SODIUM CHLORIDE 0.9% FLUSH: 5.0000 mL | Freq: Three times a day (TID) | INTRAVENOUS | Status: DC

## 2020-05-10 MED ORDER — FENTANYL CITRATE (PF) 100 MCG/2ML IJ SOLN
INTRAMUSCULAR | Status: AC
  Filled 2020-05-10: qty 2

## 2020-05-10 NOTE — Procedures (Signed)
Interventional Radiology Procedure Note  Procedure: Suprapubic tube upsize   Indication: Neurogenic bladde3r  Findings: Please refer to procedural dictation for full description.  Complications: None  EBL: < 10 mL  Miachel Roux, MD [phone]

## 2020-05-10 NOTE — H&P (Signed)
Chief Complaint: Patient was seen in consultation today for suprapubic catheter placement  Referring Physician(s): Brandon,Ashley  Patient Status: [facility] - Out-pt  History of Present Illness: Meredith Roach is a 80 y.o. 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 She underwent suprapubic catheter placement with Dr. Elby ShowersSuttle 03/28/20.  [facility] today for routine exchange and upsize of her SPT.    Meredith Roach is assessed at bedside alongside her daughter. She feels well today. Denies concerns related to her SPT tube.  Reports it is working well without issue.  She does state that the larger bags she has at home for overnight use do not have a lure lock and asks if there is an adapter. Otherwise, she is in her usual state of health with no fevers, chills, shortness of breath, chest pain, abdominal pain.  Past Medical History:  Diagnosis Date  . Arthritis   . GERD (gastroesophageal reflux disease)   . Gout   . Neuromuscular disorder (HCC)   . Pre-diabetes     Past Surgical History:  Procedure Laterality Date  . ANTERIOR CERVICAL DECOMPRESSION/DISCECTOMY FUSION 4 LEVELS N/A 08/28/2019   Procedure: ANTERIOR CERVICAL DECOMPRESSION/DISCECTOMY FUSION CERVICALTHREE-FOUR CERVICAL,FOUR-FIVE,CERVICAL FIVE-SIX,CERVICAL SIX-SEVEN.;  Surgeon: Tia AlertJones, David S, MD;  [facility];  Service: Neurosurgery;  Laterality: N/A;  ANTERIOR  . BACK SURGERY    . CATARACT EXTRACTION W/PHACO Right 03/31/2018   Procedure: CATARACT EXTRACTION PHACO AND INTRAOCULAR LENS PLACEMENT (IOC);  Surgeon: Elliot CousinHarrow, Brian, MD;  Location: [facility] ORS;  Service: Ophthalmology;  Laterality: Right;  US 00:39.7 CDE 4.35 Fluid Pack Lot # [id number]  . CHOLECYSTECTOMY  1995  . COLONOSCOPY WITH PROPOFOL N/A 06/07/2017   Procedure: COLONOSCOPY WITH PROPOFOL;  Surgeon: Scot JunElliott, Robert T, MD;  [facility];  Service: Endoscopy;   Laterality: N/A;  . ESOPHAGOGASTRODUODENOSCOPY (EGD) WITH PROPOFOL N/A 06/07/2017   Procedure: ESOPHAGOGASTRODUODENOSCOPY (EGD) WITH PROPOFOL;  Surgeon: Scot JunElliott, Robert T, MD;  [facility];  Service: Endoscopy;  Laterality: N/A;  . REDUCTION MAMMAPLASTY Bilateral 1994    Allergies: Ace inhibitors  Medications: Prior to Admission medications   Medication Sig Start Date End Date Taking? Authorizing Provider  baclofen (LIORESAL) 10 MG tablet TAKE 1 TABLET BY MOUTH THREE TIMES DAILY 11/21/19  Yes Lovorn, Aundra MilletMegan, MD  DULoxetine (CYMBALTA) 60 MG capsule Take 1 capsule (60 mg total) by mouth at bedtime. 11/27/19  Yes Lovorn, Aundra MilletMegan, MD  gabapentin (NEURONTIN) 600 MG tablet Take 1 tablet (600 mg total) by mouth 3 (three) times daily. For nerve pain 01/15/20  Yes Lovorn, Aundra MilletMegan, MD  omeprazole (PRILOSEC) 40 MG capsule Take 40 mg by mouth at bedtime.    [provider]  senna (SENOKOT) 8.6 MG TABS tablet Take 2 tablets (17.2 mg total) by mouth daily. Patient not taking: Reported on 03/28/2020 09/29/19   Love, Evlyn KannerPamela S, PA-C     Family History  Problem Relation Age of Onset  . Breast cancer Maternal Aunt   . Breast cancer Paternal Aunt   . CAD Father   . Heart attack Father     Social History   Socioeconomic History  . Marital status: Married    Spouse name: Meredith AlbertFred  . Number of children: 2  . Years of education: Not on file  . Highest education level: Not on file  Occupational History  . Not on file  Tobacco Use  . Smoking status: Never Smoker  . Smokeless tobacco: Never Used  Vaping  Use  . Vaping Use: Never used  Substance and Sexual Activity  . Alcohol use: No  . Drug use: No  . Sexual activity: Not Currently  Other Topics Concern  . Not on file  Social History Narrative   Lives with daughter   Social Determinants of Health   Financial Resource Strain: Not on file  Food Insecurity: Not on file  Transportation Needs: Not on file  Physical Activity: Not on  file  Stress: Not on file  Social Connections: Not on file    Review of Systems: A 12 point ROS discussed and pertinent positives are indicated in the HPI above.  All other systems are negative.  Vital Signs: BP 122/79   Pulse 93   Temp 98 F (36.7 C) (Oral)   Resp 20   Ht 5\' 2"  (1.575 m)   Wt 190 lb (86.2 kg)   SpO2 97%   BMI 34.75 kg/m   Physical Exam Constitutional:      General: She is not in acute distress. HENT:     Head: Normocephalic.     Mouth/Throat:     Mouth: Mucous membranes are moist.     Comments: MP2  Cardiovascular:     Rate and Rhythm: Normal rate and regular rhythm.     Heart sounds: Normal heart sounds.  Pulmonary:     Effort: Pulmonary effort is normal. No respiratory distress.     Breath sounds: Normal breath sounds.  Genitourinary:    Comments: 12Fr SPT in place. Clear, yellow urine in bag.  Skin:    General: Skin is warm and dry.  Neurological:     General: No focal deficit present.     Mental Status: She is alert and oriented to person, place, and time. Mental status is at baseline.  Psychiatric:        Mood and Affect: Mood normal.        Behavior: Behavior normal.        Thought Content: Thought content normal.        Judgment: Judgment normal.     Imaging: No results found.  Labs:  CBC: Recent Labs    09/18/19 0654 09/22/19 0636 09/25/19 1206 03/28/20 0942  WBC 6.4 3.9* 6.3 5.7  HGB 12.6 11.8* 13.1 13.9  HCT 40.7 37.1 39.8 44.1  PLT 139* 139* 143* 241    COAGS: Recent Labs    03/28/20 0942  INR 0.9    BMP: Recent Labs    09/18/19 0654 09/19/19 0609 09/22/19 0636 09/26/19 0714 03/28/20 0942  NA 142 138 139 140 140  K 5.6* 4.9 4.3 4.1 4.0  CL 104 104 104 105 99  CO2 27 23 25 26 31   GLUCOSE 100* 104* 106* 99 109*  BUN 23 21 17 10 15   CALCIUM 8.7* 8.3* 8.4* 8.4* 9.4  CREATININE 0.85 0.65 0.72 0.60 0.80  GFRNONAA >60 >60 >60 >60 >60  GFRAA >60 >60 >60 >60  --     LIVER FUNCTION TESTS: Recent Labs     08/26/19 1651 09/01/19 0713  BILITOT 0.9 1.0  AST 30 25  ALT 15 29  ALKPHOS 62 57  PROT 6.3* 6.2*  ALBUMIN 3.6 3.4*    TUMOR MARKERS: No results for input(s): AFPTM, CEA, CA199, CHROMGRNA in the last 8760 hours.  Assessment and Plan:  80 year old female with history of neurogenic bladder presenting for suprapubic catheter exchange and upsize.   Current SPT in place without issue.  She is agreeable to  proceed.  RN to investigate adapter.   Risks and benefits discussed with the patient including bleeding, infection, damage to adjacent structures, bowel perforation/fistula connection, and sepsis.  All of the patient's questions were answered, patient is agreeable to proceed. Consent signed and in chart.     Thank you for this interesting consult.  I greatly enjoyed meeting CATHERN TAHIR and look forward to participating in their care.  A copy of this report was sent to the requesting provider on this date.  Electronically Signed: Docia Barrier, PA 05/10/2020, 8:49 AM   I spent a total of  15 Minutes  in face to face in clinical consultation, greater than 50% of which was counseling/coordinating care for suprapubic catheter placement.

## 2020-05-24 ENCOUNTER — Encounter: Payer: Self-pay | Admitting: Physical Medicine and Rehabilitation

## 2020-05-24 ENCOUNTER — Other Ambulatory Visit: Payer: Self-pay

## 2020-05-24 ENCOUNTER — Encounter: Payer: Medicare PPO | Attending: Physical Medicine and Rehabilitation | Admitting: Physical Medicine and Rehabilitation

## 2020-05-24 VITALS — BP 123/72 | HR 65 | Temp 98.3°F | Ht 62.0 in | Wt 190.0 lb

## 2020-05-24 DIAGNOSIS — N319 Neuromuscular dysfunction of bladder, unspecified: Secondary | ICD-10-CM | POA: Diagnosis present

## 2020-05-24 DIAGNOSIS — Z993 Dependence on wheelchair: Secondary | ICD-10-CM | POA: Diagnosis present

## 2020-05-24 DIAGNOSIS — G8250 Quadriplegia, unspecified: Secondary | ICD-10-CM | POA: Insufficient documentation

## 2020-05-24 DIAGNOSIS — R252 Cramp and spasm: Secondary | ICD-10-CM | POA: Diagnosis present

## 2020-05-24 DIAGNOSIS — M792 Neuralgia and neuritis, unspecified: Secondary | ICD-10-CM | POA: Insufficient documentation

## 2020-05-24 MED ORDER — BACLOFEN 10 MG PO TABS: 10.0000 mg | ORAL_TABLET | Freq: Three times a day (TID) | ORAL | 5 refills | Status: DC

## 2020-05-24 MED ORDER — GABAPENTIN 600 MG PO TABS: 600.0000 mg | ORAL_TABLET | Freq: Three times a day (TID) | ORAL | 5 refills | Status: DC

## 2020-05-24 MED ORDER — DULOXETINE HCL 60 MG PO CPEP: 60.0000 mg | ORAL_CAPSULE | Freq: Every day | ORAL | 5 refills | Status: DC

## 2020-05-24 MED ORDER — TIZANIDINE HCL 4 MG PO TABS: 4.0000 mg | ORAL_TABLET | Freq: Every day | ORAL | 5 refills | Status: DC

## 2020-05-24 NOTE — Patient Instructions (Signed)
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   1. Can try getting an Estim unit for home if she wants- will tell her what muscles to target.    2. Nerve pain controlled with gabapentin and Duloxetine- con't regimen  3. Honestly, I think the BEST treatment for your spasticity- is estim for hands and fingers- would focus on finger flexion and finger EXTENSION even more - let's wait on 5th finger abduction for now.  You tube has great ideas for placement Make sure it's for Electrical stimulation- motor recovery.   4. Will wait on increasing Baclofen for now- will try Tizanidine- 2-4 mg nightly for muscle tightness- don't take during day unless REALLY tight, because could make you dizzy/lower BP slightly.   5. Does think needs refills- will send for duloxetine, gabapentin 600 mg TID and Baclofen 10 mg TID  6. Wait on botox for fingers/hands at this point.  7. F/U in 3  months

## 2020-05-24 NOTE — Progress Notes (Signed)
Subjective:    Patient ID: Meredith Roach, female    DOB: 1940-07-12, 80 y.o.   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   Hands are getting cold- esp late in evening.   Walking with RW and push w/c behind her.  2 weeks ago, walked without w/c follow-  Next week would try without w/c follow at ALL. Legs didn't give way- walked 120+ ft- AWESOME!   Not having as much nerve pain as was before.  Still taking Duloxetine and Gabapentin for nerve pain-   Hands still get tightness-  Is taking Baclofen 10 mg 3x/day- truly 3x/day.    Now has suprapubic catheter- done 1 month ago- just upsized 3 weeks ago.    Knows when has to go/have BM- needs to GO ASAP- to 5 minutes- usually night time- has it on schedule pretty much.   HH cannot do estim. Hasn't been able to do.  Hasn't been doing bowel program, basically since got home.   Stopped Senna 1 month ago- hasn't needed it-   L>R legs swelling- doesn't use compression stockings- sometimes stings/burns- cna feel the stinging from swelling, mainly around the L kneecap. Not bad per family.   Has handicapped Lucianne Lei- just got in January 2022.    Pain Inventory Average Pain 2 Pain Right Now 2 My pain is intermittent, tingling and aching  LOCATION OF PAIN hand  BOWEL Number of stools per week: 6 Oral laxative use senekot Type of laxative fiber Enema or suppository use No  History of colostomy No  Incontinent No   BLADDER Suprapubic In and out cath, frequency  Able to self cath suprapubic Bladder incontinence No  Frequent urination No  Leakage with coughing No  Difficulty starting stream No  Incomplete bladder emptying No    Mobility use a wheelchair needs help with transfers  Function disabled: date  disabled . retired  Neuro/Psych weakness numbness tingling  Prior Studies Any changes since last visit?  no  Physicians involved in your care Any changes since last visit?  no   Family History  Problem Relation Age of Onset  . Breast cancer Maternal Aunt   . Breast cancer Paternal Aunt   . CAD Father   . Heart attack Father    Social History   Socioeconomic History  . Marital status: Married    Spouse name: [name]  . Number of children: 2  . Years of education: Not on file  . Highest education level: Not on file  Occupational History  . Not on file  Tobacco Use  . Smoking status: Never Smoker  . Smokeless tobacco: Never Used  Vaping Use  . Vaping Use: Never used  Substance and Sexual Activity  . Alcohol use: No  . Drug use: No  . Sexual activity: Not Currently  Other Topics Concern  . Not on file  Social History Narrative   Lives with daughter   Social Determinants of Health   Financial Resource Strain: Not on file  Food Insecurity: Not on file  Transportation Needs: Not on file  Physical Activity: Not on file  Stress: Not on file  Social Connections: Not on file   Past Surgical History:  Procedure Laterality Date  . ANTERIOR CERVICAL DECOMPRESSION/DISCECTOMY FUSION  4 LEVELS N/A 08/28/2019   Procedure: ANTERIOR CERVICAL DECOMPRESSION/DISCECTOMY FUSION CERVICALTHREE-FOUR CERVICAL,FOUR-FIVE,CERVICAL FIVE-SIX,CERVICAL SIX-SEVEN.;  Surgeon: [name], MD;  Location: [place];  Service: Neurosurgery;  Laterality: N/A;  ANTERIOR  . BACK SURGERY    . CATARACT EXTRACTION W/PHACO Right 03/31/2018   Procedure: CATARACT EXTRACTION PHACO AND INTRAOCULAR LENS PLACEMENT ([place]);  Surgeon: [name], MD;  Location: [facility];  Service: Ophthalmology;  Laterality: Right;  [place] 00:39.7 CDE 4.35 Fluid Pack Lot # [id number]  . CHOLECYSTECTOMY  1995  . COLONOSCOPY WITH PROPOFOL N/A 06/07/2017   Procedure: COLONOSCOPY WITH PROPOFOL;  Surgeon: [name], MD;  Location:  [facility] ENDOSCOPY;  Service: Endoscopy;  Laterality: N/A;  . ESOPHAGOGASTRODUODENOSCOPY (EGD) WITH PROPOFOL N/A 06/07/2017   Procedure: ESOPHAGOGASTRODUODENOSCOPY (EGD) WITH PROPOFOL;  Surgeon: [name], MD;  Location: [facility];  Service: Endoscopy;  Laterality: N/A;  . IR CATHETER TUBE CHANGE  05/10/2020  . REDUCTION MAMMAPLASTY Bilateral 1994   Past Medical History:  Diagnosis Date  . Arthritis   . GERD (gastroesophageal reflux disease)   . Gout   . Neuromuscular disorder ([place])   . Pre-diabetes    BP 123/72   Pulse 65   Temp 98.3 F (36.8 C)   Ht 5\' 2"  (1.575 m) Comment: last recorded  Wt 190 lb (86.2 kg) Comment: last recorded  BMI 34.75 kg/m   Opioid Risk Score:   Fall Risk Score:  `1  Depression screen PHQ 2/9  Depression screen [facility] 2/9 05/24/2020 01/15/2020 11/27/2019 10/11/2019  Decreased Interest 0 0 0 0  Down, Depressed, Hopeless 0 0 0 0  PHQ - 2 Score 0 0 0 0   Review of Systems  Constitutional: Negative.   HENT: Negative.   Eyes: Negative.   Respiratory: Negative.   Cardiovascular: Negative.   Gastrointestinal: Negative.   Endocrine: Negative.   Genitourinary: Negative.   Musculoskeletal: Negative.   Skin: Negative.   Allergic/Immunologic: Negative.   Neurological: Positive for weakness and numbness.       Tingling  Hematological: Negative.   Psychiatric/Behavioral: Negative.   All other systems reviewed and are negative.      Objective:   Physical Exam  Awake, alert, appropriate, in manual upright high backed w/c- accompanied by daughter, NAD L elbow -10-15 degrees lacking Elbow extension- lacking even with ROM R elbow lacking ~ 5 degrees of extension on R elbow MAS of 1+ to 2 in hands/fingers and R wrist;  MAS of 1 in R shoulder and elbow MAS of 1+ to 2 in L elbow- 1+ in L shoulder. 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   1. Can try getting an Estim unit for home if she wants- will tell her what muscles to target.    2. Nerve pain controlled with gabapentin and  Duloxetine- con't regimen  3. Honestly, I think the BEST treatment for your spasticity- is estim for hands and fingers- would focus on finger flexion and finger EXTENSION even more - let's wait on 5th finger abduction for now.  You tube has great ideas for placement Make sure it's for Electrical stimulation- motor recovery.   4. Will wait on increasing Baclofen for now- will try Tizanidine- 2-4 mg nightly for muscle tightness- don't take during day unless REALLY tight, because could make you dizzy/lower BP slightly.   5. Does think needs refills- will send for duloxetine, gabapentin 600 mg TID and Baclofen 10 mg TID  6. Wait on botox for fingers/hands at this point.  7. F/U in 3  Months  I spent a total of 30 minutes on visit- as detailed above.

## 2020-06-06 ENCOUNTER — Telehealth: Payer: Self-pay | Admitting: *Deleted

## 2020-06-06 NOTE — Telephone Encounter (Signed)
Bill PT with [facility] did a re eval and would like to recert for 2wk9.  She is not quite ready to go to outpt.  Please advise.

## 2020-06-06 NOTE — Telephone Encounter (Signed)
Sounds fine- I'm happy for her to do more with H/H therapies. - Thank you, ML

## 2020-06-07 NOTE — Telephone Encounter (Signed)
Notified of approval

## 2020-06-21 ENCOUNTER — Encounter: Payer: Self-pay | Admitting: Urology

## 2020-06-21 ENCOUNTER — Other Ambulatory Visit: Payer: Self-pay

## 2020-06-21 ENCOUNTER — Ambulatory Visit (INDEPENDENT_AMBULATORY_CARE_PROVIDER_SITE_OTHER): Payer: Medicare PPO | Admitting: Urology

## 2020-06-21 VITALS — BP 126/78 | HR 73 | Ht 62.0 in | Wt 190.0 lb

## 2020-06-21 DIAGNOSIS — N319 Neuromuscular dysfunction of bladder, unspecified: Secondary | ICD-10-CM

## 2020-06-21 DIAGNOSIS — Z9359 Other cystostomy status: Secondary | ICD-10-CM

## 2020-06-21 MED ORDER — CEFTRIAXONE SODIUM 1 G IJ SOLR
1.0000 g | Freq: Once | INTRAMUSCULAR | Status: AC
  Administered 2020-06-21: 1 g via INTRAMUSCULAR

## 2020-06-21 NOTE — Progress Notes (Signed)
IM Injection  Patient is present today for an IM Injection  Drug: Rocephin Dose:1GM Location: Right thigh Lot: [id number] Exp:10/2020 Patient tolerated well, no complications were noted  Preformed by: Fonnie Jarvis, CMA

## 2020-06-23 NOTE — Progress Notes (Signed)
06/21/2020 2:57 PM   Meredith Roach 1940/04/29 [id number]  Referring provider: Rusty Aus, MD [facility] [place],  [place] [address]  Chief Complaint  Patient presents with  . Follow-up    SPT change    HPI: 80 year old female who presents today for her first SP tube exchange.  She has a percentage of quadriplegia and elected SP tube is primary management for her bladder given neurogenic bladder.  Been upsized to a 16 [place] pigtail and she is due for exchange today.  She said no issues from the SP tube.  She is accompanied today by her daughter.   PMH: Past Medical History:  Diagnosis Date  . Arthritis   . GERD (gastroesophageal reflux disease)   . Gout   . Neuromuscular disorder ([place])   . Pre-diabetes     Surgical History: Past Surgical History:  Procedure Laterality Date  . ANTERIOR CERVICAL DECOMPRESSION/DISCECTOMY FUSION 4 LEVELS N/A 08/28/2019   Procedure: ANTERIOR CERVICAL DECOMPRESSION/DISCECTOMY FUSION CERVICALTHREE-FOUR CERVICAL,FOUR-FIVE,CERVICAL FIVE-SIX,CERVICAL SIX-SEVEN.;  Surgeon: [name], MD;  Location: [place];  Service: Neurosurgery;  Laterality: N/A;  ANTERIOR  . BACK SURGERY    . CATARACT EXTRACTION W/PHACO Right 03/31/2018   Procedure: CATARACT EXTRACTION PHACO AND INTRAOCULAR LENS PLACEMENT ([place]);  Surgeon: [name], MD;  Location: [facility];  Service: Ophthalmology;  Laterality: Right;  [place] 00:39.7 CDE 4.35 Fluid Pack Lot # [id number]  . CHOLECYSTECTOMY  1995  . COLONOSCOPY WITH PROPOFOL N/A 06/07/2017   Procedure: COLONOSCOPY WITH PROPOFOL;  Surgeon: [name], MD;  Location: [facility] ENDOSCOPY;  Service: Endoscopy;  Laterality: N/A;  . ESOPHAGOGASTRODUODENOSCOPY (EGD) WITH PROPOFOL N/A 06/07/2017   Procedure: ESOPHAGOGASTRODUODENOSCOPY (EGD) WITH PROPOFOL;  Surgeon: [name], MD;  Location: [facility] ENDOSCOPY;  Service: Endoscopy;  Laterality: N/A;  . IR CATHETER TUBE CHANGE   05/10/2020  . REDUCTION MAMMAPLASTY Bilateral 1994    Home Medications:  Allergies as of 06/21/2020      Reactions   Ace Inhibitors Anaphylaxis      Medication List       Accurate as of June 21, 2020 11:59 PM. If you have any questions, ask your nurse or doctor.        baclofen 10 MG tablet Commonly known as: LIORESAL Take 1 tablet (10 mg total) by mouth 3 (three) times daily.   DULoxetine 60 MG capsule Commonly known as: Cymbalta Take 1 capsule (60 mg total) by mouth at bedtime.   gabapentin 600 MG tablet Commonly known as: Neurontin Take 1 tablet (600 mg total) by mouth 3 (three) times daily. For nerve pain   omeprazole 40 MG capsule Commonly known as: PRILOSEC Take 40 mg by mouth at bedtime.   senna 8.6 MG Tabs tablet Commonly known as: SENOKOT Take 2 tablets (17.2 mg total) by mouth daily.   tiZANidine 4 MG tablet Commonly known as: Zanaflex Take 1 tablet (4 mg total) by mouth at bedtime. For muscle tightness/spasms- only at night       Allergies:  Allergies  Allergen Reactions  . Ace Inhibitors Anaphylaxis    Family History: Family History  Problem Relation Age of Onset  . Breast cancer Maternal Aunt   . Breast cancer Paternal Aunt   . CAD Father   . Heart attack Father     Social History:  reports that she has never smoked. She has never used smokeless tobacco. She reports that she does not drink alcohol and does not use drugs.  Physical Exam: BP 126/78   Pulse 73   Ht '5\' 2"'  (1.575 m)   Wt 190 lb (86.2 kg)   BMI 34.75 kg/m   Constitutional:  Alert and oriented, No acute distress.  In wheelchair, able to position in a recumbent position.  Daughter present. HEENT: [place] AT, moist mucus membranes.  Trachea midline, no masses. Cardiovascular: No clubbing, cyanosis, or edema. Respiratory: Normal respiratory effort, no increased work of breathing. GI: Abdomen is soft, nontender, nondistended, no abdominal masses.  SP tube site clean dry and intact  without surrounding erythema. Skin: No rashes, bruises or suspicious lesions. Psychiatric: Normal mood and affect.  Procedure: Pigtail SP tube, 16 French suture was cut and freed.  String of the pigtail was loosened and the catheter was removed without difficulty.  SP tube site was prepped using Betadine solution.  I first attempted a normal nonlatex 24 French Foley catheter however met resistance in the balloon to inflate properly.  We tried recurrent position her to more recumbent position.  Then attempted a daily this was also unsuccessful.  Ultimately, I ended up bringing a flexible cystoscope and was able to scope the tract.  There is a small flap of tissue at the level of the fascia which I was able to navigate with the flexible cystoscope and introduced into the bladder.  The bladder was carefully inspected with some small amount of catheter cystitis in the posterior bladder wall, open bladder neck otherwise unremarkable.  A Super Stiff wire was then advanced through the cystoscope leaving the wire in place and removing the scope.  The site was then reprepped.  Additional sterile lubricant was applied and I was able to advance a 16 [place] council tip with some difficulty into the bladder.  Once in the bladder, drained light pink urine.  The wire was removed and the balloon was inflated with 12 cc.  The site was then dressed using a gauze.  Due to the amount of manipulation today, we did elect to give her 1 g of ceftriaxone IM to reduce infectious complications.   Assessment & Plan:    1. Neurogenic bladder Managed with SP tube - cefTRIAXone (ROCEPHIN) injection 1 g  2. Suprapubic catheter ([place]) Complicated suprapubic exchange today requiring cystoscopy and placement of a wire  We will have her return in 4 weeks for an exchange again in our office, over a wire as a precaution.  She is agreeable this plan.   Hollice Espy, MD  [facility] [address],  [place], [place] [address] [phone]

## 2020-07-19 NOTE — Progress Notes (Signed)
Error

## 2020-07-22 ENCOUNTER — Ambulatory Visit: Payer: Medicare PPO | Admitting: Urology

## 2020-07-22 ENCOUNTER — Encounter: Payer: Self-pay | Admitting: Urology

## 2020-07-22 ENCOUNTER — Other Ambulatory Visit: Payer: Self-pay

## 2020-07-22 VITALS — BP 120/77 | HR 80 | Ht 62.0 in | Wt 180.0 lb

## 2020-07-22 DIAGNOSIS — Z9359 Other cystostomy status: Secondary | ICD-10-CM | POA: Diagnosis not present

## 2020-07-22 MED ORDER — CIPROFLOXACIN HCL 500 MG PO TABS: 500.0000 mg | ORAL_TABLET | Freq: Once | ORAL | Status: DC

## 2020-07-22 MED ORDER — CIPROFLOXACIN HCL 500 MG PO TABS
500.0000 mg | ORAL_TABLET | Freq: Once | ORAL | Status: AC
Start: 2020-07-22 — End: 2020-07-22
  Administered 2020-07-22: 500 mg via ORAL

## 2020-07-22 NOTE — Progress Notes (Signed)
Suprapubic Cath Change Patient is present today for a suprapubic catheter change due to urinary retention. A stiff wire was advanced through the current indwelling 16 FR councill tip catheter. 12 ml of water was drained from the balloon, a 16 FR foley cath was removed from the tract with out difficulty.  Site was cleaned and prepped in a sterile fashion with betadine.  A 16 FR councill tip cath was advanced over the stiff wire into the bladder  no complications were noted. Urine return was noted, 10 ml of sterile water was inflated into the balloon.  I irrigated the SPT to confirm placement and an overnight bag was attached for drainage.  Patient tolerated well.  A 500 mg cipro tablet was given today due to the manupilation.  Preformed by: Salena Saner. Smith CMA, and [employer].  Follow up: 30 days for another suprapubic catheter change over wire.   I, Ardyth Gal, am acting as a Education administrator for [employer].  I have reviewed the above documentation for accuracy and completeness, and I agree with the above.    Zara Council, PA-C

## 2020-08-15 ENCOUNTER — Telehealth: Payer: Self-pay

## 2020-08-15 NOTE — Telephone Encounter (Signed)
Sounds great- thanks- ML

## 2020-08-15 NOTE — Telephone Encounter (Signed)
Bill notified.

## 2020-08-15 NOTE — Telephone Encounter (Signed)
Rush Landmark, in-home Physical Therapist with [facility] called:    Seeking in-home re-certification for PT. Visits to be twice a week for 8 weeks, then once a week for one week.  Please advise.   Call back phone # [phone] Rush Landmark).

## 2020-08-21 DIAGNOSIS — Z9181 History of falling: Secondary | ICD-10-CM

## 2020-08-21 DIAGNOSIS — K592 Neurogenic bowel, not elsewhere classified: Secondary | ICD-10-CM

## 2020-08-21 DIAGNOSIS — M109 Gout, unspecified: Secondary | ICD-10-CM

## 2020-08-21 DIAGNOSIS — Z9049 Acquired absence of other specified parts of digestive tract: Secondary | ICD-10-CM

## 2020-08-21 DIAGNOSIS — Z466 Encounter for fitting and adjustment of urinary device: Secondary | ICD-10-CM

## 2020-08-21 DIAGNOSIS — G8252 Quadriplegia, C1-C4 incomplete: Secondary | ICD-10-CM | POA: Diagnosis not present

## 2020-08-21 DIAGNOSIS — Z79899 Other long term (current) drug therapy: Secondary | ICD-10-CM

## 2020-08-21 DIAGNOSIS — K760 Fatty (change of) liver, not elsewhere classified: Secondary | ICD-10-CM

## 2020-08-21 DIAGNOSIS — K219 Gastro-esophageal reflux disease without esophagitis: Secondary | ICD-10-CM

## 2020-08-21 DIAGNOSIS — Z9841 Cataract extraction status, right eye: Secondary | ICD-10-CM

## 2020-08-21 DIAGNOSIS — E782 Mixed hyperlipidemia: Secondary | ICD-10-CM | POA: Diagnosis not present

## 2020-08-21 DIAGNOSIS — M48062 Spinal stenosis, lumbar region with neurogenic claudication: Secondary | ICD-10-CM

## 2020-08-21 DIAGNOSIS — S14123D 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, subsequent encounter: Secondary | ICD-10-CM | POA: Diagnosis not present

## 2020-08-21 DIAGNOSIS — E1169 Type 2 diabetes mellitus with other specified complication: Secondary | ICD-10-CM | POA: Diagnosis not present

## 2020-08-21 DIAGNOSIS — N319 Neuromuscular dysfunction of bladder, unspecified: Secondary | ICD-10-CM

## 2020-08-21 DIAGNOSIS — M1990 Unspecified osteoarthritis, unspecified site: Secondary | ICD-10-CM

## 2020-08-21 DIAGNOSIS — Z981 Arthrodesis status: Secondary | ICD-10-CM

## 2020-08-21 DIAGNOSIS — Z960 Presence of urogenital implants: Secondary | ICD-10-CM

## 2020-08-23 ENCOUNTER — Encounter: Payer: Medicare PPO | Attending: Physical Medicine and Rehabilitation | Admitting: Physical Medicine and Rehabilitation

## 2020-08-23 ENCOUNTER — Other Ambulatory Visit: Payer: Self-pay

## 2020-08-23 ENCOUNTER — Encounter: Payer: Self-pay | Admitting: Physical Medicine and Rehabilitation

## 2020-08-23 VITALS — BP 111/78 | HR 68 | Temp 98.4°F

## 2020-08-23 DIAGNOSIS — Z993 Dependence on wheelchair: Secondary | ICD-10-CM | POA: Insufficient documentation

## 2020-08-23 DIAGNOSIS — M792 Neuralgia and neuritis, unspecified: Secondary | ICD-10-CM | POA: Diagnosis not present

## 2020-08-23 DIAGNOSIS — R252 Cramp and spasm: Secondary | ICD-10-CM | POA: Insufficient documentation

## 2020-08-23 DIAGNOSIS — G8250 Quadriplegia, unspecified: Secondary | ICD-10-CM | POA: Insufficient documentation

## 2020-08-23 DIAGNOSIS — N319 Neuromuscular dysfunction of bladder, unspecified: Secondary | ICD-10-CM | POA: Insufficient documentation

## 2020-08-23 MED ORDER — BACLOFEN 10 MG PO TABS: 10.0000 mg | ORAL_TABLET | Freq: Four times a day (QID) | ORAL | 5 refills | Status: DC

## 2020-08-23 NOTE — Progress Notes (Signed)
Subjective:    Patient ID: Meredith Roach, female    DOB: 1941-02-17, 80 y.o.   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  Pt is here for f/u on SCI and spasticity.   Doing OK-  Working on walking with therapy with RW! L hand is still not opening as much as R side.   L hand is still real tight and the L leg- worse some days than others.   Got [facility] 12/21 and has been changed twice (upsized)-  Is at a 24 french and Urology is changing since has some resistance.  No sediment/blockage of SPC.   Pain comes and goes.   Got lightheaded with Zanaflex and insurance wouldn't cover Baclofen and Zanaflex. So stopped Zanaflex.  More tightness/spasms at night.    Pain Inventory Average Pain 2 Pain Right Now 2 My pain is intermittent and aching  In the last 24 hours, has pain interfered with the following? General activity 0 Relation with others 0 Enjoyment of life 0 What TIME of day is your pain at its worst? night Sleep (in general) Good  Pain is worse with: . Pain improves with: medication Relief from Meds: 7  Family History  Problem Relation Age of Onset  . Breast cancer Maternal Aunt   . Breast cancer Paternal Aunt   . CAD Father   . Heart attack Father    Social History   Socioeconomic History  . Marital status: Married    Spouse name: [name]  . Number of children: 2  . Years of education: Not on file  . Highest education level: Not on file  Occupational History  . Not on file  Tobacco Use  . Smoking status: Never Smoker  . Smokeless tobacco: Never Used  Vaping Use  . Vaping Use: Never used  Substance and Sexual Activity  . Alcohol use: No  . Drug use: No  . Sexual activity: Not Currently  Other Topics Concern  . Not on file  Social History Narrative    Lives with daughter   Social Determinants of Health   Financial Resource Strain: Not on file  Food Insecurity: Not on file  Transportation Needs: Not on file  Physical Activity: Not on file  Stress: Not on file  Social Connections: Not on file   Past Surgical History:  Procedure Laterality Date  . ANTERIOR CERVICAL DECOMPRESSION/DISCECTOMY FUSION 4 LEVELS N/A 08/28/2019   Procedure: ANTERIOR CERVICAL DECOMPRESSION/DISCECTOMY FUSION CERVICALTHREE-FOUR CERVICAL,FOUR-FIVE,CERVICAL FIVE-SIX,CERVICAL SIX-SEVEN.;  Surgeon: [name], MD;  Location: [place];  Service: Neurosurgery;  Laterality: N/A;  ANTERIOR  . BACK SURGERY    . CATARACT EXTRACTION W/PHACO Right 03/31/2018   Procedure: CATARACT EXTRACTION PHACO AND INTRAOCULAR LENS PLACEMENT ([place]);  Surgeon: [name], MD;  Location: [facility];  Service: Ophthalmology;  Laterality: Right;  [place] 00:39.7 CDE 4.35 Fluid Pack Lot # [id number]  . CHOLECYSTECTOMY  1995  . COLONOSCOPY WITH PROPOFOL N/A 06/07/2017   Procedure: COLONOSCOPY WITH PROPOFOL;  Surgeon: [name], MD;  Location[facility]COPY;  Service: Endoscopy;  Laterality: N/A;  . ESOPHAGOGASTRODUODENOSCOPY (EGD) WITH PROPOFOL N/A 06/07/2017   Procedure: ESOPHAGOGASTRODUODENOSCOPY (EGD) WITH PROPOFOL;  Surgeon: [name], MD;  Location: [facility] ENDOSCOPY;  Service: Endoscopy;  Laterality: N/A;  . IR CATHETER TUBE CHANGE  05/10/2020  . REDUCTION MAMMAPLASTY Bilateral 1994   Past Surgical History:  Procedure Laterality Date  . ANTERIOR CERVICAL DECOMPRESSION/DISCECTOMY FUSION 4 LEVELS N/A 08/28/2019   Procedure: ANTERIOR CERVICAL DECOMPRESSION/DISCECTOMY FUSION CERVICALTHREE-FOUR CERVICAL,FOUR-FIVE,CERVICAL FIVE-SIX,CERVICAL SIX-SEVEN.;  Surgeon: [name], MD;  Location: [place];  Service: Neurosurgery;  Laterality: N/A;  ANTERIOR  . BACK SURGERY    . CATARACT EXTRACTION W/PHACO Right 03/31/2018   Procedure: CATARACT EXTRACTION PHACO AND INTRAOCULAR LENS PLACEMENT  ([place]);  Surgeon: [name], MD;  Location: [facility];  Service: Ophthalmology;  Laterality: Right;  [place] 00:39.7 CDE 4.35 Fluid Pack Lot # [id number] H  . CHOLECYSTECTOMY  1995  . COLONOSCOPY WITH PROPOFOL N/A 06/07/2017   Procedure: COLONOSCOPY WITH PROPOFOL;  Surgeon: [name], MD;  [facility];  Service: Endoscopy;  Laterality: N/A;  . ESOPHAGOGASTRODUODENOSCOPY (EGD) WITH PROPOFOL N/A 06/07/2017   Procedure: ESOPHAGOGASTRODUODENOSCOPY (EGD) WITH PROPOFOL;  Surgeon: [name], MD;  Location: [facility] ENDOSCOPY;  Service: Endoscopy;  Laterality: N/A;  . IR CATHETER TUBE CHANGE  05/10/2020  . REDUCTION MAMMAPLASTY Bilateral 1994   Past Medical History:  Diagnosis Date  . Arthritis   . GERD (gastroesophageal reflux disease)   . Gout   . Neuromuscular disorder ([place])   . Pre-diabetes    BP 111/78   Pulse 68   Temp 98.4 F (36.9 C)   SpO2 93%   Opioid Risk Score:   Fall Risk Score:  `1  Depression screen PHQ 2/9  Depression screen [facility] 2/9 05/24/2020 01/15/2020 11/27/2019 10/11/2019  Decreased Interest 0 0 0 0  Down, Depressed, Hopeless 0 0 0 0  PHQ - 2 Score 0 0 0 0    Review of Systems  Constitutional: Negative.   HENT: Negative.   Eyes: Negative.   Respiratory: Negative.   Cardiovascular: Negative.   Gastrointestinal: Negative.   Endocrine: Negative.   Genitourinary: Negative.   Musculoskeletal: Negative.   Skin: Negative.   Allergic/Immunologic: Negative.   Neurological: Positive for weakness and numbness.       Tingling   Psychiatric/Behavioral: Negative.   All other systems reviewed and are negative.      Objective:   Physical Exam  Awake, alert, appropriate, in power w/c, accompanied by family member, who's in scrubs, bright affect, NAD LUE MAS of hand; and MAS of 2 in L wrist, but elbow is only MAS of 1+ and shoulder is 1+- even has external rotation.  RUE- MAS of 1+ in R hand/fingers; MAS of 1 for wrist and elbow and shoulder   Hoffman's B/L UEs LEs:  L>R spasticity- MAS of 1+ in RLE and MAS of 2 in LLE Has 4-5 beats clonus on LLE, not on RLE Losing ROM of B/L elbows- lacking 20-30 degrees in elbows B/L  Constantly doing ROM of L hand Feet  And legs cool to touch and slightly purplish Has SPC in place- looks OK- dressing around site.     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 And worsening spasticity.  Injury was 08/23/19! So has bene 1 year!!  1. Will send to Dr Letta Pate to get Botox. Of L hand- getting tighter- and concerned she will lose ROM and get contracture of L hand  2. Needs to work on ROM of elbows B/L- is losing Range and concerned will interfere with  using RW. Work on 3-5x/day.   3. Will TRY to increase Baclofen to 10 mg during day and afternoon doses and increase to 20 mg nightly.  And see how that works- to try and avoid daytime sedation.   4. Stop Zanaflex since not taking. Don't want to  add to side effects-   5. Doesn't need refills on Duloxetine or gabapentin- will con't these meds.     6. F/U in 2 months for me and Botox by Dr Letta Pate.   I spent a total of 25 minutes on appt- discussing Botox- discussed onset/max effect, and how it works- also generalized spasticity.

## 2020-08-23 NOTE — Patient Instructions (Signed)
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 And worsening spasticity.  Injury was 08/23/19! So has bene 1 year!!  1. Will send to Dr Letta Pate to get Botox. Of L hand- getting tighter- and concerned she will lose ROM and get contracture of L hand  2. Needs to work on ROM of elbows B/L- is losing Range and concerned will interfere with using RW. Work on 3-5x/day.   3. Will TRY to increase Baclofen to 10 mg during day and afternoon doses and increase to 20 mg nightly.  And see how that works- to try and avoid daytime sedation.   4. Stop Zanaflex since not taking.   5. Doesn't need refills on Duloxetine or gabapentin- will con't these meds.     6. F/U in 2 months for me and Botox by Dr Letta Pate.

## 2020-09-02 ENCOUNTER — Ambulatory Visit: Payer: Medicare PPO | Admitting: Urology

## 2020-09-15 NOTE — Progress Notes (Signed)
Suprapubic Cath Change  Patient is present today for a suprapubic catheter change due to urinary retention.  Site was cleaned and prepped in a sterile fashion with betadine. A stiff wire was advanced through the current indwelling 16 FR council tip catheter.  12 ml of water was drained from the balloon, a 16 FR council tip catheter foley cath was removed from the tract with out difficulty.  A 16 FR council tip foley cath was replaced into the tract no complications were noted. Urine return was noted, 10 ml of sterile water was inflated into the balloon and a night baf was attached for drainage.  Patient tolerated well.   Performed by: Salena Saner. Smith CMA and Zara Council, PA-C  Follow up: 30 days for SPT exchange

## 2020-09-16 ENCOUNTER — Encounter: Payer: Self-pay | Admitting: Urology

## 2020-09-16 ENCOUNTER — Ambulatory Visit (INDEPENDENT_AMBULATORY_CARE_PROVIDER_SITE_OTHER): Payer: Medicare PPO | Admitting: Urology

## 2020-09-16 ENCOUNTER — Other Ambulatory Visit: Payer: Self-pay

## 2020-09-16 VITALS — BP 115/72 | HR 76 | Ht 62.0 in | Wt 190.0 lb

## 2020-09-16 DIAGNOSIS — Z9359 Other cystostomy status: Secondary | ICD-10-CM | POA: Diagnosis not present

## 2020-10-04 ENCOUNTER — Encounter: Payer: Medicare PPO | Attending: Physical Medicine and Rehabilitation | Admitting: Physical Medicine & Rehabilitation

## 2020-10-04 ENCOUNTER — Other Ambulatory Visit: Payer: Self-pay

## 2020-10-04 ENCOUNTER — Encounter: Payer: Self-pay | Admitting: Physical Medicine & Rehabilitation

## 2020-10-04 VITALS — BP 106/68 | HR 82 | Temp 99.2°F | Ht 62.0 in | Wt 190.0 lb

## 2020-10-04 DIAGNOSIS — G8250 Quadriplegia, unspecified: Secondary | ICD-10-CM | POA: Diagnosis not present

## 2020-10-04 NOTE — Progress Notes (Signed)
Botox Injection for spasticity using needle EMG guidance  Patient referred by Dr. Alice Rieger for botulinum toxin injection to control severe spasticity left upper extremity.  Patient has some residual grasp function that she uses for bimanual holding.  Her main affected extremity is left upper. Per MD note spasticity affecting the hand is the most problematic. Discussed average duration of Botox as well as potential effects of excessive weakness.  The patient would like to proceed.  We discussed the average onset is 1 to 2 weeks.  Dilution: 50 Units/ml Indication: Severe spasticity which interferes with ADL,mobility and/or  hygiene and is unresponsive to medication management and other conservative care Informed consent was obtained after describing risks and benefits of the procedure with the patient. This includes bleeding, bruising, infection, excessive weakness, or medication side effects. A REMS form is on file and signed. Needle: 27g 1" needle electrode Number of units per muscle Pectoralis0 Biceps0 FCR0 FCU0 FDS50 FDP50 FPL0 Lumbricals 25 x 3=75 Waste 25u All injections were done after obtaining appropriate EMG activity and after negative drawback for blood. The patient tolerated the procedure well. Post procedure instructions were given.  The patient has an appointment with Dr. Dagoberto Ligas on October 25, 2020 for follow-up.  May reinject Botox no earlier than 01/04/2021

## 2020-10-04 NOTE — Patient Instructions (Signed)

## 2020-10-18 NOTE — Progress Notes (Signed)
Suprapubic Cath Change  Patient is present today for a suprapubic catheter change due to urinary retention.  Site was cleaned and prepped in a sterile fashion with betadine.  9 ml of water was drained from the balloon, a 9 FR foley cath was removed from the tract over a guide wire with out difficulty.  A 16 council tip FR foley cath was replaced into the tract no complications were noted. Urine return was noted, 10 ml of sterile water was inflated into the balloon and a night bag was attached for drainage.  Patient tolerated well.   Performed by: Zara Council, PA-C and Edwin Dada, CMA  Follow up: One month for SPT exchange with Dr. Erlene Quan.  I feel like there is a secure SP tract into her bladder, but I would like her to have the first exchange of the SPT without a guide wire with Dr. Erlene Quan in case a cystoscope needs to be passed if the tract is lost.     Of note, her husband tripped over his own feet in the room and fell to the ground.  He suffered very minor injuries.  Safety Zone report was given on the web site.

## 2020-10-21 ENCOUNTER — Other Ambulatory Visit: Payer: Self-pay

## 2020-10-21 ENCOUNTER — Encounter: Payer: Self-pay | Admitting: Urology

## 2020-10-21 ENCOUNTER — Ambulatory Visit: Payer: Medicare PPO | Admitting: Urology

## 2020-10-21 VITALS — BP 117/78 | HR 101 | Ht 63.0 in | Wt 190.0 lb

## 2020-10-21 DIAGNOSIS — Z9359 Other cystostomy status: Secondary | ICD-10-CM

## 2020-10-25 ENCOUNTER — Encounter: Payer: Self-pay | Admitting: Physical Medicine and Rehabilitation

## 2020-10-25 ENCOUNTER — Encounter: Payer: Medicare PPO | Attending: Physical Medicine and Rehabilitation | Admitting: Physical Medicine and Rehabilitation

## 2020-10-25 ENCOUNTER — Other Ambulatory Visit: Payer: Self-pay

## 2020-10-25 VITALS — BP 110/77 | HR 82 | Temp 98.5°F | Ht 63.0 in | Wt 195.0 lb

## 2020-10-25 DIAGNOSIS — G8250 Quadriplegia, unspecified: Secondary | ICD-10-CM | POA: Diagnosis present

## 2020-10-25 DIAGNOSIS — Z993 Dependence on wheelchair: Secondary | ICD-10-CM | POA: Insufficient documentation

## 2020-10-25 DIAGNOSIS — R6889 Other general symptoms and signs: Secondary | ICD-10-CM | POA: Diagnosis present

## 2020-10-25 DIAGNOSIS — R252 Cramp and spasm: Secondary | ICD-10-CM | POA: Insufficient documentation

## 2020-10-25 DIAGNOSIS — M792 Neuralgia and neuritis, unspecified: Secondary | ICD-10-CM | POA: Insufficient documentation

## 2020-10-25 MED ORDER — GABAPENTIN 300 MG PO CAPS: 900.0000 mg | ORAL_CAPSULE | Freq: Four times a day (QID) | ORAL | 5 refills | Status: DC

## 2020-10-25 MED ORDER — BACLOFEN 10 MG PO TABS: 10.0000 mg | ORAL_TABLET | Freq: Four times a day (QID) | ORAL | 5 refills | Status: DC

## 2020-10-25 NOTE — Progress Notes (Signed)
Subjective:    Patient ID: Meredith Roach, female    DOB: Oct 21, 1940, 80 y.o.   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 And worsening spasticity.   Injury was 08/23/19!  Pt had Botox of LUE/L hand since getting contracture of L hand.   Botox of LUE- 10/04/20.  Pectoralis0 Biceps0 FCR0 FCU0 FDS50 FDP50 FPL0 Lumbricals 25 x 3=75    So, having more LLE pain- start in L foot/toes, and goes up to L groin-  also has associated L buttock pain.  LLE so tight, feel like might splint in half.   Also associated swelling of LLE- (due to weakness) when leg is hanging down.  Currently sitting with legs down B/L.   Also has associated burning- and starts at toe.  Taking Baclofen 10 mg QID- once takes Baclofen for a little while, has sweating and some sleepiness- not bad enough doesn't want to increase meds.   Botox has really helped L hand- put on a board and strap fingers outwards-   H/H PT is about to stop.   Pain Inventory Average Pain 8 Pain Right Now 7 My pain is constant, sharp, burning, and stings  LOCATION OF PAIN  Left leg, Leg hip, left knee, left ankle, left foot  BOWEL Number of stools per week: 7 Oral laxative use No  Type of laxative none   Enema or suppository use No  History of colostomy No  Incontinent No   BLADDER Foley In and out cath, frequency foley in place Able to self cath No  Bladder incontinence No  Frequent urination No  Leakage with coughing No  Difficulty starting stream No  Incomplete bladder emptying No    Mobility use a walker how many minutes can you walk? 10 minutes ability to climb steps?  no do you drive?  no use a wheelchair transfers alone Do you have any goals in this area?  yes  Function retired I need assistance with the following:  feeding,  dressing, bathing, toileting, meal prep, household duties, and shopping Do you have any goals in this area?  yes  Neuro/Psych bladder control problems weakness numbness tremor tingling trouble walking spasms  Prior Studies Any changes since last visit?  no  Physicians involved in your care Any changes since last visit?  no   Family History  Problem Relation Age of Onset   Breast cancer Maternal Aunt    Breast cancer Paternal Aunt    CAD Father    Heart attack Father    Social History   Socioeconomic History   Marital status: Married    Spouse name: [name]   Number of children: 2   Years of education: Not on file   Highest education level: Not on file  Occupational History   Not on file  Tobacco Use   Smoking status: Never   Smokeless tobacco: Never  Vaping Use   Vaping Use: Never used  Substance and Sexual Activity   Alcohol use: No   Drug use: No   Sexual activity: Not Currently  Other Topics Concern   Not on file  Social History Narrative   Lives with daughter   Social Determinants of Health   Financial Resource Strain: Not on file  Food Insecurity: Not on file  Transportation Needs: Not on file  Physical Activity: Not on file  Stress: Not on file  Social Connections: Not on file   Past Surgical History:  Procedure Laterality Date   ANTERIOR CERVICAL DECOMPRESSION/DISCECTOMY FUSION 4 LEVELS N/A 08/28/2019   Procedure: ANTERIOR CERVICAL DECOMPRESSION/DISCECTOMY FUSION CERVICALTHREE-FOUR CERVICAL,FOUR-FIVE,CERVICAL FIVE-SIX,CERVICAL SIX-SEVEN.;  Surgeon: [name], MD;  Location: [place];  Service: Neurosurgery;  Laterality: N/A;  ANTERIOR   BACK SURGERY     CATARACT EXTRACTION W/PHACO Right 03/31/2018   Procedure: CATARACT EXTRACTION PHACO AND INTRAOCULAR LENS PLACEMENT ([place]);  Surgeon: [name], MD;  Location: [facility];  Service: Ophthalmology;  Laterality: Right;  [place] 00:39.7 CDE 4.35 Fluid Pack Lot # [id number]   CHOLECYSTECTOMY  1995    COLONOSCOPY WITH PROPOFOL N/A 06/07/2017   Procedure: COLONOSCOPY WITH PROPOFOL;  Surgeon: [name], MD;  Location[facility];  Service: Endoscopy;  Laterality: N/A;   ESOPHAGOGASTRODUODENOSCOPY (EGD) WITH PROPOFOL N/A 06/07/2017   Procedure: ESOPHAGOGASTRODUODENOSCOPY (EGD) WITH PROPOFOL;  Surgeon: [name], MD;  [facility] ENDOSCOPY;  Service: Endoscopy;  Laterality: N/A;   IR CATHETER TUBE CHANGE  05/10/2020   REDUCTION MAMMAPLASTY Bilateral 1994   Past Medical History:  Diagnosis Date   Arthritis    GERD (gastroesophageal reflux disease)    Gout    Neuromuscular disorder (HCC)    Pre-diabetes    BP 110/77   Pulse 82   Temp 98.5 F (36.9 C)   Ht 5\' 3"  (1.6 m)   Wt 195 lb (88.5 kg) Comment: per pt in wheelchair  SpO2 96%   BMI 34.54 kg/m   Opioid Risk Score:   Fall Risk Score:  `1  Depression screen PHQ 2/9  Depression screen [facility] 2/9 10/25/2020 10/04/2020 05/24/2020 01/15/2020 11/27/2019 10/11/2019  Decreased Interest 0 0 0 0 0 0  Down, Depressed, Hopeless 0 0 0 0 0 0  PHQ - 2 Score 0 0 0 0 0 0    Review of Systems  Genitourinary:        Foley in place  Musculoskeletal:  Positive for gait problem.  Skin:        Left heel red tender  Neurological:  Positive for tremors, weakness and numbness.  All other systems reviewed and are negative.     Objective:   Physical Exam Awake, alert, appropriate, in power w/c- accompanied by husband, NAD Joystick on R Wearing tube for suprapubic catheter on L leg.  MAS of 3in LLE- at knee and 3 in L hip MAS of 3-4 in L ankle and Clonus 5-6 beats on LLE  RLE- MAS of 1+ in R knee, hip and ankle- no clonus LUE- L hand- fingers much more straight- not curled- however has a hair of flexion/contracture at MCPs and PIPs on L hand, but < 5 degrees.  MAS of 1+ in L fingers and hand and wrist- MAS of 1+ to 2 in L elbow RUE- no increased tone.  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 And worsening spasticity.   Injury was 08/23/19!  Increase Gabapentin  to 4x/day x 3-4 days, then if tolerated, increase to 900 mg 4x/day- for nerve pain.  If you have side  effects, let me know! So can try something else.  THEN increase baclofen to 15 mg 4x/day for spasticity! If either the Baclofen of Gabapentin increase causes sides effects- more than mild side effects, CALL or My chart me. If this doesn't work- will have to add something else for nerve pain/spasticity.  6.  DTI on L heel- Take a picture daily for at least 7 days- if the skin starts to open up- let me know and can consult wound care; if not, its likely a bruise.  7. Outpt PT eval and treat- 8-12 weeks 2-3x/week- for SCI/strengthening, endurance, ROM and transfers/walking? 8. F/U in 3 months- double visit- SCI   I spent a total of 37 minutes on visit- discussing nerve pain, Botox- additional therapy and DTI as well as spasticity and nerve pain.

## 2020-10-25 NOTE — Patient Instructions (Signed)
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 And worsening spasticity.   Injury was 08/23/19!  Increase Gabapentin  to 4x/day x 3-4 days, then if tolerated, increase to 900 mg 4x/day- for nerve pain.  If you have side effects, let me know! So can try something else.  THEN increase baclofen to 15 mg 4x/day for spasticity! If either the Baclofen of Gabapentin increase causes sides effects- more than mild side effects, CALL or My chart me. If this doesn't work- will have to add something else for nerve pain/spasticity.  6.  Take a picture daily for at least 7 days- if the skin starts to open up- let me know and can consult wound care; if not, its likely a bruise.  7. Outpt PT eval and treat- 8-12 weeks 2-3x/week- for SCI/strengthening, endurance, ROM and transfers/walking? 8. F/U in 3 months- double visit- SCI

## 2020-11-04 ENCOUNTER — Ambulatory Visit: Payer: Medicare PPO | Admitting: Physical Therapy

## 2020-11-06 ENCOUNTER — Encounter: Payer: Medicare PPO | Admitting: Physical Therapy

## 2020-11-11 ENCOUNTER — Encounter: Payer: Medicare PPO | Admitting: Physical Therapy

## 2020-11-13 ENCOUNTER — Encounter: Payer: Medicare PPO | Admitting: Physical Therapy

## 2020-11-18 ENCOUNTER — Encounter: Payer: Medicare PPO | Admitting: Physical Therapy

## 2020-11-20 ENCOUNTER — Encounter: Payer: Medicare PPO | Admitting: Physical Therapy

## 2020-11-25 ENCOUNTER — Encounter: Payer: Medicare PPO | Admitting: Physical Therapy

## 2020-11-26 ENCOUNTER — Encounter: Payer: Self-pay | Admitting: Physical Therapy

## 2020-11-26 ENCOUNTER — Other Ambulatory Visit: Payer: Self-pay

## 2020-11-26 ENCOUNTER — Ambulatory Visit: Payer: Medicare PPO | Attending: Physical Medicine and Rehabilitation | Admitting: Physical Therapy

## 2020-11-26 DIAGNOSIS — S14123A 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, initial encounter: Secondary | ICD-10-CM | POA: Insufficient documentation

## 2020-11-26 DIAGNOSIS — M6281 Muscle weakness (generalized): Secondary | ICD-10-CM | POA: Diagnosis present

## 2020-11-26 DIAGNOSIS — R262 Difficulty in walking, not elsewhere classified: Secondary | ICD-10-CM | POA: Insufficient documentation

## 2020-11-26 NOTE — Therapy (Signed)
[facility] [address]. [place], [place], [address] Phone: [phone]   Fax:  [phone]  Physical Therapy Evaluation  Patient Details  Name: Meredith Roach MRN: [id number] Date of Birth: Apr 27, 1940 Referring Provider (PT): [name], MD  Encounter Date: 11/26/2020   PT End of Session - 11/26/20 1445     Visit Number 1    Number of Visits 31    Date for PT Re-Evaluation 03/18/21    Authorization - Visit Number 1    Progress Note Due on Visit 10    PT Start Time 1340    PT Stop Time 1428    PT Time Calculation (min) 48 min    Equipment Utilized During Treatment Gait belt   Pt uses power wheelchair, clinic's front-wheeled walker utilized for gait   Activity Tolerance Patient tolerated treatment well    Behavior During Therapy WFL for tasks assessed/performed             Past Medical History:  Diagnosis Date   Arthritis    GERD (gastroesophageal reflux disease)    Gout    Neuromuscular disorder ([place])    Pre-diabetes     Past Surgical History:  Procedure Laterality Date   ANTERIOR CERVICAL DECOMPRESSION/DISCECTOMY FUSION 4 LEVELS N/A 08/28/2019   Procedure: ANTERIOR CERVICAL DECOMPRESSION/DISCECTOMY FUSION CERVICALTHREE-FOUR CERVICAL,FOUR-FIVE,CERVICAL FIVE-SIX,CERVICAL SIX-SEVEN.;  Surgeon: [name], MD;  Location: [place];  Service: Neurosurgery;  Laterality: N/A;  ANTERIOR   BACK SURGERY     CATARACT EXTRACTION W/PHACO Right 03/31/2018   Procedure: CATARACT EXTRACTION PHACO AND INTRAOCULAR LENS PLACEMENT ([place]);  Surgeon: [name], MD;  Location: [facility];  Service: Ophthalmology;  Laterality: Right;  [place] 00:39.7 CDE 4.35 Fluid Pack Lot # [id number]   CHOLECYSTECTOMY  1995   COLONOSCOPY WITH PROPOFOL N/A 06/07/2017   Procedure: COLONOSCOPY WITH PROPOFOL;  Surgeon: [name], MD;  Location: [facility];  Service: Endoscopy;  Laterality: N/A;   ESOPHAGOGASTRODUODENOSCOPY (EGD) WITH PROPOFOL N/A  06/07/2017   Procedure: ESOPHAGOGASTRODUODENOSCOPY (EGD) WITH PROPOFOL;  Surgeon: [name], MD;  Location: [facility]COPY;  Service: Endoscopy;  Laterality: N/A;   IR CATHETER TUBE CHANGE  05/10/2020   REDUCTION MAMMAPLASTY Bilateral 1994    There were no vitals filed for this visit.    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 Difficulty walking, transferring, and completing independent functional mobility following incomplete SCI (DOI: 08/26/19)    Patient is accompained by: Family member   husband   Pertinent History Patient is a 80 year old female s/p incomplete SCI. She has undergone home health therapy for about one year. She and her husband reports she was able to transfer and ambulate with walker in home with PT walking beside her. Pt has practiced with front-wheel walker at home. 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 Patient reports using steady transfer aid for performing transfers chair to bed and W/C to standard chair. Her husband states that she is able to stand with steady device and can stand with her legs against the bed. Pt has ramp to get in her home for her power wheelchair. Patient lives in one level home. Pt uses tub bench that she can utilize to slide into shower; her daughter/husband assists with bathing. Pt has Lucianne Lei that can accommodate her power chair at this time. Pt has concrete driveway to get up to her ramp. Pt has small threshold to  get into her home - husband utilizes cardboard to get over threshold.    Limitations Walking;Standing;House hold activities;Other (comment)   transferring, bed mobility, bladder management   Patient Stated Goals to be able to walk more independently                [facility] PT Assessment - 11/26/20 1750       Assessment   Medical Diagnosis Spasticity, quadriplegia, wheelchair dependence    Referring Provider (PT) [name], MD    Onset  Date/Surgical Date 08/26/19    Next MD Visit None scheduled c ref provider    Prior Therapy Hospital-based, rehab, home health      Precautions   Precautions Fall      Balance Screen   Has the patient fallen in the past 6 months No    Has the patient had a decrease in activity level because of a fear of falling?  No    Is the patient reluctant to leave their home because of a fear of falling?  No      Prior Function   Level of Independence Independent with basic ADLs      Cognition   Overall Cognitive Status Within Functional Limits for tasks assessed               SUBJECTIVE Chief complaint: Difficulty walking, transferring, and completing independent functional mobility following incomplete SCI (DOI: 08/26/19) Onset: Incomplete SCI following fall - C4 [place] D (DOI: 08/26/19). Patient is a 80 year old female s/p incomplete SCI. She has undergone home health therapy for about one year. She and her husband reports she was able to transfer and ambulate with walker in home with PT walking beside her. Pt has practiced with front-wheel walker at home. 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 Patient reports using steady transfer aid for performing transfers chair to bed and W/C to standard chair. Her husband states that she is able to stand with steady device and can stand with her legs against the bed. Pt has ramp to get in her home for her power wheelchair. Patient lives in one level home. Pt uses tub bench that she can utilize to slide into shower; her daughter/husband assists with bathing. Pt has Lucianne Lei that can accommodate her power chair at this time. Pt has concrete driveway to get up to her ramp. Pt has small threshold to get into her home - husband utilizes cardboard to get over threshold.  Patient goals: to be able to walk more independently Recent changes in overall health/medication: No  Prior history of physical therapy: home health PT for rehab following  SCI Follow-up appointment with MD: None scheduled    OBJECTIVE  MUSCULOSKELETAL: Tremor: Absent Bulk: Normal Tone: Clonus in bilateral lower extremity, increased tone of elbow flexors, wrist flexors, plantarflexors  Posture Forward head, rounded shoulders posture in chair, patient rests in posterior pelvic tilt  Trunk AROM WNL Anterior and posterior pelvic tilt, limited hip hiking bilaterally  Gait Gait x 40 feet with front-wheeled walker; decreased toe clearance L>RLE, decreased step length, decreased gait velocity, decreased terminal knee extension at terminal swing, dec hip/knee flexion during swing phase  Upper extremity AROM Shoulder Flexion 110 bilaterally Elbow extension -10 bilaterally Elbow flexion WFL bilaterally  Strength R/L 2+/2+ Shoulder flexion 4+/4 Biceps 4+/4- Triceps 2/2- Hip flexion 4+/4+ Hip abduction 4/4 Hip adduction 4/4 Knee extension 4+/4 Knee flexion 4-/4- Ankle Plantarflexion 4/4+ Ankle Dorsiflexion    NEUROLOGICAL:  Mental Status Patient is oriented to  person, place and time.  Recent memory is intact.  Remote memory is intact.  Attention span and concentration are intact.  Expressive speech is intact.  Patient's fund of knowledge is within normal limits for educational level.  Cranial Nerves Intermittent difficulty with swallowing following ACDF Visual acuity and visual fields are intact  Extraocular muscles are intact  Facial sensation is intact bilaterally  Facial strength is intact bilaterally  Hearing is normal as tested by gross conversation Shoulder shrug strength is intact    Sensation Decreased light touch sensation digits 1-5 LUE determined by testing dermatomes C2-T2/L2-S2 respectively Proprioception and hot/cold testing deferred on this date   Tone  Modified Ashworth Scale Biceps: R 2+, L 2+ Wrist flexors: R: 2 L: 3 Digit flexors 2nd-4th: R: 1 L: 2 Hamstrings: R: 1+, L: 1+ Plantarflexors: R: 1, L  1   Coordination/Cerebellar Finger to Nose: WNL Rapid alternating movements UE and LE: WNL Pronator Drift: Negative   FUNCTIONAL TASKS Stand Pivot Transfer: Max assist for power chair placement relative to low mat, contact guard assist for transfer with Mod assist to attain lift-off for sit to stand with heavy LUE assist to unweight pelvis and to control descent during stand to sit.     Objective measurements completed on examination: See above findings.     ASSESSMEMT Patient is a 80 year old female referred for physical therapy s/p C4 SCI (DOI: 08/26/19 with emergent C3-7 ACDF) with primary complaint of difficulty with walking and functional mobility. Pt presents with with current activity limitations in gait, transferring, static standing, pivoting to change body position or direction of gait, re-positioning in chair, and bed mobility. Pt presents with primary impairments including gait changes (dec toe clearance, dec step length, dec hip/knee flexion), upper and lower extremity weakness, L upper limb spasticity, increased tone in upper limb elbow/wrist/digit flexors, decreased trunk control. Pt's prognosis is enhanced by social support, patient motivation, [place] D on AIS. It diminished by advanced age, comorbidities. Pt will benefit from skilled PT intervention to address the aforementioned impairments and activity limitations for best return to PLOF and improved QoL.      PT Short Term Goals - 11/27/20 1346       PT SHORT TERM GOAL #1   Title Patient will perform stand-pivot transfer from chair to bed with supervision level of assist and ModI setup of power chair as needed for transferring at home    Baseline 11/26/20: Max assist for setup and mod assist to perform sit to stand, unsteady pivot prior to performing stand to sit    Time 4    Period Weeks    Status New    Target Date 12/24/20      PT SHORT TERM GOAL #2   Title Patient will perform standing toe tap to 3-inch surface  (Airex pad) with bilateral upper extremity support indicative of improved ability to clear feet from floor during stepping and improved weight shift to each LE with AD use    Baseline 11/26/20: Limited toe clearance during bilateral LE swing phase with front-wheeled walker    Time 3    Period Weeks    Status New    Target Date 12/17/20               PT Long Term Goals - 11/26/20 1752       PT LONG TERM GOAL #1   Title Patient will demonstrate improved function as evidenced by a score of 35 on FOTO measure for full participation in  activities at home and in the community.    Baseline 11/26/20: FOTO 12    Time 16    Period Weeks    Status New    Target Date 03/18/21      PT LONG TERM GOAL #2   Title Patient will ambulate for 150 feet without LOB, with sufficient toe clearance to prevent loss of gait stability, and c proper AD placement with least-restrictive assistive device with supervision level of assist    Baseline 11/26/20: Gait x 40 feet with CGA    Time 12    Period Weeks    Target Date 02/19/21      PT LONG TERM GOAL #3   Title Patient will perform independent re-positioning in chair with use of closed-chain triceps on armrests as needed for independent wheelchair mobility with no verbal cueing or tactile cueing required    Baseline 11/26/20: Dependent re-positioning in wheelchair    Time 8    Period Weeks    Status New    Target Date 01/22/21      PT LONG TERM GOAL #4   Title Patient will perform simulated step-over task for clearing doorway threshold with use of front-wheeled walker as needed for entering/exiting home with supervision level of assistance and no LOB    Baseline 11/26/20: Pt currently uses power wheelchair to negotiate threshold into home    Time 16    Period Weeks    Status New    Target Date 03/18/21                    Plan - 11/27/20 1359     Clinical Impression Statement Patient is a 80 year old female referred for physical therapy  s/p C4 SCI (DOI: 08/26/19 with emergent C3-7 ACDF) with primary complaint of difficulty with walking and functional mobility. Pt presents with with current activity limitations in gait, transferring, static standing, pivoting to change body position or direction of gait, re-positioning in chair, and bed mobility. Pt presents with primary impairments including gait changes (dec toe clearance, dec step length, dec hip/knee flexion), upper and lower extremity weakness, L upper limb spasticity, increased tone in upper limb elbow/wrist/digit flexors, decreased trunk control. Pt's prognosis is enhanced by social support, patient motivation, [place] D on AIS. It diminished by advanced age, comorbidities. Pt will benefit from skilled PT intervention to address the aforementioned impairments and activity limitations for best return to PLOF and improved QoL.    Personal Factors and Comorbidities Age;Comorbidity 3+;Time since onset of injury/illness/exacerbation    Comorbidities GERD, pre-diabetes, obesity, gout, s/p C3-7 ACDF    Examination-Activity Limitations Bathing;Continence;Reach Overhead;Stairs;Bed Mobility;Dressing;Self Feeding;Stand;Hygiene/Grooming;Toileting;Transfers;Locomotion Level    Examination-Participation Restrictions Community Activity;Yard Work;Laundry;Cleaning;Shop    Stability/Clinical Decision Making Evolving/Moderate complexity    Clinical Decision Making Moderate    Rehab Potential Fair    PT Frequency Other (comment)   2-3x/week   PT Duration Other (comment)   16 weeks   PT Treatment/Interventions Electrical Stimulation;Gait training;Stair training;Functional mobility training;Therapeutic activities;Therapeutic exercise;Balance training;Neuromuscular re-education;Wheelchair mobility training;Patient/family education    PT Next Visit Plan Gait re-training, task practice for transferring, LE strengthening, UE AROM and strengthening to assist with functional mobility and transfers    PT Home  Exercise Plan None established at initial evaluaiton    Consulted and Agree with Plan of Care Patient;Family member/caregiver    Family Member Consulted Husband             Patient will benefit from skilled therapeutic intervention in order to improve  the following deficits and impairments:  Abnormal gait, Decreased balance, Decreased mobility, Impaired sensation, Decreased strength  Visit Diagnosis: Difficulty in walking, not elsewhere classified  Muscle weakness (generalized)     Problem List Patient Active Problem List   Diagnosis Date Noted   Suspected deep tissue injury 10/25/2020   Wheelchair dependence 11/27/2019   Spasticity 11/27/2019   Hyperkalemia    Steroid-induced hyperglycemia    Prediabetes    Neurogenic bowel    Neurogenic bladder    Neuropathic pain    Quadriplegia ([place]) 08/31/2019   S/P cervical spinal fusion 08/28/2019   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 [place], [place], [address] Phone: [phone]   Fax:  [phone]  Name: Meredith Roach MRN: [id number] Date of Birth: [birth year]

## 2020-11-27 ENCOUNTER — Encounter: Payer: Medicare PPO | Admitting: Physical Therapy

## 2020-11-29 ENCOUNTER — Ambulatory Visit: Payer: Medicare PPO | Admitting: Urology

## 2020-11-29 ENCOUNTER — Other Ambulatory Visit: Payer: Self-pay

## 2020-11-29 ENCOUNTER — Encounter: Payer: Self-pay | Admitting: Urology

## 2020-11-29 VITALS — BP 116/62 | HR 88 | Ht 63.0 in | Wt 195.0 lb

## 2020-11-29 DIAGNOSIS — Z9359 Other cystostomy status: Secondary | ICD-10-CM | POA: Diagnosis not present

## 2020-11-29 DIAGNOSIS — N319 Neuromuscular dysfunction of bladder, unspecified: Secondary | ICD-10-CM

## 2020-11-29 NOTE — Progress Notes (Signed)
Suprapubic Cath Change  Patient is present today for a suprapubic catheter change due to urinary retention.  17m of water was drained from the balloon, a 16 council tip FR foley cath was removed from the tract with out difficulty.  Site was cleaned and prepped in a sterile fashion with betadine.  A 16 FR foley cath was replaced into the tract no complications were noted. Urine return was noted light pink, 10 ml of sterile water was inflated into the balloon and a night bag was attached for drainage.  Patient tolerated well. Granulation tissue noted at stoma site minor bleeding/discharge on dressing. Granulation tissue treated with Silver Nitrate.   Preformed by: SFonnie Jarvis CMA  Follow up: 1 month cath exchange

## 2020-11-30 ENCOUNTER — Other Ambulatory Visit: Payer: Self-pay | Admitting: Physical Medicine and Rehabilitation

## 2020-12-01 NOTE — Progress Notes (Signed)
   11/29/2020 1:00 PM   ADRIA SWAFFORD [birth year] [id number]  Referring provider: Rusty Aus, MD [facility] [place],  [place] [address]  Chief Complaint  Patient presents with   Follow-up    Cathether exchange    HPI: 80 year old female with neurogenic bladder chronic urinary retention who presents today for cath exchange.  Notably, she did have a very difficult suprapubic procedure requiring cystoscopic placement over wire and has had several changes over a wire which have become progressively easier.  She presents today for SP tube exchange without a wire.  She has been doing well.  No issues.  She no longer has home health.   Assessment & Plan:    1. Suprapubic catheter ([place]) SP tube exchanged easily today by CMA  Will continue simple SP tube exchanges on a monthly basis here in our office  2. Neurogenic bladder As above    Hollice Espy, MD  [facility] [address] [place], [place] [address] [phone]

## 2020-12-02 ENCOUNTER — Other Ambulatory Visit: Payer: Self-pay

## 2020-12-02 ENCOUNTER — Encounter: Payer: Medicare PPO | Admitting: Physical Therapy

## 2020-12-02 ENCOUNTER — Ambulatory Visit: Payer: Medicare PPO | Admitting: Physical Therapy

## 2020-12-02 DIAGNOSIS — M6281 Muscle weakness (generalized): Secondary | ICD-10-CM

## 2020-12-02 DIAGNOSIS — R262 Difficulty in walking, not elsewhere classified: Secondary | ICD-10-CM | POA: Diagnosis not present

## 2020-12-02 DIAGNOSIS — S14123A 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, initial encounter: Secondary | ICD-10-CM

## 2020-12-03 ENCOUNTER — Encounter: Payer: Self-pay | Admitting: Physical Therapy

## 2020-12-03 NOTE — Therapy (Signed)
[facility] [address]. [place], [place], [address] Phone: [phone]   Fax:  [phone]  Physical Therapy Treatment  Patient Details  Name: Meredith Roach MRN: [id number] Date of Birth: Aug 02, 1940 Referring Provider (PT): [name], MD   Encounter Date: 12/02/2020   PT End of Session - 12/03/20 0610     Visit Number 2    Number of Visits 32    Date for PT Re-Evaluation 03/18/21    Authorization - Visit Number 2    Progress Note Due on Visit 10    PT Start Time 1150    PT Stop Time 1238    PT Time Calculation (min) 48 min    Equipment Utilized During Treatment Gait belt   Pt uses power wheelchair, clinic's front-wheeled walker utilized for gait   Activity Tolerance Patient tolerated treatment well    Behavior During Therapy [facility] for tasks assessed/performed             Past Medical History:  Diagnosis Date   Arthritis    GERD (gastroesophageal reflux disease)    Gout    Neuromuscular disorder ([place])    Pre-diabetes     Past Surgical History:  Procedure Laterality Date   ANTERIOR CERVICAL DECOMPRESSION/DISCECTOMY FUSION 4 LEVELS N/A 08/28/2019   Procedure: ANTERIOR CERVICAL DECOMPRESSION/DISCECTOMY FUSION CERVICALTHREE-FOUR CERVICAL,FOUR-FIVE,CERVICAL FIVE-SIX,CERVICAL SIX-SEVEN.;  Surgeon: [name], MD;  Location: [facility];  Service: Neurosurgery;  Laterality: N/A;  ANTERIOR   BACK SURGERY     CATARACT EXTRACTION W/PHACO Right 03/31/2018   Procedure: CATARACT EXTRACTION PHACO AND INTRAOCULAR LENS PLACEMENT ([place]);  Surgeon: [name], MD;  Location: [facility];  Service: Ophthalmology;  Laterality: Right;  [place] 00:39.7 CDE 4.35 Fluid Pack Lot # [id number]   CHOLECYSTECTOMY  1995   COLONOSCOPY WITH PROPOFOL N/A 06/07/2017   Procedure: COLONOSCOPY WITH PROPOFOL;  Surgeon: [name], MD;  Location: [facility];  Service: Endoscopy;  Laterality: N/A;   ESOPHAGOGASTRODUODENOSCOPY (EGD) WITH PROPOFOL N/A  06/07/2017   Procedure: ESOPHAGOGASTRODUODENOSCOPY (EGD) WITH PROPOFOL;  Surgeon: [name], MD;  Location: [facility];  Service: Endoscopy;  Laterality: N/A;   IR CATHETER TUBE CHANGE  05/10/2020   REDUCTION MAMMAPLASTY Bilateral 1994    There were no vitals filed for this visit.   Subjective Assessment - 12/03/20 0554     Subjective Patient reports continued compliance with established HEP with home health. She is continuing to use device at home to maintain digit extension in her L hand. Patient reports no recent falls or major LOB. She reports tolerating her initial evaluation well.    Patient is accompained by: Family member   husband   Pertinent History Patient is a 80 year old female s/p incomplete SCI. She had incomplete SCI following fall - C4 [place] D (DOI: 08/26/19). Primary activity limitations c difficulty walking, transferring, and completing independent functional mobility following incomplete SCI. She has undergone home health therapy for about one year. She and her husband reports she was able to transfer and ambulate with walker in home with PT walking beside her. Pt has practiced with front-wheel walker at home. 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 Patient reports using steady transfer aid for performing transfers chair to bed and W/C to standard chair. Her husband states that she is able to stand with steady device and can stand with her legs against the bed. Pt has ramp to get in her home for her power wheelchair. Patient lives in one level  home. Pt uses tub bench that she can utilize to slide into shower; her daughter/husband assists with bathing. Pt has Lucianne Lei that can accommodate her power chair at this time. Pt has concrete driveway to get up to her ramp. Pt has small threshold to get into her home - husband utilizes cardboard to get over threshold.    Limitations Walking;Standing;House hold activities;Other (comment)   transferring, bed mobility, bladder  management   Patient Stated Goals to be able to walk more independently             Gait: Forward flexed posture onto FWW with decreased gait velocity, impaired toe-off with mild decreased toe clearance L>RLE, decreased step cadence   TREATMENT   Neuromuscular Re-education - for increased motor recruitment of major muscle groups with motor impairment, nervous system priming and exercises to promote trunk stability   Lower limb pawing in unsupported sitting position, edge of low table, alternating LE; x 5 min   LUE reaching and grasping in unsupported sitting position with cone stacking; x 5 min  In // bars:  Weight shifting, with unilateral upper limb support; x10 with each arm holding single parallel bar Pedal feet, with unilateral upper limb support; x10 with each arm holding single parallel bar Pedal feet, with unilateral upper limb support; x10 with each arm holding single parallel bar Standing march, with bilateral upper limb support; x20   Therapeutic Activities - patient education, repetitive task practice for improved performance of daily functional activities e.g. transferring  Bed mobility task practice: performed bilateral rolling with setup for upper and lower limbs as needed to promote independent bed mobility. Demo with instructions and dependent setup for limb placement with performance in each direction with moderate verval cueing for limb placement.  Stand pivot transfer to/from low mat with front-wheeled walker with MaxA for completion of sit to stand, CGA for pivot, and MinA for stand to sit. ModA for setup of power wheelchair prior to performance of transfer   Gait with FWW in clinic x 80 feet with single 180-degree turn; CGA throughout gait   ASSESSMENT Patient demonstrates notable fatigability of left upper limb with reaching activity. She is able to complete spherical and cylindrical grip to grasp cones, but she does have difficulty extending her digits  sufficient to conform to shape of cone. She exhibits fair trunk stability in sitting and has no LOB during gait. Pt has one episode of LOB during stand-pivot transferring with ModA required to maintain stability. Pt needs further work on motor control and motor recruitment for combined hip/knee flexion and ankle dorsiflexion to clear lower limbs during swing phase. Patient has remaining deficits in increased tone of wrist/digit flexors (L>RUE), impaired trunk control, decreased LE strength and motor recruitment, gait deficits/deviations, decreased L>R shoulder AROM, and impaired postural stability in standing. Patient will benefit from continued skilled therapeutic intervention to address the above deficits as needed for improved function and QoL.       PT Short Term Goals - 11/27/20 1346       PT SHORT TERM GOAL #1   Title Patient will perform stand-pivot transfer from chair to bed with supervision level of assist and ModI setup of power chair as needed for transferring at home    Baseline 11/26/20: Max assist for setup and mod assist to perform sit to stand, unsteady pivot prior to performing stand to sit    Time 4    Period Weeks    Status New    Target Date 12/24/20  PT SHORT TERM GOAL #2   Title Patient will perform standing toe tap to 3-inch surface (Airex pad) with bilateral upper extremity support indicative of improved ability to clear feet from floor during stepping and improved weight shift to each LE with AD use    Baseline 11/26/20: Limited toe clearance during bilateral LE swing phase with front-wheeled walker    Time 3    Period Weeks    Status New    Target Date 12/17/20               PT Long Term Goals - 11/26/20 1752       PT LONG TERM GOAL #1   Title Patient will demonstrate improved function as evidenced by a score of 35 on FOTO measure for full participation in activities at home and in the community.    Baseline 11/26/20: FOTO 12    Time 16    Period Weeks     Status New    Target Date 03/18/21      PT LONG TERM GOAL #2   Title Patient will ambulate for 150 feet without LOB, with sufficient toe clearance to prevent loss of gait stability, and c proper AD placement with least-restrictive assistive device with supervision level of assist    Baseline 11/26/20: Gait x 40 feet with CGA    Time 12    Period Weeks    Target Date 02/19/21      PT LONG TERM GOAL #3   Title Patient will perform independent re-positioning in chair with use of closed-chain triceps on armrests as needed for independent wheelchair mobility with no verbal cueing or tactile cueing required    Baseline 11/26/20: Dependent re-positioning in wheelchair    Time 8    Period Weeks    Status New    Target Date 01/22/21      PT LONG TERM GOAL #4   Title Patient will perform simulated step-over task for clearing doorway threshold with use of front-wheeled walker as needed for entering/exiting home with supervision level of assistance and no LOB    Baseline 11/26/20: Pt currently uses power wheelchair to negotiate threshold into home    Time 16    Period Weeks    Status New    Target Date 03/18/21                   Plan - 12/03/20 [id number]     Clinical Impression Statement Patient demonstrates notable fatigability of left upper limb with reaching activity. She is able to complete spherical and cylindrical grip to grasp cones, but she does have difficulty extending her digits sufficient to conform to shape of cone. She exhibits fair trunk stability in sitting and has no LOB during gait. Pt has one episode of LOB during stand-pivot transferring with ModA required to maintain stability. Pt needs further work on motor control and motor recruitment for combined hip/knee flexion and ankle dorsiflexion to clear lower limbs during swing phase. Patient has remaining deficits in increased tone of wrist/digit flexors (L>RUE), impaired trunk control, decreased LE strength and motor  recruitment, gait deficits/deviations, decreased L>R shoulder AROM, and impaired postural stability in standing. Patient will benefit from continued skilled therapeutic intervention to address the above deficits as needed for improved function and QoL.    Personal Factors and Comorbidities Age;Comorbidity 3+;Time since onset of injury/illness/exacerbation    Comorbidities GERD, pre-diabetes, obesity, gout, s/p C3-7 ACDF    Examination-Activity Limitations Bathing;Continence;Reach Overhead;Stairs;Bed Mobility;Dressing;Self Feeding;Stand;Hygiene/Grooming;Toileting;Transfers;Locomotion Level  Examination-Participation Restrictions Community Activity;Yard Work;Laundry;Cleaning;Shop    Stability/Clinical Decision Making Evolving/Moderate complexity    Rehab Potential Fair    PT Frequency Other (comment)   2-3x/week   PT Duration Other (comment)   16 weeks   PT Treatment/Interventions Electrical Stimulation;Gait training;Stair training;Functional mobility training;Therapeutic activities;Therapeutic exercise;Balance training;Neuromuscular re-education;Wheelchair mobility training;Patient/family education    PT Next Visit Plan Gait re-training, task practice for transferring, LE strengthening, UE AROM and strengthening to assist with functional mobility and transfers    PT Home Exercise Plan None established at initial evaluaiton, will update and supplement existing home health exercises with successive PT visits    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,  initial encounter [facility])     Problem List Patient Active Problem List   Diagnosis Date Noted   Suspected deep tissue injury 10/25/2020   Wheelchair dependence 11/27/2019   Spasticity 11/27/2019   Hyperkalemia    Steroid-induced hyperglycemia    Prediabetes    Neurogenic bowel    Neurogenic bladder    Neuropathic pain    Quadriplegia ([place]) 08/31/2019   S/P cervical spinal fusion 08/28/2019   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 [place], [place], [address] Phone: [phone]   Fax:  [phone]  Name: Meredith Roach MRN: [id number] Date of Birth: 1940/11/24

## 2020-12-04 ENCOUNTER — Ambulatory Visit: Payer: Medicare PPO | Admitting: Physical Therapy

## 2020-12-04 ENCOUNTER — Encounter: Payer: Medicare PPO | Admitting: Physical Therapy

## 2020-12-04 ENCOUNTER — Other Ambulatory Visit: Payer: Self-pay

## 2020-12-04 DIAGNOSIS — R262 Difficulty in walking, not elsewhere classified: Secondary | ICD-10-CM

## 2020-12-04 DIAGNOSIS — M6281 Muscle weakness (generalized): Secondary | ICD-10-CM

## 2020-12-04 DIAGNOSIS — S14123A 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, initial encounter: Secondary | ICD-10-CM

## 2020-12-04 NOTE — Therapy (Signed)
[facility] [address]. [place], [place], [address] Phone: [phone]   Fax:  [phone]  Physical Therapy Treatment  Patient Details  Name: Meredith Roach MRN: [id number] Date of Birth: [birth year] Referring Provider (PT): [name], MD   Encounter Date: 12/04/2020   PT End of Session - 12/04/20 1309     Visit Number 3    Number of Visits 22    Date for PT Re-Evaluation 03/18/21    Authorization - Visit Number 2    Progress Note Due on Visit 10    PT Start Time 1146    PT Stop Time [id number]    PT Time Calculation (min) 49 min    Equipment Utilized During Treatment Gait belt   Pt uses power wheelchair, clinic's front-wheeled walker utilized for gait   Activity Tolerance Patient tolerated treatment well    Behavior During Therapy WFL for tasks assessed/performed             Past Medical History:  Diagnosis Date   Arthritis    GERD (gastroesophageal reflux disease)    Gout    Neuromuscular disorder ([place])    Pre-diabetes     Past Surgical History:  Procedure Laterality Date   ANTERIOR CERVICAL DECOMPRESSION/DISCECTOMY FUSION 4 LEVELS N/A 08/28/2019   Procedure: ANTERIOR CERVICAL DECOMPRESSION/DISCECTOMY FUSION CERVICALTHREE-FOUR CERVICAL,FOUR-FIVE,CERVICAL FIVE-SIX,CERVICAL SIX-SEVEN.;  Surgeon: [name], MD;  Location: [place];  Service: Neurosurgery;  Laterality: N/A;  ANTERIOR   BACK SURGERY     CATARACT EXTRACTION W/PHACO Right 03/31/2018   Procedure: CATARACT EXTRACTION PHACO AND INTRAOCULAR LENS PLACEMENT ([place]);  Surgeon: [name], MD;  Location: [facility];  Service: Ophthalmology;  Laterality: Right;  [place] 00:39.7 CDE 4.35 Fluid Pack Lot # [id number]   CHOLECYSTECTOMY  1995   COLONOSCOPY WITH PROPOFOL N/A 06/07/2017   Procedure: COLONOSCOPY WITH PROPOFOL;  Surgeon: [name], MD;  Location: [facility] ENDOSCOPY;  Service: Endoscopy;  Laterality: N/A;   ESOPHAGOGASTRODUODENOSCOPY (EGD) WITH PROPOFOL N/A  06/07/2017   Procedure: ESOPHAGOGASTRODUODENOSCOPY (EGD) WITH PROPOFOL;  Surgeon: [name], MD;  Location: [facility] ENDOSCOPY;  Service: Endoscopy;  Laterality: N/A;   IR CATHETER TUBE CHANGE  05/10/2020   REDUCTION MAMMAPLASTY Bilateral 1994    There were no vitals filed for this visit.   Subjective Assessment - 12/04/20 1148     Subjective Patient denies notable issues at arrival to PT. She reports no major soreness following last visit. She is compliant with home exercises given by PT and OT at home. She also uses electric stimulation at home for L forearm/wrist/hand.    Patient is accompained by: Family member   husband   Pertinent History Patient is a 80 year old female s/p incomplete SCI. She had incomplete SCI following fall - C4 [place] D (DOI: 08/26/19). Primary activity limitations c difficulty walking, transferring, and completing independent functional mobility following incomplete SCI. She has undergone home health therapy for about one year. She and her husband reports she was able to transfer and ambulate with walker in home with PT walking beside her. Pt has practiced with front-wheel walker at home. 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 Patient reports using steady transfer aid for performing transfers chair to bed and W/C to standard chair. Her husband states that she is able to stand with steady device and can stand with her legs against the bed. Pt has ramp to get in her home for her power wheelchair. Patient lives in one level home. Pt  uses tub bench that she can utilize to slide into shower; her daughter/husband assists with bathing. Pt has Lucianne Lei that can accommodate her power chair at this time. Pt has concrete driveway to get up to her ramp. Pt has small threshold to get into her home - husband utilizes cardboard to get over threshold.    Limitations Walking;Standing;House hold activities;Other (comment)   transferring, bed mobility, bladder management   Patient  Stated Goals to be able to walk more independently               Gait: Forward flexed posture onto FWW with decreased gait velocity, sound toe-off with good toe clearance (toe clearance decreased following fatigue), decreased step cadence     TREATMENT     Neuromuscular Re-education - for increased motor recruitment of major muscle groups with motor impairment, nervous system priming and exercises to promote trunk stability    Lower limb pawing in unsupported sitting position, edge of low table, alternating LE; x 20 each LE  Lower limb combined hip/knee flexion and dorsiflexion with pawing with VMS for improved dorsiflexor activation. 200 pps phase duration, ramp 1 sec, cycle time 5/5, 28 mA intensity; x 5 minutes each lower extremity    In // bars:  Pedal feet, with unilateral upper limb support; x10 with each arm holding single parallel bar Standing march, with alternating unilateral upper limb support; x20 Bilateral upper extremity reaching and grasping in unsupported standing position with cone stacking; x 5 min (used tray table adjacent to parallel bars)      Therapeutic Activities - patient education, repetitive task practice for improved performance of daily functional activities e.g. transferring    Stand pivot transfer to/from low mat with front-wheeled walker with MaxA for completion of sit to stand, CGA for pivot, and CGA for stand to sit. Minimal cueing for setup of power wheelchair prior to performance of transfer    Gait with FWW in clinic x 150 feet with 2 laps around gym; CGA throughout gait   Seated march on edge of bed for improved lower limb management as needed for car and bed transfers; 20x alternating   *not today* Bed mobility task practice: performed bilateral rolling with setup for upper and lower limbs as needed to promote independent bed mobility. Demo with instructions and dependent setup for limb placement with performance in each direction with  moderate verval cueing for limb placement.    ASSESSMENT Patient demonstrates improved ability to complete cylindrical grip during today's session versus difficulty with sufficient digit extension to grip cones during last session. She does require at least unilateral upper limb support on parallel bars for static standing postural stability during reaching task. She demonstrates good toe clearance following nervous system priming with use of VMS; however, she does exhibit poor toe clearance following fatigue during gait in gym. Patient has remaining deficits in increased tone of wrist/digit flexors (L>RUE), impaired trunk control, decreased LE strength and motor recruitment, gait deficits/deviations, decreased L>R shoulder AROM, and impaired postural stability in standing. Patient will benefit from continued skilled therapeutic intervention to address the above deficits as needed for improved function and QoL.         PT Short Term Goals - 11/27/20 1346       PT SHORT TERM GOAL #1   Title Patient will perform stand-pivot transfer from chair to bed with supervision level of assist and ModI setup of power chair as needed for transferring at home    Baseline 11/26/20: Max assist  for setup and mod assist to perform sit to stand, unsteady pivot prior to performing stand to sit    Time 4    Period Weeks    Status New    Target Date 12/24/20      PT SHORT TERM GOAL #2   Title Patient will perform standing toe tap to 3-inch surface (Airex pad) with bilateral upper extremity support indicative of improved ability to clear feet from floor during stepping and improved weight shift to each LE with AD use    Baseline 11/26/20: Limited toe clearance during bilateral LE swing phase with front-wheeled walker    Time 3    Period Weeks    Status New    Target Date 12/17/20               PT Long Term Goals - 11/26/20 1752       PT LONG TERM GOAL #1   Title Patient will demonstrate improved function  as evidenced by a score of 35 on FOTO measure for full participation in activities at home and in the community.    Baseline 11/26/20: FOTO 12    Time 16    Period Weeks    Status New    Target Date 03/18/21      PT LONG TERM GOAL #2   Title Patient will ambulate for 150 feet without LOB, with sufficient toe clearance to prevent loss of gait stability, and c proper AD placement with least-restrictive assistive device with supervision level of assist    Baseline 11/26/20: Gait x 40 feet with CGA    Time 12    Period Weeks    Target Date 02/19/21      PT LONG TERM GOAL #3   Title Patient will perform independent re-positioning in chair with use of closed-chain triceps on armrests as needed for independent wheelchair mobility with no verbal cueing or tactile cueing required    Baseline 11/26/20: Dependent re-positioning in wheelchair    Time 8    Period Weeks    Status New    Target Date 01/22/21      PT LONG TERM GOAL #4   Title Patient will perform simulated step-over task for clearing doorway threshold with use of front-wheeled walker as needed for entering/exiting home with supervision level of assistance and no LOB    Baseline 11/26/20: Pt currently uses power wheelchair to negotiate threshold into home    Time 16    Period Weeks    Status New    Target Date 03/18/21                   Plan - 12/04/20 1319     Clinical Impression Statement Patient demonstrates improved ability to complete cylindrical grip during today's session versus difficulty with sufficient digit extension to grip cones during last session. She does require at least unilateral upper limb support on parallel bars for static standing postural stability during reaching task. She demonstrates good toe clearance following nervous system priming with use of VMS; however, she does exhibit poor toe clearance following fatigue during gait in gym. Patient has remaining deficits in increased tone of wrist/digit  flexors (L>RUE), impaired trunk control, decreased LE strength and motor recruitment, gait deficits/deviations, decreased L>R shoulder AROM, and impaired postural stability in standing. Patient will benefit from continued skilled therapeutic intervention to address the above deficits as needed for improved function and QoL.    Personal Factors and Comorbidities Age;Comorbidity 3+;Time since onset of injury/illness/exacerbation  Comorbidities GERD, pre-diabetes, obesity, gout, s/p C3-7 ACDF    Examination-Activity Limitations Bathing;Continence;Reach Overhead;Stairs;Bed Mobility;Dressing;Self Feeding;Stand;Hygiene/Grooming;Toileting;Transfers;Locomotion Level    Examination-Participation Restrictions Community Activity;Yard Work;Laundry;Cleaning;Shop    Stability/Clinical Decision Making Evolving/Moderate complexity    Rehab Potential Fair    PT Frequency Other (comment)   2-3x/week   PT Duration Other (comment)   16 weeks   PT Treatment/Interventions Electrical Stimulation;Gait training;Stair training;Functional mobility training;Therapeutic activities;Therapeutic exercise;Balance training;Neuromuscular re-education;Wheelchair mobility training;Patient/family education    PT Next Visit Plan Gait re-training, task practice for transferring, LE strengthening, UE AROM and strengthening to assist with functional mobility and transfers    PT Home Exercise Plan None established at initial evaluaiton, will update and supplement existing home health exercises with successive PT visits    Consulted and Agree with Plan of Care Patient;Family member/caregiver    Family Member Consulted Husband             Patient will benefit from skilled therapeutic intervention in order to improve the following deficits and impairments:  Abnormal gait, Decreased balance, Decreased mobility, Impaired sensation, Decreased strength  Visit Diagnosis: Difficulty in walking, not elsewhere classified  Muscle weakness  (generalized)  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, initial encounter [facility])     Problem List Patient Active Problem List   Diagnosis Date Noted   Suspected deep tissue injury 10/25/2020   Wheelchair dependence 11/27/2019   Spasticity 11/27/2019   Hyperkalemia    Steroid-induced hyperglycemia    Prediabetes    Neurogenic bowel    Neurogenic bladder    Neuropathic pain    Quadriplegia ([place]) 08/31/2019   S/P cervical spinal fusion 08/28/2019   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 [place], [place], [address] Phone: [phone]   Fax:  [phone]  Name: Meredith Roach MRN: [id number] Date of Birth: April 19, 1940

## 2020-12-05 ENCOUNTER — Ambulatory Visit: Payer: Medicare PPO | Admitting: Physical Therapy

## 2020-12-05 DIAGNOSIS — M6281 Muscle weakness (generalized): Secondary | ICD-10-CM

## 2020-12-05 DIAGNOSIS — R262 Difficulty in walking, not elsewhere classified: Secondary | ICD-10-CM

## 2020-12-05 DIAGNOSIS — S14123A 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, initial encounter: Secondary | ICD-10-CM

## 2020-12-07 ENCOUNTER — Encounter: Payer: Self-pay | Admitting: Physical Therapy

## 2020-12-07 NOTE — Therapy (Signed)
[facility] [address] [place], [place], [address] Phone: [phone]   Fax:  [phone]  Physical Therapy Treatment  Patient Details  Name: GRADIE DODY MRN: [id number] Date of Birth: September 13, 1940 Referring Provider (PT): [name], MD   Encounter Date: 12/05/2020   PT End of Session - 12/07/20 0750     Visit Number 4    Number of Visits 4    Date for PT Re-Evaluation 03/18/21    Authorization - Visit Number 2    Progress Note Due on Visit 10    PT Start Time 1347    PT Stop Time 1430    PT Time Calculation (min) 43 min    Equipment Utilized During Treatment Gait belt   Pt uses power wheelchair, clinic's front-wheeled walker utilized for gait   Activity Tolerance Patient tolerated treatment well    Behavior During Therapy [facility] for tasks assessed/performed             Past Medical History:  Diagnosis Date   Arthritis    GERD (gastroesophageal reflux disease)    Gout    Neuromuscular disorder ([employer])    Pre-diabetes     Past Surgical History:  Procedure Laterality Date   ANTERIOR CERVICAL DECOMPRESSION/DISCECTOMY FUSION 4 LEVELS N/A 08/28/2019   Procedure: ANTERIOR CERVICAL DECOMPRESSION/DISCECTOMY FUSION CERVICALTHREE-FOUR CERVICAL,FOUR-FIVE,CERVICAL FIVE-SIX,CERVICAL SIX-SEVEN.;  Surgeon: [name], MD;  Location: [facility];  Service: Neurosurgery;  Laterality: N/A;  ANTERIOR   BACK SURGERY     CATARACT EXTRACTION W/PHACO Right 03/31/2018   Procedure: CATARACT EXTRACTION PHACO AND INTRAOCULAR LENS PLACEMENT ([employer]);  Surgeon: [name], MD;  Location: [facility];  Service: Ophthalmology;  Laterality: Right;  [place] 00:39.7 CDE 4.35 Fluid Pack Lot # [id number]   CHOLECYSTECTOMY  1995   COLONOSCOPY WITH PROPOFOL N/A 06/07/2017   Procedure: COLONOSCOPY WITH PROPOFOL;  Surgeon: [name], MD;  Location: [facility] ENDOSCOPY;  Service: Endoscopy;  Laterality: N/A;   ESOPHAGOGASTRODUODENOSCOPY (EGD) WITH PROPOFOL N/A  06/07/2017   Procedure: ESOPHAGOGASTRODUODENOSCOPY (EGD) WITH PROPOFOL;  Surgeon: [name], MD;  Location: [facility] ENDOSCOPY;  Service: Endoscopy;  Laterality: N/A;   IR CATHETER TUBE CHANGE  05/10/2020   REDUCTION MAMMAPLASTY Bilateral 1994    There were no vitals filed for this visit.   Subjective Assessment - 12/07/20 0748     Subjective Patient reports tolerating yesterday's session well and does not have significant complaints today. Pt is compliant with established HEP including exercises givenby home health and OT. She has utilized her own TENS unit with assistance from her husband for increased sensory feedback to bilateral tibialis anterior.    Patient is accompained by: Family member   husband   Pertinent History Patient is a 80 year old female s/p incomplete SCI. She had incomplete SCI following fall - C4 [place] D (DOI: 08/26/19). Primary activity limitations c difficulty walking, transferring, and completing independent functional mobility following incomplete SCI. She has undergone home health therapy for about one year. She and her husband reports she was able to transfer and ambulate with walker in home with PT walking beside her. Pt has practiced with front-wheel walker at home. 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 Patient reports using steady transfer aid for performing transfers chair to bed and W/C to standard chair. Her husband states that she is able to stand with steady device and can stand with her legs against the bed. Pt has ramp to get in her home for her power wheelchair.  Patient lives in one level home. Pt uses tub bench that she can utilize to slide into shower; her daughter/husband assists with bathing. Pt has Lucianne Lei that can accommodate her power chair at this time. Pt has concrete driveway to get up to her ramp. Pt has small threshold to get into her home - husband utilizes cardboard to get over threshold.    Limitations Walking;Standing;House hold  activities;Other (comment)   transferring, bed mobility, bladder management   Patient Stated Goals to be able to walk more independently               Gait: Forward flexed posture onto FWW with decreased gait velocity, decreased L>R toe clearance decreased step cadence      TREATMENT     Neuromuscular Re-education - for increased motor recruitment of major muscle groups with motor impairment, nervous system priming and exercises to promote trunk stability    Lower limb pawing in unsupported sitting position, edge of low table, alternating LE; x 20 each LE   Seated toe tapping onto Airex pad to promote active dorsiflexion during LE swing; x20 bilateral LE  Seated pelvic tilt on Dynadisc; anterior/posterior x20, L/R x20     In // bars:  Standing march, with alternating unilateral upper limb support; x20 Bilateral upper extremity reaching and grasping in unsupported standing position with cone stacking; x 4 min (used tray table adjacent to parallel bars)    *not today* Lower limb combined hip/knee flexion and dorsiflexion with pawing with VMS for improved dorsiflexor activation. 200 pps phase duration, ramp 1 sec, cycle time 5/5, 28 mA intensity; x 5 minutes each lower extremity Pedal feet, with unilateral upper limb support; x10 with each arm holding single parallel bar     Therapeutic Activities - patient education, repetitive task practice for improved performance of daily functional activities e.g. transferring    Stand pivot transfer to/from low mat with front-wheeled walker with MaxA for completion of sit to stand, CGA for pivot, and CGA for stand to sit. IND setup of power wheelchair prior to performance of transfer    Gait with FWW in clinic x 150 feet with 2 laps around gym; CGA throughout gait   Seated march on edge of bed for improved lower limb management as needed for car and bed transfers; 20x alternating   Bed mobility task practice: performed bilateral  rolling with setup for upper and lower limbs as needed to promote independent bed mobility. MaxA for L arm cross-body adduction to facilitate R rolling.       ASSESSMENT Patient demonstrates lesser toe clearance on L side compared to yesterday's session during gait in clinic. Pt continues to ambulate home-mobility distance with front-wheeled walker without LOB, but dec toe clearance is concerning for potential risk of falling in home. Will need to consider use of NMES to prime nervous system with future sessions given improved toe clearance demonstrated with this yesterday. She is improving in ability to perform stand pivot transfer with walker. She exhibits improving ability to perform grasp/conforming grip to cones during performance of reaching and grasping task. Patient has remaining deficits in increased tone of wrist/digit flexors (L>RUE), impaired trunk control, decreased LE strength and motor recruitment, gait deficits/deviations, decreased L>R shoulder AROM, and impaired postural stability in standing. Patient will benefit from continued skilled therapeutic intervention to address the above deficits as needed for improved function and QoL.          PT Short Term Goals - 11/27/20 1346  PT SHORT TERM GOAL #1   Title Patient will perform stand-pivot transfer from chair to bed with supervision level of assist and ModI setup of power chair as needed for transferring at home    Baseline 11/26/20: Max assist for setup and mod assist to perform sit to stand, unsteady pivot prior to performing stand to sit    Time 4    Period Weeks    Status New    Target Date 12/24/20      PT SHORT TERM GOAL #2   Title Patient will perform standing toe tap to 3-inch surface (Airex pad) with bilateral upper extremity support indicative of improved ability to clear feet from floor during stepping and improved weight shift to each LE with AD use    Baseline 11/26/20: Limited toe clearance during bilateral LE  swing phase with front-wheeled walker    Time 3    Period Weeks    Status New    Target Date 12/17/20               PT Long Term Goals - 11/26/20 1752       PT LONG TERM GOAL #1   Title Patient will demonstrate improved function as evidenced by a score of 35 on FOTO measure for full participation in activities at home and in the community.    Baseline 11/26/20: FOTO 12    Time 16    Period Weeks    Status New    Target Date 03/18/21      PT LONG TERM GOAL #2   Title Patient will ambulate for 150 feet without LOB, with sufficient toe clearance to prevent loss of gait stability, and c proper AD placement with least-restrictive assistive device with supervision level of assist    Baseline 11/26/20: Gait x 40 feet with CGA    Time 12    Period Weeks    Target Date 02/19/21      PT LONG TERM GOAL #3   Title Patient will perform independent re-positioning in chair with use of closed-chain triceps on armrests as needed for independent wheelchair mobility with no verbal cueing or tactile cueing required    Baseline 11/26/20: Dependent re-positioning in wheelchair    Time 8    Period Weeks    Status New    Target Date 01/22/21      PT LONG TERM GOAL #4   Title Patient will perform simulated step-over task for clearing doorway threshold with use of front-wheeled walker as needed for entering/exiting home with supervision level of assistance and no LOB    Baseline 11/26/20: Pt currently uses power wheelchair to negotiate threshold into home    Time 16    Period Weeks    Status New    Target Date 03/18/21                   Plan - 12/07/20 0800     Clinical Impression Statement Patient demonstrates lesser toe clearance on L side compared to yesterday's session during gait in clinic. Pt continues to ambulate home-mobility distance with front-wheeled walker without LOB, but dec toe clearance is concerning for potential risk of falling in home. Will need to consider use of  NMES to prime nervous system with future sessions given improved toe clearance demonstrated with this yesterday. She is improving in ability to perform stand pivot transfer with walker. She exhibits improving ability to perform grasp/conforming grip to cones during performance of reaching and grasping task. Patient has  remaining deficits in increased tone of wrist/digit flexors (L>RUE), impaired trunk control, decreased LE strength and motor recruitment, gait deficits/deviations, decreased L>R shoulder AROM, and impaired postural stability in standing. Patient will benefit from continued skilled therapeutic intervention to address the above deficits as needed for improved function and QoL.    Personal Factors and Comorbidities Age;Comorbidity 3+;Time since onset of injury/illness/exacerbation    Comorbidities GERD, pre-diabetes, obesity, gout, s/p C3-7 ACDF    Examination-Activity Limitations Bathing;Continence;Reach Overhead;Stairs;Bed Mobility;Dressing;Self Feeding;Stand;Hygiene/Grooming;Toileting;Transfers;Locomotion Level    Examination-Participation Restrictions Community Activity;Yard Work;Laundry;Cleaning;Shop    Stability/Clinical Decision Making Evolving/Moderate complexity    Rehab Potential Fair    PT Frequency Other (comment)   2-3x/week   PT Duration Other (comment)   16 weeks   PT Treatment/Interventions Electrical Stimulation;Gait training;Stair training;Functional mobility training;Therapeutic activities;Therapeutic exercise;Balance training;Neuromuscular re-education;Wheelchair mobility training;Patient/family education    PT Next Visit Plan Gait re-training, task practice for transferring, LE strengthening, UE AROM and strengthening to assist with functional mobility and transfers    PT Home Exercise Plan None established at initial evaluaiton, will update and supplement existing home health exercises with successive PT visits    Consulted and Agree with Plan of Care Patient;Family  member/caregiver    Family Member Consulted Husband             Patient will benefit from skilled therapeutic intervention in order to improve the following deficits and impairments:  Abnormal gait, Decreased balance, Decreased mobility, Impaired sensation, Decreased strength  Visit Diagnosis: Difficulty in walking, not elsewhere classified  Muscle weakness (generalized)  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, initial encounter [facility])     Problem List Patient Active Problem List   Diagnosis Date Noted   Suspected deep tissue injury 10/25/2020   Wheelchair dependence 11/27/2019   Spasticity 11/27/2019   Hyperkalemia    Steroid-induced hyperglycemia    Prediabetes    Neurogenic bowel    Neurogenic bladder    Neuropathic pain    Quadriplegia ([place]) 08/31/2019   S/P cervical spinal fusion 08/28/2019   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 [place], [place], [address] Phone: [phone]   Fax:  [phone]  Name: DORINDA AYOUB MRN: [id number] Date of Birth: [birth year]

## 2020-12-09 ENCOUNTER — Other Ambulatory Visit: Payer: Self-pay

## 2020-12-09 ENCOUNTER — Encounter: Payer: Self-pay | Admitting: Physical Therapy

## 2020-12-09 ENCOUNTER — Encounter: Payer: Medicare PPO | Admitting: Physical Therapy

## 2020-12-09 ENCOUNTER — Ambulatory Visit: Payer: Medicare PPO | Admitting: Physical Therapy

## 2020-12-09 DIAGNOSIS — M6281 Muscle weakness (generalized): Secondary | ICD-10-CM

## 2020-12-09 DIAGNOSIS — R262 Difficulty in walking, not elsewhere classified: Secondary | ICD-10-CM

## 2020-12-09 DIAGNOSIS — S14123A 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, initial encounter: Secondary | ICD-10-CM

## 2020-12-09 NOTE — Therapy (Signed)
[facility] [address] [place], [place], [address] Phone: [phone]   Fax:  [phone]  Physical Therapy Treatment  Patient Details  Name: Meredith Roach MRN: [id number] Date of Birth: Jul 30, 1940 Referring Provider (PT): [name], MD   Encounter Date: 12/09/2020   PT End of Session - 12/09/20 1327     Visit Number 5    Number of Visits 59    Date for PT Re-Evaluation 03/18/21    Authorization - Visit Number 2    Progress Note Due on Visit 10    PT Start Time 1147    PT Stop Time 1238    PT Time Calculation (min) 51 min    Equipment Utilized During Treatment Gait belt   Pt uses power wheelchair, clinic's front-wheeled walker utilized for gait   Activity Tolerance Patient tolerated treatment well    Behavior During Therapy WFL for tasks assessed/performed             Past Medical History:  Diagnosis Date   Arthritis    GERD (gastroesophageal reflux disease)    Gout    Neuromuscular disorder ([place])    Pre-diabetes     Past Surgical History:  Procedure Laterality Date   ANTERIOR CERVICAL DECOMPRESSION/DISCECTOMY FUSION 4 LEVELS N/A 08/28/2019   Procedure: ANTERIOR CERVICAL DECOMPRESSION/DISCECTOMY FUSION CERVICALTHREE-FOUR CERVICAL,FOUR-FIVE,CERVICAL FIVE-SIX,CERVICAL SIX-SEVEN.;  Surgeon: [name], MD;  Location: [facility];  Service: Neurosurgery;  Laterality: N/A;  ANTERIOR   BACK SURGERY     CATARACT EXTRACTION W/PHACO Right 03/31/2018   Procedure: CATARACT EXTRACTION PHACO AND INTRAOCULAR LENS PLACEMENT ([place]);  Surgeon: [name], MD;  Location: [facility];  Service: Ophthalmology;  Laterality: Right;  [place] 00:39.7 CDE 4.35 Fluid Pack Lot # [id number]   CHOLECYSTECTOMY  1995   COLONOSCOPY WITH PROPOFOL N/A 06/07/2017   Procedure: COLONOSCOPY WITH PROPOFOL;  Surgeon: [name], MD;  Location: [facility] ENDOSCOPY;  Service: Endoscopy;  Laterality: N/A;   ESOPHAGOGASTRODUODENOSCOPY (EGD) WITH PROPOFOL N/A  06/07/2017   Procedure: ESOPHAGOGASTRODUODENOSCOPY (EGD) WITH PROPOFOL;  Surgeon: [name], MD;  Location: [facility] ENDOSCOPY;  Service: Endoscopy;  Laterality: N/A;   IR CATHETER TUBE CHANGE  05/10/2020   REDUCTION MAMMAPLASTY Bilateral 1994    There were no vitals filed for this visit.   Subjective Assessment - 12/09/20 1325     Subjective Patient reports doing well after her last visit. She is continuing work on aggressive robust HEP at home including standing exercise, seated exercise, bed mobility work, and upper limb AROM. Patient reports ongoing weakness in L>R lower limb. She reports being successful with stand pivot transfer at home ModI.    Patient is accompained by: Family member   husband   Pertinent History Patient is a 80 year old female s/p incomplete SCI. She had incomplete SCI following fall - C4 [place] D (DOI: 08/26/19). Primary activity limitations c difficulty walking, transferring, and completing independent functional mobility following incomplete SCI. She has undergone home health therapy for about one year. She and her husband reports she was able to transfer and ambulate with walker in home with PT walking beside her. Pt has practiced with front-wheel walker at home. 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 Patient reports using steady transfer aid for performing transfers chair to bed and W/C to standard chair. Her husband states that she is able to stand with steady device and can stand with her legs against the bed. Pt has ramp to get in her home for  her power wheelchair. Patient lives in one level home. Pt uses tub bench that she can utilize to slide into shower; her daughter/husband assists with bathing. Pt has Lucianne Lei that can accommodate her power chair at this time. Pt has concrete driveway to get up to her ramp. Pt has small threshold to get into her home - husband utilizes cardboard to get over threshold.    Limitations Walking;Standing;House hold  activities;Other (comment)   transferring, bed mobility, bladder management   Patient Stated Goals to be able to walk more independently              Gait: Mild forward flexed posture onto FWW with decreased gait velocity, decreased L>R toe clearance decreased step cadence, decreased heel strike L>R     TREATMENT     Neuromuscular Re-education - for increased motor recruitment of major muscle groups with motor impairment, nervous system priming and exercises to promote trunk stability     Lower limb combined hip/knee flexion and dorsiflexion with pawing with VMS for improved dorsiflexor activation. 200 pps phase duration, ramp 1 sec, cycle time 5/5, 31 mA intensity; x 5 minutes each lower extremity     Seated pelvic tilt on Dynadisc; anterior/posterior x20, L/R x20  Seated pedal feet on Dynadisc; x25 alternating     Standing in walker with low mat behind patient:  Bilateral upper extremity reaching and grasping in unsupported standing position with cone stacking; x 3 min (used tray table adjacent to parallel bars)     *not today* Standing march, with alternating unilateral upper limb support; x20 Seated toe tapping onto Airex pad to promote active dorsiflexion during LE swing; x20 bilateral LE Lower limb pawing in unsupported sitting position, edge of low table, alternating LE; x 20 each LE Pedal feet, with unilateral upper limb support; x10 with each arm holding single parallel bar     Therapeutic Activities - patient education, repetitive task practice for improved performance of daily functional activities e.g. transferring    Stand pivot transfer to/from low mat with front-wheeled walker with MaxA for completion of sit to stand, CGA for pivot, and CGA for stand to sit. IND setup of power wheelchair prior to performance of transfer    Gait with FWW in clinic x 150 feet with 2 laps around gym; CGA throughout gait   Seated march on edge of bed for improved lower limb  management as needed for car and bed transfers; 20x alternating    *not today* Bed mobility task practice: performed bilateral rolling with setup for upper and lower limbs as needed to promote independent bed mobility. MaxA for L arm cross-body adduction to facilitate R rolling.        ASSESSMENT Patient exhibits ongoing decreased toe clearance during L swing phase. This is modestly improved compared to last visit, but she still has significant motor coordination difficulty with attaining sufficient hip flexion and dorsiflexion during L swing phase. She exhibits improved safety with transferring with consistent reaching for mat/seat during stand to sit and supervision level of assist required for pivot during transfer from power chair to mat. Patient has remaining deficits in increased tone of wrist/digit flexors (L>RUE), impaired trunk control, decreased LE strength and motor recruitment, gait deficits/deviations, decreased L>R shoulder AROM, and impaired postural stability in standing. Patient will benefit from continued skilled therapeutic intervention to address the above deficits as needed for improved function and QoL.               PT Short Term  Goals - 11/27/20 1346       PT SHORT TERM GOAL #1   Title Patient will perform stand-pivot transfer from chair to bed with supervision level of assist and ModI setup of power chair as needed for transferring at home    Baseline 11/26/20: Max assist for setup and mod assist to perform sit to stand, unsteady pivot prior to performing stand to sit    Time 4    Period Weeks    Status New    Target Date 12/24/20      PT SHORT TERM GOAL #2   Title Patient will perform standing toe tap to 3-inch surface (Airex pad) with bilateral upper extremity support indicative of improved ability to clear feet from floor during stepping and improved weight shift to each LE with AD use    Baseline 11/26/20: Limited toe clearance during bilateral LE swing phase  with front-wheeled walker    Time 3    Period Weeks    Status New    Target Date 12/17/20               PT Long Term Goals - 11/26/20 1752       PT LONG TERM GOAL #1   Title Patient will demonstrate improved function as evidenced by a score of 35 on FOTO measure for full participation in activities at home and in the community.    Baseline 11/26/20: FOTO 12    Time 16    Period Weeks    Status New    Target Date 03/18/21      PT LONG TERM GOAL #2   Title Patient will ambulate for 150 feet without LOB, with sufficient toe clearance to prevent loss of gait stability, and c proper AD placement with least-restrictive assistive device with supervision level of assist    Baseline 11/26/20: Gait x 40 feet with CGA    Time 12    Period Weeks    Target Date 02/19/21      PT LONG TERM GOAL #3   Title Patient will perform independent re-positioning in chair with use of closed-chain triceps on armrests as needed for independent wheelchair mobility with no verbal cueing or tactile cueing required    Baseline 11/26/20: Dependent re-positioning in wheelchair    Time 8    Period Weeks    Status New    Target Date 01/22/21      PT LONG TERM GOAL #4   Title Patient will perform simulated step-over task for clearing doorway threshold with use of front-wheeled walker as needed for entering/exiting home with supervision level of assistance and no LOB    Baseline 11/26/20: Pt currently uses power wheelchair to negotiate threshold into home    Time 16    Period Weeks    Status New    Target Date 03/18/21                   Plan - 12/10/20 0740     Clinical Impression Statement Patient exhibits ongoing decreased toe clearance during L swing phase. This is modestly improved compared to last visit, but she still has significant motor coordination difficulty with attaining sufficient hip flexion and dorsiflexion during L swing phase. She exhibits improved safety with transferring with  consistent reaching for mat/seat during stand to sit and supervision level of assist required for pivot during transfer from power chair to mat. Patient has remaining deficits in increased tone of wrist/digit flexors (L>RUE), impaired trunk control, decreased LE  strength and motor recruitment, gait deficits/deviations, decreased L>R shoulder AROM, and impaired postural stability in standing. Patient will benefit from continued skilled therapeutic intervention to address the above deficits as needed for improved function and QoL.    Personal Factors and Comorbidities Age;Comorbidity 3+;Time since onset of injury/illness/exacerbation    Comorbidities GERD, pre-diabetes, obesity, gout, s/p C3-7 ACDF    Examination-Activity Limitations Bathing;Continence;Reach Overhead;Stairs;Bed Mobility;Dressing;Self Feeding;Stand;Hygiene/Grooming;Toileting;Transfers;Locomotion Level    Examination-Participation Restrictions Community Activity;Yard Work;Laundry;Cleaning;Shop    Stability/Clinical Decision Making Evolving/Moderate complexity    Rehab Potential Fair    PT Frequency Other (comment)   2-3x/week   PT Duration Other (comment)   16 weeks   PT Treatment/Interventions Electrical Stimulation;Gait training;Stair training;Functional mobility training;Therapeutic activities;Therapeutic exercise;Balance training;Neuromuscular re-education;Wheelchair mobility training;Patient/family education    PT Next Visit Plan Gait re-training, task practice for transferring, LE strengthening, UE AROM and strengthening to assist with functional mobility and transfers    PT Home Exercise Plan None established at initial evaluaiton, will update and supplement existing home health exercises with successive PT visits    Consulted and Agree with Plan of Care Patient;Family member/caregiver    Family Member Consulted Husband             Patient will benefit from skilled therapeutic intervention in order to improve the following  deficits and impairments:  Abnormal gait, Decreased balance, Decreased mobility, Impaired sensation, Decreased strength  Visit Diagnosis: Difficulty in walking, not elsewhere classified  Muscle weakness (generalized)  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, initial encounter [facility])     Problem List Patient Active Problem List   Diagnosis Date Noted   Suspected deep tissue injury 10/25/2020   Wheelchair dependence 11/27/2019   Spasticity 11/27/2019   Hyperkalemia    Steroid-induced hyperglycemia    Prediabetes    Neurogenic bowel    Neurogenic bladder    Neuropathic pain    Quadriplegia ([place]) 08/31/2019   S/P cervical spinal fusion 08/28/2019   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 [place], [place], [address] Phone: [phone]   Fax:  [phone]  Name: Meredith Roach MRN: [id number] Date of Birth: 29-Oct-1940

## 2020-12-11 ENCOUNTER — Encounter: Payer: Self-pay | Admitting: Physical Therapy

## 2020-12-11 ENCOUNTER — Ambulatory Visit: Payer: Medicare PPO | Admitting: Physical Therapy

## 2020-12-11 ENCOUNTER — Other Ambulatory Visit: Payer: Self-pay

## 2020-12-11 ENCOUNTER — Encounter: Payer: Medicare PPO | Admitting: Physical Therapy

## 2020-12-11 DIAGNOSIS — S14123A 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, initial encounter: Secondary | ICD-10-CM

## 2020-12-11 DIAGNOSIS — M6281 Muscle weakness (generalized): Secondary | ICD-10-CM

## 2020-12-11 DIAGNOSIS — R262 Difficulty in walking, not elsewhere classified: Secondary | ICD-10-CM

## 2020-12-11 NOTE — Therapy (Signed)
[facility] [address] [place], [place], [address] Phone: [phone]   Fax:  [phone]  Physical Therapy Treatment  Patient Details  Name: Meredith Roach MRN: [id number] Date of Birth: Dec 11, 1940 Referring Provider (PT): [name], MD   Encounter Date: 12/11/2020   PT End of Session - 12/11/20 2044     Visit Number 6    Number of Visits 2    Date for PT Re-Evaluation 03/18/21    Progress Note Due on Visit 10    PT Start Time 1140    PT Stop Time 1230    PT Time Calculation (min) 50 min    Equipment Utilized During Treatment Gait belt   Pt uses power wheelchair, clinic's front-wheeled walker utilized for gait   Activity Tolerance Patient tolerated treatment well    Behavior During Therapy [facility] for tasks assessed/performed             Past Medical History:  Diagnosis Date   Arthritis    GERD (gastroesophageal reflux disease)    Gout    Neuromuscular disorder ([place])    Pre-diabetes     Past Surgical History:  Procedure Laterality Date   ANTERIOR CERVICAL DECOMPRESSION/DISCECTOMY FUSION 4 LEVELS N/A 08/28/2019   Procedure: ANTERIOR CERVICAL DECOMPRESSION/DISCECTOMY FUSION CERVICALTHREE-FOUR CERVICAL,FOUR-FIVE,CERVICAL FIVE-SIX,CERVICAL SIX-SEVEN.;  Surgeon: [name], MD;  Location: [facility];  Service: Neurosurgery;  Laterality: N/A;  ANTERIOR   BACK SURGERY     CATARACT EXTRACTION W/PHACO Right 03/31/2018   Procedure: CATARACT EXTRACTION PHACO AND INTRAOCULAR LENS PLACEMENT ([place]);  Surgeon: [name], MD;  Location: [facility];  Service: Ophthalmology;  Laterality: Right;  [place] 00:39.7 CDE 4.35 Fluid Pack Lot # [id number]   CHOLECYSTECTOMY  1995   COLONOSCOPY WITH PROPOFOL N/A 06/07/2017   Procedure: COLONOSCOPY WITH PROPOFOL;  Surgeon: [name], MD;  Location: [facility]OSCOPY;  Service: Endoscopy;  Laterality: N/A;   ESOPHAGOGASTRODUODENOSCOPY (EGD) WITH PROPOFOL N/A 06/07/2017   Procedure:  ESOPHAGOGASTRODUODENOSCOPY (EGD) WITH PROPOFOL;  Surgeon: [name], MD;  Location: [facility];  Service: Endoscopy;  Laterality: N/A;   IR CATHETER TUBE CHANGE  05/10/2020   REDUCTION MAMMAPLASTY Bilateral 1994    There were no vitals filed for this visit.   Subjective Assessment - 12/11/20 2042     Subjective Patient reports tolerating last session well. She reports continued HEP compliance. She has improved spasticity of L hand following Botox injections; she does have difficulty with closing fist on L side presently.    Patient is accompained by: Family member   husband   Pertinent History Patient is a 80 year old female s/p incomplete SCI. She had incomplete SCI following fall - C4 [place] D (DOI: 08/26/19). Primary activity limitations c difficulty walking, transferring, and completing independent functional mobility following incomplete SCI. She has undergone home health therapy for about one year. She and her husband reports she was able to transfer and ambulate with walker in home with PT walking beside her. Pt has practiced with front-wheel walker at home. 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 Patient reports using steady transfer aid for performing transfers chair to bed and W/C to standard chair. Her husband states that she is able to stand with steady device and can stand with her legs against the bed. Pt has ramp to get in her home for her power wheelchair. Patient lives in one level home. Pt uses tub bench that she can utilize to slide into shower; her daughter/husband assists with  bathing. Pt has Lucianne Lei that can accommodate her power chair at this time. Pt has concrete driveway to get up to her ramp. Pt has small threshold to get into her home - husband utilizes cardboard to get over threshold.    Limitations Walking;Standing;House hold activities;Other (comment)   transferring, bed mobility, bladder management   Patient Stated Goals to be able to walk more  independently                 Gait: Mild forward flexed posture onto FWW with decreased gait velocity, decreased L>R toe clearance decreased step cadence, decreased heel strike L>R     TREATMENT     Neuromuscular Re-education - for increased motor recruitment of major muscle groups with motor impairment, nervous system priming and exercises to promote trunk stability      Lower limb combined hip/knee flexion and dorsiflexion with pawing with VMS for improved dorsiflexor activation. 200 pps phase duration, ramp 1 sec, cycle time 5/5, 33 mA intensity; x 5 minutes each lower extremity     Seated pelvic tilt on Dynadisc; anterior/posterior x20, L/R x20   Seated march on Dynadisc; x20 alternating     Standing in walker with W/C behind patient:  Grasping with fine motor pulp to pulp grasp, grabbing bolts and placing into adjacent container, in standing x 3 min     *not today* Standing march, with alternating unilateral upper limb support; x20 Seated toe tapping onto Airex pad to promote active dorsiflexion during LE swing; x20 bilateral LE Lower limb pawing in unsupported sitting position, edge of low table, alternating LE; x 20 each LE Pedal feet, with unilateral upper limb support; x10 with each arm holding single parallel bar Bilateral upper extremity reaching and grasping in unsupported standing position with cone stacking; x 3 min (used tray table adjacent to parallel bars)     Therapeutic Activities - patient education, repetitive task practice for improved performance of daily functional activities e.g. transferring    Stand pivot transfer to/from low mat with front-wheeled walker with MaxA for completion of sit to stand, CGA for pivot, and CGA for stand to sit. IND setup of power wheelchair prior to performance of transfer    Gait with FWW in clinic x 80 feet with 1 laps around gym; CGA/supervision throughout gait   Bed mobility task practice: performed bilateral  rolling with ModA for upper and lower limbs as needed to promote independent bed mobility. MaxA for L arm cross-body adduction to facilitate R rolling.    Bridging; x25, partial range     *not today* Seated march on edge of bed for improved lower limb management as needed for car and bed transfers; 20x alternating        ASSESSMENT Patient has significant difficulty with assuming standing and performing bilateral fine motor task without at least single upper limb support on walker. Patient does exhibit independence with gait in gym with FWW with no LOB, but she does have mild toe drag on L side (concerning for possible LOB in home environment). She is able to perform grasp of small item with use of pulp to pulp grip with L hand. Patient has remaining deficits in increased tone of wrist/digit flexors (L>RUE), impaired trunk control, decreased LE strength and motor recruitment, gait deficits/deviations, decreased L>R shoulder AROM, and impaired postural stability in standing. Patient will benefit from continued skilled therapeutic intervention to address the above deficits as needed for improved function and QoL.  PT Short Term Goals - 11/27/20 1346       PT SHORT TERM GOAL #1   Title Patient will perform stand-pivot transfer from chair to bed with supervision level of assist and ModI setup of power chair as needed for transferring at home    Baseline 11/26/20: Max assist for setup and mod assist to perform sit to stand, unsteady pivot prior to performing stand to sit    Time 4    Period Weeks    Status New    Target Date 12/24/20      PT SHORT TERM GOAL #2   Title Patient will perform standing toe tap to 3-inch surface (Airex pad) with bilateral upper extremity support indicative of improved ability to clear feet from floor during stepping and improved weight shift to each LE with AD use    Baseline 11/26/20: Limited toe clearance during bilateral LE swing phase with  front-wheeled walker    Time 3    Period Weeks    Status New    Target Date 12/17/20               PT Long Term Goals - 11/26/20 1752       PT LONG TERM GOAL #1   Title Patient will demonstrate improved function as evidenced by a score of 35 on FOTO measure for full participation in activities at home and in the community.    Baseline 11/26/20: FOTO 12    Time 16    Period Weeks    Status New    Target Date 03/18/21      PT LONG TERM GOAL #2   Title Patient will ambulate for 150 feet without LOB, with sufficient toe clearance to prevent loss of gait stability, and c proper AD placement with least-restrictive assistive device with supervision level of assist    Baseline 11/26/20: Gait x 40 feet with CGA    Time 12    Period Weeks    Target Date 02/19/21      PT LONG TERM GOAL #3   Title Patient will perform independent re-positioning in chair with use of closed-chain triceps on armrests as needed for independent wheelchair mobility with no verbal cueing or tactile cueing required    Baseline 11/26/20: Dependent re-positioning in wheelchair    Time 8    Period Weeks    Status New    Target Date 01/22/21      PT LONG TERM GOAL #4   Title Patient will perform simulated step-over task for clearing doorway threshold with use of front-wheeled walker as needed for entering/exiting home with supervision level of assistance and no LOB    Baseline 11/26/20: Pt currently uses power wheelchair to negotiate threshold into home    Time 16    Period Weeks    Status New    Target Date 03/18/21                   Plan - 12/11/20 2056     Clinical Impression Statement Patient has significant difficulty with assuming standing and performing bilateral fine motor task without at least single upper limb support on walker. Patient does exhibit independence with gait in gym with FWW with no LOB, but she does have mild toe drag on L side (concerning for possible LOB in home environment).  She is able to perform grasp of small item with use of pulp to pulp grip with L hand. Patient has remaining deficits in increased tone of wrist/digit  flexors (L>RUE), impaired trunk control, decreased LE strength and motor recruitment, gait deficits/deviations, decreased L>R shoulder AROM, and impaired postural stability in standing. Patient will benefit from continued skilled therapeutic intervention to address the above deficits as needed for improved function and QoL.    Personal Factors and Comorbidities Age;Comorbidity 3+;Time since onset of injury/illness/exacerbation    Comorbidities GERD, pre-diabetes, obesity, gout, s/p C3-7 ACDF    Examination-Activity Limitations Bathing;Continence;Reach Overhead;Stairs;Bed Mobility;Dressing;Self Feeding;Stand;Hygiene/Grooming;Toileting;Transfers;Locomotion Level    Examination-Participation Restrictions Community Activity;Yard Work;Laundry;Cleaning;Shop    Stability/Clinical Decision Making Evolving/Moderate complexity    Rehab Potential Fair    PT Frequency Other (comment)   2-3x/week   PT Duration Other (comment)   16 weeks   PT Treatment/Interventions Electrical Stimulation;Gait training;Stair training;Functional mobility training;Therapeutic activities;Therapeutic exercise;Balance training;Neuromuscular re-education;Wheelchair mobility training;Patient/family education    PT Next Visit Plan Gait re-training, task practice for transferring, LE strengthening, UE AROM and strengthening to assist with functional mobility and transfers    PT Home Exercise Plan None established at initial evaluaiton, will update and supplement existing home health exercises with successive PT visits    Consulted and Agree with Plan of Care Patient;Family member/caregiver    Family Member Consulted Husband             Patient will benefit from skilled therapeutic intervention in order to improve the following deficits and impairments:  Abnormal gait, Decreased balance,  Decreased mobility, Impaired sensation, Decreased strength  Visit Diagnosis: Difficulty in walking, not elsewhere classified  Muscle weakness (generalized)  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, initial encounter [facility])     Problem List Patient Active Problem List   Diagnosis Date Noted   Suspected deep tissue injury 10/25/2020   Wheelchair dependence 11/27/2019   Spasticity 11/27/2019   Hyperkalemia    Steroid-induced hyperglycemia    Prediabetes    Neurogenic bowel    Neurogenic bladder    Neuropathic pain    Quadriplegia ([place]) 08/31/2019   S/P cervical spinal fusion 08/28/2019   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 [place], [place], [address] Phone: [phone]   Fax:  [phone]  Name: Meredith Roach MRN: [id number] Date of Birth: [birth year]

## 2020-12-12 ENCOUNTER — Encounter: Payer: Self-pay | Admitting: Physical Therapy

## 2020-12-12 ENCOUNTER — Ambulatory Visit: Payer: Medicare PPO | Attending: Physical Medicine and Rehabilitation | Admitting: Physical Therapy

## 2020-12-12 DIAGNOSIS — S14123A 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, initial encounter: Secondary | ICD-10-CM | POA: Diagnosis present

## 2020-12-12 DIAGNOSIS — R262 Difficulty in walking, not elsewhere classified: Secondary | ICD-10-CM | POA: Diagnosis not present

## 2020-12-12 DIAGNOSIS — M6281 Muscle weakness (generalized): Secondary | ICD-10-CM | POA: Diagnosis present

## 2020-12-12 NOTE — Therapy (Signed)
[facility] [address] [place], [place], [address] Phone: [phone]   Fax:  [phone]  Physical Therapy Treatment  Patient Details  Name: Meredith Roach MRN: [id number] Date of Birth: November 03, 1940 Referring Provider (PT): [name], MD   Encounter Date: 12/12/2020   PT End of Session - 12/12/20 1444     Visit Number 7    Number of Visits 15    Date for PT Re-Evaluation 03/18/21    Progress Note Due on Visit 10    PT Start Time 1347    PT Stop Time 1429    PT Time Calculation (min) 42 min    Equipment Utilized During Treatment Gait belt   Pt uses power wheelchair, clinic's front-wheeled walker utilized for gait   Activity Tolerance Patient tolerated treatment well    Behavior During Therapy [facility] for tasks assessed/performed             Past Medical History:  Diagnosis Date   Arthritis    GERD (gastroesophageal reflux disease)    Gout    Neuromuscular disorder ([place])    Pre-diabetes     Past Surgical History:  Procedure Laterality Date   ANTERIOR CERVICAL DECOMPRESSION/DISCECTOMY FUSION 4 LEVELS N/A 08/28/2019   Procedure: ANTERIOR CERVICAL DECOMPRESSION/DISCECTOMY FUSION CERVICALTHREE-FOUR CERVICAL,FOUR-FIVE,CERVICAL FIVE-SIX,CERVICAL SIX-SEVEN.;  Surgeon: [name], MD;  Location: [place];  Service: Neurosurgery;  Laterality: N/A;  ANTERIOR   BACK SURGERY     CATARACT EXTRACTION W/PHACO Right 03/31/2018   Procedure: CATARACT EXTRACTION PHACO AND INTRAOCULAR LENS PLACEMENT ([place]);  Surgeon: [name], MD;  Location: [facility];  Service: Ophthalmology;  Laterality: Right;  [place] 00:39.7 CDE 4.35 Fluid Pack Lot # [id number]   CHOLECYSTECTOMY  1995   COLONOSCOPY WITH PROPOFOL N/A 06/07/2017   Procedure: COLONOSCOPY WITH PROPOFOL;  Surgeon: [name], MD;  Location: [facility] ENDOSCOPY;  Service: Endoscopy;  Laterality: N/A;   ESOPHAGOGASTRODUODENOSCOPY (EGD) WITH PROPOFOL N/A 06/07/2017   Procedure:  ESOPHAGOGASTRODUODENOSCOPY (EGD) WITH PROPOFOL;  Surgeon: [name], MD;  Location: [facility] ENDOSCOPY;  Service: Endoscopy;  Laterality: N/A;   IR CATHETER TUBE CHANGE  05/10/2020   REDUCTION MAMMAPLASTY Bilateral 1994    There were no vitals filed for this visit.   Subjective Assessment - 12/13/20 0854     Subjective Patient reports feeling well today and reports continued work on aggressive HEP. She has been working on bed mobility drills, pelvic/trunk control work, and LE pawing at home along with established home health program. She reports no major issues following her previous visit.    Patient is accompained by: Family member   husband   Pertinent History Patient is a 80 year old female s/p incomplete SCI. She had incomplete SCI following fall - C4 [place] D (DOI: 08/26/19). Primary activity limitations c difficulty walking, transferring, and completing independent functional mobility following incomplete SCI. She has undergone home health therapy for about one year. She and her husband reports she was able to transfer and ambulate with walker in home with PT walking beside her. Pt has practiced with front-wheel walker at home. 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 Patient reports using steady transfer aid for performing transfers chair to bed and W/C to standard chair. Her husband states that she is able to stand with steady device and can stand with her legs against the bed. Pt has ramp to get in her home for her power wheelchair. Patient lives in one level home. Pt uses tub bench that  she can utilize to slide into shower; her daughter/husband assists with bathing. Pt has Lucianne Lei that can accommodate her power chair at this time. Pt has concrete driveway to get up to her ramp. Pt has small threshold to get into her home - husband utilizes cardboard to get over threshold.    Limitations Walking;Standing;House hold activities;Other (comment)   transferring, bed mobility, bladder  management   Patient Stated Goals to be able to walk more independently               Gait: Mild forward flexed posture onto FWW with decreased gait velocity, decreased L>R toe clearance decreased step cadence that is exacerbated following fatigue, decreased heel strike L>R     TREATMENT     Neuromuscular Re-education - for increased motor recruitment of major muscle groups with motor impairment, nervous system priming and exercises to promote trunk stability      Standing march, with alternating unilateral upper limb support; x20   Seated pelvic tilt on Dynadisc; anterior/posterior x20, L/R x20   Seated march on Dynadisc; x20 alternating     Standing in walker with W/C behind patient:  Grasping with fine motor pulp to pulp grasp, grabbing bolts and placing into adjacent container, in standing x 4 min   Standing bilateral weight shift in static standing with no UE support; x20 alternating    *not today* Lower limb combined hip/knee flexion and dorsiflexion with pawing with VMS for improved dorsiflexor activation. 200 pps phase duration, ramp 1 sec, cycle time 5/5, 33 mA intensity; x 5 minutes each lower extremity Seated toe tapping onto Airex pad to promote active dorsiflexion during LE swing; x20 bilateral LE Lower limb pawing in unsupported sitting position, edge of low table, alternating LE; x 20 each LE Pedal feet, with unilateral upper limb support; x10 with each arm holding single parallel bar Bilateral upper extremity reaching and grasping in unsupported standing position with cone stacking; x 3 min (used tray table adjacent to parallel bars)     Therapeutic Activities - patient education, repetitive task practice for improved performance of daily functional activities e.g. transferring    Stand pivot transfer to/from low mat with front-wheeled walker with MaxA for completion of sit to stand, CGA for pivot, and CGA for stand to sit. IND setup of power wheelchair prior  to performance of transfer    Gait with FWW in clinic x 150 feet with 2 laps around gym; CGA/supervision throughout gait   Bed mobility task practice: performed bilateral rolling with ModA for upper and lower limbs as needed to promote independent bed mobility. MaxA for L arm cross-body adduction to facilitate R rolling. MinA for L rolling.    Bridging; x25, partial range      *not today* Seated march on edge of bed for improved lower limb management as needed for car and bed transfers; 20x alternating         ASSESSMENT Patient exhibits improving ability to perform sit to stand with unilateral (R) upper limb support. She is improving in ability to reposition in her wheelchair independently. She is able to perform grasp of small item with use of pulp to pulp grip with L hand following multiple attempts due to limited L hand prehension (she still has one more Botox treatment scheduled for LUE spasticity). She has ongoing difficulty with L toe clearance (about 25% of steps clearing adequately). Patient has remaining deficits in increased tone of wrist/digit flexors (L>RUE), impaired trunk control, decreased LE strength and motor  recruitment, gait deficits/deviations, decreased L>R shoulder AROM, and impaired postural stability in standing. Patient will benefit from continued skilled therapeutic intervention to address the above deficits as needed for improved function and QoL.            PT Short Term Goals - 11/27/20 1346       PT SHORT TERM GOAL #1   Title Patient will perform stand-pivot transfer from chair to bed with supervision level of assist and ModI setup of power chair as needed for transferring at home    Baseline 11/26/20: Max assist for setup and mod assist to perform sit to stand, unsteady pivot prior to performing stand to sit    Time 4    Period Weeks    Status New    Target Date 12/24/20      PT SHORT TERM GOAL #2   Title Patient will perform standing toe tap to  3-inch surface (Airex pad) with bilateral upper extremity support indicative of improved ability to clear feet from floor during stepping and improved weight shift to each LE with AD use    Baseline 11/26/20: Limited toe clearance during bilateral LE swing phase with front-wheeled walker    Time 3    Period Weeks    Status New    Target Date 12/17/20               PT Long Term Goals - 11/26/20 1752       PT LONG TERM GOAL #1   Title Patient will demonstrate improved function as evidenced by a score of 35 on FOTO measure for full participation in activities at home and in the community.    Baseline 11/26/20: FOTO 12    Time 16    Period Weeks    Status New    Target Date 03/18/21      PT LONG TERM GOAL #2   Title Patient will ambulate for 150 feet without LOB, with sufficient toe clearance to prevent loss of gait stability, and c proper AD placement with least-restrictive assistive device with supervision level of assist    Baseline 11/26/20: Gait x 40 feet with CGA    Time 12    Period Weeks    Target Date 02/19/21      PT LONG TERM GOAL #3   Title Patient will perform independent re-positioning in chair with use of closed-chain triceps on armrests as needed for independent wheelchair mobility with no verbal cueing or tactile cueing required    Baseline 11/26/20: Dependent re-positioning in wheelchair    Time 8    Period Weeks    Status New    Target Date 01/22/21      PT LONG TERM GOAL #4   Title Patient will perform simulated step-over task for clearing doorway threshold with use of front-wheeled walker as needed for entering/exiting home with supervision level of assistance and no LOB    Baseline 11/26/20: Pt currently uses power wheelchair to negotiate threshold into home    Time 16    Period Weeks    Status New    Target Date 03/18/21                   Plan - 12/13/20 0916     Clinical Impression Statement Patient exhibits improving ability to perform sit  to stand with unilateral (R) upper limb support. She is improving in ability to reposition in her wheelchair independently. She is able to perform grasp of small item with  use of pulp to pulp grip with L hand following multiple attempts due to limited L hand prehension (she still has one more Botox treatment scheduled for LUE spasticity). She has ongoing difficulty with L toe clearance (about 25% of steps clearing adequately). Patient has remaining deficits in increased tone of wrist/digit flexors (L>RUE), impaired trunk control, decreased LE strength and motor recruitment, gait deficits/deviations, decreased L>R shoulder AROM, and impaired postural stability in standing. Patient will benefit from continued skilled therapeutic intervention to address the above deficits as needed for improved function and QoL.    Personal Factors and Comorbidities Age;Comorbidity 3+;Time since onset of injury/illness/exacerbation    Comorbidities GERD, pre-diabetes, obesity, gout, s/p C3-7 ACDF    Examination-Activity Limitations Bathing;Continence;Reach Overhead;Stairs;Bed Mobility;Dressing;Self Feeding;Stand;Hygiene/Grooming;Toileting;Transfers;Locomotion Level    Examination-Participation Restrictions Community Activity;Yard Work;Laundry;Cleaning;Shop    Stability/Clinical Decision Making Evolving/Moderate complexity    Rehab Potential Fair    PT Frequency Other (comment)   2-3x/week   PT Duration Other (comment)   16 weeks   PT Treatment/Interventions Electrical Stimulation;Gait training;Stair training;Functional mobility training;Therapeutic activities;Therapeutic exercise;Balance training;Neuromuscular re-education;Wheelchair mobility training;Patient/family education    PT Next Visit Plan Gait re-training, task practice for transferring, LE strengthening, UE AROM and strengthening to assist with functional mobility and transfers    PT Home Exercise Plan None established at initial evaluaiton, will update and  supplement existing home health exercises with successive PT visits    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, initial encounter [facility])     Problem List Patient Active Problem List   Diagnosis Date Noted   Suspected deep tissue injury 10/25/2020   Wheelchair dependence 11/27/2019   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 [place], [place], [address] Phone: [phone]   Fax:  [phone]  Name: Meredith Roach MRN: [id number] Date of Birth: Jan 18, 1941

## 2020-12-18 ENCOUNTER — Ambulatory Visit: Payer: Medicare PPO | Admitting: Physical Therapy

## 2020-12-18 ENCOUNTER — Other Ambulatory Visit: Payer: Self-pay

## 2020-12-18 ENCOUNTER — Encounter: Payer: Medicare PPO | Admitting: Physical Therapy

## 2020-12-18 ENCOUNTER — Encounter: Payer: Self-pay | Admitting: Physical Therapy

## 2020-12-18 DIAGNOSIS — R262 Difficulty in walking, not elsewhere classified: Secondary | ICD-10-CM

## 2020-12-18 DIAGNOSIS — M6281 Muscle weakness (generalized): Secondary | ICD-10-CM

## 2020-12-18 DIAGNOSIS — S14123A 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, initial encounter: Secondary | ICD-10-CM

## 2020-12-18 NOTE — Therapy (Addendum)
[facility] [address] [place], [place], [address] Phone: [phone]   Fax:  [phone]  Physical Therapy Treatment  Patient Details  Name: Meredith Roach MRN: [id number] Date of Birth: [birth year] Referring Provider (PT): [name], MD   Encounter Date: 12/18/2020   PT End of Session - 12/18/20 2112     Visit Number 8    Number of Visits 1    Date for PT Re-Evaluation 03/18/21    Progress Note Due on Visit 10    PT Start Time 1143    PT Stop Time 1235    PT Time Calculation (min) 52 min    Equipment Utilized During Treatment Gait belt   Pt uses power wheelchair, clinic's front-wheeled walker utilized for gait   Activity Tolerance Patient tolerated treatment well    Behavior During Therapy [facility] for tasks assessed/performed             Past Medical History:  Diagnosis Date   Arthritis    GERD (gastroesophageal reflux disease)    Gout    Neuromuscular disorder ([place])    Pre-diabetes     Past Surgical History:  Procedure Laterality Date   ANTERIOR CERVICAL DECOMPRESSION/DISCECTOMY FUSION 4 LEVELS N/A 08/28/2019   Procedure: ANTERIOR CERVICAL DECOMPRESSION/DISCECTOMY FUSION CERVICALTHREE-FOUR CERVICAL,FOUR-FIVE,CERVICAL FIVE-SIX,CERVICAL SIX-SEVEN.;  Surgeon: [name], MD;  Location: [place];  Service: Neurosurgery;  Laterality: N/A;  ANTERIOR   BACK SURGERY     CATARACT EXTRACTION W/PHACO Right 03/31/2018   Procedure: CATARACT EXTRACTION PHACO AND INTRAOCULAR LENS PLACEMENT ([place]);  Surgeon: [name], MD;  Location: [facility];  Service: Ophthalmology;  Laterality: Right;  [place] 00:39.7 CDE 4.35 Fluid Pack Lot # [id number]   CHOLECYSTECTOMY  1995   COLONOSCOPY WITH PROPOFOL N/A 06/07/2017   Procedure: COLONOSCOPY WITH PROPOFOL;  Surgeon: [name], MD;  Location: [facility] ENDOSCOPY;  Service: Endoscopy;  Laterality: N/A;   ESOPHAGOGASTRODUODENOSCOPY (EGD) WITH PROPOFOL N/A 06/07/2017   Procedure:  ESOPHAGOGASTRODUODENOSCOPY (EGD) WITH PROPOFOL;  Surgeon: [name], MD;  Location: [facility] END[facility]COPY;  Service: Endoscopy;  Laterality: N/A;   IR CATHETER TUBE CHANGE  05/10/2020   REDUCTION MAMMAPLASTY Bilateral 1994    There were no vitals filed for this visit.   Subjective Assessment - 12/18/20 1200     Subjective Patient reports doing well over the weekend and completing walking in her home with her front-wheel walker and her daughter assisting. Patient reports no major issues or new complaints following her last visit.    Patient is accompained by: Family member   husband   Pertinent History Patient is a 80 year old female s/p incomplete SCI. She had incomplete SCI following fall - C4 [place] D (DOI: 08/26/19). Primary activity limitations c difficulty walking, transferring, and completing independent functional mobility following incomplete SCI. She has undergone home health therapy for about one year. She and her husband reports she was able to transfer and ambulate with walker in home with PT walking beside her. Pt has practiced with front-wheel walker at home. 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 Patient reports using steady transfer aid for performing transfers chair to bed and W/C to standard chair. Her husband states that she is able to stand with steady device and can stand with her legs against the bed. Pt has ramp to get in her home for her power wheelchair. Patient lives in one level home. Pt uses tub bench that she can utilize to slide into shower; her daughter/husband assists  with bathing. Pt has Lucianne Lei that can accommodate her power chair at this time. Pt has concrete driveway to get up to her ramp. Pt has small threshold to get into her home - husband utilizes cardboard to get over threshold.    Limitations Walking;Standing;House hold activities;Other (comment)   transferring, bed mobility, bladder management   Patient Stated Goals to be able to walk more independently                Gait: Mild forward flexed posture onto FWW with decreased gait velocity, mild decrease in L>R toe clearance, decreased heel strike L>R     TREATMENT     Neuromuscular Re-education - for increased motor recruitment of major muscle groups with motor impairment, nervous system priming and exercises to promote trunk stability    Lower limb combined hip/knee flexion and dorsiflexion with pawing with VMS for improved dorsiflexor activation. 200 pps phase duration, ramp 1 sec, cycle time 5/5, 32 mA intensity; x 5 minutes each lower extremity   Seated pelvic tilt on Dynadisc; anterior/posterior x20, L/R x20  Seated pelvic tilt on Dynadisc; alternating hip hike; x20, L/R x20     In // bars:  Bilateral upper extremity reaching and grasping in unsupported standing position with cone stacking; x 3 min (used tray table adjacent to parallel bars, opposite UE support on bar prn if losing balance)    Standing bilateral weight shift in static standing with no UE support; x25 alternating   Standing toe tap to Airex pad with bilateral UE support, with CGA and chair behind patient; 2x10 alternating  *not today* Seated march on Dynadisc; x20 alternating Grasping with fine motor pulp to pulp grasp, grabbing bolts and placing into adjacent container, in standing x 4 min Standing march, with alternating unilateral upper limb support; x20 Seated toe tapping onto Airex pad to promote active dorsiflexion during LE swing; x20 bilateral LE Lower limb pawing in unsupported sitting position, edge of low table, alternating LE; x 20 each LE Pedal feet, with unilateral upper limb support; x10 with each arm holding single parallel bar       Therapeutic Activities - patient education, repetitive task practice for improved performance of daily functional activities e.g. transferring    Stand pivot transfer to/from low mat with front-wheeled walker with MaxA for completion of sit to stand,  supervision for pivot, and CGA for stand to sit. IND setup of power wheelchair prior to performance of transfer    Gait with FWW in clinic x 150 feet with 2 laps around gym; CGA/supervision throughout gait   Forward/backward trunk lean with CGA posteriorly in sitting on Dynadisc; x 20 forward/backward     *not today* Bed mobility task practice: performed bilateral rolling with ModA for upper and lower limbs as needed to promote independent bed mobility. MaxA for L arm cross-body adduction to facilitate R rolling. MinA for L rolling.  Bridging; x25, partial range  Seated march on edge of bed for improved lower limb management as needed for car and bed transfers; 20x alternating         ASSESSMENT Patient exhibits improved L toe clearance relative to her previous visit. She does have ongoing deficits with LUE grasping and decreased shoulder AROM. She is exhibiting improving ability to perform sit to stand with RUE assist and is safe with transferring from chair to low mat with close supervision. She still has diminished trunk control as exhibited by difficulty with resuming upright position from position of seated trunk extension (  during forward/backward trunk lean on Dynadisc). Patient has remaining deficits in increased tone of wrist/digit flexors (L>RUE), impaired trunk control, decreased LE strength and motor recruitment, gait deficits/deviations, decreased L>R shoulder AROM, and impaired postural stability in standing. Patient will benefit from continued skilled therapeutic intervention to address the above deficits as needed for improved function and QoL.          PT Short Term Goals - 11/27/20 1346       PT SHORT TERM GOAL #1   Title Patient will perform stand-pivot transfer from chair to bed with supervision level of assist and ModI setup of power chair as needed for transferring at home    Baseline 11/26/20: Max assist for setup and mod assist to perform sit to stand, unsteady pivot  prior to performing stand to sit    Time 4    Period Weeks    Status New    Target Date 12/24/20      PT SHORT TERM GOAL #2   Title Patient will perform standing toe tap to 3-inch surface (Airex pad) with bilateral upper extremity support indicative of improved ability to clear feet from floor during stepping and improved weight shift to each LE with AD use    Baseline 11/26/20: Limited toe clearance during bilateral LE swing phase with front-wheeled walker    Time 3    Period Weeks    Status New    Target Date 12/17/20               PT Long Term Goals - 11/26/20 1752       PT LONG TERM GOAL #1   Title Patient will demonstrate improved function as evidenced by a score of 35 on FOTO measure for full participation in activities at home and in the community.    Baseline 11/26/20: FOTO 12    Time 16    Period Weeks    Status New    Target Date 03/18/21      PT LONG TERM GOAL #2   Title Patient will ambulate for 150 feet without LOB, with sufficient toe clearance to prevent loss of gait stability, and c proper AD placement with least-restrictive assistive device with supervision level of assist    Baseline 11/26/20: Gait x 40 feet with CGA    Time 12    Period Weeks    Target Date 02/19/21      PT LONG TERM GOAL #3   Title Patient will perform independent re-positioning in chair with use of closed-chain triceps on armrests as needed for independent wheelchair mobility with no verbal cueing or tactile cueing required    Baseline 11/26/20: Dependent re-positioning in wheelchair    Time 8    Period Weeks    Status New    Target Date 01/22/21      PT LONG TERM GOAL #4   Title Patient will perform simulated step-over task for clearing doorway threshold with use of front-wheeled walker as needed for entering/exiting home with supervision level of assistance and no LOB    Baseline 11/26/20: Pt currently uses power wheelchair to negotiate threshold into home    Time 16    Period  Weeks    Status New    Target Date 03/18/21                   Plan - 12/19/20 0720     Clinical Impression Statement Patient exhibits improved L toe clearance relative to her previous visit. She does  have ongoing deficits with LUE grasping and decreased shoulder AROM. She is exhibiting improving ability to perform sit to stand with RUE assist and is safe with transferring from chair to low mat with close supervision. She still has diminished trunk control as exhibited by difficulty with resuming upright position from position of seated trunk extension (during forward/backward trunk lean on Dynadisc). Patient has remaining deficits in increased tone of wrist/digit flexors (L>RUE), impaired trunk control, decreased LE strength and motor recruitment, gait deficits/deviations, decreased L>R shoulder AROM, and impaired postural stability in standing. Patient will benefit from continued skilled therapeutic intervention to address the above deficits as needed for improved function and QoL.    Personal Factors and Comorbidities Age;Comorbidity 3+;Time since onset of injury/illness/exacerbation    Comorbidities GERD, pre-diabetes, obesity, gout, s/p C3-7 ACDF    Examination-Activity Limitations Bathing;Continence;Reach Overhead;Stairs;Bed Mobility;Dressing;Self Feeding;Stand;Hygiene/Grooming;Toileting;Transfers;Locomotion Level    Examination-Participation Restrictions Community Activity;Yard Work;Laundry;Cleaning;Shop    Stability/Clinical Decision Making Evolving/Moderate complexity    Rehab Potential Fair    PT Frequency Other (comment)   2-3x/week   PT Duration Other (comment)   16 weeks   PT Treatment/Interventions Electrical Stimulation;Gait training;Stair training;Functional mobility training;Therapeutic activities;Therapeutic exercise;Balance training;Neuromuscular re-education;Wheelchair mobility training;Patient/family education    PT Next Visit Plan Gait re-training, task practice for  transferring, LE strengthening, UE AROM and strengthening to assist with functional mobility and transfers    PT Home Exercise Plan None established at initial evaluaiton, will update and supplement existing home health exercises with successive PT visits    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, initial encounter [facility])     Problem List Patient Active Problem List   Diagnosis Date Noted   Suspected deep tissue injury 10/25/2020   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 [place], [place], [address] Phone: [phone]   Fax:  [phone]  Name: Meredith Roach MRN: [id number] Date of Birth: 15-Jun-1940

## 2020-12-19 ENCOUNTER — Ambulatory Visit: Payer: Medicare PPO | Admitting: Physical Therapy

## 2020-12-19 DIAGNOSIS — R262 Difficulty in walking, not elsewhere classified: Secondary | ICD-10-CM

## 2020-12-19 DIAGNOSIS — M6281 Muscle weakness (generalized): Secondary | ICD-10-CM

## 2020-12-19 DIAGNOSIS — S14123A 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, initial encounter: Secondary | ICD-10-CM

## 2020-12-20 ENCOUNTER — Encounter: Payer: Self-pay | Admitting: Physical Therapy

## 2020-12-20 NOTE — Therapy (Signed)
[facility] [address] [place], [place], [address] Phone: [phone]   Fax:  [phone]  Physical Therapy Treatment  Patient Details  Name: Meredith Roach MRN: [id number] Date of Birth: Nov 25, 1940 Referring Provider (PT): [name], MD   Encounter Date: 12/19/2020   PT End of Session - 12/20/20 1041     Visit Number 9    Number of Visits 27    Date for PT Re-Evaluation 03/18/21    Progress Note Due on Visit 10    PT Start Time 1120    PT Stop Time 1205    PT Time Calculation (min) 45 min    Equipment Utilized During Treatment Gait belt   Pt uses power wheelchair, clinic's front-wheeled walker utilized for gait   Activity Tolerance Patient tolerated treatment well    Behavior During Therapy [facility] for tasks assessed/performed             Past Medical History:  Diagnosis Date   Arthritis    GERD (gastroesophageal reflux disease)    Gout    Neuromuscular disorder ([place])    Pre-diabetes     Past Surgical History:  Procedure Laterality Date   ANTERIOR CERVICAL DECOMPRESSION/DISCECTOMY FUSION 4 LEVELS N/A 08/28/2019   Procedure: ANTERIOR CERVICAL DECOMPRESSION/DISCECTOMY FUSION CERVICALTHREE-FOUR CERVICAL,FOUR-FIVE,CERVICAL FIVE-SIX,CERVICAL SIX-SEVEN.;  Surgeon: [name], MD;  Location: [facility] [place];  Service: Neurosurgery;  Laterality: N/A;  ANTERIOR   BACK SURGERY     CATARACT EXTRACTION W/PHACO Right 03/31/2018   Procedure: CATARACT EXTRACTION PHACO AND INTRAOCULAR LENS PLACEMENT ([place]);  Surgeon: [name], MD;  Location: [facility];  Service: Ophthalmology;  Laterality: Right;  [place] 00:39.7 CDE 4.35 Fluid Pack Lot # [id number]   CHOLECYSTECTOMY  1995   COLONOSCOPY WITH PROPOFOL N/A 06/07/2017   Procedure: COLONOSCOPY WITH PROPOFOL;  Surgeon: [name], MD;  Location: [facility] END[facility]COPY;  Service: Endoscopy;  Laterality: N/A;   ESOPHAGOGASTRODUODENOSCOPY (EGD) WITH PROPOFOL N/A 06/07/2017   Procedure:  ESOPHAGOGASTRODUODENOSCOPY (EGD) WITH PROPOFOL;  Surgeon: [name], MD;  Location: [facility] ENDOSCOPY;  Service: Endoscopy;  Laterality: N/A;   IR CATHETER TUBE CHANGE  05/10/2020   REDUCTION MAMMAPLASTY Bilateral 1994    There were no vitals filed for this visit.   Subjective Assessment - 12/20/20 1037     Subjective Patient reports tolerating yesterday's treatment well and continuing work on aggressive home program. She denies any recent near-falls or major issues at arrival to PT today.    Patient is accompained by: Family member   husband   Pertinent History Patient is a 80 year old female s/p incomplete SCI. She had incomplete SCI following fall - C4 [place] D (DOI: 08/26/19). Primary activity limitations c difficulty walking, transferring, and completing independent functional mobility following incomplete SCI. She has undergone home health therapy for about one year. She and her husband reports she was able to transfer and ambulate with walker in home with PT walking beside her. Pt has practiced with front-wheel walker at home. 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 Patient reports using steady transfer aid for performing transfers chair to bed and W/C to standard chair. Her husband states that she is able to stand with steady device and can stand with her legs against the bed. Pt has ramp to get in her home for her power wheelchair. Patient lives in one level home. Pt uses tub bench that she can utilize to slide into shower; her daughter/husband assists with bathing. Pt has Lucianne Lei that can  accommodate her power chair at this time. Pt has concrete driveway to get up to her ramp. Pt has small threshold to get into her home - husband utilizes cardboard to get over threshold.    Limitations Walking;Standing;House hold activities;Other (comment)   transferring, bed mobility, bladder management   Patient Stated Goals to be able to walk more independently                Gait: Mild  forward flexed posture onto FWW with decreased gait velocity, mild decrease in L>R toe clearance, decreased heel strike L>R     TREATMENT     Neuromuscular Re-education - for increased motor recruitment of major muscle groups with motor impairment, nervous system priming and exercises to promote trunk stability    Lower limb combined hip/knee flexion and dorsiflexion in unsupported sitting position, tactile cueing for upright posture and to facilitate active dorsiflexion with hip/knee flexion; x25 alternating   Seated pelvic tilt on Dynadisc; alternating hip hike; x20, L/R x20     Standing with FWW with chair behind patient Bilateral upper extremity reaching and grasping in unsupported standing position with cone stacking (cones on metal shelving in center of clinic); x 3 min    Standing bilateral weight shift in static standing with no UE support; x25 alternating   Standing forward kick with active dorsiflexion with verbal cueing prior to movement for "taking foot off of gas pedal"; x20 alternating     *not today* Seated pelvic tilt on Dynadisc; anterior/posterior x20, L/R x20 Seated march on Dynadisc; x20 alternating Grasping with fine motor pulp to pulp grasp, grabbing bolts and placing into adjacent container, in standing x 4 min Standing march, with alternating unilateral upper limb support; x20 Seated toe tapping onto Airex pad to promote active dorsiflexion during LE swing; x20 bilateral LE Lower limb combined hip/knee flexion and dorsiflexion with pawing with VMS for improved dorsiflexor activation. 200 pps phase duration, ramp 1 sec, cycle time 5/5, 32 mA intensity; x 5 minutes each lower extremity Pedal feet, with unilateral upper limb support; x10 with each arm holding single parallel bar         Therapeutic Activities - patient education, repetitive task practice for improved performance of daily functional activities e.g. transferring    Stand pivot transfer to/from  low mat with front-wheeled walker with MinA for completion of sit to stand, supervision for pivot, and CGA for stand to sit. IND setup of power wheelchair prior to performance of transfer; x2   Gait with FWW in clinic x 150 feet with 2 laps around gym; CGA/supervision throughout gait   Forward/backward trunk lean with CGA posteriorly in sitting on Dynadisc; x 20 forward/backward    Standing in walker with standard chair behind patient: Standing march with bilateral UE support on walker (attempted R unilateral support with limited ability to fully lift L foot from ground); 2x15 alternating     *next visit* Standing heel/toe raise   *not today* Bed mobility task practice: performed bilateral rolling with ModA for upper and lower limbs as needed to promote independent bed mobility. MaxA for L arm cross-body adduction to facilitate R rolling. MinA for L rolling.  Bridging; x25, partial range  Seated march on edge of bed for improved lower limb management as needed for car and bed transfers; 20x alternating         ASSESSMENT Patient exhibits improved ability to control trunk extension with eccentric control of thoracolumbar flexors/abdominal mm. She exhibits improving hip hike with RLE  as needed for cleaning/hygiene/toileting; ongoing difficulty with full hip hike of LLE. She exhibits improving ability to clear L toe during gait today with ample time spent on "priming" with tactile cueing for dorsiflexion in sitting and repetitive work on active dorsiflexion in standing. Patient has remaining deficits in increased tone of wrist/digit flexors (L>RUE), impaired trunk control, decreased LE strength and motor recruitment, gait deficits/deviations, decreased L>R shoulder AROM, and impaired postural stability in standing. Patient will benefit from continued skilled therapeutic intervention to address the above deficits as needed for improved function and QoL.        PT Short Term Goals - 11/27/20  1346       PT SHORT TERM GOAL #1   Title Patient will perform stand-pivot transfer from chair to bed with supervision level of assist and ModI setup of power chair as needed for transferring at home    Baseline 11/26/20: Max assist for setup and mod assist to perform sit to stand, unsteady pivot prior to performing stand to sit    Time 4    Period Weeks    Status New    Target Date 12/24/20      PT SHORT TERM GOAL #2   Title Patient will perform standing toe tap to 3-inch surface (Airex pad) with bilateral upper extremity support indicative of improved ability to clear feet from floor during stepping and improved weight shift to each LE with AD use    Baseline 11/26/20: Limited toe clearance during bilateral LE swing phase with front-wheeled walker    Time 3    Period Weeks    Status New    Target Date 12/17/20               PT Long Term Goals - 11/26/20 1752       PT LONG TERM GOAL #1   Title Patient will demonstrate improved function as evidenced by a score of 35 on FOTO measure for full participation in activities at home and in the community.    Baseline 11/26/20: FOTO 12    Time 16    Period Weeks    Status New    Target Date 03/18/21      PT LONG TERM GOAL #2   Title Patient will ambulate for 150 feet without LOB, with sufficient toe clearance to prevent loss of gait stability, and c proper AD placement with least-restrictive assistive device with supervision level of assist    Baseline 11/26/20: Gait x 40 feet with CGA    Time 12    Period Weeks    Target Date 02/19/21      PT LONG TERM GOAL #3   Title Patient will perform independent re-positioning in chair with use of closed-chain triceps on armrests as needed for independent wheelchair mobility with no verbal cueing or tactile cueing required    Baseline 11/26/20: Dependent re-positioning in wheelchair    Time 8    Period Weeks    Status New    Target Date 01/22/21      PT LONG TERM GOAL #4   Title Patient  will perform simulated step-over task for clearing doorway threshold with use of front-wheeled walker as needed for entering/exiting home with supervision level of assistance and no LOB    Baseline 11/26/20: Pt currently uses power wheelchair to negotiate threshold into home    Time 16    Period Weeks    Status New    Target Date 03/18/21  Plan - 12/20/20 1052     Clinical Impression Statement Patient exhibits improved ability to control trunk extension with eccentric control of thoracolumbar flexors/abdominal mm. She exhibits improving hip hike with RLE as needed for cleaning/hygiene/toileting; ongoing difficulty with full hip hike of LLE. She exhibits improving ability to clear L toe during gait today with ample time spent on "priming" with tactile cueing for dorsiflexion in sitting and repetitive work on active dorsiflexion in standing. Patient has remaining deficits in increased tone of wrist/digit flexors (L>RUE), impaired trunk control, decreased LE strength and motor recruitment, gait deficits/deviations, decreased L>R shoulder AROM, and impaired postural stability in standing. Patient will benefit from continued skilled therapeutic intervention to address the above deficits as needed for improved function and QoL.    Personal Factors and Comorbidities Age;Comorbidity 3+;Time since onset of injury/illness/exacerbation    Comorbidities GERD, pre-diabetes, obesity, gout, s/p C3-7 ACDF    Examination-Activity Limitations Bathing;Continence;Reach Overhead;Stairs;Bed Mobility;Dressing;Self Feeding;Stand;Hygiene/Grooming;Toileting;Transfers;Locomotion Level    Examination-Participation Restrictions Community Activity;Yard Work;Laundry;Cleaning;Shop    Stability/Clinical Decision Making Evolving/Moderate complexity    Rehab Potential Fair    PT Frequency Other (comment)   2-3x/week   PT Duration Other (comment)   16 weeks   PT Treatment/Interventions Electrical  Stimulation;Gait training;Stair training;Functional mobility training;Therapeutic activities;Therapeutic exercise;Balance training;Neuromuscular re-education;Wheelchair mobility training;Patient/family education    PT Next Visit Plan Gait re-training, task practice for transferring, LE strengthening, UE AROM and strengthening to assist with functional mobility and transfers    PT Home Exercise Plan None established at initial evaluaiton, will update and supplement existing home health exercises with successive PT visits    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, initial encounter [facility])     Problem List Patient Active Problem List   Diagnosis Date Noted   Suspected deep tissue injury 10/25/2020   Wheelchair dependence 11/27/2019   Spasticity 11/27/2019   Hyperkalemia    Steroid-induced hyperglycemia    Prediabetes    Neurogenic bowel    Neurogenic bladder    Neuropathic pain    Quadriplegia ([place]) 08/31/2019   S/P cervical spinal fusion 08/28/2019   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 [place], [place], [address] Phone: [phone]   Fax:  [phone]  Name: KANIJAH NEST MRN: [id number] Date of Birth: 1940/05/25

## 2020-12-22 NOTE — Progress Notes (Signed)
Suprapubic Cath Change  Patient is present today for a suprapubic catheter change due to urinary retention.  9 ml of water was drained from the balloon, a 16 FR foley cath was removed from the tract with out difficulty.  Site was cleaned and prepped in a sterile fashion with betadine.  A 16 FR foley cath was replaced into the tract no complications were noted. Urine return was noted, 10 ml of sterile water was inflated into the balloon and a night bag was attached for drainage.  Patient tolerated well. A leg bag was given to patient and proper instruction was given on how to switch bags.    Performed by: Zara Council, PA-C   Follow up: One month for SPT exchange

## 2020-12-23 ENCOUNTER — Encounter: Payer: Self-pay | Admitting: Urology

## 2020-12-23 ENCOUNTER — Other Ambulatory Visit: Payer: Self-pay

## 2020-12-23 ENCOUNTER — Ambulatory Visit: Payer: Medicare PPO | Admitting: Physical Therapy

## 2020-12-23 ENCOUNTER — Encounter: Payer: Medicare PPO | Admitting: Physical Therapy

## 2020-12-23 ENCOUNTER — Encounter: Payer: Self-pay | Admitting: Physical Therapy

## 2020-12-23 ENCOUNTER — Ambulatory Visit: Payer: Medicare PPO | Admitting: Urology

## 2020-12-23 VITALS — BP 122/71 | HR 89 | Ht 63.0 in | Wt 200.0 lb

## 2020-12-23 DIAGNOSIS — M6281 Muscle weakness (generalized): Secondary | ICD-10-CM

## 2020-12-23 DIAGNOSIS — Z9359 Other cystostomy status: Secondary | ICD-10-CM

## 2020-12-23 DIAGNOSIS — S14123A 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, initial encounter: Secondary | ICD-10-CM

## 2020-12-23 DIAGNOSIS — R262 Difficulty in walking, not elsewhere classified: Secondary | ICD-10-CM | POA: Diagnosis not present

## 2020-12-23 NOTE — Therapy (Signed)
[facility] [address] [place], [place], [address] Phone: [phone]   Fax:  [phone]  Physical Therapy Treatment/ Physical Therapy Progress Note   Dates of reporting period  11/26/20   to   12/23/20   Patient Details  Name: Meredith Roach MRN: [id number] Date of Birth: Oct 11, 1940 Referring Provider (PT): [name], MD   Encounter Date: 12/23/2020   PT End of Session - 12/23/20 1141     Visit Number 10    Number of Visits 92    Date for PT Re-Evaluation 03/18/21    Authorization Time Period Cert 1/96/22-29/79/89    Progress Note Due on Visit 20    PT Start Time 1144    PT Stop Time 1230    PT Time Calculation (min) 46 min    Equipment Utilized During Treatment Gait belt   Pt uses power wheelchair, clinic's front-wheeled walker utilized for gait   Activity Tolerance Patient tolerated treatment well    Behavior During Therapy WFL for tasks assessed/performed             Past Medical History:  Diagnosis Date   Arthritis    GERD (gastroesophageal reflux disease)    Gout    Neuromuscular disorder ([place])    Pre-diabetes     Past Surgical History:  Procedure Laterality Date   ANTERIOR CERVICAL DECOMPRESSION/DISCECTOMY FUSION 4 LEVELS N/A 08/28/2019   Procedure: ANTERIOR CERVICAL DECOMPRESSION/DISCECTOMY FUSION CERVICALTHREE-FOUR CERVICAL,FOUR-FIVE,CERVICAL FIVE-SIX,CERVICAL SIX-SEVEN.;  Surgeon: [name], MD;  Location: [place];  Service: Neurosurgery;  Laterality: N/A;  ANTERIOR   BACK SURGERY     CATARACT EXTRACTION W/PHACO Right 03/31/2018   Procedure: CATARACT EXTRACTION PHACO AND INTRAOCULAR LENS PLACEMENT ([place]);  Surgeon: [name], MD;  Location: [facility];  Service: Ophthalmology;  Laterality: Right;  [place] 00:39.7 CDE 4.35 Fluid Pack Lot # [id number]   CHOLECYSTECTOMY  1995   COLONOSCOPY WITH PROPOFOL N/A 06/07/2017   Procedure: COLONOSCOPY WITH PROPOFOL;  Surgeon: [name], MD;  Location:  [facility] ENDOSCOPY;  Service: Endoscopy;  Laterality: N/A;   ESOPHAGOGASTRODUODENOSCOPY (EGD) WITH PROPOFOL N/A 06/07/2017   Procedure: ESOPHAGOGASTRODUODENOSCOPY (EGD) WITH PROPOFOL;  Surgeon: [name], MD;  Location: [facility] ENDOSCOPY;  Service: Endoscopy;  Laterality: N/A;   IR CATHETER TUBE CHANGE  05/10/2020   REDUCTION MAMMAPLASTY Bilateral 1994    There were no vitals filed for this visit.   Subjective Assessment - 12/23/20 1154     Subjective Patient reports using FWW in her home to get to recliner versus using Steady. She reports 50% SANE score at this time. Patient reports no major LOB or near-fall incidents recently. Pt feels she is standing better now. Patient reports good satisfaction with outpatient rehab to date. Patient reports she can move her legs around in bed okay, but she has difficulty with bed mobility. She reports improved independence with supine to sit. She does report some comorbid discomfort diffusely in her L lower limb over the weekend with sense of "fullness" in leg/foot.    Patient is accompained by: Family member   husband   Pertinent History Patient is a 80 year old female s/p incomplete SCI. She had incomplete SCI following fall - C4 [place] D (DOI: 08/26/19). Primary activity limitations c difficulty walking, transferring, and completing independent functional mobility following incomplete SCI. She has undergone home health therapy for about one year. She and her husband reports she was able to transfer and ambulate with walker in home with PT walking beside her.  Pt has practiced with front-wheel walker at home. 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 Patient reports using steady transfer aid for performing transfers chair to bed and W/C to standard chair. Her husband states that she is able to stand with steady device and can stand with her legs against the bed. Pt has ramp to get in her home for her power wheelchair. Patient lives in one level home. Pt  uses tub bench that she can utilize to slide into shower; her daughter/husband assists with bathing. Pt has Lucianne Lei that can accommodate her power chair at this time. Pt has concrete driveway to get up to her ramp. Pt has small threshold to get into her home - husband utilizes cardboard to get over threshold.    Limitations Walking;Standing;House hold activities;Other (comment)   transferring, bed mobility, bladder management   Patient Stated Goals to be able to walk more independently                OBJECTIVE     Posture Forward head, rounded shoulders posture in chair, patient rests in posterior pelvic tilt. Improved posture with attention brought to sitting position.    Trunk AROM WNL Upper trunk AROM; Lower trunk: Anterior and posterior pelvic tilt, limited hip hiking bilaterally   Gait Mild forward flexed posture onto FWW with decreased gait velocity, Decrease in L>R toe clearance following fatigue, decreased heel strike L>R   Upper extremity AROM Shoulder Flexion R 110, L 92 deg  Elbow extension -10 bilaterally Elbow flexion WFL bilaterally   Strength R/L 4-/3+ Shoulder flexion 4-/4- Shoulder abduction 4+/4 Biceps 4+/4- Triceps 2/2- Hip flexion 4+/4+ Hip abduction 5/5 Hip adduction 4+/4 Knee extension 4/4 Knee flexion 4-/4- Ankle Plantarflexion 4+/4 Ankle Dorsiflexion   Vascular Screen No wound or ulcers in bilateral feet/toes Pulses for dorsalis pedis and posterior tibial artery are 2+ bilaterally. Mild erythema along MTPs/digits. No pitting edema in legs/feet.    FUNCTIONAL TASKS Stand Pivot Transfer: Independent for power chair placement relative to low mat. Contact guard assist for transfer with Min assist to attain lift-off for sit to stand with patient utilizing heavy LUE assist to unweight pelvis and to control descent during stand to sit.   Rolling: MaxA for positioning L lower limb and L upper limb to facilitate rolling. ModA for active roll to R side.  Independent with rolling sidelying to supine bilaterally.   Supine to/from sit: IND with moving lower limbs to edge of bed. IND with sitting to supine, though MaxA is required to re-position pelvis to facilitate scooting.         TREATMENT     Neuromuscular Re-education - for increased motor recruitment of major muscle groups with motor impairment, nervous system priming and exercises to promote trunk stability     -Seated pelvic tilt on Dynadisc; alternating hip hike; x25 alternating L/R   -Seated forward/backward trunk lean on Dynadisc; x25     *NEXT VISIT*  Lower limb combined hip/knee flexion and dorsiflexion in unsupported sitting position, tactile cueing for upright posture and to facilitate active dorsiflexion with hip/knee flexion; x25 alternating Standing with FWW with chair behind patient -Bilateral upper extremity reaching and grasping in unsupported standing position with cone stacking (cones on metal shelving in center of clinic); x 3 min  -Standing bilateral weight shift in static standing with no UE support; x25 alternating -Standing forward kick with active dorsiflexion with verbal cueing prior to movement for "taking foot off of gas pedal"; x20 alternating      *  not today* Seated pelvic tilt on Dynadisc; anterior/posterior x20, L/R x20 Seated march on Dynadisc; x20 alternating Grasping with fine motor pulp to pulp grasp, grabbing bolts and placing into adjacent container, in standing x 4 min Standing march, with alternating unilateral upper limb support; x20 Seated toe tapping onto Airex pad to promote active dorsiflexion during LE swing; x20 bilateral LE Lower limb combined hip/knee flexion and dorsiflexion with pawing with VMS for improved dorsiflexor activation. 200 pps phase duration, ramp 1 sec, cycle time 5/5, 32 mA intensity; x 5 minutes each lower extremity Pedal feet, with unilateral upper limb support; x10 with each arm holding single parallel bar          Therapeutic Activities - patient education, repetitive task practice for improved performance of daily functional activities e.g. transferring   -Reassessment performed (see above)  Stand pivot transfer to/from low mat with front-wheeled walker with MinA for completion of sit to stand, supervision for pivot, and CGA for stand to sit. IND setup of power wheelchair prior to performance of transfer; x2   Gait with FWW in clinic x 150 feet with 2 laps around gym; CGA/supervision throughout gait             *next visit* Standing heel/toe raise Standing in walker with standard chair behind patient: Standing march with bilateral UE support on walker (attempted R unilateral support with limited ability to fully lift L foot from ground); 2x15 alternating   *not today* Bed mobility task practice: performed bilateral rolling with ModA for upper and lower limbs as needed to promote independent bed mobility. MaxA for L arm cross-body adduction to facilitate R rolling. MinA for L rolling.  Bridging; x25, partial range  Seated march on edge of bed for improved lower limb management as needed for car and bed transfers; 20x alternating         ASSESSMENT Patient exhibits ongoing shoulder AROM deficits (utilizing reaching and grasping tasks to work on active upper limb motion and gripping/prehensile skills). She has ongoing deficits in deltoid, triceps, hip flexion, quadriceps, hamstrings, and dorsiflexor strength with left side being more affected. Pt exhibits modestly improved MMTs for LE antigravity musculature relative to her initial evaluation. She has significantly improved in independence with stand-pivot transfers with front-wheel walker and has modestly improved with bed mobility. She has improved FOTO score, but has not yet attained long-term goal for FOTO. She has exhibited safe bilateral LE weight shift with bilateral upper limb support on front-wheel walker. This is significantly  challenging with unilateral or no UE support.  Patient has remaining deficits in increased tone of wrist/digit flexors (L>RUE), impaired trunk control, decreased LE strength and motor recruitment, gait deficits/deviations (most notably difficulty with L>R toe clearance during gait), decreased L>R shoulder AROM, and impaired postural stability in standing. Patient will benefit from continued skilled therapeutic intervention to address the above deficits as needed for improved function and QoL.         PT Short Term Goals - 12/23/20 1354       PT SHORT TERM GOAL #1   Title Patient will perform stand-pivot transfer from chair to bed with supervision level of assist and ModI setup of power chair as needed for transferring at home    Baseline 11/26/20: Max assist for setup and mod assist to perform sit to stand, unsteady pivot prior to performing stand to sit.    12/23/20: Patient is able to setup power chair for transfer ModI. Mod to Max assist for sit to  stand. CGA level of assist for standing pivot and stand to sit on low mat.    Time 4    Period Weeks    Status Partially Met    Target Date 01/09/21      PT SHORT TERM GOAL #2   Title Patient will perform standing toe tap to 3-inch surface (Airex pad) with bilateral upper extremity support indicative of improved ability to clear feet from floor during stepping and improved weight shift to each LE with AD use    Baseline 11/26/20: Limited toe clearance during bilateral LE swing phase with front-wheeled walker.   12/23/20: performed at previous follow-up with bilateral UE support.    Time 3    Period Weeks    Status Achieved    Target Date 12/17/20               PT Long Term Goals - 12/23/20 1312       PT LONG TERM GOAL #1   Title Patient will demonstrate improved function as evidenced by a score of 35 on FOTO measure for full participation in activities at home and in the community.    Baseline 11/26/20: FOTO 12.   12/23/20: FOTO 24.     Time 16    Period Weeks    Status Partially Met    Target Date 03/18/21      PT LONG TERM GOAL #2   Title Patient will ambulate for 150 feet without LOB, with sufficient toe clearance to prevent loss of gait stability, and c proper AD placement with least-restrictive assistive device with supervision level of assist    Baseline 11/26/20: Gait x 40 feet with CGA.   12/23/20: CGA with dec toe clearance L>RLE.    Time 12    Period Weeks    Status On-going    Target Date 02/19/21      PT LONG TERM GOAL #3   Title Patient will perform independent re-positioning in chair with use of closed-chain triceps on armrests as needed for independent wheelchair mobility with no verbal cueing or tactile cueing required    Baseline 11/26/20: Dependent re-positioning in wheelchair.   12/23/20: Performed with feet on fold-out platform of her power wheelchair IND.    Time 8    Period Weeks    Status Achieved    Target Date 01/22/21      PT LONG TERM GOAL #4   Title Patient will perform simulated step-over task for clearing doorway threshold with use of front-wheeled walker as needed for entering/exiting home with supervision level of assistance and no LOB    Baseline 11/26/20: Pt currently uses power wheelchair to negotiate threshold into home.   12/23/20: Deferred    Time 16    Period Weeks    Status Deferred    Target Date 03/18/21                   Plan - 12/23/20 1502     Clinical Impression Statement Patient exhibits ongoing shoulder AROM deficits (utilizing reaching and grasping tasks to work on active upper limb motion and gripping/prehensile skills). She has ongoing deficits in deltoid, triceps, hip flexion, quadriceps, hamstrings, and dorsiflexor strength with left side being more affected. Pt exhibits modestly improved MMTs for LE antigravity musculature relative to her initial evaluation. She has significantly improved in independence with stand-pivot transfers with front-wheel walker and  has modestly improved with bed mobility. She has improved FOTO score, but has not yet attained long-term goal for  FOTO. She has exhibited safe bilateral LE weight shift with bilateral upper limb support on front-wheel walker. This is significantly challenging with unilateral or no UE support.  Patient has remaining deficits in increased tone of wrist/digit flexors (L>RUE), impaired trunk control, decreased LE strength and motor recruitment, gait deficits/deviations (most notably difficulty with L>R toe clearance during gait), decreased L>R shoulder AROM, and impaired postural stability in standing. Patient will benefit from continued skilled therapeutic intervention to address the above deficits as needed for improved function and QoL.    Personal Factors and Comorbidities Age;Comorbidity 3+;Time since onset of injury/illness/exacerbation    Comorbidities GERD, pre-diabetes, obesity, gout, s/p C3-7 ACDF    Examination-Activity Limitations Bathing;Continence;Reach Overhead;Stairs;Bed Mobility;Dressing;Self Feeding;Stand;Hygiene/Grooming;Toileting;Transfers;Locomotion Level    Examination-Participation Restrictions Community Activity;Yard Work;Laundry;Cleaning;Shop    Stability/Clinical Decision Making Evolving/Moderate complexity    Rehab Potential Fair    PT Frequency Other (comment)   2-3x/week   PT Duration Other (comment)   16 weeks   PT Treatment/Interventions Electrical Stimulation;Gait training;Stair training;Functional mobility training;Therapeutic activities;Therapeutic exercise;Balance training;Neuromuscular re-education;Wheelchair mobility training;Patient/family education    PT Next Visit Plan Gait re-training, task practice for transferring, LE strengthening, UE AROM and strengthening to assist with functional mobility and transfers    PT Home Exercise Plan Pt continuing with home health exercises. Pt continuing with task practice for rolling, scooting, bridging in bed; walking with CGA with  FWW; reaching and grasping with LUE.    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, initial encounter [facility])     Problem List Patient Active Problem List   Diagnosis Date Noted   Suspected deep tissue injury 10/25/2020   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 [place], [place], [address] Phone: [phone]   Fax:  [phone]  Name: Meredith Roach MRN: [id number] Date of Birth: [birth year]

## 2020-12-25 ENCOUNTER — Encounter: Payer: Self-pay | Admitting: Physical Therapy

## 2020-12-25 ENCOUNTER — Ambulatory Visit: Payer: Medicare PPO | Admitting: Physical Therapy

## 2020-12-25 ENCOUNTER — Encounter: Payer: Medicare PPO | Admitting: Physical Therapy

## 2020-12-25 ENCOUNTER — Other Ambulatory Visit: Payer: Self-pay

## 2020-12-25 DIAGNOSIS — R262 Difficulty in walking, not elsewhere classified: Secondary | ICD-10-CM | POA: Diagnosis not present

## 2020-12-25 DIAGNOSIS — S14123A 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, initial encounter: Secondary | ICD-10-CM

## 2020-12-25 DIAGNOSIS — M6281 Muscle weakness (generalized): Secondary | ICD-10-CM

## 2020-12-25 NOTE — Therapy (Signed)
[facility] [address] [place], [place], [address] Phone: [phone]   Fax:  [phone]  Physical Therapy Treatment  Patient Details  Name: Meredith Roach MRN: [id number] Date of Birth: March 19, 1941 Referring Provider (PT): [name], MD   Encounter Date: 12/25/2020   PT End of Session - 12/25/20 1309     Visit Number 11    Number of Visits 69    Date for PT Re-Evaluation 03/18/21    Authorization Time Period Cert 0/93/81-82/99/37, last progress note 12/23/20    Progress Note Due on Visit 20    PT Start Time 1143    PT Stop Time 1230    PT Time Calculation (min) 47 min    Equipment Utilized During Treatment Gait belt   Pt uses power wheelchair, clinic's front-wheeled walker utilized for gait   Activity Tolerance Patient tolerated treatment well    Behavior During Therapy [facility] for tasks assessed/performed             Past Medical History:  Diagnosis Date   Arthritis    GERD (gastroesophageal reflux disease)    Gout    Neuromuscular disorder ([place])    Pre-diabetes     Past Surgical History:  Procedure Laterality Date   ANTERIOR CERVICAL DECOMPRESSION/DISCECTOMY FUSION 4 LEVELS N/A 08/28/2019   Procedure: ANTERIOR CERVICAL DECOMPRESSION/DISCECTOMY FUSION CERVICALTHREE-FOUR CERVICAL,FOUR-FIVE,CERVICAL FIVE-SIX,CERVICAL SIX-SEVEN.;  Surgeon: [name], MD;  Location: [place];  Service: Neurosurgery;  Laterality: N/A;  ANTERIOR   BACK SURGERY     CATARACT EXTRACTION W/PHACO Right 03/31/2018   Procedure: CATARACT EXTRACTION PHACO AND INTRAOCULAR LENS PLACEMENT ([place]);  Surgeon: [name], MD;  Location: [facility];  Service: Ophthalmology;  Laterality: Right;  [place] 00:39.7 CDE 4.35 Fluid Pack Lot # [id number]   CHOLECYSTECTOMY  1995   COLONOSCOPY WITH PROPOFOL N/A 06/07/2017   Procedure: COLONOSCOPY WITH PROPOFOL;  Surgeon: [name], MD;  Location:  [facility];  Service: Endoscopy;  Laterality: N/A;    ESOPHAGOGASTRODUODENOSCOPY (EGD) WITH PROPOFOL N/A 06/07/2017   Procedure: ESOPHAGOGASTRODUODENOSCOPY (EGD) WITH PROPOFOL;  Surgeon: [name], MD;  Location: [facility] ENDOSCOPY;  Service: Endoscopy;  Laterality: N/A;   IR CATHETER TUBE CHANGE  05/10/2020   REDUCTION MAMMAPLASTY Bilateral 1994    There were no vitals filed for this visit.   Subjective Assessment - 12/25/20 1307     Subjective Patient reports doing better with LLE stiffness versus how she felt Monday. She reports continued work on standing in walker and performing weightshifting in her walker at home. Patient has also worked on seated trunk leans (anterior/posterior, left/right) on edge of bed at home. She denies any new complaints following Foley catheter change earlier this week.    Patient is accompained by: Family member   husband   Pertinent History Patient is a 80 year old female s/p incomplete SCI. She had incomplete SCI following fall - C4 [place] D (DOI: 08/26/19). Primary activity limitations c difficulty walking, transferring, and completing independent functional mobility following incomplete SCI. She has undergone home health therapy for about one year. She and her husband reports she was able to transfer and ambulate with walker in home with PT walking beside her. Pt has practiced with front-wheel walker at home. 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 Patient reports using steady transfer aid for performing transfers chair to bed and W/C to standard chair. Her husband states that she is able to stand with steady device and can stand with her legs against  the bed. Pt has ramp to get in her home for her power wheelchair. Patient lives in one level home. Pt uses tub bench that she can utilize to slide into shower; her daughter/husband assists with bathing. Pt has Lucianne Lei that can accommodate her power chair at this time. Pt has concrete driveway to get up to her ramp. Pt has small threshold to get into her home -  husband utilizes cardboard to get over threshold.    Limitations Walking;Standing;House hold activities;Other (comment)   transferring, bed mobility, bladder management   Patient Stated Goals to be able to walk more independently               TREATMENT     Neuromuscular Re-education - for increased motor recruitment of major muscle groups with motor impairment, nervous system priming and exercises to promote trunk stability    Lower limb combined hip/knee flexion and dorsiflexion in unsupported sitting position, tactile cueing for upright posture and to facilitate active dorsiflexion with hip/knee flexion; x25 alternating   Seated pelvic tilt on Dynadisc; alternating hip hike; x25 alternating L/R   Seated forward/backward trunk lean on Dynadisc; x25   Seated march on Dynadisc with alternating UE/LE; x20 alternating   Standing with FWW with wheelchair behind patient -Bilateral upper extremity reaching and grasping in unsupported standing position with fine motor grasping and placing items into adjacent container (small clothes pins, small cylinder and cubes from "HAND PT" packet); x 5 min  [Also attempted placing clothes pins onto metal shelving - pt able to attain 3 but had notable difficulty with pinch grip strength and attaching to metal shelving]        *not today* -Standing forward kick with active dorsiflexion with verbal cueing prior to movement for "taking foot off of gas pedal"; x20 alternating Seated pelvic tilt on Dynadisc; anterior/posterior x20, L/R x20 -Standing bilateral weight shift in static standing with no UE support; x25 alternating Grasping with fine motor pulp to pulp grasp, grabbing bolts and placing into adjacent container, in standing x 4 min Standing march, with alternating unilateral upper limb support; x20 Seated toe tapping onto Airex pad to promote active dorsiflexion during LE swing; x20 bilateral LE Lower limb combined hip/knee flexion and  dorsiflexion with pawing with VMS for improved dorsiflexor activation. 200 pps phase duration, ramp 1 sec, cycle time 5/5, 32 mA intensity; x 5 minutes each lower extremity Pedal feet, with unilateral upper limb support; x10 with each arm holding single parallel bar         Therapeutic Activities - patient education, repetitive task practice for improved performance of daily functional activities e.g. transferring    Stand pivot transfer to/from low mat with front-wheeled walker with MinA for completion of sit to stand, supervision for pivot, and CGA for stand to sit. IND setup of power wheelchair prior to performance of transfer; x2  Sit to stand from raised low mat; 2x10, with no UE assist   Gait with FWW in clinic x 150 feet with 2 laps around gym; CGA/supervision throughout gait (2 circles around gym)   Standing heel/toe raise; 2x15 alternating; standing in FWW with power chair behind patient [mild instability with posterior weight shift during toe raise]        *not today* Standing march with bilateral UE support on walker (attempted R unilateral support with limited ability to fully lift L foot from ground); 2x15 alternating Bed mobility task practice: performed bilateral rolling with ModA for upper and lower limbs as needed to  promote independent bed mobility. MaxA for L arm cross-body adduction to facilitate R rolling. MinA for L rolling.  Bridging; x25, partial range  Seated march on edge of bed for improved lower limb management as needed for car and bed transfers; 20x alternating         ASSESSMENT Patient demonstrates fair control with fine motor gripping (using pincer grasp) with grasping and moving small items. She is significantly challenged with attempted use of clothes pins in clinic with diminished pinch grip strength. She needs further work on improving L shoulder AROM and improving L toe clearance during gait. L toe clearance during swing phase has markedly  improved, but she does intermittently drag her L toe during gait with front-wheel walker. She is able to perform IND sit to stand without UE support on raised table. Patient has remaining deficits in increased tone of wrist/digit flexors (L>RUE), impaired trunk control, decreased LE strength and motor recruitment, gait deficits/deviations (most notably difficulty with L>R toe clearance during gait), decreased L>R shoulder AROM, and impaired postural stability in standing. Patient will benefit from continued skilled therapeutic intervention to address the above deficits as needed for improved function and QoL.         PT Short Term Goals - 12/23/20 1354       PT SHORT TERM GOAL #1   Title Patient will perform stand-pivot transfer from chair to bed with supervision level of assist and ModI setup of power chair as needed for transferring at home    Baseline 11/26/20: Max assist for setup and mod assist to perform sit to stand, unsteady pivot prior to performing stand to sit.    12/23/20: Patient is able to setup power chair for transfer ModI. Mod to Max assist for sit to stand. CGA level of assist for standing pivot and stand to sit on low mat.    Time 4    Period Weeks    Status Partially Met    Target Date 01/09/21      PT SHORT TERM GOAL #2   Title Patient will perform standing toe tap to 3-inch surface (Airex pad) with bilateral upper extremity support indicative of improved ability to clear feet from floor during stepping and improved weight shift to each LE with AD use    Baseline 11/26/20: Limited toe clearance during bilateral LE swing phase with front-wheeled walker.   12/23/20: performed at previous follow-up with bilateral UE support.    Time 3    Period Weeks    Status Achieved    Target Date 12/17/20               PT Long Term Goals - 12/23/20 1312       PT LONG TERM GOAL #1   Title Patient will demonstrate improved function as evidenced by a score of 35 on FOTO measure for  full participation in activities at home and in the community.    Baseline 11/26/20: FOTO 12.   12/23/20: FOTO 24.    Time 16    Period Weeks    Status Partially Met    Target Date 03/18/21      PT LONG TERM GOAL #2   Title Patient will ambulate for 150 feet without LOB, with sufficient toe clearance to prevent loss of gait stability, and c proper AD placement with least-restrictive assistive device with supervision level of assist    Baseline 11/26/20: Gait x 40 feet with CGA.   12/23/20: CGA with dec toe clearance L>RLE.  Time 12    Period Weeks    Status On-going    Target Date 02/19/21      PT LONG TERM GOAL #3   Title Patient will perform independent re-positioning in chair with use of closed-chain triceps on armrests as needed for independent wheelchair mobility with no verbal cueing or tactile cueing required    Baseline 11/26/20: Dependent re-positioning in wheelchair.   12/23/20: Performed with feet on fold-out platform of her power wheelchair IND.    Time 8    Period Weeks    Status Achieved    Target Date 01/22/21      PT LONG TERM GOAL #4   Title Patient will perform simulated step-over task for clearing doorway threshold with use of front-wheeled walker as needed for entering/exiting home with supervision level of assistance and no LOB    Baseline 11/26/20: Pt currently uses power wheelchair to negotiate threshold into home.   12/23/20: Deferred    Time 16    Period Weeks    Status Deferred    Target Date 03/18/21                   Plan - 12/25/20 1318     Clinical Impression Statement Patient demonstrates fair control with fine motor gripping (using pincer grasp) with grasping and moving small items. She is significantly challenged with attempted use of clothes pins in clinic with diminished pinch grip strength. She needs further work on improving L shoulder AROM and improving L toe clearance during gait. L toe clearance during swing phase has markedly improved,  but she does intermittently drag her L toe during gait with front-wheel walker. She is able to perform IND sit to stand without UE support on raised table. Patient has remaining deficits in increased tone of wrist/digit flexors (L>RUE), impaired trunk control, decreased LE strength and motor recruitment, gait deficits/deviations (most notably difficulty with L>R toe clearance during gait), decreased L>R shoulder AROM, and impaired postural stability in standing. Patient will benefit from continued skilled therapeutic intervention to address the above deficits as needed for improved function and QoL.    Personal Factors and Comorbidities Age;Comorbidity 3+;Time since onset of injury/illness/exacerbation    Comorbidities GERD, pre-diabetes, obesity, gout, s/p C3-7 ACDF    Examination-Activity Limitations Bathing;Continence;Reach Overhead;Stairs;Bed Mobility;Dressing;Self Feeding;Stand;Hygiene/Grooming;Toileting;Transfers;Locomotion Level    Examination-Participation Restrictions Community Activity;Yard Work;Laundry;Cleaning;Shop    Stability/Clinical Decision Making Evolving/Moderate complexity    Rehab Potential Fair    PT Frequency Other (comment)   2-3x/week   PT Duration Other (comment)   16 weeks   PT Treatment/Interventions Electrical Stimulation;Gait training;Stair training;Functional mobility training;Therapeutic activities;Therapeutic exercise;Balance training;Neuromuscular re-education;Wheelchair mobility training;Patient/family education    PT Next Visit Plan Gait re-training, task practice for transferring, LE strengthening, UE AROM and strengthening to assist with functional mobility and transfers    PT Home Exercise Plan Pt continuing with home health exercises. Pt continuing with task practice for rolling, scooting, bridging in bed; walking with CGA with FWW; reaching and grasping with LUE.    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, initial encounter (  [facility])     Problem List Patient Active Problem List   Diagnosis Date Noted   Suspected deep tissue injury 10/25/2020   Wheelchair dependence 11/27/2019   Spasticity 11/27/2019   Hyperkalemia    Steroid-induced hyperglycemia    Prediabetes    Neurogenic bowel    Neurogenic bladder    Neuropathic pain    Quadriplegia ([place]) 08/31/2019   S/P cervical spinal fusion 08/28/2019   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 [place], [place], [address] Phone: [phone]   Fax:  [phone]  Name: Meredith Roach MRN: [id number] Date of Birth: [birth year]

## 2020-12-26 ENCOUNTER — Ambulatory Visit: Payer: Medicare PPO | Admitting: Physical Therapy

## 2020-12-26 ENCOUNTER — Encounter: Payer: Self-pay | Admitting: Physical Therapy

## 2020-12-26 DIAGNOSIS — R262 Difficulty in walking, not elsewhere classified: Secondary | ICD-10-CM

## 2020-12-26 DIAGNOSIS — S14123A 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, initial encounter: Secondary | ICD-10-CM

## 2020-12-26 DIAGNOSIS — M6281 Muscle weakness (generalized): Secondary | ICD-10-CM

## 2020-12-26 NOTE — Therapy (Signed)
[facility] [address] [place], [place], [address] Phone: [phone]   Fax:  [phone]  Physical Therapy Treatment  Patient Details  Name: Meredith Roach MRN: [id number] Date of Birth: [birth year] Referring Provider (PT): [name], MD   Encounter Date: 12/26/2020   PT End of Session - 12/27/20 2105     Visit Number 12    Number of Visits 62    Date for PT Re-Evaluation 03/18/21    Authorization Time Period Cert 11/29/27-93/71/69, last progress note 12/23/20    Progress Note Due on Visit 20    PT Start Time 1345    PT Stop Time 1431    PT Time Calculation (min) 46 min    Equipment Utilized During Treatment Gait belt   Pt uses power wheelchair, clinic's front-wheeled walker utilized for gait   Activity Tolerance Patient tolerated treatment well    Behavior During Therapy WFL for tasks assessed/performed             Past Medical History:  Diagnosis Date   Arthritis    GERD (gastroesophageal reflux disease)    Gout    Neuromuscular disorder ([place])    Pre-diabetes     Past Surgical History:  Procedure Laterality Date   ANTERIOR CERVICAL DECOMPRESSION/DISCECTOMY FUSION 4 LEVELS N/A 08/28/2019   Procedure: ANTERIOR CERVICAL DECOMPRESSION/DISCECTOMY FUSION CERVICALTHREE-FOUR CERVICAL,FOUR-FIVE,CERVICAL FIVE-SIX,CERVICAL SIX-SEVEN.;  Surgeon: [name], MD;  Location: [facility];  Service: Neurosurgery;  Laterality: N/A;  ANTERIOR   BACK SURGERY     CATARACT EXTRACTION W/PHACO Right 03/31/2018   Procedure: CATARACT EXTRACTION PHACO AND INTRAOCULAR LENS PLACEMENT ([place]);  Surgeon: [name], MD;  Location: [facility];  Service: Ophthalmology;  Laterality: Right;  [place] 00:39.7 CDE 4.35 Fluid Pack Lot # [id number]   CHOLECYSTECTOMY  1995   COLONOSCOPY WITH PROPOFOL N/A 06/07/2017   Procedure: COLONOSCOPY WITH PROPOFOL;  Surgeon: [name], MD;  Location: [facility] ENDOSCOPY;  Service: Endoscopy;  Laterality: N/A;    ESOPHAGOGASTRODUODENOSCOPY (EGD) WITH PROPOFOL N/A 06/07/2017   Procedure: ESOPHAGOGASTRODUODENOSCOPY (EGD) WITH PROPOFOL;  Surgeon: [name], MD;  [facility];  Service: Endoscopy;  Laterality: N/A;   IR CATHETER TUBE CHANGE  05/10/2020   REDUCTION MAMMAPLASTY Bilateral 1994    There were no vitals filed for this visit.   Subjective Assessment - 12/27/20 2104     Subjective Patient reports tolerating last session well and reports moderate soreness in the evening after her last visit. She reports no significant complaints at arrival to PT. Pt is compliant with established HEP and is continuing gait with FWW in home with SBA from her husband and is working on trunk control exercise on edge of bed.    Patient is accompained by: Family member   husband   Pertinent History Patient is a 80 year old female s/p incomplete SCI. She had incomplete SCI following fall - C4 [place] D (DOI: 08/26/19). Primary activity limitations c difficulty walking, transferring, and completing independent functional mobility following incomplete SCI. She has undergone home health therapy for about one year. She and her husband reports she was able to transfer and ambulate with walker in home with PT walking beside her. Pt has practiced with front-wheel walker at home. 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 Patient reports using steady transfer aid for performing transfers chair to bed and W/C to standard chair. Her husband states that she is able to stand with steady device and can stand with her legs against  the bed. Pt has ramp to get in her home for her power wheelchair. Patient lives in one level home. Pt uses tub bench that she can utilize to slide into shower; her daughter/husband assists with bathing. Pt has Lucianne Lei that can accommodate her power chair at this time. Pt has concrete driveway to get up to her ramp. Pt has small threshold to get into her home - husband utilizes cardboard to get over  threshold.    Limitations Walking;Standing;House hold activities;Other (comment)   transferring, bed mobility, bladder management   Patient Stated Goals to be able to walk more independently               TREATMENT     Neuromuscular Re-education - for increased motor recruitment of major muscle groups with motor impairment, nervous system priming and exercises to promote trunk stability       Seated pelvic tilt on Dynadisc; alternating hip hike; x25 alternating L/R   Seated forward/backward trunk lean on Dynadisc; x25   Seated march on Dynadisc with alternating UE/LE; x20 alternating     Standing with FWW with wheelchair behind patient -Bilateral upper extremity reaching and grasping in unsupported standing position with fine motor grasping and placing items into adjacent container (small clothes pins, small cylinder and cubes from "HAND PT" packet); x 5 min    -Standing hip flexion with cueing for active dorsiflexion for motor priming to improve active DF during gait; x20 alternating LE     *not today* Lower limb combined hip/knee flexion and dorsiflexion in unsupported sitting position, tactile cueing for upright posture and to facilitate active dorsiflexion with hip/knee flexion; x25 alternating  -Standing forward kick with active dorsiflexion with verbal cueing prior to movement for "taking foot off of gas pedal"; x20 alternating Seated pelvic tilt on Dynadisc; anterior/posterior x20, L/R x20 -Standing bilateral weight shift in static standing with no UE support; x25 alternating Grasping with fine motor pulp to pulp grasp, grabbing bolts and placing into adjacent container, in standing x 4 min Standing march, with alternating unilateral upper limb support; x20 Seated toe tapping onto Airex pad to promote active dorsiflexion during LE swing; x20 bilateral LE Lower limb combined hip/knee flexion and dorsiflexion with pawing with VMS for improved dorsiflexor activation. 200  pps phase duration, ramp 1 sec, cycle time 5/5, 32 mA intensity; x 5 minutes each lower extremity Pedal feet, with unilateral upper limb support; x10 with each arm holding single parallel bar         Therapeutic Activities - patient education, repetitive task practice for improved performance of daily functional activities e.g. transferring     Stand pivot transfer to/from low mat with front-wheeled walker with MinA for completion of sit to stand, supervision for pivot, and CGA for stand to sit. IND setup of power wheelchair prior to performance of transfer; x2   Sit to stand from raised low mat; 2x10, with no UE assist   Gait with FWW in clinic x 150 feet with 2 laps around gym; CGA/supervision throughout gait (2 circles around gym)   Standing heel/toe raise; 2x15 alternating; standing in FWW with power chair behind patient [mild instability with posterior weight shift during toe raise]             *not today* Standing march with bilateral UE support on walker (attempted R unilateral support with limited ability to fully lift L foot from ground); 2x15 alternating Bed mobility task practice: performed bilateral rolling with ModA for upper and lower limbs  as needed to promote independent bed mobility. MaxA for L arm cross-body adduction to facilitate R rolling. MinA for L rolling.  Bridging; x25, partial range  Seated march on edge of bed for improved lower limb management as needed for car and bed transfers; 20x alternating         ASSESSMENT Patient is abe to clear bilateral toes better with verbal cueing and "priming" prior to gait for active dorsiflexion during combined hip/knee flexion. She exhibits improving ability to perform reaching and grasping with small items using spherical grip and pincer grasp. Patient does have decreased toe clearance bilaterally (worse on L) that is worsened with fatigue. She demonstrates ongoing improvement in transfers from wheelchair and performance  of sit to stand. Patient has remaining deficits in increased tone of wrist/digit flexors (L>RUE), impaired trunk control, decreased LE strength and motor recruitment, gait deficits/deviations (most notably difficulty with L>R toe clearance during gait), decreased L>R shoulder AROM, and impaired postural stability in standing. Patient will benefit from continued skilled therapeutic intervention to address the above deficits as needed for improved function and QoL.           PT Short Term Goals - 12/23/20 1354       PT SHORT TERM GOAL #1   Title Patient will perform stand-pivot transfer from chair to bed with supervision level of assist and ModI setup of power chair as needed for transferring at home    Baseline 11/26/20: Max assist for setup and mod assist to perform sit to stand, unsteady pivot prior to performing stand to sit.    12/23/20: Patient is able to setup power chair for transfer ModI. Mod to Max assist for sit to stand. CGA level of assist for standing pivot and stand to sit on low mat.    Time 4    Period Weeks    Status Partially Met    Target Date 01/09/21      PT SHORT TERM GOAL #2   Title Patient will perform standing toe tap to 3-inch surface (Airex pad) with bilateral upper extremity support indicative of improved ability to clear feet from floor during stepping and improved weight shift to each LE with AD use    Baseline 11/26/20: Limited toe clearance during bilateral LE swing phase with front-wheeled walker.   12/23/20: performed at previous follow-up with bilateral UE support.    Time 3    Period Weeks    Status Achieved    Target Date 12/17/20               PT Long Term Goals - 12/23/20 1312       PT LONG TERM GOAL #1   Title Patient will demonstrate improved function as evidenced by a score of 35 on FOTO measure for full participation in activities at home and in the community.    Baseline 11/26/20: FOTO 12.   12/23/20: FOTO 24.    Time 16    Period Weeks     Status Partially Met    Target Date 03/18/21      PT LONG TERM GOAL #2   Title Patient will ambulate for 150 feet without LOB, with sufficient toe clearance to prevent loss of gait stability, and c proper AD placement with least-restrictive assistive device with supervision level of assist    Baseline 11/26/20: Gait x 40 feet with CGA.   12/23/20: CGA with dec toe clearance L>RLE.    Time 12    Period Weeks  Status On-going    Target Date 02/19/21      PT LONG TERM GOAL #3   Title Patient will perform independent re-positioning in chair with use of closed-chain triceps on armrests as needed for independent wheelchair mobility with no verbal cueing or tactile cueing required    Baseline 11/26/20: Dependent re-positioning in wheelchair.   12/23/20: Performed with feet on fold-out platform of her power wheelchair IND.    Time 8    Period Weeks    Status Achieved    Target Date 01/22/21      PT LONG TERM GOAL #4   Title Patient will perform simulated step-over task for clearing doorway threshold with use of front-wheeled walker as needed for entering/exiting home with supervision level of assistance and no LOB    Baseline 11/26/20: Pt currently uses power wheelchair to negotiate threshold into home.   12/23/20: Deferred    Time 16    Period Weeks    Status Deferred    Target Date 03/18/21                   Plan - 12/27/20 2110     Clinical Impression Statement Patient is abe to clear bilateral toes better with verbal cueing and "priming" prior to gait for active dorsiflexion during combined hip/knee flexion. She exhibits improving ability to perform reaching and grasping with small items using spherical grip and pincer grasp. Patient does have decreased toe clearance bilaterally (worse on L) that is worsened with fatigue. She demonstrates ongoing improvement in transfers from wheelchair and performance of sit to stand. Patient has remaining deficits in increased tone of wrist/digit  flexors (L>RUE), impaired trunk control, decreased LE strength and motor recruitment, gait deficits/deviations (most notably difficulty with L>R toe clearance during gait), decreased L>R shoulder AROM, and impaired postural stability in standing. Patient will benefit from continued skilled therapeutic intervention to address the above deficits as needed for improved function and QoL.    Personal Factors and Comorbidities Age;Comorbidity 3+;Time since onset of injury/illness/exacerbation    Comorbidities GERD, pre-diabetes, obesity, gout, s/p C3-7 ACDF    Examination-Activity Limitations Bathing;Continence;Reach Overhead;Stairs;Bed Mobility;Dressing;Self Feeding;Stand;Hygiene/Grooming;Toileting;Transfers;Locomotion Level    Examination-Participation Restrictions Community Activity;Yard Work;Laundry;Cleaning;Shop    Stability/Clinical Decision Making Evolving/Moderate complexity    Rehab Potential Fair    PT Frequency Other (comment)   2-3x/week   PT Duration Other (comment)   16 weeks   PT Treatment/Interventions Electrical Stimulation;Gait training;Stair training;Functional mobility training;Therapeutic activities;Therapeutic exercise;Balance training;Neuromuscular re-education;Wheelchair mobility training;Patient/family education    PT Next Visit Plan Gait re-training, task practice for transferring, LE strengthening, UE AROM and strengthening to assist with functional mobility and transfers    PT Home Exercise Plan Pt continuing with home health exercises. Pt continuing with task practice for rolling, scooting, bridging in bed; walking with CGA with FWW; reaching and grasping with LUE.    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, initial encounter [facility])     Problem List Patient Active Problem List   Diagnosis Date Noted   Suspected deep tissue injury 10/25/2020   Wheelchair dependence  11/27/2019   Spasticity 11/27/2019   Hyperkalemia    Steroid-induced hyperglycemia    Prediabetes    Neurogenic bowel    Neurogenic bladder    Neuropathic pain    Quadriplegia ([place]) 08/31/2019   S/P cervical spinal fusion 08/28/2019   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 [place], [place], [address] Phone: [phone]   Fax:  [phone]  Name: Meredith Roach MRN: [id number] Date of Birth: 24-Dec-1940

## 2020-12-30 ENCOUNTER — Ambulatory Visit: Payer: Medicare PPO | Admitting: Physical Therapy

## 2020-12-30 ENCOUNTER — Other Ambulatory Visit: Payer: Self-pay

## 2020-12-30 ENCOUNTER — Encounter: Payer: Self-pay | Admitting: Physical Therapy

## 2020-12-30 DIAGNOSIS — M6281 Muscle weakness (generalized): Secondary | ICD-10-CM

## 2020-12-30 DIAGNOSIS — S14123A 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, initial encounter: Secondary | ICD-10-CM

## 2020-12-30 DIAGNOSIS — R262 Difficulty in walking, not elsewhere classified: Secondary | ICD-10-CM | POA: Diagnosis not present

## 2020-12-30 NOTE — Therapy (Signed)
[facility] [address] [place], [place], [address] Phone: [phone]   Fax:  [phone]  Physical Therapy Treatment  Patient Details  Name: Meredith Roach MRN: [id number] Date of Birth: 23-Jul-1940 Referring Provider (PT): [name], MD   Encounter Date: 12/30/2020   PT End of Session - 12/30/20 1304     Visit Number 13    Number of Visits 81    Date for PT Re-Evaluation 03/18/21    Authorization Time Period Cert 1/75/10-25/85/27, last progress note 12/23/20    Progress Note Due on Visit 20    PT Start Time 1145    PT Stop Time 1230    PT Time Calculation (min) 45 min    Equipment Utilized During Treatment Gait belt   Pt uses power wheelchair, clinic's front-wheeled walker utilized for gait   Activity Tolerance Patient tolerated treatment well    Behavior During Therapy WFL for tasks assessed/performed             Past Medical History:  Diagnosis Date   Arthritis    GERD (gastroesophageal reflux disease)    Gout    Neuromuscular disorder ([place])    Pre-diabetes     Past Surgical History:  Procedure Laterality Date   ANTERIOR CERVICAL DECOMPRESSION/DISCECTOMY FUSION 4 LEVELS N/A 08/28/2019   Procedure: ANTERIOR CERVICAL DECOMPRESSION/DISCECTOMY FUSION CERVICALTHREE-FOUR CERVICAL,FOUR-FIVE,CERVICAL FIVE-SIX,CERVICAL SIX-SEVEN.;  Surgeon: [name], MD;  Location: [place];  Service: Neurosurgery;  Laterality: N/A;  ANTERIOR   BACK SURGERY     CATARACT EXTRACTION W/PHACO Right 03/31/2018   Procedure: CATARACT EXTRACTION PHACO AND INTRAOCULAR LENS PLACEMENT ([place]);  Surgeon: [name], MD;  Location: [facility];  Service: Ophthalmology;  Laterality: Right;  [place] 00:39.7 CDE 4.35 Fluid Pack Lot # [id number]   CHOLECYSTECTOMY  1995   COLONOSCOPY WITH PROPOFOL N/A 06/07/2017   Procedure: COLONOSCOPY WITH PROPOFOL;  Surgeon: [name], MD;  Location[facility] END[facility]COPY;  Service: Endoscopy;  Laterality: N/A;    ESOPHAGOGASTRODUODENOSCOPY (EGD) WITH PROPOFOL N/A 06/07/2017   Procedure: ESOPHAGOGASTRODUODENOSCOPY (EGD) WITH PROPOFOL;  Surgeon: [name], MD;  Location: [facility] ENDOSCOPY;  Service: Endoscopy;  Laterality: N/A;   IR CATHETER TUBE CHANGE  05/10/2020   REDUCTION MAMMAPLASTY Bilateral 1994    There were no vitals filed for this visit.   Subjective Assessment - 12/30/20 1148     Subjective Patient reports she is well today.Tolerated last session well with no soreness. Pt is compliant with established HEP and is continuing gait with FWW in home with SBA from her husband or daughter. She continues to require assist with bed mobility.    Patient is accompained by: Family member   husband   Pertinent History Patient is a 80 year old female s/p incomplete SCI. She had incomplete SCI following fall - C4 [place] D (DOI: 08/26/19). Primary activity limitations c difficulty walking, transferring, and completing independent functional mobility following incomplete SCI. She has undergone home health therapy for about one year. She and her husband reports she was able to transfer and ambulate with walker in home with PT walking beside her. Pt has practiced with front-wheel walker at home. 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 Patient reports using steady transfer aid for performing transfers chair to bed and W/C to standard chair. Her husband states that she is able to stand with steady device and can stand with her legs against the bed. Pt has ramp to get in her home for her power wheelchair. Patient  lives in one level home. Pt uses tub bench that she can utilize to slide into shower; her daughter/husband assists with bathing. Pt has Lucianne Lei that can accommodate her power chair at this time. Pt has concrete driveway to get up to her ramp. Pt has small threshold to get into her home - husband utilizes cardboard to get over threshold.    Limitations Walking;Standing;House hold activities;Other (comment)    transferring, bed mobility, bladder management   Patient Stated Goals to be able to walk more independently    Currently in Pain? No/denies               TREATMENT     Neuromuscular Re-education - for increased motor recruitment of major muscle groups with motor impairment, nervous system priming and exercises to promote trunk stability      Seated pelvic tilt on Dynadisc; alternating hip hike; x25 alternating L/R   Seated forward/backward trunk lean on Dynadisc; x25   Seated march on Dynadisc with alternating UE/LE; x20 alternating     Standing with FWW with mat table behind patient -Unilateral upper extremity reaching and grasping in standing position with single UE support; fine motor grasping and trunk rotation to place items into container on opposite side of body (small clothes pins, small cylinder and cubes from "HAND PT" packet); x 5 min    -Standing hip flexion with visual and tactile cueing for increased range to improve foot clearance during gait; x10 each LE       *not today* Lower limb combined hip/knee flexion and dorsiflexion in unsupported sitting position, tactile cueing for upright posture and to facilitate active dorsiflexion with hip/knee flexion; x25 alternating  -Standing forward kick with active dorsiflexion with verbal cueing prior to movement for "taking foot off of gas pedal"; x20 alternating Seated pelvic tilt on Dynadisc; anterior/posterior x20, L/R x20 -Standing bilateral weight shift in static standing with no UE support; x25 alternating Grasping with fine motor pulp to pulp grasp, grabbing bolts and placing into adjacent container, in standing x 4 min Standing march, with alternating unilateral upper limb support; x20 Seated toe tapping onto Airex pad to promote active dorsiflexion during LE swing; x20 bilateral LE Lower limb combined hip/knee flexion and dorsiflexion with pawing with VMS for improved dorsiflexor activation. 200 pps phase  duration, ramp 1 sec, cycle time 5/5, 32 mA intensity; x 5 minutes each lower extremity Pedal feet, with unilateral upper limb support; x10 with each arm holding single parallel bar         Therapeutic Activities - patient education, repetitive task practice for improved performance of daily functional activities e.g. transferring     Stand pivot transfer to/from low mat with front-wheeled walker with ModA for completion of sit to stand, supervision for pivot, and CGA for stand to sit. IND setup of power wheelchair prior to performance of transfer; x2   Sit to stand from raised low mat; 2x10, with no UE assist  Additional sit to stands throughout session between exercises   Gait with FWW in clinic x 150 feet with 2 laps around gym; CGA/supervision throughout gait (2 circles around gym)   Standing heel/toe raise; 2x15 alternating; standing in FWW with power chair behind patient [mild instability with posterior weight shift during toe raise]             *not today* Standing march with bilateral UE support on walker (attempted R unilateral support with limited ability to fully lift L foot from ground); 2x15 alternating Bed mobility  task practice: performed bilateral rolling with ModA for upper and lower limbs as needed to promote independent bed mobility. MaxA for L arm cross-body adduction to facilitate R rolling. MinA for L rolling.  Bridging; x25, partial range  Seated march on edge of bed for improved lower limb management as needed for car and bed transfers; 20x alternating         ASSESSMENT Patient arrives with excellent motivation to PT session. She is abe to clear bilateral toes during first half of gait training with verbal cueing for increased active dorsiflexion and hip flexion. Although improving, toe clearance bilaterally (worse on L) does continue to challenge patient once she is fatigued. She did require Mod A to lift for initial sit>stand from w/c. STS from elevated  mat table improved throughout session rannging from Min A to close SUP with no UE support. Patient was significantly fatigued at end of session. Patient has remaining deficits in increased tone of wrist/digit flexors (L>RUE), impaired trunk control, decreased LE strength and motor recruitment, gait deficits/deviations (most notably difficulty with L>R toe clearance during gait), decreased L>R shoulder AROM, and impaired postural stability in standing. Patient will benefit from continued skilled therapeutic intervention to address the above deficits as needed for improved function and QoL.              PT Short Term Goals - 12/23/20 1354       PT SHORT TERM GOAL #1   Title Patient will perform stand-pivot transfer from chair to bed with supervision level of assist and ModI setup of power chair as needed for transferring at home    Baseline 11/26/20: Max assist for setup and mod assist to perform sit to stand, unsteady pivot prior to performing stand to sit.    12/23/20: Patient is able to setup power chair for transfer ModI. Mod to Max assist for sit to stand. CGA level of assist for standing pivot and stand to sit on low mat.    Time 4    Period Weeks    Status Partially Met    Target Date 01/09/21      PT SHORT TERM GOAL #2   Title Patient will perform standing toe tap to 3-inch surface (Airex pad) with bilateral upper extremity support indicative of improved ability to clear feet from floor during stepping and improved weight shift to each LE with AD use    Baseline 11/26/20: Limited toe clearance during bilateral LE swing phase with front-wheeled walker.   12/23/20: performed at previous follow-up with bilateral UE support.    Time 3    Period Weeks    Status Achieved    Target Date 12/17/20               PT Long Term Goals - 12/23/20 1312       PT LONG TERM GOAL #1   Title Patient will demonstrate improved function as evidenced by a score of 35 on FOTO measure for full  participation in activities at home and in the community.    Baseline 11/26/20: FOTO 12.   12/23/20: FOTO 24.    Time 16    Period Weeks    Status Partially Met    Target Date 03/18/21      PT LONG TERM GOAL #2   Title Patient will ambulate for 150 feet without LOB, with sufficient toe clearance to prevent loss of gait stability, and c proper AD placement with least-restrictive assistive device with supervision level of assist  Baseline 11/26/20: Gait x 40 feet with CGA.   12/23/20: CGA with dec toe clearance L>RLE.    Time 12    Period Weeks    Status On-going    Target Date 02/19/21      PT LONG TERM GOAL #3   Title Patient will perform independent re-positioning in chair with use of closed-chain triceps on armrests as needed for independent wheelchair mobility with no verbal cueing or tactile cueing required    Baseline 11/26/20: Dependent re-positioning in wheelchair.   12/23/20: Performed with feet on fold-out platform of her power wheelchair IND.    Time 8    Period Weeks    Status Achieved    Target Date 01/22/21      PT LONG TERM GOAL #4   Title Patient will perform simulated step-over task for clearing doorway threshold with use of front-wheeled walker as needed for entering/exiting home with supervision level of assistance and no LOB    Baseline 11/26/20: Pt currently uses power wheelchair to negotiate threshold into home.   12/23/20: Deferred    Time 16    Period Weeks    Status Deferred    Target Date 03/18/21                   Plan - 12/30/20 1325     Clinical Impression Statement Patient arrives with excellent motivation to PT session. She is abe to clear bilateral toes during first half of gait training with verbal cueing for increased active dorsiflexion and hip flexion. Although improving, toe clearance bilaterally (worse on L) does continue to challenge patient once she is fatigued. She did require Mod A to lift for initial sit>stand from w/c. STS from elevated  mat table improved throughout session rannging from Min A to close SUP with no UE support. Patient was significantly fatigued at end of session. Patient has remaining deficits in increased tone of wrist/digit flexors (L>RUE), impaired trunk control, decreased LE strength and motor recruitment, gait deficits/deviations (most notably difficulty with L>R toe clearance during gait), decreased L>R shoulder AROM, and impaired postural stability in standing. Patient will benefit from continued skilled therapeutic intervention to address the above deficits as needed for improved function and QoL.    Personal Factors and Comorbidities Age;Comorbidity 3+;Time since onset of injury/illness/exacerbation    Comorbidities GERD, pre-diabetes, obesity, gout, s/p C3-7 ACDF    Examination-Activity Limitations Bathing;Continence;Reach Overhead;Stairs;Bed Mobility;Dressing;Self Feeding;Stand;Hygiene/Grooming;Toileting;Transfers;Locomotion Level    Examination-Participation Restrictions Community Activity;Yard Work;Laundry;Cleaning;Shop    Stability/Clinical Decision Making Evolving/Moderate complexity    Rehab Potential Fair    PT Frequency Other (comment)   2-3x/week   PT Duration Other (comment)   16 weeks   PT Treatment/Interventions Electrical Stimulation;Gait training;Stair training;Functional mobility training;Therapeutic activities;Therapeutic exercise;Balance training;Neuromuscular re-education;Wheelchair mobility training;Patient/family education    PT Next Visit Plan Gait re-training, task practice for transferring, LE strengthening, UE AROM and strengthening to assist with functional mobility and transfers    PT Home Exercise Plan Pt continuing with home health exercises. Pt continuing with task practice for rolling, scooting, bridging in bed; walking with CGA with FWW; reaching and grasping with LUE.    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, initial encounter [facility])     Problem List Patient Active Problem List   Diagnosis Date Noted   Suspected deep tissue injury 10/25/2020   Wheelchair dependence 11/27/2019   Spasticity 11/27/2019   Hyperkalemia    Steroid-induced hyperglycemia    Prediabetes    Neurogenic bowel    Neurogenic bladder    Neuropathic pain    Quadriplegia ([place]) 08/31/2019   S/P cervical spinal fusion 08/28/2019   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 [place], [place], [address] Phone: [phone]   Fax:  [phone]  Name: TORRANCE FRECH MRN: [id number] Date of Birth: [birth year]

## 2021-01-01 ENCOUNTER — Encounter: Payer: Self-pay | Admitting: Physical Therapy

## 2021-01-01 ENCOUNTER — Other Ambulatory Visit: Payer: Self-pay

## 2021-01-01 ENCOUNTER — Ambulatory Visit: Payer: Medicare PPO | Admitting: Physical Therapy

## 2021-01-01 DIAGNOSIS — R262 Difficulty in walking, not elsewhere classified: Secondary | ICD-10-CM | POA: Diagnosis not present

## 2021-01-01 DIAGNOSIS — M6281 Muscle weakness (generalized): Secondary | ICD-10-CM

## 2021-01-01 DIAGNOSIS — S14123A 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, initial encounter: Secondary | ICD-10-CM

## 2021-01-01 NOTE — Therapy (Signed)
[facility] [address] [place], [place], [address] Phone: [phone]   Fax:  [phone]  Physical Therapy Treatment  Patient Details  Name: Meredith Roach MRN: [id number] Date of Birth: 04-18-1940 Referring Provider (PT): [name], MD   Encounter Date: 01/01/2021   PT End of Session - 01/01/21 1306     Visit Number 14    Number of Visits 56    Date for PT Re-Evaluation 03/18/21    Authorization Time Period Cert 11/05/34-64/40/34, last progress note 12/23/20    Progress Note Due on Visit 20    PT Start Time 1142    PT Stop Time 1227    PT Time Calculation (min) 45 min    Equipment Utilized During Treatment Gait belt   Pt uses power wheelchair, clinic's front-wheeled walker utilized for gait   Activity Tolerance Patient tolerated treatment well    Behavior During Therapy [facility] for tasks assessed/performed             Past Medical History:  Diagnosis Date   Arthritis    GERD (gastroesophageal reflux disease)    Gout    Neuromuscular disorder ([place])    Pre-diabetes     Past Surgical History:  Procedure Laterality Date   ANTERIOR CERVICAL DECOMPRESSION/DISCECTOMY FUSION 4 LEVELS N/A 08/28/2019   Procedure: ANTERIOR CERVICAL DECOMPRESSION/DISCECTOMY FUSION CERVICALTHREE-FOUR CERVICAL,FOUR-FIVE,CERVICAL FIVE-SIX,CERVICAL SIX-SEVEN.;  Surgeon: [name], MD;  Location: [place];  Service: Neurosurgery;  Laterality: N/A;  ANTERIOR   BACK SURGERY     CATARACT EXTRACTION W/PHACO Right 03/31/2018   Procedure: CATARACT EXTRACTION PHACO AND INTRAOCULAR LENS PLACEMENT ([place]);  Surgeon: [name], MD;  Location: [facility];  Service: Ophthalmology;  Laterality: Right;  [place] 00:39.7 CDE 4.35 Fluid Pack Lot # [id number]   CHOLECYSTECTOMY  1995   COLONOSCOPY WITH PROPOFOL N/A 06/07/2017   Procedure: COLONOSCOPY WITH PROPOFOL;  Surgeon: [name], MD;  Location: [facility] ENDOSCOPY;  Service: Endoscopy;  Laterality: N/A;    ESOPHAGOGASTRODUODENOSCOPY (EGD) WITH PROPOFOL N/A 06/07/2017   Procedure: ESOPHAGOGASTRODUODENOSCOPY (EGD) WITH PROPOFOL;  Surgeon: [name], MD;  Location: [facility] ENDOSCOPY;  Service: Endoscopy;  Laterality: N/A;   IR CATHETER TUBE CHANGE  05/10/2020   REDUCTION MAMMAPLASTY Bilateral 1994    There were no vitals filed for this visit.   Subjective Assessment - 01/01/21 1305     Subjective Patient reports she is well today. She is compliant with HEP and is continuing gait with FWW at home with her husband or daughter present. No questions or concerns at this time.    Patient is accompained by: Family member   husband   Pertinent History Patient is a 80 year old female s/p incomplete SCI. She had incomplete SCI following fall - C4 [place] D (DOI: 08/26/19). Primary activity limitations c difficulty walking, transferring, and completing independent functional mobility following incomplete SCI. She has undergone home health therapy for about one year. She and her husband reports she was able to transfer and ambulate with walker in home with PT walking beside her. Pt has practiced with front-wheel walker at home. 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 Patient reports using steady transfer aid for performing transfers chair to bed and W/C to standard chair. Her husband states that she is able to stand with steady device and can stand with her legs against the bed. Pt has ramp to get in her home for her power wheelchair. Patient lives in one level home. Pt uses tub bench  that she can utilize to slide into shower; her daughter/husband assists with bathing. Pt has Lucianne Lei that can accommodate her power chair at this time. Pt has concrete driveway to get up to her ramp. Pt has small threshold to get into her home - husband utilizes cardboard to get over threshold.    Limitations Walking;Standing;House hold activities;Other (comment)   transferring, bed mobility, bladder management   Patient Stated  Goals to be able to walk more independently    Currently in Pain? No/denies                TREATMENT     Neuromuscular Re-education - for increased motor recruitment of major muscle groups with motor impairment, nervous system priming and exercises to promote trunk stability      Scapular protraction and retraction in side lying, BUE; 2x10, assist for improved scapular mobility first set, resistance with retraction second set.  Clamshells in side lying, no resistance, BLE; x20         *not today* Lower limb combined hip/knee flexion and dorsiflexion in unsupported sitting position, tactile cueing for upright posture and to facilitate active dorsiflexion with hip/knee flexion; x25 alternating  -Standing forward kick with active dorsiflexion with verbal cueing prior to movement for "taking foot off of gas pedal"; x20 alternating Seated pelvic tilt on Dynadisc; anterior/posterior x20, L/R x20 -Standing bilateral weight shift in static standing with no UE support; x25 alternating Grasping with fine motor pulp to pulp grasp, grabbing bolts and placing into adjacent container, in standing x 4 min Standing march, with alternating unilateral upper limb support; x20 Seated toe tapping onto Airex pad to promote active dorsiflexion during LE swing; x20 bilateral LE Lower limb combined hip/knee flexion and dorsiflexion with pawing with VMS for improved dorsiflexor activation. 200 pps phase duration, ramp 1 sec, cycle time 5/5, 32 mA intensity; x 5 minutes each lower extremity Pedal feet, with unilateral upper limb support; x10 with each arm holding single parallel bar Seated pelvic tilt on Dynadisc; alternating hip hike; x25 alternating L/R Seated forward/backward trunk lean on Dynadisc; x25 Seated march on Dynadisc with alternating UE/LE; x20 alternating Standing with FWW with mat table behind patient -Unilateral upper extremity reaching and grasping in standing position with single UE  support; fine motor grasping and trunk rotation to place items into container on opposite side of body (small clothes pins, small cylinder and cubes from "HAND PT" packet); x 5 min  -Standing hip flexion with visual and tactile cueing for increased range to improve foot clearance during gait; x10 each LE       Therapeutic Activities - patient education, repetitive task practice for improved performance of daily functional activities e.g. transferring     Stand pivot transfer to/from low mat with front-wheeled walker with ModA for completion of sit to stand, supervision for pivot, and CGA for stand to sit. IND setup of power wheelchair prior to performance of transfer; x2    Bed mobility:  Sit<>supine x 2. First rep - pt demo typical technique and sequencing she and husband use at home. Second rep - pt encouraged continuation of sit>supine technique; PT taught pt to roll into side lying and to push through left elbow and hand to achieve supine>sit. PT educated on where/how husband can assist with maneuver.   Rolling: 1 roll in bilateral directions with VC on sequencing. Pt demo difficulty initiating the roll. She is able to maintain side lying once in position.  Lateral propping: Sitting EOM, performed prior to  second rep of sit<>supine. 8 reps in each direction with patient placing elbow on mat table to achieve lateral lean (PT guided elbow down to table for positioning) and pt pushing through elbow and hand ,to achieve upright. Increased challenge/fatigue with LUE.    Gait with FWW in clinic x 80 feet around gym; CGA/supervision throughout gait                 *not today* Standing march with bilateral UE support on walker (attempted R unilateral support with limited ability to fully lift L foot from ground); 2x15 alternating Bed mobility task practice: performed bilateral rolling with ModA for upper and lower limbs as needed to promote independent bed mobility. MaxA for L arm  cross-body adduction to facilitate R rolling. MinA for L rolling.  Bridging; x25, partial range  Seated march on edge of bed for improved lower limb management as needed for car and bed transfers; 20x alternating         ASSESSMENT Patient arrives with excellent motivation to PT session. After mentioning she continues to require assistance with bed mobility at home, PT focused interventions and education towards rolling and supine<>sit maneuvers. PT observed how pt and husband perform at home and made adjustments to supine>sit technique. Pt reported feeling like she was able to participate using this technique more than the current technique being used (husband pulling on pt BUE to perform forward trunk flexion). PT educated on safety concerns regarding pulling on weak limbs and husband's body mechanics. Bilateral rolling continues to be a challenge due to decreased force and momentum with contralateral reaching and to initiate roll. Ambulation distance limited due to patient's fatigue at end of session after bed mobility training. Patient has remaining deficits in increased tone of wrist/digit flexors (L>RUE), impaired trunk control, decreased LE strength and motor recruitment, gait deficits/deviations (most notably difficulty with L>R toe clearance during gait), decreased L>R shoulder AROM, and impaired postural stability in standing. Patient will benefit from continued skilled therapeutic intervention to address the above deficits as needed for improved function and QoL.              PT Short Term Goals - 12/23/20 1354       PT SHORT TERM GOAL #1   Title Patient will perform stand-pivot transfer from chair to bed with supervision level of assist and ModI setup of power chair as needed for transferring at home    Baseline 11/26/20: Max assist for setup and mod assist to perform sit to stand, unsteady pivot prior to performing stand to sit.    12/23/20: Patient is able to setup power chair  for transfer ModI. Mod to Max assist for sit to stand. CGA level of assist for standing pivot and stand to sit on low mat.    Time 4    Period Weeks    Status Partially Met    Target Date 01/09/21      PT SHORT TERM GOAL #2   Title Patient will perform standing toe tap to 3-inch surface (Airex pad) with bilateral upper extremity support indicative of improved ability to clear feet from floor during stepping and improved weight shift to each LE with AD use    Baseline 11/26/20: Limited toe clearance during bilateral LE swing phase with front-wheeled walker.   12/23/20: performed at previous follow-up with bilateral UE support.    Time 3    Period Weeks    Status Achieved    Target Date 12/17/20  PT Long Term Goals - 12/23/20 1312       PT LONG TERM GOAL #1   Title Patient will demonstrate improved function as evidenced by a score of 35 on FOTO measure for full participation in activities at home and in the community.    Baseline 11/26/20: FOTO 12.   12/23/20: FOTO 24.    Time 16    Period Weeks    Status Partially Met    Target Date 03/18/21      PT LONG TERM GOAL #2   Title Patient will ambulate for 150 feet without LOB, with sufficient toe clearance to prevent loss of gait stability, and c proper AD placement with least-restrictive assistive device with supervision level of assist    Baseline 11/26/20: Gait x 40 feet with CGA.   12/23/20: CGA with dec toe clearance L>RLE.    Time 12    Period Weeks    Status On-going    Target Date 02/19/21      PT LONG TERM GOAL #3   Title Patient will perform independent re-positioning in chair with use of closed-chain triceps on armrests as needed for independent wheelchair mobility with no verbal cueing or tactile cueing required    Baseline 11/26/20: Dependent re-positioning in wheelchair.   12/23/20: Performed with feet on fold-out platform of her power wheelchair IND.    Time 8    Period Weeks    Status Achieved    Target  Date 01/22/21      PT LONG TERM GOAL #4   Title Patient will perform simulated step-over task for clearing doorway threshold with use of front-wheeled walker as needed for entering/exiting home with supervision level of assistance and no LOB    Baseline 11/26/20: Pt currently uses power wheelchair to negotiate threshold into home.   12/23/20: Deferred    Time 16    Period Weeks    Status Deferred    Target Date 03/18/21                   Plan - 01/01/21 1329     Clinical Impression Statement Patient arrives with excellent motivation to PT session. After mentioning she continues to require assistance with bed mobility at home, PT focused interventions and education towards rolling and supine<>sit maneuvers. PT observed how pt and husband perform at home and made adjustments to supine>sit technique. Pt reported feeling like she was able to participate using this technique more than the current technique being used (husband pulling on pt BUE to perform forward trunk flexion). PT educated on safety concerns regarding pulling on weak limbs and husband's body mechanics. Bilateral rolling continues to be a challenge due to decreased force and momentum with contralateral reaching and to initiate roll. Ambulation distance limited due to patient's fatigue at end of session after bed mobility training. Patient has remaining deficits in increased tone of wrist/digit flexors (L>RUE), impaired trunk control, decreased LE strength and motor recruitment, gait deficits/deviations (most notably difficulty with L>R toe clearance during gait), decreased L>R shoulder AROM, and impaired postural stability in standing. Patient will benefit from continued skilled therapeutic intervention to address the above deficits as needed for improved function and QoL.    Personal Factors and Comorbidities Age;Comorbidity 3+;Time since onset of injury/illness/exacerbation    Comorbidities GERD, pre-diabetes, obesity, gout, s/p  C3-7 ACDF    Examination-Activity Limitations Bathing;Continence;Reach Overhead;Stairs;Bed Mobility;Dressing;Self Feeding;Stand;Hygiene/Grooming;Toileting;Transfers;Locomotion Level    Examination-Participation Restrictions Community Activity;Yard Work;Laundry;Cleaning;Shop    Stability/Clinical Decision Making Evolving/Moderate complexity  Rehab Potential Fair    PT Frequency Other (comment)   2-3x/week   PT Duration Other (comment)   16 weeks   PT Treatment/Interventions Electrical Stimulation;Gait training;Stair training;Functional mobility training;Therapeutic activities;Therapeutic exercise;Balance training;Neuromuscular re-education;Wheelchair mobility training;Patient/family education    PT Next Visit Plan Gait re-training, task practice for transferring, LE strengthening, UE AROM and strengthening to assist with functional mobility and transfers    PT Home Exercise Plan Pt continuing with home health exercises. Pt continuing with task practice for rolling, scooting, bridging in bed; walking with CGA with FWW; reaching and grasping with LUE.    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, initial encounter [facility])     Problem List Patient Active Problem List   Diagnosis Date Noted   Suspected deep tissue injury 10/25/2020   Wheelchair dependence 11/27/2019   Spasticity 11/27/2019   Hyperkalemia    Steroid-induced hyperglycemia    Prediabetes    Neurogenic bowel    Neurogenic bladder    Neuropathic pain    Quadriplegia ([place])  08/31/2019   S/P cervical spinal fusion 08/28/2019   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 [address] [place], [place], [address] Phone: [phone]   Fax:  [phone]  Name: Meredith Roach MRN: [id number] Date of Birth: 16-Dec-1940

## 2021-01-02 ENCOUNTER — Ambulatory Visit: Payer: Medicare PPO

## 2021-01-02 DIAGNOSIS — S14123A 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, initial encounter: Secondary | ICD-10-CM

## 2021-01-02 DIAGNOSIS — M6281 Muscle weakness (generalized): Secondary | ICD-10-CM

## 2021-01-02 DIAGNOSIS — R262 Difficulty in walking, not elsewhere classified: Secondary | ICD-10-CM

## 2021-01-02 NOTE — Therapy (Signed)
[facility] [address] [place], [place], [address] Phone: [phone]   Fax:  [phone]  Physical Therapy Treatment  Patient Details  Name: Meredith Roach MRN: [id number] Date of Birth: [birth year] Referring Provider (PT): [name], MD   Encounter Date: 01/02/2021   PT End of Session - 01/02/21 1743     Visit Number 15    Number of Visits 59    Date for PT Re-Evaluation 03/18/21    Authorization Time Period Cert 6/96/29-52/84/13, last progress note 12/23/20    Progress Note Due on Visit 20    PT Start Time 1345    PT Stop Time 1430    PT Time Calculation (min) 45 min    Equipment Utilized During Treatment Gait belt   Pt uses power wheelchair, clinic's front-wheeled walker utilized for gait   Activity Tolerance Patient tolerated treatment well    Behavior During Therapy [facility] for tasks assessed/performed             Past Medical History:  Diagnosis Date   Arthritis    GERD (gastroesophageal reflux disease)    Gout    Neuromuscular disorder ([place])    Pre-diabetes     Past Surgical History:  Procedure Laterality Date   ANTERIOR CERVICAL DECOMPRESSION/DISCECTOMY FUSION 4 LEVELS N/A 08/28/2019   Procedure: ANTERIOR CERVICAL DECOMPRESSION/DISCECTOMY FUSION CERVICALTHREE-FOUR CERVICAL,FOUR-FIVE,CERVICAL FIVE-SIX,CERVICAL SIX-SEVEN.;  Surgeon: [name], MD;  Location: [facility];  Service: Neurosurgery;  Laterality: N/A;  ANTERIOR   BACK SURGERY     CATARACT EXTRACTION W/PHACO Right 03/31/2018   Procedure: CATARACT EXTRACTION PHACO AND INTRAOCULAR LENS PLACEMENT ([place]);  Surgeon: [name], MD;  Location: [facility] ORS;  Service: Ophthalmology;  Laterality: Right;  [place] 00:39.7 CDE 4.35 Fluid Pack Lot # [id number]   CHOLECYSTECTOMY  1995   COLONOSCOPY WITH PROPOFOL N/A 06/07/2017   Procedure: COLONOSCOPY WITH PROPOFOL;  Surgeon: [name], MD;  Location: [facility] ENDOSCOPY;  Service: Endoscopy;  Laterality: N/A;    ESOPHAGOGASTRODUODENOSCOPY (EGD) WITH PROPOFOL N/A 06/07/2017   Procedure: ESOPHAGOGASTRODUODENOSCOPY (EGD) WITH PROPOFOL;  Surgeon: [name], MD;  Location[facility];  Service: Endoscopy;  Laterality: N/A;   IR CATHETER TUBE CHANGE  05/10/2020   REDUCTION MAMMAPLASTY Bilateral 1994    There were no vitals filed for this visit.   Subjective Assessment - 01/02/21 1742     Subjective Pt repors she and her husband practiced bed mobility/transfer sidelying to sit with the techniques learned in PT at last session.  She continues using her FWW to amb short distances in her home.    Patient is accompained by: Family member   husband   Pertinent History Patient is a 80 year old female s/p incomplete SCI. She had incomplete SCI following fall - C4 [place] D (DOI: 08/26/19). Primary activity limitations c difficulty walking, transferring, and completing independent functional mobility following incomplete SCI. She has undergone home health therapy for about one year. She and her husband reports she was able to transfer and ambulate with walker in home with PT walking beside her. Pt has practiced with front-wheel walker at home. 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 Patient reports using steady transfer aid for performing transfers chair to bed and W/C to standard chair. Her husband states that she is able to stand with steady device and can stand with her legs against the bed. Pt has ramp to get in her home for her power wheelchair. Patient lives in one level home. Pt uses  tub bench that she can utilize to slide into shower; her daughter/husband assists with bathing. Pt has Lucianne Lei that can accommodate her power chair at this time. Pt has concrete driveway to get up to her ramp. Pt has small threshold to get into her home - husband utilizes cardboard to get over threshold.    Limitations Walking;Standing;House hold activities;Other (comment)   transferring, bed mobility, bladder management    Patient Stated Goals to be able to walk more independently               TREATMENT     Neuromuscular Re-education - for increased motor recruitment of major muscle groups with motor impairment, nervous system priming and exercises to promote trunk stability   Lower limb combined hip/knee flexion and dorsiflexion in unsupported sitting position, tactile cueing for upright posture and to facilitate active dorsiflexion with hip/knee flexion; x25 alternating  Seated pelvic tilt on Dynadisc; alternating hip hike; x25 alternating L/R Seated forward/backward trunk lean on Dynadisc; x25 Seated march on Dynadisc with alternating UE/LE; x20 alternating Grasping L UE with fine motor pulp to pulp grasp, grabbing bolts and placing into adjacent container requiring trunk lean to L (in sitting on dynadisc)x 4 min Bridging; x25, partial range         *not today* Lower limb combined hip/knee flexion and dorsiflexion in unsupported sitting position, tactile cueing for upright posture and to facilitate active dorsiflexion with hip/knee flexion; x25 alternating  -Standing forward kick with active dorsiflexion with verbal cueing prior to movement for "taking foot off of gas pedal"; x20 alternating Clamshells in side lying, no resistance, BLE; x20  -Standing bilateral weight shift in static standing with no UE support; x25 alternating  Standing march, with alternating unilateral upper limb support; x20 Seated toe tapping onto Airex pad to promote active dorsiflexion during LE swing; x20 bilateral LE Lower limb combined hip/knee flexion and dorsiflexion with pawing with VMS for improved dorsiflexor activation. 200 pps phase duration, ramp 1 sec, cycle time 5/5, 32 mA intensity; x 5 minutes each lower extremity Pedal feet, with unilateral upper limb support; x10 with each arm holding single parallel bar  Standing with FWW with mat table behind patient -Unilateral upper extremity reaching and grasping in  standing position with single UE support; fine motor grasping and trunk rotation to place items into container on opposite side of body (small clothes pins, small cylinder and cubes from "HAND PT" packet); x 5 min  -Standing hip flexion with visual and tactile cueing for increased range to improve foot clearance during gait; x10 each LE       Therapeutic Activities - patient education, repetitive task practice for improved performance of daily functional activities e.g. transferring     Stand pivot transfer to/from low mat with front-wheeled walker with ModA for completion of sit to stand, supervision for pivot, and CGA for stand to sit. IND setup of power wheelchair prior to performance of transfer; x2   Bed mobility: Pt's husband observed during part of this session.   Sit<>supine x 2 (via sidelying L, rolling onto back), pt requires mod A for trunk during sidelying to sit   Rolling: 3 roll in bilateral directions with VC on sequencing. Pt demo difficulty initiating the roll. She is able to maintain side lying once in position.      Gait with FWW in clinic x 80 feet around gym; CGA/supervision throughout gait       Seated march on edge of bed for improved lower limb  management as needed for car and bed transfers; 20x alternating             *not today* Standing march with bilateral UE support on walker (attempted R unilateral support with limited ability to fully lift L foot from ground); 2x15 alternating Bed mobility task practice: performed bilateral rolling with ModA for upper and lower limbs as needed to promote independent bed mobility. MaxA for L arm cross-body adduction to facilitate R rolling. MinA for L rolling.          ASSESSMENT See clinical impression statement            PT Short Term Goals - 12/23/20 1354       PT SHORT TERM GOAL #1   Title Patient will perform stand-pivot transfer from chair to bed with supervision level of assist and ModI setup of  power chair as needed for transferring at home    Baseline 11/26/20: Max assist for setup and mod assist to perform sit to stand, unsteady pivot prior to performing stand to sit.    12/23/20: Patient is able to setup power chair for transfer ModI. Mod to Max assist for sit to stand. CGA level of assist for standing pivot and stand to sit on low mat.    Time 4    Period Weeks    Status Partially Met    Target Date 01/09/21      PT SHORT TERM GOAL #2   Title Patient will perform standing toe tap to 3-inch surface (Airex pad) with bilateral upper extremity support indicative of improved ability to clear feet from floor during stepping and improved weight shift to each LE with AD use    Baseline 11/26/20: Limited toe clearance during bilateral LE swing phase with front-wheeled walker.   12/23/20: performed at previous follow-up with bilateral UE support.    Time 3    Period Weeks    Status Achieved    Target Date 12/17/20               PT Long Term Goals - 12/23/20 1312       PT LONG TERM GOAL #1   Title Patient will demonstrate improved function as evidenced by a score of 35 on FOTO measure for full participation in activities at home and in the community.    Baseline 11/26/20: FOTO 12.   12/23/20: FOTO 24.    Time 16    Period Weeks    Status Partially Met    Target Date 03/18/21      PT LONG TERM GOAL #2   Title Patient will ambulate for 150 feet without LOB, with sufficient toe clearance to prevent loss of gait stability, and c proper AD placement with least-restrictive assistive device with supervision level of assist    Baseline 11/26/20: Gait x 40 feet with CGA.   12/23/20: CGA with dec toe clearance L>RLE.    Time 12    Period Weeks    Status On-going    Target Date 02/19/21      PT LONG TERM GOAL #3   Title Patient will perform independent re-positioning in chair with use of closed-chain triceps on armrests as needed for independent wheelchair mobility with no verbal cueing or  tactile cueing required    Baseline 11/26/20: Dependent re-positioning in wheelchair.   12/23/20: Performed with feet on fold-out platform of her power wheelchair IND.    Time 8    Period Weeks    Status Achieved  Target Date 01/22/21      PT LONG TERM GOAL #4   Title Patient will perform simulated step-over task for clearing doorway threshold with use of front-wheeled walker as needed for entering/exiting home with supervision level of assistance and no LOB    Baseline 11/26/20: Pt currently uses power wheelchair to negotiate threshold into home.   12/23/20: Deferred    Time 16    Period Weeks    Status Deferred    Target Date 03/18/21                   Plan - 01/02/21 1745     Clinical Impression Statement PT reviewed and practiced rolling and supine<>sit maneuvers.  She does still reqire mod A for sidelying to sit portion of bed mobility.  Bilateral rolling continues to be a challenge due to decreased force and momentum with contralateral reaching and to initiate roll.  Ambulation distance limited due to patient's fatigue at end of session after bed mobility training. Patient has remaining deficits in increased tone of wrist/digit flexors (L>RUE), impaired trunk control, decreased LE strength and motor recruitment, gait deficits/deviations (most notably difficulty with L>R toe clearance during gait), decreased L>R shoulder AROM, and impaired postural stability in standing. Patient will benefit from continued skilled therapeutic intervention to address the above deficits as needed for improved function and QoL.    Personal Factors and Comorbidities Age;Comorbidity 3+;Time since onset of injury/illness/exacerbation    Comorbidities GERD, pre-diabetes, obesity, gout, s/p C3-7 ACDF    Examination-Activity Limitations Bathing;Continence;Reach Overhead;Stairs;Bed Mobility;Dressing;Self Feeding;Stand;Hygiene/Grooming;Toileting;Transfers;Locomotion Level    Examination-Participation  Restrictions Community Activity;Yard Work;Laundry;Cleaning;Shop    Stability/Clinical Decision Making Evolving/Moderate complexity    Rehab Potential Fair    PT Frequency Other (comment)   2-3x/week   PT Duration Other (comment)   16 weeks   PT Treatment/Interventions Electrical Stimulation;Gait training;Stair training;Functional mobility training;Therapeutic activities;Therapeutic exercise;Balance training;Neuromuscular re-education;Wheelchair mobility training;Patient/family education    PT Next Visit Plan Gait re-training, task practice for transferring, LE strengthening, UE AROM and strengthening to assist with functional mobility and transfers    PT Home Exercise Plan Pt continuing with home health exercises. Pt continuing with task practice for rolling, scooting, bridging in bed; walking with CGA with FWW; reaching and grasping with LUE.    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, initial encounter [facility])     Problem List Patient Active Problem List   Diagnosis Date Noted   Suspected deep tissue injury 10/25/2020   Wheelchair dependence 11/27/2019   Spasticity 11/27/2019   Hyperkalemia    Steroid-induced hyperglycemia    Prediabetes    Neurogenic bowel    Neurogenic bladder    Neuropathic pain    Quadriplegia ([place]) 08/31/2019   S/P cervical spinal fusion 08/28/2019   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 [place], [place], [address] Phone: [phone]   Fax:  [phone]  Name: Meredith Roach MRN: [id number] Date of Birth: [birth year]

## 2021-01-06 ENCOUNTER — Other Ambulatory Visit: Payer: Self-pay

## 2021-01-06 ENCOUNTER — Encounter: Payer: Self-pay | Admitting: Physical Therapy

## 2021-01-06 ENCOUNTER — Ambulatory Visit: Payer: Medicare PPO | Admitting: Physical Therapy

## 2021-01-06 DIAGNOSIS — R262 Difficulty in walking, not elsewhere classified: Secondary | ICD-10-CM | POA: Diagnosis not present

## 2021-01-06 DIAGNOSIS — M6281 Muscle weakness (generalized): Secondary | ICD-10-CM

## 2021-01-06 DIAGNOSIS — S14123A 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, initial encounter: Secondary | ICD-10-CM

## 2021-01-06 NOTE — Therapy (Signed)
[facility] [address] [place], [place], [address] Phone: [phone]   Fax:  [phone]  Physical Therapy Treatment  Patient Details  Name: Meredith Roach MRN: [id number] Date of Birth: 1940/06/23 Referring Provider (PT): [name], MD   Encounter Date: 01/06/2021   PT End of Session - 01/06/21 1323     Visit Number 16    Number of Visits 69    Date for PT Re-Evaluation 03/18/21    Authorization Time Period Cert 3/76/28-31/51/76, last progress note 12/23/20    Progress Note Due on Visit 20    PT Start Time 1140    PT Stop Time 1230    PT Time Calculation (min) 50 min    Equipment Utilized During Treatment Gait belt   Pt uses power wheelchair, clinic's front-wheeled walker utilized for gait   Activity Tolerance Patient tolerated treatment well    Behavior During Therapy [facility] for tasks assessed/performed             Past Medical History:  Diagnosis Date   Arthritis    GERD (gastroesophageal reflux disease)    Gout    Neuromuscular disorder ([place])    Pre-diabetes     Past Surgical History:  Procedure Laterality Date   ANTERIOR CERVICAL DECOMPRESSION/DISCECTOMY FUSION 4 LEVELS N/A 08/28/2019   Procedure: ANTERIOR CERVICAL DECOMPRESSION/DISCECTOMY FUSION CERVICALTHREE-FOUR CERVICAL,FOUR-FIVE,CERVICAL FIVE-SIX,CERVICAL SIX-SEVEN.;  Surgeon: [name], MD;  Location: [place];  Service: Neurosurgery;  Laterality: N/A;  ANTERIOR   BACK SURGERY     CATARACT EXTRACTION W/PHACO Right 03/31/2018   Procedure: CATARACT EXTRACTION PHACO AND INTRAOCULAR LENS PLACEMENT ([place]);  Surgeon: [name], MD;  Location: [facility];  Service: Ophthalmology;  Laterality: Right;  [place] 00:39.7 CDE 4.35 Fluid Pack Lot # [id number]   CHOLECYSTECTOMY  1995   COLONOSCOPY WITH PROPOFOL N/A 06/07/2017   Procedure: COLONOSCOPY WITH PROPOFOL;  Surgeon: [name], MD;  Location[facility] END[facility]COPY;  Service: Endoscopy;  Laterality: N/A;    ESOPHAGOGASTRODUODENOSCOPY (EGD) WITH PROPOFOL N/A 06/07/2017   Procedure: ESOPHAGOGASTRODUODENOSCOPY (EGD) WITH PROPOFOL;  Surgeon: [name], MD;  [facility];  Service: Endoscopy;  Laterality: N/A;   IR CATHETER TUBE CHANGE  05/10/2020   REDUCTION MAMMAPLASTY Bilateral 1994    There were no vitals filed for this visit.   Subjective Assessment - 01/06/21 1322     Subjective She reports continued work on gait with front-wheel walker and with performing sit to stand at home with her husband closely supervising. Patient utilizes AD placed against furniture and performs exercises within confines of walker. She reports notable L lower limb tightness/stiffness. She did have history of knee OA for which she sought out treatment through [facility] prior to her SCI.    Patient is accompained by: Family member   husband   Pertinent History Patient is a 80 year old female s/p incomplete SCI. She had incomplete SCI following fall - C4 [place] D (DOI: 08/26/19). Primary activity limitations c difficulty walking, transferring, and completing independent functional mobility following incomplete SCI. She has undergone home health therapy for about one year. She and her husband reports she was able to transfer and ambulate with walker in home with PT walking beside her. Pt has practiced with front-wheel walker at home. 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 Patient reports using steady transfer aid for performing transfers chair to bed and W/C to standard chair. Her husband states that she is able to stand with steady device and can  stand with her legs against the bed. Pt has ramp to get in her home for her power wheelchair. Patient lives in one level home. Pt uses tub bench that she can utilize to slide into shower; her daughter/husband assists with bathing. Pt has Lucianne Lei that can accommodate her power chair at this time. Pt has concrete driveway to get up to her ramp. Pt has small threshold  to get into her home - husband utilizes cardboard to get over threshold.    Limitations Walking;Standing;House hold activities;Other (comment)   transferring, bed mobility, bladder management   Patient Stated Goals to be able to walk more independently                 TREATMENT     Neuromuscular Re-education - for increased motor recruitment of major muscle groups with motor impairment, nervous system priming and exercises to promote trunk stability   Seated march on Dynadisc with alternating UE/LE; x20 alternating  Bridging; x25, partial range   Sitting edge of low mat, cross-body "boxing" with therapist's hand as target ("cross" punch) to facilitate active cross body adduction and trunk rotation as needed for rolling and reaching/grasping tasks; x 25 alternating   Standing with FWW with mat table behind patient -Unilateral upper extremity reaching and grasping in standing position with single UE support; fine motor grasping and trunk rotation to place items into container on opposite side of body (small clothes pins, small cylinder and cubes from "HAND PT" packet); x 5 min            *not today* Seated forward/backward trunk lean on Dynadisc; x25 Lower limb combined hip/knee flexion and dorsiflexion in unsupported sitting position, tactile cueing for upright posture and to facilitate active dorsiflexion with hip/knee flexion; x25 alternating  Seated pelvic tilt on Dynadisc; alternating hip hike; x25 alternating L/R Grasping L UE with fine motor pulp to pulp grasp, grabbing bolts and placing into adjacent container requiring trunk lean to L (in sitting on dynadisc)x 4 min Lower limb combined hip/knee flexion and dorsiflexion in unsupported sitting position, tactile cueing for upright posture and to facilitate active dorsiflexion with hip/knee flexion; x25 alternating  -Standing forward kick with active dorsiflexion with verbal cueing prior to movement for "taking foot off of  gas pedal"; x20 alternating Clamshells in side lying, no resistance, BLE; x20  -Standing bilateral weight shift in static standing with no UE support; x25 alternating   Standing march, with alternating unilateral upper limb support; x20 Seated toe tapping onto Airex pad to promote active dorsiflexion during LE swing; x20 bilateral LE Lower limb combined hip/knee flexion and dorsiflexion with pawing with VMS for improved dorsiflexor activation. 200 pps phase duration, ramp 1 sec, cycle time 5/5, 32 mA intensity; x 5 minutes each lower extremity Pedal feet, with unilateral upper limb support; x10 with each arm holding single parallel bar   Standing with FWW with mat table behind patient -Standing hip flexion with visual and tactile cueing for increased range to improve foot clearance during gait; x10 each LE        Therapeutic Activities - patient education, repetitive task practice for improved performance of daily functional activities e.g. transferring     Stand pivot transfer to/from low mat with front-wheeled walker with ModA for completion of sit to stand, supervision for pivot, and CGA for stand to sit. IND setup of power wheelchair prior to performance of transfer; x2   Sit to stand from raised table; 1x10, no UE assist [use of posterior  thigh to facilitate end of sit to stand sequence following fatigue]    Bed Mobility Task Practice:   Sit<>supine x 2 (via sidelying L, rolling onto back), pt requires mod A for trunk during sidelying to sit   Rolling: 3 rolls in bilateral directions with VC on sequencing. Difficulty initiating roll to R with limited LUE cross-body adduction AROM; ModA for roll to L    Gait with FWW in clinic x 150 feet around gym; CGA/supervision throughout gait          *not today* Seated march on edge of bed for improved lower limb management as needed for car and bed transfers; 20x alternating Standing march with bilateral UE support on walker (attempted  R unilateral support with limited ability to fully lift L foot from ground); 2x15 alternating Bed mobility task practice: performed bilateral rolling with ModA for upper and lower limbs as needed to promote independent bed mobility. MaxA for L arm cross-body adduction to facilitate R rolling. MinA for L rolling.             ASSESSMENT Patient demonstrates improved independence with L rolling (supine to L sidelying), but R rolling is still limited secondary to limited L shoulder active elevation and horizontal adduction. Continued task practice for bed mobility and added active cross-body adduction work in sitting to facilitate improved ability to complete rolling to R with LUE cross-body adduction. Pt demonstrates improving toe clearance bilaterally (more difficulty on L), but this does require considerable effort at this time. Patient has remaining deficits in increased tone of wrist/digit flexors (L>RUE), impaired trunk control, decreased LE strength and motor recruitment, gait deficits/deviations (most notably difficulty with L>R toe clearance during gait), decreased L>R shoulder AROM, and impaired postural stability in standing. Patient will benefit from continued skilled therapeutic intervention to address the above deficits as needed for improved function and QoL.        PT Short Term Goals - 12/23/20 1354       PT SHORT TERM GOAL #1   Title Patient will perform stand-pivot transfer from chair to bed with supervision level of assist and ModI setup of power chair as needed for transferring at home    Baseline 11/26/20: Max assist for setup and mod assist to perform sit to stand, unsteady pivot prior to performing stand to sit.    12/23/20: Patient is able to setup power chair for transfer ModI. Mod to Max assist for sit to stand. CGA level of assist for standing pivot and stand to sit on low mat.    Time 4    Period Weeks    Status Partially Met    Target Date 01/09/21      PT SHORT TERM  GOAL #2   Title Patient will perform standing toe tap to 3-inch surface (Airex pad) with bilateral upper extremity support indicative of improved ability to clear feet from floor during stepping and improved weight shift to each LE with AD use    Baseline 11/26/20: Limited toe clearance during bilateral LE swing phase with front-wheeled walker.   12/23/20: performed at previous follow-up with bilateral UE support.    Time 3    Period Weeks    Status Achieved    Target Date 12/17/20               PT Long Term Goals - 12/23/20 1312       PT LONG TERM GOAL #1   Title Patient will demonstrate improved function as evidenced by  a score of 35 on FOTO measure for full participation in activities at home and in the community.    Baseline 11/26/20: FOTO 12.   12/23/20: FOTO 24.    Time 16    Period Weeks    Status Partially Met    Target Date 03/18/21      PT LONG TERM GOAL #2   Title Patient will ambulate for 150 feet without LOB, with sufficient toe clearance to prevent loss of gait stability, and c proper AD placement with least-restrictive assistive device with supervision level of assist    Baseline 11/26/20: Gait x 40 feet with CGA.   12/23/20: CGA with dec toe clearance L>RLE.    Time 12    Period Weeks    Status On-going    Target Date 02/19/21      PT LONG TERM GOAL #3   Title Patient will perform independent re-positioning in chair with use of closed-chain triceps on armrests as needed for independent wheelchair mobility with no verbal cueing or tactile cueing required    Baseline 11/26/20: Dependent re-positioning in wheelchair.   12/23/20: Performed with feet on fold-out platform of her power wheelchair IND.    Time 8    Period Weeks    Status Achieved    Target Date 01/22/21      PT LONG TERM GOAL #4   Title Patient will perform simulated step-over task for clearing doorway threshold with use of front-wheeled walker as needed for entering/exiting home with supervision level of  assistance and no LOB    Baseline 11/26/20: Pt currently uses power wheelchair to negotiate threshold into home.   12/23/20: Deferred    Time 16    Period Weeks    Status Deferred    Target Date 03/18/21                   Plan - 01/07/21 0076     Clinical Impression Statement Patient demonstrates improved independence with L rolling (supine to L sidelying), but R rolling is still limited secondary to limited L shoulder active elevation and horizontal adduction. Continued task practice for bed mobility and added active cross-body adduction work in sitting to facilitate improved ability to complete rolling to R with LUE cross-body adduction. Pt demonstrates improving toe clearance bilaterally (more difficulty on L), but this does require considerable effort at this time. Patient has remaining deficits in increased tone of wrist/digit flexors (L>RUE), impaired trunk control, decreased LE strength and motor recruitment, gait deficits/deviations (most notably difficulty with L>R toe clearance during gait), decreased L>R shoulder AROM, and impaired postural stability in standing. Patient will benefit from continued skilled therapeutic intervention to address the above deficits as needed for improved function and QoL.    Personal Factors and Comorbidities Age;Comorbidity 3+;Time since onset of injury/illness/exacerbation    Comorbidities GERD, pre-diabetes, obesity, gout, s/p C3-7 ACDF    Examination-Activity Limitations Bathing;Continence;Reach Overhead;Stairs;Bed Mobility;Dressing;Self Feeding;Stand;Hygiene/Grooming;Toileting;Transfers;Locomotion Level    Examination-Participation Restrictions Community Activity;Yard Work;Laundry;Cleaning;Shop    Stability/Clinical Decision Making Evolving/Moderate complexity    Rehab Potential Fair    PT Frequency Other (comment)   2-3x/week   PT Duration Other (comment)   16 weeks   PT Treatment/Interventions Electrical Stimulation;Gait training;Stair  training;Functional mobility training;Therapeutic activities;Therapeutic exercise;Balance training;Neuromuscular re-education;Wheelchair mobility training;Patient/family education    PT Next Visit Plan Gait re-training, task practice for transferring, LE strengthening, UE AROM and strengthening to assist with functional mobility and transfers    PT Home Exercise Plan Pt continuing with home health  exercises. Pt continuing with task practice for rolling, scooting, bridging in bed; walking with CGA with FWW; reaching and grasping with LUE.    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, initial encounter [facility])     Problem List Patient Active Problem List   Diagnosis Date Noted   Suspected deep tissue injury 10/25/2020   Wheelchair dependence 11/27/2019   Spasticity 11/27/2019   Hyperkalemia    Steroid-induced hyperglycemia    Prediabetes    Neurogenic bowel    Neurogenic bladder    Neuropathic pain    Quadriplegia ([place]) 08/31/2019   S/P cervical spinal fusion 08/28/2019   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 [place], [place], [address] Phone: [phone]   Fax:  [phone]  Name: Meredith Roach MRN: [id number] Date of Birth: May 13, 1940

## 2021-01-08 ENCOUNTER — Ambulatory Visit: Payer: Medicare PPO | Admitting: Physical Therapy

## 2021-01-08 ENCOUNTER — Other Ambulatory Visit: Payer: Self-pay

## 2021-01-08 ENCOUNTER — Encounter: Payer: Self-pay | Admitting: Physical Therapy

## 2021-01-08 DIAGNOSIS — S14123A 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, initial encounter: Secondary | ICD-10-CM

## 2021-01-08 DIAGNOSIS — R262 Difficulty in walking, not elsewhere classified: Secondary | ICD-10-CM

## 2021-01-08 DIAGNOSIS — M6281 Muscle weakness (generalized): Secondary | ICD-10-CM

## 2021-01-08 NOTE — Therapy (Signed)
[facility] [address] [place], [place], [address] Phone: [phone]   Fax:  [phone]  Physical Therapy Treatment  Patient Details  Name: Meredith Roach MRN: [id number] Date of Birth: 23-Oct-1940 Referring Provider (PT): [name], MD   Encounter Date: 01/08/2021   PT End of Session - 01/09/21 0846     Visit Number 17    Number of Visits 7    Date for PT Re-Evaluation 03/18/21    Authorization Time Period Cert 08/20/30-67/12/45, last progress note 12/23/20    Progress Note Due on Visit 20    PT Start Time 1144    PT Stop Time 1230    PT Time Calculation (min) 46 min    Equipment Utilized During Treatment Gait belt   Pt uses power wheelchair, clinic's front-wheeled walker utilized for gait   Activity Tolerance Patient tolerated treatment well    Behavior During Therapy [facility] for tasks assessed/performed             Past Medical History:  Diagnosis Date   Arthritis    GERD (gastroesophageal reflux disease)    Gout    Neuromuscular disorder ([place])    Pre-diabetes     Past Surgical History:  Procedure Laterality Date   ANTERIOR CERVICAL DECOMPRESSION/DISCECTOMY FUSION 4 LEVELS N/A 08/28/2019   Procedure: ANTERIOR CERVICAL DECOMPRESSION/DISCECTOMY FUSION CERVICALTHREE-FOUR CERVICAL,FOUR-FIVE,CERVICAL FIVE-SIX,CERVICAL SIX-SEVEN.;  Surgeon: [name], MD;  Location: [facility];  Service: Neurosurgery;  Laterality: N/A;  ANTERIOR   BACK SURGERY     CATARACT EXTRACTION W/PHACO Right 03/31/2018   Procedure: CATARACT EXTRACTION PHACO AND INTRAOCULAR LENS PLACEMENT ([place]);  Surgeon: [name], MD;  Location: [facility];  Service: Ophthalmology;  Laterality: Right;  [place] 00:39.7 CDE 4.35 Fluid Pack Lot # [id number]   CHOLECYSTECTOMY  1995   COLONOSCOPY WITH PROPOFOL N/A 06/07/2017   Procedure: COLONOSCOPY WITH PROPOFOL;  Surgeon: [name], MD;  Location: [facility] ENDOSCOPY;  Service: Endoscopy;  Laterality: N/A;    ESOPHAGOGASTRODUODENOSCOPY (EGD) WITH PROPOFOL N/A 06/07/2017   Procedure: ESOPHAGOGASTRODUODENOSCOPY (EGD) WITH PROPOFOL;  Surgeon: [name], MD;  Location: [facility]COPY;  Service: Endoscopy;  Laterality: N/A;   IR CATHETER TUBE CHANGE  05/10/2020   REDUCTION MAMMAPLASTY Bilateral 1994    There were no vitals filed for this visit.   Subjective Assessment - 01/08/21 1153     Subjective Pt reports feeling well overall. She reports her LLE has been tight and burning down to the 4th/5th digits. She reports some tingling and tightness on the outside of her foot. Patient reports improving ability to perform sit to stand at home. Pt is compliant with HEP including established OT and PT home exercises.    Patient is accompained by: Family member   husband   Pertinent History Patient is a 80 year old female s/p incomplete SCI. She had incomplete SCI following fall - C4 [place] D (DOI: 08/26/19). Primary activity limitations c difficulty walking, transferring, and completing independent functional mobility following incomplete SCI. She has undergone home health therapy for about one year. She and her husband reports she was able to transfer and ambulate with walker in home with PT walking beside her. Pt has practiced with front-wheel walker at home. 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 Patient reports using steady transfer aid for performing transfers chair to bed and W/C to standard chair. Her husband states that she is able to stand with steady device and can stand with her legs against the bed.  Pt has ramp to get in her home for her power wheelchair. Patient lives in one level home. Pt uses tub bench that she can utilize to slide into shower; her daughter/husband assists with bathing. Pt has Lucianne Lei that can accommodate her power chair at this time. Pt has concrete driveway to get up to her ramp. Pt has small threshold to get into her home - husband utilizes cardboard to get over threshold.     Limitations Walking;Standing;House hold activities;Other (comment)   transferring, bed mobility, bladder management   Patient Stated Goals to be able to walk more independently                TREATMENT     Neuromuscular Re-education - for increased motor recruitment of major muscle groups with motor impairment, nervous system priming and exercises to promote trunk stability    Seated march on Dynadisc with alternating UE/LE; x20 alternating   Lower limb combined hip/knee flexion and dorsiflexion with pawing with VMS for improved dorsiflexor activation. 200 pps phase duration, ramp 1 sec, cycle time 5/5, 32 mA intensity; x 8 minutes each lower extremity    Sitting edge of low mat, cross-body "boxing" with therapist's hand as target ("cross" punch) to facilitate active cross body adduction and trunk rotation as needed for rolling and reaching/grasping tasks; x 25 alternating     Standing with FWW with mat table behind patient -Unilateral upper extremity reaching and grasping in standing position with single UE support; fine motor grasping and trunk rotation to place items into container on opposite side of body (small clothes pins, small cylinder and cubes from "HAND PT" packet); x 3 min              *not today* Bridging; x25, partial range  Seated forward/backward trunk lean on Dynadisc; x25 Lower limb combined hip/knee flexion and dorsiflexion in unsupported sitting position, tactile cueing for upright posture and to facilitate active dorsiflexion with hip/knee flexion; x25 alternating  Seated pelvic tilt on Dynadisc; alternating hip hike; x25 alternating L/R Grasping L UE with fine motor pulp to pulp grasp, grabbing bolts and placing into adjacent container requiring trunk lean to L (in sitting on dynadisc)x 4 min Lower limb combined hip/knee flexion and dorsiflexion in unsupported sitting position, tactile cueing for upright posture and to facilitate active dorsiflexion  with hip/knee flexion; x25 alternating  -Standing forward kick with active dorsiflexion with verbal cueing prior to movement for "taking foot off of gas pedal"; x20 alternating Clamshells in side lying, no resistance, BLE; x20  -Standing bilateral weight shift in static standing with no UE support; x25 alternating Standing march, with alternating unilateral upper limb support; x20 Seated toe tapping onto Airex pad to promote active dorsiflexion during LE swing; x20 bilateral LE Pedal feet, with unilateral upper limb support; x10 with each arm holding single parallel bar  Standing with FWW with mat table behind patient -Standing hip flexion with visual and tactile cueing for increased range to improve foot clearance during gait; x10 each LE         Therapeutic Activities - patient education, repetitive task practice for improved performance of daily functional activities e.g. transferring     Stand pivot transfer to/from low mat with front-wheeled walker with ModA for completion of sit to stand, supervision for pivot, and CGA for stand to sit. IND setup of power wheelchair prior to performance of transfer; x2   Sit to stand from raised table; 1x10, no UE assist [use of posterior thigh to  facilitate end of sit to stand sequence following fatigue]   Gait with FWW in clinic; 2 x 80 feet, around gym; CGA/supervision throughout gait           *not today* Sit<>supine x 2 (via sidelying L, rolling onto back), pt requires mod A for trunk during sidelying to sit Rolling: 3 rolls in bilateral directions with VC on sequencing. Difficulty initiating roll to R with limited LUE cross-body adduction AROM; ModA for roll to L Seated march on edge of bed for improved lower limb management as needed for car and bed transfers; 20x alternating Standing march with bilateral UE support on walker (attempted R unilateral support with limited ability to fully lift L foot from ground); 2x15 alternating Bed  mobility task practice: performed bilateral rolling with ModA for upper and lower limbs as needed to promote independent bed mobility. MaxA for L arm cross-body adduction to facilitate R rolling. MinA for L rolling.              ASSESSMENT Patient demonstrates improving ability to gasp small items with pincer grasp and spherical grip using LUE. Patient is able to clear bilateral lower extremities better during swing phase of gait, but this is decreased following fatigue (pt dragging feet toward end of second walking trial today). Pt is continuing with work on LUE AROM, cross-body adduction, and trunk/hip strength required for improved bed mobility. Utilized NMES today for improved active dorsiflexion to "prime" CNS for gait with fair response and good toe clearance exhibited following stimulation. Patient has remaining deficits in increased tone of wrist/digit flexors (L>RUE), impaired trunk control, decreased LE strength and motor recruitment, gait deficits/deviations (most notably difficulty with L>R toe clearance during gait), decreased L>R shoulder AROM, and impaired postural stability in standing. Patient will benefit from continued skilled therapeutic intervention to address the above deficits as needed for improved function and QoL.            PT Education - 01/09/21 0847     Education Details Educated patient on desensitization techniques that could be utilized at home to decrease sensitivity affecting L lower extremity. Discussed progression of techniques as tolerated.    Person(s) Educated Patient;Spouse    Methods Explanation;Demonstration    Comprehension Verbalized understanding              PT Short Term Goals - 12/23/20 1354       PT SHORT TERM GOAL #1   Title Patient will perform stand-pivot transfer from chair to bed with supervision level of assist and ModI setup of power chair as needed for transferring at home    Baseline 11/26/20: Max assist for setup and mod  assist to perform sit to stand, unsteady pivot prior to performing stand to sit.    12/23/20: Patient is able to setup power chair for transfer ModI. Mod to Max assist for sit to stand. CGA level of assist for standing pivot and stand to sit on low mat.    Time 4    Period Weeks    Status Partially Met    Target Date 01/09/21      PT SHORT TERM GOAL #2   Title Patient will perform standing toe tap to 3-inch surface (Airex pad) with bilateral upper extremity support indicative of improved ability to clear feet from floor during stepping and improved weight shift to each LE with AD use    Baseline 11/26/20: Limited toe clearance during bilateral LE swing phase with front-wheeled walker.   12/23/20: performed  at previous follow-up with bilateral UE support.    Time 3    Period Weeks    Status Achieved    Target Date 12/17/20               PT Long Term Goals - 12/23/20 1312       PT LONG TERM GOAL #1   Title Patient will demonstrate improved function as evidenced by a score of 35 on FOTO measure for full participation in activities at home and in the community.    Baseline 11/26/20: FOTO 12.   12/23/20: FOTO 24.    Time 16    Period Weeks    Status Partially Met    Target Date 03/18/21      PT LONG TERM GOAL #2   Title Patient will ambulate for 150 feet without LOB, with sufficient toe clearance to prevent loss of gait stability, and c proper AD placement with least-restrictive assistive device with supervision level of assist    Baseline 11/26/20: Gait x 40 feet with CGA.   12/23/20: CGA with dec toe clearance L>RLE.    Time 12    Period Weeks    Status On-going    Target Date 02/19/21      PT LONG TERM GOAL #3   Title Patient will perform independent re-positioning in chair with use of closed-chain triceps on armrests as needed for independent wheelchair mobility with no verbal cueing or tactile cueing required    Baseline 11/26/20: Dependent re-positioning in wheelchair.   12/23/20:  Performed with feet on fold-out platform of her power wheelchair IND.    Time 8    Period Weeks    Status Achieved    Target Date 01/22/21      PT LONG TERM GOAL #4   Title Patient will perform simulated step-over task for clearing doorway threshold with use of front-wheeled walker as needed for entering/exiting home with supervision level of assistance and no LOB    Baseline 11/26/20: Pt currently uses power wheelchair to negotiate threshold into home.   12/23/20: Deferred    Time 16    Period Weeks    Status Deferred    Target Date 03/18/21                   Plan - 01/09/21 1259     Clinical Impression Statement Patient demonstrates improving ability to gasp small items with pincer grasp and spherical grip using LUE. Patient is able to clear bilateral lower extremities better during swing phase of gait, but this is decreased following fatigue (pt dragging feet toward end of second walking trial today). Pt is continuing with work on LUE AROM, cross-body adduction, and trunk/hip strength required for improved bed mobility. Utilized NMES today for improved active dorsiflexion to "prime" CNS for gait with fair response and good toe clearance exhibited following stimulation. Patient has remaining deficits in increased tone of wrist/digit flexors (L>RUE), impaired trunk control, decreased LE strength and motor recruitment, gait deficits/deviations (most notably difficulty with L>R toe clearance during gait), decreased L>R shoulder AROM, and impaired postural stability in standing. Patient will benefit from continued skilled therapeutic intervention to address the above deficits as needed for improved function and QoL.    Personal Factors and Comorbidities Age;Comorbidity 3+;Time since onset of injury/illness/exacerbation    Comorbidities GERD, pre-diabetes, obesity, gout, s/p C3-7 ACDF    Examination-Activity Limitations Bathing;Continence;Reach Overhead;Stairs;Bed Mobility;Dressing;Self  Feeding;Stand;Hygiene/Grooming;Toileting;Transfers;Locomotion Level    Examination-Participation Restrictions Community Activity;Yard Work;Laundry;Cleaning;Shop    Stability/Clinical Decision  Making Evolving/Moderate complexity    Rehab Potential Fair    PT Frequency Other (comment)   2-3x/week   PT Duration Other (comment)   16 weeks   PT Treatment/Interventions Electrical Stimulation;Gait training;Stair training;Functional mobility training;Therapeutic activities;Therapeutic exercise;Balance training;Neuromuscular re-education;Wheelchair mobility training;Patient/family education    PT Next Visit Plan Gait re-training, task practice for transferring, LE strengthening, UE AROM and strengthening to assist with functional mobility and transfers    PT Home Exercise Plan Pt continuing with home health exercises. Pt continuing with task practice for rolling, scooting, bridging in bed; walking with CGA with FWW; reaching and grasping with LUE.    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, initial encounter [facility])     Problem List Patient Active Problem List   Diagnosis Date Noted   Suspected deep tissue injury 10/25/2020   Wheelchair dependence 11/27/2019   Spasticity 11/27/2019   Hyperkalemia    Steroid-induced hyperglycemia    Prediabetes    Neurogenic bowel    Neurogenic bladder    Neuropathic pain    Quadriplegia ([place]) 08/31/2019   S/P cervical spinal fusion 08/28/2019   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 [place], [place], [address] Phone: [phone]   Fax:  [phone]  Name: Meredith Roach MRN: [id number] Date of Birth: 12/28/1940

## 2021-01-09 ENCOUNTER — Encounter: Payer: Self-pay | Admitting: Physical Therapy

## 2021-01-09 ENCOUNTER — Ambulatory Visit: Payer: Medicare PPO | Admitting: Physical Therapy

## 2021-01-09 DIAGNOSIS — R262 Difficulty in walking, not elsewhere classified: Secondary | ICD-10-CM | POA: Diagnosis not present

## 2021-01-09 DIAGNOSIS — S14123A 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, initial encounter: Secondary | ICD-10-CM

## 2021-01-09 DIAGNOSIS — M6281 Muscle weakness (generalized): Secondary | ICD-10-CM

## 2021-01-09 NOTE — Therapy (Signed)
[place] [facility] [address] [place], [place], [address] Phone: [phone]   Fax:  [phone]  Physical Therapy Treatment  Patient Details  Name: Meredith Roach MRN: [id number] Date of Birth: January 02, 1941 Referring Provider (PT): [name], MD   Encounter Date: 01/09/2021   PT End of Session - 01/10/21 0857     Visit Number 18    Number of Visits 7    Date for PT Re-Evaluation 03/18/21    Authorization Time Period Cert 9/56/38-75/64/33, last progress note 12/23/20    Progress Note Due on Visit 20    PT Start Time 1344    PT Stop Time 1435    PT Time Calculation (min) 51 min    Equipment Utilized During Treatment Gait belt   Pt uses power wheelchair, clinic's front-wheeled walker utilized for gait   Activity Tolerance Patient tolerated treatment well    Behavior During Therapy [facility] for tasks assessed/performed             Past Medical History:  Diagnosis Date   Arthritis    GERD (gastroesophageal reflux disease)    Gout    Neuromuscular disorder ([place])    Pre-diabetes     Past Surgical History:  Procedure Laterality Date   ANTERIOR CERVICAL DECOMPRESSION/DISCECTOMY FUSION 4 LEVELS N/A 08/28/2019   Procedure: ANTERIOR CERVICAL DECOMPRESSION/DISCECTOMY FUSION CERVICALTHREE-FOUR CERVICAL,FOUR-FIVE,CERVICAL FIVE-SIX,CERVICAL SIX-SEVEN.;  Surgeon: [name], MD;  Location: [facility];  Service: Neurosurgery;  Laterality: N/A;  ANTERIOR   BACK SURGERY     CATARACT EXTRACTION W/PHACO Right 03/31/2018   Procedure: CATARACT EXTRACTION PHACO AND INTRAOCULAR LENS PLACEMENT ([place]);  Surgeon: [name], MD;  Location: [facility];  Service: Ophthalmology;  Laterality: Right;  [place] 00:39.7 CDE 4.35 Fluid Pack Lot # [id number]   CHOLECYSTECTOMY  1995   COLONOSCOPY WITH PROPOFOL N/A 06/07/2017   Procedure: COLONOSCOPY WITH PROPOFOL;  Surgeon: [name], MD;  Location: [facility] ENDOSCOPY;  Service: Endoscopy;  Laterality: N/A;    ESOPHAGOGASTRODUODENOSCOPY (EGD) WITH PROPOFOL N/A 06/07/2017   Procedure: ESOPHAGOGASTRODUODENOSCOPY (EGD) WITH PROPOFOL;  Surgeon: [name], MD;  Location: [facility] ENDOSCOPY;  Service: Endoscopy;  Laterality: N/A;   IR CATHETER TUBE CHANGE  05/10/2020   REDUCTION MAMMAPLASTY Bilateral 1994    There were no vitals filed for this visit.   Subjective Assessment - 01/10/21 0856     Subjective She reports improved sensation of tightness and sensitivity in LLE relative to how she felt yesterday. She reports ongoing compliance with HEP. Pt feels that her ability to stand/bear weight and complete transferring and bed mobility has improved since outset of outpatient PT.    Patient is accompained by: Family member   husband   Pertinent History Patient is a 80 year old female s/p incomplete SCI. She had incomplete SCI following fall - C4 [place] D (DOI: 08/26/19). Primary activity limitations c difficulty walking, transferring, and completing independent functional mobility following incomplete SCI. She has undergone home health therapy for about one year. She and her husband reports she was able to transfer and ambulate with walker in home with PT walking beside her. Pt has practiced with front-wheel walker at home. 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 Patient reports using steady transfer aid for performing transfers chair to bed and W/C to standard chair. Her husband states that she is able to stand with steady device and can stand with her legs against the bed. Pt has ramp to get in her home for her power  wheelchair. Patient lives in one level home. Pt uses tub bench that she can utilize to slide into shower; her daughter/husband assists with bathing. Pt has Meredith Roach that can accommodate her power chair at this time. Pt has concrete driveway to get up to her ramp. Pt has small threshold to get into her home - husband utilizes cardboard to get over threshold.    Limitations Walking;Standing;House  hold activities;Other (comment)   transferring, bed mobility, bladder management   Patient Stated Goals to be able to walk more independently               TREATMENT     Neuromuscular Re-education - for increased motor recruitment of major muscle groups with motor impairment, nervous system priming and exercises to promote trunk stability    Seated march on Dynadisc with alternating UE/LE; x20 alternating     Standing in parallel bars with chair behind patient -Unilateral upper extremity reaching and grasping in standing position with minimal UE support (touching on rail prn if losing balance); fine motor grasping and trunk rotation to place items into container on opposite side of body (small clothes pins, small cylinder and cubes from "HAND PT" packet); x 3 min - utilized smaller items today, cubes with 1-cm width       *next visit* Sitting edge of low mat, cross-body "boxing" with therapist's hand as target ("cross" punch) to facilitate active cross body adduction and trunk rotation as needed for rolling and reaching/grasping tasks; x 25 alternating      *not today* Lower limb combined hip/knee flexion and dorsiflexion with pawing with VMS for improved dorsiflexor activation. 200 pps phase duration, ramp 1 sec, cycle time 5/5, 32 mA intensity; x 8 minutes each lower extremity Bridging; x25, partial range  Seated forward/backward trunk lean on Dynadisc; x25 Lower limb combined hip/knee flexion and dorsiflexion in unsupported sitting position, tactile cueing for upright posture and to facilitate active dorsiflexion with hip/knee flexion; x25 alternating  Seated pelvic tilt on Dynadisc; alternating hip hike; x25 alternating L/R Grasping L UE with fine motor pulp to pulp grasp, grabbing bolts and placing into adjacent container requiring trunk lean to L (in sitting on dynadisc)x 4 min Lower limb combined hip/knee flexion and dorsiflexion in unsupported sitting position, tactile  cueing for upright posture and to facilitate active dorsiflexion with hip/knee flexion; x25 alternating  -Standing forward kick with active dorsiflexion with verbal cueing prior to movement for "taking foot off of gas pedal"; x20 alternating Clamshells in side lying, no resistance, BLE; x20  -Standing bilateral weight shift in static standing with no UE support; x25 alternating Standing march, with alternating unilateral upper limb support; x20 Seated toe tapping onto Airex pad to promote active dorsiflexion during LE swing; x20 bilateral LE Pedal feet, with unilateral upper limb support; x10 with each arm holding single parallel bar  Standing with FWW with mat table behind patient -Standing hip flexion with visual and tactile cueing for increased range to improve foot clearance during gait; x10 each LE         Therapeutic Activities - patient education, repetitive task practice for improved performance of daily functional activities e.g. transferring     Stand pivot transfer to/from low mat with front-wheeled walker with ModA for completion of sit to stand, supervision for pivot, and CGA for stand to sit. IND setup of power wheelchair prior to performance of transfer; x2   Sit to stand from raised table; 2x10, no UE assist   Gait with FWW in  clinic; 1 x 80 feet, around gym; CGA/supervision throughout gait    In // bars: Standing march with no UE support on walker  2x15 alternating Staggered stance weight shift; x20 bilat LE Standing heel raise/toe raise; 2x15 alternating        *not today* Sit<>supine x 2 (via sidelying L, rolling onto back), pt requires mod A for trunk during sidelying to sit Rolling: 3 rolls in bilateral directions with VC on sequencing. Difficulty initiating roll to R with limited LUE cross-body adduction AROM; ModA for roll to L Seated march on edge of bed for improved lower limb management as needed for car and bed transfers; 20x alternating Bed mobility task  practice: performed bilateral rolling with ModA for upper and lower limbs as needed to promote independent bed mobility. MaxA for L arm cross-body adduction to facilitate R rolling. MinA for L rolling.              ASSESSMENT Patient demonstrates strong activation of bilateral ankle dorsiflexors, though weakness in dorsiflexors still remains. She exhibits notable difficulty with accessing full hip flexion AROM actively, which may limited LE clearance during swing phase. Continued work on weight shifting and gait re-training today as well as strategies to strengthen lower limbs in weightbearing. She is able to grasp smaller items using pulp-to-pulp grasp, though she does still have limited mobility of LUE digits. Patient tolerates session well today with notable fatigue by end of session. Patient has remaining deficits in increased tone of wrist/digit flexors (L>RUE), impaired trunk control, decreased LE strength and motor recruitment, gait deficits/deviations (most notably difficulty with L>R toe clearance during gait), decreased L>R shoulder AROM, and impaired postural stability in standing. Patient will benefit from continued skilled therapeutic intervention to address the above deficits as needed for improved function and QoL.           PT Short Term Goals - 12/23/20 1354       PT SHORT TERM GOAL #1   Title Patient will perform stand-pivot transfer from chair to bed with supervision level of assist and ModI setup of power chair as needed for transferring at home    Baseline 11/26/20: Max assist for setup and mod assist to perform sit to stand, unsteady pivot prior to performing stand to sit.    12/23/20: Patient is able to setup power chair for transfer ModI. Mod to Max assist for sit to stand. CGA level of assist for standing pivot and stand to sit on low mat.    Time 4    Period Weeks    Status Partially Met    Target Date 01/09/21      PT SHORT TERM GOAL #2   Title Patient will perform  standing toe tap to 3-inch surface (Airex pad) with bilateral upper extremity support indicative of improved ability to clear feet from floor during stepping and improved weight shift to each LE with AD use    Baseline 11/26/20: Limited toe clearance during bilateral LE swing phase with front-wheeled walker.   12/23/20: performed at previous follow-up with bilateral UE support.    Time 3    Period Weeks    Status Achieved    Target Date 12/17/20               PT Long Term Goals - 12/23/20 1312       PT LONG TERM GOAL #1   Title Patient will demonstrate improved function as evidenced by a score of 35 on FOTO measure for full  participation in activities at home and in the community.    Baseline 11/26/20: FOTO 12.   12/23/20: FOTO 24.    Time 16    Period Weeks    Status Partially Met    Target Date 03/18/21      PT LONG TERM GOAL #2   Title Patient will ambulate for 150 feet without LOB, with sufficient toe clearance to prevent loss of gait stability, and c proper AD placement with least-restrictive assistive device with supervision level of assist    Baseline 11/26/20: Gait x 40 feet with CGA.   12/23/20: CGA with dec toe clearance L>RLE.    Time 12    Period Weeks    Status On-going    Target Date 02/19/21      PT LONG TERM GOAL #3   Title Patient will perform independent re-positioning in chair with use of closed-chain triceps on armrests as needed for independent wheelchair mobility with no verbal cueing or tactile cueing required    Baseline 11/26/20: Dependent re-positioning in wheelchair.   12/23/20: Performed with feet on fold-out platform of her power wheelchair IND.    Time 8    Period Weeks    Status Achieved    Target Date 01/22/21      PT LONG TERM GOAL #4   Title Patient will perform simulated step-over task for clearing doorway threshold with use of front-wheeled walker as needed for entering/exiting home with supervision level of assistance and no LOB    Baseline  11/26/20: Pt currently uses power wheelchair to negotiate threshold into home.   12/23/20: Deferred    Time 16    Period Weeks    Status Deferred    Target Date 03/18/21                   Plan - 01/10/21 0905     Clinical Impression Statement Patient demonstrates strong activation of bilateral ankle dorsiflexors, though weakness in dorsiflexors still remains. She exhibits notable difficulty with accessing full hip flexion AROM actively, which may limited LE clearance during swing phase. Continued work on weight shifting and gait re-training today as well as strategies to strengthen lower limbs in weightbearing. She is able to grasp smaller items using pulp-to-pulp grasp, though she does still have limited mobility of LUE digits. Patient tolerates session well today with notable fatigue by end of session. Patient has remaining deficits in increased tone of wrist/digit flexors (L>RUE), impaired trunk control, decreased LE strength and motor recruitment, gait deficits/deviations (most notably difficulty with L>R toe clearance during gait), decreased L>R shoulder AROM, and impaired postural stability in standing. Patient will benefit from continued skilled therapeutic intervention to address the above deficits as needed for improved function and QoL.    Personal Factors and Comorbidities Age;Comorbidity 3+;Time since onset of injury/illness/exacerbation    Comorbidities GERD, pre-diabetes, obesity, gout, s/p C3-7 ACDF    Examination-Activity Limitations Bathing;Continence;Reach Overhead;Stairs;Bed Mobility;Dressing;Self Feeding;Stand;Hygiene/Grooming;Toileting;Transfers;Locomotion Level    Examination-Participation Restrictions Community Activity;Yard Work;Laundry;Cleaning;Shop    Stability/Clinical Decision Making Evolving/Moderate complexity    Rehab Potential Fair    PT Frequency Other (comment)   2-3x/week   PT Duration Other (comment)   16 weeks   PT Treatment/Interventions Electrical  Stimulation;Gait training;Stair training;Functional mobility training;Therapeutic activities;Therapeutic exercise;Balance training;Neuromuscular re-education;Wheelchair mobility training;Patient/family education    PT Next Visit Plan Gait re-training, task practice for transferring, LE strengthening, UE AROM and strengthening to assist with functional mobility and transfers    PT Home Exercise Plan Pt continuing with  home health exercises. Pt continuing with task practice for rolling, scooting, bridging in bed; walking with CGA with FWW; reaching and grasping with LUE.    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, initial encounter [facility])     Problem List Patient Active Problem List   Diagnosis Date Noted   Suspected deep tissue injury 10/25/2020   Wheelchair dependence 11/27/2019   Spasticity 11/27/2019   Hyperkalemia    Steroid-induced hyperglycemia    Prediabetes    Neurogenic bowel    Neurogenic bladder    Neuropathic pain    Quadriplegia ([place]) 08/31/2019   S/P cervical spinal fusion 08/28/2019   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 [address] [place], [place], [address] Phone: [phone]   Fax:  [phone]  Name: EDIN SKARDA MRN: [id number] Date of Birth: 30-Sep-1940

## 2021-01-13 ENCOUNTER — Encounter: Payer: Self-pay | Admitting: Physical Therapy

## 2021-01-13 ENCOUNTER — Other Ambulatory Visit: Payer: Self-pay

## 2021-01-13 ENCOUNTER — Ambulatory Visit: Payer: Medicare PPO | Attending: Physical Medicine and Rehabilitation | Admitting: Physical Therapy

## 2021-01-13 DIAGNOSIS — R262 Difficulty in walking, not elsewhere classified: Secondary | ICD-10-CM | POA: Insufficient documentation

## 2021-01-13 DIAGNOSIS — M6281 Muscle weakness (generalized): Secondary | ICD-10-CM | POA: Diagnosis present

## 2021-01-13 DIAGNOSIS — S14123A 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, initial encounter: Secondary | ICD-10-CM | POA: Insufficient documentation

## 2021-01-13 NOTE — Therapy (Signed)
[facility] [address] [place], [place], [address] Phone: [phone]   Fax:  [phone]  Physical Therapy Treatment  Patient Details  Name: Meredith Roach MRN: [id number] Date of Birth: 07-22-1940 Referring Provider (PT): [name], MD   Encounter Date: 01/13/2021   PT End of Session - 01/13/21 1139     Visit Number 19    Number of Visits 62    Date for PT Re-Evaluation 03/18/21    Authorization Time Period Cert 06/26/15-61/60/73, last progress note 12/23/20    Progress Note Due on Visit 20    PT Start Time 1140    PT Stop Time 1232    PT Time Calculation (min) 52 min    Equipment Utilized During Treatment Gait belt   Pt uses power wheelchair, clinic's front-wheeled walker utilized for gait   Activity Tolerance Patient tolerated treatment well    Behavior During Therapy WFL for tasks assessed/performed             Past Medical History:  Diagnosis Date   Arthritis    GERD (gastroesophageal reflux disease)    Gout    Neuromuscular disorder ([place])    Pre-diabetes     Past Surgical History:  Procedure Laterality Date   ANTERIOR CERVICAL DECOMPRESSION/DISCECTOMY FUSION 4 LEVELS N/A 08/28/2019   Procedure: ANTERIOR CERVICAL DECOMPRESSION/DISCECTOMY FUSION CERVICALTHREE-FOUR CERVICAL,FOUR-FIVE,CERVICAL FIVE-SIX,CERVICAL SIX-SEVEN.;  Surgeon: [name], MD;  Location: [place];  Service: Neurosurgery;  Laterality: N/A;  ANTERIOR   BACK SURGERY     CATARACT EXTRACTION W/PHACO Right 03/31/2018   Procedure: CATARACT EXTRACTION PHACO AND INTRAOCULAR LENS PLACEMENT ([place]);  Surgeon: [name], MD;  Location: [facility];  Service: Ophthalmology;  Laterality: Right;  [place] 00:39.7 CDE 4.35 Fluid Pack Lot # [id number]   CHOLECYSTECTOMY  1995   COLONOSCOPY WITH PROPOFOL N/A 06/07/2017   Procedure: COLONOSCOPY WITH PROPOFOL;  Surgeon: [name], MD;  Location: [facility] ENDOSCOPY;  Service: Endoscopy;  Laterality: N/A;    ESOPHAGOGASTRODUODENOSCOPY (EGD) WITH PROPOFOL N/A 06/07/2017   Procedure: ESOPHAGOGASTRODUODENOSCOPY (EGD) WITH PROPOFOL;  Surgeon: [name], MD;  Location: [facility] ENDOSCOPY;  Service: Endoscopy;  Laterality: N/A;   IR CATHETER TUBE CHANGE  05/10/2020   REDUCTION MAMMAPLASTY Bilateral 1994    There were no vitals filed for this visit.   Subjective Assessment - 01/13/21 1142     Subjective She reports notable burning and hypersensitivity affecting L plantar and dorsal MTP region and along lateral thigh and leg. Pt does of Hx of dependent edema for which she is elevating feet daily. Pt has not had recent fever, inejction, or night pain/night sweats. She feels that this is better this AM. She does get intermittent "tightness" in L lower limb following fatigue. Pt reports ongoing compliance with HEP, but states she had difficulty performing significant standing activity yeterday due to the symptoms above.    Patient is accompained by: Family member   husband   Pertinent History Patient is a 80 year old female s/p incomplete SCI. She had incomplete SCI following fall - C4 [place] D (DOI: 08/26/19). Primary activity limitations c difficulty walking, transferring, and completing independent functional mobility following incomplete SCI. She has undergone home health therapy for about one year. She and her husband reports she was able to transfer and ambulate with walker in home with PT walking beside her. Pt has practiced with front-wheel walker at home. 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 Patient reports using steady transfer aid for performing  transfers chair to bed and W/C to standard chair. Her husband states that she is able to stand with steady device and can stand with her legs against the bed. Pt has ramp to get in her home for her power wheelchair. Patient lives in one level home. Pt uses tub bench that she can utilize to slide into shower; her daughter/husband assists with bathing.  Pt has Lucianne Lei that can accommodate her power chair at this time. Pt has concrete driveway to get up to her ramp. Pt has small threshold to get into her home - husband utilizes cardboard to get over threshold.    Limitations Walking;Standing;House hold activities;Other (comment)   transferring, bed mobility, bladder management   Patient Stated Goals to be able to walk more independently              TREATMENT     Neuromuscular Re-education - for increased motor recruitment of major muscle groups with motor impairment, nervous system priming and exercises to promote trunk stability       Standing in confines of walker with chair behind patient -Unilateral upper extremity reaching and grasping in standing position with minimal UE support (touching on rail prn if losing balance); fine motor grasping and trunk rotation to place items into container on opposite side of body (small clothes pins, small cylinder and cubes from "HAND PT" packet); x 8 min - utilized smaller items today, cubes with 1-cm width       *next visit* Sitting edge of low mat, cross-body "boxing" with therapist's hand as target ("cross" punch) to facilitate active cross body adduction and trunk rotation as needed for rolling and reaching/grasping tasks; x 25 alternating       *not today* Seated march on Dynadisc with alternating UE/LE; x20 alternating Lower limb combined hip/knee flexion and dorsiflexion with pawing with VMS for improved dorsiflexor activation. 200 pps phase duration, ramp 1 sec, cycle time 5/5, 32 mA intensity; x 8 minutes each lower extremity Bridging; x25, partial range  Seated forward/backward trunk lean on Dynadisc; x25 Lower limb combined hip/knee flexion and dorsiflexion in unsupported sitting position, tactile cueing for upright posture and to facilitate active dorsiflexion with hip/knee flexion; x25 alternating  Seated pelvic tilt on Dynadisc; alternating hip hike; x25 alternating L/R Grasping L  UE with fine motor pulp to pulp grasp, grabbing bolts and placing into adjacent container requiring trunk lean to L (in sitting on dynadisc)x 4 min Lower limb combined hip/knee flexion and dorsiflexion in unsupported sitting position, tactile cueing for upright posture and to facilitate active dorsiflexion with hip/knee flexion; x25 alternating  -Standing forward kick with active dorsiflexion with verbal cueing prior to movement for "taking foot off of gas pedal"; x20 alternating Clamshells in side lying, no resistance, BLE; x20  -Standing bilateral weight shift in static standing with no UE support; x25 alternating Standing march, with alternating unilateral upper limb support; x20 Seated toe tapping onto Airex pad to promote active dorsiflexion during LE swing; x20 bilateral LE Pedal feet, with unilateral upper limb support; x10 with each arm holding single parallel bar  Standing with FWW with mat table behind patient -Standing hip flexion with visual and tactile cueing for increased range to improve foot clearance during gait; x10 each LE         Therapeutic Activities - patient education, repetitive task practice for improved performance of daily functional activities e.g. transferring     Stand pivot transfer to/from wheelchair with front-wheeled walker with MinA for completion of sit  to stand, supervision for pivot, and CGA for stand to sit. IND setup of power wheelchair prior to performance of transfer; x1   Sit to stand from standard chair + Airex (pt seated on Airex); 20x, no UE assist   Gait with FWW in clinic; 2 x 80 feet, around gym; CGA/supervision throughout gait     Standing in confines of walker with chair behind patient Staggered stance weight shift; x20 bilat LE Standing heel raise/toe raise; 2x15 alternating     *next visit* Standing march with no UE support on walker  2x15 alternating      *not today* Sit<>supine x 2 (via sidelying L, rolling onto back), pt  requires mod A for trunk during sidelying to sit Rolling: 3 rolls in bilateral directions with VC on sequencing. Difficulty initiating roll to R with limited LUE cross-body adduction AROM; ModA for roll to L Seated march on edge of bed for improved lower limb management as needed for car and bed transfers; 20x alternating Bed mobility task practice: performed bilateral rolling with ModA for upper and lower limbs as needed to promote independent bed mobility. MaxA for L arm cross-body adduction to facilitate R rolling. MinA for L rolling.              ASSESSMENT Patient has had recent difficulty with management of neuralgias affecting L lower limb. Discussed f/u with physician for medical management, ongoing use of elevation and ankle pumps to assist with blood flow, and use of desensitization exercise at home. Patient exhibits poor toe clearance bilaterally that improves with successive steps; it is decreased again following significant fatigue after second set of ambulation around gym (lap around open gym x 80 ft). She has significant difficulty with fine motor grasping for uneven shapes, but she is able to grab cubes and cylinders of any size today during standing reaching and grasping drill. Pt does exhibit increased trunk flexion following fatigue, indicative of ongoing truncal weakness.  Patient has remaining deficits in increased tone of wrist/digit flexors (L>RUE), impaired trunk control, decreased LE strength and motor recruitment, gait deficits/deviations (most notably difficulty with L>R toe clearance during gait), decreased L>R shoulder AROM, and impaired postural stability in standing. Patient will benefit from continued skilled therapeutic intervention to address the above deficits as needed for improved function and QoL.        PT Short Term Goals - 12/23/20 1354       PT SHORT TERM GOAL #1   Title Patient will perform stand-pivot transfer from chair to bed with supervision level of  assist and ModI setup of power chair as needed for transferring at home    Baseline 11/26/20: Max assist for setup and mod assist to perform sit to stand, unsteady pivot prior to performing stand to sit.    12/23/20: Patient is able to setup power chair for transfer ModI. Mod to Max assist for sit to stand. CGA level of assist for standing pivot and stand to sit on low mat.    Time 4    Period Weeks    Status Partially Met    Target Date 01/09/21      PT SHORT TERM GOAL #2   Title Patient will perform standing toe tap to 3-inch surface (Airex pad) with bilateral upper extremity support indicative of improved ability to clear feet from floor during stepping and improved weight shift to each LE with AD use    Baseline 11/26/20: Limited toe clearance during bilateral LE swing phase with front-wheeled  walker.   12/23/20: performed at previous follow-up with bilateral UE support.    Time 3    Period Weeks    Status Achieved    Target Date 12/17/20               PT Long Term Goals - 12/23/20 1312       PT LONG TERM GOAL #1   Title Patient will demonstrate improved function as evidenced by a score of 35 on FOTO measure for full participation in activities at home and in the community.    Baseline 11/26/20: FOTO 12.   12/23/20: FOTO 24.    Time 16    Period Weeks    Status Partially Met    Target Date 03/18/21      PT LONG TERM GOAL #2   Title Patient will ambulate for 150 feet without LOB, with sufficient toe clearance to prevent loss of gait stability, and c proper AD placement with least-restrictive assistive device with supervision level of assist    Baseline 11/26/20: Gait x 40 feet with CGA.   12/23/20: CGA with dec toe clearance L>RLE.    Time 12    Period Weeks    Status On-going    Target Date 02/19/21      PT LONG TERM GOAL #3   Title Patient will perform independent re-positioning in chair with use of closed-chain triceps on armrests as needed for independent wheelchair mobility  with no verbal cueing or tactile cueing required    Baseline 11/26/20: Dependent re-positioning in wheelchair.   12/23/20: Performed with feet on fold-out platform of her power wheelchair IND.    Time 8    Period Weeks    Status Achieved    Target Date 01/22/21      PT LONG TERM GOAL #4   Title Patient will perform simulated step-over task for clearing doorway threshold with use of front-wheeled walker as needed for entering/exiting home with supervision level of assistance and no LOB    Baseline 11/26/20: Pt currently uses power wheelchair to negotiate threshold into home.   12/23/20: Deferred    Time 16    Period Weeks    Status Deferred    Target Date 03/18/21                   Plan - 01/13/21 1254     Clinical Impression Statement Patient has had recent difficulty with management of neuralgias affecting L lower limb. Discussed f/u with physician for medical management, ongoing use of elevation and ankle pumps to assist with blood flow, and use of desensitization exercise at home. Patient exhibits poor toe clearance bilaterally that improves with successive steps; it is decreased again following significant fatigue after second set of ambulation around gym (lap around open gym x 80 ft). She has significant difficulty with fine motor grasping for uneven shapes, but she is able to grab cubes and cylinders of any size today during standing reaching and grasping drill. Pt does exhibit increased trunk flexion following fatigue, indicative of ongoing truncal weakness.  Patient has remaining deficits in increased tone of wrist/digit flexors (L>RUE), impaired trunk control, decreased LE strength and motor recruitment, gait deficits/deviations (most notably difficulty with L>R toe clearance during gait), decreased L>R shoulder AROM, and impaired postural stability in standing. Patient will benefit from continued skilled therapeutic intervention to address the above deficits as needed for improved  function and QoL.    Personal Factors and Comorbidities Age;Comorbidity 3+;Time since onset of injury/illness/exacerbation  Comorbidities GERD, pre-diabetes, obesity, gout, s/p C3-7 ACDF    Examination-Activity Limitations Bathing;Continence;Reach Overhead;Stairs;Bed Mobility;Dressing;Self Feeding;Stand;Hygiene/Grooming;Toileting;Transfers;Locomotion Level    Examination-Participation Restrictions Community Activity;Yard Work;Laundry;Cleaning;Shop    Stability/Clinical Decision Making Evolving/Moderate complexity    Rehab Potential Fair    PT Frequency Other (comment)   2-3x/week   PT Duration Other (comment)   16 weeks   PT Treatment/Interventions Electrical Stimulation;Gait training;Stair training;Functional mobility training;Therapeutic activities;Therapeutic exercise;Balance training;Neuromuscular re-education;Wheelchair mobility training;Patient/family education    PT Next Visit Plan Gait re-training, task practice for transferring, LE strengthening, UE AROM and strengthening to assist with functional mobility and transfers    PT Home Exercise Plan Pt continuing with home health exercises. Pt continuing with task practice for rolling, scooting, bridging in bed; walking with CGA with FWW; reaching and grasping with LUE.    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, initial encounter [facility])     Problem List Patient Active Problem List   Diagnosis Date Noted   Suspected deep tissue injury 10/25/2020   Wheelchair dependence 11/27/2019    Spasticity 11/27/2019   Hyperkalemia    Steroid-induced hyperglycemia    Prediabetes    Neurogenic bowel    Neurogenic bladder    Neuropathic pain    Quadriplegia ([place]) 08/31/2019   S/P cervical spinal fusion 08/28/2019   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 [place], [place], [address] Phone: [phone]   Fax:  [phone]  Name: DORAINE SCHEXNIDER MRN: [id number] Date of Birth: Jun 07, 1940

## 2021-01-14 ENCOUNTER — Encounter: Payer: Medicare PPO | Attending: Physical Medicine and Rehabilitation | Admitting: Physical Medicine & Rehabilitation

## 2021-01-14 ENCOUNTER — Encounter: Payer: Self-pay | Admitting: Physical Medicine & Rehabilitation

## 2021-01-14 ENCOUNTER — Other Ambulatory Visit: Payer: Self-pay

## 2021-01-14 VITALS — BP 107/70 | HR 83 | Temp 98.9°F

## 2021-01-14 DIAGNOSIS — R252 Cramp and spasm: Secondary | ICD-10-CM | POA: Diagnosis present

## 2021-01-14 DIAGNOSIS — F411 Generalized anxiety disorder: Secondary | ICD-10-CM | POA: Diagnosis present

## 2021-01-14 DIAGNOSIS — N319 Neuromuscular dysfunction of bladder, unspecified: Secondary | ICD-10-CM | POA: Insufficient documentation

## 2021-01-14 DIAGNOSIS — Z993 Dependence on wheelchair: Secondary | ICD-10-CM | POA: Diagnosis present

## 2021-01-14 DIAGNOSIS — G8250 Quadriplegia, unspecified: Secondary | ICD-10-CM

## 2021-01-14 NOTE — Patient Instructions (Signed)

## 2021-01-14 NOTE — Progress Notes (Signed)
Botox Injection for spasticity using needle EMG guidance  Patient referred by Dr. Alice Rieger for botulinum toxin injection to control severe spasticity left upper extremity.  Patient gets only partial relief with medication management and is currently getting outpatient PT OT with only partial relief.  Patient has some residual grasp function that she uses for bimanual holding.  Her main affected extremity is left upper. Per MD note spasticity affecting the hand is the most problematic.  Last botulinum toxin injection was performed about 3 months ago the patient feels like it is starting to wear off. Tone is MAS 2 at the lumbricals as well as finger flexors as well as elbow flexor on the left side Discussed average duration of Botox as well as potential effects of excessive weakness.  The patient would like to proceed.  We discussed the average onset is 1 to 2 weeks.  Dilution: 50 Units/ml Indication: Severe spasticity which interferes with ADL,mobility and/or  hygiene and is unresponsive to medication management and other conservative care Informed consent was obtained after describing risks and benefits of the procedure with the patient. This includes bleeding, bruising, infection, excessive weakness, or medication side effects. A REMS form is on file and signed. Needle: 27g 1" needle electrode Number of units per muscle  Brachioradialis 25 FCR0 FCU0 FDS50 FDP50 FPL0 Lumbricals 25 x 3=75  All injections were done after obtaining appropriate EMG activity and after negative drawback for blood. The patient tolerated the procedure well. Post procedure instructions were given.

## 2021-01-15 ENCOUNTER — Ambulatory Visit: Payer: Medicare PPO | Admitting: Physical Therapy

## 2021-01-15 ENCOUNTER — Encounter: Payer: Self-pay | Admitting: Physical Therapy

## 2021-01-15 DIAGNOSIS — R262 Difficulty in walking, not elsewhere classified: Secondary | ICD-10-CM | POA: Diagnosis not present

## 2021-01-15 DIAGNOSIS — M6281 Muscle weakness (generalized): Secondary | ICD-10-CM

## 2021-01-15 DIAGNOSIS — S14123A 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, initial encounter: Secondary | ICD-10-CM

## 2021-01-15 NOTE — Therapy (Signed)
[facility] [address] [place], [place], [address] Phone: [phone]   Fax:  [phone]  Physical Therapy Treatment/ Physical Therapy Progress Note   Dates of reporting period  12/23/20   to   01/15/21   Patient Details  Name: Meredith Roach MRN: [id number] Date of Birth: 1941/01/18 Referring Provider (PT): [name], MD   Encounter Date: 01/15/2021   PT End of Session - 01/15/21 1155     Visit Number 20    Number of Visits 85    Date for PT Re-Evaluation 03/18/21    Authorization Time Period Cert 04/13/73-02/05/84, last progress note 12/23/20    Progress Note Due on Visit 20    PT Start Time 1130    PT Stop Time 1215    PT Time Calculation (min) 45 min    Equipment Utilized During Treatment Gait belt   Pt uses power wheelchair, clinic's front-wheeled walker utilized for gait   Activity Tolerance Patient tolerated treatment well    Behavior During Therapy WFL for tasks assessed/performed             Past Medical History:  Diagnosis Date   Arthritis    GERD (gastroesophageal reflux disease)    Gout    Neuromuscular disorder ([place])    Pre-diabetes     Past Surgical History:  Procedure Laterality Date   ANTERIOR CERVICAL DECOMPRESSION/DISCECTOMY FUSION 4 LEVELS N/A 08/28/2019   Procedure: ANTERIOR CERVICAL DECOMPRESSION/DISCECTOMY FUSION CERVICALTHREE-FOUR CERVICAL,FOUR-FIVE,CERVICAL FIVE-SIX,CERVICAL SIX-SEVEN.;  Surgeon: [name], MD;  Location: [facility];  Service: Neurosurgery;  Laterality: N/A;  ANTERIOR   BACK SURGERY     CATARACT EXTRACTION W/PHACO Right 03/31/2018   Procedure: CATARACT EXTRACTION PHACO AND INTRAOCULAR LENS PLACEMENT ([place]);  Surgeon: [name], MD;  Location: [facility];  Service: Ophthalmology;  Laterality: Right;  [place] 00:39.7 CDE 4.35 Fluid Pack Lot # [id number]   CHOLECYSTECTOMY  1995   COLONOSCOPY WITH PROPOFOL N/A 06/07/2017   Procedure: COLONOSCOPY WITH PROPOFOL;  Surgeon: [name], MD;  Location[facility] END[facility]COPY;  Service: Endoscopy;  Laterality: N/A;   ESOPHAGOGASTRODUODENOSCOPY (EGD) WITH PROPOFOL N/A 06/07/2017   Procedure: ESOPHAGOGASTRODUODENOSCOPY (EGD) WITH PROPOFOL;  Surgeon: [name], MD;  Location: [facility] ENDOSCOPY;  Service: Endoscopy;  Laterality: N/A;   IR CATHETER TUBE CHANGE  05/10/2020   REDUCTION MAMMAPLASTY Bilateral 1994    There were no vitals filed for this visit.   Subjective Assessment - 01/15/21 1148     Subjective Patient reports she is doing much better with functional mobility. She is able to perform sit to stand without assist from raised bed/taller chair. Patient had botox injection yesterday for L hand and L elbow for LUE spasticity. Patient reports mild soreness in L arm at this time. Patient reports about 50% SANE score. Patient reports she is continuing to work on bed mobility. She reports difficulty with scooting on bed due to her feet sliding on the sheets. Patient reports she wants to be able to get out of bed better and improve trunk stability; she also wants overall impoved strength.    Patient is accompained by: Family member   husband   Pertinent History Patient is a 80 year old female s/p incomplete SCI. She had incomplete SCI following fall - C4 [place] D (DOI: 08/26/19). Primary activity limitations c difficulty walking, transferring, and completing independent functional mobility following incomplete SCI. She has undergone home health therapy for about one year. She and her husband reports she was able to  transfer and ambulate with walker in home with PT walking beside her. Pt has practiced with front-wheel walker at home. 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 Patient reports using steady transfer aid for performing transfers chair to bed and W/C to standard chair. Her husband states that she is able to stand with steady device and can stand with her legs against the bed. Pt has ramp to get in her home for her  power wheelchair. Patient lives in one level home. Pt uses tub bench that she can utilize to slide into shower; her daughter/husband assists with bathing. Pt has Lucianne Lei that can accommodate her power chair at this time. Pt has concrete driveway to get up to her ramp. Pt has small threshold to get into her home - husband utilizes cardboard to get over threshold.    Limitations Walking;Standing;House hold activities;Other (comment)   transferring, bed mobility, bladder management   Patient Stated Goals to be able to walk more independently               OBJECTIVE      Posture Forward head, rounded shoulders posture in chair, patient rests in posterior pelvic tilt. Improved posture with attention brought to sitting position. Patient volitionally assumes upright posture in standing without cueing.    Trunk AROM WNL Upper trunk AROM; Lower trunk: Anterior and posterior pelvic tilt WNL, able to clear ischial tuberosity from table bilaterally with hip hiking in sitting   Gait Mild forward flexed posture onto FWW with decreased gait velocity, Decrease in L>R toe clearance following fatigue (good clearance following initiation of gait and first 80 feet of ambulation), decreased heel strike L>R and decreased single-limb support time bilaterally   Upper extremity AROM Shoulder Flexion R 102, L 85 deg  Elbow extension: -15 R, -20 L Elbow flexion WFL bilaterally   Strength R/L 4/4 Shoulder flexion 4/4 Shoulder abduction 4+/4 Biceps 4+/3+ Triceps 2/2 Hip flexion 4+/4+ Hip abduction 5/5 Hip adduction 4+/4+ Knee extension 4/4 Knee flexion 4-/4- Ankle Plantarflexion 4+/4+ Ankle Dorsiflexion     FUNCTIONAL TASKS Stand Pivot Transfer: Independent for power chair placement relative to low mat. MinA for sit to stand; standby assist for pivot and stand-to-sit.    Rolling: IND positioning for initiation of rolling. ModA for active roll to R side (difficulty with LUE cross-body adduction and use  of L lower limb and trunk flexors/lateral flexors). MinA with rolling to L side. Independent with rolling sidelying to supine bilaterally.    Supine to/from sit: IND with moving lower limbs to edge of bed for supine to sit. MaxA to pull trunk from quarter-turn position in lying to sitting. IND with sitting to supine, though MaxA is required to re-position pelvis and lower limbs to facilitate scooting.            TREATMENT       Therapeutic Activities - patient education, repetitive task practice for improved performance of daily functional activities e.g. transferring  -Re-assessment performed (see above, see Goal section)   Stand pivot transfer to/from wheelchair with front-wheeled walker with MinA for completion of sit to stand, supervision for pivot, and CGA for stand to sit. IND setup of power wheelchair prior to performance of transfer; x2   Sit to stand from raised table, and from standard chair height; 1x10 each   Gait with FWW in clinic; 1 x 160 feet, around gym; CGA/supervision throughout gait         *next visit* Standing march with no UE  support on walker  2x15 alternating Staggered stance weight shift; x20 bilat LE Standing heel raise/toe raise; 2x15 alternating      *not today* Sit<>supine x 2 (via sidelying L, rolling onto back), pt requires mod A for trunk during sidelying to sit Rolling: 3 rolls in bilateral directions with VC on sequencing. Difficulty initiating roll to R with limited LUE cross-body adduction AROM; ModA for roll to L Seated march on edge of bed for improved lower limb management as needed for car and bed transfers; 20x alternating Bed mobility task practice: performed bilateral rolling with ModA for upper and lower limbs as needed to promote independent bed mobility. MaxA for L arm cross-body adduction to facilitate R rolling. MinA for L rolling.           Neuromuscular Re-education - for increased motor recruitment of major muscle groups  with motor impairment, nervous system priming and exercises to promote trunk stability    *next visit* Sitting edge of low mat, cross-body "boxing" with therapist's hand as target ("cross" punch) to facilitate active cross body adduction and trunk rotation as needed for rolling and reaching/grasping tasks; x 25 alternating  Unilateral upper extremity reaching and grasping in standing position with minimal UE support (touching on rail prn if losing balance); fine motor grasping and trunk rotation to place items into container on opposite side of body (small clothes pins, small cylinder and cubes from "HAND PT" packet); x 8 min - utilized smaller items today, cubes with 1-cm width       *not today* Seated march on Dynadisc with alternating UE/LE; x20 alternating Lower limb combined hip/knee flexion and dorsiflexion with pawing with VMS for improved dorsiflexor activation. 200 pps phase duration, ramp 1 sec, cycle time 5/5, 32 mA intensity; x 8 minutes each lower extremity Bridging; x25, partial range  Seated forward/backward trunk lean on Dynadisc; x25 Lower limb combined hip/knee flexion and dorsiflexion in unsupported sitting position, tactile cueing for upright posture and to facilitate active dorsiflexion with hip/knee flexion; x25 alternating  Seated pelvic tilt on Dynadisc; alternating hip hike; x25 alternating L/R Grasping L UE with fine motor pulp to pulp grasp, grabbing bolts and placing into adjacent container requiring trunk lean to L (in sitting on dynadisc)x 4 min Lower limb combined hip/knee flexion and dorsiflexion in unsupported sitting position, tactile cueing for upright posture and to facilitate active dorsiflexion with hip/knee flexion; x25 alternating  -Standing forward kick with active dorsiflexion with verbal cueing prior to movement for "taking foot off of gas pedal"; x20 alternating Clamshells in side lying, no resistance, BLE; x20  -Standing bilateral weight shift in  static standing with no UE support; x25 alternating Standing march, with alternating unilateral upper limb support; x20 Seated toe tapping onto Airex pad to promote active dorsiflexion during LE swing; x20 bilateral LE Pedal feet, with unilateral upper limb support; x10 with each arm holding single parallel bar  Standing with FWW with mat table behind patient -Standing hip flexion with visual and tactile cueing for increased range to improve foot clearance during gait; x10 each LE            ASSESSMENT Patient exhibits improvement in ability to perform sit to stand independently. She requires no self-assist with upper limbs with raised bed and requires RUE assist but no assist from PT with standard chair height. Patient is independent with re-positioning in her power chair. She requires no more than standby assist with stand-to-sit and standing pivot during stand-pivot transfer from chair to  mat table. She is improving in regard to bed mobility (see functional tasks section above). She exhibits improving bilateral deltoid strength and lower limb strength as demonstrated by MMTs. Patient has remaining deficits in increased tone of wrist/digit flexors (L>RUE), decreased L>R shoulder AROM, impaired trunk control, decreased LE strength and motor recruitment, gait deficits/deviations (most notably difficulty with L>R toe clearance during gait), and impaired postural stability in standing. Patient will benefit from continued skilled therapeutic intervention to address the above deficits as needed for improved function and QoL.          PT Short Term Goals - 01/15/21 1309       PT SHORT TERM GOAL #1   Title Patient will perform stand-pivot transfer from chair to bed with supervision level of assist and ModI setup of power chair as needed for transferring at home    Baseline 11/26/20: Max assist for setup and mod assist to perform sit to stand, unsteady pivot prior to performing stand to sit.     12/23/20: Patient is able to setup power chair for transfer ModI. Mod to Max assist for sit to stand. CGA level of assist for standing pivot and stand to sit on low mat.   01/15/21: Setup of power chair without assist; modified independent pivot and stand-to-sit; ModA for initial set to stand prior to pivot.    Time 4    Period Weeks    Status Partially Met    Target Date 02/12/21      PT SHORT TERM GOAL #2   Title Patient will perform standing toe tap to 3-inch surface (Airex pad) with bilateral upper extremity support indicative of improved ability to clear feet from floor during stepping and improved weight shift to each LE with AD use    Baseline 11/26/20: Limited toe clearance during bilateral LE swing phase with front-wheeled walker.   12/23/20: performed at previous follow-up with bilateral UE support.    Time 3    Period Weeks    Status Achieved    Target Date 12/17/20               PT Long Term Goals - 01/15/21 1258       PT LONG TERM GOAL #1   Title Patient will demonstrate improved function as evidenced by a score of 35 on FOTO measure for full participation in activities at home and in the community.    Baseline 11/26/20: FOTO 12.   12/23/20: FOTO 24.    01/15/21: FOTO 23    Time 16    Period Weeks    Status Partially Met    Target Date 03/18/21      PT LONG TERM GOAL #2   Title Patient will ambulate for 150 feet without LOB, with sufficient toe clearance to prevent loss of gait stability, and c proper AD placement with least-restrictive assistive device with supervision level of assist    Baseline 11/26/20: Gait x 40 feet with CGA.   12/23/20: CGA with dec toe clearance L>RLE.   01/15/21: Ambulated 160 feet without LOB, proper AD placement; good toe clearance with first 80 feet, but decrased L>R toe clearanc with fatigue.    Time 12    Period Weeks    Status Partially Met    Target Date 02/19/21      PT LONG TERM GOAL #3   Title Patient will perform independent  re-positioning in chair with use of closed-chain triceps on armrests as needed for independent wheelchair mobility with no  verbal cueing or tactile cueing required    Baseline 11/26/20: Dependent re-positioning in wheelchair.   12/23/20: Performed with feet on fold-out platform of her power wheelchair IND.    Time 8    Period Weeks    Status Achieved    Target Date 12/23/20      PT LONG TERM GOAL #4   Title Patient will perform simulated step-over task for clearing doorway threshold with use of front-wheeled walker as needed for entering/exiting home with supervision level of assistance and no LOB    Baseline 11/26/20: Pt currently uses power wheelchair to negotiate threshold into home.   12/23/20: Deferred.  01/15/21: Deferred    Time 16    Period Weeks    Status Deferred    Target Date 03/18/21                   Plan - 01/15/21 1313     Clinical Impression Statement Patient exhibits improvement in ability to perform sit to stand independently. She requires no self-assist with upper limbs with raised bed and requires RUE assist but no assist from PT with standard chair height. Patient is independent with re-positioning in her power chair. She requires no more than standby assist with stand-to-sit and standing pivot during stand-pivot transfer from chair to mat table. She is improving in regard to bed mobility (see functional tasks section above). She exhibits improving bilateral deltoid strength and lower limb strength as demonstrated by MMTs. Patient has remaining deficits in increased tone of wrist/digit flexors (L>RUE), decreased L>R shoulder AROM, impaired trunk control, decreased LE strength and motor recruitment, gait deficits/deviations (most notably difficulty with L>R toe clearance during gait), and impaired postural stability in standing. Patient will benefit from continued skilled therapeutic intervention to address the above deficits as needed for improved function and QoL.     Personal Factors and Comorbidities Age;Comorbidity 3+;Time since onset of injury/illness/exacerbation    Comorbidities GERD, pre-diabetes, obesity, gout, s/p C3-7 ACDF    Examination-Activity Limitations Bathing;Continence;Reach Overhead;Stairs;Bed Mobility;Dressing;Self Feeding;Stand;Hygiene/Grooming;Toileting;Transfers;Locomotion Level    Examination-Participation Restrictions Community Activity;Yard Work;Laundry;Cleaning;Shop    Stability/Clinical Decision Making Evolving/Moderate complexity    Rehab Potential Fair    PT Frequency Other (comment)   2-3x/week   PT Duration Other (comment)   16 weeks   PT Treatment/Interventions Electrical Stimulation;Gait training;Stair training;Functional mobility training;Therapeutic activities;Therapeutic exercise;Balance training;Neuromuscular re-education;Wheelchair mobility training;Patient/family education    PT Next Visit Plan Gait re-training, task practice for transferring, LE strengthening, UE AROM and strengthening to assist with functional mobility and transfers    PT Home Exercise Plan Pt continuing with home health exercises. Pt continuing with task practice for rolling, scooting, bridging in bed; walking with CGA with FWW; reaching and grasping with LUE.    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, initial encounter [facility])     Problem List Patient Active Problem List   Diagnosis Date Noted   Suspected deep tissue injury 10/25/2020   Wheelchair dependence 11/27/2019   Spasticity 11/27/2019    Hyperkalemia    Steroid-induced hyperglycemia    Prediabetes  Neurogenic bowel    Neurogenic bladder    Neuropathic pain    Quadriplegia ([place]) 08/31/2019   S/P cervical spinal fusion 08/28/2019   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 [place], [place], [address] Phone: [phone]   Fax:  [phone]  Name: Meredith Roach MRN: [id number] Date of Birth: 02-26-1941

## 2021-01-16 ENCOUNTER — Encounter: Payer: Self-pay | Admitting: Physical Therapy

## 2021-01-16 ENCOUNTER — Other Ambulatory Visit: Payer: Self-pay

## 2021-01-16 ENCOUNTER — Ambulatory Visit: Payer: Medicare PPO | Admitting: Physical Therapy

## 2021-01-16 DIAGNOSIS — R262 Difficulty in walking, not elsewhere classified: Secondary | ICD-10-CM

## 2021-01-16 DIAGNOSIS — S14123A 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, initial encounter: Secondary | ICD-10-CM

## 2021-01-16 DIAGNOSIS — M6281 Muscle weakness (generalized): Secondary | ICD-10-CM

## 2021-01-16 NOTE — Therapy (Signed)
[facility] [address] [place], [place], [address] Phone: [phone]   Fax:  [phone]  Physical Therapy Treatment  Patient Details  Name: Meredith Roach MRN: [id number] Date of Birth: [birth year] Referring Provider (PT): [name], MD   Encounter Date: 01/16/2021   PT End of Session - 01/16/21 1359     Visit Number 21    Number of Visits 52    Date for PT Re-Evaluation 03/18/21    Authorization Time Period Cert 8/52/78-82/42/35, last progress note 01/15/21    Progress Note Due on Visit 20    PT Start Time 1255    PT Stop Time 1346    PT Time Calculation (min) 51 min    Equipment Utilized During Treatment Gait belt   Pt uses power wheelchair, clinic's front-wheeled walker utilized for gait   Activity Tolerance Patient tolerated treatment well    Behavior During Therapy WFL for tasks assessed/performed             Past Medical History:  Diagnosis Date   Arthritis    GERD (gastroesophageal reflux disease)    Gout    Neuromuscular disorder ([place])    Pre-diabetes     Past Surgical History:  Procedure Laterality Date   ANTERIOR CERVICAL DECOMPRESSION/DISCECTOMY FUSION 4 LEVELS N/A 08/28/2019   Procedure: ANTERIOR CERVICAL DECOMPRESSION/DISCECTOMY FUSION CERVICALTHREE-FOUR CERVICAL,FOUR-FIVE,CERVICAL FIVE-SIX,CERVICAL SIX-SEVEN.;  Surgeon: [name], MD;  Location: [facility];  Service: Neurosurgery;  Laterality: N/A;  ANTERIOR   BACK SURGERY     CATARACT EXTRACTION W/PHACO Right 03/31/2018   Procedure: CATARACT EXTRACTION PHACO AND INTRAOCULAR LENS PLACEMENT ([place]);  Surgeon: [name], MD;  Location: [facility];  Service: Ophthalmology;  Laterality: Right;  [place] 00:39.7 CDE 4.35 Fluid Pack Lot # [id number]   CHOLECYSTECTOMY  1995   COLONOSCOPY WITH PROPOFOL N/A 06/07/2017   Procedure: COLONOSCOPY WITH PROPOFOL;  Surgeon: [name], MD;  Location: [facility];  Service: Endoscopy;  Laterality: N/A;    ESOPHAGOGASTRODUODENOSCOPY (EGD) WITH PROPOFOL N/A 06/07/2017   Procedure: ESOPHAGOGASTRODUODENOSCOPY (EGD) WITH PROPOFOL;  Surgeon: [name], MD;  Location: [facility];  Service: Endoscopy;  Laterality: N/A;   IR CATHETER TUBE CHANGE  05/10/2020   REDUCTION MAMMAPLASTY Bilateral 1994    There were no vitals filed for this visit.   Subjective Assessment - 01/16/21 1256     Subjective Patient reports her L hand does feel "tight" after her botox injection. Pt has been working on extension of digits at home with her board. Patient reports compliance with her HEP. Patient reports feeling generally well compared to "tightness" she was experiencing over the last week in L arm/hand and L lower limb.    Patient is accompained by: Family member   husband   Pertinent History Patient is a 80 year old female s/p incomplete SCI. She had incomplete SCI following fall - C4 [place] D (DOI: 08/26/19). Primary activity limitations c difficulty walking, transferring, and completing independent functional mobility following incomplete SCI. She has undergone home health therapy for about one year. She and her husband reports she was able to transfer and ambulate with walker in home with PT walking beside her. Pt has practiced with front-wheel walker at home. 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 Patient reports using steady transfer aid for performing transfers chair to bed and W/C to standard chair. Her husband states that she is able to stand with steady device and can stand with her legs against the bed. Pt  has ramp to get in her home for her power wheelchair. Patient lives in one level home. Pt uses tub bench that she can utilize to slide into shower; her daughter/husband assists with bathing. Pt has Lucianne Lei that can accommodate her power chair at this time. Pt has concrete driveway to get up to her ramp. Pt has small threshold to get into her home - husband utilizes cardboard to get over threshold.     Limitations Walking;Standing;House hold activities;Other (comment)   transferring, bed mobility, bladder management   Patient Stated Goals to be able to walk more independently    Currently in Pain? No/denies                  TREATMENT       Therapeutic Activities - patient education, repetitive task practice for improved performance of daily functional activities e.g. transferring    Stand pivot transfer to/from wheelchair with front-wheeled walker with MinA for completion of sit to stand, supervision for pivot, and supervision for stand to sit. IND setup of power wheelchair prior to performance of transfer; x2   Sit to stand from raised table, 20" height; 2x10 each   Gait with FWW in clinic; 1 x 120 feet, around gym; CGA/supervision throughout gait     In // bars: Staggered stance weight shift; x20 bilat LE Standing heel raise/toe raise; x20 alternating   Gait from // bars to power chair across clinic, including retrostep x 4 feet and standing pivot to exit // bars, ambulation x 30 feet to return to chair      *next visit* Standing march with no UE support on walker  2x15 alternating         *not today* Sit<>supine x 2 (via sidelying L, rolling onto back), pt requires mod A for trunk during sidelying to sit Rolling: 3 rolls in bilateral directions with VC on sequencing. Difficulty initiating roll to R with limited LUE cross-body adduction AROM; ModA for roll to L Seated march on edge of bed for improved lower limb management as needed for car and bed transfers; 20x alternating Bed mobility task practice: performed bilateral rolling with ModA for upper and lower limbs as needed to promote independent bed mobility. MaxA for L arm cross-body adduction to facilitate R rolling. MinA for L rolling.             Neuromuscular Re-education - for increased motor recruitment of major muscle groups with motor impairment, nervous system priming and exercises to promote  trunk stability     Seated march on Dynadisc with alternating UE/LE; x20 alternating  Sitting on Dynadisc, cross-body "boxing" with sliders in therapist's hands as target ("cross" punch) to facilitate active cross body adduction and trunk rotation as needed for rolling and reaching/grasping tasks (including low and high punches today); x 25 alternating   In // bars: Unilateral upper extremity reaching and grasping in standing position with minimal UE support (touching on rail prn if losing balance); fine motor grasping and trunk rotation to place items into container on opposite side of body (small clothes pins, small cylinder and cubes from "HAND PT" packet); x 5 min - utilized smaller items today, cubes with 1-cm width       *not today* Lower limb combined hip/knee flexion and dorsiflexion with pawing with VMS for improved dorsiflexor activation. 200 pps phase duration, ramp 1 sec, cycle time 5/5, 32 mA intensity; x 8 minutes each lower extremity Bridging; x25, partial range  Seated forward/backward trunk lean on  Dynadisc; x25 Lower limb combined hip/knee flexion and dorsiflexion in unsupported sitting position, tactile cueing for upright posture and to facilitate active dorsiflexion with hip/knee flexion; x25 alternating  Seated pelvic tilt on Dynadisc; alternating hip hike; x25 alternating L/R Grasping L UE with fine motor pulp to pulp grasp, grabbing bolts and placing into adjacent container requiring trunk lean to L (in sitting on dynadisc)x 4 min Lower limb combined hip/knee flexion and dorsiflexion in unsupported sitting position, tactile cueing for upright posture and to facilitate active dorsiflexion with hip/knee flexion; x25 alternating  -Standing forward kick with active dorsiflexion with verbal cueing prior to movement for "taking foot off of gas pedal"; x20 alternating Clamshells in side lying, no resistance, BLE; x20  -Standing bilateral weight shift in static standing with no  UE support; x25 alternating Standing march, with alternating unilateral upper limb support; x20 Seated toe tapping onto Airex pad to promote active dorsiflexion during LE swing; x20 bilateral LE Pedal feet, with unilateral upper limb support; x10 with each arm holding single parallel bar  Standing with FWW with mat table behind patient -Standing hip flexion with visual and tactile cueing for increased range to improve foot clearance during gait; x10 each LE             ASSESSMENT Patient exhibits improving ability to perform independent sit to stand as demonstrated by performance of sit to stand from 20-inch surface, lower than her power chair; this was performed with standby assist without LOB and no UE support for majority of repetitions. She was able to demonstrate retro-stepping and forward ambulation in clinic with FWW without LOB with contact-guard assist. Patient demonstrates sound toe clearance during ambulation until she is significantly fatigued. Pt is continuing to improve with capacity for performing reaching and grasping as demonstrated by manipulation of objects of various sizes and shapes with left upper limb. Patient has remaining deficits in increased tone of wrist/digit flexors (L>RUE), decreased L>R shoulder AROM, impaired trunk control, decreased LE strength and motor recruitment, gait deficits/deviations (most notably difficulty with L>R toe clearance during gait), and impaired postural stability in standing. Patient will benefit from continued skilled therapeutic intervention to address the above deficits as needed for improved function and QoL.       PT Short Term Goals - 01/15/21 1309       PT SHORT TERM GOAL #1   Title Patient will perform stand-pivot transfer from chair to bed with supervision level of assist and ModI setup of power chair as needed for transferring at home    Baseline 11/26/20: Max assist for setup and mod assist to perform sit to stand, unsteady pivot  prior to performing stand to sit.    12/23/20: Patient is able to setup power chair for transfer ModI. Mod to Max assist for sit to stand. CGA level of assist for standing pivot and stand to sit on low mat.   01/15/21: Setup of power chair without assist; modified independent pivot and stand-to-sit; ModA for initial set to stand prior to pivot.    Time 4    Period Weeks    Status Partially Met    Target Date 02/12/21      PT SHORT TERM GOAL #2   Title Patient will perform standing toe tap to 3-inch surface (Airex pad) with bilateral upper extremity support indicative of improved ability to clear feet from floor during stepping and improved weight shift to each LE with AD use    Baseline 11/26/20: Limited toe clearance during  bilateral LE swing phase with front-wheeled walker.   12/23/20: performed at previous follow-up with bilateral UE support.    Time 3    Period Weeks    Status Achieved    Target Date 12/17/20               PT Long Term Goals - 01/15/21 1258       PT LONG TERM GOAL #1   Title Patient will demonstrate improved function as evidenced by a score of 35 on FOTO measure for full participation in activities at home and in the community.    Baseline 11/26/20: FOTO 12.   12/23/20: FOTO 24.    01/15/21: FOTO 23    Time 16    Period Weeks    Status Partially Met    Target Date 03/18/21      PT LONG TERM GOAL #2   Title Patient will ambulate for 150 feet without LOB, with sufficient toe clearance to prevent loss of gait stability, and c proper AD placement with least-restrictive assistive device with supervision level of assist    Baseline 11/26/20: Gait x 40 feet with CGA.   12/23/20: CGA with dec toe clearance L>RLE.   01/15/21: Ambulated 160 feet without LOB, proper AD placement; good toe clearance with first 80 feet, but decrased L>R toe clearanc with fatigue.    Time 12    Period Weeks    Status Partially Met    Target Date 02/19/21      PT LONG TERM GOAL #3   Title Patient  will perform independent re-positioning in chair with use of closed-chain triceps on armrests as needed for independent wheelchair mobility with no verbal cueing or tactile cueing required    Baseline 11/26/20: Dependent re-positioning in wheelchair.   12/23/20: Performed with feet on fold-out platform of her power wheelchair IND.    Time 8    Period Weeks    Status Achieved    Target Date 12/23/20      PT LONG TERM GOAL #4   Title Patient will perform simulated step-over task for clearing doorway threshold with use of front-wheeled walker as needed for entering/exiting home with supervision level of assistance and no LOB    Baseline 11/26/20: Pt currently uses power wheelchair to negotiate threshold into home.   12/23/20: Deferred.  01/15/21: Deferred    Time 16    Period Weeks    Status Deferred    Target Date 03/18/21                   Plan - 01/16/21 1408     Clinical Impression Statement Patient exhibits improving ability to perform independent sit to stand as demonstrated by performance of sit to stand from 20-inch surface, lower than her power chair; this was performed with standby assist without LOB and no UE support for majority of repetitions. She was able to demonstrate retrostepping and forward ambulation in clinic with FWW without LOB with contact-guard assist. Patient demonstrates sound toe clearance during ambulation until she is significantly fatigued. Pt is continuing to improve with capacity for performing reaching and grasping as demonstrated by manipulation of objects of various sizes and shapes with left upper limb. Patient has remaining deficits in increased tone of wrist/digit flexors (L>RUE), decreased L>R shoulder AROM, impaired trunk control, decreased LE strength and motor recruitment, gait deficits/deviations (most notably difficulty with L>R toe clearance during gait), and impaired postural stability in standing. Patient will benefit from continued skilled  therapeutic intervention  to address the above deficits as needed for improved function and QoL.    Personal Factors and Comorbidities Age;Comorbidity 3+;Time since onset of injury/illness/exacerbation    Comorbidities GERD, pre-diabetes, obesity, gout, s/p C3-7 ACDF    Examination-Activity Limitations Bathing;Continence;Reach Overhead;Stairs;Bed Mobility;Dressing;Self Feeding;Stand;Hygiene/Grooming;Toileting;Transfers;Locomotion Level    Examination-Participation Restrictions Community Activity;Yard Work;Laundry;Cleaning;Shop    Stability/Clinical Decision Making Evolving/Moderate complexity    Rehab Potential Fair    PT Frequency Other (comment)   2-3x/week   PT Duration Other (comment)   16 weeks   PT Treatment/Interventions Electrical Stimulation;Gait training;Stair training;Functional mobility training;Therapeutic activities;Therapeutic exercise;Balance training;Neuromuscular re-education;Wheelchair mobility training;Patient/family education    PT Next Visit Plan Gait re-training, task practice for transferring, LE strengthening, UE AROM and strengthening to assist with functional mobility and transfers    PT Home Exercise Plan Pt continuing with home health exercises. Pt continuing with task practice for rolling, scooting, bridging in bed; walking with CGA with FWW; reaching and grasping with LUE.    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, initial encounter [facility])     Problem List Patient Active Problem List   Diagnosis Date Noted    Suspected deep tissue injury 10/25/2020   Wheelchair dependence 11/27/2019   Spasticity 11/27/2019   Hyperkalemia    Steroid-induced hyperglycemia    Prediabetes    Neurogenic bowel    Neurogenic bladder    Neuropathic pain    Quadriplegia ([place]) 08/31/2019   S/P cervical spinal fusion 08/28/2019   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 [place], [place], [address] Phone: [phone]   Fax:  [phone]  Name: MARSA MATTEO MRN: [id number] Date of Birth: February 18, 1941

## 2021-01-19 NOTE — Progress Notes (Signed)
Suprapubic Cath Change  Patient is present today for a suprapubic catheter change due to urinary retention.  9 ml of water was drained from the balloon, a 16 FR foley cath was removed from the tract with out difficulty.  Site was cleaned and prepped in a sterile fashion with betadine.  A 16 FR foley cath was replaced into the tract no complications were noted. Urine return was noted, 10 ml of sterile water was inflated into the balloon.  Patient tolerated well. A night bag was attached to patient and proper instruction was given on how to switch bags.    Performed by: Zara Council, PA-C  Follow up: One month for SPT exchange

## 2021-01-20 ENCOUNTER — Ambulatory Visit: Payer: Medicare PPO | Admitting: Physical Therapy

## 2021-01-20 ENCOUNTER — Encounter: Payer: Self-pay | Admitting: Physical Therapy

## 2021-01-20 ENCOUNTER — Other Ambulatory Visit: Payer: Self-pay

## 2021-01-20 ENCOUNTER — Encounter: Payer: Self-pay | Admitting: Urology

## 2021-01-20 ENCOUNTER — Ambulatory Visit: Payer: Medicare PPO | Admitting: Urology

## 2021-01-20 VITALS — BP 117/75 | HR 86 | Ht 63.0 in | Wt 200.0 lb

## 2021-01-20 DIAGNOSIS — S14123A 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, initial encounter: Secondary | ICD-10-CM

## 2021-01-20 DIAGNOSIS — M6281 Muscle weakness (generalized): Secondary | ICD-10-CM

## 2021-01-20 DIAGNOSIS — Z9359 Other cystostomy status: Secondary | ICD-10-CM | POA: Diagnosis not present

## 2021-01-20 DIAGNOSIS — R262 Difficulty in walking, not elsewhere classified: Secondary | ICD-10-CM | POA: Diagnosis not present

## 2021-01-20 NOTE — Therapy (Signed)
[facility] [address] [place], [place], [address] Phone: [phone]   Fax:  [phone]  Physical Therapy Treatment  Patient Details  Name: Meredith Roach MRN: [id number] Date of Birth: 1941-02-19 Referring Provider (PT): [name], MD   Encounter Date: 01/20/2021   PT End of Session - 01/20/21 1129     Visit Number 22    Number of Visits 6    Date for PT Re-Evaluation 03/18/21    Authorization Time Period Cert 5/64/33-29/51/88, last progress note 01/15/21    Progress Note Due on Visit 30    PT Start Time 1145    PT Stop Time 1230    PT Time Calculation (min) 45 min    Equipment Utilized During Treatment Gait belt   Pt uses power wheelchair, clinic's front-wheeled walker utilized for gait   Activity Tolerance Patient tolerated treatment well    Behavior During Therapy [facility] for tasks assessed/performed             Past Medical History:  Diagnosis Date   Arthritis    GERD (gastroesophageal reflux disease)    Gout    Neuromuscular disorder ([place])    Pre-diabetes     Past Surgical History:  Procedure Laterality Date   ANTERIOR CERVICAL DECOMPRESSION/DISCECTOMY FUSION 4 LEVELS N/A 08/28/2019   Procedure: ANTERIOR CERVICAL DECOMPRESSION/DISCECTOMY FUSION CERVICALTHREE-FOUR CERVICAL,FOUR-FIVE,CERVICAL FIVE-SIX,CERVICAL SIX-SEVEN.;  Surgeon: [name], MD;  Location: [facility];  Service: Neurosurgery;  Laterality: N/A;  ANTERIOR   BACK SURGERY     CATARACT EXTRACTION W/PHACO Right 03/31/2018   Procedure: CATARACT EXTRACTION PHACO AND INTRAOCULAR LENS PLACEMENT ([place]);  Surgeon: [name], MD;  Location: [facility];  Service: Ophthalmology;  Laterality: Right;  [place] 00:39.7 CDE 4.35 Fluid Pack Lot # [id number]   CHOLECYSTECTOMY  1995   COLONOSCOPY WITH PROPOFOL N/A 06/07/2017   Procedure: COLONOSCOPY WITH PROPOFOL;  Surgeon: [name], MD;  Location: [facility] ENDOSCOPY;  Service: Endoscopy;  Laterality: N/A;    ESOPHAGOGASTRODUODENOSCOPY (EGD) WITH PROPOFOL N/A 06/07/2017   Procedure: ESOPHAGOGASTRODUODENOSCOPY (EGD) WITH PROPOFOL;  Surgeon: [name], MD;  Location: [facility] ENDOSCOPY;  Service: Endoscopy;  Laterality: N/A;   IR CATHETER TUBE CHANGE  05/10/2020   REDUCTION MAMMAPLASTY Bilateral 1994    There were no vitals filed for this visit.   Subjective Assessment - 01/20/21 1251     Subjective Patient feels that her L hand is able to open into extended position (digits extended to neural/anatomical position) better following recent Botox injection. She reports still feeling "tight" in her L hand. Patient reports positive feedback from her daughter with improved indepenence with sit to stand from her power chair and transferring into her shower with Stedy device. Patient reports continued work on bed mobility and bridging at home; consistent compliance with HEP.    Patient is accompained by: Family member   husband   Pertinent History Patient is a 80 year old female s/p incomplete SCI. She had incomplete SCI following fall - C4 [place] D (DOI: 08/26/19). Primary activity limitations c difficulty walking, transferring, and completing independent functional mobility following incomplete SCI. She has undergone home health therapy for about one year. She and her husband reports she was able to transfer and ambulate with walker in home with PT walking beside her. Pt has practiced with front-wheel walker at home. 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 Patient reports using steady transfer aid for performing transfers chair to bed and W/C to standard chair. Her husband  states that she is able to stand with steady device and can stand with her legs against the bed. Pt has ramp to get in her home for her power wheelchair. Patient lives in one level home. Pt uses tub bench that she can utilize to slide into shower; her daughter/husband assists with bathing. Pt has Lucianne Lei that can accommodate her power  chair at this time. Pt has concrete driveway to get up to her ramp. Pt has small threshold to get into her home - husband utilizes cardboard to get over threshold.    Limitations Walking;Standing;House hold activities;Other (comment)   transferring, bed mobility, bladder management   Patient Stated Goals to be able to walk more independently                TREATMENT       Therapeutic Activities - patient education, repetitive task practice for improved performance of daily functional activities e.g. transferring   Sit to stand and gait to edge of table with modified-independent stand to sit prior to initiation of sitting/lying exercises ambulation x 8 feet with R 90-degree turn  Sit<>supine x 2 (via sidelying R, rolling onto back), pt requires mod A for trunk during sidelying to sit Rolling: 5 rolls in bilateral directions with VC on sequencing. Difficulty initiating roll to R with limited LUE cross-body adduction AROM; ModA for roll to L    Gait with FWW in clinic; 1 x 120 feet, around gym; CGA/supervision throughout gait   Gait from // bars to power chair across clinic, including retrostep x 4 feet and standing pivot to exit // bars, ambulation x 30 feet to return to chair      *next visit* Standing march with no UE support on walker  2x15 alternating          *not today* In // bars: Sit to stand from raised table, 20" height; 2x10 each Staggered stance weight shift; x20 bilat LE Standing heel raise/toe raise; x20 alternating Stand pivot transfer to/from wheelchair with front-wheeled walker with ModA for completion of sit to stand, supervision for pivot, and supervision for stand to sit. IND setup of power wheelchair prior to performance of transfer; x2 Seated march on edge of bed for improved lower limb management as needed for car and bed transfers; 20x alternating Bed mobility task practice: performed bilateral rolling with ModA for upper and lower limbs as needed to  promote independent bed mobility. MaxA for L arm cross-body adduction to facilitate R rolling. MinA for L rolling.             Neuromuscular Re-education - for increased motor recruitment of major muscle groups with motor impairment, nervous system priming and exercises to promote trunk stability      Seated march on Dynadisc with alternating UE/LE; x20 alternating   Sitting on Dynadisc, cross-body "boxing" with sliders in therapist's hands as target ("cross" punch) to facilitate active cross body adduction and trunk rotation as needed for rolling and reaching/grasping tasks (including low and high punches today); x 25 alternating     In // bars: Unilateral upper extremity reaching and grasping in standing position with minimal UE support (touching on rail prn if losing balance); fine motor grasping and trunk rotation to place items into container on opposite side of body (small clothes pins, small cylinder and cubes of various sizes, spheres - from "HAND PT" packet); x 6 min    Seated toe tapping onto Airex pad to promote active dorsiflexion during LE swing; x20  bilateral LE      *not today* Lower limb combined hip/knee flexion and dorsiflexion with pawing with VMS for improved dorsiflexor activation. 200 pps phase duration, ramp 1 sec, cycle time 5/5, 32 mA intensity; x 8 minutes each lower extremity Bridging; x25, partial range  Seated forward/backward trunk lean on Dynadisc; x25 Lower limb combined hip/knee flexion and dorsiflexion in unsupported sitting position, tactile cueing for upright posture and to facilitate active dorsiflexion with hip/knee flexion; x25 alternating  Seated pelvic tilt on Dynadisc; alternating hip hike; x25 alternating L/R Grasping L UE with fine motor pulp to pulp grasp, grabbing bolts and placing into adjacent container requiring trunk lean to L (in sitting on dynadisc)x 4 min Lower limb combined hip/knee flexion and dorsiflexion in unsupported sitting  position, tactile cueing for upright posture and to facilitate active dorsiflexion with hip/knee flexion; x25 alternating  -Standing forward kick with active dorsiflexion with verbal cueing prior to movement for "taking foot off of gas pedal"; x20 alternating Clamshells in side lying, no resistance, BLE; x20  -Standing bilateral weight shift in static standing with no UE support; x25 alternating Standing march, with alternating unilateral upper limb support; x20 Pedal feet, with unilateral upper limb support; x10 with each arm holding single parallel bar  Standing with FWW with mat table behind patient -Standing hip flexion with visual and tactile cueing for increased range to improve foot clearance during gait; x10 each LE             ASSESSMENT Patient exhibits ongoing difficulties with rolling and supine to/from sit with intermittent ModA required for positioning of lower extremities due to weakness with OKC hip flexion and decreased trunk control/truncal strength. Patient is able to complete roll to each direction with facilitation/ModA for cross-body adduction of UE (contralateral UE relative to direction of roll). She is able to continue progressive standing activity and demonstrates sound toe clearance with toe tapping drill in parallel bars today. Patient's performance of reaching and grasping is significantly improving with patient requiring no assist to grab all items today of various shapes and sizes (cubes, cylinders, paper clips, spheres). Patient has remaining deficits in increased tone of wrist/digit flexors (L>RUE), decreased L>R shoulder AROM, impaired trunk control, decreased LE strength and motor recruitment, gait deficits/deviations (most notably difficulty with L>R toe clearance during gait), and impaired postural stability in standing. Patient will benefit from continued skilled therapeutic intervention to address the above deficits as needed for improved function and QoL.          PT Short Term Goals - 01/15/21 1309       PT SHORT TERM GOAL #1   Title Patient will perform stand-pivot transfer from chair to bed with supervision level of assist and ModI setup of power chair as needed for transferring at home    Baseline 11/26/20: Max assist for setup and mod assist to perform sit to stand, unsteady pivot prior to performing stand to sit.    12/23/20: Patient is able to setup power chair for transfer ModI. Mod to Max assist for sit to stand. CGA level of assist for standing pivot and stand to sit on low mat.   01/15/21: Setup of power chair without assist; modified independent pivot and stand-to-sit; ModA for initial set to stand prior to pivot.    Time 4    Period Weeks    Status Partially Met    Target Date 02/12/21      PT SHORT TERM GOAL #2   Title Patient will  perform standing toe tap to 3-inch surface (Airex pad) with bilateral upper extremity support indicative of improved ability to clear feet from floor during stepping and improved weight shift to each LE with AD use    Baseline 11/26/20: Limited toe clearance during bilateral LE swing phase with front-wheeled walker.   12/23/20: performed at previous follow-up with bilateral UE support.    Time 3    Period Weeks    Status Achieved    Target Date 12/17/20               PT Long Term Goals - 01/15/21 1258       PT LONG TERM GOAL #1   Title Patient will demonstrate improved function as evidenced by a score of 35 on FOTO measure for full participation in activities at home and in the community.    Baseline 11/26/20: FOTO 12.   12/23/20: FOTO 24.    01/15/21: FOTO 23    Time 16    Period Weeks    Status Partially Met    Target Date 03/18/21      PT LONG TERM GOAL #2   Title Patient will ambulate for 150 feet without LOB, with sufficient toe clearance to prevent loss of gait stability, and c proper AD placement with least-restrictive assistive device with supervision level of assist    Baseline  11/26/20: Gait x 40 feet with CGA.   12/23/20: CGA with dec toe clearance L>RLE.   01/15/21: Ambulated 160 feet without LOB, proper AD placement; good toe clearance with first 80 feet, but decrased L>R toe clearanc with fatigue.    Time 12    Period Weeks    Status Partially Met    Target Date 02/19/21      PT LONG TERM GOAL #3   Title Patient will perform independent re-positioning in chair with use of closed-chain triceps on armrests as needed for independent wheelchair mobility with no verbal cueing or tactile cueing required    Baseline 11/26/20: Dependent re-positioning in wheelchair.   12/23/20: Performed with feet on fold-out platform of her power wheelchair IND.    Time 8    Period Weeks    Status Achieved    Target Date 12/23/20      PT LONG TERM GOAL #4   Title Patient will perform simulated step-over task for clearing doorway threshold with use of front-wheeled walker as needed for entering/exiting home with supervision level of assistance and no LOB    Baseline 11/26/20: Pt currently uses power wheelchair to negotiate threshold into home.   12/23/20: Deferred.  01/15/21: Deferred    Time 16    Period Weeks    Status Deferred    Target Date 03/18/21                   Plan - 01/20/21 1325     Clinical Impression Statement Patient exhibits ongoing difficulties with rolling and supine to/from sit with intermittent ModA required for positioning of lower extremities due to weakness with OKC hip flexion and decreased trunk control/truncal strength. Patient is able to complete roll to each direction with facilitation/ModA for cross-body adduction of UE (contralateral UE relative to direction of roll). She is able to continue progressive standing activity and demonstrates sound toe clearance with toe tapping drill in parallel bars today. Patient's performance of reaching and grasping is significantly improving with patient requiring no assist to grab all items today of various shapes  and sizes (cubes, cylinders, paper clips, spheres).  Patient has remaining deficits in increased tone of wrist/digit flexors (L>RUE), decreased L>R shoulder AROM, impaired trunk control, decreased LE strength and motor recruitment, gait deficits/deviations (most notably difficulty with L>R toe clearance during gait), and impaired postural stability in standing. Patient will benefit from continued skilled therapeutic intervention to address the above deficits as needed for improved function and QoL.    Personal Factors and Comorbidities Age;Comorbidity 3+;Time since onset of injury/illness/exacerbation    Comorbidities GERD, pre-diabetes, obesity, gout, s/p C3-7 ACDF    Examination-Activity Limitations Bathing;Continence;Reach Overhead;Stairs;Bed Mobility;Dressing;Self Feeding;Stand;Hygiene/Grooming;Toileting;Transfers;Locomotion Level    Examination-Participation Restrictions Community Activity;Yard Work;Laundry;Cleaning;Shop    Stability/Clinical Decision Making Evolving/Moderate complexity    Rehab Potential Fair    PT Frequency Other (comment)   2-3x/week   PT Duration Other (comment)   16 weeks   PT Treatment/Interventions Electrical Stimulation;Gait training;Stair training;Functional mobility training;Therapeutic activities;Therapeutic exercise;Balance training;Neuromuscular re-education;Wheelchair mobility training;Patient/family education    PT Next Visit Plan Gait re-training, task practice for transferring, LE strengthening, UE AROM and strengthening to assist with functional mobility and transfers    PT Home Exercise Plan Pt continuing with home health exercises. Pt continuing with task practice for rolling, scooting, bridging in bed; walking with CGA with FWW; reaching and grasping with LUE.    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, initial encounter [facility])     Problem List Patient Active Problem List   Diagnosis Date Noted   Suspected deep tissue injury 10/25/2020   Wheelchair dependence 11/27/2019   Spasticity 11/27/2019   Hyperkalemia    Steroid-induced hyperglycemia    Prediabetes    Neurogenic bowel    Neurogenic bladder    Neuropathic pain    Quadriplegia ([place]) 08/31/2019   S/P cervical spinal fusion 08/28/2019   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 [place], [place], [address] Phone: [phone]   Fax:  [phone]  Name: Meredith Roach MRN: [id number] Date of Birth: 08/05/1940

## 2021-01-22 ENCOUNTER — Other Ambulatory Visit: Payer: Self-pay

## 2021-01-22 ENCOUNTER — Ambulatory Visit: Payer: Medicare PPO | Admitting: Physical Therapy

## 2021-01-22 DIAGNOSIS — R262 Difficulty in walking, not elsewhere classified: Secondary | ICD-10-CM

## 2021-01-22 DIAGNOSIS — M6281 Muscle weakness (generalized): Secondary | ICD-10-CM

## 2021-01-22 DIAGNOSIS — S14123A 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, initial encounter: Secondary | ICD-10-CM

## 2021-01-22 NOTE — Therapy (Signed)
[facility] [address] [place], [place], [address] Phone: [phone]   Fax:  [phone]  Physical Therapy Treatment  Patient Details  Name: Meredith Roach MRN: [id number] Date of Birth: 30-Dec-1940 Referring Provider (PT): [name], MD   Encounter Date: 01/22/2021   PT End of Session - 01/22/21 1320     Visit Number 23    Number of Visits 76    Date for PT Re-Evaluation 03/18/21    Authorization Time Period Cert 0/94/70-96/28/36, last progress note 01/15/21    Progress Note Due on Visit 30    PT Start Time 1143    PT Stop Time 1234    PT Time Calculation (min) 51 min    Equipment Utilized During Treatment Gait belt   Pt uses power wheelchair, clinic's front-wheeled walker utilized for gait   Activity Tolerance Patient tolerated treatment well    Behavior During Therapy WFL for tasks assessed/performed             Past Medical History:  Diagnosis Date   Arthritis    GERD (gastroesophageal reflux disease)    Gout    Neuromuscular disorder ([place])    Pre-diabetes     Past Surgical History:  Procedure Laterality Date   ANTERIOR CERVICAL DECOMPRESSION/DISCECTOMY FUSION 4 LEVELS N/A 08/28/2019   Procedure: ANTERIOR CERVICAL DECOMPRESSION/DISCECTOMY FUSION CERVICALTHREE-FOUR CERVICAL,FOUR-FIVE,CERVICAL FIVE-SIX,CERVICAL SIX-SEVEN.;  Surgeon: [name], MD;  Location: [place];  Service: Neurosurgery;  Laterality: N/A;  ANTERIOR   BACK SURGERY     CATARACT EXTRACTION W/PHACO Right 03/31/2018   Procedure: CATARACT EXTRACTION PHACO AND INTRAOCULAR LENS PLACEMENT ([place]);  Surgeon: [name], MD;  Location: [facility];  Service: Ophthalmology;  Laterality: Right;  [place] 00:39.7 CDE 4.35 Fluid Pack Lot # [id number]   CHOLECYSTECTOMY  1995   COLONOSCOPY WITH PROPOFOL N/A 06/07/2017   Procedure: COLONOSCOPY WITH PROPOFOL;  Surgeon: [name], MD;  [facility] END[facility];  Service: Endoscopy;  Laterality: N/A;    ESOPHAGOGASTRODUODENOSCOPY (EGD) WITH PROPOFOL N/A 06/07/2017   Procedure: ESOPHAGOGASTRODUODENOSCOPY (EGD) WITH PROPOFOL;  Surgeon: [name], MD;  Location: [facility]COPY;  Service: Endoscopy;  Laterality: N/A;   IR CATHETER TUBE CHANGE  05/10/2020   REDUCTION MAMMAPLASTY Bilateral 1994    There were no vitals filed for this visit.   Subjective Assessment - 01/23/21 1224     Subjective Patient reports feeling generally well at arrival to PT. She is continuing aggressive work on home exercise including sit to stands, gait with FWW, bed mobility and bridging work, trunk AROM in sitting, and upper limb ROM exercises given by OT. Patient reports moderate "stiffness" in L lower limb that improves with movement.    Patient is accompained by: Family member   husband   Pertinent History Patient is a 80 year old female s/p incomplete SCI. She had incomplete SCI following fall - C4 [place] D (DOI: 08/26/19). Primary activity limitations c difficulty walking, transferring, and completing independent functional mobility following incomplete SCI. She has undergone home health therapy for about one year. She and her husband reports she was able to transfer and ambulate with walker in home with PT walking beside her. Pt has practiced with front-wheel walker at home. 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 Patient reports using steady transfer aid for performing transfers chair to bed and W/C to standard chair. Her husband states that she is able to stand with steady device and can stand with her legs against the bed. Pt  has ramp to get in her home for her power wheelchair. Patient lives in one level home. Pt uses tub bench that she can utilize to slide into shower; her daughter/husband assists with bathing. Pt has Lucianne Lei that can accommodate her power chair at this time. Pt has concrete driveway to get up to her ramp. Pt has small threshold to get into her home - husband utilizes cardboard to get over  threshold.    Limitations Walking;Standing;House hold activities;Other (comment)   transferring, bed mobility, bladder management   Patient Stated Goals to be able to walk more independently                   TREATMENT       Therapeutic Activities - patient education, repetitive task practice for improved performance of daily functional activities e.g. transferring   Sit to stand and gait to edge of table with modified-independent stand to sit prior to initiation of sitting/lying exercises; ambulation x 20 feet with 180-degree turn prior to sitting edge of low mat   Gait with FWW in clinic; 1 x 120 feet, around gym; CGA/supervision throughout gait    In // bars: Standing march with UE support on walker  2x10 alternating    Gait from // bars to power chair across clinic, including retrostep x 4 feet and standing pivot to exit // bars, ambulation x 30 feet to return to chair       *not today* Sit<>supine x 2 (via sidelying R, rolling onto back), pt requires mod A for trunk during sidelying to sit Rolling: 5 rolls in bilateral directions with VC on sequencing. Difficulty initiating roll to R with limited LUE cross-body adduction AROM; ModA for roll to L In // bars: Sit to stand from raised table, 20" height; 2x10 each Staggered stance weight shift; x20 bilat LE Standing heel raise/toe raise; x20 alternating Stand pivot transfer to/from wheelchair with front-wheeled walker with ModA for completion of sit to stand, supervision for pivot, and supervision for stand to sit. IND setup of power wheelchair prior to performance of transfer; x2 Bed mobility task practice: performed bilateral rolling with ModA for upper and lower limbs as needed to promote independent bed mobility. MaxA for L arm cross-body adduction to facilitate R rolling. MinA for L rolling.             Neuromuscular Re-education - for increased motor recruitment of major muscle groups with motor impairment,  nervous system priming and exercises to promote trunk stability      Seated march on Dynadisc with alternating UE/LE; x20 alternating   Sitting on Dynadisc, catching balloon and balloon tapping back to therapist - to facilitate active cross body adduction and trunk rotation as needed for rolling and reaching/grasping tasks (including low and high punches reaching today); x 1 minute catching, x 2 minutes balloon tapping       In // bars: Unilateral upper extremity reaching and grasping in standing position with minimal UE support (touching on rail prn if losing balance); fine motor grasping and trunk rotation to place items into container on opposite side of body (small clothes pins, small cylinder and cubes of various sizes, spheres - from "HAND PT" packet); x 2 min    Seated toe tapping onto Airex pad to promote active dorsiflexion during LE swing; x20 bilateral LE         *not today* Sitting on Dynadisc, cross-body "boxing" with sliders in therapist's hands as target ("cross" punch) to facilitate active cross  body adduction and trunk rotation as needed for rolling and reaching/grasping tasks (including low and high punches today); x 25 alternating Lower limb combined hip/knee flexion and dorsiflexion with pawing with VMS for improved dorsiflexor activation. 200 pps phase duration, ramp 1 sec, cycle time 5/5, 32 mA intensity; x 8 minutes each lower extremity Bridging; x25, partial range  Seated forward/backward trunk lean on Dynadisc; x25 Lower limb combined hip/knee flexion and dorsiflexion in unsupported sitting position, tactile cueing for upright posture and to facilitate active dorsiflexion with hip/knee flexion; x25 alternating  Seated pelvic tilt on Dynadisc; alternating hip hike; x25 alternating L/R Grasping L UE with fine motor pulp to pulp grasp, grabbing bolts and placing into adjacent container requiring trunk lean to L (in sitting on dynadisc)x 4 min Lower limb combined  hip/knee flexion and dorsiflexion in unsupported sitting position, tactile cueing for upright posture and to facilitate active dorsiflexion with hip/knee flexion; x25 alternating  -Standing forward kick with active dorsiflexion with verbal cueing prior to movement for "taking foot off of gas pedal"; x20 alternating Clamshells in side lying, no resistance, BLE; x20  -Standing bilateral weight shift in static standing with no UE support; x25 alternating Standing march, with alternating unilateral upper limb support; x20 Pedal feet, with unilateral upper limb support; x10 with each arm holding single parallel bar  Standing with FWW with mat table behind patient -Standing hip flexion with visual and tactile cueing for increased range to improve foot clearance during gait; x10 each LE             ASSESSMENT Patient is significantly challenged with attempted march with no UE support and unilateral UE support. She exhibits sufficient combined hip/knee flexion and ankle dorsiflexion to perform toe tap on Airex. She is improving in prehensile skills with patient able to grab various shapes/sizes with left upper limb. She is still significantly limited in horizontal adduction and shoulder elevation AROM in L>RUE. She demonstrates ongoing improvement in toe clearance during gait - toe drag is mainly apparent following significant fatigue (in L > R lower limb). Patient has remaining deficits in increased tone of wrist/digit flexors (L>RUE), decreased L>R shoulder AROM, impaired trunk control, decreased LE strength and motor recruitment, gait deficits/deviations (most notably difficulty with L>R toe clearance during gait), and impaired postural stability in standing. Patient will benefit from continued skilled therapeutic intervention to address the above deficits as needed for improved function and QoL.      PT Short Term Goals - 01/15/21 1309       PT SHORT TERM GOAL #1   Title Patient will perform  stand-pivot transfer from chair to bed with supervision level of assist and ModI setup of power chair as needed for transferring at home    Baseline 11/26/20: Max assist for setup and mod assist to perform sit to stand, unsteady pivot prior to performing stand to sit.    12/23/20: Patient is able to setup power chair for transfer ModI. Mod to Max assist for sit to stand. CGA level of assist for standing pivot and stand to sit on low mat.   01/15/21: Setup of power chair without assist; modified independent pivot and stand-to-sit; ModA for initial set to stand prior to pivot.    Time 4    Period Weeks    Status Partially Met    Target Date 02/12/21      PT SHORT TERM GOAL #2   Title Patient will perform standing toe tap to 3-inch surface (Airex pad) with  bilateral upper extremity support indicative of improved ability to clear feet from floor during stepping and improved weight shift to each LE with AD use    Baseline 11/26/20: Limited toe clearance during bilateral LE swing phase with front-wheeled walker.   12/23/20: performed at previous follow-up with bilateral UE support.    Time 3    Period Weeks    Status Achieved    Target Date 12/17/20               PT Long Term Goals - 01/15/21 1258       PT LONG TERM GOAL #1   Title Patient will demonstrate improved function as evidenced by a score of 35 on FOTO measure for full participation in activities at home and in the community.    Baseline 11/26/20: FOTO 12.   12/23/20: FOTO 24.    01/15/21: FOTO 23    Time 16    Period Weeks    Status Partially Met    Target Date 03/18/21      PT LONG TERM GOAL #2   Title Patient will ambulate for 150 feet without LOB, with sufficient toe clearance to prevent loss of gait stability, and c proper AD placement with least-restrictive assistive device with supervision level of assist    Baseline 11/26/20: Gait x 40 feet with CGA.   12/23/20: CGA with dec toe clearance L>RLE.   01/15/21: Ambulated 160 feet  without LOB, proper AD placement; good toe clearance with first 80 feet, but decrased L>R toe clearanc with fatigue.    Time 12    Period Weeks    Status Partially Met    Target Date 02/19/21      PT LONG TERM GOAL #3   Title Patient will perform independent re-positioning in chair with use of closed-chain triceps on armrests as needed for independent wheelchair mobility with no verbal cueing or tactile cueing required    Baseline 11/26/20: Dependent re-positioning in wheelchair.   12/23/20: Performed with feet on fold-out platform of her power wheelchair IND.    Time 8    Period Weeks    Status Achieved    Target Date 12/23/20      PT LONG TERM GOAL #4   Title Patient will perform simulated step-over task for clearing doorway threshold with use of front-wheeled walker as needed for entering/exiting home with supervision level of assistance and no LOB    Baseline 11/26/20: Pt currently uses power wheelchair to negotiate threshold into home.   12/23/20: Deferred.  01/15/21: Deferred    Time 16    Period Weeks    Status Deferred    Target Date 03/18/21                   Plan - 01/23/21 1233     Clinical Impression Statement Patient is significantly challenged with attempted march with no UE support and unilateral UE support. She exhibits sufficient combined hip/knee flexion and ankle dorsiflexion to perform toe tap on Airex. She is improving in prehensile skills with patient able to grab various shapes/sizes with left upper limb. She is still significantly limited in horizontal adduction and shoulder elevation AROM in L>RUE. She demonstrates ongoing improvement in toe clearance during gait - toe drag is mainly apparent following significant fatigue (in L > R lower limb). Patient has remaining deficits in increased tone of wrist/digit flexors (L>RUE), decreased L>R shoulder AROM, impaired trunk control, decreased LE strength and motor recruitment, gait deficits/deviations (most notably  difficulty  with L>R toe clearance during gait), and impaired postural stability in standing. Patient will benefit from continued skilled therapeutic intervention to address the above deficits as needed for improved function and QoL.    Personal Factors and Comorbidities Age;Comorbidity 3+;Time since onset of injury/illness/exacerbation    Comorbidities GERD, pre-diabetes, obesity, gout, s/p C3-7 ACDF    Examination-Activity Limitations Bathing;Continence;Reach Overhead;Stairs;Bed Mobility;Dressing;Self Feeding;Stand;Hygiene/Grooming;Toileting;Transfers;Locomotion Level    Examination-Participation Restrictions Community Activity;Yard Work;Laundry;Cleaning;Shop    Stability/Clinical Decision Making Evolving/Moderate complexity    Rehab Potential Fair    PT Frequency Other (comment)   2-3x/week   PT Duration Other (comment)   16 weeks   PT Treatment/Interventions Electrical Stimulation;Gait training;Stair training;Functional mobility training;Therapeutic activities;Therapeutic exercise;Balance training;Neuromuscular re-education;Wheelchair mobility training;Patient/family education    PT Next Visit Plan Gait re-training, task practice for transferring, LE strengthening, UE AROM and strengthening to assist with functional mobility and transfers    PT Home Exercise Plan Pt continuing with home health exercises. Pt continuing with task practice for rolling, scooting, bridging in bed; walking with CGA with FWW; reaching and grasping with LUE.    Consulted and Agree with Plan of Care Patient;Family member/caregiver    Family Member Consulted Husband             Patient will benefit from skilled therapeutic intervention in order to improve the following deficits and impairments:  Abnormal gait, Decreased balance, Decreased mobility, Impaired sensation, Decreased strength  Visit Diagnosis: Difficulty in walking, not elsewhere classified  Muscle weakness (generalized)  Central cord syndrome at C3  level of cervical spinal cord, initial encounter [facility])     Problem List Patient Active Problem List   Diagnosis Date Noted   Suspected deep tissue injury 10/25/2020   Wheelchair dependence 11/27/2019   Spasticity 11/27/2019   Hyperkalemia    Steroid-induced hyperglycemia    Prediabetes    Neurogenic bowel    Neurogenic bladder    Neuropathic pain    Quadriplegia ([place]) 08/31/2019   S/P cervical spinal fusion 08/28/2019   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 [place], [place], [address] Phone: [phone]   Fax:  [phone]  Name: Meredith Roach MRN: [id number] Date of Birth: 1940/08/15

## 2021-01-23 ENCOUNTER — Encounter: Payer: Self-pay | Admitting: Physical Therapy

## 2021-01-23 ENCOUNTER — Ambulatory Visit: Payer: Medicare PPO | Admitting: Physical Therapy

## 2021-01-23 DIAGNOSIS — S14123A 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, initial encounter: Secondary | ICD-10-CM

## 2021-01-23 DIAGNOSIS — R262 Difficulty in walking, not elsewhere classified: Secondary | ICD-10-CM | POA: Diagnosis not present

## 2021-01-23 DIAGNOSIS — M6281 Muscle weakness (generalized): Secondary | ICD-10-CM

## 2021-01-23 NOTE — Therapy (Signed)
[facility] [address] [place], [place], [address] Phone: [phone]   Fax:  [phone]  Physical Therapy Treatment  Patient Details  Name: Meredith Roach MRN: [id number] Date of Birth: 18-May-1940 Referring Provider (PT): [name], MD   Encounter Date: 01/23/2021   PT End of Session - 01/23/21 1408     Visit Number 24    Number of Visits 81    Date for PT Re-Evaluation 03/18/21    Authorization Time Period Cert 09/26/05-37/10/62, last progress note 01/15/21    Progress Note Due on Visit 30    PT Start Time 1300    PT Stop Time 1350    PT Time Calculation (min) 50 min    Equipment Utilized During Treatment Gait belt   Pt uses power wheelchair, clinic's front-wheeled walker utilized for gait   Activity Tolerance Patient tolerated treatment well    Behavior During Therapy WFL for tasks assessed/performed             Past Medical History:  Diagnosis Date   Arthritis    GERD (gastroesophageal reflux disease)    Gout    Neuromuscular disorder ([place])    Pre-diabetes     Past Surgical History:  Procedure Laterality Date   ANTERIOR CERVICAL DECOMPRESSION/DISCECTOMY FUSION 4 LEVELS N/A 08/28/2019   Procedure: ANTERIOR CERVICAL DECOMPRESSION/DISCECTOMY FUSION CERVICALTHREE-FOUR CERVICAL,FOUR-FIVE,CERVICAL FIVE-SIX,CERVICAL SIX-SEVEN.;  Surgeon: [name], MD;  Location: [place];  Service: Neurosurgery;  Laterality: N/A;  ANTERIOR   BACK SURGERY     CATARACT EXTRACTION W/PHACO Right 03/31/2018   Procedure: CATARACT EXTRACTION PHACO AND INTRAOCULAR LENS PLACEMENT ([place]);  Surgeon: [name], MD;  Location: [facility];  Service: Ophthalmology;  Laterality: Right;  [place] 00:39.7 CDE 4.35 Fluid Pack Lot # [id number]   CHOLECYSTECTOMY  1995   COLONOSCOPY WITH PROPOFOL N/A 06/07/2017   Procedure: COLONOSCOPY WITH PROPOFOL;  Surgeon: [name], MD;  Location: [facility] ENDOSCOPY;  Service: Endoscopy;  Laterality: N/A;    ESOPHAGOGASTRODUODENOSCOPY (EGD) WITH PROPOFOL N/A 06/07/2017   Procedure: ESOPHAGOGASTRODUODENOSCOPY (EGD) WITH PROPOFOL;  Surgeon: [name], MD;  Location: [facility] ENDOSCOPY;  Service: Endoscopy;  Laterality: N/A;   IR CATHETER TUBE CHANGE  05/10/2020   REDUCTION MAMMAPLASTY Bilateral 1994    There were no vitals filed for this visit.   Subjective Assessment - 01/23/21 1405     Subjective Patient reports good tolerance of yesterday's session. She reports tightness in left lower limb. She states her main issue today is stiffness in her L hand/digits and difficulty with extending fingers. She is 9 days s/p Botox injection. She is continuing use of upper extremity exercises given by OT and use of board for low-load long duration stretch into extension for her digits. She states she is able to walk on her own at home with her husband standing behind her.    Patient is accompained by: Family member   husband   Pertinent History Patient is a 80 year old female s/p incomplete SCI. She had incomplete SCI following fall - C4 [place] D (DOI: 08/26/19). Primary activity limitations c difficulty walking, transferring, and completing independent functional mobility following incomplete SCI. She has undergone home health therapy for about one year. She and her husband reports she was able to transfer and ambulate with walker in home with PT walking beside her. Pt has practiced with front-wheel walker at home. 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 Patient reports using steady transfer aid for performing transfers chair to  bed and W/C to standard chair. Her husband states that she is able to stand with steady device and can stand with her legs against the bed. Pt has ramp to get in her home for her power wheelchair. Patient lives in one level home. Pt uses tub bench that she can utilize to slide into shower; her daughter/husband assists with bathing. Pt has Lucianne Lei that can accommodate her power chair at  this time. Pt has concrete driveway to get up to her ramp. Pt has small threshold to get into her home - husband utilizes cardboard to get over threshold.    Limitations Walking;Standing;House hold activities;Other (comment)   transferring, bed mobility, bladder management   Patient Stated Goals to be able to walk more independently              TREATMENT       Therapeutic Activities - patient education, repetitive task practice for improved performance of daily functional activities e.g. transferring   Sit to stand and gait to edge of table with modified-independent stand to sit prior to initiation of sitting/lying exercises; ambulation x 6 feet with 90-degree right turn prior to sitting edge of low mat  Sit to stand from raised table, 20" height; 2x10   Gait with FWW in clinic; 1 x 120 feet, around gym; CGA/supervision throughout gait    In // bars: Standing march with UE support on // bars; 2x12 alternating     Gait from // bars to power chair across clinic, including retrostep x 4 feet and standing pivot to exit // bars, ambulation x 30 feet to return to chair        *not today* Sit<>supine x 2 (via sidelying R, rolling onto back), pt requires mod A for trunk during sidelying to sit Rolling: 5 rolls in bilateral directions with VC on sequencing. Difficulty initiating roll to R with limited LUE cross-body adduction AROM; ModA for roll to L In // bars: Staggered stance weight shift; x20 bilat LE Standing heel raise/toe raise; x20 alternating Stand pivot transfer to/from wheelchair with front-wheeled walker with ModA for completion of sit to stand, supervision for pivot, and supervision for stand to sit. IND setup of power wheelchair prior to performance of transfer; x2 Bed mobility task practice: performed bilateral rolling with ModA for upper and lower limbs as needed to promote independent bed mobility. MaxA for L arm cross-body adduction to facilitate R rolling. MinA for L  rolling.             Neuromuscular Re-education - for increased motor recruitment of major muscle groups with motor impairment, nervous system priming and exercises to promote trunk stability      Seated march on Dynadisc with alternating UE/LE; x20 alternating   Sitting on purple disc, catching balloon and balloon tapping back to therapist - to facilitate active cross body adduction and trunk rotation as needed for rolling and reaching/grasping tasks (including low and high punches reaching today); x 2 minute catching, x 2 minutes balloon tapping       In // bars: Unilateral upper extremity reaching and grasping in standing position with minimal UE support (touching on rail prn if losing balance); fine motor grasping and trunk rotation to place items into container on opposite side of body (small clothes pins, small cylinder and cubes of various sizes, spheres - from "HAND PT" packet); x 5 min  -Added 3-inch lift to box to increase demand on reaching and shoulder elevation for placing items    *  next visit* Seated toe tapping onto Airex pad to promote active dorsiflexion during LE swing; x20 bilateral LE         *not today* Sitting on Dynadisc, cross-body "boxing" with sliders in therapist's hands as target ("cross" punch) to facilitate active cross body adduction and trunk rotation as needed for rolling and reaching/grasping tasks (including low and high punches today); x 25 alternating Lower limb combined hip/knee flexion and dorsiflexion with pawing with VMS for improved dorsiflexor activation. 200 pps phase duration, ramp 1 sec, cycle time 5/5, 32 mA intensity; x 8 minutes each lower extremity Bridging; x25, partial range  Seated forward/backward trunk lean on Dynadisc; x25 Lower limb combined hip/knee flexion and dorsiflexion in unsupported sitting position, tactile cueing for upright posture and to facilitate active dorsiflexion with hip/knee flexion; x25 alternating  Seated  pelvic tilt on Dynadisc; alternating hip hike; x25 alternating L/R Grasping L UE with fine motor pulp to pulp grasp, grabbing bolts and placing into adjacent container requiring trunk lean to L (in sitting on dynadisc)x 4 min Lower limb combined hip/knee flexion and dorsiflexion in unsupported sitting position, tactile cueing for upright posture and to facilitate active dorsiflexion with hip/knee flexion; x25 alternating  -Standing forward kick with active dorsiflexion with verbal cueing prior to movement for "taking foot off of gas pedal"; x20 alternating Clamshells in side lying, no resistance, BLE; x20  -Standing bilateral weight shift in static standing with no UE support; x25 alternating Standing march, with alternating unilateral upper limb support; x20 Pedal feet, with unilateral upper limb support; x10 with each arm holding single parallel bar  Standing with FWW with mat table behind patient -Standing hip flexion with visual and tactile cueing for increased range to improve foot clearance during gait; x10 each LE             ASSESSMENT Patient exhibits improving toe clearance during gait with toe drag more apparent following fatigue. She has shoulder forward elevation AROM deficits, but she is able to reach and grasp for items at edge of BOS with intermittent UE support on single parallel bar. She demonstrates good active dorsiflexion, moderate loss of active hip flexion likely associated with truncal weakness. Pt is able to progress demand of trunk stabilization work today with more-challenging sitting surface. Patient has remaining deficits in increased tone of wrist/digit flexors (L>RUE), decreased L>R shoulder AROM, impaired trunk control, decreased LE strength and motor recruitment, gait deficits/deviations (most notably difficulty with L>R toe clearance during gait), and impaired postural stability in standing. Patient will benefit from continued skilled therapeutic intervention to  address the above deficits as needed for improved function and QoL.         PT Education - 01/23/21 1411     Education Details Discussed utilizing digit AROM immediately following long-duration stretch on board to "activate" into new range for her hand/digits. Reviewed existing program established with previous therapy for upper limb A/AAROM    Person(s) Educated Patient    Methods Explanation    Comprehension Verbalized understanding;Returned demonstration              PT Short Term Goals - 01/15/21 1309       PT SHORT TERM GOAL #1   Title Patient will perform stand-pivot transfer from chair to bed with supervision level of assist and ModI setup of power chair as needed for transferring at home    Baseline 11/26/20: Max assist for setup and mod assist to perform sit to stand, unsteady pivot prior to performing  stand to sit.    12/23/20: Patient is able to setup power chair for transfer ModI. Mod to Max assist for sit to stand. CGA level of assist for standing pivot and stand to sit on low mat.   01/15/21: Setup of power chair without assist; modified independent pivot and stand-to-sit; ModA for initial set to stand prior to pivot.    Time 4    Period Weeks    Status Partially Met    Target Date 02/12/21      PT SHORT TERM GOAL #2   Title Patient will perform standing toe tap to 3-inch surface (Airex pad) with bilateral upper extremity support indicative of improved ability to clear feet from floor during stepping and improved weight shift to each LE with AD use    Baseline 11/26/20: Limited toe clearance during bilateral LE swing phase with front-wheeled walker.   12/23/20: performed at previous follow-up with bilateral UE support.    Time 3    Period Weeks    Status Achieved    Target Date 12/17/20               PT Long Term Goals - 01/15/21 1258       PT LONG TERM GOAL #1   Title Patient will demonstrate improved function as evidenced by a score of 35 on FOTO measure for  full participation in activities at home and in the community.    Baseline 11/26/20: FOTO 12.   12/23/20: FOTO 24.    01/15/21: FOTO 23    Time 16    Period Weeks    Status Partially Met    Target Date 03/18/21      PT LONG TERM GOAL #2   Title Patient will ambulate for 150 feet without LOB, with sufficient toe clearance to prevent loss of gait stability, and c proper AD placement with least-restrictive assistive device with supervision level of assist    Baseline 11/26/20: Gait x 40 feet with CGA.   12/23/20: CGA with dec toe clearance L>RLE.   01/15/21: Ambulated 160 feet without LOB, proper AD placement; good toe clearance with first 80 feet, but decrased L>R toe clearanc with fatigue.    Time 12    Period Weeks    Status Partially Met    Target Date 02/19/21      PT LONG TERM GOAL #3   Title Patient will perform independent re-positioning in chair with use of closed-chain triceps on armrests as needed for independent wheelchair mobility with no verbal cueing or tactile cueing required    Baseline 11/26/20: Dependent re-positioning in wheelchair.   12/23/20: Performed with feet on fold-out platform of her power wheelchair IND.    Time 8    Period Weeks    Status Achieved    Target Date 12/23/20      PT LONG TERM GOAL #4   Title Patient will perform simulated step-over task for clearing doorway threshold with use of front-wheeled walker as needed for entering/exiting home with supervision level of assistance and no LOB    Baseline 11/26/20: Pt currently uses power wheelchair to negotiate threshold into home.   12/23/20: Deferred.  01/15/21: Deferred    Time 16    Period Weeks    Status Deferred    Target Date 03/18/21                   Plan - 01/23/21 1415     Clinical Impression Statement Patient exhibits improving toe clearance during  gait with toe drag more apparent following fatigue. She has shoulder forward elevation AROM deficits, but she is able to reach and grasp for items  at edge of BOS with intermittent UE support on single parallel bar. She demonstrates good active dorsiflexion, moderate loss of active hip flexion likely associated with truncal weakness. Pt is able to progress demand of trunk stabilization work today with more-challenging sitting surface. Patient has remaining deficits in increased tone of wrist/digit flexors (L>RUE), decreased L>R shoulder AROM, impaired trunk control, decreased LE strength and motor recruitment, gait deficits/deviations (most notably difficulty with L>R toe clearance during gait), and impaired postural stability in standing. Patient will benefit from continued skilled therapeutic intervention to address the above deficits as needed for improved function and QoL.    Personal Factors and Comorbidities Age;Comorbidity 3+;Time since onset of injury/illness/exacerbation    Comorbidities GERD, pre-diabetes, obesity, gout, s/p C3-7 ACDF    Examination-Activity Limitations Bathing;Continence;Reach Overhead;Stairs;Bed Mobility;Dressing;Self Feeding;Stand;Hygiene/Grooming;Toileting;Transfers;Locomotion Level    Examination-Participation Restrictions Community Activity;Yard Work;Laundry;Cleaning;Shop    Stability/Clinical Decision Making Evolving/Moderate complexity    Rehab Potential Fair    PT Frequency Other (comment)   2-3x/week   PT Duration Other (comment)   16 weeks   PT Treatment/Interventions Electrical Stimulation;Gait training;Stair training;Functional mobility training;Therapeutic activities;Therapeutic exercise;Balance training;Neuromuscular re-education;Wheelchair mobility training;Patient/family education    PT Next Visit Plan Gait re-training, task practice for transferring, LE strengthening, UE AROM and strengthening to assist with functional mobility and transfers    PT Home Exercise Plan Pt continuing with home health exercises. Pt continuing with task practice for rolling, scooting, bridging in bed; walking with CGA with FWW;  reaching and grasping with LUE.    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, initial encounter [facility])     Problem List Patient Active Problem List   Diagnosis Date Noted   Suspected deep tissue injury 10/25/2020   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 [place], [place], [address] Phone: [phone]   Fax:  [phone]  Name: MARIGENE ERLER MRN: [id number] Date of Birth: [birth year]

## 2021-01-24 ENCOUNTER — Other Ambulatory Visit: Payer: Self-pay

## 2021-01-24 ENCOUNTER — Encounter: Payer: Medicare PPO | Admitting: Physical Medicine and Rehabilitation

## 2021-01-24 ENCOUNTER — Encounter: Payer: Self-pay | Admitting: Physical Medicine and Rehabilitation

## 2021-01-24 VITALS — BP 114/73 | HR 74 | Temp 98.2°F

## 2021-01-24 DIAGNOSIS — G8250 Quadriplegia, unspecified: Secondary | ICD-10-CM | POA: Diagnosis not present

## 2021-01-24 DIAGNOSIS — R252 Cramp and spasm: Secondary | ICD-10-CM | POA: Diagnosis not present

## 2021-01-24 DIAGNOSIS — Z993 Dependence on wheelchair: Secondary | ICD-10-CM | POA: Diagnosis not present

## 2021-01-24 DIAGNOSIS — N319 Neuromuscular dysfunction of bladder, unspecified: Secondary | ICD-10-CM

## 2021-01-24 DIAGNOSIS — F411 Generalized anxiety disorder: Secondary | ICD-10-CM

## 2021-01-24 MED ORDER — BUSPIRONE HCL 5 MG PO TABS: 5.0000 mg | ORAL_TABLET | Freq: Three times a day (TID) | ORAL | 5 refills | Status: DC | PRN

## 2021-01-24 MED ORDER — PAROXETINE HCL 10 MG PO TABS: 10.0000 mg | ORAL_TABLET | Freq: Every day | ORAL | 5 refills | Status: DC

## 2021-01-24 NOTE — Progress Notes (Signed)
Subjective:    Patient ID: Meredith Roach, female    DOB: 03/24/1941, 80 y.o.   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 And worsening spasticity.   Injury was 08/23/19!  Here for f/u on quadriplegia.    Got Botox 01/14/21 by Dr Letta Pate.  Brachioradialis 25 FCR0 FCU0 FDS50 FDP50 FPL0 Lumbricals 25 x 3=75 Total 200 units total-   Doing fairly good-  Therapy going well- 3x/week- PT only- OT has finished.    Doesn't like [facility]- they quit taking care of her "for no reason"  Difficult to care for her at home due to her SCI/quadriplegi and physical needs.  Still cannot get OOB;  cannot bathe or dress self; unable to do toileting on her own; cannot even wipe; is able ot feed self somewhat- mainly finger foods. .   They help her to bathroom with Steady- goes on toilet- no accidents.    Botox has kicked in - working better since Botox-.  Her L hand.    Having a lot of anxiety- brings on a lot of tough days-  Both daughters have also seen it.  If tells family to do something- they "don't like it".  Problem doing things exactly like pt wants-  Hasn't had Panic attacks- really stresses her out- and then passes on to rest of family- making everyone stressed.  Ex: Certain way HAS to put clothes on rack to dry. Before injury didn't have this problem.  Is a worrying type.    Also has a problems sleeping well at night- all night.  Sleeping OK, just not great.    Has foley- still- just changed.  Kinks in the leg crease- and can back up- connecting to thigh- sometimes occurs.   Still on Gabapentin , Baclofen and Duoxetine Spasms and nerve pain doing much better- stable.  Spasms don't last as long and aren't as severe as well.    Social Hx: Living with daughter- doors narrow.   Pain Inventory Average  Pain 4 Pain Right Now 4 My pain is burning  LOCATION OF PAIN  hand, fingers, leg, ankle  BOWEL Number of stools per week: 5-10 Oral laxative use No  Type of laxative na Enema or suppository use No  History of colostomy No  Incontinent No   BLADDER Foley In and out cath, frequency . Able to self cath Yes  Bladder incontinence No  Frequent urination No  Leakage with coughing No  Difficulty starting stream No  Incomplete bladder emptying No    Mobility walk with assistance use a walker how many minutes can you walk? 5-10 use a wheelchair needs help with transfers  Function disabled: date disabled .  Neuro/Psych No problems in this area  Prior Studies Any changes since last visit?  no  Physicians involved in your care Any changes since last visit?  no   Family History  Problem Relation Age of Onset   Breast cancer Maternal Aunt    Breast cancer Paternal Aunt    CAD Father    Heart attack Father    Social History   Socioeconomic History   Marital status: Married    Spouse name: [name]   Number of children: 2   Years of education: Not on file   Highest education level: Not on file  Occupational History   Not on file  Tobacco Use   Smoking status: Never   Smokeless tobacco: Never  Vaping Use   Vaping Use: Never used  Substance and Sexual Activity   Alcohol use: No   Drug use: No   Sexual activity: Not Currently  Other Topics Concern   Not on file  Social History Narrative   Lives with daughter   Social Determinants of Health   Financial Resource Strain: Not on file  Food Insecurity: Not on file  Transportation Needs: Not on file  Physical Activity: Not on file  Stress: Not on file  Social Connections: Not on file   Past Surgical History:  Procedure Laterality Date   ANTERIOR CERVICAL DECOMPRESSION/DISCECTOMY FUSION 4 LEVELS N/A 08/28/2019   Procedure: ANTERIOR CERVICAL DECOMPRESSION/DISCECTOMY FUSION CERVICALTHREE-FOUR  CERVICAL,FOUR-FIVE,CERVICAL FIVE-SIX,CERVICAL SIX-SEVEN.;  Surgeon: [name], MD;  Location: [facility];  Service: Neurosurgery;  Laterality: N/A;  ANTERIOR   BACK SURGERY     CATARACT EXTRACTION W/PHACO Right 03/31/2018   Procedure: CATARACT EXTRACTION PHACO AND INTRAOCULAR LENS PLACEMENT ([place]);  Surgeon: [name], MD;  Location: [facility];  Service: Ophthalmology;  Laterality: Right;  [place] 00:39.7 CDE 4.35 Fluid Pack Lot # [id number]   CHOLECYSTECTOMY  1995   COLONOSCOPY WITH PROPOFOL N/A 06/07/2017   Procedure: COLONOSCOPY WITH PROPOFOL;  Surgeon: [name], MD;  Location: [facility] END[facility]COPY;  Service: Endoscopy;  Laterality: N/A;   ESOPHAGOGASTRODUODENOSCOPY (EGD) WITH PROPOFOL N/A 06/07/2017   Procedure: ESOPHAGOGASTRODUODENOSCOPY (EGD) WITH PROPOFOL;  Surgeon: [name], MD;  Location: [facility] ENDOSCOPY;  Service: Endoscopy;  Laterality: N/A;   IR CATHETER TUBE CHANGE  05/10/2020   REDUCTION MAMMAPLASTY Bilateral 1994   Past Medical History:  Diagnosis Date   Arthritis    GERD (gastroesophageal reflux disease)    Gout    Neuromuscular disorder (HCC)    Pre-diabetes    BP 114/73   Pulse 74   Temp 98.2 F (36.8 C) (Oral)   SpO2 92%   Opioid Risk Score:   Fall Risk Score:  `1  Depression screen PHQ 2/9  Depression screen [facility] 2/9 10/25/2020 10/04/2020 05/24/2020 01/15/2020 11/27/2019 10/11/2019  Decreased Interest 0 0 0 0 0 0  Down, Depressed, Hopeless 0 0 0 0 0 0  PHQ - 2 Score 0 0 0 0 0 0      Review of Systems  Musculoskeletal:        Hand, fingers, leg, ankle  All other systems reviewed and are negative.     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 And worsening spasticity.   Injury was 08/23/19!  Here for f/u on  quadriplegia.  Suggest applying for CAP program- usually for Medicaid, but sometimes I think I've had patients get it in past- need to look online and then PUT ME DOWN as the doctor to fill out paperwork!  2.  Will try Paxil- 10 mg nightly for anxiety- for PREVENTION- sent in #30 with 5 refills- max dose 20 mg since on Duloxetine as well.  Can continue the Duloxetine at this dose.    3.  Buspar- Buspirone- 5 mg 3x/day as needed- when has anxiety. Not magic, but can help.    4. Try to make the focus of the tasks as that's it's being done- not focus on HOW task being done.    5. Con't Baclofen, Duloxetine and Gabapentin- has 3 months of meds left.    6. F/U in 3 months- double appointment SCI  7. Look into Medicaid- to see if would make easier to get CAP?  I spent a total of 32 minutes on visit- as detailed above- focusing on CAP and anxiety and spasticity- compared strength exam- getting better

## 2021-01-24 NOTE — Patient Instructions (Signed)
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 And worsening spasticity.   Injury was 08/23/19!  Here for f/u on quadriplegia.  Suggest applying for CAP program- usually for Medicaid, but sometimes I think I've had patients get it in past- need to look online and then PUT ME DOWN as the doctor to fill out paperwork!  2.  Will try Paxil- 10 mg nightly for anxiety- for PREVENTION- sent in #30 with 5 refills- max dose 20 mg since on Duloxetine as well.  Can continue the Duloxetine at this dose.    3.  Buspar- Buspirone- 5 mg 3x/day as needed- when has anxiety. Not magic, but can help.    4. Try to make the focus of the tasks as that's it's being done- not focus on HOW task being done.    5. Con't Baclofen, Duloxetine and Gabapentin- has 3 months of meds left.    6. F/U in 3 months- double appointment SCI  7. Look into Medicaid- to see if would make easier to get CAP?

## 2021-01-27 ENCOUNTER — Encounter: Payer: Self-pay | Admitting: Physical Therapy

## 2021-01-27 ENCOUNTER — Ambulatory Visit: Payer: Medicare PPO | Admitting: Physical Therapy

## 2021-01-27 ENCOUNTER — Other Ambulatory Visit: Payer: Self-pay

## 2021-01-27 DIAGNOSIS — R262 Difficulty in walking, not elsewhere classified: Secondary | ICD-10-CM | POA: Diagnosis not present

## 2021-01-27 DIAGNOSIS — M6281 Muscle weakness (generalized): Secondary | ICD-10-CM

## 2021-01-27 DIAGNOSIS — S14123A 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, initial encounter: Secondary | ICD-10-CM

## 2021-01-27 NOTE — Therapy (Signed)
[facility] [address] [place], [place], [address] Phone: [phone]   Fax:  [phone]  Physical Therapy Treatment  Patient Details  Name: Meredith Roach MRN: [id number] Date of Birth: June 19, 1940 Referring Provider (PT): [name], MD   Encounter Date: 01/27/2021   PT End of Session - 01/27/21 1138     Visit Number 25    Number of Visits 77    Date for PT Re-Evaluation 03/18/21    Authorization Time Period Cert 2/99/37-16/96/78, last progress note 01/15/21    Progress Note Due on Visit 30    PT Start Time 1145    PT Stop Time 1235    PT Time Calculation (min) 50 min    Equipment Utilized During Treatment Gait belt   Pt uses power wheelchair, clinic's front-wheeled walker utilized for gait   Activity Tolerance Patient tolerated treatment well    Behavior During Therapy WFL for tasks assessed/performed             Past Medical History:  Diagnosis Date   Arthritis    GERD (gastroesophageal reflux disease)    Gout    Neuromuscular disorder ([place])    Pre-diabetes     Past Surgical History:  Procedure Laterality Date   ANTERIOR CERVICAL DECOMPRESSION/DISCECTOMY FUSION 4 LEVELS N/A 08/28/2019   Procedure: ANTERIOR CERVICAL DECOMPRESSION/DISCECTOMY FUSION CERVICALTHREE-FOUR CERVICAL,FOUR-FIVE,CERVICAL FIVE-SIX,CERVICAL SIX-SEVEN.;  Surgeon: [name], MD;  Location: [facility];  Service: Neurosurgery;  Laterality: N/A;  ANTERIOR   BACK SURGERY     CATARACT EXTRACTION W/PHACO Right 03/31/2018   Procedure: CATARACT EXTRACTION PHACO AND INTRAOCULAR LENS PLACEMENT ([place]);  Surgeon: [name], MD;  Location: [facility];  Service: Ophthalmology;  Laterality: Right;  [place] 00:39.7 CDE 4.35 Fluid Pack Lot # [id number]   CHOLECYSTECTOMY  1995   COLONOSCOPY WITH PROPOFOL N/A 06/07/2017   Procedure: COLONOSCOPY WITH PROPOFOL;  Surgeon: [name], MD;  Location: [facility]COPY;  Service: Endoscopy;  Laterality: N/A;    ESOPHAGOGASTRODUODENOSCOPY (EGD) WITH PROPOFOL N/A 06/07/2017   Procedure: ESOPHAGOGASTRODUODENOSCOPY (EGD) WITH PROPOFOL;  Surgeon: [name], MD;  Location: [facility] END[facility]COPY;  Service: Endoscopy;  Laterality: N/A;   IR CATHETER TUBE CHANGE  05/10/2020   REDUCTION MAMMAPLASTY Bilateral 1994    There were no vitals filed for this visit.   Subjective Assessment - 01/27/21 1208     Subjective Patient reports feeling generally well at arrival to PT. Patient reports continued work on L hand digit extension at home s/p Botox injection. Patient is compliant with home program. Pt reports positive feedback from her physician this past Friday regarding her current progress.    Patient is accompained by: Family member   husband   Pertinent History Patient is a 80 year old female s/p incomplete SCI. She had incomplete SCI following fall - C4 [place] D (DOI: 08/26/19). Primary activity limitations c difficulty walking, transferring, and completing independent functional mobility following incomplete SCI. She has undergone home health therapy for about one year. She and her husband reports she was able to transfer and ambulate with walker in home with PT walking beside her. Pt has practiced with front-wheel walker at home. 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 Patient reports using steady transfer aid for performing transfers chair to bed and W/C to standard chair. Her husband states that she is able to stand with steady device and can stand with her legs against the bed. Pt has ramp to get in her home for her power  wheelchair. Patient lives in one level home. Pt uses tub bench that she can utilize to slide into shower; her daughter/husband assists with bathing. Pt has Lucianne Lei that can accommodate her power chair at this time. Pt has concrete driveway to get up to her ramp. Pt has small threshold to get into her home - husband utilizes cardboard to get over threshold.    Limitations  Walking;Standing;House hold activities;Other (comment)   transferring, bed mobility, bladder management   Patient Stated Goals to be able to walk more independently               TREATMENT       Therapeutic Activities - patient education, repetitive task practice for improved performance of daily functional activities e.g. transferring   Sit to stand and gait to edge of table with modified-independent stand to sit prior to initiation of sitting/lying exercises; ambulation x 6 feet with 90-degree right turn prior to sitting edge of low mat   Sit to stand from raised table, 20" height 1x10, 22" height 1x10   Gait with FWW in clinic; 1 x 120 feet, around gym; CGA/supervision throughout gait     Gait from // bars to power chair across clinic, including retrostep x 4 feet and standing pivot to exit // bars, ambulation x 30 feet to return to chair         *not today* In // bars: Standing march with UE support on // bars; 2x12 alternating Sit<>supine x 2 (via sidelying R, rolling onto back), pt requires mod A for trunk during sidelying to sit Rolling: 5 rolls in bilateral directions with VC on sequencing. Difficulty initiating roll to R with limited LUE cross-body adduction AROM; ModA for roll to L In // bars: Staggered stance weight shift; x20 bilat LE Standing heel raise/toe raise; x20 alternating Stand pivot transfer to/from wheelchair with front-wheeled walker with ModA for completion of sit to stand, supervision for pivot, and supervision for stand to sit. IND setup of power wheelchair prior to performance of transfer; x2 Bed mobility task practice: performed bilateral rolling with ModA for upper and lower limbs as needed to promote independent bed mobility. MaxA for L arm cross-body adduction to facilitate R rolling. MinA for L rolling.             Neuromuscular Re-education - for increased motor recruitment of major muscle groups with motor impairment, nervous system  priming and exercises to promote trunk stability      Seated march on purple disc with alternating UE/LE; x25 alternating   Sitting on purple disc, cross-body "boxing" with sliders in therapist's hands as target ("cross" punch) to facilitate active cross body adduction and trunk rotation as needed for rolling and reaching/grasping tasks (including low and high punches today); x 25 alternating  Sitting on purple disc, catching and throwing with bilateral UE with pink soccer ball, multi-directional reaching and bilateral grasping; x 3 min       In // bars: Unilateral upper extremity reaching and grasping in standing position with minimal UE support (touching on rail prn if losing balance); fine motor grasping and trunk rotation to place items into container on opposite side of body (small clothes pins, small cylinder and cubes of various sizes, nuts, spheres - from "HAND PT" packet); x 3 min  -Added 3-inch lift to box to increase demand on reaching and shoulder elevation for placing items  Standing toe tapping onto Airex pad to promote active dorsiflexion during LE swing; x20 bilateral LE  Pedal feet with bilateral lower extremities; 2x12 alternating, no UE support   *Attempted standing march with R upper extremity support only on single parallel bar, pt able to lift RLE, but not LLE        *not today* Sitting on purple disc, catching balloon and balloon tapping back to therapist - to facilitate active cross body adduction and trunk rotation as needed for rolling and reaching/grasping tasks (including low and high punches reaching today); x 2 minute catching, x 2 minutes balloon tapping Lower limb combined hip/knee flexion and dorsiflexion with pawing with VMS for improved dorsiflexor activation. 200 pps phase duration, ramp 1 sec, cycle time 5/5, 32 mA intensity; x 8 minutes each lower extremity Bridging; x25, partial range  Seated forward/backward trunk lean on Dynadisc; x25 Lower limb  combined hip/knee flexion and dorsiflexion in unsupported sitting position, tactile cueing for upright posture and to facilitate active dorsiflexion with hip/knee flexion; x25 alternating  Seated pelvic tilt on Dynadisc; alternating hip hike; x25 alternating L/R Grasping L UE with fine motor pulp to pulp grasp, grabbing bolts and placing into adjacent container requiring trunk lean to L (in sitting on dynadisc)x 4 min Lower limb combined hip/knee flexion and dorsiflexion in unsupported sitting position, tactile cueing for upright posture and to facilitate active dorsiflexion with hip/knee flexion; x25 alternating              ASSESSMENT Patient is able to continue progression of trunk stabilization work with sitting surface requiring increased demand on trunk stability. She is able to continue progression of reaching and grasping and weight-shifting drills. Patient exhibits significant difficulty with pulp-to-pulp grasp to grab smaller items today. She is exhibiting improving toe clearance bilaterally with initiation of gait - toe drag is more apparent following fatigue. Patient has remaining deficits in increased tone of wrist/digit flexors (L>RUE), decreased L>R shoulder AROM, impaired trunk control, decreased LE strength and motor recruitment, gait deficits/deviations (most notably difficulty with L>R toe clearance during gait), and impaired postural stability in standing. Patient will benefit from continued skilled therapeutic intervention to address the above deficits as needed for improved function and QoL.        PT Short Term Goals - 01/15/21 1309       PT SHORT TERM GOAL #1   Title Patient will perform stand-pivot transfer from chair to bed with supervision level of assist and ModI setup of power chair as needed for transferring at home    Baseline 11/26/20: Max assist for setup and mod assist to perform sit to stand, unsteady pivot prior to performing stand to sit.    12/23/20: Patient  is able to setup power chair for transfer ModI. Mod to Max assist for sit to stand. CGA level of assist for standing pivot and stand to sit on low mat.   01/15/21: Setup of power chair without assist; modified independent pivot and stand-to-sit; ModA for initial set to stand prior to pivot.    Time 4    Period Weeks    Status Partially Met    Target Date 02/12/21      PT SHORT TERM GOAL #2   Title Patient will perform standing toe tap to 3-inch surface (Airex pad) with bilateral upper extremity support indicative of improved ability to clear feet from floor during stepping and improved weight shift to each LE with AD use    Baseline 11/26/20: Limited toe clearance during bilateral LE swing phase with front-wheeled walker.   12/23/20: performed at previous follow-up with  bilateral UE support.    Time 3    Period Weeks    Status Achieved    Target Date 12/17/20               PT Long Term Goals - 01/15/21 1258       PT LONG TERM GOAL #1   Title Patient will demonstrate improved function as evidenced by a score of 35 on FOTO measure for full participation in activities at home and in the community.    Baseline 11/26/20: FOTO 12.   12/23/20: FOTO 24.    01/15/21: FOTO 23    Time 16    Period Weeks    Status Partially Met    Target Date 03/18/21      PT LONG TERM GOAL #2   Title Patient will ambulate for 150 feet without LOB, with sufficient toe clearance to prevent loss of gait stability, and c proper AD placement with least-restrictive assistive device with supervision level of assist    Baseline 11/26/20: Gait x 40 feet with CGA.   12/23/20: CGA with dec toe clearance L>RLE.   01/15/21: Ambulated 160 feet without LOB, proper AD placement; good toe clearance with first 80 feet, but decrased L>R toe clearanc with fatigue.    Time 12    Period Weeks    Status Partially Met    Target Date 02/19/21      PT LONG TERM GOAL #3   Title Patient will perform independent re-positioning in chair with  use of closed-chain triceps on armrests as needed for independent wheelchair mobility with no verbal cueing or tactile cueing required    Baseline 11/26/20: Dependent re-positioning in wheelchair.   12/23/20: Performed with feet on fold-out platform of her power wheelchair IND.    Time 8    Period Weeks    Status Achieved    Target Date 12/23/20      PT LONG TERM GOAL #4   Title Patient will perform simulated step-over task for clearing doorway threshold with use of front-wheeled walker as needed for entering/exiting home with supervision level of assistance and no LOB    Baseline 11/26/20: Pt currently uses power wheelchair to negotiate threshold into home.   12/23/20: Deferred.  01/15/21: Deferred    Time 16    Period Weeks    Status Deferred    Target Date 03/18/21                   Plan - 01/27/21 1324     Clinical Impression Statement Patient is able to continue progression of trunk stabilization work with sitting surface requiring increased demand on trunk stability. She is able to continue progression of reaching and grasping and weight-shifting drills. Patient exhibits significant difficulty with pulp-to-pulp grasp to grab smaller items today. She is exhibiting improving toe clearance bilaterally with initiation of gait - toe drag is more apparent following fatigue. Patient has remaining deficits in increased tone of wrist/digit flexors (L>RUE), decreased L>R shoulder AROM, impaired trunk control, decreased LE strength and motor recruitment, gait deficits/deviations (most notably difficulty with L>R toe clearance during gait), and impaired postural stability in standing. Patient will benefit from continued skilled therapeutic intervention to address the above deficits as needed for improved function and QoL.    Personal Factors and Comorbidities Age;Comorbidity 3+;Time since onset of injury/illness/exacerbation    Comorbidities GERD, pre-diabetes, obesity, gout, s/p C3-7 ACDF     Examination-Activity Limitations Bathing;Continence;Reach Overhead;Stairs;Bed Mobility;Dressing;Self Feeding;Stand;Hygiene/Grooming;Toileting;Transfers;Locomotion Level    Examination-Participation Restrictions  Community Activity;Yard Work;Laundry;Cleaning;Shop    Stability/Clinical Decision Making Evolving/Moderate complexity    Rehab Potential Fair    PT Frequency Other (comment)   2-3x/week   PT Duration Other (comment)   16 weeks   PT Treatment/Interventions Electrical Stimulation;Gait training;Stair training;Functional mobility training;Therapeutic activities;Therapeutic exercise;Balance training;Neuromuscular re-education;Wheelchair mobility training;Patient/family education    PT Next Visit Plan Gait re-training, task practice for transferring, LE strengthening, UE AROM and strengthening to assist with functional mobility and transfers    PT Home Exercise Plan Pt continuing with home health exercises. Pt continuing with task practice for rolling, scooting, bridging in bed; walking with CGA with FWW; reaching and grasping with LUE. Weight-shifting and pre-gait activities with decreasing UE support.    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, initial encounter [facility])     Problem List Patient Active Problem List   Diagnosis Date Noted   Generalized anxiety disorder 01/24/2021   Suspected deep tissue injury 10/25/2020   Wheelchair dependence 11/27/2019   Spasticity 11/27/2019   Hyperkalemia    Steroid-induced hyperglycemia    Prediabetes     Neurogenic bowel    Neurogenic bladder    Neuropathic pain    Quadriplegia ([place]) 08/31/2019   S/P cervical spinal fusion 08/28/2019   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 [place], [place], [address] Phone: [phone]   Fax:  [phone]  Name: Meredith Roach MRN: [id number] Date of Birth: 06-05-1940

## 2021-01-29 ENCOUNTER — Other Ambulatory Visit: Payer: Self-pay

## 2021-01-29 ENCOUNTER — Ambulatory Visit: Payer: Medicare PPO | Admitting: Physical Therapy

## 2021-01-29 DIAGNOSIS — R262 Difficulty in walking, not elsewhere classified: Secondary | ICD-10-CM | POA: Diagnosis not present

## 2021-01-29 DIAGNOSIS — M6281 Muscle weakness (generalized): Secondary | ICD-10-CM

## 2021-01-29 DIAGNOSIS — S14123A 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, initial encounter: Secondary | ICD-10-CM

## 2021-01-29 NOTE — Therapy (Signed)
[facility] [address] [place], [place], [address] Phone: [phone]   Fax:  [phone]  Physical Therapy Treatment  Patient Details  Name: Meredith Roach MRN: [id number] Date of Birth: 1941/03/21 Referring Provider (PT): [name], MD   Encounter Date: 01/29/2021   PT End of Session - 01/30/21 1011     Visit Number 26    Number of Visits 84    Date for PT Re-Evaluation 03/18/21    Authorization Time Period Cert 1/32/44-04/14/70, last progress note 01/15/21    Progress Note Due on Visit 30    PT Start Time 1156    PT Stop Time 1240    PT Time Calculation (min) 44 min    Equipment Utilized During Treatment Gait belt   Pt uses power wheelchair, clinic's front-wheeled walker utilized for gait   Activity Tolerance Patient tolerated treatment well    Behavior During Therapy WFL for tasks assessed/performed             Past Medical History:  Diagnosis Date   Arthritis    GERD (gastroesophageal reflux disease)    Gout    Neuromuscular disorder ([place])    Pre-diabetes     Past Surgical History:  Procedure Laterality Date   ANTERIOR CERVICAL DECOMPRESSION/DISCECTOMY FUSION 4 LEVELS N/A 08/28/2019   Procedure: ANTERIOR CERVICAL DECOMPRESSION/DISCECTOMY FUSION CERVICALTHREE-FOUR CERVICAL,FOUR-FIVE,CERVICAL FIVE-SIX,CERVICAL SIX-SEVEN.;  Surgeon: [name], MD;  Location: [facility];  Service: Neurosurgery;  Laterality: N/A;  ANTERIOR   BACK SURGERY     CATARACT EXTRACTION W/PHACO Right 03/31/2018   Procedure: CATARACT EXTRACTION PHACO AND INTRAOCULAR LENS PLACEMENT ([place]);  Surgeon: [name], MD;  Location: [facility];  Service: Ophthalmology;  Laterality: Right;  [place] 00:39.7 CDE 4.35 Fluid Pack Lot # [id number]   CHOLECYSTECTOMY  1995   COLONOSCOPY WITH PROPOFOL N/A 06/07/2017   Procedure: COLONOSCOPY WITH PROPOFOL;  Surgeon: [name], MD;  Location: [facility] ENDOSCOPY;  Service: Endoscopy;  Laterality: N/A;    ESOPHAGOGASTRODUODENOSCOPY (EGD) WITH PROPOFOL N/A 06/07/2017   Procedure: ESOPHAGOGASTRODUODENOSCOPY (EGD) WITH PROPOFOL;  Surgeon: [name], MD;  Location: [facility] END[facility]COPY;  Service: Endoscopy;  Laterality: N/A;   IR CATHETER TUBE CHANGE  05/10/2020   REDUCTION MAMMAPLASTY Bilateral 1994    There were no vitals filed for this visit.   Subjective Assessment - 01/29/21 1200     Subjective Patient reports notable tightness in her L hand. She reports continued work on digit/hand AROM. Patient reports minimal complaints with LLE - "little tight."    Patient is accompained by: Family member   husband   Pertinent History Patient is a 80 year old female s/p incomplete SCI. She had incomplete SCI following fall - C4 [place] D (DOI: 08/26/19). Primary activity limitations c difficulty walking, transferring, and completing independent functional mobility following incomplete SCI. She has undergone home health therapy for about one year. She and her husband reports she was able to transfer and ambulate with walker in home with PT walking beside her. Pt has practiced with front-wheel walker at home. 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 Patient reports using steady transfer aid for performing transfers chair to bed and W/C to standard chair. Her husband states that she is able to stand with steady device and can stand with her legs against the bed. Pt has ramp to get in her home for her power wheelchair. Patient lives in one level home. Pt uses tub bench that she can utilize to slide into shower;  her daughter/husband assists with bathing. Pt has Lucianne Lei that can accommodate her power chair at this time. Pt has concrete driveway to get up to her ramp. Pt has small threshold to get into her home - husband utilizes cardboard to get over threshold.    Limitations Walking;Standing;House hold activities;Other (comment)   transferring, bed mobility, bladder management   Patient Stated Goals to be able to  walk more independently                TREATMENT       Therapeutic Activities - patient education, repetitive task practice for improved performance of daily functional activities e.g. transferring   Sit to stand and gait to edge of table with modified-independent stand to sit prior to initiation of sitting/lying exercises; ambulation x 6 feet with 90-degree right turn prior to sitting edge of low mat   Sit to stand from raised table, 20" height 1x5, 22" height 1x10 [Pushing posterior thighs on table for 20" height, same height as power chair]   Gait with FWW in clinic; 1 x 120 feet, around gym; CGA/supervision throughout gait     Gait from // bars to power chair across clinic, including retrostep x 4 feet and standing pivot to exit // bars, ambulation x 30 feet to return to chair          *not today* In // bars: Standing march with UE support on // bars; 2x12 alternating Staggered stance weight shift; x20 bilat LE Standing heel raise/toe raise; x20 alternating Stand pivot transfer to/from wheelchair with front-wheeled walker with ModA for completion of sit to stand, supervision for pivot, and supervision for stand to sit. IND setup of power wheelchair prior to performance of transfer; x2 Bed mobility task practice: performed bilateral rolling with ModA for upper and lower limbs as needed to promote independent bed mobility. MaxA for L arm cross-body adduction to facilitate R rolling. MinA for L rolling.             Neuromuscular Re-education - for increased motor recruitment of major muscle groups with motor impairment, nervous system priming and exercises to promote trunk stability      Seated march on purple disc with alternating UE/LE; x25 alternating  Sitting on purple disc, catching and throwing with bilateral UE with pink soccer ball, multi-directional reaching and bilateral grasping; x 3 min       In // bars: Unilateral upper extremity reaching and grasping  in standing position with minimal UE support (touching on rail prn if losing balance); fine motor grasping and trunk rotation to place items into container on opposite side of body (small clothes pins, small cylinder and cubes of various sizes, nuts, spheres - from "HAND PT" packet); x 4 min     Standing toe tapping onto Airex pad to promote active dorsiflexion during LE swing; x20 bilateral LE  Pedal feet with bilateral lower extremities; x25 alternating, no UE support  Standing march with R upper extremity support only on single parallel bar, 2x10 alternating [full clearance of RLE, unweighting of LLE without clearance from floor]         *not today* Sitting on purple disc, cross-body "boxing" with sliders in therapist's hands as target ("cross" punch) to facilitate active cross body adduction and trunk rotation as needed for rolling and reaching/grasping tasks (including low and high punches today); x 25 alternating Sitting on purple disc, catching balloon and balloon tapping back to therapist - to facilitate active cross body adduction and trunk  rotation as needed for rolling and reaching/grasping tasks (including low and high punches reaching today); x 2 minute catching, x 2 minutes balloon tapping Lower limb combined hip/knee flexion and dorsiflexion with pawing with VMS for improved dorsiflexor activation. 200 pps phase duration, ramp 1 sec, cycle time 5/5, 32 mA intensity; x 8 minutes each lower extremity Bridging; x25, partial range  Seated forward/backward trunk lean on Dynadisc; x25 Lower limb combined hip/knee flexion and dorsiflexion in unsupported sitting position, tactile cueing for upright posture and to facilitate active dorsiflexion with hip/knee flexion; x25 alternating  Seated pelvic tilt on Dynadisc; alternating hip hike; x25 alternating L/R Grasping L UE with fine motor pulp to pulp grasp, grabbing bolts and placing into adjacent container requiring trunk lean to L (in  sitting on dynadisc)x 4 min Lower limb combined hip/knee flexion and dorsiflexion in unsupported sitting position, tactile cueing for upright posture and to facilitate active dorsiflexion with hip/knee flexion; x25 alternating              ASSESSMENT Patient exhibits improving ability to bear full body-weight onto lower extremities with pt able to perform 1-2 second unipedal stance on left lower limb with unilateral RUE support on walker. She is unable to complete full unweighting of LLE to bear full weight onto RLE given notable L hip flexor weakness. She exhibits improving reach and grasp with improved ability to perform pulp-to-pulp grip with smaller items with varied textures - decreasing errors with reaching and grasping. She exhibits gradually improving toe clearance during gait - she is able to fully clear bilateral toes during swing phase on each LE with first lap around gym, but this is diminished with fatigue. Patient has remaining deficits in increased tone of wrist/digit flexors (L>RUE), decreased L>R shoulder AROM, impaired trunk control, decreased LE strength and motor recruitment, gait deficits/deviations (most notably difficulty with L>R toe clearance during gait), and impaired postural stability in standing. Patient will benefit from continued skilled therapeutic intervention to address the above deficits as needed for improved function and QoL.          PT Short Term Goals - 01/15/21 1309       PT SHORT TERM GOAL #1   Title Patient will perform stand-pivot transfer from chair to bed with supervision level of assist and ModI setup of power chair as needed for transferring at home    Baseline 11/26/20: Max assist for setup and mod assist to perform sit to stand, unsteady pivot prior to performing stand to sit.    12/23/20: Patient is able to setup power chair for transfer ModI. Mod to Max assist for sit to stand. CGA level of assist for standing pivot and stand to sit on low mat.    01/15/21: Setup of power chair without assist; modified independent pivot and stand-to-sit; ModA for initial set to stand prior to pivot.    Time 4    Period Weeks    Status Partially Met    Target Date 02/12/21      PT SHORT TERM GOAL #2   Title Patient will perform standing toe tap to 3-inch surface (Airex pad) with bilateral upper extremity support indicative of improved ability to clear feet from floor during stepping and improved weight shift to each LE with AD use    Baseline 11/26/20: Limited toe clearance during bilateral LE swing phase with front-wheeled walker.   12/23/20: performed at previous follow-up with bilateral UE support.    Time 3    Period Weeks  Status Achieved    Target Date 12/17/20               PT Long Term Goals - 01/15/21 1258       PT LONG TERM GOAL #1   Title Patient will demonstrate improved function as evidenced by a score of 35 on FOTO measure for full participation in activities at home and in the community.    Baseline 11/26/20: FOTO 12.   12/23/20: FOTO 24.    01/15/21: FOTO 23    Time 16    Period Weeks    Status Partially Met    Target Date 03/18/21      PT LONG TERM GOAL #2   Title Patient will ambulate for 150 feet without LOB, with sufficient toe clearance to prevent loss of gait stability, and c proper AD placement with least-restrictive assistive device with supervision level of assist    Baseline 11/26/20: Gait x 40 feet with CGA.   12/23/20: CGA with dec toe clearance L>RLE.   01/15/21: Ambulated 160 feet without LOB, proper AD placement; good toe clearance with first 80 feet, but decrased L>R toe clearanc with fatigue.    Time 12    Period Weeks    Status Partially Met    Target Date 02/19/21      PT LONG TERM GOAL #3   Title Patient will perform independent re-positioning in chair with use of closed-chain triceps on armrests as needed for independent wheelchair mobility with no verbal cueing or tactile cueing required    Baseline  11/26/20: Dependent re-positioning in wheelchair.   12/23/20: Performed with feet on fold-out platform of her power wheelchair IND.    Time 8    Period Weeks    Status Achieved    Target Date 12/23/20      PT LONG TERM GOAL #4   Title Patient will perform simulated step-over task for clearing doorway threshold with use of front-wheeled walker as needed for entering/exiting home with supervision level of assistance and no LOB    Baseline 11/26/20: Pt currently uses power wheelchair to negotiate threshold into home.   12/23/20: Deferred.  01/15/21: Deferred    Time 16    Period Weeks    Status Deferred    Target Date 03/18/21                   Plan - 01/30/21 1421     Clinical Impression Statement Patient exhibits improving ability to bear full body-weight onto lower extremities with pt able to perform 1-2 second unipedal stance on left lower limb with unilateral RUE support on walker. She is unable to complete full unweighting of LLE to bear full weight onto RLE given notable L hip flexor weakness. She exhibits improving reach and grasp with improved ability to perform pulp-to-pulp grip with smaller items with varied textures - decreasing errors with reaching and grasping. She exhibits gradually improving toe clearance during gait - she is able to fully clear bilateral toes during swing phase on each LE with first lap around gym, but this is diminished with fatigue. Patient has remaining deficits in increased tone of wrist/digit flexors (L>RUE), decreased L>R shoulder AROM, impaired trunk control, decreased LE strength and motor recruitment, gait deficits/deviations (most notably difficulty with L>R toe clearance during gait), and impaired postural stability in standing. Patient will benefit from continued skilled therapeutic intervention to address the above deficits as needed for improved function and QoL.    Personal Factors and Comorbidities Age;Comorbidity  3+;Time since onset of  injury/illness/exacerbation    Comorbidities GERD, pre-diabetes, obesity, gout, s/p C3-7 ACDF    Examination-Activity Limitations Bathing;Continence;Reach Overhead;Stairs;Bed Mobility;Dressing;Self Feeding;Stand;Hygiene/Grooming;Toileting;Transfers;Locomotion Level    Examination-Participation Restrictions Community Activity;Yard Work;Laundry;Cleaning;Shop    Stability/Clinical Decision Making Evolving/Moderate complexity    Rehab Potential Fair    PT Frequency Other (comment)   2-3x/week   PT Duration Other (comment)   16 weeks   PT Treatment/Interventions Electrical Stimulation;Gait training;Stair training;Functional mobility training;Therapeutic activities;Therapeutic exercise;Balance training;Neuromuscular re-education;Wheelchair mobility training;Patient/family education    PT Next Visit Plan Gait re-training, task practice for transferring, LE strengthening, UE AROM and strengthening to assist with functional mobility and transfers    PT Home Exercise Plan Pt continuing with home health exercises. Pt continuing with task practice for rolling, scooting, bridging in bed; walking with CGA with FWW; reaching and grasping with LUE. Weight-shifting and pre-gait activities with decreasing UE support.    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, initial encounter [facility])     Problem List Patient Active Problem List   Diagnosis Date Noted   Generalized anxiety disorder 01/24/2021   Suspected deep tissue injury 10/25/2020   Wheelchair dependence  11/27/2019   Spasticity 11/27/2019   Hyperkalemia    Steroid-induced hyperglycemia    Prediabetes    Neurogenic bowel    Neurogenic bladder    Neuropathic pain    Quadriplegia ([place]) 08/31/2019   S/P cervical spinal fusion 08/28/2019   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 [place], [place], [address] Phone: [phone]   Fax:  [phone]  Name: Meredith Roach MRN: [id number] Date of Birth: [birth year]

## 2021-01-30 ENCOUNTER — Ambulatory Visit: Payer: Medicare PPO | Admitting: Physical Therapy

## 2021-01-30 ENCOUNTER — Encounter: Payer: Self-pay | Admitting: Physical Therapy

## 2021-01-30 DIAGNOSIS — R262 Difficulty in walking, not elsewhere classified: Secondary | ICD-10-CM

## 2021-01-30 DIAGNOSIS — S14123A 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, initial encounter: Secondary | ICD-10-CM

## 2021-01-30 DIAGNOSIS — M6281 Muscle weakness (generalized): Secondary | ICD-10-CM

## 2021-02-01 ENCOUNTER — Encounter: Payer: Self-pay | Admitting: Physical Therapy

## 2021-02-01 NOTE — Therapy (Signed)
[facility] [address] [place], [place], [address] Phone: [phone]   Fax:  [phone]  Physical Therapy Treatment  Patient Details  Name: Meredith Roach MRN: [id number] Date of Birth: 1941-04-09 Referring Provider (PT): [name], MD   Encounter Date: 01/30/2021   PT End of Session - 02/01/21 1257     Visit Number 27    Number of Visits 39    Date for PT Re-Evaluation 03/18/21    Authorization Time Period Cert 2/35/36-14/43/15, last progress note 01/15/21    Progress Note Due on Visit 30    PT Start Time 1257    PT Stop Time 1345    PT Time Calculation (min) 48 min    Equipment Utilized During Treatment Gait belt   Pt uses power wheelchair, clinic's front-wheeled walker utilized for gait   Activity Tolerance Patient tolerated treatment well    Behavior During Therapy [facility] for tasks assessed/performed             Past Medical History:  Diagnosis Date   Arthritis    GERD (gastroesophageal reflux disease)    Gout    Neuromuscular disorder ([place])    Pre-diabetes     Past Surgical History:  Procedure Laterality Date   ANTERIOR CERVICAL DECOMPRESSION/DISCECTOMY FUSION 4 LEVELS N/A 08/28/2019   Procedure: ANTERIOR CERVICAL DECOMPRESSION/DISCECTOMY FUSION CERVICALTHREE-FOUR CERVICAL,FOUR-FIVE,CERVICAL FIVE-SIX,CERVICAL SIX-SEVEN.;  Surgeon: [name], MD;  Location: [place];  Service: Neurosurgery;  Laterality: N/A;  ANTERIOR   BACK SURGERY     CATARACT EXTRACTION W/PHACO Right 03/31/2018   Procedure: CATARACT EXTRACTION PHACO AND INTRAOCULAR LENS PLACEMENT ([place]);  Surgeon: [name], MD;  Location: [facility];  Service: Ophthalmology;  Laterality: Right;  [place] 00:39.7 CDE 4.35 Fluid Pack Lot # [id number]   CHOLECYSTECTOMY  1995   COLONOSCOPY WITH PROPOFOL N/A 06/07/2017   Procedure: COLONOSCOPY WITH PROPOFOL;  Surgeon: [name], MD;  Location: [facility] ENDOSCOPY;  Service: Endoscopy;  Laterality: N/A;    ESOPHAGOGASTRODUODENOSCOPY (EGD) WITH PROPOFOL N/A 06/07/2017   Procedure: ESOPHAGOGASTRODUODENOSCOPY (EGD) WITH PROPOFOL;  Surgeon: [name], MD;  Location: [facility] ENDOSCOPY;  Service: Endoscopy;  Laterality: N/A;   IR CATHETER TUBE CHANGE  05/10/2020   REDUCTION MAMMAPLASTY Bilateral 1994    There were no vitals filed for this visit.   Subjective Assessment - 02/01/21 1255     Subjective Pt is continuing work on aggressive HEP at home for L>R upper limb mobility and LE strengthening and gait. Patient voices no new complaints at arrival to PT. She reports improving independence with transferring at high, specifically with stand-pivot transfers and sit to stand.    Patient is accompained by: Family member   husband   Pertinent History Patient is a 80 year old female s/p incomplete SCI. She had incomplete SCI following fall - C4 [place] D (DOI: 08/26/19). Primary activity limitations c difficulty walking, transferring, and completing independent functional mobility following incomplete SCI. She has undergone home health therapy for about one year. She and her husband reports she was able to transfer and ambulate with walker in home with PT walking beside her. Pt has practiced with front-wheel walker at home. 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 Patient reports using steady transfer aid for performing transfers chair to bed and W/C to standard chair. Her husband states that she is able to stand with steady device and can stand with her legs against the bed. Pt has ramp to get in her home for her  power wheelchair. Patient lives in one level home. Pt uses tub bench that she can utilize to slide into shower; her daughter/husband assists with bathing. Pt has Lucianne Lei that can accommodate her power chair at this time. Pt has concrete driveway to get up to her ramp. Pt has small threshold to get into her home - husband utilizes cardboard to get over threshold.    Limitations Walking;Standing;House  hold activities;Other (comment)   transferring, bed mobility, bladder management   Patient Stated Goals to be able to walk more independently               TREATMENT       Therapeutic Activities - patient education, repetitive task practice for improved performance of daily functional activities e.g. transferring   Sit to stand and gait to edge of table with modified-independent stand to sit prior to initiation of sitting/lying exercises; ambulation x 6 feet with 90-degree right turn prior to sitting edge of low mat  Bed mobility task practice: Supine to sit x 2 with MaxA for managing bilateral lower limbs after transferring sitting to sidelying. Performed bilateral rolling on low mat with ModA for upper and lower limbs as needed to promote independent bed mobility x 1. Utilized wedge to modify demand against gravity and performed bilateral rolling x 3 with patient able to complete R roll without assist, and L roll with MaxA for arm placement and ModA for trunk to complete roll  Sitting edge of bed for partial weightbearing onto bilateral LE, seated march with no UE support; x10 alternating; completed 2x10 with alternating UE/LE in same position     Gait with FWW in clinic; 1 x 120 feet, around gym; CGA/supervision throughout gait     Gait from // bars to power chair across clinic, including retrostep x 4 feet and standing pivot to exit // bars, ambulation x 30 feet to return to chair          *not today* Sit to stand from raised table, 22" height 2x10 In // bars: Standing march with UE support on // bars; 2x12 alternating Staggered stance weight shift; x20 bilat LE Standing heel raise/toe raise; x20 alternating Stand pivot transfer to/from wheelchair with front-wheeled walker with ModA for completion of sit to stand, supervision for pivot, and supervision for stand to sit. IND setup of power wheelchair prior to performance of transfer; x2             Neuromuscular  Re-education - for increased motor recruitment of major muscle groups with motor impairment, nervous system priming and exercises to promote trunk stability      Sitting on purple disc, catching and throwing with bilateral UE with small purple/pink ball (smaller size for increased demand on reaching and grasp accuracy), multi-directional reaching and bilateral grasping; x 3 min      In // bars: Unilateral upper extremity reaching and grasping in standing position with minimal UE support (touching on rail prn if losing balance); fine motor grasping and trunk rotation to place items into container on opposite side of body (small clothes pins, small cylinder and cubes of various sizes, nuts, spheres - from "HAND PT" packet); x 2 min    Standing toe tapping onto Airex pad to promote active dorsiflexion during LE swing; x20 bilateral LE    *next visit* Standing march with R upper extremity support only on single parallel bar, 2x10 alternating [full clearance of RLE, unweighting of LLE without clearance from floor]         *  not today* Seated march on purple disc with alternating UE/LE; x25 alternating Pedal feet with bilateral lower extremities; x25 alternating, no UE support Sitting on purple disc, cross-body "boxing" with sliders in therapist's hands as target ("cross" punch) to facilitate active cross body adduction and trunk rotation as needed for rolling and reaching/grasping tasks (including low and high punches today); x 25 alternating Sitting on purple disc, catching balloon and balloon tapping back to therapist - to facilitate active cross body adduction and trunk rotation as needed for rolling and reaching/grasping tasks (including low and high punches reaching today); x 2 minute catching, x 2 minutes balloon tapping Lower limb combined hip/knee flexion and dorsiflexion with pawing with VMS for improved dorsiflexor activation. 200 pps phase duration, ramp 1 sec, cycle time 5/5, 32 mA  intensity; x 8 minutes each lower extremity Bridging; x25, partial range  Seated forward/backward trunk lean on Dynadisc; x25 Lower limb combined hip/knee flexion and dorsiflexion in unsupported sitting position, tactile cueing for upright posture and to facilitate active dorsiflexion with hip/knee flexion; x25 alternating  Seated pelvic tilt on Dynadisc; alternating hip hike; x25 alternating L/R Grasping L UE with fine motor pulp to pulp grasp, grabbing bolts and placing into adjacent container requiring trunk lean to L (in sitting on dynadisc)x 4 min Lower limb combined hip/knee flexion and dorsiflexion in unsupported sitting position, tactile cueing for upright posture and to facilitate active dorsiflexion with hip/knee flexion; x25 alternating              ASSESSMENT Patient is able to complete unipedal stance in partial weightbearing position with pt seated edge of low table with table raised. Patient demonstrates ongoing improvement in toe clearance and accuracy with reaching and grasping. Increased demand of prehensile and reaching activity today with raised surface during standing unilateral reach and grasp activity. Pt is improving with independence with transferring. She is able to complete rolling with lower level of assistance with use of wedge (reclined position) to decrease demand against gravity. Pt is still significantly challenged with performance of supine to/from sit due to decreased trunk control and difficulty with  lower extremity management. Patient has remaining deficits in increased tone of wrist/digit flexors (L>RUE), decreased L>R shoulder AROM, impaired trunk control, decreased LE strength and motor recruitment, gait deficits/deviations (most notably difficulty with L>R toe clearance during gait), and impaired postural stability in standing. Patient will benefit from continued skilled therapeutic intervention to address the above deficits as needed for improved function and  QoL.        PT Short Term Goals - 01/15/21 1309       PT SHORT TERM GOAL #1   Title Patient will perform stand-pivot transfer from chair to bed with supervision level of assist and ModI setup of power chair as needed for transferring at home    Baseline 11/26/20: Max assist for setup and mod assist to perform sit to stand, unsteady pivot prior to performing stand to sit.    12/23/20: Patient is able to setup power chair for transfer ModI. Mod to Max assist for sit to stand. CGA level of assist for standing pivot and stand to sit on low mat.   01/15/21: Setup of power chair without assist; modified independent pivot and stand-to-sit; ModA for initial set to stand prior to pivot.    Time 4    Period Weeks    Status Partially Met    Target Date 02/12/21      PT SHORT TERM GOAL #2   Title  Patient will perform standing toe tap to 3-inch surface (Airex pad) with bilateral upper extremity support indicative of improved ability to clear feet from floor during stepping and improved weight shift to each LE with AD use    Baseline 11/26/20: Limited toe clearance during bilateral LE swing phase with front-wheeled walker.   12/23/20: performed at previous follow-up with bilateral UE support.    Time 3    Period Weeks    Status Achieved    Target Date 12/17/20               PT Long Term Goals - 01/15/21 1258       PT LONG TERM GOAL #1   Title Patient will demonstrate improved function as evidenced by a score of 35 on FOTO measure for full participation in activities at home and in the community.    Baseline 11/26/20: FOTO 12.   12/23/20: FOTO 24.    01/15/21: FOTO 23    Time 16    Period Weeks    Status Partially Met    Target Date 03/18/21      PT LONG TERM GOAL #2   Title Patient will ambulate for 150 feet without LOB, with sufficient toe clearance to prevent loss of gait stability, and c proper AD placement with least-restrictive assistive device with supervision level of assist    Baseline  11/26/20: Gait x 40 feet with CGA.   12/23/20: CGA with dec toe clearance L>RLE.   01/15/21: Ambulated 160 feet without LOB, proper AD placement; good toe clearance with first 80 feet, but decrased L>R toe clearanc with fatigue.    Time 12    Period Weeks    Status Partially Met    Target Date 02/19/21      PT LONG TERM GOAL #3   Title Patient will perform independent re-positioning in chair with use of closed-chain triceps on armrests as needed for independent wheelchair mobility with no verbal cueing or tactile cueing required    Baseline 11/26/20: Dependent re-positioning in wheelchair.   12/23/20: Performed with feet on fold-out platform of her power wheelchair IND.    Time 8    Period Weeks    Status Achieved    Target Date 12/23/20      PT LONG TERM GOAL #4   Title Patient will perform simulated step-over task for clearing doorway threshold with use of front-wheeled walker as needed for entering/exiting home with supervision level of assistance and no LOB    Baseline 11/26/20: Pt currently uses power wheelchair to negotiate threshold into home.   12/23/20: Deferred.  01/15/21: Deferred    Time 16    Period Weeks    Status Deferred    Target Date 03/18/21                   Plan - 02/01/21 1527     Clinical Impression Statement Patient is able to complete unipedal stance in partial weightbearing position with pt seated edge of low table with table raised. Patient demonstrates ongoing improvement in toe clearance and accuracy with reaching and grasping. Increased demand of prehensile and reaching activity today with raised surface during standing unilateral reach and grasp activity. Pt is improving with independence with transferring. She is able to complete rolling with lower level of assistance with use of wedge (reclined position) to decrease demand against gravity. Pt is still significantly challenged with performance of supine to/from sit due to decreased trunk control and  difficulty with  lower  extremity management. Patient has remaining deficits in increased tone of wrist/digit flexors (L>RUE), decreased L>R shoulder AROM, impaired trunk control, decreased LE strength and motor recruitment, gait deficits/deviations (most notably difficulty with L>R toe clearance during gait), and impaired postural stability in standing. Patient will benefit from continued skilled therapeutic intervention to address the above deficits as needed for improved function and QoL.    Personal Factors and Comorbidities Age;Comorbidity 3+;Time since onset of injury/illness/exacerbation    Comorbidities GERD, pre-diabetes, obesity, gout, s/p C3-7 ACDF    Examination-Activity Limitations Bathing;Continence;Reach Overhead;Stairs;Bed Mobility;Dressing;Self Feeding;Stand;Hygiene/Grooming;Toileting;Transfers;Locomotion Level    Examination-Participation Restrictions Community Activity;Yard Work;Laundry;Cleaning;Shop    Stability/Clinical Decision Making Evolving/Moderate complexity    Rehab Potential Fair    PT Frequency Other (comment)   2-3x/week   PT Duration Other (comment)   16 weeks   PT Treatment/Interventions Electrical Stimulation;Gait training;Stair training;Functional mobility training;Therapeutic activities;Therapeutic exercise;Balance training;Neuromuscular re-education;Wheelchair mobility training;Patient/family education    PT Next Visit Plan Gait re-training, task practice for transferring, LE strengthening, UE AROM and strengthening to assist with functional mobility and transfers    PT Home Exercise Plan Pt continuing with home health exercises. Pt continuing with task practice for rolling, scooting, bridging in bed; walking with CGA with FWW; reaching and grasping with LUE. Weight-shifting and pre-gait activities with decreasing UE support.    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, initial encounter [facility])     Problem List Patient Active Problem List   Diagnosis Date Noted   Generalized anxiety disorder 01/24/2021   Suspected deep tissue injury 10/25/2020   Wheelchair dependence 11/27/2019   Spasticity 11/27/2019   Hyperkalemia    Steroid-induced hyperglycemia    Prediabetes    Neurogenic bowel    Neurogenic bladder    Neuropathic pain    Quadriplegia ([place]) 08/31/2019   S/P cervical spinal fusion 08/28/2019   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 [place], [place], [address] Phone: [phone]   Fax:  [phone]  Name: Meredith Roach MRN: [id number] Date of Birth: Dec 26, 1940

## 2021-02-03 ENCOUNTER — Ambulatory Visit: Payer: Medicare PPO | Admitting: Physical Therapy

## 2021-02-03 ENCOUNTER — Other Ambulatory Visit: Payer: Self-pay

## 2021-02-03 DIAGNOSIS — R262 Difficulty in walking, not elsewhere classified: Secondary | ICD-10-CM | POA: Diagnosis not present

## 2021-02-03 DIAGNOSIS — M6281 Muscle weakness (generalized): Secondary | ICD-10-CM

## 2021-02-03 DIAGNOSIS — S14123A 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, initial encounter: Secondary | ICD-10-CM

## 2021-02-03 NOTE — Therapy (Signed)
[facility] [address] [place], [place], [address] Phone: [phone]   Fax:  [phone]  Physical Therapy Treatment  Patient Details  Name: Meredith Roach MRN: [id number] Date of Birth: [birth year] Referring Provider (PT): Meredith Heys, MD   Encounter Date: 02/03/2021   PT End of Session - 02/03/21 1529     Visit Number 28    Number of Visits 21    Date for PT Re-Evaluation 03/18/21    Authorization Time Period Cert 11/29/27-93/71/69, last progress note 01/15/21    Progress Note Due on Visit 30    PT Start Time 1502    PT Stop Time 1552    PT Time Calculation (min) 50 min    Equipment Utilized During Treatment Gait belt   Pt uses power wheelchair, clinic's front-wheeled walker utilized for gait   Activity Tolerance Patient tolerated treatment well    Behavior During Therapy WFL for tasks assessed/performed             Past Medical History:  Diagnosis Date   Arthritis    GERD (gastroesophageal reflux disease)    Gout    Neuromuscular disorder ([place])    Pre-diabetes     Past Surgical History:  Procedure Laterality Date   ANTERIOR CERVICAL DECOMPRESSION/DISCECTOMY FUSION 4 LEVELS N/A 08/28/2019   Procedure: ANTERIOR CERVICAL DECOMPRESSION/DISCECTOMY FUSION CERVICALTHREE-FOUR CERVICAL,FOUR-FIVE,CERVICAL FIVE-SIX,CERVICAL SIX-SEVEN.;  Surgeon: Meredith Moore, MD;  Location: [facility];  Service: Neurosurgery;  Laterality: N/A;  ANTERIOR   BACK SURGERY     CATARACT EXTRACTION W/PHACO Right 03/31/2018   Procedure: CATARACT EXTRACTION PHACO AND INTRAOCULAR LENS PLACEMENT ([place]);  Surgeon: Meredith Meiers, MD;  Location: [facility];  Service: Ophthalmology;  Laterality: Right;  [place] 00:39.7 CDE 4.35 Fluid Pack Lot # [id number]   CHOLECYSTECTOMY  1995   COLONOSCOPY WITH PROPOFOL N/A 06/07/2017   Procedure: COLONOSCOPY WITH PROPOFOL;  Surgeon: Meredith Silvas, MD;  Location: [facility];  Service: Endoscopy;  Laterality: N/A;    ESOPHAGOGASTRODUODENOSCOPY (EGD) WITH PROPOFOL N/A 06/07/2017   Procedure: ESOPHAGOGASTRODUODENOSCOPY (EGD) WITH PROPOFOL;  Surgeon: Meredith Silvas, MD;  Location: [facility];  Service: Endoscopy;  Laterality: N/A;   IR CATHETER TUBE CHANGE  05/10/2020   REDUCTION MAMMAPLASTY Bilateral 1994    There were no vitals filed for this visit.   Subjective Assessment - 02/03/21 1637     Subjective Pt reports ongoing difficulty with bed mobility at home even with raised head of bed. She states that rolling to L is easier than rolling to R. She reports that her feet also slide on bedsheet, and this precludes using LEs to push into roll. Patient reports compliance with her HEP. She reports notable "tightness" in her L lower limb.    Patient is accompained by: Family member   husband   Pertinent History Patient is a 80 year old female s/p incomplete SCI. She had incomplete SCI following fall - C4 [place] D (DOI: 08/26/19). Primary activity limitations c difficulty walking, transferring, and completing independent functional mobility following incomplete SCI. She has undergone home health therapy for about one year. She and her husband reports she was able to transfer and ambulate with walker in home with PT walking beside her. Pt has practiced with front-wheel walker at home. 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 Patient reports using steady transfer aid for performing transfers chair to bed and W/C to standard chair. Her husband states that she is able to stand with steady device and can stand  with her legs against the bed. Pt has ramp to get in her home for her power wheelchair. Patient lives in one level home. Pt uses tub bench that she can utilize to slide into shower; her daughter/husband assists with bathing. Pt has Lucianne Lei that can accommodate her power chair at this time. Pt has concrete driveway to get up to her ramp. Pt has small threshold to get into her home - husband utilizes cardboard to  get over threshold.    Limitations Walking;Standing;House hold activities;Other (comment)   transferring, bed mobility, bladder management   Patient Stated Goals to be able to walk more independently               TREATMENT       Therapeutic Activities - patient education, repetitive task practice for improved performance of daily functional activities e.g. transferring   Sit to stand and gait to edge of table with modified-independent stand to sit prior to initiation of sitting/lying exercises; ambulation x 6 feet with 90-degree right turn prior to sitting edge of low mat   Sitting edge of bed for partial weightbearing onto bilateral LE, seated march with no UE support; x10 alternating; completed 2x10 with alternating UE/LE in same position  Sit to stand from raised table, 22" height 2x10   Gait with FWW in clinic; 1 x 240 feet, 3 consecutive laps around gym; CGA/supervision throughout gait     Gait from // bars to power chair across clinic, including retrostep x 4 feet and standing pivot to exit // bars, ambulation x 30 feet to return to chair          *not today* Bed mobility task practice: Supine to sit x 2 with MaxA for managing bilateral lower limbs after transferring sitting to sidelying. Performed bilateral rolling on low mat with ModA for upper and lower limbs as needed to promote independent bed mobility x 1. Utilized wedge to modify demand against gravity and performed bilateral rolling x 3 with patient able to complete R roll without assist, and L roll with MaxA for arm placement and ModA for trunk to complete roll In // bars: Standing march with UE support on // bars; 2x12 alternating Staggered stance weight shift; x20 bilat LE Standing heel raise/toe raise; x20 alternating Stand pivot transfer to/from wheelchair with front-wheeled walker with ModA for completion of sit to stand, supervision for pivot, and supervision for stand to sit. IND setup of power wheelchair  prior to performance of transfer; x2              Neuromuscular Re-education - for increased motor recruitment of major muscle groups with motor impairment, nervous system priming and exercises to promote trunk stability      Sitting on purple disc, catching and throwing with bilateral UE with small purple/pink ball (smaller size for increased demand on reaching and grasp accuracy), multi-directional reaching and bilateral grasping; x 4 min       In // bars: Unilateral upper extremity reaching and grasping in standing position with minimal UE support, with bilateral fine motor task, twisting bolt onto nut (bolt c LUE, nut c RUE); x 5 min     Standing march with R upper extremity support only on single parallel bar, 2x10 alternating [full clearance of RLE, unweighting of LLE with inconsistent clearance from floor]         *not today* Standing toe tapping onto Airex pad to promote active dorsiflexion during LE swing; x20 bilateral LE Unilateral upper extremity reaching  and grasping in standing position with minimal UE support (touching on rail prn if losing balance); fine motor grasping and trunk rotation to place items into container on opposite side of body (small clothes pins, small cylinder and cubes of various sizes, nuts, spheres - from "HAND PT" packet); x 2 min  Seated march on purple disc with alternating UE/LE; x25 alternating Sitting on purple disc, catching balloon and balloon tapping back to therapist - to facilitate active cross body adduction and trunk rotation as needed for rolling and reaching/grasping tasks (including low and high punches reaching today); x 2 minute catching, x 2 minutes balloon tapping Lower limb combined hip/knee flexion and dorsiflexion with pawing with VMS for improved dorsiflexor activation. 200 pps phase duration, ramp 1 sec, cycle time 5/5, 32 mA intensity; x 8 minutes each lower extremity Bridging; x25, partial range  Seated forward/backward  trunk lean on Dynadisc; x25 Lower limb combined hip/knee flexion and dorsiflexion in unsupported sitting position, tactile cueing for upright posture and to facilitate active dorsiflexion with hip/knee flexion; x25 alternating  Seated pelvic tilt on Dynadisc; alternating hip hike; x25 alternating L/R              ASSESSMENT Physical therapy focused on standing activity, gait, and weight-shifting work to improve full weight acceptance without LOB or stability in bilat LE. Patient is able to weight shift to each LE well with partial weightbearing while seated on tall surface and with bilateral upper limb support. She is still significantly challenged with L hip flexion with unilateral upper extremity support. She is able to fasten belt on her wheelchair independently today and progress significantly with demand of fine motor dexterity with patient twisting nut onto bolt with bilateral upper limbs in standing (standing inside of walker). Patient has remaining deficits in increased tone of wrist/digit flexors (L>RUE), decreased L>R shoulder AROM, impaired trunk control, decreased LE strength and motor recruitment, gait deficits/deviations (most notably difficulty with L>R toe clearance during gait), and impaired postural stability in standing. Patient will benefit from continued skilled therapeutic intervention to address the above deficits as needed for improved function and QoL.             PT Short Term Goals - 01/15/21 1309       PT SHORT TERM GOAL #1   Title Patient will perform stand-pivot transfer from chair to bed with supervision level of assist and ModI setup of power chair as needed for transferring at home    Baseline 11/26/20: Max assist for setup and mod assist to perform sit to stand, unsteady pivot prior to performing stand to sit.    12/23/20: Patient is able to setup power chair for transfer ModI. Mod to Max assist for sit to stand. CGA level of assist for standing pivot and stand  to sit on low mat.   01/15/21: Setup of power chair without assist; modified independent pivot and stand-to-sit; ModA for initial set to stand prior to pivot.    Time 4    Period Weeks    Status Partially Met    Target Date 02/12/21      PT SHORT TERM GOAL #2   Title Patient will perform standing toe tap to 3-inch surface (Airex pad) with bilateral upper extremity support indicative of improved ability to clear feet from floor during stepping and improved weight shift to each LE with AD use    Baseline 11/26/20: Limited toe clearance during bilateral LE swing phase with front-wheeled walker.   12/23/20: performed  at previous follow-up with bilateral UE support.    Time 3    Period Weeks    Status Achieved    Target Date 12/17/20               PT Long Term Goals - 01/15/21 1258       PT LONG TERM GOAL #1   Title Patient will demonstrate improved function as evidenced by a score of 35 on FOTO measure for full participation in activities at home and in the community.    Baseline 11/26/20: FOTO 12.   12/23/20: FOTO 24.    01/15/21: FOTO 23    Time 16    Period Weeks    Status Partially Met    Target Date 03/18/21      PT LONG TERM GOAL #2   Title Patient will ambulate for 150 feet without LOB, with sufficient toe clearance to prevent loss of gait stability, and c proper AD placement with least-restrictive assistive device with supervision level of assist    Baseline 11/26/20: Gait x 40 feet with CGA.   12/23/20: CGA with dec toe clearance L>RLE.   01/15/21: Ambulated 160 feet without LOB, proper AD placement; good toe clearance with first 80 feet, but decrased L>R toe clearanc with fatigue.    Time 12    Period Weeks    Status Partially Met    Target Date 02/19/21      PT LONG TERM GOAL #3   Title Patient will perform independent re-positioning in chair with use of closed-chain triceps on armrests as needed for independent wheelchair mobility with no verbal cueing or tactile cueing  required    Baseline 11/26/20: Dependent re-positioning in wheelchair.   12/23/20: Performed with feet on fold-out platform of her power wheelchair IND.    Time 8    Period Weeks    Status Achieved    Target Date 12/23/20      PT LONG TERM GOAL #4   Title Patient will perform simulated step-over task for clearing doorway threshold with use of front-wheeled walker as needed for entering/exiting home with supervision level of assistance and no LOB    Baseline 11/26/20: Pt currently uses power wheelchair to negotiate threshold into home.   12/23/20: Deferred.  01/15/21: Deferred    Time 16    Period Weeks    Status Deferred    Target Date 03/18/21                   Plan - 02/03/21 1645     Clinical Impression Statement Physical therapy focused on standing activity, gait, and weight-shifting work to improve full weight acceptance without LOB or stability in bilat LE. Patient is able to weight shift to each LE well with partial weightbearing while seated on tall surface and with bilateral upper limb support. She is still significantly challenged with L hip flexion with unilateral upper extremity support. She is able to fasten belt on her wheelchair independently today and progress significantly with demand of fine motor dexterity with patient twisting nut onto bolt with bilateral upper limbs in standing (standing inside of walker). Patient has remaining deficits in increased tone of wrist/digit flexors (L>RUE), decreased L>R shoulder AROM, impaired trunk control, decreased LE strength and motor recruitment, gait deficits/deviations (most notably difficulty with L>R toe clearance during gait), and impaired postural stability in standing. Patient will benefit from continued skilled therapeutic intervention to address the above deficits as needed for improved function and QoL.  Personal Factors and Comorbidities Age;Comorbidity 3+;Time since onset of injury/illness/exacerbation    Comorbidities  GERD, pre-diabetes, obesity, gout, s/p C3-7 ACDF    Examination-Activity Limitations Bathing;Continence;Reach Overhead;Stairs;Bed Mobility;Dressing;Self Feeding;Stand;Hygiene/Grooming;Toileting;Transfers;Locomotion Level    Examination-Participation Restrictions Community Activity;Yard Work;Laundry;Cleaning;Shop    Stability/Clinical Decision Making Evolving/Moderate complexity    Rehab Potential Fair    PT Frequency Other (comment)   2-3x/week   PT Duration Other (comment)   16 weeks   PT Treatment/Interventions Electrical Stimulation;Gait training;Stair training;Functional mobility training;Therapeutic activities;Therapeutic exercise;Balance training;Neuromuscular re-education;Wheelchair mobility training;Patient/family education    PT Next Visit Plan Gait re-training, task practice for transferring, LE strengthening, UE AROM and strengthening to assist with functional mobility and transfers    PT Home Exercise Plan Pt continuing with home health exercises. Pt continuing with task practice for rolling, scooting, bridging in bed; walking with CGA with FWW; reaching and grasping with LUE. Weight-shifting and pre-gait activities with decreasing UE support.    Consulted and Agree with Plan of Care Patient;Family member/caregiver    Family Member Consulted Husband             Patient will benefit from skilled therapeutic intervention in order to improve the following deficits and impairments:  Abnormal gait, Decreased balance, Decreased mobility, Impaired sensation, Decreased strength  Visit Diagnosis: No diagnosis found.     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 [place], [place], [address] Phone: [phone]   Fax:  [phone]  Name: Meredith Roach MRN: [id number] Date of Birth: 08-21-1940

## 2021-02-05 ENCOUNTER — Encounter: Payer: Self-pay | Admitting: Physical Therapy

## 2021-02-05 ENCOUNTER — Ambulatory Visit: Payer: Medicare PPO | Admitting: Physical Therapy

## 2021-02-05 ENCOUNTER — Other Ambulatory Visit: Payer: Self-pay

## 2021-02-05 DIAGNOSIS — R262 Difficulty in walking, not elsewhere classified: Secondary | ICD-10-CM

## 2021-02-05 DIAGNOSIS — M6281 Muscle weakness (generalized): Secondary | ICD-10-CM

## 2021-02-05 DIAGNOSIS — S14123A 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, initial encounter: Secondary | ICD-10-CM

## 2021-02-05 NOTE — Therapy (Signed)
[facility] [address] [place], [place], [address] Phone: [phone]   Fax:  [phone]  Physical Therapy Treatment  Patient Details  Name: Meredith Roach MRN: [id number] Date of Birth: [birth year] Referring Provider (PT): [name], MD   Encounter Date: 02/05/2021   PT End of Session - 02/07/21 1401     Visit Number 29    Number of Visits 53    Date for PT Re-Evaluation 03/18/21    Authorization Time Period Cert 07/29/38-81/44/81, last progress note 01/15/21    Progress Note Due on Visit 30    PT Start Time 1150    PT Stop Time 1237    PT Time Calculation (min) 47 min    Equipment Utilized During Treatment Gait belt   Pt uses power wheelchair, clinic's front-wheeled walker utilized for gait   Activity Tolerance Patient tolerated treatment well    Behavior During Therapy [facility] for tasks assessed/performed             Past Medical History:  Diagnosis Date   Arthritis    GERD (gastroesophageal reflux disease)    Gout    Neuromuscular disorder ([place])    Pre-diabetes     Past Surgical History:  Procedure Laterality Date   ANTERIOR CERVICAL DECOMPRESSION/DISCECTOMY FUSION 4 LEVELS N/A 08/28/2019   Procedure: ANTERIOR CERVICAL DECOMPRESSION/DISCECTOMY FUSION CERVICALTHREE-FOUR CERVICAL,FOUR-FIVE,CERVICAL FIVE-SIX,CERVICAL SIX-SEVEN.;  Surgeon: [name], MD;  Location: [place];  Service: Neurosurgery;  Laterality: N/A;  ANTERIOR   BACK SURGERY     CATARACT EXTRACTION W/PHACO Right 03/31/2018   Procedure: CATARACT EXTRACTION PHACO AND INTRAOCULAR LENS PLACEMENT ([place]);  Surgeon: [name], MD;  Location: [facility];  Service: Ophthalmology;  Laterality: Right;  [place] 00:39.7 CDE 4.35 Fluid Pack Lot # [id number]   CHOLECYSTECTOMY  1995   COLONOSCOPY WITH PROPOFOL N/A 06/07/2017   Procedure: COLONOSCOPY WITH PROPOFOL;  Surgeon: [name], MD;  Location: [facility] ENDOSCOPY;  Service: Endoscopy;  Laterality: N/A;    ESOPHAGOGASTRODUODENOSCOPY (EGD) WITH PROPOFOL N/A 06/07/2017   Procedure: ESOPHAGOGASTRODUODENOSCOPY (EGD) WITH PROPOFOL;  Surgeon: [name], MD;  Location: [facility] ENDOSCOPY;  Service: Endoscopy;  Laterality: N/A;   IR CATHETER TUBE CHANGE  05/10/2020   REDUCTION MAMMAPLASTY Bilateral 1994    There were no vitals filed for this visit.   Subjective Assessment - 02/07/21 1401     Subjective Patient reports notalbe tightness in left hand and L lower limb. She states that L hand feels very cold at home and she wraps it in blanket and mitten as needed. Patient reports continued work on ambulation with FWW at home and bed mobility/transferring.    Patient is accompained by: Family member   husband   Pertinent History Patient is a 80 year old female s/p incomplete SCI. She had incomplete SCI following fall - C4 [place] D (DOI: 08/26/19). Primary activity limitations c difficulty walking, transferring, and completing independent functional mobility following incomplete SCI. She has undergone home health therapy for about one year. She and her husband reports she was able to transfer and ambulate with walker in home with PT walking beside her. Pt has practiced with front-wheel walker at home. 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 Patient reports using steady transfer aid for performing transfers chair to bed and W/C to standard chair. Her husband states that she is able to stand with steady device and can stand with her legs against the bed. Pt has ramp to get in her home for her  power wheelchair. Patient lives in one level home. Pt uses tub bench that she can utilize to slide into shower; her daughter/husband assists with bathing. Pt has Lucianne Lei that can accommodate her power chair at this time. Pt has concrete driveway to get up to her ramp. Pt has small threshold to get into her home - husband utilizes cardboard to get over threshold.    Limitations Walking;Standing;House hold activities;Other  (comment)   transferring, bed mobility, bladder management   Patient Stated Goals to be able to walk more independently               TREATMENT       Therapeutic Activities - patient education, repetitive task practice for improved performance of daily functional activities e.g. transferring   Sit to stand and gait to edge of table with modified-independent stand to sit prior to initiation of sitting/lying exercises; ambulation x 6 feet with 90-degree right turn prior to sitting edge of low mat   Sitting edge of bed for partial weightbearing onto bilateral LE, seated march with no UE support; 2x20 with alternating UE/LE    Sit to stand from raised table, 22" height 2x10   Gait with FWW in clinic; 1 x 240 feet, 3 consecutive laps around gym; CGA/supervision throughout gait [cueing for toe clearance]     Gait from // bars to power chair across clinic, including retrostep x 4 feet and standing pivot to exit // bars, ambulation x 30 feet to return to chair          *not today* Bed mobility task practice: Supine to sit x 2 with MaxA for managing bilateral lower limbs after transferring sitting to sidelying. Performed bilateral rolling on low mat with ModA for upper and lower limbs as needed to promote independent bed mobility x 1. Utilized wedge to modify demand against gravity and performed bilateral rolling x 3 with patient able to complete R roll without assist, and L roll with MaxA for arm placement and ModA for trunk to complete roll In // bars: Standing march with UE support on // bars; 2x12 alternating Staggered stance weight shift; x20 bilat LE Standing heel raise/toe raise; x20 alternating Stand pivot transfer to/from wheelchair with front-wheeled walker with ModA for completion of sit to stand, supervision for pivot, and supervision for stand to sit. IND setup of power wheelchair prior to performance of transfer; x2              Neuromuscular Re-education - for  increased motor recruitment of major muscle groups with motor impairment, nervous system priming and exercises to promote trunk stability    Sitting on purple disc, catching and throwing with bilateral UE with small purple/pink ball (smaller size for increased demand on reaching and grasp accuracy), multi-directional reaching and bilateral grasping; x 4 min     In // bars: Unilateral upper extremity reaching and grasping in standing position with minimal UE support, with bilateral fine motor task, twisting bolt onto nut (bolt c LUE, nut c RUE); x 5 min   Rolling Theraputty (yellow Theraputty) in standing to facilitate improved trunk control and lower limb support without UE assist and digit/MCP extension; x 2 minutes   Standing toe tapping onto Airex pad to promote active dorsiflexion during LE swing; x20 bilateral LE  Standing step-over single line on floor with alternating LE; 2x10 alternating         *not today* Standing march with R upper extremity support only on single parallel bar, 2x10 alternating [  full clearance of RLE, unweighting of LLE with inconsistent clearance from floor] Unilateral upper extremity reaching and grasping in standing position with minimal UE support (touching on rail prn if losing balance); fine motor grasping and trunk rotation to place items into container on opposite side of body (small clothes pins, small cylinder and cubes of various sizes, nuts, spheres - from "HAND PT" packet); x 2 min  Seated march on purple disc with alternating UE/LE; x25 alternating Sitting on purple disc, catching balloon and balloon tapping back to therapist - to facilitate active cross body adduction and trunk rotation as needed for rolling and reaching/grasping tasks (including low and high punches reaching today); x 2 minute catching, x 2 minutes balloon tapping Lower limb combined hip/knee flexion and dorsiflexion with pawing with VMS for improved dorsiflexor activation. 200 pps  phase duration, ramp 1 sec, cycle time 5/5, 32 mA intensity; x 8 minutes each lower extremity Bridging; x25, partial range  Seated forward/backward trunk lean on Dynadisc; x25 Lower limb combined hip/knee flexion and dorsiflexion in unsupported sitting position, tactile cueing for upright posture and to facilitate active dorsiflexion with hip/knee flexion; x25 alternating  Seated pelvic tilt on Dynadisc; alternating hip hike; x25 alternating L/R               ASSESSMENT Patient is able to significantly progress with motor performance of fine motor task (twisting nuts onto bolts with L upper limb grasping bolt and R upper limb grasping nut) in standing with 5 nuts/bolts completed versus 2 last session. She is able to progress stepping and targeting drill in // bars to include single-line stepover with bilateral lower limbs. Patient demonstrates L toe drag toward end of second set. She has ongoing improvement in trunk control as exhibited by ability to maintain upright position on compliant sitting surface and during static standing tasks. Patient has remaining deficits in increased tone of wrist/digit flexors (L>RUE), decreased L>R shoulder AROM, impaired trunk control, decreased LE strength and motor recruitment, gait deficits/deviations (most notably difficulty with L>R toe clearance during gait), and impaired postural stability in standing. Patient will benefit from continued skilled therapeutic intervention to address the above deficits as needed for improved function and QoL.       PT Short Term Goals - 01/15/21 1309       PT SHORT TERM GOAL #1   Title Patient will perform stand-pivot transfer from chair to bed with supervision level of assist and ModI setup of power chair as needed for transferring at home    Baseline 11/26/20: Max assist for setup and mod assist to perform sit to stand, unsteady pivot prior to performing stand to sit.    12/23/20: Patient is able to setup power chair for  transfer ModI. Mod to Max assist for sit to stand. CGA level of assist for standing pivot and stand to sit on low mat.   01/15/21: Setup of power chair without assist; modified independent pivot and stand-to-sit; ModA for initial set to stand prior to pivot.    Time 4    Period Weeks    Status Partially Met    Target Date 02/12/21      PT SHORT TERM GOAL #2   Title Patient will perform standing toe tap to 3-inch surface (Airex pad) with bilateral upper extremity support indicative of improved ability to clear feet from floor during stepping and improved weight shift to each LE with AD use    Baseline 11/26/20: Limited toe clearance during bilateral LE swing  phase with front-wheeled walker.   12/23/20: performed at previous follow-up with bilateral UE support.    Time 3    Period Weeks    Status Achieved    Target Date 12/17/20               PT Long Term Goals - 01/15/21 1258       PT LONG TERM GOAL #1   Title Patient will demonstrate improved function as evidenced by a score of 35 on FOTO measure for full participation in activities at home and in the community.    Baseline 11/26/20: FOTO 12.   12/23/20: FOTO 24.    01/15/21: FOTO 23    Time 16    Period Weeks    Status Partially Met    Target Date 03/18/21      PT LONG TERM GOAL #2   Title Patient will ambulate for 150 feet without LOB, with sufficient toe clearance to prevent loss of gait stability, and c proper AD placement with least-restrictive assistive device with supervision level of assist    Baseline 11/26/20: Gait x 40 feet with CGA.   12/23/20: CGA with dec toe clearance L>RLE.   01/15/21: Ambulated 160 feet without LOB, proper AD placement; good toe clearance with first 80 feet, but decrased L>R toe clearanc with fatigue.    Time 12    Period Weeks    Status Partially Met    Target Date 02/19/21      PT LONG TERM GOAL #3   Title Patient will perform independent re-positioning in chair with use of closed-chain triceps on  armrests as needed for independent wheelchair mobility with no verbal cueing or tactile cueing required    Baseline 11/26/20: Dependent re-positioning in wheelchair.   12/23/20: Performed with feet on fold-out platform of her power wheelchair IND.    Time 8    Period Weeks    Status Achieved    Target Date 12/23/20      PT LONG TERM GOAL #4   Title Patient will perform simulated step-over task for clearing doorway threshold with use of front-wheeled walker as needed for entering/exiting home with supervision level of assistance and no LOB    Baseline 11/26/20: Pt currently uses power wheelchair to negotiate threshold into home.   12/23/20: Deferred.  01/15/21: Deferred    Time 16    Period Weeks    Status Deferred    Target Date 03/18/21                   Plan - 02/07/21 1407     Clinical Impression Statement Patient is able to significantly progress with motor performance of fine motor task (twisting nuts onto bolts with L upper limb grasping bolt and R upper limb grasping nut) in standing with 5 nuts/bolts completed versus 2 last session. She is able to progress stepping and targeting drill in // bars to include single-line stepover with bilateral lower limbs. Patient demonstrates L toe drag toward end of second set. She has ongoing improvement in trunk control as exhibited by ability to maintain upright position on compliant sitting surface and during static standing tasks. Patient has remaining deficits in increased tone of wrist/digit flexors (L>RUE), decreased L>R shoulder AROM, impaired trunk control, decreased LE strength and motor recruitment, gait deficits/deviations (most notably difficulty with L>R toe clearance during gait), and impaired postural stability in standing. Patient will benefit from continued skilled therapeutic intervention to address the above deficits as needed for improved function  and QoL.    Personal Factors and Comorbidities Age;Comorbidity 3+;Time since onset  of injury/illness/exacerbation    Comorbidities GERD, pre-diabetes, obesity, gout, s/p C3-7 ACDF    Examination-Activity Limitations Bathing;Continence;Reach Overhead;Stairs;Bed Mobility;Dressing;Self Feeding;Stand;Hygiene/Grooming;Toileting;Transfers;Locomotion Level    Examination-Participation Restrictions Community Activity;Yard Work;Laundry;Cleaning;Shop    Stability/Clinical Decision Making Evolving/Moderate complexity    Rehab Potential Fair    PT Frequency Other (comment)   2-3x/week   PT Duration Other (comment)   16 weeks   PT Treatment/Interventions Electrical Stimulation;Gait training;Stair training;Functional mobility training;Therapeutic activities;Therapeutic exercise;Balance training;Neuromuscular re-education;Wheelchair mobility training;Patient/family education    PT Next Visit Plan Gait re-training, task practice for transferring, LE strengthening, UE AROM and strengthening to assist with functional mobility and transfers. Weight-shifting and pre-gait activities with decreasing UE support.    PT Home Exercise Plan Pt continuing with home health exercises. Pt continuing with task practice for rolling, scooting, bridging in bed; walking with CGA with FWW; reaching and grasping with LUE.    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, initial encounter [facility])     Problem List Patient Active Problem List   Diagnosis Date Noted   Generalized anxiety disorder 01/24/2021   Suspected deep tissue injury 10/25/2020   Wheelchair dependence  11/27/2019   Spasticity 11/27/2019   Hyperkalemia    Steroid-induced hyperglycemia    Prediabetes    Neurogenic bowel    Neurogenic bladder    Neuropathic pain    Quadriplegia ([place]) 08/31/2019   S/P cervical spinal fusion 08/28/2019   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 [place], [place], [address] Phone: [phone]   Fax:  [phone]  Name: Meredith Roach MRN: [id number] Date of Birth: 21-Dec-1940

## 2021-02-10 ENCOUNTER — Ambulatory Visit: Payer: Medicare PPO | Admitting: Physical Therapy

## 2021-02-10 ENCOUNTER — Other Ambulatory Visit: Payer: Self-pay

## 2021-02-10 ENCOUNTER — Encounter: Payer: Self-pay | Admitting: Physical Therapy

## 2021-02-10 DIAGNOSIS — R262 Difficulty in walking, not elsewhere classified: Secondary | ICD-10-CM

## 2021-02-10 DIAGNOSIS — S14123A 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, initial encounter: Secondary | ICD-10-CM

## 2021-02-10 DIAGNOSIS — M6281 Muscle weakness (generalized): Secondary | ICD-10-CM

## 2021-02-10 NOTE — Therapy (Signed)
[facility] [address] [place], [place], [address] Phone: [phone]   Fax:  [phone]  Physical Therapy Treatment/ Physical Therapy Progress Note/Re-certification   Dates of reporting period  01/15/21   to   02/10/21    Patient Details  Name: Meredith Roach MRN: [id number] Date of Birth: [birth year] Referring Provider (PT): [name], MD   Encounter Date: 02/10/2021   PT End of Session - 02/10/21 1254     Visit Number 30    Number of Visits 34    Date for PT Re-Evaluation 03/18/21    Authorization Time Period Cert 04/21/30-35/57/32, last progress note 01/15/21    Progress Note Due on Visit 30    PT Start Time 1145    PT Stop Time 1236    PT Time Calculation (min) 51 min    Equipment Utilized During Treatment Gait belt   Pt uses power wheelchair, clinic's front-wheeled walker utilized for gait   Activity Tolerance Patient tolerated treatment well    Behavior During Therapy WFL for tasks assessed/performed             Past Medical History:  Diagnosis Date   Arthritis    GERD (gastroesophageal reflux disease)    Gout    Neuromuscular disorder ([place])    Pre-diabetes     Past Surgical History:  Procedure Laterality Date   ANTERIOR CERVICAL DECOMPRESSION/DISCECTOMY FUSION 4 LEVELS N/A 08/28/2019   Procedure: ANTERIOR CERVICAL DECOMPRESSION/DISCECTOMY FUSION CERVICALTHREE-FOUR CERVICAL,FOUR-FIVE,CERVICAL FIVE-SIX,CERVICAL SIX-SEVEN.;  Surgeon: [name], MD;  Location: [facility];  Service: Neurosurgery;  Laterality: N/A;  ANTERIOR   BACK SURGERY     CATARACT EXTRACTION W/PHACO Right 03/31/2018   Procedure: CATARACT EXTRACTION PHACO AND INTRAOCULAR LENS PLACEMENT ([place]);  Surgeon: [name], MD;  Location: [facility];  Service: Ophthalmology;  Laterality: Right;  [place] 00:39.7 CDE 4.35 Fluid Pack Lot # [id number]   CHOLECYSTECTOMY  1995   COLONOSCOPY WITH PROPOFOL N/A 06/07/2017   Procedure: COLONOSCOPY WITH  PROPOFOL;  Surgeon: [name], MD;  [facility];  Service: Endoscopy;  Laterality: N/A;   ESOPHAGOGASTRODUODENOSCOPY (EGD) WITH PROPOFOL N/A 06/07/2017   Procedure: ESOPHAGOGASTRODUODENOSCOPY (EGD) WITH PROPOFOL;  Surgeon: [name], MD;  Location: [facility];  Service: Endoscopy;  Laterality: N/A;   IR CATHETER TUBE CHANGE  05/10/2020   REDUCTION MAMMAPLASTY Bilateral 1994    There were no vitals filed for this visit.   Subjective Assessment - 02/10/21 1204     Subjective Patient reports exceptional progress to date. Patient reports she needs to work on improving her walking and being able to move her hands (especially L upper limb). Patient reports 40% SANE score at this time. Patient has shower bench with which she can slide into her shower. Patient has assistance for showering, toileting, and dressing/self-care ADLs. She has help with bed mobility at this time. Patient reports she is able to move better and feels her core control with sitting and maintaining sitting/standing postures has improved.    Patient is accompained by: Family member   husband   Pertinent History Patient is a 80 year old female s/p incomplete SCI. She had incomplete SCI following fall - C4 [place] D (DOI: 08/26/19). Primary activity limitations c difficulty walking, transferring, and completing independent functional mobility following incomplete SCI. She has undergone home health therapy for about one year. She and her husband reports she was able to transfer and ambulate with walker in home with PT walking beside her. Pt has practiced  with front-wheel walker at home. 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 Patient reports using steady transfer aid for performing transfers chair to bed and W/C to standard chair. Her husband states that she is able to stand with steady device and can stand with her legs against the bed. Pt has ramp to get in her home for her power wheelchair. Patient  lives in one level home. Pt uses tub bench that she can utilize to slide into shower; her daughter/husband assists with bathing. Pt has Lucianne Lei that can accommodate her power chair at this time. Pt has concrete driveway to get up to her ramp. Pt has small threshold to get into her home - husband utilizes cardboard to get over threshold.    Limitations Walking;Standing;House hold activities;Other (comment)   transferring, bed mobility, bladder management   Patient Stated Goals to be able to walk more independently                OBJECTIVE      Posture Forward head, rounded shoulders posture in chair, mild thoracic kyphosis, no sacral sitting noted today. Patient volitionally assumes upright posture in sitting edge of low mat and standing without cueing.    Trunk AROM WNL Upper trunk AROM; Lower trunk: Anterior and posterior pelvic tilt WNL, able to clear ischial tuberosity from table bilaterally with hip hiking in sitting, no sitting LOB    Gait Mild forward flexed posture onto FWW with decreased gait velocity, good toe clearance with first 80 feet of gait with decreased toe clearance L>R as gait further progresses (minimal toe drag R foot); decreased heel strike L>R and decreased single-limb support time bilaterally   Upper extremity AROM Shoulder Flexion R 108,  L 86 deg  Elbow extension: -13 R, -20 L Elbow flexion WFL bilaterally   Strength R/L 4/4 Shoulder flexion 4/4 Shoulder abduction 4+/4+ Biceps 4+4- Triceps 3+/3+ Hip flexion 5-/5- Hip abduction (seated)  5/5 Hip adduction 4+/4+ Knee extension 4/4+ Knee flexion 4-/4 Ankle Plantarflexion 4/4+ Ankle Dorsiflexion     FUNCTIONAL TASKS Stand Pivot Transfer: Independent for power chair placement relative to low mat. MinA for sit to stand to [place]; Modified-independent for pivot and stand-to-sit.    Rolling: ModA for active roll to R side after dependent management of lower limbs. Difficulty with LUE cross-body adduction and  use of L lower limb and trunk flexors/lateral flexors to perform R roll. MinA with rolling to L side (most difficulty with initiation of L rolling). Independent with rolling sidelying to supine bilaterally. Decreased assist required with elevated head/trunk (on wedge)   Supine to/from sit: IND with moving lower limbs to edge of bed for supine to sit. MaxA for trunk for sidelying to sitting transfer. IND with sitting to supine with exception of MaxA is required to manage lower limbs once patient assumes sidelying. Pt is IND with scooting once in hooklying position, but only scoots 2-3 cm at a time.                 TREATMENT     Therapeutic Activities - patient education, repetitive task practice for improved performance of daily functional activities e.g. transferring  Re-assessment performed (see above and updated Goals below)   Sit to stand and gait to edge of table with modified-independent stand to sit prior to initiation of sitting/lying exercises; ambulation x 6 feet with 90-degree right turn prior to sitting edge of low mat   Sit to stand from raised table, 22" height 1x10  Gait with FWW in clinic; 1 x 120 feet, CGA/supervision throughout gait [cueing for toe clearance]     *next visit*   Sitting edge of bed for partial weightbearing onto bilateral LE, seated march with no UE support; 2x20 with alternating UE/LE   Gait from // bars to power chair across clinic, including retrostep x 4 feet and standing pivot to exit // bars, ambulation x 30 feet to return to chair     *not today* Bed mobility task practice: Supine to sit x 2 with MaxA for managing bilateral lower limbs after transferring sitting to sidelying. Performed bilateral rolling on low mat with ModA for upper and lower limbs as needed to promote independent bed mobility x 1. Utilized wedge to modify demand against gravity and performed bilateral rolling x 3 with patient able to complete R roll without assist, and L roll  with MaxA for arm placement and ModA for trunk to complete roll In // bars: Standing march with UE support on // bars; 2x12 alternating Staggered stance weight shift; x20 bilat LE Standing heel raise/toe raise; x20 alternating Stand pivot transfer to/from wheelchair with front-wheeled walker with ModA for completion of sit to stand, supervision for pivot, and supervision for stand to sit. IND setup of power wheelchair prior to performance of transfer; x2              Neuromuscular Re-education - for increased motor recruitment of major muscle groups with motor impairment, nervous system priming and exercises to promote trunk stability     [*next visit/not performed*] Sitting on purple disc, catching and throwing with bilateral UE with small purple/pink ball (smaller size for increased demand on reaching and grasp accuracy), multi-directional reaching and bilateral grasping; x 4 min  In // bars: Unilateral upper extremity reaching and grasping in standing position with minimal UE support, with bilateral fine motor task, twisting bolt onto nut (bolt c LUE, nut c RUE); x 5 min   Rolling Theraputty (yellow Theraputty) in standing to facilitate improved trunk control and lower limb support without UE assist and digit/MCP extension; x 2 minutes Standing toe tapping onto Airex pad to promote active dorsiflexion during LE swing; x20 bilateral LE Standing step-over single line on floor with alternating LE; 2x10 alternating         *not today* Standing march with R upper extremity support only on single parallel bar, 2x10 alternating [full clearance of RLE, unweighting of LLE with inconsistent clearance from floor] Unilateral upper extremity reaching and grasping in standing position with minimal UE support (touching on rail prn if losing balance); fine motor grasping and trunk rotation to place items into container on opposite side of body (small clothes pins, small cylinder and cubes of various  sizes, nuts, spheres - from "HAND PT" packet); x 2 min  Seated march on purple disc with alternating UE/LE; x25 alternating Sitting on purple disc, catching balloon and balloon tapping back to therapist - to facilitate active cross body adduction and trunk rotation as needed for rolling and reaching/grasping tasks (including low and high punches reaching today); x 2 minute catching, x 2 minutes balloon tapping              ASSESSMENT Patient has met 50% of short and long-term goals. She has made substantial progress with ability to clear feet bilaterally during swing phase of gait, though quality of gait is reduced (decreased toe clearance and intermittent drag) following fatigue. She has markedly progress with ability to perform fine motor tasks  and prehensile tasks in clinic with L upper limb with increased demand on fine pulp to pulp grasp and motor control required. She is independent with nearly all re-positioning in chair and managing components of her power chair (with exception of managing drainage bag). She has met FOTO score for her long-term gal and has made partial progress toward obstacle negotiation goal (long term goal # 4) with pt clearing small obstacle on floor (blue line on floor with negligible height, 2-inch depth). Patient has remaining deficits in increased tone of wrist/digit and elbow flexors (L>RUE), decreased L>R shoulder AROM, impaired trunk control, decreased LE strength and motor recruitment, gait deficits/deviations (most notably difficulty with L>R toe clearance during gait that is more apparent with fatigue), full weight acceptance onto bilateral LE, and impaired postural stability in standing. Patient will benefit from continued skilled therapeutic intervention to address the above deficits as needed for improved function and QoL.        PT Short Term Goals - 02/10/21 1323       PT SHORT TERM GOAL #1   Title Patient will perform stand-pivot transfer from chair to  bed with supervision level of assist and ModI setup of power chair as needed for transferring at home    Baseline 11/26/20: Max assist for setup and mod assist to perform sit to stand, unsteady pivot prior to performing stand to sit.    12/23/20: Patient is able to setup power chair for transfer ModI. Mod to Max assist for sit to stand. CGA level of assist for standing pivot and stand to sit on low mat.   01/15/21: Setup of power chair without assist; modified independent pivot and stand-to-sit; ModA for initial set to stand prior to pivot.  02/10/21: ModI setup of power chair, [place] for initiation of sit to stand, modified-independent stand pivot transfer    Time 4    Period Weeks    Status Partially Met    Target Date 03/03/21      PT SHORT TERM GOAL #2   Title Patient will perform standing toe tap to 3-inch surface (Airex pad) with bilateral upper extremity support indicative of improved ability to clear feet from floor during stepping and improved weight shift to each LE with AD use    Baseline 11/26/20: Limited toe clearance during bilateral LE swing phase with front-wheeled walker.   12/23/20: performed at previous follow-up with bilateral UE support.    Time 3    Period Weeks    Status Achieved    Target Date 12/17/20               PT Long Term Goals - 02/10/21 1325       PT LONG TERM GOAL #1   Title Patient will demonstrate improved function as evidenced by a score of 35 on FOTO measure for full participation in activities at home and in the community.    Baseline 11/26/20: FOTO 12.   12/23/20: FOTO 24.    01/15/21: FOTO 23.   02/10/21: FOTO 38    Time 16    Period Weeks    Status Achieved    Target Date 03/18/21      PT LONG TERM GOAL #2   Title Patient will ambulate for 150 feet without LOB, with sufficient toe clearance to prevent loss of gait stability, and c proper AD placement with least-restrictive assistive device with supervision level of assist    Baseline 11/26/20: Gait x  40 feet with CGA.   12/23/20:  CGA with dec toe clearance L>RLE.   01/15/21: Ambulated 160 feet without LOB, proper AD placement; good toe clearance with first 80 feet, but decrased L>R toe clearanc with fatigue.   02/10/21: Performed with FWW with good toe clearance with first 80 feet, decreasing L toe clearance with succesive steps.    Time 12    Period Weeks    Status Partially Met    Target Date 03/03/21      PT LONG TERM GOAL #3   Title Patient will perform independent re-positioning in chair with use of closed-chain triceps on armrests as needed for independent wheelchair mobility with no verbal cueing or tactile cueing required    Baseline 11/26/20: Dependent re-positioning in wheelchair.   12/23/20: Performed with feet on fold-out platform of her power wheelchair IND.    Time 8    Period Weeks    Status Achieved    Target Date 12/23/20      PT LONG TERM GOAL #4   Title Patient will perform simulated step-over task for clearing doorway threshold with use of front-wheeled walker as needed for entering/exiting home with supervision level of assistance and no LOB    Baseline 11/26/20: Pt currently uses power wheelchair to negotiate threshold into home.   12/23/20: Deferred.  01/15/21: Deferred.   02/10/21: Performed last week with step-over single line on floor with negligible height.    Time 16    Period Weeks    Status Partially Met    Target Date 03/18/21                   Plan - 02/10/21 1657     Clinical Impression Statement Patient has met 50% of short and long-term goals. She has made substantial progress with ability to clear feet bilaterally during swing phase of gait, though quality of gait is reduced (decreased toe clearance and intermittent drag) following fatigue. She has markedly progress with ability to perform fine motor tasks and prehensile tasks in clinic with L upper limb with increased demand on fine pulp to pulp grasp and motor control required. She is independent  with nearly all re-positioning in chair and managing components of her power chair (with exception of managing drainage bag). She has met FOTO score for her long-term gal and has made partial progress toward obstacle negotiation goal (long term goal # 4) with pt clearing small obstacle on floor (blue line on floor with negligible height, 2-inch depth). Patient has remaining deficits in increased tone of wrist/digit and elbow flexors (L>RUE), decreased L>R shoulder AROM, impaired trunk control, decreased LE strength and motor recruitment, gait deficits/deviations (most notably difficulty with L>R toe clearance during gait that is more apparent with fatigue), full weight acceptance onto bilateral LE, and impaired postural stability in standing. Patient will benefit from continued skilled therapeutic intervention to address the above deficits as needed for improved function and QoL.    Personal Factors and Comorbidities Age;Comorbidity 3+;Time since onset of injury/illness/exacerbation    Comorbidities GERD, pre-diabetes, obesity, gout, s/p C3-7 ACDF    Examination-Activity Limitations Bathing;Continence;Reach Overhead;Stairs;Bed Mobility;Dressing;Self Feeding;Stand;Hygiene/Grooming;Toileting;Transfers;Locomotion Level    Examination-Participation Restrictions Community Activity;Yard Work;Laundry;Cleaning;Shop    Stability/Clinical Decision Making Evolving/Moderate complexity    Rehab Potential Fair    PT Frequency Other (comment)   2-3x/week   PT Duration Other (comment)   16 weeks   PT Treatment/Interventions Electrical Stimulation;Gait training;Stair training;Functional mobility training;Therapeutic activities;Therapeutic exercise;Balance training;Neuromuscular re-education;Wheelchair mobility training;Patient/family education    PT Next Visit Plan Gait re-training, task  practice for transferring, LE strengthening, UE AROM and strengthening to assist with functional mobility and transfers. Weight-shifting  and pre-gait activities with decreasing UE support.    PT Home Exercise Plan Pt continuing with home health exercises. Pt continuing with task practice for rolling, scooting, bridging in bed; walking with CGA with FWW; reaching and grasping with LUE. 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, initial encounter [facility])     Problem List Patient Active Problem List   Diagnosis Date Noted   Generalized anxiety disorder 01/24/2021   Suspected deep tissue injury 10/25/2020   Wheelchair dependence 11/27/2019   Spasticity 11/27/2019   Hyperkalemia    Steroid-induced hyperglycemia    Prediabetes    Neurogenic bowel    Neurogenic bladder    Neuropathic pain    Quadriplegia ([place]) 08/31/2019   S/P cervical spinal fusion 08/28/2019   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 [place], [place], [address] Phone: [phone]   Fax:  [phone]  Name: Meredith Roach MRN: [id number] Date of Birth: June 27, 1940

## 2021-02-12 ENCOUNTER — Encounter: Payer: Medicare PPO | Admitting: Physical Therapy

## 2021-02-13 ENCOUNTER — Encounter: Payer: Self-pay | Admitting: Physical Therapy

## 2021-02-13 ENCOUNTER — Other Ambulatory Visit: Payer: Self-pay

## 2021-02-13 ENCOUNTER — Ambulatory Visit: Payer: Medicare PPO | Attending: Physical Medicine and Rehabilitation | Admitting: Physical Therapy

## 2021-02-13 DIAGNOSIS — S14123A 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, initial encounter: Secondary | ICD-10-CM | POA: Insufficient documentation

## 2021-02-13 DIAGNOSIS — R262 Difficulty in walking, not elsewhere classified: Secondary | ICD-10-CM | POA: Insufficient documentation

## 2021-02-13 DIAGNOSIS — M6281 Muscle weakness (generalized): Secondary | ICD-10-CM | POA: Diagnosis present

## 2021-02-13 NOTE — Therapy (Signed)
[facility] [address] [place], [place], [address] Phone: [phone]   Fax:  [phone]  Physical Therapy Treatment  Patient Details  Name: Meredith Roach MRN: [id number] Date of Birth: 12-12-1940 Referring Provider (PT): [name], MD   Encounter Date: 02/13/2021   PT End of Session - 02/13/21 1258     Visit Number 31    Number of Visits 47    Date for PT Re-Evaluation 03/18/21    Authorization Time Period Cert 5/63/14-97/02/63, last progress note 01/15/21    Progress Note Due on Visit 40    PT Start Time 1300    PT Stop Time 1350    PT Time Calculation (min) 50 min    Equipment Utilized During Treatment Gait belt   Pt uses power wheelchair, clinic's front-wheeled walker utilized for gait   Activity Tolerance Patient tolerated treatment well    Behavior During Therapy WFL for tasks assessed/performed             Past Medical History:  Diagnosis Date   Arthritis    GERD (gastroesophageal reflux disease)    Gout    Neuromuscular disorder ([place])    Pre-diabetes     Past Surgical History:  Procedure Laterality Date   ANTERIOR CERVICAL DECOMPRESSION/DISCECTOMY FUSION 4 LEVELS N/A 08/28/2019   Procedure: ANTERIOR CERVICAL DECOMPRESSION/DISCECTOMY FUSION CERVICALTHREE-FOUR CERVICAL,FOUR-FIVE,CERVICAL FIVE-SIX,CERVICAL SIX-SEVEN.;  Surgeon: [name], MD;  Location: [place];  Service: Neurosurgery;  Laterality: N/A;  ANTERIOR   BACK SURGERY     CATARACT EXTRACTION W/PHACO Right 03/31/2018   Procedure: CATARACT EXTRACTION PHACO AND INTRAOCULAR LENS PLACEMENT ([place]);  Surgeon: [name], MD;  Location: [facility];  Service: Ophthalmology;  Laterality: Right;  [place] 00:39.7 CDE 4.35 Fluid Pack Lot # [id number]   CHOLECYSTECTOMY  1995   COLONOSCOPY WITH PROPOFOL N/A 06/07/2017   Procedure: COLONOSCOPY WITH PROPOFOL;  Surgeon: [name], MD;  Location: [facility] ENDOSCOPY;  Service: Endoscopy;  Laterality: N/A;    ESOPHAGOGASTRODUODENOSCOPY (EGD) WITH PROPOFOL N/A 06/07/2017   Procedure: ESOPHAGOGASTRODUODENOSCOPY (EGD) WITH PROPOFOL;  Surgeon: [name], MD;  Location: [facility] ENDOSCOPY;  Service: Endoscopy;  Laterality: N/A;   IR CATHETER TUBE CHANGE  05/10/2020   REDUCTION MAMMAPLASTY Bilateral 1994    There were no vitals filed for this visit.   Subjective Assessment - 02/13/21 1258     Subjective Patient reports notable hypersensitivity of her L lower limb yesterday that has since improved - it is feeling better today. Patient reports no other major changes at arrival to PT. Patient reports feeling generally well today. Pt is continiung work on established exercises from prior therapy and exercises discussed during this episode of care.    Patient is accompained by: Family member   husband   Pertinent History Patient is a 80 year old female s/p incomplete SCI. She had incomplete SCI following fall - C4 [place] D (DOI: 08/26/19). Primary activity limitations c difficulty walking, transferring, and completing independent functional mobility following incomplete SCI. She has undergone home health therapy for about one year. She and her husband reports she was able to transfer and ambulate with walker in home with PT walking beside her. Pt has practiced with front-wheel walker at home. 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 Patient reports using steady transfer aid for performing transfers chair to bed and W/C to standard chair. Her husband states that she is able to stand with steady device and can stand with her legs against the bed.  Pt has ramp to get in her home for her power wheelchair. Patient lives in one level home. Pt uses tub bench that she can utilize to slide into shower; her daughter/husband assists with bathing. Pt has Lucianne Lei that can accommodate her power chair at this time. Pt has concrete driveway to get up to her ramp. Pt has small threshold to get into her home - husband utilizes  cardboard to get over threshold.    Limitations Walking;Standing;House hold activities;Other (comment)   transferring, bed mobility, bladder management   Patient Stated Goals to be able to walk more independently              TREATMENT     Therapeutic Activities - patient education, repetitive task practice for improved performance of daily functional activities e.g. transferring  Performance of supine to/from sit with pt able to assume partial sidelying position onto L forearm, dependent with lower limb management to attain lying position  Bridging; x25 as needed for scooting and re-positioning pelvis on bed   Sit to stand and gait to edge of table with modified-independent stand to sit prior to initiation of sitting/lying exercises; ambulation x 6 feet with 90-degree left turn prior to sitting edge of low mat   Sit to stand from raised table, 4x5 with successive decrease in table height with each set    After stepping drills/neuro re-ed in // bars: Gait from // bars to power chair across clinic, including retrostep x 4 feet and standing pivot to exit // bars, ambulation x 30 feet to return to chair      *not today* Sitting edge of bed for partial weightbearing onto bilateral LE, seated march with no UE support; 2x20 with alternating UE/LE  Gait with FWW in clinic; CGA/supervision throughout gait [cueing for toe clearance] Bed mobility task practice: Supine to sit x 2 with MaxA for managing bilateral lower limbs after transferring sitting to sidelying. Performed bilateral rolling on low mat with ModA for upper and lower limbs as needed to promote independent bed mobility x 1. Utilized wedge to modify demand against gravity and performed bilateral rolling x 3 with patient able to complete R roll without assist, and L roll with MaxA for arm placement and ModA for trunk to complete roll In // bars: Standing march with UE support on // bars; 2x12 alternating Standing heel raise/toe  raise; x20 alternating        Neuromuscular Re-education - for increased motor recruitment of major muscle groups with motor impairment, nervous system priming and exercises to promote trunk stability    Supine March; 2x10 alternating  Sitting on purple disc, catching and throwing with bilateral UE with small black basketball (smaller size for increased demand on reaching and grasp accuracy), multi-directional reaching and bilateral grasping; x 5 min    In // bars: Unilateral upper extremity reaching and grasping in standing position with minimal UE support, with bilateral fine motor task, twisting bolt onto nut (bolt c LUE, nut c RUE); x 3 min  [significant challenge with managing nut c L hand] Rolling Theraputty (yellow Theraputty) in standing to facilitate improved trunk control and lower limb support without UE assist and digit/MCP extension; x 2 minutes  Standing step-over single line on floor with alternating LE; 2x10 alternating         *not today* Standing toe tapping onto Airex pad to promote active dorsiflexion during LE swing; x20 bilateral LE Standing march with R upper extremity support only on single parallel bar, 2x10  alternating [full clearance of RLE, unweighting of LLE with inconsistent clearance from floor] Unilateral upper extremity reaching and grasping in standing position with minimal UE support (touching on rail prn if losing balance); fine motor grasping and trunk rotation to place items into container on opposite side of body (small clothes pins, small cylinder and cubes of various sizes, nuts, spheres - from "HAND PT" packet); x 2 min  Seated march on purple disc with alternating UE/LE; x25 alternating Sitting on purple disc, catching balloon and balloon tapping back to therapist - to facilitate active cross body adduction and trunk rotation as needed for rolling and reaching/grasping tasks (including low and high punches reaching today); x 2 minute catching, x 2  minutes balloon tapping               ASSESSMENT Patient demonstrates ongoing deficits with lifting L lower limb more than RLE. She is able to perform supine march with active hip flexion up to 80 deg. She has L>R knee flexion stiffness, which significantly impacts ability to manage lower limbs for bed mobility work (e.g. supine to/from sit, supine to hooklying). She is significantly challenged with fine prehensile task (requiring pulp to pulp grip) with L hand today. Patient demonstrates good L MCP and PIP/DIP extension passively, but she has difficulty with active extension of 1st-2nd digits to grasp smaller items today. Pt does demonstrate continued modified independence with stand pivot transferring and stand to sit with use of FWW. Patient has remaining deficits in increased tone of wrist/digit and elbow flexors (L>RUE), decreased L>R shoulder AROM, impaired trunk control, decreased LE strength and motor recruitment, gait deficits/deviations (most notably difficulty with L>R toe clearance during gait that is more apparent with fatigue), full weight acceptance onto bilateral LE, and impaired postural stability in standing. Patient will benefit from continued skilled therapeutic intervention to address the above deficits as needed for improved function and QoL.         PT Short Term Goals - 02/10/21 1323       PT SHORT TERM GOAL #1   Title Patient will perform stand-pivot transfer from chair to bed with supervision level of assist and ModI setup of power chair as needed for transferring at home    Baseline 11/26/20: Max assist for setup and mod assist to perform sit to stand, unsteady pivot prior to performing stand to sit.    12/23/20: Patient is able to setup power chair for transfer ModI. Mod to Max assist for sit to stand. CGA level of assist for standing pivot and stand to sit on low mat.   01/15/21: Setup of power chair without assist; modified independent pivot and stand-to-sit; ModA for  initial set to stand prior to pivot.  02/10/21: ModI setup of power chair, [place] for initiation of sit to stand, modified-independent stand pivot transfer    Time 4    Period Weeks    Status Partially Met    Target Date 03/03/21      PT SHORT TERM GOAL #2   Title Patient will perform standing toe tap to 3-inch surface (Airex pad) with bilateral upper extremity support indicative of improved ability to clear feet from floor during stepping and improved weight shift to each LE with AD use    Baseline 11/26/20: Limited toe clearance during bilateral LE swing phase with front-wheeled walker.   12/23/20: performed at previous follow-up with bilateral UE support.    Time 3    Period Weeks    Status Achieved  Target Date 12/17/20               PT Long Term Goals - 02/10/21 1325       PT LONG TERM GOAL #1   Title Patient will demonstrate improved function as evidenced by a score of 35 on FOTO measure for full participation in activities at home and in the community.    Baseline 11/26/20: FOTO 12.   12/23/20: FOTO 24.    01/15/21: FOTO 23.   02/10/21: FOTO 38    Time 16    Period Weeks    Status Achieved    Target Date 03/18/21      PT LONG TERM GOAL #2   Title Patient will ambulate for 150 feet without LOB, with sufficient toe clearance to prevent loss of gait stability, and c proper AD placement with least-restrictive assistive device with supervision level of assist    Baseline 11/26/20: Gait x 40 feet with CGA.   12/23/20: CGA with dec toe clearance L>RLE.   01/15/21: Ambulated 160 feet without LOB, proper AD placement; good toe clearance with first 80 feet, but decrased L>R toe clearanc with fatigue.   02/10/21: Performed with FWW with good toe clearance with first 80 feet, decreasing L toe clearance with succesive steps.    Time 12    Period Weeks    Status Partially Met    Target Date 03/03/21      PT LONG TERM GOAL #3   Title Patient will perform independent re-positioning in chair  with use of closed-chain triceps on armrests as needed for independent wheelchair mobility with no verbal cueing or tactile cueing required    Baseline 11/26/20: Dependent re-positioning in wheelchair.   12/23/20: Performed with feet on fold-out platform of her power wheelchair IND.    Time 8    Period Weeks    Status Achieved    Target Date 12/23/20      PT LONG TERM GOAL #4   Title Patient will perform simulated step-over task for clearing doorway threshold with use of front-wheeled walker as needed for entering/exiting home with supervision level of assistance and no LOB    Baseline 11/26/20: Pt currently uses power wheelchair to negotiate threshold into home.   12/23/20: Deferred.  01/15/21: Deferred.   02/10/21: Performed last week with step-over single line on floor with negligible height.    Time 16    Period Weeks    Status Partially Met    Target Date 03/18/21                   Plan - 02/13/21 1423     Clinical Impression Statement Patient demonstrates ongoing deficits with lifting L lower limb more than RLE. She is able to perform supine march with active hip flexion up to 80 deg. She has L>R knee flexion stiffness, which significantly impacts ability to manage lower limbs for bed mobility work (e.g. supine to/from sit, supine to hooklying). She is significantly challenged with fine prehensile task (requiring pulp to pulp grip) with L hand today. Patient demonstrates good L MCP and PIP/DIP extension passively, but she has difficulty with active extension of 1st-2nd digits to grasp smaller items today. Pt does demonstrate continued modified independence with stand pivot transferring and stand to sit with use of FWW. Patient has remaining deficits in increased tone of wrist/digit and elbow flexors (L>RUE), decreased L>R shoulder AROM, impaired trunk control, decreased LE strength and motor recruitment, gait deficits/deviations (most notably difficulty with L>R  toe clearance during gait  that is more apparent with fatigue), full weight acceptance onto bilateral LE, and impaired postural stability in standing. Patient will benefit from continued skilled therapeutic intervention to address the above deficits as needed for improved function and QoL.    Personal Factors and Comorbidities Age;Comorbidity 3+;Time since onset of injury/illness/exacerbation    Comorbidities GERD, pre-diabetes, obesity, gout, s/p C3-7 ACDF    Examination-Activity Limitations Bathing;Continence;Reach Overhead;Stairs;Bed Mobility;Dressing;Self Feeding;Stand;Hygiene/Grooming;Toileting;Transfers;Locomotion Level    Examination-Participation Restrictions Community Activity;Yard Work;Laundry;Cleaning;Shop    Stability/Clinical Decision Making Evolving/Moderate complexity    Rehab Potential Fair    PT Frequency Other (comment)   2-3x/week   PT Duration Other (comment)   16 weeks   PT Treatment/Interventions Electrical Stimulation;Gait training;Stair training;Functional mobility training;Therapeutic activities;Therapeutic exercise;Balance training;Neuromuscular re-education;Wheelchair mobility training;Patient/family education    PT Next Visit Plan Gait re-training, task practice for transferring, LE strengthening, UE AROM and strengthening to assist with functional mobility and transfers. Weight-shifting and pre-gait activities with decreasing UE support.    PT Home Exercise Plan Pt continuing with home health exercises. Pt continuing with task practice for rolling, scooting, bridging in bed; walking with CGA with FWW; reaching and grasping with LUE. 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, initial encounter [facility])     Problem List Patient Active Problem List   Diagnosis Date Noted   Generalized anxiety disorder 01/24/2021   Suspected deep tissue injury 10/25/2020   Wheelchair dependence 11/27/2019   Spasticity 11/27/2019   Hyperkalemia    Steroid-induced hyperglycemia    Prediabetes    Neurogenic bowel    Neurogenic bladder    Neuropathic pain    Quadriplegia ([place]) 08/31/2019   S/P cervical spinal fusion 08/28/2019   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 [place], [place], [address] Phone: [phone]   Fax:  [phone]  Name: Meredith Roach MRN: [id number] Date of Birth: 1941/01/06

## 2021-02-17 ENCOUNTER — Encounter: Payer: Self-pay | Admitting: Physical Therapy

## 2021-02-17 ENCOUNTER — Other Ambulatory Visit: Payer: Self-pay

## 2021-02-17 ENCOUNTER — Ambulatory Visit: Payer: Medicare PPO | Admitting: Physical Therapy

## 2021-02-17 DIAGNOSIS — M6281 Muscle weakness (generalized): Secondary | ICD-10-CM

## 2021-02-17 DIAGNOSIS — R262 Difficulty in walking, not elsewhere classified: Secondary | ICD-10-CM

## 2021-02-17 DIAGNOSIS — S14123A 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, initial encounter: Secondary | ICD-10-CM

## 2021-02-17 NOTE — Therapy (Signed)
[facility] [address] [place], [place], [address] Phone: [phone]   Fax:  [phone]  Physical Therapy Treatment  Patient Details  Name: Meredith Roach MRN: [id number] Date of Birth: 10/17/1940 Referring Provider (PT): [name], MD   Encounter Date: 02/17/2021   PT End of Session - 02/17/21 1343     Visit Number 32    Number of Visits 71    Date for PT Re-Evaluation 03/18/21    Authorization Time Period Cert 07/21/71-53/29/92, last progress note 01/15/21    Progress Note Due on Visit 40    PT Start Time 1330    PT Stop Time 1420    PT Time Calculation (min) 50 min    Equipment Utilized During Treatment Gait belt   Pt uses power wheelchair, clinic's front-wheeled walker utilized for gait   Activity Tolerance Patient tolerated treatment well    Behavior During Therapy WFL for tasks assessed/performed             Past Medical History:  Diagnosis Date   Arthritis    GERD (gastroesophageal reflux disease)    Gout    Neuromuscular disorder ([place])    Pre-diabetes     Past Surgical History:  Procedure Laterality Date   ANTERIOR CERVICAL DECOMPRESSION/DISCECTOMY FUSION 4 LEVELS N/A 08/28/2019   Procedure: ANTERIOR CERVICAL DECOMPRESSION/DISCECTOMY FUSION CERVICALTHREE-FOUR CERVICAL,FOUR-FIVE,CERVICAL FIVE-SIX,CERVICAL SIX-SEVEN.;  Surgeon: [name], MD;  Location: [facility];  Service: Neurosurgery;  Laterality: N/A;  ANTERIOR   BACK SURGERY     CATARACT EXTRACTION W/PHACO Right 03/31/2018   Procedure: CATARACT EXTRACTION PHACO AND INTRAOCULAR LENS PLACEMENT ([place]);  Surgeon: [name], MD;  Location: [facility];  Service: Ophthalmology;  Laterality: Right;  [place] 00:39.7 CDE 4.35 Fluid Pack Lot # [id number]   CHOLECYSTECTOMY  1995   COLONOSCOPY WITH PROPOFOL N/A 06/07/2017   Procedure: COLONOSCOPY WITH PROPOFOL;  Surgeon: [name], MD;  Location: [facility] ENDOSCOPY;  Service: Endoscopy;  Laterality: N/A;    ESOPHAGOGASTRODUODENOSCOPY (EGD) WITH PROPOFOL N/A 06/07/2017   Procedure: ESOPHAGOGASTRODUODENOSCOPY (EGD) WITH PROPOFOL;  Surgeon: [name], MD;  Location: [facility] ENDOSCOPY;  Service: Endoscopy;  Laterality: N/A;   IR CATHETER TUBE CHANGE  05/10/2020   REDUCTION MAMMAPLASTY Bilateral 1994    There were no vitals filed for this visit.   Subjective Assessment - 02/17/21 1332     Subjective Patient is working with nuts/bolts at home for fine motor control of her hands. Patient reports notable stiffness in her L lower limb at arrival to PT. She states it feels numb on her L side. Pt denies other new updates at arrival.    Patient is accompained by: Family member   husband   Pertinent History Patient is a 80 year old female s/p incomplete SCI. She had incomplete SCI following fall - C4 [place] D (DOI: 08/26/19). Primary activity limitations c difficulty walking, transferring, and completing independent functional mobility following incomplete SCI. She has undergone home health therapy for about one year. She and her husband reports she was able to transfer and ambulate with walker in home with PT walking beside her. Pt has practiced with front-wheel walker at home. 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 Patient reports using steady transfer aid for performing transfers chair to bed and W/C to standard chair. Her husband states that she is able to stand with steady device and can stand with her legs against the bed. Pt has ramp to get in her home for her power  wheelchair. Patient lives in one level home. Pt uses tub bench that she can utilize to slide into shower; her daughter/husband assists with bathing. Pt has Lucianne Lei that can accommodate her power chair at this time. Pt has concrete driveway to get up to her ramp. Pt has small threshold to get into her home - husband utilizes cardboard to get over threshold.    Limitations Walking;Standing;House hold activities;Other (comment)    transferring, bed mobility, bladder management   Patient Stated Goals to be able to walk more independently               TREATMENT     Therapeutic Activities - patient education, repetitive task practice for improved performance of daily functional activities e.g. transferring, bed mobility    Sit to stand and gait to edge of table with modified-independent stand to sit prior to initiation of sitting/lying exercises; ambulation x 6 feet with 90-degree left turn prior to sitting edge of low mat  Performance of supine to/from sit with pt able to assume partial sidelying position onto L forearm, dependent with lower limb management to attain lying position Performance of independent scooting on table to move from edge of mat toward middle; pt requires MaxA for bilateral knee flexion to assume hooklying position    Bridging; x25 as needed for scooting and re-positioning pelvis on bed   Sit to stand from raised table, 2x10 with table height 21-inches     After stepping drills/neuro re-ed in // bars: Gait from // bars to power chair across clinic, including retrostep x 4 feet and standing pivot to exit // bars, ambulation x 30 feet to return to chair       *not today* Sitting edge of bed for partial weightbearing onto bilateral LE, seated march with no UE support; 2x20 with alternating UE/LE  Gait with FWW in clinic; CGA/supervision throughout gait [cueing for toe clearance] Bed mobility task practice: Supine to sit x 2 with MaxA for managing bilateral lower limbs after transferring sitting to sidelying. Performed bilateral rolling on low mat with ModA for upper and lower limbs as needed to promote independent bed mobility x 1. Utilized wedge to modify demand against gravity and performed bilateral rolling x 3 with patient able to complete R roll without assist, and L roll with MaxA for arm placement and ModA for trunk to complete roll In // bars: Standing march with UE support on //  bars; 2x12 alternating Standing heel raise/toe raise; x20 alternating         Neuromuscular Re-education - for increased motor recruitment of major muscle groups with motor impairment, nervous system priming and exercises to promote trunk stability    Supine marching, tactile cue for pt attempting to get 90 deg hip flexion [limited hip flexion AROM on L]; 2x10 alternating     In // bars: Unilateral upper extremity reaching and grasping in standing position with minimal UE support, with bilateral fine motor task, twisting bolt onto nut (bolt c LUE, nut c RUE); x 1 min  [significant challenge with managing bolt c L hand today]  Standing toe tapping onto Airex pad to promote active dorsiflexion during LE swing; x20 bilateral LE  Standing step-over single line on floor with alternating LE; 2x10 alternating      *next visit* Rolling Theraputty (yellow Theraputty) in standing to facilitate improved trunk control and lower limb support without UE assist and digit/MCP extension; x 2 minutes      *not today* Sitting on purple disc,  catching and throwing with bilateral UE with small black basketball (smaller size for increased demand on reaching and grasp accuracy), multi-directional reaching and bilateral grasping; x 5 min  Standing march with R upper extremity support only on single parallel bar, 2x10 alternating [full clearance of RLE, unweighting of LLE with inconsistent clearance from floor] Unilateral upper extremity reaching and grasping in standing position with minimal UE support (touching on rail prn if losing balance); fine motor grasping and trunk rotation to place items into container on opposite side of body (small clothes pins, small cylinder and cubes of various sizes, nuts, spheres - from "HAND PT" packet); x 2 min  Seated march on purple disc with alternating UE/LE; x25 alternating Sitting on purple disc, catching balloon and balloon tapping back to therapist - to facilitate  active cross body adduction and trunk rotation as needed for rolling and reaching/grasping tasks (including low and high punches reaching today); x 2 minute catching, x 2 minutes balloon tapping               ASSESSMENT Patient is able to perform independent sit to stand with minimal upper limb assist from table height lower than that of her power chair. She exhibits ongoing challenges with bed mobility, but she is able to complete partial L rolling with minimal assist following MaxA to initiate first 25% of left roll from supine to quarter-turn L. She is significantly challenged with pulp-to-pulp grasping with L hand today with pt having difficulty maintaining grip on bolts with L hand. She is able to lift metal nuts and move items cross-body with pulp-to-pulp grip. Patient has remaining deficits in increased tone of wrist/digit and elbow flexors (L>RUE), decreased L>R shoulder AROM, impaired trunk control, decreased LE strength and motor recruitment, gait deficits/deviations (most notably difficulty with L>R toe clearance during gait that is more apparent with fatigue), full weight acceptance onto bilateral LE, and impaired postural stability in standing. Patient will benefit from continued skilled therapeutic intervention to address the above deficits as needed for improved function and QoL.        PT Short Term Goals - 02/10/21 1323       PT SHORT TERM GOAL #1   Title Patient will perform stand-pivot transfer from chair to bed with supervision level of assist and ModI setup of power chair as needed for transferring at home    Baseline 11/26/20: Max assist for setup and mod assist to perform sit to stand, unsteady pivot prior to performing stand to sit.    12/23/20: Patient is able to setup power chair for transfer ModI. Mod to Max assist for sit to stand. CGA level of assist for standing pivot and stand to sit on low mat.   01/15/21: Setup of power chair without assist; modified independent pivot  and stand-to-sit; ModA for initial set to stand prior to pivot.  02/10/21: ModI setup of power chair, [place] for initiation of sit to stand, modified-independent stand pivot transfer    Time 4    Period Weeks    Status Partially Met    Target Date 03/03/21      PT SHORT TERM GOAL #2   Title Patient will perform standing toe tap to 3-inch surface (Airex pad) with bilateral upper extremity support indicative of improved ability to clear feet from floor during stepping and improved weight shift to each LE with AD use    Baseline 11/26/20: Limited toe clearance during bilateral LE swing phase with front-wheeled walker.   12/23/20: performed  at previous follow-up with bilateral UE support.    Time 3    Period Weeks    Status Achieved    Target Date 12/17/20               PT Long Term Goals - 02/10/21 1325       PT LONG TERM GOAL #1   Title Patient will demonstrate improved function as evidenced by a score of 35 on FOTO measure for full participation in activities at home and in the community.    Baseline 11/26/20: FOTO 12.   12/23/20: FOTO 24.    01/15/21: FOTO 23.   02/10/21: FOTO 38    Time 16    Period Weeks    Status Achieved    Target Date 03/18/21      PT LONG TERM GOAL #2   Title Patient will ambulate for 150 feet without LOB, with sufficient toe clearance to prevent loss of gait stability, and c proper AD placement with least-restrictive assistive device with supervision level of assist    Baseline 11/26/20: Gait x 40 feet with CGA.   12/23/20: CGA with dec toe clearance L>RLE.   01/15/21: Ambulated 160 feet without LOB, proper AD placement; good toe clearance with first 80 feet, but decrased L>R toe clearanc with fatigue.   02/10/21: Performed with FWW with good toe clearance with first 80 feet, decreasing L toe clearance with succesive steps.    Time 12    Period Weeks    Status Partially Met    Target Date 03/03/21      PT LONG TERM GOAL #3   Title Patient will perform  independent re-positioning in chair with use of closed-chain triceps on armrests as needed for independent wheelchair mobility with no verbal cueing or tactile cueing required    Baseline 11/26/20: Dependent re-positioning in wheelchair.   12/23/20: Performed with feet on fold-out platform of her power wheelchair IND.    Time 8    Period Weeks    Status Achieved    Target Date 12/23/20      PT LONG TERM GOAL #4   Title Patient will perform simulated step-over task for clearing doorway threshold with use of front-wheeled walker as needed for entering/exiting home with supervision level of assistance and no LOB    Baseline 11/26/20: Pt currently uses power wheelchair to negotiate threshold into home.   12/23/20: Deferred.  01/15/21: Deferred.   02/10/21: Performed last week with step-over single line on floor with negligible height.    Time 16    Period Weeks    Status Partially Met    Target Date 03/18/21                   Plan - 02/18/21 0758     Clinical Impression Statement Patient is able to perform independent sit to stand with minimal upper limb assist from table height lower than that of her power chair. She exhibits ongoing challenges with bed mobility, but she is able to complete partial L rolling with minimal assist following MaxA to initiate first 25% of left roll from supine to quarter-turn L. She is significantly challenged with pulp-to-pulp grasping with L hand today with pt having difficulty maintaining grip on bolts with L hand. She is able to lift metal nuts and move items cross-body with pulp-to-pulp grip. Patient has remaining deficits in increased tone of wrist/digit and elbow flexors (L>RUE), decreased L>R shoulder AROM, impaired trunk control, decreased LE strength and motor recruitment,  gait deficits/deviations (most notably difficulty with L>R toe clearance during gait that is more apparent with fatigue), full weight acceptance onto bilateral LE, and impaired postural  stability in standing. Patient will benefit from continued skilled therapeutic intervention to address the above deficits as needed for improved function and QoL.    Personal Factors and Comorbidities Age;Comorbidity 3+;Time since onset of injury/illness/exacerbation    Comorbidities GERD, pre-diabetes, obesity, gout, s/p C3-7 ACDF    Examination-Activity Limitations Bathing;Continence;Reach Overhead;Stairs;Bed Mobility;Dressing;Self Feeding;Stand;Hygiene/Grooming;Toileting;Transfers;Locomotion Level    Examination-Participation Restrictions Community Activity;Yard Work;Laundry;Cleaning;Shop    Stability/Clinical Decision Making Evolving/Moderate complexity    Rehab Potential Fair    PT Frequency Other (comment)   2-3x/week   PT Duration Other (comment)   16 weeks   PT Treatment/Interventions Electrical Stimulation;Gait training;Stair training;Functional mobility training;Therapeutic activities;Therapeutic exercise;Balance training;Neuromuscular re-education;Wheelchair mobility training;Patient/family education    PT Next Visit Plan Gait re-training, task practice for transferring, LE strengthening, UE AROM and strengthening to assist with functional mobility and transfers. Weight-shifting and pre-gait activities with decreasing UE support.    PT Home Exercise Plan Pt continuing with home health exercises. Pt continuing with task practice for rolling, scooting, bridging in bed; walking with CGA with FWW; reaching and grasping with LUE. 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, initial encounter [facility])     Problem List Patient Active Problem List   Diagnosis Date Noted   Generalized anxiety disorder 01/24/2021   Suspected deep tissue injury 10/25/2020   Wheelchair dependence 11/27/2019   Spasticity 11/27/2019   Hyperkalemia    Steroid-induced hyperglycemia    Prediabetes    Neurogenic bowel    Neurogenic bladder    Neuropathic pain    Quadriplegia ([place]) 08/31/2019   S/P cervical spinal fusion 08/28/2019   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 [place], [place], [address] Phone: [phone]   Fax:  [phone]  Name: NEFERTITI MOHAMAD MRN: [id number] Date of Birth: 02/23/1941

## 2021-02-18 ENCOUNTER — Ambulatory Visit: Payer: Medicare PPO | Admitting: Physical Therapy

## 2021-02-18 DIAGNOSIS — R262 Difficulty in walking, not elsewhere classified: Secondary | ICD-10-CM

## 2021-02-18 DIAGNOSIS — S14123A 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, initial encounter: Secondary | ICD-10-CM

## 2021-02-18 DIAGNOSIS — M6281 Muscle weakness (generalized): Secondary | ICD-10-CM

## 2021-02-18 NOTE — Therapy (Signed)
[facility] [address] [place], [place], [address] Phone: [phone]   Fax:  [phone]  Physical Therapy Treatment  Patient Details  Name: Meredith Roach MRN: [id number] Date of Birth: November 21, 1940 Referring Provider (PT): [name], MD   Encounter Date: 02/18/2021   PT End of Session - 02/19/21 1307     Visit Number 33    Number of Visits 15    Date for PT Re-Evaluation 03/18/21    Authorization Time Period Cert 2/58/52-77/82/42, last progress note 01/15/21    Progress Note Due on Visit 90    PT Start Time 1128    PT Stop Time 1216    PT Time Calculation (min) 48 min    Equipment Utilized During Treatment Gait belt   Pt uses power wheelchair, clinic's front-wheeled walker utilized for gait   Activity Tolerance Patient tolerated treatment well    Behavior During Therapy WFL for tasks assessed/performed             Past Medical History:  Diagnosis Date   Arthritis    GERD (gastroesophageal reflux disease)    Gout    Neuromuscular disorder ([place])    Pre-diabetes     Past Surgical History:  Procedure Laterality Date   ANTERIOR CERVICAL DECOMPRESSION/DISCECTOMY FUSION 4 LEVELS N/A 08/28/2019   Procedure: ANTERIOR CERVICAL DECOMPRESSION/DISCECTOMY FUSION CERVICALTHREE-FOUR CERVICAL,FOUR-FIVE,CERVICAL FIVE-SIX,CERVICAL SIX-SEVEN.;  Surgeon: [name], MD;  Location: [facility];  Service: Neurosurgery;  Laterality: N/A;  ANTERIOR   BACK SURGERY     CATARACT EXTRACTION W/PHACO Right 03/31/2018   Procedure: CATARACT EXTRACTION PHACO AND INTRAOCULAR LENS PLACEMENT ([place]);  Surgeon: [name], MD;  Location: [facility];  Service: Ophthalmology;  Laterality: Right;  [place] 00:39.7 CDE 4.35 Fluid Pack Lot # [id number]   CHOLECYSTECTOMY  1995   COLONOSCOPY WITH PROPOFOL N/A 06/07/2017   Procedure: COLONOSCOPY WITH PROPOFOL;  Surgeon: [name], MD;  Location[facility];  Service: Endoscopy;  Laterality: N/A;    ESOPHAGOGASTRODUODENOSCOPY (EGD) WITH PROPOFOL N/A 06/07/2017   Procedure: ESOPHAGOGASTRODUODENOSCOPY (EGD) WITH PROPOFOL;  Surgeon: [name], MD;  Location: [facility] ENDOSCOPY;  Service: Endoscopy;  Laterality: N/A;   IR CATHETER TUBE CHANGE  05/10/2020   REDUCTION MAMMAPLASTY Bilateral 1994    There were no vitals filed for this visit.   Subjective Assessment - 02/19/21 1305     Subjective Patient reports tolerating yesterday's session well and states she feels less stiff/tight today. Pt is working on daily walking with FWW at home. She has been able to perform sit to stand from her bed without UE support. She reports intermittent difficulty with prehensile and fine motor tasks with L upper limb given altered sensation and difficulty with extending MCP/PIP joints. Pt is compliant with HEP.    Patient is accompained by: Family member   husband   Pertinent History Patient is a 80 year old female s/p incomplete SCI. She had incomplete SCI following fall - C4 [place] D (DOI: 08/26/19). Primary activity limitations c difficulty walking, transferring, and completing independent functional mobility following incomplete SCI. She has undergone home health therapy for about one year. She and her husband reports she was able to transfer and ambulate with walker in home with PT walking beside her. Pt has practiced with front-wheel walker at home. 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 Patient reports using steady transfer aid for performing transfers chair to bed and W/C to standard chair. Her husband states that she is able to stand  with steady device and can stand with her legs against the bed. Pt has ramp to get in her home for her power wheelchair. Patient lives in one level home. Pt uses tub bench that she can utilize to slide into shower; her daughter/husband assists with bathing. Pt has Lucianne Lei that can accommodate her power chair at this time. Pt has concrete driveway to get up to her ramp. Pt  has small threshold to get into her home - husband utilizes cardboard to get over threshold.    Limitations Walking;Standing;House hold activities;Other (comment)   transferring, bed mobility, bladder management   Patient Stated Goals to be able to walk more independently                 TREATMENT     Therapeutic Activities - patient education, repetitive task practice for improved performance of daily functional activities e.g. transferring, bed mobility    Sit to stand and gait to edge of table with modified-independent stand to sit prior to initiation of sitting/lying exercises; ambulation x 6 feet with 90-degree left turn prior to sitting edge of low mat   Performance of supine to/from sit with pt able to assume partial sidelying position onto L forearm, dependent with lower limb management to attain lying position Performance of independent scooting on table to move from edge of mat toward middle   Bridging; x25 as needed for scooting and re-positioning pelvis on bed    Sit to stand from raised table, 2x10 with table height, 21-inches for height of table     After stepping drills/neuro re-ed in // bars: Gait from // bars to power chair across clinic, including retrostep x 4 feet and standing pivot to exit // bars, ambulation x 30 feet to return to chair        *not today* Sitting edge of bed for partial weightbearing onto bilateral LE, seated march with no UE support; 2x20 with alternating UE/LE  Gait with FWW in clinic; CGA/supervision throughout gait [cueing for toe clearance] Bed mobility task practice: Supine to sit x 2 with MaxA for managing bilateral lower limbs after transferring sitting to sidelying. Performed bilateral rolling on low mat with ModA for upper and lower limbs as needed to promote independent bed mobility x 1. Utilized wedge to modify demand against gravity and performed bilateral rolling x 3 with patient able to complete R roll without assist, and L roll  with MaxA for arm placement and ModA for trunk to complete roll In // bars: Standing march with UE support on // bars; 2x12 alternating Standing heel raise/toe raise; x20 alternating         Neuromuscular Re-education - for increased motor recruitment of major muscle groups with motor impairment, nervous system priming and exercises to promote trunk stability    Supine marching, tactile cue for pt attempting to get 90 deg hip flexion [limited hip flexion AROM on L]; 2x10 alternating     In // bars: Standing toe tapping onto Airex pad to promote active dorsiflexion during LE swing; x20 bilateral LE  Standing march with R upper extremity support only on single parallel bar, 2x10 alternating [full clearance of RLE, unweighting of LLE with inconsistent clearance from floor]   Standing step-over single line on floor with alternating LE; 2x10 alternating     *next visit* Rolling Theraputty (yellow Theraputty) in standing to facilitate improved trunk control and lower limb support without UE assist and digit/MCP extension; x 2 minutes       *  not today* Unilateral upper extremity reaching and grasping in standing position with minimal UE support, with bilateral fine motor task, twisting bolt onto nut (bolt c LUE, nut c RUE); x 1 min  [significant challenge with managing bolt c L hand today] Sitting on purple disc, catching and throwing with bilateral UE with small black basketball (smaller size for increased demand on reaching and grasp accuracy), multi-directional reaching and bilateral grasping; x 5 min   Unilateral upper extremity reaching and grasping in standing position with minimal UE support (touching on rail prn if losing balance); fine motor grasping and trunk rotation to place items into container on opposite side of body (small clothes pins, small cylinder and cubes of various sizes, nuts, spheres - from "HAND PT" packet); x 2 min  Seated march on purple disc with alternating  UE/LE; x25 alternating Sitting on purple disc, catching balloon and balloon tapping back to therapist - to facilitate active cross body adduction and trunk rotation as needed for rolling and reaching/grasping tasks (including low and high punches reaching today); x 2 minute catching, x 2 minutes balloon tapping         ASSESSMENT Physical therapy focused on bed mobility, hip AROM, standing/stepping activity, and weight-shifting work today. Pt instructed to continue with fine motor work at home including exercising with nuts/bolts and cotton balls. She exhibits improving ability to perform scooting on low mat to re-position pelvis as needed for bed mobility and prevention of pressure wounds. She requires help with lower limb management during supine to sit and sitting to supine transfer. She is able to accept full bodyweight onto L lower limb with unilateral RUE support, but she has insufficient L lower limb strength to fully lift L foot from floor as needed for unipedal stance on R lower limb. Patient has remaining deficits in increased tone of wrist/digit and elbow flexors (L>RUE), decreased L>R shoulder AROM, impaired trunk control, decreased LE strength and motor recruitment, gait deficits/deviations (most notably difficulty with L>R toe clearance during gait that is more apparent with fatigue), full weight acceptance onto bilateral LE, and impaired postural stability in standing. Patient will benefit from continued skilled therapeutic intervention to address the above deficits as needed for improved function and QoL.         PT Short Term Goals - 02/10/21 1323       PT SHORT TERM GOAL #1   Title Patient will perform stand-pivot transfer from chair to bed with supervision level of assist and ModI setup of power chair as needed for transferring at home    Baseline 11/26/20: Max assist for setup and mod assist to perform sit to stand, unsteady pivot prior to performing stand to sit.    12/23/20:  Patient is able to setup power chair for transfer ModI. Mod to Max assist for sit to stand. CGA level of assist for standing pivot and stand to sit on low mat.   01/15/21: Setup of power chair without assist; modified independent pivot and stand-to-sit; ModA for initial set to stand prior to pivot.  02/10/21: ModI setup of power chair, ModA for initiation of sit to stand, modified-independent stand pivot transfer    Time 4    Period Weeks    Status Partially Met    Target Date 03/03/21      PT SHORT TERM GOAL #2   Title Patient will perform standing toe tap to 3-inch surface (Airex pad) with bilateral upper extremity support indicative of improved ability to clear feet from  floor during stepping and improved weight shift to each LE with AD use    Baseline 11/26/20: Limited toe clearance during bilateral LE swing phase with front-wheeled walker.   12/23/20: performed at previous follow-up with bilateral UE support.    Time 3    Period Weeks    Status Achieved    Target Date 12/17/20               PT Long Term Goals - 02/10/21 1325       PT LONG TERM GOAL #1   Title Patient will demonstrate improved function as evidenced by a score of 35 on FOTO measure for full participation in activities at home and in the community.    Baseline 11/26/20: FOTO 12.   12/23/20: FOTO 24.    01/15/21: FOTO 23.   02/10/21: FOTO 38    Time 16    Period Weeks    Status Achieved    Target Date 03/18/21      PT LONG TERM GOAL #2   Title Patient will ambulate for 150 feet without LOB, with sufficient toe clearance to prevent loss of gait stability, and c proper AD placement with least-restrictive assistive device with supervision level of assist    Baseline 11/26/20: Gait x 40 feet with CGA.   12/23/20: CGA with dec toe clearance L>RLE.   01/15/21: Ambulated 160 feet without LOB, proper AD placement; good toe clearance with first 80 feet, but decrased L>R toe clearanc with fatigue.   02/10/21: Performed with FWW with  good toe clearance with first 80 feet, decreasing L toe clearance with succesive steps.    Time 12    Period Weeks    Status Partially Met    Target Date 03/03/21      PT LONG TERM GOAL #3   Title Patient will perform independent re-positioning in chair with use of closed-chain triceps on armrests as needed for independent wheelchair mobility with no verbal cueing or tactile cueing required    Baseline 11/26/20: Dependent re-positioning in wheelchair.   12/23/20: Performed with feet on fold-out platform of her power wheelchair IND.    Time 8    Period Weeks    Status Achieved    Target Date 12/23/20      PT LONG TERM GOAL #4   Title Patient will perform simulated step-over task for clearing doorway threshold with use of front-wheeled walker as needed for entering/exiting home with supervision level of assistance and no LOB    Baseline 11/26/20: Pt currently uses power wheelchair to negotiate threshold into home.   12/23/20: Deferred.  01/15/21: Deferred.   02/10/21: Performed last week with step-over single line on floor with negligible height.    Time 16    Period Weeks    Status Partially Met    Target Date 03/18/21                   Plan - 02/19/21 1314     Clinical Impression Statement Physical therapy focused on bed mobility, hip AROM, standing/stepping activity, and weight-shifting work today. Pt instructed to continue with fine motor work at home including exercising with nuts/bolts and cotton balls. She exhibits improving ability to perform scooting on low mat to re-position pelvis as needed for bed mobility and prevention of pressure wounds. She requires help with lower limb management during supine to sit and sitting to supine transfer. She is able to accept full bodyweight onto L lower limb with unilateral RUE support, but  she has insufficient L lower limb strength to fully lift L foot from floor as needed for unipedal stance on R lower limb. Patient has remaining deficits in  increased tone of wrist/digit and elbow flexors (L>RUE), decreased L>R shoulder AROM, impaired trunk control, decreased LE strength and motor recruitment, gait deficits/deviations (most notably difficulty with L>R toe clearance during gait that is more apparent with fatigue), full weight acceptance onto bilateral LE, and impaired postural stability in standing. Patient will benefit from continued skilled therapeutic intervention to address the above deficits as needed for improved function and QoL.    Personal Factors and Comorbidities Age;Comorbidity 3+;Time since onset of injury/illness/exacerbation    Comorbidities GERD, pre-diabetes, obesity, gout, s/p C3-7 ACDF    Examination-Activity Limitations Bathing;Continence;Reach Overhead;Stairs;Bed Mobility;Dressing;Self Feeding;Stand;Hygiene/Grooming;Toileting;Transfers;Locomotion Level    Examination-Participation Restrictions Community Activity;Yard Work;Laundry;Cleaning;Shop    Stability/Clinical Decision Making Evolving/Moderate complexity    Rehab Potential Fair    PT Frequency Other (comment)   2-3x/week   PT Duration Other (comment)   16 weeks   PT Treatment/Interventions Electrical Stimulation;Gait training;Stair training;Functional mobility training;Therapeutic activities;Therapeutic exercise;Balance training;Neuromuscular re-education;Wheelchair mobility training;Patient/family education    PT Next Visit Plan Gait re-training, task practice for transferring, LE strengthening, UE AROM and strengthening to assist with functional mobility and transfers. Weight-shifting and pre-gait activities with decreasing UE support.    PT Home Exercise Plan Pt continuing with home health exercises. Pt continuing with task practice for rolling, scooting, bridging in bed; walking with CGA with FWW; reaching and grasping with LUE. 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, initial encounter [facility])     Problem List Patient Active Problem List   Diagnosis Date Noted   Generalized anxiety disorder 01/24/2021   Suspected deep tissue injury 10/25/2020   Wheelchair dependence 11/27/2019   Spasticity 11/27/2019   Hyperkalemia    Steroid-induced hyperglycemia    Prediabetes    Neurogenic bowel    Neurogenic bladder    Neuropathic pain    Quadriplegia ([place]) 08/31/2019   S/P cervical spinal fusion 08/28/2019   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 [place], [place], [address] Phone: [phone]   Fax:  [phone]  Name: KHARLIE BRING MRN: [id number] Date of Birth: [birth year]

## 2021-02-19 ENCOUNTER — Encounter: Payer: Self-pay | Admitting: Physical Therapy

## 2021-02-20 ENCOUNTER — Ambulatory Visit: Payer: Medicare PPO | Admitting: Physical Therapy

## 2021-02-23 NOTE — Progress Notes (Signed)
Suprapubic Cath Change  Patient is present today for a suprapubic catheter change due to urinary retention.  8 ml of water was drained from the balloon, a 16 FR foley cath was removed from the tract with out difficulty.  Site was cleaned and prepped in a sterile fashion with betadine.  A 16 FR foley cath was replaced into the tract no complications were noted. Urine return was noted, 10 ml of sterile water was inflated into the balloon and a night bag was attached for drainage.  Patient tolerated well.  Performed by: Zara Council, PA-C  Follow up: one month for Foley catheter exchange

## 2021-02-24 ENCOUNTER — Ambulatory Visit: Payer: Medicare PPO | Admitting: Urology

## 2021-02-24 ENCOUNTER — Encounter: Payer: Self-pay | Admitting: Urology

## 2021-02-24 ENCOUNTER — Encounter: Payer: Medicare PPO | Admitting: Physical Therapy

## 2021-02-24 ENCOUNTER — Other Ambulatory Visit: Payer: Self-pay

## 2021-02-24 VITALS — BP 118/71 | HR 80 | Ht 63.0 in | Wt 200.0 lb

## 2021-02-24 DIAGNOSIS — Z9359 Other cystostomy status: Secondary | ICD-10-CM

## 2021-02-26 ENCOUNTER — Other Ambulatory Visit: Payer: Self-pay

## 2021-02-26 ENCOUNTER — Ambulatory Visit: Payer: Medicare PPO | Admitting: Physical Therapy

## 2021-02-26 ENCOUNTER — Encounter: Payer: Self-pay | Admitting: Physical Therapy

## 2021-02-26 DIAGNOSIS — R262 Difficulty in walking, not elsewhere classified: Secondary | ICD-10-CM | POA: Diagnosis not present

## 2021-02-26 DIAGNOSIS — M6281 Muscle weakness (generalized): Secondary | ICD-10-CM

## 2021-02-26 DIAGNOSIS — S14123A 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, initial encounter: Secondary | ICD-10-CM

## 2021-02-26 NOTE — Therapy (Signed)
[facility] [address]. [place], [place], [address] Phone: [phone]   Fax:  [phone]  Physical Therapy Treatment  Patient Details  Name: Meredith Roach MRN: [id number] Date of Birth: October 03, 1940 Referring Provider (PT): [name], MD   Encounter Date: 02/26/2021   PT End of Session - 02/26/21 1330     Visit Number 34    Number of Visits 15    Date for PT Re-Evaluation 03/18/21    Authorization Time Period Cert 9/38/18-29/93/71, last progress note 01/15/21    Progress Note Due on Visit 36    PT Start Time 1327    PT Stop Time 1415    PT Time Calculation (min) 48 min    Equipment Utilized During Treatment Gait belt   Pt uses power wheelchair, clinic's front-wheeled walker utilized for gait   Activity Tolerance Patient tolerated treatment well    Behavior During Therapy WFL for tasks assessed/performed             Past Medical History:  Diagnosis Date   Arthritis    GERD (gastroesophageal reflux disease)    Gout    Neuromuscular disorder ([place])    Pre-diabetes     Past Surgical History:  Procedure Laterality Date   ANTERIOR CERVICAL DECOMPRESSION/DISCECTOMY FUSION 4 LEVELS N/A 08/28/2019   Procedure: ANTERIOR CERVICAL DECOMPRESSION/DISCECTOMY FUSION CERVICALTHREE-FOUR CERVICAL,FOUR-FIVE,CERVICAL FIVE-SIX,CERVICAL SIX-SEVEN.;  Surgeon: [name], MD;  Location: [facility];  Service: Neurosurgery;  Laterality: N/A;  ANTERIOR   BACK SURGERY     CATARACT EXTRACTION W/PHACO Right 03/31/2018   Procedure: CATARACT EXTRACTION PHACO AND INTRAOCULAR LENS PLACEMENT ([place]);  Surgeon: [name], MD;  Location: [facility];  Service: Ophthalmology;  Laterality: Right;  [place] 00:39.7 CDE 4.35 Fluid Pack Lot # [id number]   CHOLECYSTECTOMY  1995   COLONOSCOPY WITH PROPOFOL N/A 06/07/2017   Procedure: COLONOSCOPY WITH PROPOFOL;  Surgeon: [name], MD;  Location: [facility] ENDOSCOPY;  Service: Endoscopy;  Laterality: N/A;    ESOPHAGOGASTRODUODENOSCOPY (EGD) WITH PROPOFOL N/A 06/07/2017   Procedure: ESOPHAGOGASTRODUODENOSCOPY (EGD) WITH PROPOFOL;  Surgeon: [name], MD;  Location: [facility] ENDOSCOPY;  Service: Endoscopy;  Laterality: N/A;   IR CATHETER TUBE CHANGE  05/10/2020   REDUCTION MAMMAPLASTY Bilateral 1994    There were no vitals filed for this visit.   Subjective Assessment - 02/26/21 1329     Subjective Patient reports continued work on walking at home. She is most challenged by rolling in bed. Pt is continuing to work with fine motor exercises with L hand at home.    Patient is accompained by: Family member   husband   Pertinent History Patient is a 80 year old female s/p incomplete SCI. She had incomplete SCI following fall - C4 [place] D (DOI: 08/26/19). Primary activity limitations c difficulty walking, transferring, and completing independent functional mobility following incomplete SCI. She has undergone home health therapy for about one year. She and her husband reports she was able to transfer and ambulate with walker in home with PT walking beside her. Pt has practiced with front-wheel walker at home. 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 Patient reports using steady transfer aid for performing transfers chair to bed and W/C to standard chair. Her husband states that she is able to stand with steady device and can stand with her legs against the bed. Pt has ramp to get in her home for her power wheelchair. Patient lives in one level home. Pt uses tub bench that she  can utilize to slide into shower; her daughter/husband assists with bathing. Pt has [employer]i that can accommodate her power chair at this time. Pt has concrete driveway to get up to her ramp. Pt has small threshold to get into her home - husband utilizes cardboard to get over threshold.    Limitations Walking;Standing;House hold activities;Other (comment)   transferring, bed mobility, bladder management   Patient Stated Goals to be  able to walk more independently                TREATMENT     Therapeutic Activities - patient education, repetitive task practice for improved performance of daily functional activities e.g. transferring, bed mobility      Sit to stand and gait to edge of table with modified-independent stand to sit prior to initiation of sitting/lying exercises; ambulation x 6 feet with 90-degree left turn prior to sitting edge of low mat   Performance of supine to/from sit with pt able to assume partial sidelying position onto L forearm, dependent with lower limb management to attain lying position Performance of independent scooting on table to move from edge of mat toward middle  Bed mobility task practice: Performed bilateral rolling on low mat with Independent roll to L x 5; roll to R x 5 [MaxA to initiate LUE cross-body shoulder adduction and to facilitate truncal rotation via pelvis, pt able to complete last 50% of roll with Minimal assist]   Gait with FWW in clinic; SBA/supervision throughout gait [cueing for toe clearance]; 3 laps around gym          *not today* Sit to stand from raised table, 2x10 with table height, 21-inches for height of table Bridging; x25 as needed for scooting and re-positioning pelvis on bed  Sitting edge of bed for partial weightbearing onto bilateral LE, seated march with no UE support; 2x20 with alternating UE/LE  In // bars: Standing march with UE support on // bars; 2x12 alternating Standing heel raise/toe raise; x20 alternating         Neuromuscular Re-education - for increased motor recruitment of major muscle groups with motor impairment, nervous system priming and exercises to promote trunk stability    Supine marching, tactile cue for pt attempting to get 90 deg hip flexion [limited hip flexion AROM on L]; 2x10 alternating     Standing adjacent to raised low mat, in confines of front-wheel walker: Minisquat with butt tap to raised table; x10  [difficulty with eccentric control]  Standing march with R upper extremity support only on single parallel bar, 2x10 alternating [full clearance of RLE, unweighting of LLE with inconsistent clearance from floor]   Standing heel-to-toe step alternating LE; 2x10 alternating  Unilateral upper extremity reaching and grasping in standing position with minimal UE support, with bilateral fine motor task, pulp to pulp grasp for metal nuts, moving to adjacent container requiring cross-body reaching; x 3 min      *next visit* Rolling Theraputty (yellow Theraputty) in standing to facilitate improved trunk control and lower limb support without UE assist and digit/MCP extension; x 2 minutes      *not today* Standing toe tapping onto Airex pad to promote active dorsiflexion during LE swing; x20 bilateral LE Sitting on purple disc, catching and throwing with bilateral UE with small black basketball (smaller size for increased demand on reaching and grasp accuracy), multi-directional reaching and bilateral grasping; x 5 min  Unilateral upper extremity reaching and grasping in standing position with minimal UE support (touching on rail  prn if losing balance); fine motor grasping and trunk rotation to place items into container on opposite side of body (small clothes pins, small cylinder and cubes of various sizes, nuts, spheres - from "HAND PT" packet); x 2 min  Seated march on purple disc with alternating UE/LE; x25 alternating Sitting on purple disc, catching balloon and balloon tapping back to therapist - to facilitate active cross body adduction and trunk rotation as needed for rolling and reaching/grasping tasks (including low and high punches reaching today); x 2 minute catching, x 2 minutes balloon tapping         ASSESSMENT Patient is able to grasp 5 metal nuts consecutively with pulp-to-pulp grasp with L hand. She is able to clear LLE from floor < 1 second on 2 attempts; she can fully shift weight  onto L lower limb with R unilateral upper extremity support on walker. She is able to perform L rolling independently on low mat today, marking the first in-clinic attempted roll performed with 100% independence. Pt is still significantly challenged with management of lower limbs during supine to sit/sitting to supine and with rolling to R side given L truncal weakness and limited L shoulder AROM. Patient has remaining deficits in increased tone of wrist/digit and elbow flexors (L>RUE), decreased L>R shoulder AROM, impaired trunk control, decreased LE strength and motor recruitment, gait deficits/deviations (most notably difficulty with L>R toe clearance during gait that is more apparent with fatigue), full weight acceptance onto bilateral LE, and impaired postural stability in standing. Patient will benefit from continued skilled therapeutic intervention to address the above deficits as needed for improved function and QoL.         PT Short Term Goals - 02/10/21 1323       PT SHORT TERM GOAL #1   Title Patient will perform stand-pivot transfer from chair to bed with supervision level of assist and ModI setup of power chair as needed for transferring at home    Baseline 11/26/20: Max assist for setup and mod assist to perform sit to stand, unsteady pivot prior to performing stand to sit.    12/23/20: Patient is able to setup power chair for transfer ModI. Mod to Max assist for sit to stand. CGA level of assist for standing pivot and stand to sit on low mat.   01/15/21: Setup of power chair without assist; modified independent pivot and stand-to-sit; ModA for initial set to stand prior to pivot.  02/10/21: ModI setup of power chair, [facility] for initiation of sit to stand, modified-independent stand pivot transfer    Time 4    Period Weeks    Status Partially Met    Target Date 03/03/21      PT SHORT TERM GOAL #2   Title Patient will perform standing toe tap to 3-inch surface (Airex pad) with bilateral upper  extremity support indicative of improved ability to clear feet from floor during stepping and improved weight shift to each LE with AD use    Baseline 11/26/20: Limited toe clearance during bilateral LE swing phase with front-wheeled walker.   12/23/20: performed at previous follow-up with bilateral UE support.    Time 3    Period Weeks    Status Achieved    Target Date 12/17/20               PT Long Term Goals - 02/10/21 1325       PT LONG TERM GOAL #1   Title Patient will demonstrate improved function as evidenced  by a score of 35 on FOTO measure for full participation in activities at home and in the community.    Baseline 11/26/20: FOTO 12.   12/23/20: FOTO 24.    01/15/21: FOTO 23.   02/10/21: FOTO 38    Time 16    Period Weeks    Status Achieved    Target Date 03/18/21      PT LONG TERM GOAL #2   Title Patient will ambulate for 150 feet without LOB, with sufficient toe clearance to prevent loss of gait stability, and c proper AD placement with least-restrictive assistive device with supervision level of assist    Baseline 11/26/20: Gait x 40 feet with CGA.   12/23/20: CGA with dec toe clearance L>RLE.   01/15/21: Ambulated 160 feet without LOB, proper AD placement; good toe clearance with first 80 feet, but decrased L>R toe clearanc with fatigue.   02/10/21: Performed with FWW with good toe clearance with first 80 feet, decreasing L toe clearance with succesive steps.    Time 12    Period Weeks    Status Partially Met    Target Date 03/03/21      PT LONG TERM GOAL #3   Title Patient will perform independent re-positioning in chair with use of closed-chain triceps on armrests as needed for independent wheelchair mobility with no verbal cueing or tactile cueing required    Baseline 11/26/20: Dependent re-positioning in wheelchair.   12/23/20: Performed with feet on fold-out platform of her power wheelchair IND.    Time 8    Period Weeks    Status Achieved    Target Date 12/23/20       PT LONG TERM GOAL #4   Title Patient will perform simulated step-over task for clearing doorway threshold with use of front-wheeled walker as needed for entering/exiting home with supervision level of assistance and no LOB    Baseline 11/26/20: Pt currently uses power wheelchair to negotiate threshold into home.   12/23/20: Deferred.  01/15/21: Deferred.   02/10/21: Performed last week with step-over single line on floor with negligible height.    Time 16    Period Weeks    Status Partially Met    Target Date 03/18/21                   Plan - 02/27/21 1214     Clinical Impression Statement Patient is able to grasp 5 metal nuts consecutively with pulp-to-pulp grasp with L hand. She is able to clear LLE from floor < 1 second; she can fully shift weight onto L lower limb with R unilateral upper extremity support on walker. She is able to perform L rolling independently on low mat today, marking the first in-clinic attempted roll performed with 100% independence. Pt is still significantly challenged with management of lower limbs during supine to sit/sitting to supine and with rolling to R side given L truncal weakness and limited L shoulder AROM. Patient has remaining deficits in increased tone of wrist/digit and elbow flexors (L>RUE), decreased L>R shoulder AROM, impaired trunk control, decreased LE strength and motor recruitment, gait deficits/deviations (most notably difficulty with L>R toe clearance during gait that is more apparent with fatigue), full weight acceptance onto bilateral LE, and impaired postural stability in standing. Patient will benefit from continued skilled therapeutic intervention to address the above deficits as needed for improved function and QoL.    Personal Factors and Comorbidities Age;Comorbidity 3+;Time since onset of injury/illness/exacerbation    Comorbidities GERD,  pre-diabetes, obesity, gout, s/p C3-7 ACDF    Examination-Activity Limitations  Bathing;Continence;Reach Overhead;Stairs;Bed Mobility;Dressing;Self Feeding;Stand;Hygiene/Grooming;Toileting;Transfers;Locomotion Level    Examination-Participation Restrictions Community Activity;Yard Work;Laundry;Cleaning;Shop    Stability/Clinical Decision Making Evolving/Moderate complexity    Rehab Potential Fair    PT Frequency Other (comment)   2-3x/week   PT Duration Other (comment)   16 weeks   PT Treatment/Interventions Electrical Stimulation;Gait training;Stair training;Functional mobility training;Therapeutic activities;Therapeutic exercise;Balance training;Neuromuscular re-education;Wheelchair mobility training;Patient/family education    PT Next Visit Plan Gait re-training, task practice for transferring, LE strengthening, UE AROM and strengthening to assist with functional mobility and transfers. Weight-shifting and pre-gait activities with decreasing UE support.    PT Home Exercise Plan Pt continuing with home health exercises. Pt continuing with task practice for rolling, scooting, bridging in bed; walking with CGA with FWW; reaching and grasping with LUE. 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, initial encounter [facility])     Problem List Patient Active Problem List   Diagnosis Date Noted   Generalized anxiety disorder 01/24/2021   Suspected deep tissue injury 10/25/2020   Wheelchair dependence 11/27/2019   Spasticity 11/27/2019   Hyperkalemia    Steroid-induced hyperglycemia     Prediabetes    Neurogenic bowel    Neurogenic bladder    Neuropathic pain    Quadriplegia ([place]) 08/31/2019   S/P cervical spinal fusion 08/28/2019   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 [place], [place], [address] Phone: [phone]   Fax:  [phone]  Name: Meredith Roach MRN: [id number] Date of Birth: 12-Jun-1940

## 2021-02-27 ENCOUNTER — Encounter: Payer: Self-pay | Admitting: Physical Therapy

## 2021-02-27 ENCOUNTER — Ambulatory Visit: Payer: Medicare PPO | Admitting: Physical Therapy

## 2021-02-27 DIAGNOSIS — R262 Difficulty in walking, not elsewhere classified: Secondary | ICD-10-CM | POA: Diagnosis not present

## 2021-02-27 DIAGNOSIS — M6281 Muscle weakness (generalized): Secondary | ICD-10-CM

## 2021-02-27 DIAGNOSIS — S14123A 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, initial encounter: Secondary | ICD-10-CM

## 2021-02-27 NOTE — Therapy (Signed)
[facility] [address]. [place], [place], [address] Phone: [phone]   Fax:  [phone]  Physical Therapy Treatment  Patient Details  Name: Meredith Roach MRN: [id number] Date of Birth: [birth year] Referring Provider (PT): [name], MD   Encounter Date: 02/27/2021   PT End of Session - 03/01/21 1051     Visit Number 35    Number of Visits 48    Date for PT Re-Evaluation 03/18/21    Authorization Time Period Cert 81/44/81-85/63/14, last progress note 02/10/21; current auth 11/10-12/29    Progress Note Due on Visit 75    PT Start Time 1256    PT Stop Time 1345    PT Time Calculation (min) 49 min    Equipment Utilized During Treatment Gait belt   Pt uses power wheelchair, clinic's front-wheeled walker utilized for gait   Activity Tolerance Patient tolerated treatment well    Behavior During Therapy WFL for tasks assessed/performed             Past Medical History:  Diagnosis Date   Arthritis    GERD (gastroesophageal reflux disease)    Gout    Neuromuscular disorder ([place])    Pre-diabetes     Past Surgical History:  Procedure Laterality Date   ANTERIOR CERVICAL DECOMPRESSION/DISCECTOMY FUSION 4 LEVELS N/A 08/28/2019   Procedure: ANTERIOR CERVICAL DECOMPRESSION/DISCECTOMY FUSION CERVICALTHREE-FOUR CERVICAL,FOUR-FIVE,CERVICAL FIVE-SIX,CERVICAL SIX-SEVEN.;  Surgeon: [name], MD;  Location: [place];  Service: Neurosurgery;  Laterality: N/A;  ANTERIOR   BACK SURGERY     CATARACT EXTRACTION W/PHACO Right 03/31/2018   Procedure: CATARACT EXTRACTION PHACO AND INTRAOCULAR LENS PLACEMENT ([place]);  Surgeon: [name], MD;  Location: [facility];  Service: Ophthalmology;  Laterality: Right;  [place] 00:39.7 CDE 4.35 Fluid Pack Lot # [id number]   CHOLECYSTECTOMY  1995   COLONOSCOPY WITH PROPOFOL N/A 06/07/2017   Procedure: COLONOSCOPY WITH PROPOFOL;  Surgeon: [name], MD;  Location: [facility] ENDOSCOPY;  Service:  Endoscopy;  Laterality: N/A;   ESOPHAGOGASTRODUODENOSCOPY (EGD) WITH PROPOFOL N/A 06/07/2017   Procedure: ESOPHAGOGASTRODUODENOSCOPY (EGD) WITH PROPOFOL;  Surgeon: [name], MD;  Location: [facility] ENDOSCOPY;  Service: Endoscopy;  Laterality: N/A;   IR CATHETER TUBE CHANGE  05/10/2020   REDUCTION MAMMAPLASTY Bilateral 1994    There were no vitals filed for this visit.   Subjective Assessment - 03/01/21 1051     Subjective Patient reports some tightness in her L ankle/foot that is better today versus yesterday. She reports feeling fairly well and tolerating yesterday's session well.    Patient is accompained by: Family member   husband   Pertinent History Patient is a 80 year old female s/p incomplete SCI. She had incomplete SCI following fall - C4 [place] D (DOI: 08/26/19). Primary activity limitations c difficulty walking, transferring, and completing independent functional mobility following incomplete SCI. She has undergone home health therapy for about one year. She and her husband reports she was able to transfer and ambulate with walker in home with PT walking beside her. Pt has practiced with front-wheel walker at home. 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 Patient reports using steady transfer aid for performing transfers chair to bed and W/C to standard chair. Her husband states that she is able to stand with steady device and can stand with her legs against the bed. Pt has ramp to get in her home for her power wheelchair. Patient lives in one level home. Pt uses tub bench that she can utilize to  slide into shower; her daughter/husband assists with bathing. Pt has [employer] Lei that can accommodate her power chair at this time. Pt has concrete driveway to get up to her ramp. Pt has small threshold to get into her home - husband utilizes cardboard to get over threshold.    Limitations Walking;Standing;House hold activities;Other (comment)   transferring, bed mobility, bladder management    Patient Stated Goals to be able to walk more independently                  TREATMENT     Therapeutic Activities - patient education, repetitive task practice for improved performance of daily functional activities e.g. transferring, bed mobility    Sit to stand and gait to edge of table with modified-independent stand to sit prior to initiation of sitting/lying exercises; ambulation x 6 feet with 90-degree left turn prior to sitting edge of low mat   Performance of supine to/from sit with pt able to assume partial sidelying position onto L forearm, dependent with lower limb management to attain lying position Performance of independent scooting on table to move from edge of mat toward middle   Bed mobility task practice: Performed bilateral rolling on low mat with Independent roll to L x 5; roll to R x 5 [MaxA to initiate LUE cross-body shoulder adduction and to facilitate truncal rotation via pelvis, pt able to complete last 50% of roll with Minimal assist]             *not today* Gait with FWW in clinic; SBA/supervision throughout gait [cueing for toe clearance]; 3 laps around gym  Sit to stand from raised table, 2x10 with table height, 21-inches for height of table Bridging; x25 as needed for scooting and re-positioning pelvis on bed  Sitting edge of bed for partial weightbearing onto bilateral LE, seated march with no UE support; 2x20 with alternating UE/LE  In // bars: Standing march with UE support on // bars; 2x12 alternating Standing heel raise/toe raise; x20 alternating         Neuromuscular Re-education - for increased motor recruitment of major muscle groups with motor impairment, nervous system priming and exercises to promote trunk stability    Supine alternating UE/LE (dead bug technique in hooklying), tactile cue for pt attempting to get 90 deg hip flexion [limited hip flexion AROM on L]; 2x10 alternating     Standing adjacent to raised low mat, in  confines of front-wheel walker: Minisquat at edge of table; 2x12 with therapist guarding posteriorly in case of loss of descending control   In // bars: Standing march with R upper extremity support only on single parallel bar, 2x10 alternating [full clearance of RLE, unweighting of LLE with inconsistent clearance from floor]   Standing heel-to-toe step over blue line; alternating LE; 2x10 alternating   Standing toe tapping onto wooden beam to promote active dorsiflexion during LE swing; x20 bilateral LE     *next visit* Unilateral upper extremity reaching and grasping in standing position with minimal UE support, with bilateral fine motor task, pulp to pulp grasp for metal nuts, moving to adjacent container requiring cross-body reaching; x 2 min Rolling Theraputty (yellow Theraputty) in standing to facilitate improved trunk control and lower limb support without UE assist and digit/MCP extension; x 2 minutes       *not today* Sitting on purple disc, catching and throwing with bilateral UE with small black basketball (smaller size for increased demand on reaching and grasp accuracy), multi-directional reaching and bilateral grasping; x  5 min  Unilateral upper extremity reaching and grasping in standing position with minimal UE support (touching on rail prn if losing balance); fine motor grasping and trunk rotation to place items into container on opposite side of body (small clothes pins, small cylinder and cubes of various sizes, nuts, spheres - from "HAND PT" packet); x 2 min  Seated march on purple disc with alternating UE/LE; x25 alternating Sitting on purple disc, catching balloon and balloon tapping back to therapist - to facilitate active cross body adduction and trunk rotation as needed for rolling and reaching/grasping tasks (including low and high punches reaching today); x 2 minute catching, x 2 minutes balloon tapping         ASSESSMENT Patient is able to demonstrate  independent L-sided rolling again today (supine to L sidelying). She has significant difficulty with supine to R sidelying given L-sided truncal/abdominal weakness and L upper limb decreased AROM. She exhibits improving ability to perform scooting on low mat to translate on table. She demonstrates improving ability to clear line on floor (will progress to simulation for clearing threshold) and perform heel-toe-toe progression with stepping activity in // bars. She is able to clear bilateral toes during swing phase of gait, but this is significantly diminished following motor fatigue. Patient has remaining deficits in increased tone of wrist/digit and elbow flexors (L>RUE), decreased L>R shoulder AROM, impaired trunk control, decreased LE strength and motor recruitment, gait deficits/deviations (most notably difficulty with L>R toe clearance during gait that is more apparent with fatigue), full weight acceptance onto bilateral LE, and impaired postural stability in standing. Patient will benefit from continued skilled therapeutic intervention to address the above deficits as needed for improved function and QoL.           PT Short Term Goals - 02/10/21 1323       PT SHORT TERM GOAL #1   Title Patient will perform stand-pivot transfer from chair to bed with supervision level of assist and ModI setup of power chair as needed for transferring at home    Baseline 11/26/20: Max assist for setup and mod assist to perform sit to stand, unsteady pivot prior to performing stand to sit.    12/23/20: Patient is able to setup power chair for transfer ModI. Mod to Max assist for sit to stand. CGA level of assist for standing pivot and stand to sit on low mat.   01/15/21: Setup of power chair without assist; modified independent pivot and stand-to-sit; ModA for initial set to stand prior to pivot.  02/10/21: ModI setup of power chair, [place] for initiation of sit to stand, modified-independent stand pivot transfer    Time  4    Period Weeks    Status Partially Met    Target Date 03/03/21      PT SHORT TERM GOAL #2   Title Patient will perform standing toe tap to 3-inch surface (Airex pad) with bilateral upper extremity support indicative of improved ability to clear feet from floor during stepping and improved weight shift to each LE with AD use    Baseline 11/26/20: Limited toe clearance during bilateral LE swing phase with front-wheeled walker.   12/23/20: performed at previous follow-up with bilateral UE support.    Time 3    Period Weeks    Status Achieved    Target Date 12/17/20               PT Long Term Goals - 02/10/21 1325  PT LONG TERM GOAL #1   Title Patient will demonstrate improved function as evidenced by a score of 35 on FOTO measure for full participation in activities at home and in the community.    Baseline 11/26/20: FOTO 12.   12/23/20: FOTO 24.    01/15/21: FOTO 23.   02/10/21: FOTO 38    Time 16    Period Weeks    Status Achieved    Target Date 03/18/21      PT LONG TERM GOAL #2   Title Patient will ambulate for 150 feet without LOB, with sufficient toe clearance to prevent loss of gait stability, and c proper AD placement with least-restrictive assistive device with supervision level of assist    Baseline 11/26/20: Gait x 40 feet with CGA.   12/23/20: CGA with dec toe clearance L>RLE.   01/15/21: Ambulated 160 feet without LOB, proper AD placement; good toe clearance with first 80 feet, but decrased L>R toe clearanc with fatigue.   02/10/21: Performed with FWW with good toe clearance with first 80 feet, decreasing L toe clearance with succesive steps.    Time 12    Period Weeks    Status Partially Met    Target Date 03/03/21      PT LONG TERM GOAL #3   Title Patient will perform independent re-positioning in chair with use of closed-chain triceps on armrests as needed for independent wheelchair mobility with no verbal cueing or tactile cueing required    Baseline 11/26/20:  Dependent re-positioning in wheelchair.   12/23/20: Performed with feet on fold-out platform of her power wheelchair IND.    Time 8    Period Weeks    Status Achieved    Target Date 12/23/20      PT LONG TERM GOAL #4   Title Patient will perform simulated step-over task for clearing doorway threshold with use of front-wheeled walker as needed for entering/exiting home with supervision level of assistance and no LOB    Baseline 11/26/20: Pt currently uses power wheelchair to negotiate threshold into home.   12/23/20: Deferred.  01/15/21: Deferred.   02/10/21: Performed last week with step-over single line on floor with negligible height.    Time 16    Period Weeks    Status Partially Met    Target Date 03/18/21                   Plan - 03/01/21 1058     Clinical Impression Statement Patient is able to demonstrate independent L-sided rolling again today (supine to L sidelying). She has significant difficulty with supine to R sidelying given L-sided truncal/abdominal weakness and L upper limb decreased AROM. She exhibits improving ability to perform scooting on low mat to translate on table. She demonstrates improving ability to clear line on floor (will progress to simulation for clearing threshold) and perform heel-toe-toe progression with stepping activity in // bars. She is able to clear bilateral toes during swing phase of gait, but this is significantly diminished following motor fatigue. Patient has remaining deficits in increased tone of wrist/digit and elbow flexors (L>RUE), decreased L>R shoulder AROM, impaired trunk control, decreased LE strength and motor recruitment, gait deficits/deviations (most notably difficulty with L>R toe clearance during gait that is more apparent with fatigue), full weight acceptance onto bilateral LE, and impaired postural stability in standing. Patient will benefit from continued skilled therapeutic intervention to address the above deficits as needed for  improved function and QoL.    Personal Factors  and Comorbidities Age;Comorbidity 3+;Time since onset of injury/illness/exacerbation    Comorbidities GERD, pre-diabetes, obesity, gout, s/p C3-7 ACDF    Examination-Activity Limitations Bathing;Continence;Reach Overhead;Stairs;Bed Mobility;Dressing;Self Feeding;Stand;Hygiene/Grooming;Toileting;Transfers;Locomotion Level    Examination-Participation Restrictions Community Activity;Yard Work;Laundry;Cleaning;Shop    Stability/Clinical Decision Making Evolving/Moderate complexity    Rehab Potential Fair    PT Frequency Other (comment)   2-3x/week   PT Duration Other (comment)   16 weeks   PT Treatment/Interventions Electrical Stimulation;Gait training;Stair training;Functional mobility training;Therapeutic activities;Therapeutic exercise;Balance training;Neuromuscular re-education;Wheelchair mobility training;Patient/family education    PT Next Visit Plan Gait re-training, task practice for transferring, LE strengthening, UE AROM and strengthening to assist with functional mobility and transfers. Weight-shifting and pre-gait activities with decreasing UE support.    PT Home Exercise Plan Pt continuing with home health exercises. Pt continuing with task practice for rolling, scooting, bridging in bed; walking with CGA with FWW; reaching and grasping with LUE. 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, initial encounter [facility])     Problem List Patient Active  Problem List   Diagnosis Date Noted   Generalized anxiety disorder 01/24/2021   Suspected deep tissue injury 10/25/2020   Wheelchair dependence 11/27/2019   Spasticity 11/27/2019   Hyperkalemia    Steroid-induced hyperglycemia    Prediabetes    Neurogenic bowel    Neurogenic bladder    Neuropathic pain    Quadriplegia ([place]) 08/31/2019   S/P cervical spinal fusion 08/28/2019   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 [place], [place], [address] Phone: [phone]   Fax:  [phone]  Name: Meredith Roach MRN: [id number] Date of Birth: 07-16-1940

## 2021-03-03 ENCOUNTER — Ambulatory Visit: Payer: Medicare PPO | Admitting: Physical Therapy

## 2021-03-03 ENCOUNTER — Other Ambulatory Visit: Payer: Self-pay

## 2021-03-03 ENCOUNTER — Encounter: Payer: Self-pay | Admitting: Physical Therapy

## 2021-03-03 DIAGNOSIS — R262 Difficulty in walking, not elsewhere classified: Secondary | ICD-10-CM

## 2021-03-03 DIAGNOSIS — M6281 Muscle weakness (generalized): Secondary | ICD-10-CM

## 2021-03-03 DIAGNOSIS — S14123A 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, initial encounter: Secondary | ICD-10-CM

## 2021-03-03 NOTE — Therapy (Signed)
**Note Meredith-Identified via Obfuscation** [facility] [address] [place], [place], [address] Phone: [phone]   Fax:  [phone]  Physical Therapy Treatment  Patient Details  Name: Meredith Roach MRN: [id number] Date of Birth: 11/16/1940 Referring Provider (PT): Meredith Heys, MD   Encounter Date: 03/03/2021   PT End of Session - 03/03/21 1210     Visit Number 36    Number of Visits 48    Date for PT Re-Evaluation 03/18/21    Authorization Time Period Cert 47/65/46-50/35/46, last progress note 02/10/21; current auth 11/10-12/29    Progress Note Due on Visit 70    PT Start Time 1147    PT Stop Time 1235    PT Time Calculation (min) 48 min    Equipment Utilized During Treatment Gait belt   Pt uses power wheelchair, clinic's front-wheeled walker utilized for gait   Activity Tolerance Patient tolerated treatment well    Behavior During Therapy WFL for tasks assessed/performed             Past Medical History:  Diagnosis Date   Arthritis    GERD (gastroesophageal reflux disease)    Gout    Neuromuscular disorder (Meredith Roach)    Pre-diabetes     Past Surgical History:  Procedure Laterality Date   ANTERIOR CERVICAL DECOMPRESSION/DISCECTOMY FUSION 4 LEVELS N/A 08/28/2019   Procedure: ANTERIOR CERVICAL DECOMPRESSION/DISCECTOMY FUSION CERVICALTHREE-FOUR CERVICAL,FOUR-FIVE,CERVICAL FIVE-SIX,CERVICAL SIX-SEVEN.;  Surgeon: Meredith Moore, MD;  Location: [place];  Service: Neurosurgery;  Laterality: N/A;  ANTERIOR   BACK SURGERY     CATARACT EXTRACTION W/PHACO Right 03/31/2018   Procedure: CATARACT EXTRACTION PHACO AND INTRAOCULAR LENS PLACEMENT ([place]);  Surgeon: Meredith Meiers, MD;  Location: [facility];  Service: Ophthalmology;  Laterality: Right;  [place] 00:39.7 CDE 4.35 Fluid Pack Lot # [id number]   CHOLECYSTECTOMY  1995   COLONOSCOPY WITH PROPOFOL N/A 06/07/2017   Procedure: COLONOSCOPY WITH PROPOFOL;  Surgeon: Meredith Silvas, MD;  Location: [facility] ENDOSCOPY;  Service:  Endoscopy;  Laterality: N/A;   ESOPHAGOGASTRODUODENOSCOPY (EGD) WITH PROPOFOL N/A 06/07/2017   Procedure: ESOPHAGOGASTRODUODENOSCOPY (EGD) WITH PROPOFOL;  Surgeon: Meredith Silvas, MD;  Location: [facility] END[facility]COPY;  Service: Endoscopy;  Laterality: N/A;   IR CATHETER TUBE CHANGE  05/10/2020   REDUCTION MAMMAPLASTY Bilateral 1994    There were no vitals filed for this visit.   Subjective Assessment - 03/03/21 1153     Subjective Patient reports having notable L thigh and L lower limb pain down to her L toe last night. She reports feeling it around toe/ankle and feels that it radiates up to her L thigh. She reports notable tightness with attempting to bend her L knee. Patient reports feeling tight in L lower limb today versus pain.    Patient is accompained by: Family member   husband   Pertinent History Patient is a 80 year old female s/p incomplete SCI. She had incomplete SCI following fall - C4 Meredith Roach (DOI: 08/26/19). Primary activity limitations c difficulty walking, transferring, and completing independent functional mobility following incomplete SCI. She has undergone home health therapy for about one year. She and her husband reports she was able to transfer and ambulate with walker in home with PT walking beside her. Pt has practiced with front-wheel walker at home. 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 Patient reports using steady transfer aid for performing transfers chair to bed and W/C to standard chair. Her husband states that she is able to stand with steady device and can stand with  her legs against the bed. Pt has ramp to get in her home for her power wheelchair. Patient lives in one level home. Pt uses tub bench that she can utilize to slide into shower; her daughter/husband assists with bathing. Pt has Meredith Roach that can accommodate her power chair at this time. Pt has concrete driveway to get up to her ramp. Pt has small threshold to get into her home - husband utilizes cardboard  to get over threshold.    Limitations Walking;Standing;House hold activities;Other (comment)   transferring, bed mobility, bladder management   Patient Stated Goals to be able to walk more independently                TREATMENT   Manual Therapy - for soft tissue sensitivity and mobility, joint mobility, ROM as needed for toe clearance during stepping  Passive L ankle dorsiflexion stretching; 3x30sec L ankle PROM within pt tolerance L knee/hip combined flexion PROM within pt tolerance    Therapeutic Activities - patient education, repetitive task practice for improved performance of daily functional activities e.g. transferring, bed mobility    Modified-independent stand-pivot transfer with FWW, power chair to low mat (with 90-degree left turn); stand-by assist   Performance of supine to/from sit with pt able to assume partial sidelying position onto L forearm, dependent with lower limb management to attain lying position Performance of independent scooting on table to move from edge of mat toward middle   Gait with FWW in clinic; SBA/supervision throughout gait [cueing for toe clearance]; 3 laps around gym    Demonstrated and reviewed dorsiflexion stretching with bedsheet in supine; performed for 30 seconds with verbal cueing for holding for full duration of stretch and direction of motion     *not today* Bed mobility task practice: Performed bilateral rolling on low mat with Independent roll to L x 5; roll to R x 5 [MaxA to initiate LUE cross-body shoulder adduction and to facilitate truncal rotation via pelvis, pt able to complete last 50% of roll with Minimal assist] Sit to stand from raised table, 2x10 with table height, 21-inches for height of table Bridging; x25 as needed for scooting and re-positioning pelvis on bed  Sitting edge of bed for partial weightbearing onto bilateral LE, seated march with no UE support; 2x20 with alternating UE/LE  In // bars: Standing march  with UE support on // bars; 2x12 alternating Standing heel raise/toe raise; x20 alternating  Sit to stand and gait to edge of table with modified-independent stand to sit prior to initiation of sitting/lying exercises; ambulation x 6 feet with 90-degree left turn prior to sitting edge of low mat       Neuromuscular Re-education - for increased motor recruitment of major muscle groups with motor impairment, nervous system priming and exercises to promote trunk stability    Supine alternating UE/LE (dead bug technique in hooklying), tactile cue for pt attempting to get 90 deg hip flexion [limited hip flexion AROM on L]; 2x10 alternating    Standing adjacent to raised low mat, in confines of front-wheel walker: [place]quat at edge of table; 2x10 with therapist guarding posteriorly in case of loss of descending control    In // bars:  Standing toe tapping onto 6-inch step to promote active dorsiflexion during LE swing and bilateral weight shift and weight acceptance; x20 bilateral LE   Unilateral upper extremity reaching and grasping in standing position with minimal UE support, with bilateral fine motor task, pulp to pulp grasp for metal nuts, moving  to adjacent container requiring cross-body reaching; x 2 min    *next visit* Standing march with R upper extremity support only on single parallel bar, 2x10 alternating [full clearance of RLE, unweighting of LLE with inconsistent clearance from floor]     *not today* Standing heel-to-toe step over blue line; alternating LE; 2x10 alternating Sitting on purple disc, catching and throwing with bilateral UE with small black basketball (smaller size for increased demand on reaching and grasp accuracy), multi-directional reaching and bilateral grasping; x 5 min  Rolling Theraputty (yellow Theraputty) in standing to facilitate improved trunk control and lower limb support without UE assist and digit/MCP extension; x 2 minutes Unilateral upper extremity  reaching and grasping in standing position with minimal UE support (touching on rail prn if losing balance); fine motor grasping and trunk rotation to place items into container on opposite side of body (small clothes pins, small cylinder and cubes of various sizes, nuts, spheres - from "HAND PT" packet); x 2 min  Seated march on purple disc with alternating UE/LE; x25 alternating Sitting on purple disc, catching balloon and balloon tapping back to therapist - to facilitate active cross body adduction and trunk rotation as needed for rolling and reaching/grasping tasks (including low and high punches reaching today); x 2 minute catching, x 2 minutes balloon tapping         ASSESSMENT Patient is able to progress toe tapping to 6-inch step today, requiring higher degree of combined hip/knee flexion AROM in standing position and increased weight shift to either lower extremity. She is still significantly challenged with lifting left lower limb in lying and standing (i.e. open-chain flexion). She is able to perform rolling to L without assist (therapist standing by for guarding EOB and manage catheter drainage bag only). She is able to prop onto her L arm and move lower limbs off of edge of mat in sidelying, but has difficulty with adequate trunk and LUE force output to move into sitting from sidelying. She is able to perform pulp to pulp grasp with 5 metal nuts consecutively and move into adjacent container. She demonstrates improving independence with sit to stand and requires no assist for stand pivot transfer with FWW from power chair to mat. Discussed simple stretching pt could utilize at home for L lower limb tightness/hypertonicity. Patient has remaining deficits in increased tone of wrist/digit and elbow flexors (L>RUE), decreased L>R shoulder AROM, impaired trunk control, decreased LE strength and motor recruitment, gait deficits/deviations (most notably difficulty with L>R toe clearance during gait  that is more apparent with fatigue), full weight acceptance onto bilateral LE, and impaired postural stability in standing. Patient will benefit from continued skilled therapeutic intervention to address the above deficits as needed for improved function and QoL.       PT Short Term Goals - 02/10/21 1323       PT SHORT TERM GOAL #1   Title Patient will perform stand-pivot transfer from chair to bed with supervision level of assist and ModI setup of power chair as needed for transferring at home    Baseline 11/26/20: Max assist for setup and mod assist to perform sit to stand, unsteady pivot prior to performing stand to sit.    12/23/20: Patient is able to setup power chair for transfer ModI. Mod to Max assist for sit to stand. CGA level of assist for standing pivot and stand to sit on low mat.   01/15/21: Setup of power chair without assist; modified independent pivot and stand-to-sit; [place]  for initial set to stand prior to pivot.  02/10/21: ModI setup of power chair, [place] for initiation of sit to stand, modified-independent stand pivot transfer    Time 4    Period Weeks    Status Partially Met    Target Date 03/03/21      PT SHORT TERM GOAL #2   Title Patient will perform standing toe tap to 3-inch surface (Airex pad) with bilateral upper extremity support indicative of improved ability to clear feet from floor during stepping and improved weight shift to each LE with AD use    Baseline 11/26/20: Limited toe clearance during bilateral LE swing phase with front-wheeled walker.   12/23/20: performed at previous follow-up with bilateral UE support.    Time 3    Period Weeks    Status Achieved    Target Date 12/17/20               PT Long Term Goals - 02/10/21 1325       PT LONG TERM GOAL #1   Title Patient will demonstrate improved function as evidenced by a score of 35 on FOTO measure for full participation in activities at home and in the community.    Baseline 11/26/20: FOTO 12.    12/23/20: FOTO 24.    01/15/21: FOTO 23.   02/10/21: FOTO 38    Time 16    Period Weeks    Status Achieved    Target Date 03/18/21      PT LONG TERM GOAL #2   Title Patient will ambulate for 150 feet without LOB, with sufficient toe clearance to prevent loss of gait stability, and c proper AD placement with least-restrictive assistive device with supervision level of assist    Baseline 11/26/20: Gait x 40 feet with CGA.   12/23/20: CGA with dec toe clearance L>RLE.   01/15/21: Ambulated 160 feet without LOB, proper AD placement; good toe clearance with first 80 feet, but decrased L>R toe clearanc with fatigue.   02/10/21: Performed with FWW with good toe clearance with first 80 feet, decreasing L toe clearance with succesive steps.    Time 12    Period Weeks    Status Partially Met    Target Date 03/03/21      PT LONG TERM GOAL #3   Title Patient will perform independent re-positioning in chair with use of closed-chain triceps on armrests as needed for independent wheelchair mobility with no verbal cueing or tactile cueing required    Baseline 11/26/20: Dependent re-positioning in wheelchair.   12/23/20: Performed with feet on fold-out platform of her power wheelchair IND.    Time 8    Period Weeks    Status Achieved    Target Date 12/23/20      PT LONG TERM GOAL #4   Title Patient will perform simulated step-over task for clearing doorway threshold with use of front-wheeled walker as needed for entering/exiting home with supervision level of assistance and no LOB    Baseline 11/26/20: Pt currently uses power wheelchair to negotiate threshold into home.   12/23/20: Deferred.  01/15/21: Deferred.   02/10/21: Performed last week with step-over single line on floor with negligible height.    Time 16    Period Weeks    Status Partially Met    Target Date 03/18/21                   Plan - 03/04/21 1126     Clinical Impression Statement Patient  is able to progress toe tapping to 6-inch  step today, requiring higher degree of combined hip/knee flexion AROM in standing position and increased weight shift to either lower extremity. She is still significantly challenged with lifting left lower limb in lying and standing (i.e. open-chain flexion). She is able to perform rolling to L without assist (therapist standing by for guarding EOB and manage catheter drainage bag only). She is able to prop onto her L arm and move lower limbs off of edge of mat in sidelying, but has difficulty with adequate trunk and LUE force output to move into sitting from sidelying. She is able to perform pulp to pulp grasp with 5 metal nuts consecutively and move into adjacent container. She demonstrates improving independence with sit to stand and requires no assist for stand pivot transfer with FWW from power chair to mat. Discussed simple stretching pt could utilize at home for L lower limb tightness/hypertonicity. Patient has remaining deficits in increased tone of wrist/digit and elbow flexors (L>RUE), decreased L>R shoulder AROM, impaired trunk control, decreased LE strength and motor recruitment, gait deficits/deviations (most notably difficulty with L>R toe clearance during gait that is more apparent with fatigue), full weight acceptance onto bilateral LE, and impaired postural stability in standing. Patient will benefit from continued skilled therapeutic intervention to address the above deficits as needed for improved function and QoL.    Personal Factors and Comorbidities Age;Comorbidity 3+;Time since onset of injury/illness/exacerbation    Comorbidities GERD, pre-diabetes, obesity, gout, s/p C3-7 ACDF    Examination-Activity Limitations Bathing;Continence;Reach Overhead;Stairs;Bed Mobility;Dressing;Self Feeding;Stand;Hygiene/Grooming;Toileting;Transfers;Locomotion Level    Examination-Participation Restrictions Community Activity;Yard Work;Laundry;Cleaning;Shop    Stability/Clinical Decision Making  Evolving/Moderate complexity    Rehab Potential Fair    PT Frequency Other (comment)   2-3x/week   PT Duration Other (comment)   16 weeks   PT Treatment/Interventions Electrical Stimulation;Gait training;Stair training;Functional mobility training;Therapeutic activities;Therapeutic exercise;Balance training;Neuromuscular re-education;Wheelchair mobility training;Patient/family education    PT Next Visit Plan Gait re-training, task practice for transferring, LE strengthening, UE AROM and strengthening to assist with functional mobility and transfers. Weight-shifting and pre-gait activities with decreasing UE support.    PT Home Exercise Plan Pt continuing with home health exercises. Pt continuing with task practice for rolling, scooting, bridging in bed; walking with CGA with FWW; reaching and grasping with LUE. 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, initial encounter [facility])     Problem List Patient Active Problem List   Diagnosis Date Noted   Generalized anxiety disorder 01/24/2021   Suspected deep tissue injury 10/25/2020   Wheelchair dependence 11/27/2019   Spasticity 11/27/2019   Hyperkalemia    Steroid-induced hyperglycemia    Prediabetes    Neurogenic bowel    Neurogenic bladder    Neuropathic pain    Quadriplegia ([place]) 08/31/2019   S/P cervical spinal fusion 08/28/2019   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 [place], [place], [address] Phone: [phone]   Fax:  [phone]  Name: Meredith Roach MRN: [id number] Date of Birth: 15-May-1940

## 2021-03-05 ENCOUNTER — Encounter: Payer: Self-pay | Admitting: Physical Therapy

## 2021-03-05 ENCOUNTER — Ambulatory Visit: Payer: Medicare PPO | Admitting: Physical Therapy

## 2021-03-05 ENCOUNTER — Other Ambulatory Visit: Payer: Self-pay

## 2021-03-05 DIAGNOSIS — S14123A 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, initial encounter: Secondary | ICD-10-CM

## 2021-03-05 DIAGNOSIS — R262 Difficulty in walking, not elsewhere classified: Secondary | ICD-10-CM | POA: Diagnosis not present

## 2021-03-05 DIAGNOSIS — M6281 Muscle weakness (generalized): Secondary | ICD-10-CM

## 2021-03-05 NOTE — Therapy (Signed)
[facility] [address]. [place], [place], [address] Phone: [phone]   Fax:  [phone]  Physical Therapy Treatment  Patient Details  Name: Meredith Roach MRN: [id number] Date of Birth: 1941-02-21 Referring Provider (PT): [name], MD   Encounter Date: 03/05/2021   PT End of Session - 03/09/21 0829     Visit Number 37    Number of Visits 48    Date for PT Re-Evaluation 03/18/21    Authorization Time Period Cert 77/11/65-79/03/83, last progress note 02/10/21; current auth 11/10-12/29    Progress Note Due on Visit 48    PT Start Time 1153    PT Stop Time 1240    PT Time Calculation (min) 47 min    Equipment Utilized During Treatment Gait belt   Pt uses power wheelchair, clinic's front-wheeled walker utilized for gait   Activity Tolerance Patient tolerated treatment well    Behavior During Therapy WFL for tasks assessed/performed             Past Medical History:  Diagnosis Date   Arthritis    GERD (gastroesophageal reflux disease)    Gout    Neuromuscular disorder ([place])    Pre-diabetes     Past Surgical History:  Procedure Laterality Date   ANTERIOR CERVICAL DECOMPRESSION/DISCECTOMY FUSION 4 LEVELS N/A 08/28/2019   Procedure: ANTERIOR CERVICAL DECOMPRESSION/DISCECTOMY FUSION CERVICALTHREE-FOUR CERVICAL,FOUR-FIVE,CERVICAL FIVE-SIX,CERVICAL SIX-SEVEN.;  Surgeon: [name], MD;  Location: [facility];  Service: Neurosurgery;  Laterality: N/A;  ANTERIOR   BACK SURGERY     CATARACT EXTRACTION W/PHACO Right 03/31/2018   Procedure: CATARACT EXTRACTION PHACO AND INTRAOCULAR LENS PLACEMENT ([place]);  Surgeon: [name], MD;  Location: [facility];  Service: Ophthalmology;  Laterality: Right;  [place] 00:39.7 CDE 4.35 Fluid Pack Lot # [id number]   CHOLECYSTECTOMY  1995   COLONOSCOPY WITH PROPOFOL N/A 06/07/2017   Procedure: COLONOSCOPY WITH PROPOFOL;  Surgeon: [name], MD;  Location: [facility] ENDOSCOPY;  Service:  Endoscopy;  Laterality: N/A;   ESOPHAGOGASTRODUODENOSCOPY (EGD) WITH PROPOFOL N/A 06/07/2017   Procedure: ESOPHAGOGASTRODUODENOSCOPY (EGD) WITH PROPOFOL;  Surgeon: [name], MD;  Location: [facility] ENDOSCOPY;  Service: Endoscopy;  Laterality: N/A;   IR CATHETER TUBE CHANGE  05/10/2020   REDUCTION MAMMAPLASTY Bilateral 1994    There were no vitals filed for this visit.   Subjective Assessment - 03/09/21 0828     Subjective Patient reports feeling better today in regard to L lower limb tightness and pain. She reports continued work on bed mobility and gait at home with FWW. Patient reports working on active dorsiflexion when lying in bed, but she has not performed dorsiflexion stretch with bedsheet - she states she needs to find an appropriate item to assist with this stretch at home.    Patient is accompained by: Family member   husband   Pertinent History Patient is a 80 year old female s/p incomplete SCI. She had incomplete SCI following fall - C4 [place] D (DOI: 08/26/19). Primary activity limitations c difficulty walking, transferring, and completing independent functional mobility following incomplete SCI. She has undergone home health therapy for about one year. She and her husband reports she was able to transfer and ambulate with walker in home with PT walking beside her. Pt has practiced with front-wheel walker at home. 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 Patient reports using steady transfer aid for performing transfers chair to bed and W/C to standard chair. Her husband states that she is able to stand  with steady device and can stand with her legs against the bed. Pt has ramp to get in her home for her power wheelchair. Patient lives in one level home. Pt uses tub bench that she can utilize to slide into shower; her daughter/husband assists with bathing. Pt has [employer] Lei that can accommodate her power chair at this time. Pt has concrete driveway to get up to her ramp. Pt has small  threshold to get into her home - husband utilizes cardboard to get over threshold.    Limitations Walking;Standing;House hold activities;Other (comment)   transferring, bed mobility, bladder management   Patient Stated Goals to be able to walk more independently                  TREATMENT  Manual Therapy -  soft tissue mobility and lower limb soft tissue extensibility, joint mobility, ROM needed for toe clearance during gait  L lower limb PROM within pt tolerance L ankle dorsiflexion stretch; 2x30sec   Therapeutic Activities - patient education, repetitive task practice for improved performance of daily functional activities e.g. transferring, bed mobility    Modified-independent stand-pivot transfer with FWW, power chair to low mat (with 90-degree left turn); stand-by assist   Performance of supine to/from sit with pt able to assume partial sidelying position onto L forearm, dependent with lower limb management to attain lying position Performance of independent scooting on table to move from edge of mat toward middle   Gait with FWW in clinic; SBA/supervision throughout gait [cueing for toe clearance]; 3 laps around gym     Reviewed dorsiflexion stretching with bedsheet in supine; performed for 30 seconds with verbal cueing for holding for full duration of stretch and direction of motion      *not today* Bed mobility task practice: Performed bilateral rolling on low mat with Independent roll to L x 5; roll to R x 5 [MaxA to initiate LUE cross-body shoulder adduction and to facilitate truncal rotation via pelvis, pt able to complete last 50% of roll with Minimal assist] Sit to stand from raised table, 2x10 with table height, 21-inches for height of table Bridging; x25 as needed for scooting and re-positioning pelvis on bed  Sitting edge of bed for partial weightbearing onto bilateral LE, seated march with no UE support; 2x20 with alternating UE/LE  In // bars: Standing march  with UE support on // bars; 2x12 alternating Standing heel raise/toe raise; x20 alternating  Sit to stand and gait to edge of table with modified-independent stand to sit prior to initiation of sitting/lying exercises; ambulation x 6 feet with 90-degree left turn prior to sitting edge of low mat       Neuromuscular Re-education - for increased motor recruitment of major muscle groups with motor impairment, nervous system priming and exercises to promote trunk stability     Supine alternating UE/LE (dead bug technique in hooklying), tactile cue for pt attempting to get 90 deg hip flexion [limited hip flexion AROM on L]; 2x10 alternating     Standing adjacent to raised low mat, in confines of front-wheel walker: [place] at edge of table; 2x10 with therapist guarding posteriorly in case of loss of descending control     In // bars:  Standing toe tapping onto 6-inch step to promote active dorsiflexion during LE swing and bilateral weight shift and weight acceptance; x20 bilateral LE   Hurdle step over 1-lb ankle weight on floor to simulate motor control and coordination demands for clearing threshold or minor change  in surface height; x10 each LE      *next visit* Standing march with R upper extremity support only on single parallel bar, 2x10 alternating [full clearance of RLE, unweighting of LLE with inconsistent clearance from floor]       *not today* Unilateral upper extremity reaching and grasping in standing position with minimal UE support, with bilateral fine motor task, pulp to pulp grasp for metal nuts, moving to adjacent container requiring cross-body reaching; x 2 min Standing heel-to-toe step over blue line; alternating LE; 2x10 alternating Sitting on purple disc, catching and throwing with bilateral UE with small black basketball (smaller size for increased demand on reaching and grasp accuracy), multi-directional reaching and bilateral grasping; x 5 min  Rolling Theraputty  (yellow Theraputty) in standing to facilitate improved trunk control and lower limb support without UE assist and digit/MCP extension; x 2 minutes Unilateral upper extremity reaching and grasping in standing position with minimal UE support (touching on rail prn if losing balance); fine motor grasping and trunk rotation to place items into container on opposite side of body (small clothes pins, small cylinder and cubes of various sizes, nuts, spheres - from "HAND PT" packet); x 2 min  Seated march on purple disc with alternating UE/LE; x25 alternating Sitting on purple disc, catching balloon and balloon tapping back to therapist - to facilitate active cross body adduction and trunk rotation as needed for rolling and reaching/grasping tasks (including low and high punches reaching today); x 2 minute catching, x 2 minutes balloon tapping        ASSESSMENT Patient is able to clear small obstacle on floor (small ankle weight on ground) with intermittent decreased L lower limb clearance, but clearing with RLE with 80% of attempts. Patient does have significant weakness with combined hip/knee flexion in standing, which can affect ability to negotiate change in surface height and uneven terrain. She exhibits independent scooting in lying and stand-pivot transfer at this time. She requires only management of catheter drainage bag during rolling to her L (pt is more limited in rolling from supine to R sidelying as needed for independent bed mobility). Pt has made notable progress to date, but she still exhibits significant L-sided weakness, hypertonicity, and decreased motor drive consistent with incomplete SCI. Patient has remaining deficits in increased tone of wrist/digit and elbow flexors (L>RUE), decreased L>R shoulder AROM, impaired trunk control, decreased LE strength and motor recruitment, gait deficits/deviations (most notably difficulty with L>R toe clearance during gait that is more apparent with fatigue),  full weight acceptance onto bilateral LE, and impaired postural stability in standing. Patient will benefit from continued skilled therapeutic intervention to address the above deficits as needed for improved function and QoL.     PT Short Term Goals - 02/10/21 1323       PT SHORT TERM GOAL #1   Title Patient will perform stand-pivot transfer from chair to bed with supervision level of assist and ModI setup of power chair as needed for transferring at home    Baseline 11/26/20: Max assist for setup and mod assist to perform sit to stand, unsteady pivot prior to performing stand to sit.    12/23/20: Patient is able to setup power chair for transfer ModI. Mod to Max assist for sit to stand. CGA level of assist for standing pivot and stand to sit on low mat.   01/15/21: Setup of power chair without assist; modified independent pivot and stand-to-sit; ModA for initial set to stand prior to pivot.  02/10/21: ModI setup of power chair, [place] for initiation of sit to stand, modified-independent stand pivot transfer    Time 4    Period Weeks    Status Partially Met    Target Date 03/03/21      PT SHORT TERM GOAL #2   Title Patient will perform standing toe tap to 3-inch surface (Airex pad) with bilateral upper extremity support indicative of improved ability to clear feet from floor during stepping and improved weight shift to each LE with AD use    Baseline 11/26/20: Limited toe clearance during bilateral LE swing phase with front-wheeled walker.   12/23/20: performed at previous follow-up with bilateral UE support.    Time 3    Period Weeks    Status Achieved    Target Date 12/17/20               PT Long Term Goals - 02/10/21 1325       PT LONG TERM GOAL #1   Title Patient will demonstrate improved function as evidenced by a score of 35 on FOTO measure for full participation in activities at home and in the community.    Baseline 11/26/20: FOTO 12.   12/23/20: FOTO 24.    01/15/21: FOTO 23.    02/10/21: FOTO 38    Time 16    Period Weeks    Status Achieved    Target Date 03/18/21      PT LONG TERM GOAL #2   Title Patient will ambulate for 150 feet without LOB, with sufficient toe clearance to prevent loss of gait stability, and c proper AD placement with least-restrictive assistive device with supervision level of assist    Baseline 11/26/20: Gait x 40 feet with CGA.   12/23/20: CGA with dec toe clearance L>RLE.   01/15/21: Ambulated 160 feet without LOB, proper AD placement; good toe clearance with first 80 feet, but decrased L>R toe clearanc with fatigue.   02/10/21: Performed with FWW with good toe clearance with first 80 feet, decreasing L toe clearance with succesive steps.    Time 12    Period Weeks    Status Partially Met    Target Date 03/03/21      PT LONG TERM GOAL #3   Title Patient will perform independent re-positioning in chair with use of closed-chain triceps on armrests as needed for independent wheelchair mobility with no verbal cueing or tactile cueing required    Baseline 11/26/20: Dependent re-positioning in wheelchair.   12/23/20: Performed with feet on fold-out platform of her power wheelchair IND.    Time 8    Period Weeks    Status Achieved    Target Date 12/23/20      PT LONG TERM GOAL #4   Title Patient will perform simulated step-over task for clearing doorway threshold with use of front-wheeled walker as needed for entering/exiting home with supervision level of assistance and no LOB    Baseline 11/26/20: Pt currently uses power wheelchair to negotiate threshold into home.   12/23/20: Deferred.  01/15/21: Deferred.   02/10/21: Performed last week with step-over single line on floor with negligible height.    Time 16    Period Weeks    Status Partially Met    Target Date 03/18/21                   Plan - 03/09/21 7673     Clinical Impression Statement Patient is able to clear small obstacle on floor (small  ankle weight on ground) with  intermittent decreased L lower limb clearance, but clearing with RLE with 80% of attempts. Patient does have significant weakness with combined hip/knee flexion in standing, which can affect ability to negotiate change in surface height and uneven terrain. She exhibits independent scooting in lying and stand-pivot transfer at this time. She requires only management of catheter drainage bag during rolling to her L (pt is more limited in rolling from supine to R sidelying as needed for independent bed mobility). Pt has made notable progress to date, but she still exhibits significant L-sided weakness, hypertonicity, and decreased motor drive consistent with incomplete SCI. Patient has remaining deficits in increased tone of wrist/digit and elbow flexors (L>RUE), decreased L>R shoulder AROM, impaired trunk control, decreased LE strength and motor recruitment, gait deficits/deviations (most notably difficulty with L>R toe clearance during gait that is more apparent with fatigue), full weight acceptance onto bilateral LE, and impaired postural stability in standing. Patient will benefit from continued skilled therapeutic intervention to address the above deficits as needed for improved function and QoL.    Personal Factors and Comorbidities Age;Comorbidity 3+;Time since onset of injury/illness/exacerbation    Comorbidities GERD, pre-diabetes, obesity, gout, s/p C3-7 ACDF    Examination-Activity Limitations Bathing;Continence;Reach Overhead;Stairs;Bed Mobility;Dressing;Self Feeding;Stand;Hygiene/Grooming;Toileting;Transfers;Locomotion Level    Examination-Participation Restrictions Community Activity;Yard Work;Laundry;Cleaning;Shop    Stability/Clinical Decision Making Evolving/Moderate complexity    Rehab Potential Fair    PT Frequency Other (comment)   2-3x/week   PT Duration Other (comment)   16 weeks   PT Treatment/Interventions Electrical Stimulation;Gait training;Stair training;Functional mobility  training;Therapeutic activities;Therapeutic exercise;Balance training;Neuromuscular re-education;Wheelchair mobility training;Patient/family education    PT Next Visit Plan Gait re-training, task practice for transferring, LE strengthening, UE AROM and strengthening to assist with functional mobility and transfers. Weight-shifting and pre-gait activities with decreasing UE support.    PT Home Exercise Plan Pt continuing with home health exercises. Pt continuing with task practice for rolling, bridging, supine to sit transfers in bed; walking with supervision/decreasing level fo assist with FWW; reaching and grasping with LUE. 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, initial encounter [facility])     Problem List Patient Active Problem List   Diagnosis Date Noted   Generalized anxiety disorder 01/24/2021   Suspected deep tissue injury 10/25/2020   Wheelchair dependence 11/27/2019   Spasticity 11/27/2019   Hyperkalemia    Steroid-induced hyperglycemia    Prediabetes    Neurogenic bowel    Neurogenic bladder    Neuropathic pain    Quadriplegia ([place]) 08/31/2019   S/P cervical spinal fusion 08/28/2019   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 [address] [place], [place], [address] Phone:  [phone]   Fax:  [phone]  Name: Meredith Roach MRN: [id number] Date of Birth: 03-10-1941

## 2021-03-10 ENCOUNTER — Encounter: Payer: Self-pay | Admitting: Physical Therapy

## 2021-03-10 ENCOUNTER — Ambulatory Visit: Payer: Medicare PPO | Admitting: Physical Therapy

## 2021-03-10 ENCOUNTER — Other Ambulatory Visit: Payer: Self-pay

## 2021-03-10 DIAGNOSIS — M6281 Muscle weakness (generalized): Secondary | ICD-10-CM

## 2021-03-10 DIAGNOSIS — R262 Difficulty in walking, not elsewhere classified: Secondary | ICD-10-CM | POA: Diagnosis not present

## 2021-03-10 DIAGNOSIS — S14123A 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, initial encounter: Secondary | ICD-10-CM

## 2021-03-10 NOTE — Therapy (Signed)
[facility] [address] [place], [place], [address] Phone: [phone]   Fax:  [phone]  Physical Therapy Treatment  Patient Details  Name: Meredith Roach MRN: [id number] Date of Birth: 05-29-1940 Referring Provider (PT): [name], MD   Encounter Date: 03/10/2021   PT End of Session - 03/10/21 1530     Visit Number 38    Number of Visits 48    Date for PT Re-Evaluation 03/18/21    Authorization Time Period Cert 61/44/31-54/00/86, last progress note 02/10/21; current auth 11/10-12/29    Progress Note Due on Visit 5    PT Start Time 1147    PT Stop Time 1235    PT Time Calculation (min) 48 min    Equipment Utilized During Treatment Gait belt   Pt uses power wheelchair, clinic's front-wheeled walker utilized for gait   Activity Tolerance Patient tolerated treatment well    Behavior During Therapy WFL for tasks assessed/performed             Past Medical History:  Diagnosis Date   Arthritis    GERD (gastroesophageal reflux disease)    Gout    Neuromuscular disorder ([place])    Pre-diabetes     Past Surgical History:  Procedure Laterality Date   ANTERIOR CERVICAL DECOMPRESSION/DISCECTOMY FUSION 4 LEVELS N/A 08/28/2019   Procedure: ANTERIOR CERVICAL DECOMPRESSION/DISCECTOMY FUSION CERVICALTHREE-FOUR CERVICAL,FOUR-FIVE,CERVICAL FIVE-SIX,CERVICAL SIX-SEVEN.;  Surgeon: [name], MD;  Location: [facility];  Service: Neurosurgery;  Laterality: N/A;  ANTERIOR   BACK SURGERY     CATARACT EXTRACTION W/PHACO Right 03/31/2018   Procedure: CATARACT EXTRACTION PHACO AND INTRAOCULAR LENS PLACEMENT ([place]);  Surgeon: [name], MD;  Location: [facility];  Service: Ophthalmology;  Laterality: Right;  [place] 00:39.7 CDE 4.35 Fluid Pack Lot # [id number]   CHOLECYSTECTOMY  1995   COLONOSCOPY WITH PROPOFOL N/A 06/07/2017   Procedure: COLONOSCOPY WITH PROPOFOL;  Surgeon: [name], MD;  Location[facility] ENDOSCOPY;  Service:  Endoscopy;  Laterality: N/A;   ESOPHAGOGASTRODUODENOSCOPY (EGD) WITH PROPOFOL N/A 06/07/2017   Procedure: ESOPHAGOGASTRODUODENOSCOPY (EGD) WITH PROPOFOL;  Surgeon: [name], MD;  Location: [facility] ENDOSCOPY;  Service: Endoscopy;  Laterality: N/A;   IR CATHETER TUBE CHANGE  05/10/2020   REDUCTION MAMMAPLASTY Bilateral 1994    There were no vitals filed for this visit.   Subjective Assessment - 03/10/21 1529     Subjective Patient reports continued work on home program including sit to stands and minisquats (with walker against heavy furnutire and seat behind patient) as well as using nuts/bolts and cotton balls for upper limb fine motor grasping work. Patient has done well with maintaining robust home program. She states she is still not as good as she wants to be with bed mobility and gripping/grasping with L hand.    Patient is accompained by: Family member   husband   Pertinent History Patient is a 80 year old female s/p incomplete SCI. She had incomplete SCI following fall - C4 [place] D (DOI: 08/26/19). Primary activity limitations c difficulty walking, transferring, and completing independent functional mobility following incomplete SCI. She has undergone home health therapy for about one year. She and her husband reports she was able to transfer and ambulate with walker in home with PT walking beside her. Pt has practiced with front-wheel walker at home. 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 Patient reports using steady transfer aid for performing transfers chair to bed and W/C to standard chair. Her husband states that she  is able to stand with steady device and can stand with her legs against the bed. Pt has ramp to get in her home for her power wheelchair. Patient lives in one level home. Pt uses tub bench that she can utilize to slide into shower; her daughter/husband assists with bathing. Pt has Meredith Roach that can accommodate her power chair at this time. Pt has concrete driveway to  get up to her ramp. Pt has small threshold to get into her home - husband utilizes cardboard to get over threshold.    Limitations Walking;Standing;House hold activities;Other (comment)   transferring, bed mobility, bladder management   Patient Stated Goals to be able to walk more independently                   TREATMENT   Therapeutic Activities - patient education, repetitive task practice for improved performance of daily functional activities e.g. transferring, bed mobility    Modified-independent stand-pivot transfer with FWW, power chair to low mat (with 90-degree left turn); stand-by assist   Performance of supine to/from sit with pt able to assume partial sidelying position onto L forearm, dependent with lower limb management to attain lying position Performance of independent scooting on table to move from edge of mat toward middle  Rolling with slow return from sidelying to neutral for R rolling, performed x 5; light resistance for L rolling x 5 with intermittent blocking/guarding to avoid lower limbs rolling off of edge of low mat   Gait with FWW in clinic; SBA/supervision throughout gait [cueing for toe clearance]; 1 laps around gym       *Patient has small bleed along R proximal forearm following accidental rubbing of superficial bruise and scab on parallel bar. Cleaned the area and applied bandage for pt.       *not today* Reviewed dorsiflexion stretching with bedsheet in supine; performed for 30 seconds with verbal cueing for holding for full duration of stretch and direction of motion Bed mobility task practice: Performed bilateral rolling on low mat with Independent roll to L x 5; roll to R x 5 [MaxA to initiate LUE cross-body shoulder adduction and to facilitate truncal rotation via pelvis, pt able to complete last 50% of roll with Minimal assist] Sit to stand from raised table, 2x10 with table height, 21-inches for height of table Bridging; x25 as needed for  scooting and re-positioning pelvis on bed  In // bars: Standing heel raise/toe raise; x20 alternating        Neuromuscular Re-education - for increased motor recruitment of major muscle groups with motor impairment, nervous system priming and exercises to promote trunk stability      In // bars:  Standing toe tapping onto 6-inch step to promote active dorsiflexion during LE swing and bilateral weight shift and weight acceptance; x20 bilateral LE   Hurdle step over 1-lb ankle weight on floor to simulate motor control and coordination demands for clearing threshold or minor change in surface height; 1x10 each LE   Standing march with R upper extremity support only on single parallel bar, 1x10 alternating [full clearance of RLE, unweighting of LLE with inconsistent clearance from floor]   Reaching toward sticky-notes on backside of rolling mirror, 3 targets, with no UE support; x 10 with bilateral UE [lower targets for L upper limb]      *not today* Minisquat at edge of table; 2x10 with therapist guarding posteriorly in case of loss of descending control Supine alternating UE/LE (dead bug technique in hooklying),  tactile cue for pt attempting to get 90 deg hip flexion [limited hip flexion AROM on L]; 2x10 alternating Unilateral upper extremity reaching and grasping in standing position with minimal UE support, with bilateral fine motor task, pulp to pulp grasp for metal nuts, moving to adjacent container requiring cross-body reaching; x 2 min Standing heel-to-toe step over blue line; alternating LE; 2x10 alternating Sitting on purple disc, catching and throwing with bilateral UE with small black basketball (smaller size for increased demand on reaching and grasp accuracy), multi-directional reaching and bilateral grasping; x 5 min  Rolling Theraputty (yellow Theraputty) in standing to facilitate improved trunk control and lower limb support without UE assist and digit/MCP extension; x 2  minutes Unilateral upper extremity reaching and grasping in standing position with minimal UE support (touching on rail prn if losing balance); fine motor grasping and trunk rotation to place items into container on opposite side of body (small clothes pins, small cylinder and cubes of various sizes, nuts, spheres - from "HAND PT" packet); x 2 min  Seated march on purple disc with alternating UE/LE; x25 alternating Sitting on purple disc, catching balloon and balloon tapping back to therapist - to facilitate active cross body adduction and trunk rotation as needed for rolling and reaching/grasping tasks (including low and high punches reaching today); x 2 minute catching, x 2 minutes balloon tapping        ASSESSMENT Patient  is significantly challenged with open-chain hip/knee flexion in standing to lift L lower extremity from ground in standing, though she is able to complete brief unipedal stance on left lower limb with bilateral UE support on her walker. She exhibits fair shoulder AROM with RUE; she has significant difficulty reaching beyond shoulder height with L upper limb. Pt demonstrates independent rolling to her L side and notably improving rolling to her R side with only moderate assist required after task practice x 3. Pt is making ongoing improvements with ambulation and ability to perform stepping and small obstacle negotiation; however, she does have notable motor fatigue toward end of her sets that lead to decreased L lower limb clearance from floor. Patient has remaining deficits in increased tone of wrist/digit and elbow flexors (L>RUE), decreased L>R shoulder AROM, impaired trunk control, decreased LE strength and motor recruitment, gait deficits/deviations (most notably difficulty with L>R toe clearance during gait that is more apparent with fatigue), full weight acceptance onto bilateral LE, and impaired postural stability in standing. Patient will benefit from continued skilled  therapeutic intervention to address the above deficits as needed for improved function and QoL.       PT Short Term Goals - 02/10/21 1323       PT SHORT TERM GOAL #1   Title Patient will perform stand-pivot transfer from chair to bed with supervision level of assist and ModI setup of power chair as needed for transferring at home    Baseline 11/26/20: Max assist for setup and mod assist to perform sit to stand, unsteady pivot prior to performing stand to sit.    12/23/20: Patient is able to setup power chair for transfer ModI. Mod to Max assist for sit to stand. CGA level of assist for standing pivot and stand to sit on low mat.   01/15/21: Setup of power chair without assist; modified independent pivot and stand-to-sit; ModA for initial set to stand prior to pivot.  02/10/21: ModI setup of power chair, ModA for initiation of sit to stand, modified-independent stand pivot transfer    Time  4    Period Weeks    Status Partially Met    Target Date 03/03/21      PT SHORT TERM GOAL #2   Title Patient will perform standing toe tap to 3-inch surface (Airex pad) with bilateral upper extremity support indicative of improved ability to clear feet from floor during stepping and improved weight shift to each LE with AD use    Baseline 11/26/20: Limited toe clearance during bilateral LE swing phase with front-wheeled walker.   12/23/20: performed at previous follow-up with bilateral UE support.    Time 3    Period Weeks    Status Achieved    Target Date 12/17/20               PT Long Term Goals - 02/10/21 1325       PT LONG TERM GOAL #1   Title Patient will demonstrate improved function as evidenced by a score of 35 on FOTO measure for full participation in activities at home and in the community.    Baseline 11/26/20: FOTO 12.   12/23/20: FOTO 24.    01/15/21: FOTO 23.   02/10/21: FOTO 38    Time 16    Period Weeks    Status Achieved    Target Date 03/18/21      PT LONG TERM GOAL #2   Title  Patient will ambulate for 150 feet without LOB, with sufficient toe clearance to prevent loss of gait stability, and c proper AD placement with least-restrictive assistive device with supervision level of assist    Baseline 11/26/20: Gait x 40 feet with CGA.   12/23/20: CGA with dec toe clearance L>RLE.   01/15/21: Ambulated 160 feet without LOB, proper AD placement; good toe clearance with first 80 feet, but decrased L>R toe clearanc with fatigue.   02/10/21: Performed with FWW with good toe clearance with first 80 feet, decreasing L toe clearance with succesive steps.    Time 12    Period Weeks    Status Partially Met    Target Date 03/03/21      PT LONG TERM GOAL #3   Title Patient will perform independent re-positioning in chair with use of closed-chain triceps on armrests as needed for independent wheelchair mobility with no verbal cueing or tactile cueing required    Baseline 11/26/20: Dependent re-positioning in wheelchair.   12/23/20: Performed with feet on fold-out platform of her power wheelchair IND.    Time 8    Period Weeks    Status Achieved    Target Date 12/23/20      PT LONG TERM GOAL #4   Title Patient will perform simulated step-over task for clearing doorway threshold with use of front-wheeled walker as needed for entering/exiting home with supervision level of assistance and no LOB    Baseline 11/26/20: Pt currently uses power wheelchair to negotiate threshold into home.   12/23/20: Deferred.  01/15/21: Deferred.   02/10/21: Performed last week with step-over single line on floor with negligible height.    Time 16    Period Weeks    Status Partially Met    Target Date 03/18/21                   Plan - 03/10/21 1542     Clinical Impression Statement Patient  is significantly challenged with open-chain hip/knee flexion in standing to lift L lower extremity from ground in standing, though she is able to complete brief unipedal stance on left  lower limb with bilateral UE  support on her walker. She exhibits fair shoulder AROM with RUE; she has significant difficulty reaching beyond shoulder height with L upper limb. Pt demonstrates independent rolling to her L side and notably improving rolling to her R side with only moderate assist required after task practice x 3. Pt is making ongoing improvements with ambulation and ability to perform stepping and small obstacle negotiation; however, she does have notable motor fatigue toward end of her sets that lead to decreased L lower limb clearance from floor. Patient has remaining deficits in increased tone of wrist/digit and elbow flexors (L>RUE), decreased L>R shoulder AROM, impaired trunk control, decreased LE strength and motor recruitment, gait deficits/deviations (most notably difficulty with L>R toe clearance during gait that is more apparent with fatigue), full weight acceptance onto bilateral LE, and impaired postural stability in standing. Patient will benefit from continued skilled therapeutic intervention to address the above deficits as needed for improved function and QoL.    Personal Factors and Comorbidities Age;Comorbidity 3+;Time since onset of injury/illness/exacerbation    Comorbidities GERD, pre-diabetes, obesity, gout, s/p C3-7 ACDF    Examination-Activity Limitations Bathing;Continence;Reach Overhead;Stairs;Bed Mobility;Dressing;Self Feeding;Stand;Hygiene/Grooming;Toileting;Transfers;Locomotion Level    Examination-Participation Restrictions Community Activity;Yard Work;Laundry;Cleaning;Shop    Stability/Clinical Decision Making Evolving/Moderate complexity    Rehab Potential Fair    PT Frequency Other (comment)   2-3x/week   PT Duration Other (comment)   16 weeks   PT Treatment/Interventions Electrical Stimulation;Gait training;Stair training;Functional mobility training;Therapeutic activities;Therapeutic exercise;Balance training;Neuromuscular re-education;Wheelchair mobility training;Patient/family  education    PT Next Visit Plan Gait re-training, task practice for transferring, LE strengthening, UE AROM and strengthening to assist with functional mobility and transfers. Weight-shifting and pre-gait activities with decreasing UE support.    PT Home Exercise Plan Pt continuing with home health exercises. Pt continuing with task practice for rolling, bridging, supine to sit transfers in bed; walking with supervision/decreasing level fo assist with FWW; reaching and grasping with LUE. 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, initial encounter [facility])     Problem List Patient Active Problem List   Diagnosis Date Noted   Generalized anxiety disorder 01/24/2021   Suspected deep tissue injury 10/25/2020   Wheelchair dependence 11/27/2019   Spasticity 11/27/2019   Hyperkalemia    Steroid-induced hyperglycemia    Prediabetes    Neurogenic bowel    Neurogenic bladder    Neuropathic pain    Quadriplegia ([place]) 08/31/2019   S/P cervical spinal fusion 08/28/2019   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 [place], [place], [address] Phone: [phone]   Fax:  [phone]  Name: ANALIS DISTLER MRN: [id number] Date of Birth: 28-Apr-1940

## 2021-03-12 ENCOUNTER — Encounter: Payer: Self-pay | Admitting: Physical Therapy

## 2021-03-12 ENCOUNTER — Ambulatory Visit: Payer: Medicare PPO | Admitting: Physical Therapy

## 2021-03-12 ENCOUNTER — Other Ambulatory Visit: Payer: Self-pay

## 2021-03-12 DIAGNOSIS — S14123A 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, initial encounter: Secondary | ICD-10-CM

## 2021-03-12 DIAGNOSIS — R262 Difficulty in walking, not elsewhere classified: Secondary | ICD-10-CM

## 2021-03-12 DIAGNOSIS — M6281 Muscle weakness (generalized): Secondary | ICD-10-CM

## 2021-03-12 NOTE — Therapy (Signed)
[facility] [address] [place], [place], [address] Phone: [phone]   Fax:  [phone]  Physical Therapy Treatment  Patient Details  Name: Meredith Roach MRN: [id number] Date of Birth: 20-Aug-1940 Referring Provider (PT): [name], MD   Encounter Date: 03/12/2021   PT End of Session - 03/12/21 2108     Visit Number 39    Number of Visits 48    Date for PT Re-Evaluation 03/18/21    Authorization Time Period Cert 67/61/95-09/32/67, last progress note 02/10/21; current auth 11/10-12/29    Progress Note Due on Visit 74    PT Start Time 1140    PT Stop Time 1230    PT Time Calculation (min) 50 min    Equipment Utilized During Treatment Gait belt   Pt uses power wheelchair, clinic's front-wheeled walker utilized for gait   Activity Tolerance Patient tolerated treatment well    Behavior During Therapy WFL for tasks assessed/performed             Past Medical History:  Diagnosis Date   Arthritis    GERD (gastroesophageal reflux disease)    Gout    Neuromuscular disorder ([place])    Pre-diabetes     Past Surgical History:  Procedure Laterality Date   ANTERIOR CERVICAL DECOMPRESSION/DISCECTOMY FUSION 4 LEVELS N/A 08/28/2019   Procedure: ANTERIOR CERVICAL DECOMPRESSION/DISCECTOMY FUSION CERVICALTHREE-FOUR CERVICAL,FOUR-FIVE,CERVICAL FIVE-SIX,CERVICAL SIX-SEVEN.;  Surgeon: [name], MD;  Location: [facility];  Service: Neurosurgery;  Laterality: N/A;  ANTERIOR   BACK SURGERY     CATARACT EXTRACTION W/PHACO Right 03/31/2018   Procedure: CATARACT EXTRACTION PHACO AND INTRAOCULAR LENS PLACEMENT ([place]);  Surgeon: [name], MD;  Location: [facility];  Service: Ophthalmology;  Laterality: Right;  [place] 00:39.7 CDE 4.35 Fluid Pack Lot # [id number]   CHOLECYSTECTOMY  1995   COLONOSCOPY WITH PROPOFOL N/A 06/07/2017   Procedure: COLONOSCOPY WITH PROPOFOL;  Surgeon: [name], MD;  Location: [facility]COPY;  Service:  Endoscopy;  Laterality: N/A;   ESOPHAGOGASTRODUODENOSCOPY (EGD) WITH PROPOFOL N/A 06/07/2017   Procedure: ESOPHAGOGASTRODUODENOSCOPY (EGD) WITH PROPOFOL;  Surgeon: [name], MD;  Location: [facility] END[facility]COPY;  Service: Endoscopy;  Laterality: N/A;   IR CATHETER TUBE CHANGE  05/10/2020   REDUCTION MAMMAPLASTY Bilateral 1994    There were no vitals filed for this visit.   Subjective Assessment - 03/12/21 1156     Subjective Patient reports tolerating her previous session well and she feels that she has progressed with her walking, transferring, and bed mobility over course of PT. She is still working on upper limb AROM exercises given by home health OT. She feels she is still limited with bed mobility.    Patient is accompained by: Family member   husband   Pertinent History Patient is a 80 year old female s/p incomplete SCI. She had incomplete SCI following fall - C4 [place] D (DOI: 08/26/19). Primary activity limitations c difficulty walking, transferring, and completing independent functional mobility following incomplete SCI. She has undergone home health therapy for about one year. She and her husband reports she was able to transfer and ambulate with walker in home with PT walking beside her. Pt has practiced with front-wheel walker at home. 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 Patient reports using steady transfer aid for performing transfers chair to bed and W/C to standard chair. Her husband states that she is able to stand with steady device and can stand with her legs against the bed. Pt has ramp  to get in her home for her power wheelchair. Patient lives in one level home. Pt uses tub bench that she can utilize to slide into shower; her daughter/husband assists with bathing. Pt has [employer] Lei that can accommodate her power chair at this time. Pt has concrete driveway to get up to her ramp. Pt has small threshold to get into her home - husband utilizes cardboard to get over threshold.     Limitations Walking;Standing;House hold activities;Other (comment)   transferring, bed mobility, bladder management   Patient Stated Goals to be able to walk more independently             TREATMENT   Therapeutic Activities - patient education, repetitive task practice for improved performance of daily functional activities e.g. transferring, bed mobility    Modified-independent stand-pivot transfer with FWW, power chair to low mat (with 90-degree left turn); stand-by assist   Performance of supine to/from sit with pt able to assume partial sidelying position onto L forearm, dependent with lower limb management to attain lying position Performance of independent scooting on table to move from edge of mat toward middle   Rolling with slow return from sidelying to neutral for R rolling, performed x 5; light resistance for L rolling x 5 with intermittent blocking/guarding to avoid lower limbs rolling off of edge of low mat  Lower trunk rotations with bilateral lower limbs in hooklying for trunk control during rolling and re-positioning in bed; 2x12 alternating   Gait with FWW in clinic; SBA/supervision throughout gait [cueing for toe clearance]; 3 laps around gym          *not today* Reviewed dorsiflexion stretching with bedsheet in supine; performed for 30 seconds with verbal cueing for holding for full duration of stretch and direction of motion Bed mobility task practice: Performed bilateral rolling on low mat with Independent roll to L x 5; roll to R x 5 [MaxA to initiate LUE cross-body shoulder adduction and to facilitate truncal rotation via pelvis, pt able to complete last 50% of roll with Minimal assist] Sit to stand from raised table, 2x10 with table height, 21-inches for height of table Bridging; x25 as needed for scooting and re-positioning pelvis on bed  In // bars: Standing heel raise/toe raise; x20 alternating         Neuromuscular Re-education - for increased motor  recruitment of major muscle groups with motor impairment, nervous system priming and exercises to promote trunk stability      In // bars:  Standing toe tapping onto 6-inch step to promote active dorsiflexion during LE swing and bilateral weight shift and weight acceptance; x20 bilateral LE   Hurdle step over 1-lb ankle weight on floor to simulate motor control and coordination demands for clearing threshold or minor change in surface height; 1x10 each LE   Reaching toward sticky-notes on backside of rolling mirror, 3 targets, with no UE support; 2x10 with bilateral UE [lower targets for L upper limb]       *not today* Standing march with R upper extremity support only on single parallel bar, 1x10 alternating [full clearance of RLE, unweighting of LLE with inconsistent clearance from floor] Minisquat at edge of table; 2x10 with therapist guarding posteriorly in case of loss of descending control Supine alternating UE/LE (dead bug technique in hooklying), tactile cue for pt attempting to get 90 deg hip flexion [limited hip flexion AROM on L]; 2x10 alternating Unilateral upper extremity reaching and grasping in standing position with minimal UE support, with bilateral  fine motor task, pulp to pulp grasp for metal nuts, moving to adjacent container requiring cross-body reaching; x 2 min Standing heel-to-toe step over blue line; alternating LE; 2x10 alternating Sitting on purple disc, catching and throwing with bilateral UE with small black basketball (smaller size for increased demand on reaching and grasp accuracy), multi-directional reaching and bilateral grasping; x 5 min  Rolling Theraputty (yellow Theraputty) in standing to facilitate improved trunk control and lower limb support without UE assist and digit/MCP extension; x 2 minutes Unilateral upper extremity reaching and grasping in standing position with minimal UE support (touching on rail prn if losing balance); fine motor grasping and  trunk rotation to place items into container on opposite side of body (small clothes pins, small cylinder and cubes of various sizes, nuts, spheres - from "HAND PT" packet); x 2 min  Seated march on purple disc with alternating UE/LE; x25 alternating Sitting on purple disc, catching balloon and balloon tapping back to therapist - to facilitate active cross body adduction and trunk rotation as needed for rolling and reaching/grasping tasks (including low and high punches reaching today); x 2 minute catching, x 2 minutes balloon tapping       ASSESSMENT Patient demonstrates slow, gradual progress with gait. She is able to demonstrate gait pattern without toe drag of either lower limb, but pt does have motor fatigue of L lower limb that leads to decreased L toe clearance following significant activity in PT. She demonstrates independent rolling to her L side that does worsen intermittently following fatigue (minA required to initiate roll to L following completion of bed mobility exercise). She still requires ModA to initiate rolling to R. Pt is currently modified-independent with stand-pivot transfers with FWW, scooting on mat for re-positioning, and with performing sit to stand from raised table. Patient has remaining deficits in increased tone of wrist/digit and elbow flexors (L>RUE), decreased L>R shoulder AROM, impaired trunk control, decreased LE strength and motor recruitment, gait deficits/deviations (most notably difficulty with L>R toe clearance during gait that is more apparent with fatigue), full weight acceptance onto bilateral LE, and impaired postural stability in standing. Patient will benefit from continued skilled therapeutic intervention to address the above deficits as needed for improved function and QoL.         PT Short Term Goals - 02/10/21 1323       PT SHORT TERM GOAL #1   Title Patient will perform stand-pivot transfer from chair to bed with supervision level of assist and  ModI setup of power chair as needed for transferring at home    Baseline 11/26/20: Max assist for setup and mod assist to perform sit to stand, unsteady pivot prior to performing stand to sit.    12/23/20: Patient is able to setup power chair for transfer ModI. Mod to Max assist for sit to stand. CGA level of assist for standing pivot and stand to sit on low mat.   01/15/21: Setup of power chair without assist; modified independent pivot and stand-to-sit; ModA for initial set to stand prior to pivot.  02/10/21: ModI setup of power chair, ModA for initiation of sit to stand, modified-independent stand pivot transfer    Time 4    Period Weeks    Status Partially Met    Target Date 03/03/21      PT SHORT TERM GOAL #2   Title Patient will perform standing toe tap to 3-inch surface (Airex pad) with bilateral upper extremity support indicative of improved ability to clear feet  from floor during stepping and improved weight shift to each LE with AD use    Baseline 11/26/20: Limited toe clearance during bilateral LE swing phase with front-wheeled walker.   12/23/20: performed at previous follow-up with bilateral UE support.    Time 3    Period Weeks    Status Achieved    Target Date 12/17/20               PT Long Term Goals - 02/10/21 1325       PT LONG TERM GOAL #1   Title Patient will demonstrate improved function as evidenced by a score of 35 on FOTO measure for full participation in activities at home and in the community.    Baseline 11/26/20: FOTO 12.   12/23/20: FOTO 24.    01/15/21: FOTO 23.   02/10/21: FOTO 38    Time 16    Period Weeks    Status Achieved    Target Date 03/18/21      PT LONG TERM GOAL #2   Title Patient will ambulate for 150 feet without LOB, with sufficient toe clearance to prevent loss of gait stability, and c proper AD placement with least-restrictive assistive device with supervision level of assist    Baseline 11/26/20: Gait x 40 feet with CGA.   12/23/20: CGA with dec  toe clearance L>RLE.   01/15/21: Ambulated 160 feet without LOB, proper AD placement; good toe clearance with first 80 feet, but decrased L>R toe clearanc with fatigue.   02/10/21: Performed with FWW with good toe clearance with first 80 feet, decreasing L toe clearance with succesive steps.    Time 12    Period Weeks    Status Partially Met    Target Date 03/03/21      PT LONG TERM GOAL #3   Title Patient will perform independent re-positioning in chair with use of closed-chain triceps on armrests as needed for independent wheelchair mobility with no verbal cueing or tactile cueing required    Baseline 11/26/20: Dependent re-positioning in wheelchair.   12/23/20: Performed with feet on fold-out platform of her power wheelchair IND.    Time 8    Period Weeks    Status Achieved    Target Date 12/23/20      PT LONG TERM GOAL #4   Title Patient will perform simulated step-over task for clearing doorway threshold with use of front-wheeled walker as needed for entering/exiting home with supervision level of assistance and no LOB    Baseline 11/26/20: Pt currently uses power wheelchair to negotiate threshold into home.   12/23/20: Deferred.  01/15/21: Deferred.   02/10/21: Performed last week with step-over single line on floor with negligible height.    Time 16    Period Weeks    Status Partially Met    Target Date 03/18/21                   Plan - 03/12/21 2116     Clinical Impression Statement Patient demonstrates slow, gradual progress with gait. She is able to demonstrate gait pattern without toe drag of either lower limb, but pt does have motor fatigue of L lower limb that leads to decreased L toe clearance following significant activity in PT. She demonstrates independent rolling to her L side that does worsen intermittently following fatigue (minA required to initiate roll to L following completion of bed mobility exercise). She still requires ModA to initiate rolling to R. Pt is  currently modified-independent with  stand-pivot transfers with FWW, scooting on mat for re-positioning, and with performing sit to stand from raised table. Patient has remaining deficits in increased tone of wrist/digit and elbow flexors (L>RUE), decreased L>R shoulder AROM, impaired trunk control, decreased LE strength and motor recruitment, gait deficits/deviations (most notably difficulty with L>R toe clearance during gait that is more apparent with fatigue), full weight acceptance onto bilateral LE, and impaired postural stability in standing. Patient will benefit from continued skilled therapeutic intervention to address the above deficits as needed for improved function and QoL.    Personal Factors and Comorbidities Age;Comorbidity 3+;Time since onset of injury/illness/exacerbation    Comorbidities GERD, pre-diabetes, obesity, gout, s/p C3-7 ACDF    Examination-Activity Limitations Bathing;Continence;Reach Overhead;Stairs;Bed Mobility;Dressing;Self Feeding;Stand;Hygiene/Grooming;Toileting;Transfers;Locomotion Level    Examination-Participation Restrictions Community Activity;Yard Work;Laundry;Cleaning;Shop    Stability/Clinical Decision Making Evolving/Moderate complexity    Rehab Potential Fair    PT Frequency Other (comment)   2-3x/week   PT Duration Other (comment)   16 weeks   PT Treatment/Interventions Electrical Stimulation;Gait training;Stair training;Functional mobility training;Therapeutic activities;Therapeutic exercise;Balance training;Neuromuscular re-education;Wheelchair mobility training;Patient/family education    PT Next Visit Plan Gait re-training, task practice for transferring, LE strengthening, UE AROM and strengthening to assist with functional mobility and transfers. Weight-shifting and pre-gait activities with decreasing UE support.    PT Home Exercise Plan Pt continuing with home health exercises. Pt continuing with task practice for rolling, bridging, supine to sit transfers  in bed; walking with supervision/decreasing level fo assist with FWW; reaching and grasping with LUE. 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, initial encounter [facility])     Problem List Patient Active Problem List   Diagnosis Date Noted   Generalized anxiety disorder 01/24/2021   Suspected deep tissue injury 10/25/2020   Wheelchair dependence 11/27/2019   Spasticity 11/27/2019   Hyperkalemia    Steroid-induced hyperglycemia    Prediabetes    Neurogenic bowel    Neurogenic bladder    Neuropathic pain    Quadriplegia ([place]) 08/31/2019   S/P cervical spinal fusion 08/28/2019   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 [place], [place], [address] Phone: [phone]   Fax:  [phone]  Name: Meredith Roach MRN: [id number] Date of Birth: [birth year]

## 2021-03-17 ENCOUNTER — Encounter: Payer: Self-pay | Admitting: Physical Therapy

## 2021-03-17 ENCOUNTER — Other Ambulatory Visit: Payer: Self-pay

## 2021-03-17 ENCOUNTER — Ambulatory Visit: Payer: Medicare PPO | Attending: Physical Medicine and Rehabilitation | Admitting: Physical Therapy

## 2021-03-17 DIAGNOSIS — M6281 Muscle weakness (generalized): Secondary | ICD-10-CM | POA: Insufficient documentation

## 2021-03-17 DIAGNOSIS — R262 Difficulty in walking, not elsewhere classified: Secondary | ICD-10-CM | POA: Insufficient documentation

## 2021-03-17 DIAGNOSIS — S14123A 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, initial encounter: Secondary | ICD-10-CM | POA: Diagnosis present

## 2021-03-17 NOTE — Therapy (Signed)
[facility] [address] [place], [place], [address] Phone: [phone]   Fax:  [phone]  Physical Therapy Treatment/ Physical Therapy Progress Note   Dates of reporting period  02/10/21   to   03/17/21   Patient Details  Name: Meredith Roach MRN: [id number] Date of Birth: 1940/07/05 Referring Provider (PT): [name], MD   Encounter Date: 03/17/2021   PT End of Session - 03/17/21 1139     Visit Number 40    Number of Visits 52    Date for PT Re-Evaluation 03/18/21    Authorization Time Period Cert 40/34/74-25/95/63, last progress note 03/17/21; current auth 11/10-12/29.    Progress Note Due on Visit 49    PT Start Time 1145    PT Stop Time 1241    PT Time Calculation (min) 56 min    Equipment Utilized During Treatment Gait belt   Pt uses power wheelchair, clinic's front-wheeled walker utilized for gait   Activity Tolerance Patient tolerated treatment well    Behavior During Therapy WFL for tasks assessed/performed             Past Medical History:  Diagnosis Date   Arthritis    GERD (gastroesophageal reflux disease)    Gout    Neuromuscular disorder ([place])    Pre-diabetes     Past Surgical History:  Procedure Laterality Date   ANTERIOR CERVICAL DECOMPRESSION/DISCECTOMY FUSION 4 LEVELS N/A 08/28/2019   Procedure: ANTERIOR CERVICAL DECOMPRESSION/DISCECTOMY FUSION CERVICALTHREE-FOUR CERVICAL,FOUR-FIVE,CERVICAL FIVE-SIX,CERVICAL SIX-SEVEN.;  Surgeon: [name], MD;  Location: [place];  Service: Neurosurgery;  Laterality: N/A;  ANTERIOR   BACK SURGERY     CATARACT EXTRACTION W/PHACO Right 03/31/2018   Procedure: CATARACT EXTRACTION PHACO AND INTRAOCULAR LENS PLACEMENT ([place]);  Surgeon: [name], MD;  Location: [facility];  Service: Ophthalmology;  Laterality: Right;  [place] 00:39.7 CDE 4.35 Fluid Pack Lot # [id number]   CHOLECYSTECTOMY  1995   COLONOSCOPY WITH PROPOFOL N/A 06/07/2017   Procedure: COLONOSCOPY  WITH PROPOFOL;  Surgeon: [name], MD;  Location: [facility]COPY;  Service: Endoscopy;  Laterality: N/A;   ESOPHAGOGASTRODUODENOSCOPY (EGD) WITH PROPOFOL N/A 06/07/2017   Procedure: ESOPHAGOGASTRODUODENOSCOPY (EGD) WITH PROPOFOL;  Surgeon: [name], MD;  Location: [facility]COPY;  Service: Endoscopy;  Laterality: N/A;   IR CATHETER TUBE CHANGE  05/10/2020   REDUCTION MAMMAPLASTY Bilateral 1994    There were no vitals filed for this visit.   Subjective Assessment - 03/17/21 1147     Subjective Patient reports her progress has been good over the previous month. Patient reports she stood up out of her wheelchair independently with her daughter standing by with her walker. Patient reports 75% improvement to date. She reports she wants to be able to negotiate doorway threshold for accessing deck at her daughter's home. Patient reports she wants to see further improvement with use of her L hand. Patient reports she wants improved core strength and improved ability to stand up and walk. Pt is working on fine motor and reaching + grasping tasks at home with foam balls and nuts/bolts. Patient reports she needs help with supine to sit transfers, but she does not need significant help with stand pivot transfers. Patient and her husband voiced today that they would like to transition to patient transferring in/out of passenger's seat of SUV with walker versus using power chair-accessible van.    Patient is accompained by: Family member   husband   Pertinent History Patient is a 80 year old  female s/p incomplete SCI. She had incomplete SCI following fall - C4 [place] D (DOI: 08/26/19). Primary activity limitations c difficulty walking, transferring, and completing independent functional mobility following incomplete SCI. She has undergone home health therapy for about one year. She and her husband reports she was able to transfer and ambulate with walker in home with PT walking beside her. Pt has  practiced with front-wheel walker at home. 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 Patient reports using steady transfer aid for performing transfers chair to bed and W/C to standard chair. Her husband states that she is able to stand with steady device and can stand with her legs against the bed. Pt has ramp to get in her home for her power wheelchair. Patient lives in one level home. Pt uses tub bench that she can utilize to slide into shower; her daughter/husband assists with bathing. Pt has [employer]i that can accommodate her power chair at this time. Pt has concrete driveway to get up to her ramp. Pt has small threshold to get into her home - husband utilizes cardboard to get over threshold.    Limitations Walking;Standing;House hold activities;Other (comment)   transferring, bed mobility, bladder management   Patient Stated Goals to be able to walk more independently               OBJECTIVE      Posture Forward head, rounded shoulders posture in chair, mild thoracic kyphosis, no sacral sitting noted today. Patient volitionally assumes upright posture in sitting edge of low mat and standing without cueing.    Trunk AROM WNL Upper trunk AROM; Lower trunk: Anterior and posterior pelvic tilt WNL, able to clear ischial tuberosity from table bilaterally with hip hiking in sitting, no sitting LOB    Gait Mild forward flexed posture onto FWW with decreased gait velocity, decreased toe clearance L>R as gait further progresses; decreased heel strike L>R and decreased single-limb support time bilaterally   Upper extremity AROM Shoulder Flexion R 117,  L 86 deg  Elbow extension:  R,-16, -30 L Elbow flexion [facility] bilaterally   Strength R/L 4/4- Shoulder flexion 4/4 Shoulder abduction 5-/5- Biceps 4/4 Triceps 4-/4- Hip flexion 5-/5- Hip abduction (seated)  5/5 Hip adduction 4+/4+ Knee extension 4+/4+ Knee flexion 4-/4 Ankle Plantarflexion 4/5 Ankle Dorsiflexion     FUNCTIONAL  TASKS Stand Pivot Transfer: Independent for power chair placement relative to low mat. CGA for sit to stand to FWW with modified technique (calves and posterior LE pushing on chair with bilat UE support on walker to stand); Modified-independent for pivot and stand-to-sit.    Rolling: ModA for active roll to R side after dependent management of lower limbs and dependent LUE cross body adduction. Difficulty with LUE cross-body adduction and use of L lower limb and trunk flexors/lateral flexors to perform R roll. MinA with rolling to L side. Independent with rolling sidelying to supine bilaterally.    Supine to/from sit: IND with moving lower limbs to edge of bed for supine to sit. MaxA for trunk for sidelying to sitting transfer. IND with sitting to supine with exception of MaxA required to manage lower limbs once patient assumes sidelying. Pt is IND with scooting once in hooklying position, but only scoots 2-3 cm at a time.     Dressing simulation with Theraband loop: with Red Theraband tied in loop, patient pulled band around bilateral feet and up to her thighs. MaxA for clearing band around heels on either side during donning and  doffing; pt uses RUE only given limited L upper limb ROM and decreased L hand fine motor control         TREATMENT    Therapeutic Activities - patient education, repetitive task practice for improved performance of daily functional activities e.g. transferring, bed mobility    Re-assessment performed (see above and updated Goal section below)   Performance of supine to/from sit with pt able to assume partial sidelying position onto L forearm, dependent with lower limb management to attain lying position Performance of independent scooting on table to move from edge of mat toward middle  Gait with FWW in clinic; SBA/supervision throughout gait [cueing for toe clearance]; 3 laps around gym    Dressing simulation with Theraband loop (see above); x 4 minutes  Doorway  threshold negotiation x 1 (exiting gym through side door of clinic), with FWW and intermittent CGA during clearance of threshold for safety   *next visit* Lower trunk rotations with bilateral lower limbs in hooklying for trunk control during rolling and re-positioning in bed; 2x12 alternating      *not today* Reviewed dorsiflexion stretching with bedsheet in supine; performed for 30 seconds with verbal cueing for holding for full duration of stretch and direction of motion Bed mobility task practice: Performed bilateral rolling on low mat with Independent roll to L x 5; roll to R x 5 [MaxA to initiate LUE cross-body shoulder adduction and to facilitate truncal rotation via pelvis, pt able to complete last 50% of roll with Minimal assist] Sit to stand from raised table, 2x10 with table height, 21-inches for height of table Bridging; x25 as needed for scooting and re-positioning pelvis on bed  In // bars: Standing heel raise/toe raise; x20 alternating         Neuromuscular Re-education - for increased motor recruitment of major muscle groups with motor impairment, nervous system priming and exercises to promote trunk stability    *next visit* In // bars:  Standing toe tapping onto 6-inch step to promote active dorsiflexion during LE swing and bilateral weight shift and weight acceptance; x20 bilateral LE Hurdle step over 1-lb ankle weight on floor to simulate motor control and coordination demands for clearing threshold or minor change in surface height; 1x10 each LE Reaching toward sticky-notes on backside of rolling mirror, 3 targets, with no UE support; 2x10 with bilateral UE [lower targets for L upper limb]       *not today* Standing march with R upper extremity support only on single parallel bar, 1x10 alternating [full clearance of RLE, unweighting of LLE with inconsistent clearance from floor] Minisquat at edge of table; 2x10 with therapist guarding posteriorly in case of loss of  descending control Supine alternating UE/LE (dead bug technique in hooklying), tactile cue for pt attempting to get 90 deg hip flexion [limited hip flexion AROM on L]; 2x10 alternating Unilateral upper extremity reaching and grasping in standing position with minimal UE support, with bilateral fine motor task, pulp to pulp grasp for metal nuts, moving to adjacent container requiring cross-body reaching; x 2 min Standing heel-to-toe step over blue line; alternating LE; 2x10 alternating  Seated march on purple disc with alternating UE/LE; x25 alternating Sitting on purple disc, catching balloon and balloon tapping back to therapist - to facilitate active cross body adduction and trunk rotation as needed for rolling and reaching/grasping tasks (including low and high punches reaching today); x 2 minute catching, x 2 minutes balloon tapping        ASSESSMENT Patient has  slowing progress with gait quality with patient exhibiting decreased L>R toe clearance during gait trials - though this can be result of motor fatigue prior to initiation of gait today. She has surpassed long-term FOTO score. She has improved bilateral hip flexor strength and excellent L ankle dorsiflexion strength. She has been able to perform L rolling (supine to L sidelying) with supervision/CGA only, but she does require Min assist today to initiate roll to L side. Patient performs independent scooting in lying with relatively small translation with reach attempt (2-3 cm), but she is able to independently scoot on low mat. She exhibits significantly improved independence with stand-pivot transfer - she only requires CGA during sit to stand. Pt is able to clear doorway threshold today simulating demands of stepping onto her porch. She is significantly challenged with dressing simulation today, but she does not lose sitting balance during forward trunk flexion required to complete task. She does have remaining abdominal musculature weakness  (consistent with central cord syndrome) as exhibited by difficulty with supine to sit transfer and difficulty with rolling. She has ongoing L>R upper limb decreased AROM and difficulty with fine motor grip with LUE (though this has vastly improved with patient gripping successively smaller items with variable textures). Patient has remaining deficits in increased tone of wrist/digit and elbow flexors (L>RUE), decreased L>R shoulder AROM, impaired trunk control, decreased LE strength and motor recruitment, gait deficits/deviations (most notably difficulty with L>R toe clearance during gait that is more apparent with fatigue), full weight acceptance onto bilateral LE, impaired postural stability in standing, and bed mobility/bed transfer limitations. Patient will benefit from continued skilled therapeutic intervention to address the above deficits as needed for improved function and QoL.     PT Short Term Goals - 03/17/21 1354       PT SHORT TERM GOAL #1   Title Patient will perform stand-pivot transfer from chair to bed with supervision level of assist and ModI setup of power chair as needed for transferring at home    Baseline 11/26/20: Max assist for setup and mod assist to perform sit to stand, unsteady pivot prior to performing stand to sit.    12/23/20: Patient is able to setup power chair for transfer ModI. Mod to Max assist for sit to stand. CGA level of assist for standing pivot and stand to sit on low mat.   01/15/21: Setup of power chair without assist; modified independent pivot and stand-to-sit; ModA for initial set to stand prior to pivot.  02/10/21: ModI setup of power chair, ModA for initiation of sit to stand, modified-independent stand pivot transfer.     03/17/21: Performed with CGA during sit to stand only and supervision level of assist for remainder of stand-pivot transfer (performing from power chair to table)    Time 4    Period Weeks    Status Partially Met    Target Date 04/07/21       PT SHORT TERM GOAL #2   Title Patient will perform standing toe tap to 3-inch surface (Airex pad) with bilateral upper extremity support indicative of improved ability to clear feet from floor during stepping and improved weight shift to each LE with AD use    Baseline 11/26/20: Limited toe clearance during bilateral LE swing phase with front-wheeled walker.   12/23/20: performed at previous follow-up with bilateral UE support.    Time 3    Period Weeks    Status Achieved    Target Date 12/17/20  PT Long Term Goals - 03/17/21 1156       PT LONG TERM GOAL #1   Title Patient will demonstrate improved function as evidenced by a score of 35 on FOTO measure for full participation in activities at home and in the community.    Baseline 11/26/20: FOTO 12.   12/23/20: FOTO 24.    01/15/21: FOTO 23.   02/10/21: FOTO 38.   03/17/21: 49    Time 16    Period Weeks    Status Achieved    Target Date 03/18/21      PT LONG TERM GOAL #2   Title Patient will ambulate for 150 feet without LOB, with sufficient toe clearance to prevent loss of gait stability, and c proper AD placement with least-restrictive assistive device with supervision level of assist    Baseline 11/26/20: Gait x 40 feet with CGA.   12/23/20: CGA with dec toe clearance L>RLE.   01/15/21: Ambulated 160 feet without LOB, proper AD placement; good toe clearance with first 80 feet, but decrased L>R toe clearanc with fatigue.   02/10/21: Performed with FWW with good toe clearance with first 80 feet, decreasing L toe clearance with succesive steps.   03/17/21: Performed without LOB, no significant instability with bilateral UE support on front-wheel walker; decreased toe clearance during gait L>R.    Time 12    Period Weeks    Status Partially Met    Target Date 04/10/21      PT LONG TERM GOAL #3   Title Patient will perform independent re-positioning in chair with use of closed-chain triceps on armrests as needed for independent  wheelchair mobility with no verbal cueing or tactile cueing required    Baseline 11/26/20: Dependent re-positioning in wheelchair.   12/23/20: Performed with feet on fold-out platform of her power wheelchair IND.    Time 8    Period Weeks    Status Achieved    Target Date 12/23/20      PT LONG TERM GOAL #4   Title Patient will perform simulated step-over task for clearing doorway threshold with use of front-wheeled walker as needed for entering/exiting home with supervision level of assistance and no LOB    Baseline 11/26/20: Pt currently uses power wheelchair to negotiate threshold into home.   12/23/20: Deferred.  01/15/21: Deferred.   02/10/21: Performed last week with step-over single line on floor with negligible height.    03/17/21: Performed over doorway threshold in clinic today with intermittent CGA and no LOB.    Time 16    Period Weeks    Status Partially Met    Target Date 04/10/21                   Plan - 03/17/21 1518     Clinical Impression Statement Patient has slowing progress with gait quality with patient exhibiting decreased L>R toe clearance during gait trials - though this can be result of motor fatigue prior to initiation of gait today. She has surpassed long-term FOTO score. She has improved bilateral hip flexor strength and excellent L ankle dorsiflexion strength. She has been able to perform L rolling (supine to L sidelying) with supervision/CGA only, but she does require Min assist today to initiate roll to L side. Patient performs independent scooting in lying with relatively small translation with reach attempt (2-3 cm), but she is able to independently scoot on low mat. She exhibits significantly improved independence with stand-pivot transfer - she only requires CGA during  sit to stand. Pt is able to clear doorway threshold today simulating demands of stepping onto her porch. She is significantly challenged with dressing simulation today, but she does not lose  sitting balance during forward trunk flexion required to complete task. She does have remaining abdominal musculature weakness (consistent with central cord syndrome) as exhibited by difficulty with supine to sit transfer and difficulty with rolling. She has ongoing L>R upper limb decreased AROM and difficulty with fine motor grip with LUE (though this has vastly improved with patient gripping successively smaller items with variable textures). Patient has remaining deficits in increased tone of wrist/digit and elbow flexors (L>RUE), decreased L>R shoulder AROM, impaired trunk control, decreased LE strength and motor recruitment, gait deficits/deviations (most notably difficulty with L>R toe clearance during gait that is more apparent with fatigue), full weight acceptance onto bilateral LE, impaired postural stability in standing, and bed mobility/bed transfer limitations. Patient will benefit from continued skilled therapeutic intervention to address the above deficits as needed for improved function and QoL.    Personal Factors and Comorbidities Age;Comorbidity 3+;Time since onset of injury/illness/exacerbation    Comorbidities GERD, pre-diabetes, obesity, gout, s/p C3-7 ACDF    Examination-Activity Limitations Bathing;Continence;Reach Overhead;Stairs;Bed Mobility;Dressing;Self Feeding;Stand;Hygiene/Grooming;Toileting;Transfers;Locomotion Level    Examination-Participation Restrictions Community Activity;Yard Work;Laundry;Cleaning;Shop    Stability/Clinical Decision Making Evolving/Moderate complexity    Rehab Potential Fair    PT Frequency Other (comment)   2-3x/week   PT Duration Other (comment)   16 weeks   PT Treatment/Interventions Electrical Stimulation;Gait training;Stair training;Functional mobility training;Therapeutic activities;Therapeutic exercise;Balance training;Neuromuscular re-education;Wheelchair mobility training;Patient/family education    PT Next Visit Plan Gait re-training, task  practice for transferring, LE strengthening, UE AROM and strengthening to assist with functional mobility and transfers. Weight-shifting and pre-gait activities with decreasing UE support.    PT Home Exercise Plan Pt continuing with home health exercises. Pt continuing with task practice for rolling, bridging, supine to sit transfers in bed; walking with supervision/decreasing level fo assist with FWW; reaching and grasping with LUE. 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, initial encounter [facility])     Problem List Patient Active Problem List   Diagnosis Date Noted   Generalized anxiety disorder 01/24/2021   Suspected deep tissue injury 10/25/2020   Wheelchair dependence 11/27/2019   Spasticity 11/27/2019   Hyperkalemia    Steroid-induced hyperglycemia    Prediabetes    Neurogenic bowel    Neurogenic bladder    Neuropathic pain    Quadriplegia ([place]) 08/31/2019   S/P cervical spinal fusion 08/28/2019   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 [place], [place], [address] Phone: [phone]   Fax:  [phone]  Name: Meredith Roach MRN: [id number] Date of Birth: 1940/09/01

## 2021-03-19 ENCOUNTER — Ambulatory Visit: Payer: Medicare PPO | Admitting: Physical Therapy

## 2021-03-19 ENCOUNTER — Encounter: Payer: Self-pay | Admitting: Physical Therapy

## 2021-03-19 ENCOUNTER — Other Ambulatory Visit: Payer: Self-pay

## 2021-03-19 DIAGNOSIS — S14123A 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, initial encounter: Secondary | ICD-10-CM

## 2021-03-19 DIAGNOSIS — R262 Difficulty in walking, not elsewhere classified: Secondary | ICD-10-CM | POA: Diagnosis not present

## 2021-03-19 DIAGNOSIS — M6281 Muscle weakness (generalized): Secondary | ICD-10-CM

## 2021-03-19 NOTE — Therapy (Signed)
[facility] [address] [place], [place], [address] Phone: [phone]   Fax:  [phone]  Physical Therapy Treatment  Patient Details  Name: Meredith Roach MRN: [id number] Date of Birth: 09-28-1940 Referring Provider (PT): [name], MD   Encounter Date: 03/19/2021   PT End of Session - 03/20/21 0909     Visit Number 41    Number of Visits 52    Date for PT Re-Evaluation 03/18/21    Authorization Time Period Cert 89/16/94-50/38/88, last progress note 03/17/21; current auth 11/10-12/29.    Progress Note Due on Visit 91    PT Start Time 1130    PT Stop Time 1225    PT Time Calculation (min) 55 min    Equipment Utilized During Treatment Gait belt   Pt uses power wheelchair, clinic's front-wheeled walker utilized for gait   Activity Tolerance Patient tolerated treatment well    Behavior During Therapy WFL for tasks assessed/performed             Past Medical History:  Diagnosis Date   Arthritis    GERD (gastroesophageal reflux disease)    Gout    Neuromuscular disorder ([place])    Pre-diabetes     Past Surgical History:  Procedure Laterality Date   ANTERIOR CERVICAL DECOMPRESSION/DISCECTOMY FUSION 4 LEVELS N/A 08/28/2019   Procedure: ANTERIOR CERVICAL DECOMPRESSION/DISCECTOMY FUSION CERVICALTHREE-FOUR CERVICAL,FOUR-FIVE,CERVICAL FIVE-SIX,CERVICAL SIX-SEVEN.;  Surgeon: [name], MD;  Location: [place];  Service: Neurosurgery;  Laterality: N/A;  ANTERIOR   BACK SURGERY     CATARACT EXTRACTION W/PHACO Right 03/31/2018   Procedure: CATARACT EXTRACTION PHACO AND INTRAOCULAR LENS PLACEMENT ([place]);  Surgeon: [name], MD;  Location: [facility];  Service: Ophthalmology;  Laterality: Right;  [place] 00:39.7 CDE 4.35 Fluid Pack Lot # [id number]   CHOLECYSTECTOMY  1995   COLONOSCOPY WITH PROPOFOL N/A 06/07/2017   Procedure: COLONOSCOPY WITH PROPOFOL;  Surgeon: [name], MD;  Location[facility] ENDOSCOPY;  Service:  Endoscopy;  Laterality: N/A;   ESOPHAGOGASTRODUODENOSCOPY (EGD) WITH PROPOFOL N/A 06/07/2017   Procedure: ESOPHAGOGASTRODUODENOSCOPY (EGD) WITH PROPOFOL;  Surgeon: [name], MD;  Location: [facility] ENDOSCOPY;  Service: Endoscopy;  Laterality: N/A;   IR CATHETER TUBE CHANGE  05/10/2020   REDUCTION MAMMAPLASTY Bilateral 1994    There were no vitals filed for this visit.   Subjective Assessment - 03/19/21 1145     Subjective Patient reports tolerating last session well. She reports feeling well and voices no new complaints at arrival. She reports ongoing assistance required for supine to sitand rolling in her bed. Pt is compliant with HEP.    Patient is accompained by: Family member   husband   Pertinent History Patient is a 80 year old female s/p incomplete SCI. She had incomplete SCI following fall - C4 [place] D (DOI: 08/26/19). Primary activity limitations c difficulty walking, transferring, and completing independent functional mobility following incomplete SCI. She has undergone home health therapy for about one year. She and her husband reports she was able to transfer and ambulate with walker in home with PT walking beside her. Pt has practiced with front-wheel walker at home. 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 Patient reports using steady transfer aid for performing transfers chair to bed and W/C to standard chair. Her husband states that she is able to stand with steady device and can stand with her legs against the bed. Pt has ramp to get in her home for her power wheelchair. Patient lives in one  level home. Pt uses tub bench that she can utilize to slide into shower; her daughter/husband assists with bathing. Pt has [employer] Lei that can accommodate her power chair at this time. Pt has concrete driveway to get up to her ramp. Pt has small threshold to get into her home - husband utilizes cardboard to get over threshold.    Limitations Walking;Standing;House hold activities;Other  (comment)   transferring, bed mobility, bladder management   Patient Stated Goals to be able to walk more independently                  TREATMENT     Therapeutic Activities - patient education, repetitive task practice for improved performance of daily functional activities e.g. transferring, bed mobility   Car transfer simulation with corner of low mat, attempted negotiation of 6-inch hurdle. Removed hurdle and practiced negotiation of corner of table simulating demands of moving lower limbs into motor vehicle floorboard   Performance of supine to/from sit with pt able to assume partial sidelying position onto L forearm, dependent with lower limb management to attain lying position Performance of independent scooting on table to move from edge of mat toward middle   Gait with FWW in clinic; SBA/supervision throughout gait [cueing for toe clearance]; 2 laps around gym     Lower trunk rotations with bilateral lower limbs in hooklying for trunk control during rolling and re-positioning in bed; 2x12 alternating   Seated toe tapping to Airex pad; x25 alternating to simulate demands of LE management/seated hip and knee flexion needed for dressing/self-care and car transfer     *not today* Doorway threshold negotiation x 1 (exiting gym through side door of clinic), with FWW and intermittent CGA during clearance of threshold for safety Dressing simulation with Theraband loop (see above); x 4 minutes Reviewed dorsiflexion stretching with bedsheet in supine; performed for 30 seconds with verbal cueing for holding for full duration of stretch and direction of motion Bed mobility task practice: Performed bilateral rolling on low mat with Independent roll to L x 5; roll to R x 5 [MaxA to initiate LUE cross-body shoulder adduction and to facilitate truncal rotation via pelvis, pt able to complete last 50% of roll with Minimal assist] Sit to stand from raised table, 2x10 with table height,  21-inches for height of table Bridging; x25 as needed for scooting and re-positioning pelvis on bed  In // bars: Standing heel raise/toe raise; x20 alternating         Neuromuscular Re-education - for increased motor recruitment of major muscle groups with motor impairment, nervous system priming and exercises to promote trunk stability     Supine alternating UE/LE (dead bug technique in hooklying), tactile cue for pt attempting to get 90 deg hip flexion [limited hip flexion AROM on L]; 1x13, 1x12 alternating  *next visit* Combined hip flexion and abduction, alternating limbs   In // bars:  Standing toe tapping onto 6-inch step to promote active dorsiflexion during LE swing and bilateral weight shift and weight acceptance; x20 bilateral LE  Standing reaching and grasping with marbles and white/yellow balls, grasping and placing into adjacent container on raised tray table; x 4 minutes (emphasis on LUE, minimal deficit with reaching/grasping RUE)   *next visit* Hurdle step over 1-lb ankle weight on floor to simulate motor control and coordination demands for clearing threshold or minor change in surface height; 1x10 each LE     *not today* Reaching toward sticky-notes on backside of rolling mirror, 3 targets, with no  UE support; 2x10 with bilateral UE [lower targets for L upper limb] Standing march with R upper extremity support only on single parallel bar, 1x10 alternating [full clearance of RLE, unweighting of LLE with inconsistent clearance from floor] Minisquat at edge of table; 2x10 with therapist guarding posteriorly in case of loss of descending control Unilateral upper extremity reaching and grasping in standing position with minimal UE support, with bilateral fine motor task, pulp to pulp grasp for metal nuts, moving to adjacent container requiring cross-body reaching; x 2 min Standing heel-to-toe step over blue line; alternating LE; 2x10 alternating  Seated march on purple  disc with alternating UE/LE; x25 alternating Sitting on purple disc, catching balloon and balloon tapping back to therapist - to facilitate active cross body adduction and trunk rotation as needed for rolling and reaching/grasping tasks (including low and high punches reaching today); x 2 minute catching, x 2 minutes balloon tapping         ASSESSMENT Patient demonstrates sound toe clearance during gait performed in clinic today. She is able to perform pulp-to-pulp and spherical grip to grab marbles and small balls in tray table. She does have initial difficulty with MCP extension of digits to grab small balls initially, but this is improved following self-stretching into digit extension for L digits with opposite UE. She is able to negotiate corner of low mat (seated on one edge, scooting around corner on adjacent edge with 90-degree turn) partially simulating demands of car transfer. She has significant limitation with getting lower limbs over hurdle. Encouraged continue work on active combined hip/knee flexion in lying and sitting at home as needed for lower extremity management for transfers. She has ongoing L>R upper limb decreased AROM and difficulty with fine motor grip with LUE (though this has vastly improved with patient gripping successively smaller items with variable textures). Patient has remaining deficits in increased tone of wrist/digit and elbow flexors (L>RUE), decreased L>R shoulder AROM, impaired trunk control, decreased LE strength and motor recruitment, gait deficits/deviations (most notably difficulty with L>R toe clearance during gait that is more apparent with fatigue), full weight acceptance onto bilateral LE, impaired postural stability in standing, and bed mobility/bed transfer limitations. Patient will benefit from continued skilled therapeutic intervention to address the above deficits as needed for improved function and QoL.        PT Short Term Goals - 03/17/21 1354        PT SHORT TERM GOAL #1   Title Patient will perform stand-pivot transfer from chair to bed with supervision level of assist and ModI setup of power chair as needed for transferring at home    Baseline 11/26/20: Max assist for setup and mod assist to perform sit to stand, unsteady pivot prior to performing stand to sit.    12/23/20: Patient is able to setup power chair for transfer ModI. Mod to Max assist for sit to stand. CGA level of assist for standing pivot and stand to sit on low mat.   01/15/21: Setup of power chair without assist; modified independent pivot and stand-to-sit; ModA for initial set to stand prior to pivot.  02/10/21: ModI setup of power chair, ModA for initiation of sit to stand, modified-independent stand pivot transfer.     03/17/21: Performed with CGA during sit to stand only and supervision level of assist for remainder of stand-pivot transfer (performing from power chair to table)    Time 4    Period Weeks    Status Partially Met    Target  Date 04/07/21      PT SHORT TERM GOAL #2   Title Patient will perform standing toe tap to 3-inch surface (Airex pad) with bilateral upper extremity support indicative of improved ability to clear feet from floor during stepping and improved weight shift to each LE with AD use    Baseline 11/26/20: Limited toe clearance during bilateral LE swing phase with front-wheeled walker.   12/23/20: performed at previous follow-up with bilateral UE support.    Time 3    Period Weeks    Status Achieved    Target Date 12/17/20               PT Long Term Goals - 03/17/21 1156       PT LONG TERM GOAL #1   Title Patient will demonstrate improved function as evidenced by a score of 35 on FOTO measure for full participation in activities at home and in the community.    Baseline 11/26/20: FOTO 12.   12/23/20: FOTO 24.    01/15/21: FOTO 23.   02/10/21: FOTO 38.   03/17/21: 49    Time 16    Period Weeks    Status Achieved    Target Date 03/18/21       PT LONG TERM GOAL #2   Title Patient will ambulate for 150 feet without LOB, with sufficient toe clearance to prevent loss of gait stability, and c proper AD placement with least-restrictive assistive device with supervision level of assist    Baseline 11/26/20: Gait x 40 feet with CGA.   12/23/20: CGA with dec toe clearance L>RLE.   01/15/21: Ambulated 160 feet without LOB, proper AD placement; good toe clearance with first 80 feet, but decrased L>R toe clearanc with fatigue.   02/10/21: Performed with FWW with good toe clearance with first 80 feet, decreasing L toe clearance with succesive steps.   03/17/21: Performed without LOB, no significant instability with bilateral UE support on front-wheel walker; decreased toe clearance during gait L>R.    Time 12    Period Weeks    Status Partially Met    Target Date 04/10/21      PT LONG TERM GOAL #3   Title Patient will perform independent re-positioning in chair with use of closed-chain triceps on armrests as needed for independent wheelchair mobility with no verbal cueing or tactile cueing required    Baseline 11/26/20: Dependent re-positioning in wheelchair.   12/23/20: Performed with feet on fold-out platform of her power wheelchair IND.    Time 8    Period Weeks    Status Achieved    Target Date 12/23/20      PT LONG TERM GOAL #4   Title Patient will perform simulated step-over task for clearing doorway threshold with use of front-wheeled walker as needed for entering/exiting home with supervision level of assistance and no LOB    Baseline 11/26/20: Pt currently uses power wheelchair to negotiate threshold into home.   12/23/20: Deferred.  01/15/21: Deferred.   02/10/21: Performed last week with step-over single line on floor with negligible height.    03/17/21: Performed over doorway threshold in clinic today with intermittent CGA and no LOB.    Time 16    Period Weeks    Status Partially Met    Target Date 04/10/21                   Plan  - 03/20/21 0931     Clinical Impression Statement Patient demonstrates sound  toe clearance during gait performed in clinic today. She is able to perform pulp-to-pulp and spherical grip to grab marbles and small balls in tray table. She does have initial difficulty with MCP extension of digits to grab small balls initially, but this is improved following self-stretching into digit extension for L digits with opposite UE. She is able to negotiate corner of low mat (seated on one edge, scooting around corner on adjacent edge with 90-degree turn) partially simulating demands of car transfer. She has significant limitation with getting lower limbs over hurdle. Encouraged continue work on active combined hip/knee flexion in lying and sitting at home as needed for lower extremity management for transfers. She has ongoing L>R upper limb decreased AROM and difficulty with fine motor grip with LUE (though this has vastly improved with patient gripping successively smaller items with variable textures). Patient has remaining deficits in increased tone of wrist/digit and elbow flexors (L>RUE), decreased L>R shoulder AROM, impaired trunk control, decreased LE strength and motor recruitment, gait deficits/deviations (most notably difficulty with L>R toe clearance during gait that is more apparent with fatigue), full weight acceptance onto bilateral LE, impaired postural stability in standing, and bed mobility/bed transfer limitations. Patient will benefit from continued skilled therapeutic intervention to address the above deficits as needed for improved function and QoL.    Personal Factors and Comorbidities Age;Comorbidity 3+;Time since onset of injury/illness/exacerbation    Comorbidities GERD, pre-diabetes, obesity, gout, s/p C3-7 ACDF    Examination-Activity Limitations Bathing;Continence;Reach Overhead;Stairs;Bed Mobility;Dressing;Self Feeding;Stand;Hygiene/Grooming;Toileting;Transfers;Locomotion Level     Examination-Participation Restrictions Community Activity;Yard Work;Laundry;Cleaning;Shop    Stability/Clinical Decision Making Evolving/Moderate complexity    Rehab Potential Fair    PT Frequency Other (comment)   2-3x/week   PT Duration Other (comment)   16 weeks   PT Treatment/Interventions Electrical Stimulation;Gait training;Stair training;Functional mobility training;Therapeutic activities;Therapeutic exercise;Balance training;Neuromuscular re-education;Wheelchair mobility training;Patient/family education    PT Next Visit Plan Gait re-training, task practice for transferring, LE strengthening, UE AROM and strengthening to assist with functional mobility and transfers. Weight-shifting and pre-gait activities with decreasing UE support.    PT Home Exercise Plan Pt continuing with home health exercises. Pt continuing with task practice for rolling, bridging, supine to sit transfers in bed; walking with supervision/decreasing level fo assist with FWW; reaching and grasping with LUE. 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, initial encounter [facility])     Problem List Patient Active Problem List   Diagnosis Date Noted   Generalized anxiety disorder 01/24/2021   Suspected deep tissue injury 10/25/2020   Wheelchair dependence 11/27/2019   Spasticity 11/27/2019   Hyperkalemia    Steroid-induced hyperglycemia    Prediabetes    Neurogenic bowel    Neurogenic bladder    Neuropathic pain     Quadriplegia ([place]) 08/31/2019   S/P cervical spinal fusion 08/28/2019   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 [place], [place], [address] Phone: [phone]   Fax:  [phone]  Name: Meredith Roach MRN: [id number] Date of Birth: 1940-06-04

## 2021-03-23 NOTE — Progress Notes (Signed)
Suprapubic Cath Change  Patient is present today for a suprapubic catheter change due to urinary retention.  8 ml of water was drained from the balloon, a 16 FR foley cath was removed from the tract with out difficulty.  Site was cleaned and prepped in a sterile fashion with betadine.  A 16 FR foley cath was replaced into the tract no complications were noted.  Gauze placed on site.  Urine return was noted, 10 ml of sterile water was inflated into the balloon and a night bag was attached for drainage.  Patient tolerated well.     Performed by: Zara Council, PA-C   Follow up: one month for SPT exchange

## 2021-03-24 ENCOUNTER — Ambulatory Visit: Payer: Medicare PPO | Admitting: Physical Therapy

## 2021-03-24 ENCOUNTER — Encounter: Payer: Self-pay | Admitting: Physical Therapy

## 2021-03-24 ENCOUNTER — Ambulatory Visit: Payer: Medicare PPO | Admitting: Urology

## 2021-03-24 ENCOUNTER — Encounter: Payer: Self-pay | Admitting: Urology

## 2021-03-24 ENCOUNTER — Other Ambulatory Visit: Payer: Self-pay

## 2021-03-24 VITALS — BP 115/73 | HR 86 | Ht 63.0 in | Wt 200.0 lb

## 2021-03-24 DIAGNOSIS — S14123A 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, initial encounter: Secondary | ICD-10-CM

## 2021-03-24 DIAGNOSIS — Z9359 Other cystostomy status: Secondary | ICD-10-CM

## 2021-03-24 DIAGNOSIS — M6281 Muscle weakness (generalized): Secondary | ICD-10-CM

## 2021-03-24 DIAGNOSIS — R262 Difficulty in walking, not elsewhere classified: Secondary | ICD-10-CM | POA: Diagnosis not present

## 2021-03-24 DIAGNOSIS — R339 Retention of urine, unspecified: Secondary | ICD-10-CM

## 2021-03-24 NOTE — Therapy (Signed)
[facility] [address] [place], [place], [address] Phone: [phone]   Fax:  [phone]  Physical Therapy Treatment  Patient Details  Name: Meredith Roach MRN: [id number] Date of Birth: 07/16/1940 Referring Provider (PT): [name], MD   Encounter Date: 03/24/2021   PT End of Session - 03/24/21 1200     Visit Number 42    Number of Visits 52    Date for PT Re-Evaluation 03/18/21    Authorization Time Period Cert 37/85/88-50/27/74, last progress note 03/17/21; current auth 11/10-12/29.    Progress Note Due on Visit 41    PT Start Time 1150    PT Stop Time 1235    PT Time Calculation (min) 45 min    Equipment Utilized During Treatment Gait belt   Pt uses power wheelchair, clinic's front-wheeled walker utilized for gait   Activity Tolerance Patient tolerated treatment well    Behavior During Therapy WFL for tasks assessed/performed             Past Medical History:  Diagnosis Date   Arthritis    GERD (gastroesophageal reflux disease)    Gout    Neuromuscular disorder ([place])    Pre-diabetes     Past Surgical History:  Procedure Laterality Date   ANTERIOR CERVICAL DECOMPRESSION/DISCECTOMY FUSION 4 LEVELS N/A 08/28/2019   Procedure: ANTERIOR CERVICAL DECOMPRESSION/DISCECTOMY FUSION CERVICALTHREE-FOUR CERVICAL,FOUR-FIVE,CERVICAL FIVE-SIX,CERVICAL SIX-SEVEN.;  Surgeon: [name], MD;  Location: [place];  Service: Neurosurgery;  Laterality: N/A;  ANTERIOR   BACK SURGERY     CATARACT EXTRACTION W/PHACO Right 03/31/2018   Procedure: CATARACT EXTRACTION PHACO AND INTRAOCULAR LENS PLACEMENT ([place]);  Surgeon: [name], MD;  Location: [facility];  Service: Ophthalmology;  Laterality: Right;  [place] 00:39.7 CDE 4.35 Fluid Pack Lot # [id number]   CHOLECYSTECTOMY  1995   COLONOSCOPY WITH PROPOFOL N/A 06/07/2017   Procedure: COLONOSCOPY WITH PROPOFOL;  Surgeon: [name], MD;  Location: [facility] END[facility]COPY;  Service:  Endoscopy;  Laterality: N/A;   ESOPHAGOGASTRODUODENOSCOPY (EGD) WITH PROPOFOL N/A 06/07/2017   Procedure: ESOPHAGOGASTRODUODENOSCOPY (EGD) WITH PROPOFOL;  Surgeon: [name], MD;  Location: [facility]COPY;  Service: Endoscopy;  Laterality: N/A;   IR CATHETER TUBE CHANGE  05/10/2020   REDUCTION MAMMAPLASTY Bilateral 1994    There were no vitals filed for this visit.   Subjective Assessment - 03/24/21 1151     Subjective Patient reports feeling "tight" along her L arm at arrrival to PT. Patient reports working on L upper limb AROM to "limber it up." Patient reports working on bed mobility with as little assist from her spouse/family support as possible. Patient reports completing regular walking with her FWW at home.    Patient is accompained by: Family member   husband   Pertinent History Patient is a 80 year old female s/p incomplete SCI. She had incomplete SCI following fall - C4 [place] D (DOI: 08/26/19). Primary activity limitations c difficulty walking, transferring, and completing independent functional mobility following incomplete SCI. She has undergone home health therapy for about one year. She and her husband reports she was able to transfer and ambulate with walker in home with PT walking beside her. Pt has practiced with front-wheel walker at home. 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 Patient reports using steady transfer aid for performing transfers chair to bed and W/C to standard chair. Her husband states that she is able to stand with steady device and can stand with her legs against the bed. Pt  has ramp to get in her home for her power wheelchair. Patient lives in one level home. Pt uses tub bench that she can utilize to slide into shower; her daughter/husband assists with bathing. Pt has [employer]i that can accommodate her power chair at this time. Pt has concrete driveway to get up to her ramp. Pt has small threshold to get into her home - husband utilizes cardboard to get  over threshold.    Limitations Walking;Standing;House hold activities;Other (comment)   transferring, bed mobility, bladder management   Patient Stated Goals to be able to walk more independently                TREATMENT     Therapeutic Activities - patient education, repetitive task practice for improved performance of daily functional activities e.g. transferring, bed mobility    Car transfer simulation with lifting lower limbs over 2-inch bar; 4x over and back    Performance of supine to/from sit with pt able to assume partial sidelying position onto L forearm, dependent with lower limb management to attain lying position Performance of independent scooting on table to move from edge of mat toward middle   Lower trunk rotations with bilateral lower limbs in hooklying  with dowel hold c bilat UE; for trunk control during rolling and re-positioning in bed; 2x12 alternating  Stand-pivot transfer from power chair to low mat (and back at end of session) with CGA only during sit to stand to prevent LOB posteriorly     *not today* Seated toe tapping to Airex pad; x25 alternating to simulate demands of LE management/seated hip and knee flexion needed for dressing/self-care and car transfer Gait with FWW in clinic; SBA/supervision throughout gait [cueing for toe clearance]; 2 laps around gym  Doorway threshold negotiation x 1 (exiting gym through side door of clinic), with FWW and intermittent CGA during clearance of threshold for safety Dressing simulation with Theraband loop (see above); x 4 minutes Reviewed dorsiflexion stretching with bedsheet in supine; performed for 30 seconds with verbal cueing for holding for full duration of stretch and direction of motion Bed mobility task practice: Performed bilateral rolling on low mat with Independent roll to L x 5; roll to R x 5 [MaxA to initiate LUE cross-body shoulder adduction and to facilitate truncal rotation via pelvis, pt able to  complete last 50% of roll with Minimal assist] Sit to stand from raised table, 2x10 with table height, 21-inches for height of table Bridging; x25 as needed for scooting and re-positioning pelvis on bed  In // bars: Standing heel raise/toe raise; x20 alternating         Neuromuscular Re-education - for increased motor recruitment of major muscle groups with motor impairment, nervous system priming and exercises to promote trunk stability      Supine alternating knee extension from hooklying position; 2x10 alternating     In // bars:  Hurdle step over 1-lb ankle weight on floor to simulate motor control and coordination demands for clearing threshold or minor change in surface height; 2x10 each LE   Standing reaching and grasping with marbles and white/yellow balls, grasping and placing into adjacent container on raised tray table; x 4 minutes (emphasis on LUE, minimal deficit with reaching/grasping RUE)       *not today* Standing toe tapping onto 6-inch step to promote active dorsiflexion during LE swing and bilateral weight shift and weight acceptance; x20 bilateral LE Reaching toward sticky-notes on backside of rolling mirror, 3 targets, with no UE support;  2x10 with bilateral UE [lower targets for L upper limb] Standing march with R upper extremity support only on single parallel bar, 1x10 alternating [full clearance of RLE, unweighting of LLE with inconsistent clearance from floor] Minisquat at edge of table; 2x10 with therapist guarding posteriorly in case of loss of descending control Unilateral upper extremity reaching and grasping in standing position with minimal UE support, with bilateral fine motor task, pulp to pulp grasp for metal nuts, moving to adjacent container requiring cross-body reaching; x 2 min Standing heel-to-toe step over blue line; alternating LE; 2x10 alternating  Seated march on purple disc with alternating UE/LE; x25 alternating Sitting on purple disc,  catching balloon and balloon tapping back to therapist - to facilitate active cross body adduction and trunk rotation as needed for rolling and reaching/grasping tasks (including low and high punches reaching today); x 2 minute catching, x 2 minutes balloon tapping         ASSESSMENT Patient has made excellent progress to date with performance of stand-pivot transfers, scooting in sitting and lying positions, rolling, and performance of supine to sit. Pt does still require assist for initiating rolling (more assist needed for roll to R) and she is dependent with lower limb management during supine to sit once lower limbs are off of edge of mat. She is able to complete 5 successive reaches and grasps with marbles indicative of improving fine motor control; pt does have difficulty secondary to upper limb sensory deficits. She is able to clear 2-inch bar in sitting simulating partial demand of moving lower limbs over opening for car door when seated in car seat. Patient has remaining deficits in increased tone of wrist/digit and elbow flexors (L>RUE), decreased L>R shoulder AROM, impaired trunk control, decreased LE strength and motor recruitment, gait deficits/deviations (most notably difficulty with L>R toe clearance during gait that is more apparent with fatigue), full weight acceptance onto bilateral LE, impaired postural stability in standing, and bed mobility/bed transfer limitations. Patient will benefit from continued skilled therapeutic intervention to address the above deficits as needed for improved function and QoL.       PT Short Term Goals - 03/17/21 1354       PT SHORT TERM GOAL #1   Title Patient will perform stand-pivot transfer from chair to bed with supervision level of assist and ModI setup of power chair as needed for transferring at home    Baseline 11/26/20: Max assist for setup and mod assist to perform sit to stand, unsteady pivot prior to performing stand to sit.    12/23/20:  Patient is able to setup power chair for transfer ModI. Mod to Max assist for sit to stand. CGA level of assist for standing pivot and stand to sit on low mat.   01/15/21: Setup of power chair without assist; modified independent pivot and stand-to-sit; ModA for initial set to stand prior to pivot.  02/10/21: ModI setup of power chair, ModA for initiation of sit to stand, modified-independent stand pivot transfer.     03/17/21: Performed with CGA during sit to stand only and supervision level of assist for remainder of stand-pivot transfer (performing from power chair to table)    Time 4    Period Weeks    Status Partially Met    Target Date 04/07/21      PT SHORT TERM GOAL #2   Title Patient will perform standing toe tap to 3-inch surface (Airex pad) with bilateral upper extremity support indicative of improved ability to clear  feet from floor during stepping and improved weight shift to each LE with AD use    Baseline 11/26/20: Limited toe clearance during bilateral LE swing phase with front-wheeled walker.   12/23/20: performed at previous follow-up with bilateral UE support.    Time 3    Period Weeks    Status Achieved    Target Date 12/17/20               PT Long Term Goals - 03/17/21 1156       PT LONG TERM GOAL #1   Title Patient will demonstrate improved function as evidenced by a score of 35 on FOTO measure for full participation in activities at home and in the community.    Baseline 11/26/20: FOTO 12.   12/23/20: FOTO 24.    01/15/21: FOTO 23.   02/10/21: FOTO 38.   03/17/21: 49    Time 16    Period Weeks    Status Achieved    Target Date 03/18/21      PT LONG TERM GOAL #2   Title Patient will ambulate for 150 feet without LOB, with sufficient toe clearance to prevent loss of gait stability, and c proper AD placement with least-restrictive assistive device with supervision level of assist    Baseline 11/26/20: Gait x 40 feet with CGA.   12/23/20: CGA with dec toe clearance L>RLE.    01/15/21: Ambulated 160 feet without LOB, proper AD placement; good toe clearance with first 80 feet, but decrased L>R toe clearanc with fatigue.   02/10/21: Performed with FWW with good toe clearance with first 80 feet, decreasing L toe clearance with succesive steps.   03/17/21: Performed without LOB, no significant instability with bilateral UE support on front-wheel walker; decreased toe clearance during gait L>R.    Time 12    Period Weeks    Status Partially Met    Target Date 04/10/21      PT LONG TERM GOAL #3   Title Patient will perform independent re-positioning in chair with use of closed-chain triceps on armrests as needed for independent wheelchair mobility with no verbal cueing or tactile cueing required    Baseline 11/26/20: Dependent re-positioning in wheelchair.   12/23/20: Performed with feet on fold-out platform of her power wheelchair IND.    Time 8    Period Weeks    Status Achieved    Target Date 12/23/20      PT LONG TERM GOAL #4   Title Patient will perform simulated step-over task for clearing doorway threshold with use of front-wheeled walker as needed for entering/exiting home with supervision level of assistance and no LOB    Baseline 11/26/20: Pt currently uses power wheelchair to negotiate threshold into home.   12/23/20: Deferred.  01/15/21: Deferred.   02/10/21: Performed last week with step-over single line on floor with negligible height.    03/17/21: Performed over doorway threshold in clinic today with intermittent CGA and no LOB.    Time 16    Period Weeks    Status Partially Met    Target Date 04/10/21                   Plan - 03/24/21 1346     Clinical Impression Statement Patient has made excellent progress to date with performance of stand-pivot transfers, scooting in sitting and lying positions, rolling, and performance of supine to sit. Pt does still require assist for initiating rolling (more assist needed for roll to R) and  she is dependent with  lower limb management during supine to sit once lower limbs are off of edge of mat. She is able to complete 5 successive reaches and grasps with marbles indicative of improving fine motor control; pt does have difficulty secondary to upper limb sensory deficits. She is able to clear 2-inch bar in sitting simulating partial demand of moving lower limbs over opening for car door when seated in car seat. Patient has remaining deficits in increased tone of wrist/digit and elbow flexors (L>RUE), decreased L>R shoulder AROM, impaired trunk control, decreased LE strength and motor recruitment, gait deficits/deviations (most notably difficulty with L>R toe clearance during gait that is more apparent with fatigue), full weight acceptance onto bilateral LE, impaired postural stability in standing, and bed mobility/bed transfer limitations. Patient will benefit from continued skilled therapeutic intervention to address the above deficits as needed for improved function and QoL.    Personal Factors and Comorbidities Age;Comorbidity 3+;Time since onset of injury/illness/exacerbation    Comorbidities GERD, pre-diabetes, obesity, gout, s/p C3-7 ACDF    Examination-Activity Limitations Bathing;Continence;Reach Overhead;Stairs;Bed Mobility;Dressing;Self Feeding;Stand;Hygiene/Grooming;Toileting;Transfers;Locomotion Level    Examination-Participation Restrictions Community Activity;Yard Work;Laundry;Cleaning;Shop    Stability/Clinical Decision Making Evolving/Moderate complexity    Rehab Potential Fair    PT Frequency Other (comment)   2-3x/week   PT Duration Other (comment)   16 weeks   PT Treatment/Interventions Electrical Stimulation;Gait training;Stair training;Functional mobility training;Therapeutic activities;Therapeutic exercise;Balance training;Neuromuscular re-education;Wheelchair mobility training;Patient/family education    PT Next Visit Plan Gait re-training, task practice for transferring, LE strengthening, UE  AROM and strengthening to assist with functional mobility and transfers. Weight-shifting and pre-gait activities with decreasing UE support.    PT Home Exercise Plan Pt continuing with home health exercises. Pt continuing with task practice for rolling, bridging, supine to sit transfers in bed; walking with supervision/decreasing level fo assist with FWW; reaching and grasping with LUE. 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, initial encounter [facility])     Problem List Patient Active Problem List   Diagnosis Date Noted   Generalized anxiety disorder 01/24/2021   Suspected deep tissue injury 10/25/2020   Wheelchair dependence 11/27/2019   Spasticity 11/27/2019   Hyperkalemia    Steroid-induced hyperglycemia    Prediabetes    Neurogenic bowel    Neurogenic bladder    Neuropathic pain    Quadriplegia ([place]) 08/31/2019   S/P cervical spinal fusion 08/28/2019   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 [place], [place], [address] Phone: [phone]   Fax:  [phone]  Name: Meredith Roach MRN: [id number] Date of Birth: 1941/03/14

## 2021-03-26 ENCOUNTER — Encounter: Payer: Self-pay | Admitting: Physical Therapy

## 2021-03-26 ENCOUNTER — Other Ambulatory Visit: Payer: Self-pay

## 2021-03-26 ENCOUNTER — Ambulatory Visit: Payer: Medicare PPO | Admitting: Physical Therapy

## 2021-03-26 DIAGNOSIS — R262 Difficulty in walking, not elsewhere classified: Secondary | ICD-10-CM

## 2021-03-26 DIAGNOSIS — S14123A 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, initial encounter: Secondary | ICD-10-CM

## 2021-03-26 DIAGNOSIS — M6281 Muscle weakness (generalized): Secondary | ICD-10-CM

## 2021-03-26 NOTE — Therapy (Signed)
[facility] [address] [place], [place], [address] Phone: [phone]   Fax:  [phone]  Physical Therapy Treatment  Patient Details  Name: Meredith Roach MRN: [id number] Date of Birth: May 11, 1940 Referring Provider (PT): [name], MD   Encounter Date: 03/26/2021   PT End of Session - 03/27/21 0726     Visit Number 43    Number of Visits 52    Date for PT Re-Evaluation 03/18/21    Authorization Time Period Cert 67/61/95-09/32/67, last progress note 03/17/21; current auth 11/10-12/29.    Progress Note Due on Visit 30    PT Start Time 1408    PT Stop Time 1500    PT Time Calculation (min) 52 min    Equipment Utilized During Treatment Gait belt   Pt uses power wheelchair, clinic's front-wheeled walker utilized for gait   Activity Tolerance Patient tolerated treatment well    Behavior During Therapy WFL for tasks assessed/performed             Past Medical History:  Diagnosis Date   Arthritis    GERD (gastroesophageal reflux disease)    Gout    Neuromuscular disorder ([place])    Pre-diabetes     Past Surgical History:  Procedure Laterality Date   ANTERIOR CERVICAL DECOMPRESSION/DISCECTOMY FUSION 4 LEVELS N/A 08/28/2019   Procedure: ANTERIOR CERVICAL DECOMPRESSION/DISCECTOMY FUSION CERVICALTHREE-FOUR CERVICAL,FOUR-FIVE,CERVICAL FIVE-SIX,CERVICAL SIX-SEVEN.;  Surgeon: [name], MD;  Location: [facility];  Service: Neurosurgery;  Laterality: N/A;  ANTERIOR   BACK SURGERY     CATARACT EXTRACTION W/PHACO Right 03/31/2018   Procedure: CATARACT EXTRACTION PHACO AND INTRAOCULAR LENS PLACEMENT ([place]);  Surgeon: [name], MD;  Location: [facility];  Service: Ophthalmology;  Laterality: Right;  [place] 00:39.7 CDE 4.35 Fluid Pack Lot # [id number]   CHOLECYSTECTOMY  1995   COLONOSCOPY WITH PROPOFOL N/A 06/07/2017   Procedure: COLONOSCOPY WITH PROPOFOL;  Surgeon: [name], MD;  Location: [facility] END[facility]COPY;  Service:  Endoscopy;  Laterality: N/A;   ESOPHAGOGASTRODUODENOSCOPY (EGD) WITH PROPOFOL N/A 06/07/2017   Procedure: ESOPHAGOGASTRODUODENOSCOPY (EGD) WITH PROPOFOL;  Surgeon: [name], MD;  Location: [facility] ENDOSCOPY;  Service: Endoscopy;  Laterality: N/A;   IR CATHETER TUBE CHANGE  05/10/2020   REDUCTION MAMMAPLASTY Bilateral 1994    There were no vitals filed for this visit.   Subjective Assessment - 03/26/21 1412     Subjective Patient reports L lower limb is not feeling as tight at arrival to PT. Patient reports no major issues at arrival to PT. Patient reports feeling generally well at arrival to PT.    Patient is accompained by: Family member   husband   Pertinent History Patient is a 80 year old female s/p incomplete SCI. She had incomplete SCI following fall - C4 [place] D (DOI: 08/26/19). Primary activity limitations c difficulty walking, transferring, and completing independent functional mobility following incomplete SCI. She has undergone home health therapy for about one year. She and her husband reports she was able to transfer and ambulate with walker in home with PT walking beside her. Pt has practiced with front-wheel walker at home. 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 Patient reports using steady transfer aid for performing transfers chair to bed and W/C to standard chair. Her husband states that she is able to stand with steady device and can stand with her legs against the bed. Pt has ramp to get in her home for her power wheelchair. Patient lives in one level home. Pt  uses tub bench that she can utilize to slide into shower; her daughter/husband assists with bathing. Pt has [employer] Lei that can accommodate her power chair at this time. Pt has concrete driveway to get up to her ramp. Pt has small threshold to get into her home - husband utilizes cardboard to get over threshold.    Limitations Walking;Standing;House hold activities;Other (comment)   transferring, bed mobility,  bladder management   Patient Stated Goals to be able to walk more independently                TREATMENT     Therapeutic Activities - patient education, repetitive task practice for improved performance of daily functional activities e.g. transferring, bed mobility    Car transfer simulation with lifting lower limbs over two stacked 2-inch bars, edge of low mat; 4x over and back Car transfer simulation with lifting lower limbs over 4-inch step (single bar versus double side-by-side bars on step), edge of low mat; 2x over and back     Performance of supine to/from sit with pt able to assume partial sidelying position onto L forearm, dependent with lower limb management to attain lying position Performance of independent scooting on table to move from edge of mat toward middle   Lower trunk rotations with bilateral lower limbs in hooklying  with dowel hold c bilat UE; for trunk control during rolling and re-positioning in bed; 2x12 alternating   Stand-pivot transfer from power chair to low mat (and back at end of session), performed with CGA posteriorly only  Sit to stand from raised table, 2x10 with table height, 21-inches for height of table   Gait with FWW in clinic; SBA/supervision throughout gait [cueing for toe clearance]; 2 laps around gym     *not today* Seated toe tapping to Airex pad; x25 alternating to simulate demands of LE management/seated hip and knee flexion needed for dressing/self-care and car transfer Doorway threshold negotiation x 1 (exiting gym through side door of clinic), with FWW and intermittent CGA during clearance of threshold for safety Dressing simulation with Theraband loop (see above); x 4 minutes Reviewed dorsiflexion stretching with bedsheet in supine; performed for 30 seconds with verbal cueing for holding for full duration of stretch and direction of motion Bed mobility task practice: Performed bilateral rolling on low mat with Independent roll to  L x 5; roll to R x 5 [MaxA to initiate LUE cross-body shoulder adduction and to facilitate truncal rotation via pelvis, pt able to complete last 50% of roll with Minimal assist]  Bridging; x25 as needed for scooting and re-positioning pelvis on bed  In // bars: Standing heel raise/toe raise; x20 alternating         Neuromuscular Re-education - for increased motor recruitment of major muscle groups with motor impairment, nervous system priming and exercises to promote trunk stability      Adjacent to low mat with pt holding FWW:  Hurdle step over 2-lb ankle weight on floor to simulate motor control and coordination demands for clearing threshold or minor change in surface height; 2x10 each LE   Standing reaching and grasping with marbles and white/yellow balls, grasping and placing into adjacent container on raised tray table; x 4 minutes (emphasis on LUE, minimal deficit with reaching/grasping RUE)      *next visit* Supine alternating knee extension from hooklying position; 2x10 alternating     *not today* Standing toe tapping onto 6-inch step to promote active dorsiflexion during LE swing and bilateral weight shift and  weight acceptance; x20 bilateral LE Reaching toward sticky-notes on backside of rolling mirror, 3 targets, with no UE support; 2x10 with bilateral UE [lower targets for L upper limb] Standing march with R upper extremity support only on single parallel bar, 1x10 alternating [full clearance of RLE, unweighting of LLE with inconsistent clearance from floor] [place]quat at edge of table; 2x10 with therapist guarding posteriorly in case of loss of descending control Unilateral upper extremity reaching and grasping in standing position with minimal UE support, with bilateral fine motor task, pulp to pulp grasp for metal nuts, moving to adjacent container requiring cross-body reaching; x 2 min Standing heel-to-toe step over blue line; alternating LE; 2x10 alternating  Seated  march on purple disc with alternating UE/LE; x25 alternating Sitting on purple disc, catching balloon and balloon tapping back to therapist - to facilitate active cross body adduction and trunk rotation as needed for rolling and reaching/grasping tasks (including low and high punches reaching today); x 2 minute catching, x 2 minutes balloon tapping         ASSESSMENT Patient exhibits improved performance of clearing obstacle with bilateral lower limbs in sitting, simulating demands of moving lower limbs into car seat/floorboard from sitting position. She demonstrates improved ability to clear obstacle on floor simulating clearing change in surface height on ground and demand of clearing doorway threshold. Patient exhibits ongoing difficulty with fine motor grasping with L upper limb. She is minimally challenged with fine pulp-to-pulp gripping with R hand. Patient has remaining deficits in increased tone of wrist/digit and elbow flexors (L>RUE), decreased L>R shoulder AROM, impaired trunk control, decreased LE strength and motor recruitment, gait deficits/deviations (most notably difficulty with L>R toe clearance during gait that is more apparent with fatigue), full weight acceptance onto bilateral LE, impaired postural stability in standing, and bed mobility/bed transfer limitations. Patient will benefit from continued skilled therapeutic intervention to address the above deficits as needed for improved function and QoL.       PT Short Term Goals - 03/17/21 1354       PT SHORT TERM GOAL #1   Title Patient will perform stand-pivot transfer from chair to bed with supervision level of assist and ModI setup of power chair as needed for transferring at home    Baseline 11/26/20: Max assist for setup and mod assist to perform sit to stand, unsteady pivot prior to performing stand to sit.    12/23/20: Patient is able to setup power chair for transfer ModI. Mod to Max assist for sit to stand. CGA level of  assist for standing pivot and stand to sit on low mat.   01/15/21: Setup of power chair without assist; modified independent pivot and stand-to-sit; ModA for initial set to stand prior to pivot.  02/10/21: ModI setup of power chair, ModA for initiation of sit to stand, modified-independent stand pivot transfer.     03/17/21: Performed with CGA during sit to stand only and supervision level of assist for remainder of stand-pivot transfer (performing from power chair to table)    Time 4    Period Weeks    Status Partially Met    Target Date 04/07/21      PT SHORT TERM GOAL #2   Title Patient will perform standing toe tap to 3-inch surface (Airex pad) with bilateral upper extremity support indicative of improved ability to clear feet from floor during stepping and improved weight shift to each LE with AD use    Baseline 11/26/20: Limited toe clearance during bilateral  LE swing phase with front-wheeled walker.   12/23/20: performed at previous follow-up with bilateral UE support.    Time 3    Period Weeks    Status Achieved    Target Date 12/17/20               PT Long Term Goals - 03/17/21 1156       PT LONG TERM GOAL #1   Title Patient will demonstrate improved function as evidenced by a score of 35 on FOTO measure for full participation in activities at home and in the community.    Baseline 11/26/20: FOTO 12.   12/23/20: FOTO 24.    01/15/21: FOTO 23.   02/10/21: FOTO 38.   03/17/21: 49    Time 16    Period Weeks    Status Achieved    Target Date 03/18/21      PT LONG TERM GOAL #2   Title Patient will ambulate for 150 feet without LOB, with sufficient toe clearance to prevent loss of gait stability, and c proper AD placement with least-restrictive assistive device with supervision level of assist    Baseline 11/26/20: Gait x 40 feet with CGA.   12/23/20: CGA with dec toe clearance L>RLE.   01/15/21: Ambulated 160 feet without LOB, proper AD placement; good toe clearance with first 80 feet, but  decrased L>R toe clearanc with fatigue.   02/10/21: Performed with FWW with good toe clearance with first 80 feet, decreasing L toe clearance with succesive steps.   03/17/21: Performed without LOB, no significant instability with bilateral UE support on front-wheel walker; decreased toe clearance during gait L>R.    Time 12    Period Weeks    Status Partially Met    Target Date 04/10/21      PT LONG TERM GOAL #3   Title Patient will perform independent re-positioning in chair with use of closed-chain triceps on armrests as needed for independent wheelchair mobility with no verbal cueing or tactile cueing required    Baseline 11/26/20: Dependent re-positioning in wheelchair.   12/23/20: Performed with feet on fold-out platform of her power wheelchair IND.    Time 8    Period Weeks    Status Achieved    Target Date 12/23/20      PT LONG TERM GOAL #4   Title Patient will perform simulated step-over task for clearing doorway threshold with use of front-wheeled walker as needed for entering/exiting home with supervision level of assistance and no LOB    Baseline 11/26/20: Pt currently uses power wheelchair to negotiate threshold into home.   12/23/20: Deferred.  01/15/21: Deferred.   02/10/21: Performed last week with step-over single line on floor with negligible height.    03/17/21: Performed over doorway threshold in clinic today with intermittent CGA and no LOB.    Time 16    Period Weeks    Status Partially Met    Target Date 04/10/21                   Plan - 03/27/21 1344     Clinical Impression Statement Patient exhibits improved performance of clearing obstacle with bilateral lower limbs in sitting, simulating demands of moving lower limbs into car seat/floorboard from sitting position. She demonstrates improved ability to clear obstacle on floor simulating clearing change in surface height on ground and demand of clearing doorway threshold. Patient exhibits ongoing difficulty with  fine motor grasping with L upper limb. She is minimally challenged with  fine pulp-to-pulp gripping with R hand. Patient has remaining deficits in increased tone of wrist/digit and elbow flexors (L>RUE), decreased L>R shoulder AROM, impaired trunk control, decreased LE strength and motor recruitment, gait deficits/deviations (most notably difficulty with L>R toe clearance during gait that is more apparent with fatigue), full weight acceptance onto bilateral LE, impaired postural stability in standing, and bed mobility/bed transfer limitations. Patient will benefit from continued skilled therapeutic intervention to address the above deficits as needed for improved function and QoL.    Personal Factors and Comorbidities Age;Comorbidity 3+;Time since onset of injury/illness/exacerbation    Comorbidities GERD, pre-diabetes, obesity, gout, s/p C3-7 ACDF    Examination-Activity Limitations Bathing;Continence;Reach Overhead;Stairs;Bed Mobility;Dressing;Self Feeding;Stand;Hygiene/Grooming;Toileting;Transfers;Locomotion Level    Examination-Participation Restrictions Community Activity;Yard Work;Laundry;Cleaning;Shop    Stability/Clinical Decision Making Evolving/Moderate complexity    Rehab Potential Fair    PT Frequency Other (comment)   2-3x/week   PT Duration Other (comment)   16 weeks   PT Treatment/Interventions Electrical Stimulation;Gait training;Stair training;Functional mobility training;Therapeutic activities;Therapeutic exercise;Balance training;Neuromuscular re-education;Wheelchair mobility training;Patient/family education    PT Next Visit Plan Gait re-training, task practice for transferring, LE strengthening, UE AROM and strengthening to assist with functional mobility and transfers. Weight-shifting and pre-gait activities with decreasing UE support.    PT Home Exercise Plan Pt continuing with home health exercises. Pt continuing with task practice for rolling, bridging, supine to sit transfers in  bed; walking with supervision/decreasing level fo assist with FWW; reaching and grasping with LUE. 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, initial encounter [facility])     Problem List Patient Active Problem List   Diagnosis Date Noted   Generalized anxiety disorder 01/24/2021   Suspected deep tissue injury 10/25/2020   Wheelchair dependence 11/27/2019   Spasticity 11/27/2019   Hyperkalemia    Steroid-induced hyperglycemia    Prediabetes    Neurogenic bowel    Neurogenic bladder    Neuropathic pain    Quadriplegia ([place]) 08/31/2019   S/P cervical spinal fusion 08/28/2019   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 [place], [place], [address] Phone: [phone]   Fax:  [phone]  Name: NICCOLE WITTHUHN MRN: [id number] Date of Birth: Sep 27, 1940

## 2021-03-31 ENCOUNTER — Ambulatory Visit: Payer: Medicare PPO | Admitting: Physical Therapy

## 2021-03-31 ENCOUNTER — Other Ambulatory Visit: Payer: Self-pay

## 2021-03-31 ENCOUNTER — Encounter: Payer: Self-pay | Admitting: Physical Therapy

## 2021-03-31 DIAGNOSIS — M6281 Muscle weakness (generalized): Secondary | ICD-10-CM

## 2021-03-31 DIAGNOSIS — S14123A 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, initial encounter: Secondary | ICD-10-CM

## 2021-03-31 DIAGNOSIS — R262 Difficulty in walking, not elsewhere classified: Secondary | ICD-10-CM | POA: Diagnosis not present

## 2021-03-31 NOTE — Therapy (Signed)
[facility] [address] [place], [place], [address] Phone: [phone]   Fax:  [phone]  Physical Therapy Treatment  Patient Details  Name: Meredith Roach MRN: [id number] Date of Birth: [birth year] Referring Provider (PT): [name], MD   Encounter Date: 03/31/2021   PT End of Session - 03/31/21 1135     Visit Number 44    Number of Visits 52    Date for PT Re-Evaluation 03/18/21    Authorization Time Period Cert 89/38/10-17/51/02, last progress note 03/17/21; current auth 11/10-12/29.    Progress Note Due on Visit 6    PT Start Time 1140    PT Stop Time 1233    PT Time Calculation (min) 53 min    Equipment Utilized During Treatment Gait belt   Pt uses power wheelchair, clinic's front-wheeled walker utilized for gait   Activity Tolerance Patient tolerated treatment well    Behavior During Therapy WFL for tasks assessed/performed             Past Medical History:  Diagnosis Date   Arthritis    GERD (gastroesophageal reflux disease)    Gout    Neuromuscular disorder ([place])    Pre-diabetes     Past Surgical History:  Procedure Laterality Date   ANTERIOR CERVICAL DECOMPRESSION/DISCECTOMY FUSION 4 LEVELS N/A 08/28/2019   Procedure: ANTERIOR CERVICAL DECOMPRESSION/DISCECTOMY FUSION CERVICALTHREE-FOUR CERVICAL,FOUR-FIVE,CERVICAL FIVE-SIX,CERVICAL SIX-SEVEN.;  Surgeon: [name], MD;  Location: [place];  Service: Neurosurgery;  Laterality: N/A;  ANTERIOR   BACK SURGERY     CATARACT EXTRACTION W/PHACO Right 03/31/2018   Procedure: CATARACT EXTRACTION PHACO AND INTRAOCULAR LENS PLACEMENT ([place]);  Surgeon: [name], MD;  Location: [facility];  Service: Ophthalmology;  Laterality: Right;  [place] 00:39.7 CDE 4.35 Fluid Pack Lot # [id number]   CHOLECYSTECTOMY  1995   COLONOSCOPY WITH PROPOFOL N/A 06/07/2017   Procedure: COLONOSCOPY WITH PROPOFOL;  Surgeon: [name], MD;  Location: [facility] ENDOSCOPY;  Service:  Endoscopy;  Laterality: N/A;   ESOPHAGOGASTRODUODENOSCOPY (EGD) WITH PROPOFOL N/A 06/07/2017   Procedure: ESOPHAGOGASTRODUODENOSCOPY (EGD) WITH PROPOFOL;  Surgeon: [name], MD;  Location: [facility] ENDOSCOPY;  Service: Endoscopy;  Laterality: N/A;   IR CATHETER TUBE CHANGE  05/10/2020   REDUCTION MAMMAPLASTY Bilateral 1994    There were no vitals filed for this visit.   Subjective Assessment - 03/31/21 1245     Subjective Patient reports pulling on poll which her husband was holding at home for supine to sit and sidelying to sit. She reports continued compliance with her home exercise program. She reports ongoing difficulties with bed mobility and supine to sit transfers. She and her husband are still wanting to work toward patient transferring from passenger seat of [employer] to [employer] with wheelchair ramp.    Patient is accompained by: Family member   husband   Pertinent History Patient is a 80 year old female s/p incomplete SCI. She had incomplete SCI following fall - C4 [place] D (DOI: 08/26/19). Primary activity limitations c difficulty walking, transferring, and completing independent functional mobility following incomplete SCI. She has undergone home health therapy for about one year. She and her husband reports she was able to transfer and ambulate with walker in home with PT walking beside her. Pt has practiced with front-wheel walker at home. 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 Patient reports using steady transfer aid for performing transfers chair to bed and W/C to standard chair. Her husband states that she  is able to stand with steady device and can stand with her legs against the bed. Pt has ramp to get in her home for her power wheelchair. Patient lives in one level home. Pt uses tub bench that she can utilize to slide into shower; her daughter/husband assists with bathing. Pt has [employer]i that can accommodate her power chair at this time. Pt has concrete  driveway to get up to her ramp. Pt has small threshold to get into her home - husband utilizes cardboard to get over threshold.    Limitations Walking;Standing;House hold activities;Other (comment)   transferring, bed mobility, bladder management   Patient Stated Goals to be able to walk more independently                  TREATMENT     Therapeutic Activities - patient education, repetitive task practice for improved performance of daily functional activities e.g. transferring, bed mobility    Stand-pivot transfer from power chair to low mat (and back at end of session), performed with CGA posteriorly only  Performance of supine to/from sit with pt able to assume partial sidelying position onto L forearm, dependent with lower limb management to attain lying position Performance of independent scooting on table to move from edge of mat toward middle  Car transfer simulation with lifting lower limbs over 4-inch step (single bar versus double side-by-side bars on step), edge of low mat; 1x over and back   -Repeated with 6-inch hurdle along corner of table (transferring lower limbs over hurdle with 90-degree turn in sitting)   Lower trunk rotations with bilateral lower limbs in hooklying  with dowel hold c bilat UE; for trunk control during rolling and re-positioning in bed; 1x12 alternating  Alternating bent-knee fallout in hooklying with dowel hold bilat UE; for trunk control during rolling and bed mobility tasks; 1x15 alternating   Gait with FWW in clinic; SBA/supervision throughout gait [cueing for toe clearance]; 2 laps around gym   Ambulation through doorway threshold and along outside sidewalk with FWW, with 180-degree turn to return to gym; x 5 minutes (pt negotiated threshold x 2)      *not today* Sit to stand from raised table, 2x10 with table height, 21-inches for height of table Seated toe tapping to Airex pad; x25 alternating to simulate demands of LE management/seated  hip and knee flexion needed for dressing/self-care and car transfer Dressing simulation with Theraband loop (see above); x 4 minutes Bed mobility task practice: Performed bilateral rolling on low mat with Independent roll to L x 5; roll to R x 5 [MaxA to initiate LUE cross-body shoulder adduction and to facilitate truncal rotation via pelvis, pt able to complete last 50% of roll with Minimal assist] Bridging; x25 as needed for scooting and re-positioning pelvis on bed          Neuromuscular Re-education - for increased motor recruitment of major muscle groups with motor impairment, nervous system priming and exercises to promote trunk stability    Supine alternating knee extension from hooklying position; 2x10 alternating    In // bars: Standing toe tapping onto 6-inch step to promote active dorsiflexion during LE swing and bilateral weight shift and weight acceptance; 1x10, 1x8 bilateral LE Sidestep in FWW along length of parallel bars; 1x D/B with CGA       *next visit* Hurdle step over 2-lb ankle weight on floor to simulate motor control and coordination demands for clearing threshold or minor change in surface height; 2x10 each LE  Standing reaching and grasping with marbles and white/yellow balls, grasping and placing into adjacent container on raised tray table; x 4 minutes (emphasis on LUE, minimal deficit with reaching/grasping RUE)    *not today* Standing toe tapping onto 6-inch step to promote active dorsiflexion during LE swing and bilateral weight shift and weight acceptance; x20 bilateral LE Reaching toward sticky-notes on backside of rolling mirror, 3 targets, with no UE support; 2x10 with bilateral UE [lower targets for L upper limb] Standing march with R upper extremity support only on single parallel bar, 1x10 alternating [full clearance of RLE, unweighting of LLE with inconsistent clearance from floor] Minisquat at edge of table; 2x10 with therapist guarding posteriorly  in case of loss of descending control Unilateral upper extremity reaching and grasping in standing position with minimal UE support, with bilateral fine motor task, pulp to pulp grasp for metal nuts, moving to adjacent container requiring cross-body reaching; x 2 min Standing heel-to-toe step over blue line; alternating LE; 2x10 alternating  Seated march on purple disc with alternating UE/LE; x25 alternating Sitting on purple disc, catching balloon and balloon tapping back to therapist - to facilitate active cross body adduction and trunk rotation as needed for rolling and reaching/grasping tasks (including low and high punches reaching today); x 2 minute catching, x 2 minutes balloon tapping         ASSESSMENT Patient demonstrates improving performance of simulated car transfer as exhibited by increasing height of hurdle over which pt had to lift bilateral lower limbs in sitting with 90-degree turn (performed going to L and R over hurdle). She is able to clear doorway threshold and negotiate outdoor sidewalk without LOB (CGA). Patient exhibits ongoing dependence with lower limb management during supine to sit and sitting to supine. She has ongoing deficits with rolling supine to sidelying (more limited with rolling to R) as needed for independent bed mobility. Patient has remaining deficits in increased tone of wrist/digit and elbow flexors (L>RUE), decreased L>R shoulder AROM, impaired trunk control, decreased LE strength and motor recruitment, gait deficits/deviations (most notably difficulty with L>R toe clearance during gait that is more apparent with fatigue), full weight acceptance onto bilateral LE, impaired postural stability in standing, and bed mobility/bed transfer limitations. Patient will benefit from continued skilled therapeutic intervention to address the above deficits as needed for improved function and QoL.          PT Short Term Goals - 03/17/21 1354       PT SHORT TERM GOAL  #1   Title Patient will perform stand-pivot transfer from chair to bed with supervision level of assist and ModI setup of power chair as needed for transferring at home    Baseline 11/26/20: Max assist for setup and mod assist to perform sit to stand, unsteady pivot prior to performing stand to sit.    12/23/20: Patient is able to setup power chair for transfer ModI. Mod to Max assist for sit to stand. CGA level of assist for standing pivot and stand to sit on low mat.   01/15/21: Setup of power chair without assist; modified independent pivot and stand-to-sit; ModA for initial set to stand prior to pivot.  02/10/21: ModI setup of power chair, ModA for initiation of sit to stand, modified-independent stand pivot transfer.     03/17/21: Performed with CGA during sit to stand only and supervision level of assist for remainder of stand-pivot transfer (performing from power chair to table)    Time 4    Period  Weeks    Status Partially Met    Target Date 04/07/21      PT SHORT TERM GOAL #2   Title Patient will perform standing toe tap to 3-inch surface (Airex pad) with bilateral upper extremity support indicative of improved ability to clear feet from floor during stepping and improved weight shift to each LE with AD use    Baseline 11/26/20: Limited toe clearance during bilateral LE swing phase with front-wheeled walker.   12/23/20: performed at previous follow-up with bilateral UE support.    Time 3    Period Weeks    Status Achieved    Target Date 12/17/20               PT Long Term Goals - 03/17/21 1156       PT LONG TERM GOAL #1   Title Patient will demonstrate improved function as evidenced by a score of 35 on FOTO measure for full participation in activities at home and in the community.    Baseline 11/26/20: FOTO 12.   12/23/20: FOTO 24.    01/15/21: FOTO 23.   02/10/21: FOTO 38.   03/17/21: 49    Time 16    Period Weeks    Status Achieved    Target Date 03/18/21      PT LONG TERM GOAL #2    Title Patient will ambulate for 150 feet without LOB, with sufficient toe clearance to prevent loss of gait stability, and c proper AD placement with least-restrictive assistive device with supervision level of assist    Baseline 11/26/20: Gait x 40 feet with CGA.   12/23/20: CGA with dec toe clearance L>RLE.   01/15/21: Ambulated 160 feet without LOB, proper AD placement; good toe clearance with first 80 feet, but decrased L>R toe clearanc with fatigue.   02/10/21: Performed with FWW with good toe clearance with first 80 feet, decreasing L toe clearance with succesive steps.   03/17/21: Performed without LOB, no significant instability with bilateral UE support on front-wheel walker; decreased toe clearance during gait L>R.    Time 12    Period Weeks    Status Partially Met    Target Date 04/10/21      PT LONG TERM GOAL #3   Title Patient will perform independent re-positioning in chair with use of closed-chain triceps on armrests as needed for independent wheelchair mobility with no verbal cueing or tactile cueing required    Baseline 11/26/20: Dependent re-positioning in wheelchair.   12/23/20: Performed with feet on fold-out platform of her power wheelchair IND.    Time 8    Period Weeks    Status Achieved    Target Date 12/23/20      PT LONG TERM GOAL #4   Title Patient will perform simulated step-over task for clearing doorway threshold with use of front-wheeled walker as needed for entering/exiting home with supervision level of assistance and no LOB    Baseline 11/26/20: Pt currently uses power wheelchair to negotiate threshold into home.   12/23/20: Deferred.  01/15/21: Deferred.   02/10/21: Performed last week with step-over single line on floor with negligible height.    03/17/21: Performed over doorway threshold in clinic today with intermittent CGA and no LOB.    Time 16    Period Weeks    Status Partially Met    Target Date 04/10/21                   Plan - 03/31/21 1302  Clinical Impression Statement Patient demonstrates improving performance of simulated car transfer as exhibited by increasing height of hurdle over which pt had to lift bilateral lower limbs in sitting with 90-degree turn (performed going to L and R over hurdle). She is able to clear doorway threshold and negotiate outdoor sidewalk without LOB (CGA). Patient exhibits ongoing dependence with lower limb management during supine to sit and sitting to supine. She has ongoing deficits with rolling supine to sidelying (more limited with rolling to R) as needed for independent bed mobility. Patient has remaining deficits in increased tone of wrist/digit and elbow flexors (L>RUE), decreased L>R shoulder AROM, impaired trunk control, decreased LE strength and motor recruitment, gait deficits/deviations (most notably difficulty with L>R toe clearance during gait that is more apparent with fatigue), full weight acceptance onto bilateral LE, impaired postural stability in standing, and bed mobility/bed transfer limitations. Patient will benefit from continued skilled therapeutic intervention to address the above deficits as needed for improved function and QoL.    Personal Factors and Comorbidities Age;Comorbidity 3+;Time since onset of injury/illness/exacerbation    Comorbidities GERD, pre-diabetes, obesity, gout, s/p C3-7 ACDF    Examination-Activity Limitations Bathing;Continence;Reach Overhead;Stairs;Bed Mobility;Dressing;Self Feeding;Stand;Hygiene/Grooming;Toileting;Transfers;Locomotion Level    Examination-Participation Restrictions Community Activity;Yard Work;Laundry;Cleaning;Shop    Stability/Clinical Decision Making Evolving/Moderate complexity    Rehab Potential Fair    PT Frequency Other (comment)   2-3x/week   PT Duration Other (comment)   16 weeks   PT Treatment/Interventions Electrical Stimulation;Gait training;Stair training;Functional mobility training;Therapeutic activities;Therapeutic  exercise;Balance training;Neuromuscular re-education;Wheelchair mobility training;Patient/family education    PT Next Visit Plan Gait re-training, task practice for transferring, LE strengthening, UE AROM and strengthening to assist with functional mobility and transfers. Weight-shifting and pre-gait activities with decreasing UE support.    PT Home Exercise Plan Pt continuing with home health exercises. Pt continuing with task practice for rolling, bridging, supine to sit transfers in bed; walking with supervision/decreasing level fo assist with FWW; reaching and grasping with LUE. 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, initial encounter [facility])     Problem List Patient Active Problem List   Diagnosis Date Noted   Generalized anxiety disorder 01/24/2021   Suspected deep tissue injury 10/25/2020   Wheelchair dependence 11/27/2019   Spasticity 11/27/2019   Hyperkalemia    Steroid-induced hyperglycemia    Prediabetes    Neurogenic bowel    Neurogenic bladder    Neuropathic pain    Quadriplegia ([place]) 08/31/2019   S/P cervical spinal fusion 08/28/2019   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 [place], [place], [address] Phone: [phone]   Fax:  [phone]  Name: AUNESTY TYSON MRN: [id number] Date of Birth: 09/25/1940

## 2021-04-02 ENCOUNTER — Ambulatory Visit: Payer: Medicare PPO | Admitting: Physical Therapy

## 2021-04-02 ENCOUNTER — Other Ambulatory Visit: Payer: Self-pay

## 2021-04-02 ENCOUNTER — Encounter: Payer: Self-pay | Admitting: Physical Therapy

## 2021-04-02 ENCOUNTER — Other Ambulatory Visit: Payer: Self-pay | Admitting: Physical Medicine and Rehabilitation

## 2021-04-02 DIAGNOSIS — M6281 Muscle weakness (generalized): Secondary | ICD-10-CM

## 2021-04-02 DIAGNOSIS — R262 Difficulty in walking, not elsewhere classified: Secondary | ICD-10-CM

## 2021-04-02 DIAGNOSIS — S14123A 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, initial encounter: Secondary | ICD-10-CM

## 2021-04-02 NOTE — Therapy (Signed)
[facility] [address] [place], [place], [address] Phone: [phone]   Fax:  [phone]  Physical Therapy Treatment  Patient Details  Name: Meredith Roach MRN: [id number] Date of Birth: 18-Jul-1940 Referring Provider (PT): [name], MD   Encounter Date: 04/02/2021   PT End of Session - 04/02/21 1304     Visit Number 45    Number of Visits 52    Date for PT Re-Evaluation 03/18/21    Authorization Time Period Cert 64/33/29-51/88/41, last progress note 03/17/21; current auth 11/10-12/29.    Progress Note Due on Visit 30    PT Start Time 1137    PT Stop Time 1234    PT Time Calculation (min) 57 min    Equipment Utilized During Treatment Gait belt   Pt uses power wheelchair, clinic's front-wheeled walker utilized for gait   Activity Tolerance Patient tolerated treatment well    Behavior During Therapy WFL for tasks assessed/performed             Past Medical History:  Diagnosis Date   Arthritis    GERD (gastroesophageal reflux disease)    Gout    Neuromuscular disorder ([place])    Pre-diabetes     Past Surgical History:  Procedure Laterality Date   ANTERIOR CERVICAL DECOMPRESSION/DISCECTOMY FUSION 4 LEVELS N/A 08/28/2019   Procedure: ANTERIOR CERVICAL DECOMPRESSION/DISCECTOMY FUSION CERVICALTHREE-FOUR CERVICAL,FOUR-FIVE,CERVICAL FIVE-SIX,CERVICAL SIX-SEVEN.;  Surgeon: [name], MD;  Location: [facility];  Service: Neurosurgery;  Laterality: N/A;  ANTERIOR   BACK SURGERY     CATARACT EXTRACTION W/PHACO Right 03/31/2018   Procedure: CATARACT EXTRACTION PHACO AND INTRAOCULAR LENS PLACEMENT ([place]);  Surgeon: [name], MD;  Location: [facility];  Service: Ophthalmology;  Laterality: Right;  [place] 00:39.7 CDE 4.35 Fluid Pack Lot # [id number]   CHOLECYSTECTOMY  1995   COLONOSCOPY WITH PROPOFOL N/A 06/07/2017   Procedure: COLONOSCOPY WITH PROPOFOL;  Surgeon: [name], MD;  Location: [facility] ENDOSCOPY;  Service:  Endoscopy;  Laterality: N/A;   ESOPHAGOGASTRODUODENOSCOPY (EGD) WITH PROPOFOL N/A 06/07/2017   Procedure: ESOPHAGOGASTRODUODENOSCOPY (EGD) WITH PROPOFOL;  Surgeon: [name], MD;  Location: [facility] ENDOSCOPY;  Service: Endoscopy;  Laterality: N/A;   IR CATHETER TUBE CHANGE  05/10/2020   REDUCTION MAMMAPLASTY Bilateral 1994    There were no vitals filed for this visit.   Subjective Assessment - 04/02/21 1139     Subjective Patient reports feeling better than earlier this week in regard to L upper limb/lower limb tightness. She reports continued compliance with HEP. She reports continued difficulty with bed mobility work. She feels that she is improving with gait and transfers.    Patient is accompained by: Family member   husband   Pertinent History Patient is a 80 year old female s/p incomplete SCI. She had incomplete SCI following fall - C4 [place] D (DOI: 08/26/19). Primary activity limitations c difficulty walking, transferring, and completing independent functional mobility following incomplete SCI. She has undergone home health therapy for about one year. She and her husband reports she was able to transfer and ambulate with walker in home with PT walking beside her. Pt has practiced with front-wheel walker at home. 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 Patient reports using steady transfer aid for performing transfers chair to bed and W/C to standard chair. Her husband states that she is able to stand with steady device and can stand with her legs against the bed. Pt has ramp to get in her home for her power  wheelchair. Patient lives in one level home. Pt uses tub bench that she can utilize to slide into shower; her daughter/husband assists with bathing. Pt has [employer] Lei that can accommodate her power chair at this time. Pt has concrete driveway to get up to her ramp. Pt has small threshold to get into her home - husband utilizes cardboard to get over threshold.    Limitations  Walking;Standing;House hold activities;Other (comment)   transferring, bed mobility, bladder management   Patient Stated Goals to be able to walk more independently                 TREATMENT     Therapeutic Activities - patient education, repetitive task practice for improved performance of daily functional activities e.g. transferring, bed mobility    Stand-pivot transfer from power chair to low mat (and back at end of session), performed with CGA posteriorly only   Performance of supine to/from sit with pt able to assume partial sidelying position onto L forearm, dependent with lower limb management to attain lying position Performance of independent scooting on table to move from edge of mat toward middle   Car transfer simulation with lifting lower limbs over  6-inch hurdle along corner of table (transferring lower limbs over hurdle with 90-degree turn in sitting); performed x 2 over and back   Lower trunk rotations with bilateral lower limbs in hooklying  with dowel hold c bilat UE; for trunk control during rolling and re-positioning in bed; 1x12 alternating   Ambulation through doorway threshold and along outside sidewalk with FWW, with 180-degree turn to return to gym; x 6 minutes (pt negotiated threshold x 2)      *not today* Alternating bent-knee fallout in hooklying with dowel hold bilat UE; for trunk control during rolling and bed mobility tasks; 1x15 alternating Gait with FWW in clinic; SBA/supervision throughout gait [cueing for toe clearance]; 2 laps around gym  Sit to stand from raised table, 2x10 with table height, 21-inches for height of table Seated toe tapping to Airex pad; x25 alternating to simulate demands of LE management/seated hip and knee flexion needed for dressing/self-care and car transfer Dressing simulation with Theraband loop (see above); x 4 minutes Bed mobility task practice: Performed bilateral rolling on low mat with Independent roll to L x 5;  roll to R x 5 [MaxA to initiate LUE cross-body shoulder adduction and to facilitate truncal rotation via pelvis, pt able to complete last 50% of roll with Minimal assist] Bridging; x25 as needed for scooting and re-positioning pelvis on bed          Neuromuscular Re-education - for increased motor recruitment of major muscle groups with motor impairment, nervous system priming and exercises to promote trunk stability    Supine alternating knee extension and shoulder elevation (dying bug technique) from hooklying position; 2x10 alternating     In // bars: Standing reaching and grasping with marbles and white/yellow balls, grasping and placing into adjacent container on raised tray table; x 5 minutes (emphasis on LUE, minimal deficit with reaching/grasping RUE)   *next visit* Standing toe tapping onto 6-inch step to promote active dorsiflexion during LE swing and bilateral weight shift and weight acceptance; 1x10, 1x8 bilateral LE Sidestep in FWW along length of parallel bars; 1x D/B with CGA     *not today* Hurdle step over 2-lb ankle weight on floor to simulate motor control and coordination demands for clearing threshold or minor change in surface height; 2x10 each LE Standing toe tapping onto  6-inch step to promote active dorsiflexion during LE swing and bilateral weight shift and weight acceptance; x20 bilateral LE Reaching toward sticky-notes on backside of rolling mirror, 3 targets, with no UE support; 2x10 with bilateral UE [lower targets for L upper limb] Standing march with R upper extremity support only on single parallel bar, 1x10 alternating [full clearance of RLE, unweighting of LLE with inconsistent clearance from floor] [place]qu[place] at edge of table; 2x10 with therapist guarding posteriorly in case of loss of descending control Unilateral upper extremity reaching and grasping in standing position with minimal UE support, with bilateral fine motor task, pulp to pulp grasp for  metal nuts, moving to adjacent container requiring cross-body reaching; x 2 min Standing heel-to-toe step over blue line; alternating LE; 2x10 alternating  Seated march on purple disc with alternating UE/LE; x25 alternating Sitting on purple disc, catching balloon and balloon tapping back to therapist - to facilitate active cross body adduction and trunk rotation as needed for rolling and reaching/grasping tasks (including low and high punches reaching today); x 2 minute catching, x 2 minutes balloon tapping         ASSESSMENT Patient is significantly challenged with pulp to pulp grasping task today with marbles. She is able to pick-up larger balls (golf-ball size), but she is unable to successfully grasp and move marbles to target today. She does demonstrate ongoing progress with ability to perform sitting hurdle negotiation, simulating demands of car transfer to passenger seat of car. She is able to safely clear doorway threshold and negotiate sidewalk with low-grade incline/decline with CGA. Patient has remaining deficits in increased tone of wrist/digit and elbow flexors (L>RUE), decreased L>R shoulder AROM, impaired trunk control, decreased LE strength and motor recruitment, gait deficits/deviations (most notably difficulty with L>R toe clearance during gait that is more apparent with fatigue), full weight acceptance onto bilateral LE, impaired postural stability in standing, and bed mobility/bed transfer limitations. Patient will benefit from continued skilled therapeutic intervention to address the above deficits as needed for improved function and QoL.        PT Short Term Goals - 03/17/21 1354       PT SHORT TERM GOAL #1   Title Patient will perform stand-pivot transfer from chair to bed with supervision level of assist and ModI setup of power chair as needed for transferring at home    Baseline 11/26/20: Max assist for setup and mod assist to perform sit to stand, unsteady pivot prior to  performing stand to sit.    12/23/20: Patient is able to setup power chair for transfer ModI. Mod to Max assist for sit to stand. CGA level of assist for standing pivot and stand to sit on low mat.   01/15/21: Setup of power chair without assist; modified independent pivot and stand-to-sit; ModA for initial set to stand prior to pivot.  02/10/21: ModI setup of power chair, ModA for initiation of sit to stand, modified-independent stand pivot transfer.     03/17/21: Performed with CGA during sit to stand only and supervision level of assist for remainder of stand-pivot transfer (performing from power chair to table)    Time 4    Period Weeks    Status Partially Met    Target Date 04/07/21      PT SHORT TERM GOAL #2   Title Patient will perform standing toe tap to 3-inch surface (Airex pad) with bilateral upper extremity support indicative of improved ability to clear feet from floor during stepping and improved weight  shift to each LE with AD use    Baseline 11/26/20: Limited toe clearance during bilateral LE swing phase with front-wheeled walker.   12/23/20: performed at previous follow-up with bilateral UE support.    Time 3    Period Weeks    Status Achieved    Target Date 12/17/20               PT Long Term Goals - 03/17/21 1156       PT LONG TERM GOAL #1   Title Patient will demonstrate improved function as evidenced by a score of 35 on FOTO measure for full participation in activities at home and in the community.    Baseline 11/26/20: FOTO 12.   12/23/20: FOTO 24.    01/15/21: FOTO 23.   02/10/21: FOTO 38.   03/17/21: 49    Time 16    Period Weeks    Status Achieved    Target Date 03/18/21      PT LONG TERM GOAL #2   Title Patient will ambulate for 150 feet without LOB, with sufficient toe clearance to prevent loss of gait stability, and c proper AD placement with least-restrictive assistive device with supervision level of assist    Baseline 11/26/20: Gait x 40 feet with CGA.    12/23/20: CGA with dec toe clearance L>RLE.   01/15/21: Ambulated 160 feet without LOB, proper AD placement; good toe clearance with first 80 feet, but decrased L>R toe clearanc with fatigue.   02/10/21: Performed with FWW with good toe clearance with first 80 feet, decreasing L toe clearance with succesive steps.   03/17/21: Performed without LOB, no significant instability with bilateral UE support on front-wheel walker; decreased toe clearance during gait L>R.    Time 12    Period Weeks    Status Partially Met    Target Date 04/10/21      PT LONG TERM GOAL #3   Title Patient will perform independent re-positioning in chair with use of closed-chain triceps on armrests as needed for independent wheelchair mobility with no verbal cueing or tactile cueing required    Baseline 11/26/20: Dependent re-positioning in wheelchair.   12/23/20: Performed with feet on fold-out platform of her power wheelchair IND.    Time 8    Period Weeks    Status Achieved    Target Date 12/23/20      PT LONG TERM GOAL #4   Title Patient will perform simulated step-over task for clearing doorway threshold with use of front-wheeled walker as needed for entering/exiting home with supervision level of assistance and no LOB    Baseline 11/26/20: Pt currently uses power wheelchair to negotiate threshold into home.   12/23/20: Deferred.  01/15/21: Deferred.   02/10/21: Performed last week with step-over single line on floor with negligible height.    03/17/21: Performed over doorway threshold in clinic today with intermittent CGA and no LOB.    Time 16    Period Weeks    Status Partially Met    Target Date 04/10/21                   Plan - 04/02/21 1649     Clinical Impression Statement Patient is significantly challenged with pulp to pulp grasping task today with marbles. She is able to pick-up larger balls (golf-ball size), but she is unable to successfully grasp and move marbles to target today. She does demonstrate  ongoing progress with ability to perform sitting hurdle negotiation, simulating  demands of car transfer to passenger seat of car. She is able to safely clear doorway threshold and negotiate sidewalk with low-grade incline/decline with CGA. Patient has remaining deficits in increased tone of wrist/digit and elbow flexors (L>RUE), decreased L>R shoulder AROM, impaired trunk control, decreased LE strength and motor recruitment, gait deficits/deviations (most notably difficulty with L>R toe clearance during gait that is more apparent with fatigue), full weight acceptance onto bilateral LE, impaired postural stability in standing, and bed mobility/bed transfer limitations. Patient will benefit from continued skilled therapeutic intervention to address the above deficits as needed for improved function and QoL.    Personal Factors and Comorbidities Age;Comorbidity 3+;Time since onset of injury/illness/exacerbation    Comorbidities GERD, pre-diabetes, obesity, gout, s/p C3-7 ACDF    Examination-Activity Limitations Bathing;Continence;Reach Overhead;Stairs;Bed Mobility;Dressing;Self Feeding;Stand;Hygiene/Grooming;Toileting;Transfers;Locomotion Level    Examination-Participation Restrictions Community Activity;Yard Work;Laundry;Cleaning;Shop    Stability/Clinical Decision Making Evolving/Moderate complexity    Rehab Potential Fair    PT Frequency Other (comment)   2-3x/week   PT Duration Other (comment)   16 weeks   PT Treatment/Interventions Electrical Stimulation;Gait training;Stair training;Functional mobility training;Therapeutic activities;Therapeutic exercise;Balance training;Neuromuscular re-education;Wheelchair mobility training;Patient/family education    PT Next Visit Plan Gait re-training, task practice for transferring, LE strengthening, UE AROM and strengthening to assist with functional mobility and transfers. Weight-shifting and pre-gait activities with decreasing UE support.    PT Home Exercise  Plan Pt continuing with home health exercises. Pt continuing with task practice for rolling, bridging, supine to sit transfers in bed; walking with supervision/decreasing level fo assist with FWW; reaching and grasping with LUE. 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, initial encounter [facility])     Problem List Patient Active Problem List   Diagnosis Date Noted   Generalized anxiety disorder 01/24/2021   Suspected deep tissue injury 10/25/2020   Wheelchair dependence 11/27/2019   Spasticity 11/27/2019   Hyperkalemia    Steroid-induced hyperglycemia    Prediabetes    Neurogenic bowel    Neurogenic bladder    Neuropathic pain    Quadriplegia ([place]) 08/31/2019   S/P cervical spinal fusion 08/28/2019   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 [place], [place], [address] Phone: [phone]   Fax:  [phone]  Name: Meredith Roach MRN: [id number] Date of Birth: 02/11/1941

## 2021-04-09 ENCOUNTER — Other Ambulatory Visit: Payer: Self-pay

## 2021-04-09 ENCOUNTER — Ambulatory Visit: Payer: Medicare PPO | Admitting: Physical Therapy

## 2021-04-09 ENCOUNTER — Encounter: Payer: Self-pay | Admitting: Physical Therapy

## 2021-04-09 DIAGNOSIS — R262 Difficulty in walking, not elsewhere classified: Secondary | ICD-10-CM | POA: Diagnosis not present

## 2021-04-09 DIAGNOSIS — S14123A 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, initial encounter: Secondary | ICD-10-CM

## 2021-04-09 DIAGNOSIS — M6281 Muscle weakness (generalized): Secondary | ICD-10-CM

## 2021-04-09 NOTE — Therapy (Signed)
[facility] [address] [place], [place], [address] Phone: [phone]   Fax:  [phone]  Physical Therapy Treatment  Patient Details  Name: Meredith Roach MRN: [id number] Date of Birth: [birth year] Referring Provider (PT): [name], MD   Encounter Date: 04/09/2021   PT End of Session - 04/09/21 1407     Visit Number 46    Number of Visits 52    Date for PT Re-Evaluation 03/18/21    Authorization Time Period Cert 65/03/54-65/68/12, last progress note 03/17/21; current auth 11/10-12/29.    Progress Note Due on Visit 23    PT Start Time 1142    PT Stop Time 1235    PT Time Calculation (min) 53 min    Equipment Utilized During Treatment Gait belt   Pt uses power wheelchair, clinic's front-wheeled walker utilized for gait   Activity Tolerance Patient tolerated treatment well    Behavior During Therapy WFL for tasks assessed/performed             Past Medical History:  Diagnosis Date   Arthritis    GERD (gastroesophageal reflux disease)    Gout    Neuromuscular disorder ([place])    Pre-diabetes     Past Surgical History:  Procedure Laterality Date   ANTERIOR CERVICAL DECOMPRESSION/DISCECTOMY FUSION 4 LEVELS N/A 08/28/2019   Procedure: ANTERIOR CERVICAL DECOMPRESSION/DISCECTOMY FUSION CERVICALTHREE-FOUR CERVICAL,FOUR-FIVE,CERVICAL FIVE-SIX,CERVICAL SIX-SEVEN.;  Surgeon: [name], MD;  Location: [place];  Service: Neurosurgery;  Laterality: N/A;  ANTERIOR   BACK SURGERY     CATARACT EXTRACTION W/PHACO Right 03/31/2018   Procedure: CATARACT EXTRACTION PHACO AND INTRAOCULAR LENS PLACEMENT ([place]);  Surgeon: [name], MD;  Location: [facility];  Service: Ophthalmology;  Laterality: Right;  [place] 00:39.7 CDE 4.35 Fluid Pack Lot # [id number]   CHOLECYSTECTOMY  1995   COLONOSCOPY WITH PROPOFOL N/A 06/07/2017   Procedure: COLONOSCOPY WITH PROPOFOL;  Surgeon: [name], MD;  Location[facility] ENDOSCOPY;  Service:  Endoscopy;  Laterality: N/A;   ESOPHAGOGASTRODUODENOSCOPY (EGD) WITH PROPOFOL N/A 06/07/2017   Procedure: ESOPHAGOGASTRODUODENOSCOPY (EGD) WITH PROPOFOL;  Surgeon: [name], MD;  Location: [facility] ENDOSCOPY;  Service: Endoscopy;  Laterality: N/A;   IR CATHETER TUBE CHANGE  05/10/2020   REDUCTION MAMMAPLASTY Bilateral 1994    There were no vitals filed for this visit.   Subjective Assessment - 04/09/21 1406     Subjective Patient reports notable tightness affecting L lower limb today, which she associates with cold weather. Patient continues compliance with HEP, though her home program has been disrupted partially by recent holiday gatherings/traditions.    Patient is accompained by: Family member   husband   Pertinent History Patient is a 80 year old female s/p incomplete SCI. She had incomplete SCI following fall - C4 [place] D (DOI: 08/26/19). Primary activity limitations c difficulty walking, transferring, and completing independent functional mobility following incomplete SCI. She has undergone home health therapy for about one year. She and her husband reports she was able to transfer and ambulate with walker in home with PT walking beside her. Pt has practiced with front-wheel walker at home. 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 Patient reports using steady transfer aid for performing transfers chair to bed and W/C to standard chair. Her husband states that she is able to stand with steady device and can stand with her legs against the bed. Pt has ramp to get in her home for her power wheelchair. Patient lives in one level home. Pt  uses tub bench that she can utilize to slide into shower; her daughter/husband assists with bathing. Pt has [employer] Lei that can accommodate her power chair at this time. Pt has concrete driveway to get up to her ramp. Pt has small threshold to get into her home - husband utilizes cardboard to get over threshold.    Limitations Walking;Standing;House hold  activities;Other (comment)   transferring, bed mobility, bladder management   Patient Stated Goals to be able to walk more independently               TREATMENT     Therapeutic Activities - patient education, repetitive task practice for improved performance of daily functional activities e.g. transferring, bed mobility    Stand-pivot transfer from power chair to low mat (and back at end of session), performed with CGA posteriorly only   Performance of supine to/from sit with pt able to assume partial sidelying position onto L forearm, dependent with lower limb management to attain lying position Performance of independent scooting on table to move from edge of mat toward middle   Bed mobility task practice: Performed bilateral rolling on low mat with Independent roll to L x 2; roll to R x 2 [ModA to initiate roll in either direction, minimal L upper limb shoulder adduction available to assist with rolling]. Repeated performance with use of therapists hand to simulate use of bed ladder/adaptive equipment to enable pt to pull herself into sidelying, performed on L and R side  Car transfer simulation with lifting lower limbs over 6-inch hurdle along corner of table (transferring lower limbs over hurdle with 90-degree turn in sitting); performed x 1 over and back    Patient education: Discussed use of adaptive equipment e.g. bed ladder for assisting with bed mobility (supine to sit, rolling) and showed online resources to patient and husband    *next visit* Ambulation through doorway threshold and along outside sidewalk with FWW, with 180-degree turn to return to gym; x 6 minutes (pt negotiated threshold x 2)      *not today* Lower trunk rotations with bilateral lower limbs in hooklying  with dowel hold c bilat UE; for trunk control during rolling and re-positioning in bed; 1x12 alternating Alternating bent-knee fallout in hooklying with dowel hold bilat UE; for trunk control during  rolling and bed mobility tasks; 1x15 alternating Gait with FWW in clinic; SBA/supervision throughout gait [cueing for toe clearance]; 2 laps around gym  Sit to stand from raised table, 2x10 with table height, 21-inches for height of table Seated toe tapping to Airex pad; x25 alternating to simulate demands of LE management/seated hip and knee flexion needed for dressing/self-care and car transfer Dressing simulation with Theraband loop (see above); x 4 minutes Bed mobility task practice: Performed bilateral rolling on low mat with Independent roll to L x 5; roll to R x 5 [MaxA to initiate LUE cross-body shoulder adduction and to facilitate truncal rotation via pelvis, pt able to complete last 50% of roll with Minimal assist] Bridging; x25 as needed for scooting and re-positioning pelvis on bed          Neuromuscular Re-education - for increased motor recruitment of major muscle groups with motor impairment, nervous system priming and exercises to promote trunk stability   Sidelying clamshell with tactile cueing at trunk for maintaining sidelying position, to facilitate active lower limb lifting in sidelying position as needed for independent sitting to supine/supine to sit transfers; x20 on each side    Adjacent to low mat  while standing in FWW: Standing reaching and grasping with marbles and white/yellow balls, grasping and placing into adjacent container on raised tray table; x 5 minutes (emphasis on LUE, minimal deficit with reaching/grasping RUE)     *next visit* Standing toe tapping onto 6-inch step to promote active dorsiflexion during LE swing and bilateral weight shift and weight acceptance; 1x10, 1x8 bilateral LE Sidestep in FWW along length of parallel bars; 1x D/B with CGA     *not today* Supine alternating knee extension and shoulder elevation (dying bug technique) from hooklying position; 2x10 alternating Hurdle step over 2-lb ankle weight on floor to simulate motor control  and coordination demands for clearing threshold or minor change in surface height; 2x10 each LE Standing toe tapping onto 6-inch step to promote active dorsiflexion during LE swing and bilateral weight shift and weight acceptance; x20 bilateral LE Reaching toward sticky-notes on backside of rolling mirror, 3 targets, with no UE support; 2x10 with bilateral UE [lower targets for L upper limb] Standing march with R upper extremity support only on single parallel bar, 1x10 alternating [full clearance of RLE, unweighting of LLE with inconsistent clearance from floor] Min[place]quat at edge of table; 2x10 with therapist guarding posteriorly in case of loss of descending control Unilateral upper extremity reaching and grasping in standing position with minimal UE support, with bilateral fine motor task, pulp to pulp grasp for metal nuts, moving to adjacent container requiring cross-body reaching; x 2 min Standing heel-to-toe step over blue line; alternating LE; 2x10 alternating          ASSESSMENT Patient demonstrates increasing L upper limb spasticity with difficulty performing PIP/DIP extension as needed to grasp items. She has ongoing difficulty with bed mobility (specifically, pt is limited with supine to/from sit and rolling). Pt is able to perform independent L turn with R upper limb pull (simulating use of adaptive equipment); pt has remaining difficulty with R rolling with attempted L and R upper limb pull.. She exhibits improving gait pattern with improved toe clearance. Patient is CGA only with stand-pivot transfers with FWW. Patient has remaining deficits in increased tone of wrist/digit and elbow flexors (L>RUE), decreased L>R shoulder AROM, impaired trunk control, decreased LE strength and motor recruitment, gait deficits/deviations (most notably difficulty with L>R toe clearance during gait that is more apparent with fatigue), full weight acceptance onto bilateral LE, impaired postural stability in  standing, and bed mobility/bed transfer limitations. Patient will benefit from continued skilled therapeutic intervention to address the above deficits as needed for improved function and QoL.       PT Short Term Goals - 03/17/21 1354       PT SHORT TERM GOAL #1   Title Patient will perform stand-pivot transfer from chair to bed with supervision level of assist and ModI setup of power chair as needed for transferring at home    Baseline 11/26/20: Max assist for setup and mod assist to perform sit to stand, unsteady pivot prior to performing stand to sit.    12/23/20: Patient is able to setup power chair for transfer ModI. Mod to Max assist for sit to stand. CGA level of assist for standing pivot and stand to sit on low mat.   01/15/21: Setup of power chair without assist; modified independent pivot and stand-to-sit; ModA for initial set to stand prior to pivot.  02/10/21: ModI setup of power chair, ModA for initiation of sit to stand, modified-independent stand pivot transfer.     03/17/21: Performed with CGA during sit  to stand only and supervision level of assist for remainder of stand-pivot transfer (performing from power chair to table)    Time 4    Period Weeks    Status Partially Met    Target Date 04/07/21      PT SHORT TERM GOAL #2   Title Patient will perform standing toe tap to 3-inch surface (Airex pad) with bilateral upper extremity support indicative of improved ability to clear feet from floor during stepping and improved weight shift to each LE with AD use    Baseline 11/26/20: Limited toe clearance during bilateral LE swing phase with front-wheeled walker.   12/23/20: performed at previous follow-up with bilateral UE support.    Time 3    Period Weeks    Status Achieved    Target Date 12/17/20               PT Long Term Goals - 03/17/21 1156       PT LONG TERM GOAL #1   Title Patient will demonstrate improved function as evidenced by a score of 35 on FOTO measure for full  participation in activities at home and in the community.    Baseline 11/26/20: FOTO 12.   12/23/20: FOTO 24.    01/15/21: FOTO 23.   02/10/21: FOTO 38.   03/17/21: 49    Time 16    Period Weeks    Status Achieved    Target Date 03/18/21      PT LONG TERM GOAL #2   Title Patient will ambulate for 150 feet without LOB, with sufficient toe clearance to prevent loss of gait stability, and c proper AD placement with least-restrictive assistive device with supervision level of assist    Baseline 11/26/20: Gait x 40 feet with CGA.   12/23/20: CGA with dec toe clearance L>RLE.   01/15/21: Ambulated 160 feet without LOB, proper AD placement; good toe clearance with first 80 feet, but decrased L>R toe clearanc with fatigue.   02/10/21: Performed with FWW with good toe clearance with first 80 feet, decreasing L toe clearance with succesive steps.   03/17/21: Performed without LOB, no significant instability with bilateral UE support on front-wheel walker; decreased toe clearance during gait L>R.    Time 12    Period Weeks    Status Partially Met    Target Date 04/10/21      PT LONG TERM GOAL #3   Title Patient will perform independent re-positioning in chair with use of closed-chain triceps on armrests as needed for independent wheelchair mobility with no verbal cueing or tactile cueing required    Baseline 11/26/20: Dependent re-positioning in wheelchair.   12/23/20: Performed with feet on fold-out platform of her power wheelchair IND.    Time 8    Period Weeks    Status Achieved    Target Date 12/23/20      PT LONG TERM GOAL #4   Title Patient will perform simulated step-over task for clearing doorway threshold with use of front-wheeled walker as needed for entering/exiting home with supervision level of assistance and no LOB    Baseline 11/26/20: Pt currently uses power wheelchair to negotiate threshold into home.   12/23/20: Deferred.  01/15/21: Deferred.   02/10/21: Performed last week with step-over single  line on floor with negligible height.    03/17/21: Performed over doorway threshold in clinic today with intermittent CGA and no LOB.    Time 16    Period Weeks    Status Partially  Met    Target Date 04/10/21                   Plan - 04/09/21 1530     Clinical Impression Statement Patient demonstrates increasing L upper limb spasticity with difficulty performing PIP/DIP extension as needed to grasp items. She has ongoing difficulty with bed mobility (specifically, pt is limited with supine to/from sit and rolling). Pt is able to perform independent L turn with R upper limb pull (simulating use of adaptive equipment); pt has remaining difficulty with R rolling with attempted L and R upper limb pull.. She exhibits improving gait pattern with improved toe clearance. Patient is CGA only with stand-pivot transfers with FWW. Patient has remaining deficits in increased tone of wrist/digit and elbow flexors (L>RUE), decreased L>R shoulder AROM, impaired trunk control, decreased LE strength and motor recruitment, gait deficits/deviations (most notably difficulty with L>R toe clearance during gait that is more apparent with fatigue), full weight acceptance onto bilateral LE, impaired postural stability in standing, and bed mobility/bed transfer limitations. Patient will benefit from continued skilled therapeutic intervention to address the above deficits as needed for improved function and QoL.    Personal Factors and Comorbidities Age;Comorbidity 3+;Time since onset of injury/illness/exacerbation    Comorbidities GERD, pre-diabetes, obesity, gout, s/p C3-7 ACDF    Examination-Activity Limitations Bathing;Continence;Reach Overhead;Stairs;Bed Mobility;Dressing;Self Feeding;Stand;Hygiene/Grooming;Toileting;Transfers;Locomotion Level    Examination-Participation Restrictions Community Activity;Yard Work;Laundry;Cleaning;Shop    Stability/Clinical Decision Making Evolving/Moderate complexity    Rehab  Potential Fair    PT Frequency Other (comment)   2-3x/week   PT Duration Other (comment)   16 weeks   PT Treatment/Interventions Electrical Stimulation;Gait training;Stair training;Functional mobility training;Therapeutic activities;Therapeutic exercise;Balance training;Neuromuscular re-education;Wheelchair mobility training;Patient/family education    PT Next Visit Plan Gait re-training, task practice for transferring, LE strengthening, UE AROM and strengthening to assist with functional mobility and transfers. Weight-shifting and pre-gait activities with decreasing UE support.    PT Home Exercise Plan Pt continuing with home health exercises. Pt continuing with task practice for rolling, bridging, supine to sit transfers in bed; walking with supervision/decreasing level fo assist with FWW; reaching and grasping with LUE. 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, initial encounter [facility])     Problem List Patient Active Problem List   Diagnosis Date Noted   Generalized anxiety disorder 01/24/2021   Suspected deep tissue injury 10/25/2020   Wheelchair dependence 11/27/2019   Spasticity 11/27/2019   Hyperkalemia    Steroid-induced hyperglycemia    Prediabetes    Neurogenic bowel    Neurogenic bladder    Neuropathic pain    Quadriplegia ([place]) 08/31/2019   S/P cervical spinal fusion 08/28/2019   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 [place], [place], [address] Phone: [phone]   Fax:  [phone]  Name: Meredith Roach MRN:  [id number] Date of Birth: 12-27-1940

## 2021-04-11 ENCOUNTER — Encounter: Payer: Self-pay | Admitting: Physical Therapy

## 2021-04-11 ENCOUNTER — Other Ambulatory Visit: Payer: Self-pay

## 2021-04-11 ENCOUNTER — Ambulatory Visit: Payer: Medicare PPO | Admitting: Physical Therapy

## 2021-04-11 DIAGNOSIS — R262 Difficulty in walking, not elsewhere classified: Secondary | ICD-10-CM | POA: Diagnosis not present

## 2021-04-11 DIAGNOSIS — M6281 Muscle weakness (generalized): Secondary | ICD-10-CM

## 2021-04-11 DIAGNOSIS — S14123A 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, initial encounter: Secondary | ICD-10-CM

## 2021-04-11 NOTE — Therapy (Signed)
[facility] [address] [place], [place], [address] Phone: [phone]   Fax:  [phone]  Physical Therapy Treatment  Patient Details  Name: Meredith Roach MRN: [id number] Date of Birth: 1941/02/26 Referring Provider (PT): [name], MD   Encounter Date: 04/11/2021   PT End of Session - 04/11/21 1156     Visit Number 47    Number of Visits 52    Date for PT Re-Evaluation 03/18/21    Authorization Time Period Cert 49/44/96-75/91/63, last progress note 03/17/21; current auth 11/10-12/29.    Progress Note Due on Visit 7    PT Start Time 1050    PT Stop Time 1145    PT Time Calculation (min) 55 min    Equipment Utilized During Treatment Gait belt   Pt uses power wheelchair, clinic's front-wheeled walker utilized for gait   Activity Tolerance Patient tolerated treatment well    Behavior During Therapy WFL for tasks assessed/performed             Past Medical History:  Diagnosis Date   Arthritis    GERD (gastroesophageal reflux disease)    Gout    Neuromuscular disorder ([place])    Pre-diabetes     Past Surgical History:  Procedure Laterality Date   ANTERIOR CERVICAL DECOMPRESSION/DISCECTOMY FUSION 4 LEVELS N/A 08/28/2019   Procedure: ANTERIOR CERVICAL DECOMPRESSION/DISCECTOMY FUSION CERVICALTHREE-FOUR CERVICAL,FOUR-FIVE,CERVICAL FIVE-SIX,CERVICAL SIX-SEVEN.;  Surgeon: [name], MD;  Location: [place];  Service: Neurosurgery;  Laterality: N/A;  ANTERIOR   BACK SURGERY     CATARACT EXTRACTION W/PHACO Right 03/31/2018   Procedure: CATARACT EXTRACTION PHACO AND INTRAOCULAR LENS PLACEMENT ([place]);  Surgeon: [name], MD;  Location: [facility];  Service: Ophthalmology;  Laterality: Right;  [place] 00:39.7 CDE 4.35 Fluid Pack Lot # [id number]   CHOLECYSTECTOMY  1995   COLONOSCOPY WITH PROPOFOL N/A 06/07/2017   Procedure: COLONOSCOPY WITH PROPOFOL;  Surgeon: [name], MD;  Location[facility] ENDOSCOPY;  Service:  Endoscopy;  Laterality: N/A;   ESOPHAGOGASTRODUODENOSCOPY (EGD) WITH PROPOFOL N/A 06/07/2017   Procedure: ESOPHAGOGASTRODUODENOSCOPY (EGD) WITH PROPOFOL;  Surgeon: [name], MD;  Location: [facility] ENDOSCOPY;  Service: Endoscopy;  Laterality: N/A;   IR CATHETER TUBE CHANGE  05/10/2020   REDUCTION MAMMAPLASTY Bilateral 1994    There were no vitals filed for this visit.   Subjective Assessment - 04/11/21 1155     Subjective Pt states that L lower limb tightness is better than earlier this week - this usually improves with movement. Patient and her husband have ordered bed ladder to assist with bed mobility and rolling at home. Patient is compliant with her HEP.    Patient is accompained by: Family member   husband   Pertinent History Patient is a 80 year old female s/p incomplete SCI. She had incomplete SCI following fall - C4 [place] D (DOI: 08/26/19). Primary activity limitations c difficulty walking, transferring, and completing independent functional mobility following incomplete SCI. She has undergone home health therapy for about one year. She and her husband reports she was able to transfer and ambulate with walker in home with PT walking beside her. Pt has practiced with front-wheel walker at home. 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 Patient reports using steady transfer aid for performing transfers chair to bed and W/C to standard chair. Her husband states that she is able to stand with steady device and can stand with her legs against the bed. Pt has ramp to get in her home for  her power wheelchair. Patient lives in one level home. Pt uses tub bench that she can utilize to slide into shower; her daughter/husband assists with bathing. Pt has [employer] Lei that can accommodate her power chair at this time. Pt has concrete driveway to get up to her ramp. Pt has small threshold to get into her home - husband utilizes cardboard to get over threshold.    Limitations Walking;Standing;House hold  activities;Other (comment)   transferring, bed mobility, bladder management   Patient Stated Goals to be able to walk more independently               TREATMENT     Therapeutic Activities - patient education, repetitive task practice for improved performance of daily functional activities e.g. transferring, bed mobility    Stand-pivot transfer from power chair to low mat (and back at end of session), performed with CGA posteriorly only   Performance of supine to/from sit with pt able to assume partial sidelying position onto L forearm, MaxA with lower limb management to attain lying position [pt able to raise R lower limb to edge of bed, dependent L lower limb management to assume sidelying position prior to rolling to supine] Performance of independent scooting on table to move from edge of mat toward middle   Bed mobility task practice: Performed bilateral rolling on low mat with ModA roll to L x 1; roll to R x 1 [ModA to initiate roll in either direction, minimal L upper limb shoulder adduction available to assist with rolling to R].    Car transfer simulation with lifting lower limbs over 6-inch hurdle along corner of table (transferring lower limbs over hurdle with 90-degree turn in sitting); performed x 1 over and back   Gait with FWW in clinic; SBA/supervision throughout gait [cueing for toe clearance]; 2 laps around gym           *not today* Ambulation through doorway threshold and along outside sidewalk with FWW, with 180-degree turn to return to gym; x 6 minutes (pt negotiated threshold x 2) Lower trunk rotations with bilateral lower limbs in hooklying  with dowel hold c bilat UE; for trunk control during rolling and re-positioning in bed; 1x12 alternating Alternating bent-knee fallout in hooklying with dowel hold bilat UE; for trunk control during rolling and bed mobility tasks; 1x15 alternating Sit to stand from raised table, 2x10 with table height, 21-inches for height  of table Seated toe tapping to Airex pad; x25 alternating to simulate demands of LE management/seated hip and knee flexion needed for dressing/self-care and car transfer Dressing simulation with Theraband loop (see above); x 4 minutes Bed mobility task practice: Performed bilateral rolling on low mat with Independent roll to L x 5; roll to R x 5 [MaxA to initiate LUE cross-body shoulder adduction and to facilitate truncal rotation via pelvis, pt able to complete last 50% of roll with Minimal assist] Bridging; x25 as needed for scooting and re-positioning pelvis on bed          Neuromuscular Re-education - for increased motor recruitment of major muscle groups with motor impairment, nervous system priming and exercises to promote trunk stability    Sidelying clamshell with tactile cueing at trunk for maintaining sidelying position, to facilitate active lower limb lifting in sidelying position as needed for independent sitting to supine/supine to sit transfers; x20 on each side   Supine alternating knee extension and shoulder elevation (dying bug technique) from hooklying position; 2x10 alternating    Adjacent to low mat  while standing in FWW: Standing reaching and grasping with small cubes of varying size, cylinders, foam balls (in "Hand PT packet"); grasping and placing into adjacent container on raised tray table; x 5 minutes (emphasis on LUE, minimal deficit with reaching/grasping RUE)   Standing toe tapping onto 6-inch step to promote active dorsiflexion during LE swing and bilateral weight shift and weight acceptance; 2x10 bilateral LE      *not today* Sidestep in FWW along length of parallel bars; 1x D/B with CGA Hurdle step over 2-lb ankle weight on floor to simulate motor control and coordination demands for clearing threshold or minor change in surface height; 2x10 each LE Standing toe tapping onto 6-inch step to promote active dorsiflexion during LE swing and bilateral weight shift  and weight acceptance; x20 bilateral LE Reaching toward sticky-notes on backside of rolling mirror, 3 targets, with no UE support; 2x10 with bilateral UE [lower targets for L upper limb] Standing march with R upper extremity support only on single parallel bar, 1x10 alternating [full clearance of RLE, unweighting of LLE with inconsistent clearance from floor] [place]qu[place] at edge of table; 2x10 with therapist guarding posteriorly in case of loss of descending control Unilateral upper extremity reaching and grasping in standing position with minimal UE support, with bilateral fine motor task, pulp to pulp grasp for metal nuts, moving to adjacent container requiring cross-body reaching; x 2 min Standing heel-to-toe step over blue line; alternating LE; 2x10 alternating           ASSESSMENT Patient is able to perform reaching and grasping with items of various sizes today with combination of cross-body adduction of L upper extremity. Patient does have ongoing limitations with bed mobility, but she is able to perform L roll ModI with use of R upper limb to pull into sidelying as needed for transferring at home. She is able to clear both feet without toe drag during gait intermittently, but this is not consistent and pt does demonstrate decreased toe clearance with fatigue, which could be safety concern when negotiating uneven surfaces or change in surface elevation. She is able to clear 6-inch hurdle with bilateral lower limbs while performing 90-degree turn on low mat simulating moving lower limbs into floorboard of motor vehicle. Patient has remaining deficits in increased tone of wrist/digit and elbow flexors (L>RUE), decreased L>R shoulder AROM, impaired trunk control, decreased LE strength and motor recruitment, gait deficits/deviations (most notably difficulty with L>R toe clearance during gait that is more apparent with fatigue), full weight acceptance onto bilateral LE, impaired postural stability in  standing, and bed mobility/bed transfer limitations. Patient will benefit from continued skilled therapeutic intervention to address the above deficits as needed for improved function and QoL.       PT Short Term Goals - 03/17/21 1354       PT SHORT TERM GOAL #1   Title Patient will perform stand-pivot transfer from chair to bed with supervision level of assist and ModI setup of power chair as needed for transferring at home    Baseline 11/26/20: Max assist for setup and mod assist to perform sit to stand, unsteady pivot prior to performing stand to sit.    12/23/20: Patient is able to setup power chair for transfer ModI. Mod to Max assist for sit to stand. CGA level of assist for standing pivot and stand to sit on low mat.   01/15/21: Setup of power chair without assist; modified independent pivot and stand-to-sit; [place] for initial set to stand prior to  pivot.  02/10/21: ModI setup of power chair, ModA for initiation of sit to stand, modified-independent stand pivot transfer.     03/17/21: Performed with CGA during sit to stand only and supervision level of assist for remainder of stand-pivot transfer (performing from power chair to table)    Time 4    Period Weeks    Status Partially Met    Target Date 04/07/21      PT SHORT TERM GOAL #2   Title Patient will perform standing toe tap to 3-inch surface (Airex pad) with bilateral upper extremity support indicative of improved ability to clear feet from floor during stepping and improved weight shift to each LE with AD use    Baseline 11/26/20: Limited toe clearance during bilateral LE swing phase with front-wheeled walker.   12/23/20: performed at previous follow-up with bilateral UE support.    Time 3    Period Weeks    Status Achieved    Target Date 12/17/20               PT Long Term Goals - 03/17/21 1156       PT LONG TERM GOAL #1   Title Patient will demonstrate improved function as evidenced by a score of 35 on FOTO measure for full  participation in activities at home and in the community.    Baseline 11/26/20: FOTO 12.   12/23/20: FOTO 24.    01/15/21: FOTO 23.   02/10/21: FOTO 38.   03/17/21: 49    Time 16    Period Weeks    Status Achieved    Target Date 03/18/21      PT LONG TERM GOAL #2   Title Patient will ambulate for 150 feet without LOB, with sufficient toe clearance to prevent loss of gait stability, and c proper AD placement with least-restrictive assistive device with supervision level of assist    Baseline 11/26/20: Gait x 40 feet with CGA.   12/23/20: CGA with dec toe clearance L>RLE.   01/15/21: Ambulated 160 feet without LOB, proper AD placement; good toe clearance with first 80 feet, but decrased L>R toe clearanc with fatigue.   02/10/21: Performed with FWW with good toe clearance with first 80 feet, decreasing L toe clearance with succesive steps.   03/17/21: Performed without LOB, no significant instability with bilateral UE support on front-wheel walker; decreased toe clearance during gait L>R.    Time 12    Period Weeks    Status Partially Met    Target Date 04/10/21      PT LONG TERM GOAL #3   Title Patient will perform independent re-positioning in chair with use of closed-chain triceps on armrests as needed for independent wheelchair mobility with no verbal cueing or tactile cueing required    Baseline 11/26/20: Dependent re-positioning in wheelchair.   12/23/20: Performed with feet on fold-out platform of her power wheelchair IND.    Time 8    Period Weeks    Status Achieved    Target Date 12/23/20      PT LONG TERM GOAL #4   Title Patient will perform simulated step-over task for clearing doorway threshold with use of front-wheeled walker as needed for entering/exiting home with supervision level of assistance and no LOB    Baseline 11/26/20: Pt currently uses power wheelchair to negotiate threshold into home.   12/23/20: Deferred.  01/15/21: Deferred.   02/10/21: Performed last week with step-over single  line on floor with negligible height.  03/17/21: Performed over doorway threshold in clinic today with intermittent CGA and no LOB.    Time 16    Period Weeks    Status Partially Met    Target Date 04/10/21                   Plan - 04/12/21 1143     Clinical Impression Statement Patient is able to perform reaching and grasping with items of various sizes today with combination of cross-body adduction of L upper extremity. Patient does have ongoing limitations with bed mobility, but she is able to perform L roll ModI with use of R upper limb to pull into sidelying as needed for transferring at home. She is able to clear both feet without toe drag during gait intermittently, but this is not consistent and pt does demonstrate decreased toe clearance with fatigue, which could be safety concern when negotiating uneven surfaces or change in surface elevation. She is able to clear 6-inch hurdle with bilateral lower limbs while performing 90-degree turn on low mat simulating moving lower limbs into floorboard of motor vehicle. Patient has remaining deficits in increased tone of wrist/digit and elbow flexors (L>RUE), decreased L>R shoulder AROM, impaired trunk control, decreased LE strength and motor recruitment, gait deficits/deviations (most notably difficulty with L>R toe clearance during gait that is more apparent with fatigue), full weight acceptance onto bilateral LE, impaired postural stability in standing, and bed mobility/bed transfer limitations. Patient will benefit from continued skilled therapeutic intervention to address the above deficits as needed for improved function and QoL.    Personal Factors and Comorbidities Age;Comorbidity 3+;Time since onset of injury/illness/exacerbation    Comorbidities GERD, pre-diabetes, obesity, gout, s/p C3-7 ACDF    Examination-Activity Limitations Bathing;Continence;Reach Overhead;Stairs;Bed Mobility;Dressing;Self  Feeding;Stand;Hygiene/Grooming;Toileting;Transfers;Locomotion Level    Examination-Participation Restrictions Community Activity;Yard Work;Laundry;Cleaning;Shop    Stability/Clinical Decision Making Evolving/Moderate complexity    Rehab Potential Fair    PT Frequency Other (comment)   2-3x/week   PT Duration Other (comment)   16 weeks   PT Treatment/Interventions Electrical Stimulation;Gait training;Stair training;Functional mobility training;Therapeutic activities;Therapeutic exercise;Balance training;Neuromuscular re-education;Wheelchair mobility training;Patient/family education    PT Next Visit Plan Gait re-training, task practice for transferring, LE strengthening, UE AROM and strengthening to assist with functional mobility and transfers. Weight-shifting and pre-gait activities with decreasing UE support.    PT Home Exercise Plan Pt continuing with home health exercises. Pt continuing with task practice for rolling, bridging, supine to sit transfers in bed; walking with supervision/decreasing level fo assist with FWW; reaching and grasping with LUE. 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, initial encounter [facility])     Problem List Patient Active Problem List   Diagnosis Date Noted   Generalized anxiety disorder 01/24/2021   Suspected deep tissue injury 10/25/2020   Wheelchair dependence 11/27/2019   Spasticity 11/27/2019   Hyperkalemia    Steroid-induced hyperglycemia    Prediabetes     Neurogenic bowel    Neurogenic bladder    Neuropathic pain    Quadriplegia ([place]) 08/31/2019  S/P cervical spinal fusion 08/28/2019   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 [address]. [place], [place], [address] Phone: [phone]   Fax:  [phone]  Name: Meredith Roach MRN: [id number] Date of Birth: Jul 26, 1940

## 2021-04-16 ENCOUNTER — Other Ambulatory Visit: Payer: Self-pay

## 2021-04-16 ENCOUNTER — Ambulatory Visit: Payer: Medicare PPO | Attending: Physical Medicine and Rehabilitation | Admitting: Physical Therapy

## 2021-04-16 ENCOUNTER — Encounter: Payer: Self-pay | Admitting: Physical Therapy

## 2021-04-16 DIAGNOSIS — M6281 Muscle weakness (generalized): Secondary | ICD-10-CM | POA: Diagnosis present

## 2021-04-16 DIAGNOSIS — S14123A 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, initial encounter: Secondary | ICD-10-CM | POA: Insufficient documentation

## 2021-04-16 DIAGNOSIS — R262 Difficulty in walking, not elsewhere classified: Secondary | ICD-10-CM | POA: Insufficient documentation

## 2021-04-16 NOTE — Therapy (Signed)
[facility] [address] [place], [place], [address] Phone: [phone]   Fax:  [phone]  Physical Therapy Treatment  Patient Details  Name: Meredith Roach MRN: [id number] Date of Birth: 1940-10-27 Referring Provider (PT): [name], MD   Encounter Date: 04/16/2021   PT End of Session - 04/16/21 1403     Visit Number 48    Number of Visits 52    Date for PT Re-Evaluation 03/18/21    Authorization Time Period Cert 29/19/16-60/60/04, last progress note 03/17/21; current auth 11/10-12/29.    Progress Note Due on Visit 4    PT Start Time 1138    PT Stop Time 1222    PT Time Calculation (min) 44 min    Equipment Utilized During Treatment Gait belt   Pt uses power wheelchair, clinic's front-wheeled walker utilized for gait   Activity Tolerance Patient tolerated treatment well    Behavior During Therapy WFL for tasks assessed/performed             Past Medical History:  Diagnosis Date   Arthritis    GERD (gastroesophageal reflux disease)    Gout    Neuromuscular disorder ([place])    Pre-diabetes     Past Surgical History:  Procedure Laterality Date   ANTERIOR CERVICAL DECOMPRESSION/DISCECTOMY FUSION 4 LEVELS N/A 08/28/2019   Procedure: ANTERIOR CERVICAL DECOMPRESSION/DISCECTOMY FUSION CERVICALTHREE-FOUR CERVICAL,FOUR-FIVE,CERVICAL FIVE-SIX,CERVICAL SIX-SEVEN.;  Surgeon: [name], MD;  Location: [place];  Service: Neurosurgery;  Laterality: N/A;  ANTERIOR   BACK SURGERY     CATARACT EXTRACTION W/PHACO Right 03/31/2018   Procedure: CATARACT EXTRACTION PHACO AND INTRAOCULAR LENS PLACEMENT ([place]);  Surgeon: [name], MD;  Location: [facility];  Service: Ophthalmology;  Laterality: Right;  [place] 00:39.7 CDE 4.35 Fluid Pack Lot # [id number]   CHOLECYSTECTOMY  1995   COLONOSCOPY WITH PROPOFOL N/A 06/07/2017   Procedure: COLONOSCOPY WITH PROPOFOL;  Surgeon: [name], MD;  Location[facility];  Service: Endoscopy;   Laterality: N/A;   ESOPHAGOGASTRODUODENOSCOPY (EGD) WITH PROPOFOL N/A 06/07/2017   Procedure: ESOPHAGOGASTRODUODENOSCOPY (EGD) WITH PROPOFOL;  Surgeon: [name], MD;  Location: [facility];  Service: Endoscopy;  Laterality: N/A;   IR CATHETER TUBE CHANGE  05/10/2020   REDUCTION MAMMAPLASTY Bilateral 1994    There were no vitals filed for this visit.   Subjective Assessment - 04/16/21 1412     Subjective Patient reports continuous sensation of tightness affecting R upper and lower limbs, but no notable pain presently. She reports working on toe tapping at home with equipment found in her home. She is working on walking with her FWW daily with supervision from her family. Pt has upcoming botox injection this Friday for upper limb spasiticty. She reports trying her bed ladder at home and feels that this helped modestly with bed mobility.    Patient is accompained by: Family member   husband   Pertinent History Patient is a 81 year old female s/p incomplete SCI. She had incomplete SCI following fall - C4 [place] D (DOI: 08/26/19). Primary activity limitations c difficulty walking, transferring, and completing independent functional mobility following incomplete SCI. She has undergone home health therapy for about one year. She and her husband reports she was able to transfer and ambulate with walker in home with PT walking beside her. Pt has practiced with front-wheel walker at home. 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 Patient reports using steady transfer aid for performing transfers chair to bed and W/C to standard  chair. Her husband states that she is able to stand with steady device and can stand with her legs against the bed. Pt has ramp to get in her home for her power wheelchair. Patient lives in one level home. Pt uses tub bench that she can utilize to slide into shower; her daughter/husband assists with bathing. Pt has Lucianne Lei that can accommodate her power chair at this time.  Pt has concrete driveway to get up to her ramp. Pt has small threshold to get into her home - husband utilizes cardboard to get over threshold.    Limitations Walking;Standing;House hold activities;Other (comment)   transferring, bed mobility, bladder management   Patient Stated Goals to be able to walk more independently                    TREATMENT     Therapeutic Activities - patient education, repetitive task practice for improved performance of daily functional activities e.g. transferring, bed mobility    Performance of supine to/from sit with pt able to assume partial sidelying position onto L forearm  [pt able to raise R lower limb to edge of bed with MinA, MaxA L lower limb management to assume sidelying position prior to rolling to supine], independent rolling from L sidelying to supine Performance of independent scooting on table to move from edge of mat toward middle   Bed mobility task practice: Performed bilateral rolling on low mat with using R upper limb to pull into either position (minimal pull required to perform roll); x10 each direction  Gait with FWW in clinic; SBA/supervision throughout gait [cueing for toe clearance]; 2 laps around gym   Sit to stand from 26-in table height (simulating height of ground to vehicle passenger seat); x3 [minimal challenge with this transfer to edge of bed]          *not today* Car transfer simulation with lifting lower limbs over 6-inch hurdle along corner of table (transferring lower limbs over hurdle with 90-degree turn in sitting); performed x 1 over and back Ambulation through doorway threshold and along outside sidewalk with FWW, with 180-degree turn to return to gym; x 6 minutes (pt negotiated threshold x 2) Lower trunk rotations with bilateral lower limbs in hooklying  with dowel hold c bilat UE; for trunk control during rolling and re-positioning in bed; 1x12 alternating Alternating bent-knee fallout in hooklying with  dowel hold bilat UE; for trunk control during rolling and bed mobility tasks; 1x15 alternating Sit to stand from raised table, 2x10 with table height, 21-inches for height of table Seated toe tapping to Airex pad; x25 alternating to simulate demands of LE management/seated hip and knee flexion needed for dressing/self-care and car transfer Dressing simulation with Theraband loop (see above); x 4 minutes Bed mobility task practice: Performed bilateral rolling on low mat with Independent roll to L x 5; roll to R x 5 [MaxA to initiate LUE cross-body shoulder adduction and to facilitate truncal rotation via pelvis, pt able to complete last 50% of roll with Minimal assist] Bridging; x25 as needed for scooting and re-positioning pelvis on bed          Neuromuscular Re-education - for increased motor recruitment of major muscle groups with motor impairment, nervous system priming and exercises to promote trunk stability     Supine alternating knee extension and shoulder elevation (dying bug technique) from hooklying position; 2x12 alternating     Adjacent to low mat while standing in FWW: Standing lateral toe tap to  Airex pad; 1x12 and 1x10, performed on R and L side [simulating demands of lifting legs for bed transfers and car transfer]   *next visit* Standing toe tapping onto 6-inch step to promote active dorsiflexion during LE swing and bilateral weight shift and weight acceptance; 2x10 bilateral LE     *not today* Standing reaching and grasping with small cubes of varying size, cylinders, foam balls (in "Hand PT packet"); grasping and placing into adjacent container on raised tray table; x 5 minutes (emphasis on LUE, minimal deficit with reaching/grasping RUE) Sidelying clamshell with tactile cueing at trunk for maintaining sidelying position, to facilitate active lower limb lifting in sidelying position as needed for independent sitting to supine/supine to sit transfers; x20 on each  side Sidestep in FWW along length of parallel bars; 1x D/B with CGA Hurdle step over 2-lb ankle weight on floor to simulate motor control and coordination demands for clearing threshold or minor change in surface height; 2x10 each LE Standing toe tapping onto 6-inch step to promote active dorsiflexion during LE swing and bilateral weight shift and weight acceptance; x20 bilateral LE Reaching toward sticky-notes on backside of rolling mirror, 3 targets, with no UE support; 2x10 with bilateral UE [lower targets for L upper limb] Standing march with R upper extremity support only on single parallel bar, 1x10 alternating [full clearance of RLE, unweighting of LLE with inconsistent clearance from floor] [place]qu[place] at edge of table; 2x10 with therapist guarding posteriorly in case of loss of descending control Unilateral upper extremity reaching and grasping in standing position with minimal UE support, with bilateral fine motor task, pulp to pulp grasp for metal nuts, moving to adjacent container requiring cross-body reaching; x 2 min Standing heel-to-toe step over blue line; alternating LE; 2x10 alternating            ASSESSMENT Patient is able to transfer to car-seat height with relative ease, but she may have more difficulty with negotiating running board of vehicle. She has demonstrated sufficient hip strength and trunk control for negotiating car door to move into passenger's seat. She is ModI with gait for home mobility distance, but she does have remaining deficits with toe clearance that are more apparent following fatigue (pt is able to clear both feet during swing phase with first lap around gym). She is fortunately able to perform modified-independent rolling with use of R upper limb (stronger side) to pull into sidelying in both directions (utilized therapist's hand as simulated bed rail/bed ladder to assist bed mobility work). Patient has remaining deficits in increased tone of wrist/digit and  elbow flexors (L>RUE), decreased L>R shoulder AROM, impaired trunk control, decreased LE strength and motor recruitment, gait deficits/deviations (most notably difficulty with L>R toe clearance during gait that is more apparent with fatigue), full weight acceptance onto bilateral LE, impaired postural stability in standing, and bed mobility/bed transfer limitations. Patient will benefit from continued skilled therapeutic intervention to address the above deficits as needed for improved function and QoL.        PT Short Term Goals - 03/17/21 1354       PT SHORT TERM GOAL #1   Title Patient will perform stand-pivot transfer from chair to bed with supervision level of assist and ModI setup of power chair as needed for transferring at home    Baseline 11/26/20: Max assist for setup and mod assist to perform sit to stand, unsteady pivot prior to performing stand to sit.    12/23/20: Patient is able to setup power chair  for transfer [place]. Mod to Max assist for sit to stand. CGA level of assist for standing pivot and stand to sit on low mat.   01/15/21: Setup of power chair without assist; modified independent pivot and stand-to-sit; ModA for initial set to stand prior to pivot.  02/10/21: ModI setup of power chair, ModA for initiation of sit to stand, modified-independent stand pivot transfer.     03/17/21: Performed with CGA during sit to stand only and supervision level of assist for remainder of stand-pivot transfer (performing from power chair to table)    Time 4    Period Weeks    Status Partially Met    Target Date 04/07/21      PT SHORT TERM GOAL #2   Title Patient will perform standing toe tap to 3-inch surface (Airex pad) with bilateral upper extremity support indicative of improved ability to clear feet from floor during stepping and improved weight shift to each LE with AD use    Baseline 11/26/20: Limited toe clearance during bilateral LE swing phase with front-wheeled walker.   12/23/20: performed  at previous follow-up with bilateral UE support.    Time 3    Period Weeks    Status Achieved    Target Date 12/17/20               PT Long Term Goals - 03/17/21 1156       PT LONG TERM GOAL #1   Title Patient will demonstrate improved function as evidenced by a score of 35 on FOTO measure for full participation in activities at home and in the community.    Baseline 11/26/20: FOTO 12.   12/23/20: FOTO 24.    01/15/21: FOTO 23.   02/10/21: FOTO 38.   03/17/21: 49    Time 16    Period Weeks    Status Achieved    Target Date 03/18/21      PT LONG TERM GOAL #2   Title Patient will ambulate for 150 feet without LOB, with sufficient toe clearance to prevent loss of gait stability, and c proper AD placement with least-restrictive assistive device with supervision level of assist    Baseline 11/26/20: Gait x 40 feet with CGA.   12/23/20: CGA with dec toe clearance L>RLE.   01/15/21: Ambulated 160 feet without LOB, proper AD placement; good toe clearance with first 80 feet, but decrased L>R toe clearanc with fatigue.   02/10/21: Performed with FWW with good toe clearance with first 80 feet, decreasing L toe clearance with succesive steps.   03/17/21: Performed without LOB, no significant instability with bilateral UE support on front-wheel walker; decreased toe clearance during gait L>R.    Time 12    Period Weeks    Status Partially Met    Target Date 04/10/21      PT LONG TERM GOAL #3   Title Patient will perform independent re-positioning in chair with use of closed-chain triceps on armrests as needed for independent wheelchair mobility with no verbal cueing or tactile cueing required    Baseline 11/26/20: Dependent re-positioning in wheelchair.   12/23/20: Performed with feet on fold-out platform of her power wheelchair IND.    Time 8    Period Weeks    Status Achieved    Target Date 12/23/20      PT LONG TERM GOAL #4   Title Patient will perform simulated step-over task for clearing  doorway threshold with use of front-wheeled walker as needed for entering/exiting home with  supervision level of assistance and no LOB    Baseline 11/26/20: Pt currently uses power wheelchair to negotiate threshold into home.   12/23/20: Deferred.  01/15/21: Deferred.   02/10/21: Performed last week with step-over single line on floor with negligible height.    03/17/21: Performed over doorway threshold in clinic today with intermittent CGA and no LOB.    Time 16    Period Weeks    Status Partially Met    Target Date 04/10/21                   Plan - 04/16/21 1411     Clinical Impression Statement Patient is able to transfer to car-seat height with relative ease, but she may have more difficulty with negotiating running board of vehicle. She has demonstrated sufficient hip strength and trunk control for negotiating car door to move into passenger's seat. She is ModI with gait for home mobility distance, but she does have remaining deficits with toe clearance that are more apparent following fatigue (pt is able to clear both feet during swing phase with first lap around gym). She is fortunately able to perform modified-independent rolling with use of R upper limb (stronger side) to pull into sidelying in both directions (utilized therapist's hand as simulated bed rail/bed ladder to assist bed mobility work). Patient has remaining deficits in increased tone of wrist/digit and elbow flexors (L>RUE), decreased L>R shoulder AROM, impaired trunk control, decreased LE strength and motor recruitment, gait deficits/deviations (most notably difficulty with L>R toe clearance during gait that is more apparent with fatigue), full weight acceptance onto bilateral LE, impaired postural stability in standing, and bed mobility/bed transfer limitations. Patient will benefit from continued skilled therapeutic intervention to address the above deficits as needed for improved function and QoL.    Personal Factors and  Comorbidities Age;Comorbidity 3+;Time since onset of injury/illness/exacerbation    Comorbidities GERD, pre-diabetes, obesity, gout, s/p C3-7 ACDF    Examination-Activity Limitations Bathing;Continence;Reach Overhead;Stairs;Bed Mobility;Dressing;Self Feeding;Stand;Hygiene/Grooming;Toileting;Transfers;Locomotion Level    Examination-Participation Restrictions Community Activity;Yard Work;Laundry;Cleaning;Shop    Stability/Clinical Decision Making Evolving/Moderate complexity    Rehab Potential Fair    PT Frequency Other (comment)   2-3x/week   PT Duration Other (comment)   16 weeks   PT Treatment/Interventions Electrical Stimulation;Gait training;Stair training;Functional mobility training;Therapeutic activities;Therapeutic exercise;Balance training;Neuromuscular re-education;Wheelchair mobility training;Patient/family education    PT Next Visit Plan Gait re-training, task practice for transferring, LE strengthening, UE AROM and strengthening to assist with functional mobility and transfers. Weight-shifting and pre-gait activities with decreasing UE support.    PT Home Exercise Plan Pt continuing with home health exercises. Pt continuing with task practice for rolling, bridging, supine to sit transfers in bed; walking with supervision/decreasing level fo assist with FWW; reaching and grasping with LUE. 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, initial encounter [facility])     Problem List Patient Active  Problem List   Diagnosis Date Noted   Generalized anxiety disorder 01/24/2021   Suspected deep tissue injury 10/25/2020   Wheelchair dependence  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 [place], [place], [address] Phone: [phone]   Fax:  [phone]  Name: VERNETTA DIZDAREVIC MRN: [id number] Date of Birth: [birth year]

## 2021-04-18 ENCOUNTER — Encounter: Payer: Medicare PPO | Attending: Physical Medicine and Rehabilitation | Admitting: Physical Medicine & Rehabilitation

## 2021-04-18 ENCOUNTER — Other Ambulatory Visit: Payer: Self-pay

## 2021-04-18 ENCOUNTER — Encounter: Payer: Self-pay | Admitting: Physical Medicine & Rehabilitation

## 2021-04-18 VITALS — BP 114/74 | HR 85 | Temp 98.2°F | Ht 63.0 in | Wt 200.0 lb

## 2021-04-18 DIAGNOSIS — G8252 Quadriplegia, C1-C4 incomplete: Secondary | ICD-10-CM | POA: Diagnosis not present

## 2021-04-18 DIAGNOSIS — F411 Generalized anxiety disorder: Secondary | ICD-10-CM | POA: Diagnosis present

## 2021-04-18 DIAGNOSIS — Z993 Dependence on wheelchair: Secondary | ICD-10-CM | POA: Diagnosis present

## 2021-04-18 DIAGNOSIS — G8250 Quadriplegia, unspecified: Secondary | ICD-10-CM | POA: Diagnosis present

## 2021-04-18 DIAGNOSIS — R252 Cramp and spasm: Secondary | ICD-10-CM | POA: Insufficient documentation

## 2021-04-18 NOTE — Progress Notes (Signed)
Botox Injection for spasticity using needle EMG guidance  Patient referred by Dr. Alice Rieger for botulinum toxin injection to control severe spasticity left upper extremity.  Patient gets only partial relief with medication management and is currently getting outpatient PT OT with only partial relief.  Patient has some residual grasp function that she uses for bimanual holding.  Her main affected extremity is left upper. Per MD note spasticity affecting the hand is the most problematic.  Last botulinum toxin injection was performed about 3 months ago the patient feels like it is starting to wear off. Tone is MAS 2 at the lumbricals as well as finger flexors as well as elbow flexor on the left side Discussed average duration of Botox as well as potential effects of excessive weakness.  The patient would like to proceed.  We discussed the average onset is 1 to 2 weeks.  Dilution: 50 Units/ml Indication: Severe spasticity which interferes with ADL,mobility and/or  hygiene and is unresponsive to medication management and other conservative care Informed consent was obtained after describing risks and benefits of the procedure with the patient. This includes bleeding, bruising, infection, excessive weakness, or medication side effects. A REMS form is on file and signed. Needle: 27g 1" needle electrode Number of units per muscle  Brachioradialis 25 FCR0 FCU0 FDS50 FDP25 FPL0 Lumbricals 25 x 3=75 Waste 25U All injections were done after obtaining appropriate EMG activity and after negative drawback for blood. The patient tolerated the procedure well. Post procedure instructions were given.

## 2021-04-18 NOTE — Patient Instructions (Signed)

## 2021-04-21 ENCOUNTER — Encounter: Payer: Self-pay | Admitting: Physical Therapy

## 2021-04-21 ENCOUNTER — Other Ambulatory Visit: Payer: Self-pay

## 2021-04-21 ENCOUNTER — Ambulatory Visit: Payer: Medicare PPO | Admitting: Physical Therapy

## 2021-04-21 DIAGNOSIS — S14123A 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, initial encounter: Secondary | ICD-10-CM

## 2021-04-21 DIAGNOSIS — R262 Difficulty in walking, not elsewhere classified: Secondary | ICD-10-CM | POA: Diagnosis not present

## 2021-04-21 DIAGNOSIS — M6281 Muscle weakness (generalized): Secondary | ICD-10-CM

## 2021-04-21 NOTE — Therapy (Signed)
[facility] [address] [place], [place], [address] Phone: [phone]   Fax:  [phone]  Physical Therapy Treatment  Patient Details  Name: Meredith Roach MRN: [id number] Date of Birth: October 30, 1940 Referring Provider (PT): [name], MD   Encounter Date: 04/21/2021   PT End of Session - 04/21/21 1425     Visit Number 96    Number of Visits 70    Date for PT Re-Evaluation 03/18/21    Authorization Time Period Cert 10/04/74-28/31/51, last progress note 03/17/21; current auth 04/11/21-05/21/21    Authorization - Visit Number 3    Authorization - Number of Visits 12    Progress Note Due on Visit 70    PT Start Time 1324    PT Stop Time 1415    PT Time Calculation (min) 51 min    Equipment Utilized During Treatment Gait belt   Pt uses power wheelchair, clinic's front-wheeled walker utilized for gait   Activity Tolerance Patient tolerated treatment well    Behavior During Therapy WFL for tasks assessed/performed             Past Medical History:  Diagnosis Date   Arthritis    GERD (gastroesophageal reflux disease)    Gout    Neuromuscular disorder ([place])    Pre-diabetes     Past Surgical History:  Procedure Laterality Date   ANTERIOR CERVICAL DECOMPRESSION/DISCECTOMY FUSION 4 LEVELS N/A 08/28/2019   Procedure: ANTERIOR CERVICAL DECOMPRESSION/DISCECTOMY FUSION CERVICALTHREE-FOUR CERVICAL,FOUR-FIVE,CERVICAL FIVE-SIX,CERVICAL SIX-SEVEN.;  Surgeon: [name], MD;  Location: [place];  Service: Neurosurgery;  Laterality: N/A;  ANTERIOR   BACK SURGERY     CATARACT EXTRACTION W/PHACO Right 03/31/2018   Procedure: CATARACT EXTRACTION PHACO AND INTRAOCULAR LENS PLACEMENT ([place]);  Surgeon: [name], MD;  Location: [facility];  Service: Ophthalmology;  Laterality: Right;  [place] 00:39.7 CDE 4.35 Fluid Pack Lot # [id number]   CHOLECYSTECTOMY  1995   COLONOSCOPY WITH PROPOFOL N/A 06/07/2017   Procedure: COLONOSCOPY WITH PROPOFOL;   Surgeon: [name], MD;  Location: [facility] ENDOSCOPY;  Service: Endoscopy;  Laterality: N/A;   ESOPHAGOGASTRODUODENOSCOPY (EGD) WITH PROPOFOL N/A 06/07/2017   Procedure: ESOPHAGOGASTRODUODENOSCOPY (EGD) WITH PROPOFOL;  Surgeon: [name], MD;  Location: [facility] ENDOSCOPY;  Service: Endoscopy;  Laterality: N/A;   IR CATHETER TUBE CHANGE  05/10/2020   REDUCTION MAMMAPLASTY Bilateral 1994    There were no vitals filed for this visit.   Subjective Assessment - 04/21/21 1334     Subjective Patient reports undergoing root canal this AM. She reports more soreness and stinging in her L ankle due to not moving as much today. She underwent Botox injections this past Friday in L hand and forearm. Patient reports compliance with HEP. She reports difficulty with reching for bed ladder to assist with mobility.    Patient is accompained by: Family member   husband   Pertinent History Patient is a 81 year old female s/p incomplete SCI. She had incomplete SCI following fall - C4 [place] D (DOI: 08/26/19). Primary activity limitations c difficulty walking, transferring, and completing independent functional mobility following incomplete SCI. She has undergone home health therapy for about one year. She and her husband reports she was able to transfer and ambulate with walker in home with PT walking beside her. Pt has practiced with front-wheel walker at home. 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 Patient reports using steady transfer aid for performing transfers chair to bed and W/C to standard chair. Her husband  states that she is able to stand with steady device and can stand with her legs against the bed. Pt has ramp to get in her home for her power wheelchair. Patient lives in one level home. Pt uses tub bench that she can utilize to slide into shower; her daughter/husband assists with bathing. Pt has Lucianne Lei that can accommodate her power chair at this time. Pt has concrete driveway to get up to her  ramp. Pt has small threshold to get into her home - husband utilizes cardboard to get over threshold.    Limitations Walking;Standing;House hold activities;Other (comment)   transferring, bed mobility, bladder management   Patient Stated Goals to be able to walk more independently                  TREATMENT     Therapeutic Activities - patient education, repetitive task practice for improved performance of daily functional activities e.g. transferring, bed mobility    Gait with FWW in clinic; SBA/supervision throughout gait [cueing for toe clearance]; 2 laps around gym   Ambulation through doorway threshold and along outside sidewalk with FWW, with 180-degree turn to return to gym; x 6 minutes (pt negotiated threshold x 2)   Car transfer simulation with lifting lower limbs over 6-inch hurdle along corner of table (transferring lower limbs over hurdle with 90-degree turn in sitting); performed x 1 over and back   *next visit* Bed mobility task practice: Performed bilateral rolling on low mat with using R upper limb to pull into either position (minimal pull required to perform roll); x10 each direction     *not today* Performance of supine to/from sit with pt able to assume partial sidelying position onto L forearm  Lower trunk rotations with bilateral lower limbs in hooklying  with dowel hold c bilat UE; for trunk control during rolling and re-positioning in bed; 1x12 alternating Alternating bent-knee fallout in hooklying with dowel hold bilat UE; for trunk control during rolling and bed mobility tasks; 1x15 alternating Sit to stand from raised table, 2x10 with table height, 21-inches for height of table Seated toe tapping to Airex pad; x25 alternating to simulate demands of LE management/seated hip and knee flexion needed for dressing/self-care and car transfer Dressing simulation with Theraband loop (see above); x 4 minutes Bed mobility task practice: Performed bilateral  rolling on low mat with Independent roll to L x 5; roll to R x 5 [MaxA to initiate LUE cross-body shoulder adduction and to facilitate truncal rotation via pelvis, pt able to complete last 50% of roll with Minimal assist] Bridging; x25 as needed for scooting and re-positioning pelvis on bed          Neuromuscular Re-education - for increased motor recruitment of major muscle groups with motor impairment, nervous system priming and exercises to promote trunk stability     Adjacent to low mat while standing in FWW: Standing toe tapping onto 6-inch step to promote active dorsiflexion during LE swing and bilateral weight shift and weight acceptance; 2x15 bilateral LE   Standing reaching and grasping with small cubes of varying size, cylinders, foam balls (in "Hand PT packet"); grasping and placing into adjacent container on raised tray table, also performed with stacking 5 cones with L upper limb; x 5 minutes (emphasis on LUE, minimal deficit with reaching/grasping RUE)    *next visit* Standing lateral toe tap to Airex pad; 1x12 and 1x10, performed on R and L side [simulating demands of lifting legs for bed transfers and car  transfer]   *not today* Supine alternating knee extension and shoulder elevation (dying bug technique) from hooklying position; 2x12 alternating  Sidelying clamshell with tactile cueing at trunk for maintaining sidelying position, to facilitate active lower limb lifting in sidelying position as needed for independent sitting to supine/supine to sit transfers; x20 on each side Sidestep in FWW along length of parallel bars; 1x D/B with CGA Hurdle step over 2-lb ankle weight on floor to simulate motor control and coordination demands for clearing threshold or minor change in surface height; 2x10 each LE Standing toe tapping onto 6-inch step to promote active dorsiflexion during LE swing and bilateral weight shift and weight acceptance; x20 bilateral LE Reaching toward  sticky-notes on backside of rolling mirror, 3 targets, with no UE support; 2x10 with bilateral UE [lower targets for L upper limb] Standing march with R upper extremity support only on single parallel bar, 1x10 alternating [full clearance of RLE, unweighting of LLE with inconsistent clearance from floor] Minisquat at edge of table; 2x10 with therapist guarding posteriorly in case of loss of descending control Unilateral upper extremity reaching and grasping in standing position with minimal UE support, with bilateral fine motor task, pulp to pulp grasp for metal nuts, moving to adjacent container requiring cross-body reaching; x 2 min Standing heel-to-toe step over blue line; alternating LE; 2x10 alternating            ASSESSMENT Patient is able to perform pulp to pulp grip with items c rounded edges and sharp corners, and she is able to perform simulated car transfer with negotiating 6-inch hurdle and performing 90-degree turn from seated position. She demonstrates safe negotiation of clinic with [place] and doorway threshold as well as uneven sidewalk in spite of intermittent L>R decreased toe clearance during gait. She is making slow improvements with gait and lower limb management during transfers. Patient has remaining deficits in increased tone of wrist/digit and elbow flexors (L>RUE), decreased L>R shoulder AROM, impaired trunk control, decreased LE strength and motor recruitment, gait deficits/deviations (most notably difficulty with L>R toe clearance during gait that is more apparent with fatigue), full weight acceptance onto bilateral LE, impaired postural stability in standing, and bed mobility/bed transfer limitations. Patient will benefit from continued skilled therapeutic intervention to address the above deficits as needed for improved function and QoL.       PT Short Term Goals - 03/17/21 1354       PT SHORT TERM GOAL #1   Title Patient will perform stand-pivot transfer from chair to  bed with supervision level of assist and ModI setup of power chair as needed for transferring at home    Baseline 11/26/20: Max assist for setup and mod assist to perform sit to stand, unsteady pivot prior to performing stand to sit.    12/23/20: Patient is able to setup power chair for transfer ModI. Mod to Max assist for sit to stand. CGA level of assist for standing pivot and stand to sit on low mat.   01/15/21: Setup of power chair without assist; modified independent pivot and stand-to-sit; ModA for initial set to stand prior to pivot.  02/10/21: ModI setup of power chair, ModA for initiation of sit to stand, modified-independent stand pivot transfer.     03/17/21: Performed with CGA during sit to stand only and supervision level of assist for remainder of stand-pivot transfer (performing from power chair to table)    Time 4    Period Weeks    Status Partially Met  Target Date 04/07/21      PT SHORT TERM GOAL #2   Title Patient will perform standing toe tap to 3-inch surface (Airex pad) with bilateral upper extremity support indicative of improved ability to clear feet from floor during stepping and improved weight shift to each LE with AD use    Baseline 11/26/20: Limited toe clearance during bilateral LE swing phase with front-wheeled walker.   12/23/20: performed at previous follow-up with bilateral UE support.    Time 3    Period Weeks    Status Achieved    Target Date 12/17/20               PT Long Term Goals - 03/17/21 1156       PT LONG TERM GOAL #1   Title Patient will demonstrate improved function as evidenced by a score of 35 on FOTO measure for full participation in activities at home and in the community.    Baseline 11/26/20: FOTO 12.   12/23/20: FOTO 24.    01/15/21: FOTO 23.   02/10/21: FOTO 38.   03/17/21: 49    Time 16    Period Weeks    Status Achieved    Target Date 03/18/21      PT LONG TERM GOAL #2   Title Patient will ambulate for 150 feet without LOB, with  sufficient toe clearance to prevent loss of gait stability, and c proper AD placement with least-restrictive assistive device with supervision level of assist    Baseline 11/26/20: Gait x 40 feet with CGA.   12/23/20: CGA with dec toe clearance L>RLE.   01/15/21: Ambulated 160 feet without LOB, proper AD placement; good toe clearance with first 80 feet, but decrased L>R toe clearanc with fatigue.   02/10/21: Performed with FWW with good toe clearance with first 80 feet, decreasing L toe clearance with succesive steps.   03/17/21: Performed without LOB, no significant instability with bilateral UE support on front-wheel walker; decreased toe clearance during gait L>R.    Time 12    Period Weeks    Status Partially Met    Target Date 04/10/21      PT LONG TERM GOAL #3   Title Patient will perform independent re-positioning in chair with use of closed-chain triceps on armrests as needed for independent wheelchair mobility with no verbal cueing or tactile cueing required    Baseline 11/26/20: Dependent re-positioning in wheelchair.   12/23/20: Performed with feet on fold-out platform of her power wheelchair IND.    Time 8    Period Weeks    Status Achieved    Target Date 12/23/20      PT LONG TERM GOAL #4   Title Patient will perform simulated step-over task for clearing doorway threshold with use of front-wheeled walker as needed for entering/exiting home with supervision level of assistance and no LOB    Baseline 11/26/20: Pt currently uses power wheelchair to negotiate threshold into home.   12/23/20: Deferred.  01/15/21: Deferred.   02/10/21: Performed last week with step-over single line on floor with negligible height.    03/17/21: Performed over doorway threshold in clinic today with intermittent CGA and no LOB.    Time 16    Period Weeks    Status Partially Met    Target Date 04/10/21                   Plan - 04/21/21 1437     Clinical Impression Statement Patient is able  to perform  pulp to pulp grip with items c rounded edges and sharp corners, and she is able to perform simulated car transfer with negotiating 6-inch hurdle and performing 90-degree turn from seated position. She demonstrates safe negotiation of clinic with [place] and doorway threshold as well as uneven sidewalk in spite of intermittent L>R decreased toe clearance during gait. She is making slow improvements with gait and lower limb management during transfers. Patient has remaining deficits in increased tone of wrist/digit and elbow flexors (L>RUE), decreased L>R shoulder AROM, impaired trunk control, decreased LE strength and motor recruitment, gait deficits/deviations (most notably difficulty with L>R toe clearance during gait that is more apparent with fatigue), full weight acceptance onto bilateral LE, impaired postural stability in standing, and bed mobility/bed transfer limitations. Patient will benefit from continued skilled therapeutic intervention to address the above deficits as needed for improved function and QoL.    Personal Factors and Comorbidities Age;Comorbidity 3+;Time since onset of injury/illness/exacerbation    Comorbidities GERD, pre-diabetes, obesity, gout, s/p C3-7 ACDF    Examination-Activity Limitations Bathing;Continence;Reach Overhead;Stairs;Bed Mobility;Dressing;Self Feeding;Stand;Hygiene/Grooming;Toileting;Transfers;Locomotion Level    Examination-Participation Restrictions Community Activity;Yard Work;Laundry;Cleaning;Shop    Stability/Clinical Decision Making Evolving/Moderate complexity    Rehab Potential Fair    PT Frequency Other (comment)   2-3x/week   PT Duration Other (comment)   16 weeks   PT Treatment/Interventions Electrical Stimulation;Gait training;Stair training;Functional mobility training;Therapeutic activities;Therapeutic exercise;Balance training;Neuromuscular re-education;Wheelchair mobility training;Patient/family education    PT Next Visit Plan Gait re-training, task  practice for transferring, LE strengthening, UE AROM and strengthening to assist with functional mobility and transfers. Weight-shifting and pre-gait activities with decreasing UE support.    PT Home Exercise Plan Pt continuing with home health exercises. Pt continuing with task practice for rolling, bridging, supine to sit transfers in bed; walking with supervision/decreasing level fo assist with FWW; reaching and grasping with LUE. 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, initial encounter [facility])     Problem List Patient Active Problem List   Diagnosis Date Noted   Generalized anxiety disorder 01/24/2021   Suspected deep tissue injury 10/25/2020   Wheelchair dependence 11/27/2019   Spasticity 11/27/2019   Hyperkalemia    Steroid-induced hyperglycemia    Prediabetes    Neurogenic bowel    Neurogenic bladder    Neuropathic pain    Quadriplegia ([place]) 08/31/2019   S/P cervical spinal fusion 08/28/2019   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 [place], [place], [address] Phone: [phone]   Fax:  [phone]  Name: JALEISA BROSE MRN: [id number] Date of Birth: 20-Nov-1940

## 2021-04-23 ENCOUNTER — Encounter: Payer: Self-pay | Admitting: Physical Therapy

## 2021-04-23 ENCOUNTER — Other Ambulatory Visit: Payer: Self-pay

## 2021-04-23 ENCOUNTER — Ambulatory Visit: Payer: Medicare PPO | Admitting: Physical Therapy

## 2021-04-23 DIAGNOSIS — R262 Difficulty in walking, not elsewhere classified: Secondary | ICD-10-CM | POA: Diagnosis not present

## 2021-04-23 DIAGNOSIS — S14123A 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, initial encounter: Secondary | ICD-10-CM

## 2021-04-23 DIAGNOSIS — M6281 Muscle weakness (generalized): Secondary | ICD-10-CM

## 2021-04-23 NOTE — Therapy (Signed)
[facility] [address] [place], [place], [address] Phone: [phone]   Fax:  [phone]  Physical Therapy Treatment/ Physical Therapy Progress Note/Re-certification    Dates of reporting period  03/17/21   to   04/23/21  Patient Details  Name: Meredith Roach MRN: [id number] Date of Birth: Nov 18, 1940 Referring Provider (PT): [name], MD   Encounter Date: 04/23/2021   PT End of Session - 04/23/21 1138     Visit Number 50    Number of Visits 60    Date for PT Re-Evaluation 03/18/21    Authorization Time Period Cert 7/98/92-05/02/39, last progress note 04/23/21; current auth 04/11/21-05/21/21    Authorization - Visit Number 4    Authorization - Number of Visits 12    Progress Note Due on Visit 27    PT Start Time 1136    PT Stop Time 1226    PT Time Calculation (min) 50 min    Equipment Utilized During Treatment Gait belt   Pt uses power wheelchair, clinic's front-wheeled walker utilized for gait   Activity Tolerance Patient tolerated treatment well    Behavior During Therapy WFL for tasks assessed/performed             Past Medical History:  Diagnosis Date   Arthritis    GERD (gastroesophageal reflux disease)    Gout    Neuromuscular disorder ([place])    Pre-diabetes     Past Surgical History:  Procedure Laterality Date   ANTERIOR CERVICAL DECOMPRESSION/DISCECTOMY FUSION 4 LEVELS N/A 08/28/2019   Procedure: ANTERIOR CERVICAL DECOMPRESSION/DISCECTOMY FUSION CERVICALTHREE-FOUR CERVICAL,FOUR-FIVE,CERVICAL FIVE-SIX,CERVICAL SIX-SEVEN.;  Surgeon: [name], MD;  Location: [place];  Service: Neurosurgery;  Laterality: N/A;  ANTERIOR   BACK SURGERY     CATARACT EXTRACTION W/PHACO Right 03/31/2018   Procedure: CATARACT EXTRACTION PHACO AND INTRAOCULAR LENS PLACEMENT ([place]);  Surgeon: [name], MD;  Location: [facility];  Service: Ophthalmology;  Laterality: Right;  [place] 00:39.7 CDE 4.35 Fluid Pack Lot # [id number]    CHOLECYSTECTOMY  1995   COLONOSCOPY WITH PROPOFOL N/A 06/07/2017   Procedure: COLONOSCOPY WITH PROPOFOL;  Surgeon: [name], MD;  Location: [facility] ENDOSCOPY;  Service: Endoscopy;  Laterality: N/A;   ESOPHAGOGASTRODUODENOSCOPY (EGD) WITH PROPOFOL N/A 06/07/2017   Procedure: ESOPHAGOGASTRODUODENOSCOPY (EGD) WITH PROPOFOL;  Surgeon: [name], MD;  Location: [facility] ENDOSCOPY;  Service: Endoscopy;  Laterality: N/A;   IR CATHETER TUBE CHANGE  05/10/2020   REDUCTION MAMMAPLASTY Bilateral 1994    There were no vitals filed for this visit.   Subjective Assessment - 04/23/21 1139     Subjective Patient reports she is doing well with PT progress. Patient reports using stick held by her spouse to perfom rolling and sidelying to sit; pt was able to pull up into sidelying on her L elbow with use of stick and her spouse helped her to complete sidelying to sit transfer. Patient reports 80% SANE score at this time. Patient reports picking up cell phone and and small plastic tops with her L hand yesterday. Patient reports she needs more work on core and abdominal strength. Patient reports she does want to be able to negotiate steps. Patient is copmliant with home exercise program.    Patient is accompained by: Family member   husband   Pertinent History Patient is a 81 year old female s/p incomplete SCI. She had incomplete SCI following fall - C4 [place] D (DOI: 08/26/19). Primary activity limitations c difficulty walking, transferring, and completing independent functional mobility  following incomplete SCI. She has undergone home health therapy for about one year. She and her husband reports she was able to transfer and ambulate with walker in home with PT walking beside her. Pt has practiced with front-wheel walker at home. 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 Patient reports using steady transfer aid for performing transfers chair to bed and W/C to standard chair. Her husband states that she  is able to stand with steady device and can stand with her legs against the bed. Pt has ramp to get in her home for her power wheelchair. Patient lives in one level home. Pt uses tub bench that she can utilize to slide into shower; her daughter/husband assists with bathing. Pt has Lucianne Lei that can accommodate her power chair at this time. Pt has concrete driveway to get up to her ramp. Pt has small threshold to get into her home - husband utilizes cardboard to get over threshold.    Limitations Walking;Standing;House hold activities;Other (comment)   transferring, bed mobility, bladder management   Patient Stated Goals to be able to walk more independently               OBJECTIVE      Posture FHRS posture in power chair and on edge of mat. Patient self-corrects posture in sitting edge of low mat and standing without cueing.    Gait Mild forward flexed posture onto FWW with decreased gait velocity, minimally decreased toe clearance with LLE that does worsen with successive steps and fatigue; decreased heel strike L>R    Upper extremity AROM Shoulder Flexion R 108,  L 86 deg  [Pt able to obtain R shoulder elevation WFL and L shoulder elevation to 90 deg in supine] Elbow extension:  R,-16, L -20  Elbow flexion [facility] bilaterally   Strength R/L 4/4- Shoulder flexion 4/4 Shoulder abduction 5/5- Biceps 4+/4 Triceps 4-/4- Hip flexion 5-/5- Hip abduction (seated)  5/5 Hip adduction 5-/5- Knee extension 4+/4+ Knee flexion 4-/4 Ankle Plantarflexion 4/5 Ankle Dorsiflexion     FUNCTIONAL TASKS Stand Pivot Transfer: Independent for power chair placement relative to low mat. CGA only needed transiently at top of sit to stand to guard posteriorly (prevent uncontrolled descent to power chair); Modified-independent for standing pivot with FWW and stand-to-sit.    Rolling: ModA for active roll to R side after dependent management of lower limbs and dependent LUE cross body adduction. Difficulty  with LUE cross-body adduction and use of L lower limb and trunk flexors/lateral flexors to perform R roll. Pt able to perform R roll with MinA with use of RUE pull to assist. Independent roll to L side.    Supine to/from sit: IND with moving lower limbs to edge of bed for supine to sit. MaxA for trunk for sidelying to sitting transfer. IND with sitting to supine with exception of MaxA required to manage lower limbs once patient assumes sidelying. Pt is IND with scooting once in hooklying position.   Dressing simulation with Theraband loop: with Red Theraband tied in loop, patient pulled band around bilateral feet and up to her thighs. MaxA for clearing band around heels on either side during donning. Pt able to simulate doffing clothes with Tband independently; pt prioritizes RUE with this task, but is able to utilize L upper limb to assist when prompted for independent doffing.               TREATMENT     Therapeutic Activities - patient education, repetitive task  practice for improved performance of daily functional activities e.g. transferring, bed mobility     *Re-assessment performed (see above and updated Goal section below)   Gait with FWW in clinic; SBA/supervision throughout gait [cueing for toe clearance]; 2 laps around gym   Ambulation through doorway threshold and along outside sidewalk with FWW, with 180-degree turn to return to gym; x 5 minutes (pt negotiated threshold x 2)   Bed mobility task practice: Performed bilateral rolling on low mat; x 1 with no UE pull to assist and x1 with R upper limb to pull into either position (minimal pull required to perform roll);  Ambulation over exercise mat on floor to simulate negotiating change in surface elevation and uneven ground; performed x 1 with FWW, contact-guard assist       *not today* Car transfer simulation with lifting lower limbs over 6-inch hurdle along corner of table (transferring lower limbs over hurdle with  90-degree turn in sitting); performed x 1 over and back Performance of supine to/from sit with pt able to assume partial sidelying position onto L forearm  Lower trunk rotations with bilateral lower limbs in hooklying  with dowel hold c bilat UE; for trunk control during rolling and re-positioning in bed; 1x12 alternating Alternating bent-knee fallout in hooklying with dowel hold bilat UE; for trunk control during rolling and bed mobility tasks; 1x15 alternating Sit to stand from raised table, 2x10 with table height, 21-inches for height of table Seated toe tapping to Airex pad; x25 alternating to simulate demands of LE management/seated hip and knee flexion needed for dressing/self-care and car transfer Dressing simulation with Theraband loop (see above); x 4 minutes Bed mobility task practice: Performed bilateral rolling on low mat with Independent roll to L x 5; roll to R x 5 [MaxA to initiate LUE cross-body shoulder adduction and to facilitate truncal rotation via pelvis, pt able to complete last 50% of roll with Minimal assist] Bridging; x25 as needed for scooting and re-positioning pelvis on bed          Neuromuscular Re-education - for increased motor recruitment of major muscle groups with motor impairment, nervous system priming and exercises to promote trunk stability        *next visit* Adjacent to low mat while standing in FWW: Standing lateral toe tap to Airex pad; 1x12 and 1x10, performed on R and L side [simulating demands of lifting legs for bed transfers and car transfer] Standing toe tapping onto 6-inch step to promote active dorsiflexion during LE swing and bilateral weight shift and weight acceptance; 2x15 bilateral LE Standing reaching and grasping with small cubes of varying size, cylinders, foam balls (in "Hand PT packet"); grasping and placing into adjacent container on raised tray table, also performed with stacking 5 cones with L upper limb; x 5 minutes (emphasis on  LUE, minimal deficit with reaching/grasping RUE)   *not today* Supine alternating knee extension and shoulder elevation (dying bug technique) from hooklying position; 2x12 alternating  Sidelying clamshell with tactile cueing at trunk for maintaining sidelying position, to facilitate active lower limb lifting in sidelying position as needed for independent sitting to supine/supine to sit transfers; x20 on each side Sidestep in FWW along length of parallel bars; 1x D/B with CGA Hurdle step over 2-lb ankle weight on floor to simulate motor control and coordination demands for clearing threshold or minor change in surface height; 2x10 each LE Standing toe tapping onto 6-inch step to promote active dorsiflexion during LE swing and bilateral weight shift  and weight acceptance; x20 bilateral LE Reaching toward sticky-notes on backside of rolling mirror, 3 targets, with no UE support; 2x10 with bilateral UE [lower targets for L upper limb] Standing march with R upper extremity support only on single parallel bar, 1x10 alternating [full clearance of RLE, unweighting of LLE with inconsistent clearance from floor] Minisquat at edge of table; 2x10 with therapist guarding posteriorly in case of loss of descending control Unilateral upper extremity reaching and grasping in standing position with minimal UE support, with bilateral fine motor task, pulp to pulp grasp for metal nuts, moving to adjacent container requiring cross-body reaching; x 2 min Standing heel-to-toe step over blue line; alternating LE; 2x10 alternating            ASSESSMENT Patient has improved independence with negotiating doorway threshold and change in surface elevation. She demonstrates independent rolling to left and is able to perform R roll with ModA and L roll ModI with pulling c R upper limb (practiced utilizing R upper limb to assist with rolling for carryover at home - using bed railing, bed ladder, or dowel/rod held by caregiver).  She has previously met FOTO score, but she does report lower number today (still demonstrating relative improvement compared to initial evaluation greater than MCID). Pt is able to ambulate with bilateral toe clearance, though L toe clearance does decrease following fatigue and higher volume of walking. Over last 4 weeks, pt has demonstrated sufficient skills for transferring into small SUV [facility]) such as managing lower limbs in sitting over hurdle to simulate transferring into passenger seat and independently pivoting and sitting onto 26-inch seat. Pt has mostly met her stand-pivot transfer goal and has performed this task without assist from therapist; however, guarding posteriorly is intermittently needed due to impaired trunk control and concern for potential uncontrolled descent back to power chair during sit to stand. Pt has made substantial progress with transferring, positioning in wheelchair and in sitting on edge of bed, trunk control, gait, and negotiating small obstacles during gait. Patient has remaining deficits in increased tone of wrist/digit and elbow flexors (L>RUE), decreased L>R shoulder AROM, impaired trunk control, decreased LE strength and motor recruitment, gait deficits/deviations, impaired postural stability in standing, and bed mobility/bed transfer limitations. Patient will benefit from continued skilled therapeutic intervention to address the above deficits as needed for improved function and QoL.      PT Short Term Goals - 04/23/21 1254       PT SHORT TERM GOAL #1   Title Patient will perform stand-pivot transfer from chair to bed with supervision level of assist and ModI setup of power chair as needed for transferring at home    Baseline 11/26/20: Max assist for setup and mod assist to perform sit to stand, unsteady pivot prior to performing stand to sit.    12/23/20: Patient is able to setup power chair for transfer ModI. Mod to Max assist for sit to stand. CGA level of  assist for standing pivot and stand to sit on low mat.   01/15/21: Setup of power chair without assist; modified independent pivot and stand-to-sit; ModA for initial set to stand prior to pivot.  02/10/21: ModI setup of power chair, ModA for initiation of sit to stand, modified-independent stand pivot transfer.     03/17/21: Performed with CGA during sit to stand only and supervision level of assist for remainder of stand-pivot transfer (performing from power chair to table).   04/23/21: Pt performed stand-pivot transfer from power chair to low  mat with CGA only needed during sit to stand to guard posteriorly.    Time 4    Period Weeks    Status Partially Met    Target Date 04/07/21      PT SHORT TERM GOAL #2   Title Patient will perform standing toe tap to 3-inch surface (Airex pad) with bilateral upper extremity support indicative of improved ability to clear feet from floor during stepping and improved weight shift to each LE with AD use    Baseline 11/26/20: Limited toe clearance during bilateral LE swing phase with front-wheeled walker.   12/23/20: performed at previous follow-up with bilateral UE support.    Time 3    Period Weeks    Status Achieved    Target Date 12/17/20               PT Long Term Goals - 04/23/21 1255       PT LONG TERM GOAL #1   Title Patient will demonstrate improved function as evidenced by a score of 35 on FOTO measure for full participation in activities at home and in the community.    Baseline 11/26/20: FOTO 12.   12/23/20: FOTO 24.    01/15/21: FOTO 23.   02/10/21: FOTO 38.   03/17/21: 49.   04/23/21: 30.    Time 16    Period Weeks    Status Partially Met   previously had score > 35   Target Date 06/04/21      PT LONG TERM GOAL #2   Title Patient will ambulate for 150 feet without LOB, with sufficient toe clearance to prevent loss of gait stability, and c proper AD placement with least-restrictive assistive device with supervision level of assist    Baseline  11/26/20: Gait x 40 feet with CGA.   12/23/20: CGA with dec toe clearance L>RLE.   01/15/21: Ambulated 160 feet without LOB, proper AD placement; good toe clearance with first 80 feet, but decrased L>R toe clearanc with fatigue.   02/10/21: Performed with FWW with good toe clearance with first 80 feet, decreasing L toe clearance with succesive steps.   03/17/21: Performed without LOB, no significant instability with bilateral UE support on front-wheel walker; decreased toe clearance during gait L>R.  04/23/21: Performed with no LOB, proper placement of FWW, and sufficient toe clearance to negotiate doorway threshold, sidewalk, and change in surface elevation.    Time 12    Period Weeks    Status Achieved    Target Date 04/10/21      PT LONG TERM GOAL #3   Title Patient will perform independent re-positioning in chair with use of closed-chain triceps on armrests as needed for independent wheelchair mobility with no verbal cueing or tactile cueing required    Baseline 11/26/20: Dependent re-positioning in wheelchair.   12/23/20: Performed with feet on fold-out platform of her power wheelchair IND.    Time 8    Period Weeks    Status Achieved    Target Date 12/23/20      PT LONG TERM GOAL #4   Title Patient will perform simulated step-over task for clearing doorway threshold with use of front-wheeled walker as needed for entering/exiting home with supervision level of assistance and no LOB    Baseline 11/26/20: Pt currently uses power wheelchair to negotiate threshold into home.   12/23/20: Deferred.  01/15/21: Deferred.   02/10/21: Performed last week with step-over single line on floor with negligible height.    03/17/21: Performed over  doorway threshold in clinic today with intermittent CGA and no LOB.  04/23/21: Performed today with supervision level of assist.    Time 16    Period Weeks    Status Achieved    Target Date 04/10/21                   Plan - 04/24/21 0855     Clinical Impression  Statement Patient has improved independence with negotiating doorway threshold and change in surface elevation. She demonstrates independent rolling to left and is able to perform R roll with ModA and L roll ModI with pulling c R upper limb (practiced utilizing R upper limb to assist with rolling for carryover at home - using bed railing, bed ladder, or dowel/rod held by caregiver). She has previously met FOTO score, but she does report lower number today (still demonstrating relative improvement compared to initial evaluation greater than MCID). Pt is able to ambulate with bilateral toe clearance, though L toe clearance does decrease following fatigue and higher volume of walking. Over last 4 weeks, pt has demonstrated sufficient skills for transferring into small SUV [facility]) such as managing lower limbs in sitting over hurdle to simulate transferring into passenger seat and independently pivoting and sitting onto 26-inch seat. Pt has mostly met her stand-pivot transfer goal and has performed this task without assist from therapist; however, guarding posteriorly is intermittently needed due to impaired trunk control and concern for potential uncontrolled descent back to power chair during sit to stand. Pt has made substantial progress with transferring, positioning in wheelchair and in sitting on edge of bed, trunk control, gait, and negotiating small obstacles during gait. Patient has remaining deficits in increased tone of wrist/digit and elbow flexors (L>RUE), decreased L>R shoulder AROM, impaired trunk control, decreased LE strength and motor recruitment, gait deficits/deviations, impaired postural stability in standing, and bed mobility/bed transfer limitations. Patient will benefit from continued skilled therapeutic intervention to address the above deficits as needed for improved function and QoL.    Personal Factors and Comorbidities Age;Comorbidity 3+;Time since onset of injury/illness/exacerbation     Comorbidities GERD, pre-diabetes, obesity, gout, s/p C3-7 ACDF    Examination-Activity Limitations Bathing;Continence;Reach Overhead;Stairs;Bed Mobility;Dressing;Self Feeding;Stand;Hygiene/Grooming;Toileting;Transfers;Locomotion Level    Examination-Participation Restrictions Community Activity;Yard Work;Laundry;Cleaning;Shop    Stability/Clinical Decision Making Evolving/Moderate complexity    Rehab Potential Fair    PT Frequency Other (comment)   2-3x/week   PT Duration Other (comment)   16 weeks   PT Treatment/Interventions Electrical Stimulation;Gait training;Stair training;Functional mobility training;Therapeutic activities;Therapeutic exercise;Balance training;Neuromuscular re-education;Wheelchair mobility training;Patient/family education    PT Next Visit Plan Gait re-training with FWW and facilitating improved independence with AD, task practice for transferring, LE strengthening, UE AROM and strengthening to assist with functional mobility and transfers.    PT Home Exercise Plan Pt continuing with home health exercises. Pt continuing with task practice for rolling, bridging, supine to sit transfers in bed; walking with supervision/decreasing level fo assist with FWW; reaching and grasping with LUE. Recommend continued PT 2-3x/week for 6 weeks    Consulted and Agree with Plan of Care Patient;Family member/caregiver    Family Member Consulted Husband             Patient will benefit from skilled therapeutic intervention in order to improve the following deficits and impairments:  Abnormal gait, Decreased balance, Decreased mobility, Impaired sensation, Decreased strength  Visit Diagnosis: Difficulty in walking, not elsewhere classified  Muscle weakness (generalized)  Central cord syndrome at C3 level of cervical spinal  cord, initial encounter [facility])     Problem List Patient Active Problem List   Diagnosis Date Noted   Generalized anxiety disorder 01/24/2021   Suspected  deep tissue injury 10/25/2020   Wheelchair dependence 11/27/2019   Spasticity 11/27/2019   Hyperkalemia    Steroid-induced hyperglycemia    Prediabetes    Neurogenic bowel    Neurogenic bladder    Neuropathic pain    Quadriplegia ([place]) 08/31/2019   S/P cervical spinal fusion 08/28/2019   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 [place], [place], [address] Phone: [phone]   Fax:  [phone]  Name: BERNEDA PICCININNI MRN: [id number] Date of Birth: 12-12-1940

## 2021-04-25 ENCOUNTER — Ambulatory Visit: Payer: Medicare PPO | Admitting: Physical Medicine and Rehabilitation

## 2021-04-27 NOTE — Progress Notes (Signed)
Suprapubic Cath Change  Patient is present today for a suprapubic catheter change due to urinary retention.  9 ml of water was drained from the balloon, a 16 FR foley cath was removed from the tract with out difficulty.  Site was cleaned and prepped in a sterile fashion with betadine.  A 16 FR foley cath was replaced into the tract no complications were noted. Urine return was noted, 10 ml of sterile water was inflated into the balloon and a night bag was attached for drainage.  Patient tolerated well.   Performed by: Zara Council, PA-C   Follow up: Catheter initially placed 08/2019.  Follow up one month for SPT exchange.

## 2021-04-28 ENCOUNTER — Ambulatory Visit: Payer: Medicare PPO | Admitting: Urology

## 2021-04-28 ENCOUNTER — Ambulatory Visit: Payer: Medicare PPO | Admitting: Physical Therapy

## 2021-04-28 ENCOUNTER — Encounter: Payer: Self-pay | Admitting: Urology

## 2021-04-28 ENCOUNTER — Other Ambulatory Visit: Payer: Self-pay

## 2021-04-28 VITALS — Ht 63.0 in | Wt 200.0 lb

## 2021-04-28 DIAGNOSIS — R262 Difficulty in walking, not elsewhere classified: Secondary | ICD-10-CM | POA: Diagnosis not present

## 2021-04-28 DIAGNOSIS — Z9359 Other cystostomy status: Secondary | ICD-10-CM | POA: Diagnosis not present

## 2021-04-28 DIAGNOSIS — S14123A 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, initial encounter: Secondary | ICD-10-CM

## 2021-04-28 DIAGNOSIS — M6281 Muscle weakness (generalized): Secondary | ICD-10-CM

## 2021-04-28 NOTE — Therapy (Signed)
[facility] [address] [place], [place], [address] Phone: [phone]   Fax:  [phone]  Physical Therapy Treatment  Patient Details  Name: Meredith Roach MRN: [id number] Date of Birth: 1940/09/20 Referring Provider (PT): [name], MD   Encounter Date: 04/28/2021   PT End of Session - 04/28/21 1322     Visit Number 51    Number of Visits 60    Date for PT Re-Evaluation 03/18/21    Authorization Time Period Cert 1/77/93-12/15/98, last progress note 04/23/21; current auth 04/11/21-05/21/21    Authorization - Visit Number 5    Authorization - Number of Visits 12    Progress Note Due on Visit 63    PT Start Time 1057    PT Stop Time 1147    PT Time Calculation (min) 50 min    Equipment Utilized During Treatment Gait belt   Pt uses power wheelchair, clinic's front-wheeled walker utilized for gait   Activity Tolerance Patient tolerated treatment well    Behavior During Therapy WFL for tasks assessed/performed             Past Medical History:  Diagnosis Date   Arthritis    GERD (gastroesophageal reflux disease)    Gout    Neuromuscular disorder ([place])    Pre-diabetes     Past Surgical History:  Procedure Laterality Date   ANTERIOR CERVICAL DECOMPRESSION/DISCECTOMY FUSION 4 LEVELS N/A 08/28/2019   Procedure: ANTERIOR CERVICAL DECOMPRESSION/DISCECTOMY FUSION CERVICALTHREE-FOUR CERVICAL,FOUR-FIVE,CERVICAL FIVE-SIX,CERVICAL SIX-SEVEN.;  Surgeon: [name], MD;  Location: [place];  Service: Neurosurgery;  Laterality: N/A;  ANTERIOR   BACK SURGERY     CATARACT EXTRACTION W/PHACO Right 03/31/2018   Procedure: CATARACT EXTRACTION PHACO AND INTRAOCULAR LENS PLACEMENT ([place]);  Surgeon: [name], MD;  Location: [facility];  Service: Ophthalmology;  Laterality: Right;  [place] 00:39.7 CDE 4.35 Fluid Pack Lot # [id number]   CHOLECYSTECTOMY  1995   COLONOSCOPY WITH PROPOFOL N/A 06/07/2017   Procedure: COLONOSCOPY WITH PROPOFOL;   Surgeon: [name], MD;  Location: [facility] ENDOSCOPY;  Service: Endoscopy;  Laterality: N/A;   ESOPHAGOGASTRODUODENOSCOPY (EGD) WITH PROPOFOL N/A 06/07/2017   Procedure: ESOPHAGOGASTRODUODENOSCOPY (EGD) WITH PROPOFOL;  Surgeon: [name], MD;  Location[facility];  Service: Endoscopy;  Laterality: N/A;   IR CATHETER TUBE CHANGE  05/10/2020   REDUCTION MAMMAPLASTY Bilateral 1994    There were no vitals filed for this visit.   Subjective Assessment - 04/28/21 1326     Subjective Patient reports experiencing episode of hypersensitivity affecting lateral aspect of L foot along 4th-5th metatarsals and up her leg/thigh laterally. She reports that the bedsheet touching her leg was bothering her, though this has gotten better since. She reports continued use of Gabapentin for neuropathic pain management. Pt reports that stretching her leg/ankle on L side did help with this. Her daughter also massaged her L leg. She reports compliant with home program with exception of missing some exercise this past weekend due to this issue.    Patient is accompained by: Family member   husband   Pertinent History Patient is a 81 year old female s/p incomplete SCI. She had incomplete SCI following fall - C4 [place] D (DOI: 08/26/19). Primary activity limitations c difficulty walking, transferring, and completing independent functional mobility following incomplete SCI. She has undergone home health therapy for about one year. She and her husband reports she was able to transfer and ambulate with walker in home with PT walking beside her. Pt has practiced  with front-wheel walker at home. 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 Patient reports using steady transfer aid for performing transfers chair to bed and W/C to standard chair. Her husband states that she is able to stand with steady device and can stand with her legs against the bed. Pt has ramp to get in her home for her power wheelchair. Patient  lives in one level home. Pt uses tub bench that she can utilize to slide into shower; her daughter/husband assists with bathing. Pt has Lucianne Lei that can accommodate her power chair at this time. Pt has concrete driveway to get up to her ramp. Pt has small threshold to get into her home - husband utilizes cardboard to get over threshold.    Limitations Walking;Standing;House hold activities;Other (comment)   transferring, bed mobility, bladder management   Patient Stated Goals to be able to walk more independently                    TREATMENT     Therapeutic Activities - patient education, repetitive task practice for improved performance of daily functional activities e.g. transferring, bed mobility      Gait with FWW in clinic; SBA/supervision throughout gait [cueing for toe clearance]; 2 laps around gym   Ambulation over exercise mat on floor to simulate negotiating change in surface elevation and uneven ground, subsequent through doorway threshold and along outside sidewalk with FWW, with 90-degree turn on sidewalk and then returning back to gym; x 6 minutes (pt negotiated threshold x 2)  Stand-pivot transfer from chair to low mat with posterior CGA only during sit to stand, otherwise performed with supervision level of assist only  Pt edu: Reviewed briefly technique for desensitization and discussed following up with MD to discuss medical management for neuropathic pain       *not today* Bed mobility task practice: Performed bilateral rolling on low mat; x 1 with no UE pull to assist and x1 with R upper limb to pull into either position (minimal pull required to perform roll); Car transfer simulation with lifting lower limbs over 6-inch hurdle along corner of table (transferring lower limbs over hurdle with 90-degree turn in sitting); performed x 1 over and back Performance of supine to/from sit with pt able to assume partial sidelying position onto L forearm  Lower trunk rotations  with bilateral lower limbs in hooklying  with dowel hold c bilat UE; for trunk control during rolling and re-positioning in bed; 1x12 alternating Alternating bent-knee fallout in hooklying with dowel hold bilat UE; for trunk control during rolling and bed mobility tasks; 1x15 alternating Sit to stand from raised table, 2x10 with table height, 21-inches for height of table Seated toe tapping to Airex pad; x25 alternating to simulate demands of LE management/seated hip and knee flexion needed for dressing/self-care and car transfer Dressing simulation with Theraband loop (see above); x 4 minutes Bed mobility task practice: Performed bilateral rolling on low mat with Independent roll to L x 5; roll to R x 5 [MaxA to initiate LUE cross-body shoulder adduction and to facilitate truncal rotation via pelvis, pt able to complete last 50% of roll with Minimal assist] Bridging; x25 as needed for scooting and re-positioning pelvis on bed          Neuromuscular Re-education - for increased motor recruitment of major muscle groups with motor impairment, nervous system priming and exercises to promote trunk stability       Adjacent to low mat while standing  in FWW: Standing toe tapping onto 6-inch step to promote active dorsiflexion during LE swing and bilateral weight shift and weight acceptance; 1x15 and 1x10 bilateral LE Standing reaching and grasping with small cubes of varying size, cylinders, foam balls (in "Hand PT packet"); grasping and placing into adjacent container on raised tray table, also performed with stacking 5 cones with L upper limb; x 5 minutes (emphasis on LUE, minimal deficit with reaching/grasping RUE) Reaching toward sticky-notes on backside of rolling mirror, 3 targets, with no UE support; x 5 minutes [lower targets for L upper limb]    *not today* Standing lateral toe tap to Airex pad; 2x12 performed on R and L side [simulating demands of lifting legs for bed transfers and car  transfer] Supine alternating knee extension and shoulder elevation (dying bug technique) from hooklying position; 2x12 alternating  Sidelying clamshell with tactile cueing at trunk for maintaining sidelying position, to facilitate active lower limb lifting in sidelying position as needed for independent sitting to supine/supine to sit transfers; x20 on each side Sidestep in FWW along length of parallel bars; 1x D/B with CGA Hurdle step over 2-lb ankle weight on floor to simulate motor control and coordination demands for clearing threshold or minor change in surface height; 2x10 each LE Standing toe tapping onto 6-inch step to promote active dorsiflexion during LE swing and bilateral weight shift and weight acceptance; x20 bilateral LE Standing march with R upper extremity support only on single parallel bar, 1x10 alternating [full clearance of RLE, unweighting of LLE with inconsistent clearance from floor] Minisquat at edge of table; 2x10 with therapist guarding posteriorly in case of loss of descending control Unilateral upper extremity reaching and grasping in standing position with minimal UE support, with bilateral fine motor task, pulp to pulp grasp for metal nuts, moving to adjacent container requiring cross-body reaching; x 2 min Standing heel-to-toe step over blue line; alternating LE; 2x10 alternating            ASSESSMENT Patient demonstrates improved toe clearance (more significant motor deficit on L side) during gait, change in surface elevation, and with gait on exercise mat to simulate walking on compliant/uneven surface. Patient demonstrates ongoing improvement with open-chain hip flexion required for clearing lower limbs over obstacles and transfers (e.g. car transfer, managing lower limbs during supine to sit). She does have intermittent hypersensitivity and neuroalgia affecting L>RLE; reviewed technique for desensitization and recommended f/u with physician regarding medical  management. Patient has remaining deficits in increased tone of wrist/digit and elbow flexors (L>RUE), decreased L>R shoulder AROM, impaired trunk control, decreased LE strength and motor recruitment, gait deficits/deviations, impaired postural stability in standing, and bed mobility/bed transfer limitations. Patient will benefit from continued skilled therapeutic intervention to address the above deficits as needed for improved function and QoL.        PT Short Term Goals - 04/23/21 1254       PT SHORT TERM GOAL #1   Title Patient will perform stand-pivot transfer from chair to bed with supervision level of assist and ModI setup of power chair as needed for transferring at home    Baseline 11/26/20: Max assist for setup and mod assist to perform sit to stand, unsteady pivot prior to performing stand to sit.    12/23/20: Patient is able to setup power chair for transfer ModI. Mod to Max assist for sit to stand. CGA level of assist for standing pivot and stand to sit on low mat.   01/15/21: Setup of power chair  without assist; modified independent pivot and stand-to-sit; ModA for initial set to stand prior to pivot.  02/10/21: ModI setup of power chair, ModA for initiation of sit to stand, modified-independent stand pivot transfer.     03/17/21: Performed with CGA during sit to stand only and supervision level of assist for remainder of stand-pivot transfer (performing from power chair to table).   04/23/21: Pt performed stand-pivot transfer from power chair to low mat with CGA only needed during sit to stand to guard posteriorly.    Time 4    Period Weeks    Status Partially Met    Target Date 04/07/21      PT SHORT TERM GOAL #2   Title Patient will perform standing toe tap to 3-inch surface (Airex pad) with bilateral upper extremity support indicative of improved ability to clear feet from floor during stepping and improved weight shift to each LE with AD use    Baseline 11/26/20: Limited toe clearance  during bilateral LE swing phase with front-wheeled walker.   12/23/20: performed at previous follow-up with bilateral UE support.    Time 3    Period Weeks    Status Achieved    Target Date 12/17/20               PT Long Term Goals - 04/23/21 1255       PT LONG TERM GOAL #1   Title Patient will demonstrate improved function as evidenced by a score of 35 on FOTO measure for full participation in activities at home and in the community.    Baseline 11/26/20: FOTO 12.   12/23/20: FOTO 24.    01/15/21: FOTO 23.   02/10/21: FOTO 38.   03/17/21: 49.   04/23/21: 30.    Time 16    Period Weeks    Status Partially Met   previously had score > 35   Target Date 06/04/21      PT LONG TERM GOAL #2   Title Patient will ambulate for 150 feet without LOB, with sufficient toe clearance to prevent loss of gait stability, and c proper AD placement with least-restrictive assistive device with supervision level of assist    Baseline 11/26/20: Gait x 40 feet with CGA.   12/23/20: CGA with dec toe clearance L>RLE.   01/15/21: Ambulated 160 feet without LOB, proper AD placement; good toe clearance with first 80 feet, but decrased L>R toe clearanc with fatigue.   02/10/21: Performed with FWW with good toe clearance with first 80 feet, decreasing L toe clearance with succesive steps.   03/17/21: Performed without LOB, no significant instability with bilateral UE support on front-wheel walker; decreased toe clearance during gait L>R.  04/23/21: Performed with no LOB, proper placement of FWW, and sufficient toe clearance to negotiate doorway threshold, sidewalk, and change in surface elevation.    Time 12    Period Weeks    Status Achieved    Target Date 04/10/21      PT LONG TERM GOAL #3   Title Patient will perform independent re-positioning in chair with use of closed-chain triceps on armrests as needed for independent wheelchair mobility with no verbal cueing or tactile cueing required    Baseline 11/26/20: Dependent  re-positioning in wheelchair.   12/23/20: Performed with feet on fold-out platform of her power wheelchair IND.    Time 8    Period Weeks    Status Achieved    Target Date 12/23/20      PT LONG TERM  GOAL #4   Title Patient will perform simulated step-over task for clearing doorway threshold with use of front-wheeled walker as needed for entering/exiting home with supervision level of assistance and no LOB    Baseline 11/26/20: Pt currently uses power wheelchair to negotiate threshold into home.   12/23/20: Deferred.  01/15/21: Deferred.   02/10/21: Performed last week with step-over single line on floor with negligible height.    03/17/21: Performed over doorway threshold in clinic today with intermittent CGA and no LOB.  04/23/21: Performed today with supervision level of assist.    Time 16    Period Weeks    Status Achieved    Target Date 04/10/21                   Plan - 04/28/21 1438     Clinical Impression Statement Patient demonstrates improved toe clearance (more significant motor deficit on L side) during gait, change in surface elevation, and with gait on exercise mat to simulate walking on compliant/uneven surface. Patient demonstrates ongoing improvement with open-chain hip flexion required for clearing lower limbs over obstacles and transfers (e.g. car transfer, managing lower limbs during supine to sit). She does have intermittent hypersensitivity and neuroalgia affecting L>RLE; reviewed technique for desensitization and recommended f/u with physician regarding medical management. Patient has remaining deficits in increased tone of wrist/digit and elbow flexors (L>RUE), decreased L>R shoulder AROM, impaired trunk control, decreased LE strength and motor recruitment, gait deficits/deviations, impaired postural stability in standing, and bed mobility/bed transfer limitations. Patient will benefit from continued skilled therapeutic intervention to address the above deficits as needed  for improved function and QoL.    Personal Factors and Comorbidities Age;Comorbidity 3+;Time since onset of injury/illness/exacerbation    Comorbidities GERD, pre-diabetes, obesity, gout, s/p C3-7 ACDF    Examination-Activity Limitations Bathing;Continence;Reach Overhead;Stairs;Bed Mobility;Dressing;Self Feeding;Stand;Hygiene/Grooming;Toileting;Transfers;Locomotion Level    Examination-Participation Restrictions Community Activity;Yard Work;Laundry;Cleaning;Shop    Stability/Clinical Decision Making Evolving/Moderate complexity    Rehab Potential Fair    PT Frequency Other (comment)   2-3x/week   PT Duration Other (comment)   16 weeks   PT Treatment/Interventions Electrical Stimulation;Gait training;Stair training;Functional mobility training;Therapeutic activities;Therapeutic exercise;Balance training;Neuromuscular re-education;Wheelchair mobility training;Patient/family education    PT Next Visit Plan Gait re-training with FWW and facilitating improved independence with AD, task practice for transferring, LE strengthening, UE AROM and strengthening to assist with functional mobility and transfers.    PT Home Exercise Plan Pt continuing with home health exercises. Pt continuing with task practice for rolling, bridging, supine to sit transfers in bed; walking with supervision/decreasing level fo assist with FWW; reaching and grasping with LUE. 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, initial encounter  [facility])     Problem List Patient Active Problem List   Diagnosis Date Noted   Generalized anxiety disorder 01/24/2021   Suspected deep tissue injury 10/25/2020   Wheelchair dependence 11/27/2019   Spasticity 11/27/2019   Hyperkalemia    Steroid-induced hyperglycemia    Prediabetes    Neurogenic bowel  Neurogenic bladder    Neuropathic pain    Quadriplegia ([place]) 08/31/2019   S/P cervical spinal fusion 08/28/2019   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 [place], [place], [address] Phone: [phone]   Fax:  [phone]  Name: Meredith Roach MRN: [id number] Date of Birth: May 14, 1940

## 2021-04-30 ENCOUNTER — Ambulatory Visit: Payer: Medicare PPO | Admitting: Physical Therapy

## 2021-04-30 ENCOUNTER — Other Ambulatory Visit: Payer: Self-pay

## 2021-04-30 DIAGNOSIS — R262 Difficulty in walking, not elsewhere classified: Secondary | ICD-10-CM

## 2021-04-30 DIAGNOSIS — S14123A 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, initial encounter: Secondary | ICD-10-CM

## 2021-04-30 DIAGNOSIS — M6281 Muscle weakness (generalized): Secondary | ICD-10-CM

## 2021-04-30 NOTE — Therapy (Signed)
[facility] [address] [place], [place], [address] Phone: [phone]   Fax:  [phone]  Physical Therapy Treatment  Patient Details  Name: Meredith Roach MRN: [id number] Date of Birth: [birth year] Referring Provider (PT): [name], MD   Encounter Date: 04/30/2021   PT End of Session - 04/30/21 1139     Visit Number 52    Number of Visits 60    Date for PT Re-Evaluation 03/18/21    Authorization Time Period Cert 9/50/93-2/67/12, last progress note 04/23/21; current auth 04/11/21-05/21/21    Authorization - Visit Number 6    Authorization - Number of Visits 12    Progress Note Due on Visit 59    PT Start Time 1142    PT Stop Time 1230    PT Time Calculation (min) 48 min    Equipment Utilized During Treatment Gait belt   Pt uses power wheelchair, clinic's front-wheeled walker utilized for gait   Activity Tolerance Patient tolerated treatment well    Behavior During Therapy WFL for tasks assessed/performed             Past Medical History:  Diagnosis Date   Arthritis    GERD (gastroesophageal reflux disease)    Gout    Neuromuscular disorder ([place])    Pre-diabetes     Past Surgical History:  Procedure Laterality Date   ANTERIOR CERVICAL DECOMPRESSION/DISCECTOMY FUSION 4 LEVELS N/A 08/28/2019   Procedure: ANTERIOR CERVICAL DECOMPRESSION/DISCECTOMY FUSION CERVICALTHREE-FOUR CERVICAL,FOUR-FIVE,CERVICAL FIVE-SIX,CERVICAL SIX-SEVEN.;  Surgeon: [name], MD;  Location: [place];  Service: Neurosurgery;  Laterality: N/A;  ANTERIOR   BACK SURGERY     CATARACT EXTRACTION W/PHACO Right 03/31/2018   Procedure: CATARACT EXTRACTION PHACO AND INTRAOCULAR LENS PLACEMENT ([place]);  Surgeon: [name], MD;  Location: [facility];  Service: Ophthalmology;  Laterality: Right;  [place] 00:39.7 CDE 4.35 Fluid Pack Lot # [id number]   CHOLECYSTECTOMY  1995   COLONOSCOPY WITH PROPOFOL N/A 06/07/2017   Procedure: COLONOSCOPY WITH PROPOFOL;   Surgeon: [name], MD;  Location: [facility] ENDOSCOPY;  Service: Endoscopy;  Laterality: N/A;   ESOPHAGOGASTRODUODENOSCOPY (EGD) WITH PROPOFOL N/A 06/07/2017   Procedure: ESOPHAGOGASTRODUODENOSCOPY (EGD) WITH PROPOFOL;  Surgeon: [name], MD;  Location: [facility] ENDOSCOPY;  Service: Endoscopy;  Laterality: N/A;   IR CATHETER TUBE CHANGE  05/10/2020   REDUCTION MAMMAPLASTY Bilateral 1994    There were no vitals filed for this visit.   Subjective Assessment - 04/30/21 1420     Subjective Pt reports constant paresthesias in L lower limb, but she reports decreased hypersensitiviy compared to this past weekend. She reports tolerating last visit well. Patient reports no other major changes this afternoon.    Patient is accompained by: Family member   husband   Pertinent History Patient is a 81 year old female s/p incomplete SCI. She had incomplete SCI following fall - C4 [place] D (DOI: 08/26/19). Primary activity limitations c difficulty walking, transferring, and completing independent functional mobility following incomplete SCI. She has undergone home health therapy for about one year. She and her husband reports she was able to transfer and ambulate with walker in home with PT walking beside her. Pt has practiced with front-wheel walker at home. 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 Patient reports using steady transfer aid for performing transfers chair to bed and W/C to standard chair. Her husband states that she is able to stand with steady device and can stand with her legs against the bed. Pt has  ramp to get in her home for her power wheelchair. Patient lives in one level home. Pt uses tub bench that she can utilize to slide into shower; her daughter/husband assists with bathing. Pt has [employer] Lei that can accommodate her power chair at this time. Pt has concrete driveway to get up to her ramp. Pt has small threshold to get into her home - husband utilizes cardboard to get over  threshold.    Limitations Walking;Standing;House hold activities;Other (comment)   transferring, bed mobility, bladder management   Patient Stated Goals to be able to walk more independently                TREATMENT     Therapeutic Activities - patient education, repetitive task practice for improved performance of daily functional activities e.g. transferring, bed mobility   Bed mobility task practice: Performed bilateral rolling on low mat; x 1 with no UE pull to assist and x5 with R upper limb to pull into either position (minimal pull required to perform roll);  Sidelying trunk lift, sidelying on L side; x5    Ambulation through doorway threshold and along outside sidewalk with FWW, with 90-degree turn on sidewalk R and L and negotiation of low-grade incline/decline and then returning back to gym; x 10 minutes        *not today* Ambulation over exercise mat on floor to simulate negotiating change in surface elevation and uneven ground, subsequent through doorway threshold and along outside sidewalk with FWW, with 90-degree turn on sidewalk and then returning back to gym; x 6 minutes (pt negotiated threshold x 2) Car transfer simulation with lifting lower limbs over 6-inch hurdle along corner of table (transferring lower limbs over hurdle with 90-degree turn in sitting); performed x 1 over and back Performance of supine to/from sit with pt able to assume partial sidelying position onto L forearm  Lower trunk rotations with bilateral lower limbs in hooklying  with dowel hold c bilat UE; for trunk control during rolling and re-positioning in bed; 1x12 alternating Alternating bent-knee fallout in hooklying with dowel hold bilat UE; for trunk control during rolling and bed mobility tasks; 1x15 alternating Sit to stand from raised table, 2x10 with table height, 21-inches for height of table Seated toe tapping to Airex pad; x25 alternating to simulate demands of LE management/seated hip  and knee flexion needed for dressing/self-care and car transfer Dressing simulation with Theraband loop (see above); x 4 minutes Bed mobility task practice: Performed bilateral rolling on low mat with Independent roll to L x 5; roll to R x 5 [MaxA to initiate LUE cross-body shoulder adduction and to facilitate truncal rotation via pelvis, pt able to complete last 50% of roll with Minimal assist] Bridging; x25 as needed for scooting and re-positioning pelvis on bed          Neuromuscular Re-education - for increased motor recruitment of major muscle groups with motor impairment, nervous system priming and exercises to promote trunk stability       Adjacent to low mat while standing in FWW: Standing lateral toe tap to Airex pad; 2x10; performed on R and L side [simulating demands of lifting legs for bed transfers and car transfer]  Standing reaching and grasping with small cubes of varying size, cylinders, foam balls (in "Hand PT packet"); grasping and placing into adjacent container on raised treatment table; x 5 minutes (emphasis on LUE, minimal deficit with reaching/grasping RUE)      *not today* Reaching toward sticky-notes on backside of  rolling mirror, 3 targets, with no UE support; x 5 minutes [lower targets for L upper limb] Standing toe tapping onto 6-inch step to promote active dorsiflexion during LE swing and bilateral weight shift and weight acceptance; 1x15 and 1x10 bilateral LE Supine alternating knee extension and shoulder elevation (dying bug technique) from hooklying position; 2x12 alternating  Sidelying clamshell with tactile cueing at trunk for maintaining sidelying position, to facilitate active lower limb lifting in sidelying position as needed for independent sitting to supine/supine to sit transfers; x20 on each side Sidestep in FWW along length of parallel bars; 1x D/B with CGA Hurdle step over 2-lb ankle weight on floor to simulate motor control and coordination demands  for clearing threshold or minor change in surface height; 2x10 each LE Standing toe tapping onto 6-inch step to promote active dorsiflexion during LE swing and bilateral weight shift and weight acceptance; x20 bilateral LE Standing march with R upper extremity support only on single parallel bar, 1x10 alternating [full clearance of RLE, unweighting of LLE with inconsistent clearance from floor] Minisquat at edge of table; 2x10 with therapist guarding posteriorly in case of loss of descending control Unilateral upper extremity reaching and grasping in standing position with minimal UE support, with bilateral fine motor task, pulp to pulp grasp for metal nuts, moving to adjacent container requiring cross-body reaching; x 2 min Standing heel-to-toe step over blue line; alternating LE; 2x10 alternating            ASSESSMENT Patient demonstrates significantly improved performance with L upper limb reaching and grasping today with decreasing frequency of dropping items during spherical grip and pulp-to-pulp grasp. She does have limited dexterity of L hand which may plateau given level of SCI, advanced age, and length of time since injury. She is able to ambulate over sidewalk, asphalt, and low-grade incline/decline outside of the office building today. She has ongoing bed mobility deficits associated with truncal weakness and limited upper limb strength. Patient will benefit from continued skilled therapeutic intervention per established POC for improved function and QoL.       PT Short Term Goals - 04/23/21 1254       PT SHORT TERM GOAL #1   Title Patient will perform stand-pivot transfer from chair to bed with supervision level of assist and ModI setup of power chair as needed for transferring at home    Baseline 11/26/20: Max assist for setup and mod assist to perform sit to stand, unsteady pivot prior to performing stand to sit.    12/23/20: Patient is able to setup power chair for transfer ModI. Mod  to Max assist for sit to stand. CGA level of assist for standing pivot and stand to sit on low mat.   01/15/21: Setup of power chair without assist; modified independent pivot and stand-to-sit; ModA for initial set to stand prior to pivot.  02/10/21: ModI setup of power chair, ModA for initiation of sit to stand, modified-independent stand pivot transfer.     03/17/21: Performed with CGA during sit to stand only and supervision level of assist for remainder of stand-pivot transfer (performing from power chair to table).   04/23/21: Pt performed stand-pivot transfer from power chair to low mat with CGA only needed during sit to stand to guard posteriorly.    Time 4    Period Weeks    Status Partially Met    Target Date 04/07/21      PT SHORT TERM GOAL #2   Title Patient will perform standing toe  tap to 3-inch surface (Airex pad) with bilateral upper extremity support indicative of improved ability to clear feet from floor during stepping and improved weight shift to each LE with AD use    Baseline 11/26/20: Limited toe clearance during bilateral LE swing phase with front-wheeled walker.   12/23/20: performed at previous follow-up with bilateral UE support.    Time 3    Period Weeks    Status Achieved    Target Date 12/17/20               PT Long Term Goals - 04/23/21 1255       PT LONG TERM GOAL #1   Title Patient will demonstrate improved function as evidenced by a score of 35 on FOTO measure for full participation in activities at home and in the community.    Baseline 11/26/20: FOTO 12.   12/23/20: FOTO 24.    01/15/21: FOTO 23.   02/10/21: FOTO 38.   03/17/21: 49.   04/23/21: 30.    Time 16    Period Weeks    Status Partially Met   previously had score > 35   Target Date 06/04/21      PT LONG TERM GOAL #2   Title Patient will ambulate for 150 feet without LOB, with sufficient toe clearance to prevent loss of gait stability, and c proper AD placement with least-restrictive assistive device  with supervision level of assist    Baseline 11/26/20: Gait x 40 feet with CGA.   12/23/20: CGA with dec toe clearance L>RLE.   01/15/21: Ambulated 160 feet without LOB, proper AD placement; good toe clearance with first 80 feet, but decrased L>R toe clearanc with fatigue.   02/10/21: Performed with FWW with good toe clearance with first 80 feet, decreasing L toe clearance with succesive steps.   03/17/21: Performed without LOB, no significant instability with bilateral UE support on front-wheel walker; decreased toe clearance during gait L>R.  04/23/21: Performed with no LOB, proper placement of FWW, and sufficient toe clearance to negotiate doorway threshold, sidewalk, and change in surface elevation.    Time 12    Period Weeks    Status Achieved    Target Date 04/10/21      PT LONG TERM GOAL #3   Title Patient will perform independent re-positioning in chair with use of closed-chain triceps on armrests as needed for independent wheelchair mobility with no verbal cueing or tactile cueing required    Baseline 11/26/20: Dependent re-positioning in wheelchair.   12/23/20: Performed with feet on fold-out platform of her power wheelchair IND.    Time 8    Period Weeks    Status Achieved    Target Date 12/23/20      PT LONG TERM GOAL #4   Title Patient will perform simulated step-over task for clearing doorway threshold with use of front-wheeled walker as needed for entering/exiting home with supervision level of assistance and no LOB    Baseline 11/26/20: Pt currently uses power wheelchair to negotiate threshold into home.   12/23/20: Deferred.  01/15/21: Deferred.   02/10/21: Performed last week with step-over single line on floor with negligible height.    03/17/21: Performed over doorway threshold in clinic today with intermittent CGA and no LOB.  04/23/21: Performed today with supervision level of assist.    Time 16    Period Weeks    Status Achieved    Target Date 04/10/21  Plan - 04/30/21 1430     Clinical Impression Statement Patient demonstrates significantly improved performance with L upper limb reaching and grasping today with decreasing frequency of dropping items during spherical grip and pulp-to-pulp grasp. She does have limited dexterity of L hand which may plateau given level of SCI, advanced age, and length of time since injury. She is able to ambulate over sidewalk, asphalt, and low-grade incline/decline outside of the office building today. She has ongoing bed mobility deficits associated with truncal weakness and limited upper limb strength. Patient will benefit from continued skilled therapeutic intervention per established POC for improved function and QoL.    Personal Factors and Comorbidities Age;Comorbidity 3+;Time since onset of injury/illness/exacerbation    Comorbidities GERD, pre-diabetes, obesity, gout, s/p C3-7 ACDF    Examination-Activity Limitations Bathing;Continence;Reach Overhead;Stairs;Bed Mobility;Dressing;Self Feeding;Stand;Hygiene/Grooming;Toileting;Transfers;Locomotion Level    Examination-Participation Restrictions Community Activity;Yard Work;Laundry;Cleaning;Shop    Stability/Clinical Decision Making Evolving/Moderate complexity    Rehab Potential Fair    PT Frequency Other (comment)   2-3x/week   PT Duration Other (comment)   16 weeks   PT Treatment/Interventions Electrical Stimulation;Gait training;Stair training;Functional mobility training;Therapeutic activities;Therapeutic exercise;Balance training;Neuromuscular re-education;Wheelchair mobility training;Patient/family education    PT Next Visit Plan Gait re-training with FWW and facilitating improved independence with AD, task practice for transferring, LE strengthening, UE AROM and strengthening to assist with functional mobility and transfers.    PT Home Exercise Plan Pt continuing with home health exercises. Pt continuing with task practice for rolling, bridging, supine to sit  transfers in bed; walking with supervision/decreasing level fo assist with FWW; reaching and grasping with LUE. 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, initial encounter [facility])     Problem List Patient Active Problem List   Diagnosis Date Noted   Generalized anxiety disorder 01/24/2021   Suspected deep tissue injury 10/25/2020   Wheelchair dependence 11/27/2019   Spasticity 11/27/2019   Hyperkalemia    Steroid-induced hyperglycemia    Prediabetes    Neurogenic bowel    Neurogenic bladder    Neuropathic pain    Quadriplegia ([place]) 08/31/2019   S/P cervical spinal fusion 08/28/2019   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 [place], [place], [address] Phone: [phone]   Fax:  [phone]  Name: Meredith Roach MRN: [id number] Date of Birth: [birth year]

## 2021-05-02 ENCOUNTER — Other Ambulatory Visit: Payer: Self-pay

## 2021-05-02 ENCOUNTER — Encounter: Payer: Medicare PPO | Admitting: Physical Medicine and Rehabilitation

## 2021-05-02 ENCOUNTER — Encounter: Payer: Self-pay | Admitting: Physical Medicine and Rehabilitation

## 2021-05-02 VITALS — BP 116/74 | HR 95 | Ht 63.0 in

## 2021-05-02 DIAGNOSIS — G8250 Quadriplegia, unspecified: Secondary | ICD-10-CM

## 2021-05-02 DIAGNOSIS — R252 Cramp and spasm: Secondary | ICD-10-CM | POA: Diagnosis not present

## 2021-05-02 DIAGNOSIS — F411 Generalized anxiety disorder: Secondary | ICD-10-CM

## 2021-05-02 DIAGNOSIS — Z993 Dependence on wheelchair: Secondary | ICD-10-CM | POA: Diagnosis not present

## 2021-05-02 MED ORDER — GABAPENTIN 600 MG PO TABS: 1200.0000 mg | ORAL_TABLET | Freq: Three times a day (TID) | ORAL | 5 refills | Status: DC

## 2021-05-02 NOTE — Progress Notes (Signed)
Subjective:    Patient ID: Meredith Roach, female    DOB: [birth year], 81 y.o.   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 And worsening spasticity.   Injury was 08/23/19! Here for f/u on SCI/quadriplegia.    Pt is s/p Botox 04/18/21- by Dr Letta Pate Brachioradialis 25 FCR0 FCU0 FDS50 FDP25 FPL0 Lumbricals 25 x 3=75 Waste 25U  Started Paxil 10 mg nightly- Helped the anxiety some- has helped 40-5% overall- much better.   Hasn't tried the Buspar as needed- didn't get it filled- daughter wanted her to try Paxil first, and wait on Buspar.  Hasn't had any episodes that felt like would need it/take the Buspar- so doesn't think needs it.   Hasn't look into Medicaid.  Misplaced a number- and hasn't followed up with it.  She doesn't qualify for medicaid.   H/H stopped coming-  Now in outpt therapy- doing much better. Progressing more in outpt PT- no OT being done.  Goes to [facility] in [place], [place] Was going 3x/week, but now 2x/week which works better for her.   Walking with RW some- and can step over threshold- and go into parking lot- at least a couple of hundred ft- not documented in chart.  PT also working on arm strength and dexterity- esp with hands.    Able to feed self-  Working to help her get OOB with assistance- right now uses stick to pull herself OOB into w/c- can push more with L arm than did.   Every day getting up more. Walking some at home with RW- and can balance on side of bed much better.   Nerve pain- Friday and Saturday- had severe nerve pain- on fire-  Had only been taking less gabapentin until 2 days ago-now taking 300 mg tabs 3 capsules 4x/day-   Spasticity is better since Botox- L hand is the main issue for tone/spasticity. Still difficult to open the L hand.    Bowels going OK.   Still has Foley- changed Monday- [facility] is changing it monthly.    Pain Inventory Average Pain 5 Pain Right Now 3 My pain is intermittent, burning, and tight  LOCATION OF PAIN  All the left side- foot up to hip  BOWEL Number of stools per week: 7  BLADDER Foley  Mobility walk with assistance use a walker ability to climb steps?  no do you drive?  no needs help with transfers Do you have any goals in this area?  yes  Function retired I need assistance with the following:  dressing, bathing, toileting, meal prep, household duties, and shopping Do you have any goals in this area?  yes  Neuro/Psych bladder control problems weakness numbness tremor tingling trouble walking  Prior Studies Any changes since last visit?  no  Physicians involved in your care Any changes since last visit?  no   Family History  Problem Relation Age of Onset   Breast cancer Maternal Aunt    Breast cancer Paternal Aunt    CAD Father    Heart attack Father    Social History   Socioeconomic History   Marital status: Married    Spouse name: [name]   Number of children: 2   Years of education: Not on file   Highest education level: Not on file  Occupational  History   Not on file  Tobacco Use   Smoking status: Never   Smokeless tobacco: Never  Vaping Use   Vaping Use: Never used  Substance and Sexual Activity   Alcohol use: No   Drug use: No   Sexual activity: Not Currently  Other Topics Concern   Not on file  Social History Narrative   Lives with daughter   Social Determinants of Health   Financial Resource Strain: Not on file  Food Insecurity: Not on file  Transportation Needs: Not on file  Physical Activity: Not on file  Stress: Not on file  Social Connections: Not on file   Past Surgical History:  Procedure Laterality Date   ANTERIOR CERVICAL DECOMPRESSION/DISCECTOMY FUSION 4 LEVELS N/A 08/28/2019   Procedure: ANTERIOR CERVICAL  DECOMPRESSION/DISCECTOMY FUSION CERVICALTHREE-FOUR CERVICAL,FOUR-FIVE,CERVICAL FIVE-SIX,CERVICAL SIX-SEVEN.;  Surgeon: [name], MD;  Location: [place];  Service: Neurosurgery;  Laterality: N/A;  ANTERIOR   BACK SURGERY     CATARACT EXTRACTION W/PHACO Right 03/31/2018   Procedure: CATARACT EXTRACTION PHACO AND INTRAOCULAR LENS PLACEMENT ([place]);  Surgeon: [name], MD;  Location: [facility];  Service: Ophthalmology;  Laterality: Right;  [place] 00:39.7 CDE 4.35 Fluid Pack Lot # [id number]   CHOLECYSTECTOMY  1995   COLONOSCOPY WITH PROPOFOL N/A 06/07/2017   Procedure: COLONOSCOPY WITH PROPOFOL;  Surgeon: [name], MD;  Location[facility] ENDOSCOPY;  Service: Endoscopy;  Laterality: N/A;   ESOPHAGOGASTRODUODENOSCOPY (EGD) WITH PROPOFOL N/A 06/07/2017   Procedure: ESOPHAGOGASTRODUODENOSCOPY (EGD) WITH PROPOFOL;  Surgeon: [name], MD;  Location: [facility] ENDOSCOPY;  Service: Endoscopy;  Laterality: N/A;   IR CATHETER TUBE CHANGE  05/10/2020   REDUCTION MAMMAPLASTY Bilateral 1994   Past Medical History:  Diagnosis Date   Arthritis    GERD (gastroesophageal reflux disease)    Gout    Neuromuscular disorder ([place])    Pre-diabetes    There were no vitals taken for this visit.  Opioid Risk Score:   Fall Risk Score:  `1  Depression screen PHQ 2/9  Depression screen [facility] 2/9 04/18/2021 10/25/2020 10/04/2020 05/24/2020 01/15/2020 11/27/2019 10/11/2019  Decreased Interest 0 0 0 0 0 0 0  Down, Depressed, Hopeless 0 0 0 0 0 0 0  PHQ - 2 Score 0 0 0 0 0 0 0    Review of Systems  Genitourinary:        Foley in place  Musculoskeletal:  Positive for gait problem.       Left side pain hip down to feet pain  Neurological:  Positive for tremors, weakness and numbness.       Tinglint  All other systems reviewed and are negative.     Objective:   Physical Exam Awake, alert, less anxious, accompanied by husband, in power w/c, NAD Losing ROM of L wrist.  Wrist in mild hyerextension at rest due to  forming contracture.  Losing mild ROM in L hand- cannot flatten out palm completely- in a little bit of claw position. Neuro:  MAS of 2 in L hand due to tone and MAS 2-3 in L wrist.   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 And worsening spasticity.   Injury was 08/23/19! Here for f/u on SCI/spasticity, etc  Ask Dellis Filbert if they have OT- and if so, would Dellis Filbert think she would benefit from OT- since has limited visits. Ask him when you see him.  2. Continue the Baclofen 10 mg TID, Duloxetine 60 mg daily; and Gabapentin 900 mg 4x/day. But will change Gabapentin actually to 1200 (2 tabs) mg 3x/day- so takes less often and fewer tabs. Sent in for pt.  3.  Con't Paxil at current dose- no reason to go up with helpful. Wait on picking up Buspar since doesn't think needs right now.   4. Let me look into if possible to get H/H aide at all at home-   5. Spasticity plateau'ing, so shouldn't get much worse than where it is- which is good. Went over this.   6. F/U in 4 months-    I spent a total of 31 minuteson total care- going over spasticity , care at home; and anxiety as well as nerve pain.

## 2021-05-02 NOTE — Patient Instructions (Signed)
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 And worsening spasticity.   Injury was 08/23/19! Here for f/u on SCI/spasticity, etc  Ask Dellis Filbert if they have OT- and if so, would Dellis Filbert think she would benefit from OT- since has limited visits. Ask him when you see him.  2. Continue the Baclofen 10 mg TID, Duloxetine 60 mg daily; and Gabapentin 900 mg 4x/day. But will change Gabapentin actually to 1200 (2 tabs) mg 3x/day- so takes less often and fewer tabs. Sent in for pt.  3.  Con't Paxil at current dose- no reason to go up with helpful. Wait on picking up Buspar since doesn't think needs right now.   4. Let me look into if possible to get H/H aide at all at home-   5. Spasticity plateau'ing, so shouldn't get much worse than where it is- which is good. Went over this.   6. F/U in 4 months-

## 2021-05-05 ENCOUNTER — Other Ambulatory Visit: Payer: Self-pay

## 2021-05-05 ENCOUNTER — Encounter: Payer: Self-pay | Admitting: Physical Therapy

## 2021-05-05 ENCOUNTER — Ambulatory Visit: Payer: Medicare PPO | Admitting: Physical Therapy

## 2021-05-05 DIAGNOSIS — R262 Difficulty in walking, not elsewhere classified: Secondary | ICD-10-CM

## 2021-05-05 DIAGNOSIS — M6281 Muscle weakness (generalized): Secondary | ICD-10-CM

## 2021-05-05 DIAGNOSIS — S14123A 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, initial encounter: Secondary | ICD-10-CM

## 2021-05-05 NOTE — Therapy (Signed)
[facility] [address] [place], [place], [address] Phone: [phone]   Fax:  [phone]  Physical Therapy Treatment  Patient Details  Name: Meredith Roach MRN: [id number] Date of Birth: February 07, 1941 Referring Provider (PT): [name], MD   Encounter Date: 05/05/2021   PT End of Session - 05/05/21 1136     Visit Number 20    Number of Visits 60    Date for PT Re-Evaluation 03/18/21    Authorization Time Period Cert 09/16/28-1/60/10, last progress note 04/23/21; current auth 04/11/21-05/21/21    Authorization - Visit Number 7    Authorization - Number of Visits 12    Progress Note Due on Visit 2    PT Start Time 1138    PT Stop Time 1230    PT Time Calculation (min) 52 min    Equipment Utilized During Treatment Gait belt   Pt uses power wheelchair, clinic's front-wheeled walker utilized for gait   Activity Tolerance Patient tolerated treatment well    Behavior During Therapy WFL for tasks assessed/performed             Past Medical History:  Diagnosis Date   Arthritis    GERD (gastroesophageal reflux disease)    Gout    Neuromuscular disorder ([place])    Pre-diabetes     Past Surgical History:  Procedure Laterality Date   ANTERIOR CERVICAL DECOMPRESSION/DISCECTOMY FUSION 4 LEVELS N/A 08/28/2019   Procedure: ANTERIOR CERVICAL DECOMPRESSION/DISCECTOMY FUSION CERVICALTHREE-FOUR CERVICAL,FOUR-FIVE,CERVICAL FIVE-SIX,CERVICAL SIX-SEVEN.;  Surgeon: [name], MD;  Location: [facility];  Service: Neurosurgery;  Laterality: N/A;  ANTERIOR   BACK SURGERY     CATARACT EXTRACTION W/PHACO Right 03/31/2018   Procedure: CATARACT EXTRACTION PHACO AND INTRAOCULAR LENS PLACEMENT ([place]);  Surgeon: [name], MD;  Location: [facility] ORS;  Service: Ophthalmology;  Laterality: Right;  [place] 00:39.7 CDE 4.35 Fluid Pack Lot # [id number]   CHOLECYSTECTOMY  1995   COLONOSCOPY WITH PROPOFOL N/A 06/07/2017   Procedure: COLONOSCOPY WITH PROPOFOL;   Surgeon: [name], MD;  Location: [facility] ENDOSCOPY;  Service: Endoscopy;  Laterality: N/A;   ESOPHAGOGASTRODUODENOSCOPY (EGD) WITH PROPOFOL N/A 06/07/2017   Procedure: ESOPHAGOGASTRODUODENOSCOPY (EGD) WITH PROPOFOL;  Surgeon: [name], MD;  Location: [facility] ENDOSCOPY;  Service: Endoscopy;  Laterality: N/A;   IR CATHETER TUBE CHANGE  05/10/2020   REDUCTION MAMMAPLASTY Bilateral 1994    There were no vitals filed for this visit.   Subjective Assessment - 05/05/21 1138     Subjective Patient reports that her physician wanted to work more on wrist ROM. Her referring provider was questioning if pt could start OT. Patient reports she is making good progress at this time overall. She feels she needs more core control.    Patient is accompained by: Family member   husband   Pertinent History Patient is a 81 year old female s/p incomplete SCI. She had incomplete SCI following fall - C4 [place] D (DOI: 08/26/19). Primary activity limitations c difficulty walking, transferring, and completing independent functional mobility following incomplete SCI. She has undergone home health therapy for about one year. She and her husband reports she was able to transfer and ambulate with walker in home with PT walking beside her. Pt has practiced with front-wheel walker at home. 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 Patient reports using steady transfer aid for performing transfers chair to bed and W/C to standard chair. Her husband states that she is able to stand with steady device and can stand  with her legs against the bed. Pt has ramp to get in her home for her power wheelchair. Patient lives in one level home. Pt uses tub bench that she can utilize to slide into shower; her daughter/husband assists with bathing. Pt has [employer] Lei that can accommodate her power chair at this time. Pt has concrete driveway to get up to her ramp. Pt has small threshold to get into her home - husband utilizes cardboard to  get over threshold.    Limitations Walking;Standing;House hold activities;Other (comment)   transferring, bed mobility, bladder management   Patient Stated Goals to be able to walk more independently               TREATMENT     Therapeutic Activities - patient education, repetitive task practice for improved performance of daily functional activities e.g. transferring, bed mobility     Ambulation through doorway threshold and along outside sidewalk with FWW, with 90-degree turn on sidewalk R and L and negotiation of low-grade incline/decline and then returning back to gym; x 10 minutes  Patient education: Reviewed wrist AAROM for home exercise and concept of velocity-dependent resistance to motion. Discussed availability of occupational therapy at [facility] in [place].       *next visit* Bed mobility task practice: Performed bilateral rolling on low mat; x 1 with no UE pull to assist and x5 with R upper limb to pull into either position (minimal pull required to perform roll); Sidelying trunk lift, sidelying on L side; x5     *not today* Ambulation over exercise mat on floor to simulate negotiating change in surface elevation and uneven ground, subsequent through doorway threshold and along outside sidewalk with FWW, with 90-degree turn on sidewalk and then returning back to gym; x 6 minutes (pt negotiated threshold x 2) Car transfer simulation with lifting lower limbs over 6-inch hurdle along corner of table (transferring lower limbs over hurdle with 90-degree turn in sitting); performed x 1 over and back Performance of supine to/from sit with pt able to assume partial sidelying position onto L forearm  Lower trunk rotations with bilateral lower limbs in hooklying  with dowel hold c bilat UE; for trunk control during rolling and re-positioning in bed; 1x12 alternating Alternating bent-knee fallout in hooklying with dowel hold bilat UE; for trunk control during rolling and bed  mobility tasks; 1x15 alternating Sit to stand from raised table, 2x10 with table height, 21-inches for height of table Seated toe tapping to Airex pad; x25 alternating to simulate demands of LE management/seated hip and knee flexion needed for dressing/self-care and car transfer Dressing simulation with Theraband loop (see above); x 4 minutes Bed mobility task practice: Performed bilateral rolling on low mat with Independent roll to L x 5; roll to R x 5 [MaxA to initiate LUE cross-body shoulder adduction and to facilitate truncal rotation via pelvis, pt able to complete last 50% of roll with Minimal assist] Bridging; x25 as needed for scooting and re-positioning pelvis on bed          Neuromuscular Re-education - for increased motor recruitment of major muscle groups with motor impairment, nervous system priming and exercises to promote trunk stability       Adjacent to low mat while standing in FWW: -Standing forward tap to Airex pad; foot flat on top of Airex 2x10; alternating L and R [simulating demands of lifting legs for bed transfers and car transfer, baseline step for stepping up to curb/stair] -Standing reaching and grasping with small  cubes of varying size, cylinders, foam balls (in "Hand PT packet"); grasping and placing into adjacent container on raised treatment table; x 5 minutes (emphasis on LUE, minimal deficit with reaching/grasping RUE) -Reaching toward sticky-notes on backside of rolling mirror, 3 targets, with no UE support; x 5 minutes [lower targets for L upper limb] -Standing march with R upper extremity support only on single parallel bar, 1x10 alternating [full clearance of RLE, unweighting of LLE with inconsistent clearance from floor]   Target stepping; stepping over blue lines on agility ladder; 3x D/B for improved targeting and foot clearance bilaterally during swing phase      *not today* Standing toe tapping onto 6-inch step to promote active dorsiflexion during  LE swing and bilateral weight shift and weight acceptance; 1x15 and 1x10 bilateral LE Supine alternating knee extension and shoulder elevation (dying bug technique) from hooklying position; 2x12 alternating  Sidelying clamshell with tactile cueing at trunk for maintaining sidelying position, to facilitate active lower limb lifting in sidelying position as needed for independent sitting to supine/supine to sit transfers; x20 on each side Sidestep in FWW along length of parallel bars; 1x D/B with CGA Hurdle step over 2-lb ankle weight on floor to simulate motor control and coordination demands for clearing threshold or minor change in surface height; 2x10 each LE Standing toe tapping onto 6-inch step to promote active dorsiflexion during LE swing and bilateral weight shift and weight acceptance; x20 bilateral LE Minisquat at edge of table; 2x10 with therapist guarding posteriorly in case of loss of descending control Unilateral upper extremity reaching and grasping in standing position with minimal UE support, with bilateral fine motor task, pulp to pulp grasp for metal nuts, moving to adjacent container requiring cross-body reaching; x 2 min Standing heel-to-toe step over blue line; alternating LE; 2x10 alternating            ASSESSMENT Patient demonstrates improved consistency with reaching and grasping task practice today, though pt does have ongoing spasticity and decreased L upper limb motor control limiting reaching and grasping with L side. She demonstrates modified-independent sit to stand and stand-pivot transfers with FWW. She is able to manage certain components of wheelchair with improved independence; e.g. pt is able to manage footrest IND, she is able to doff safety belt IND, and she is able to manage catheter bag with minimal assist during most activities today (pt is only dependent with hooking catheter bag under her seat). She is able to negotiate low-grade incline/decline and  sidewalk/asphalt with no LOB. Patient will benefit from continued skilled therapeutic intervention per established POC for improved function and QoL.     PT Short Term Goals - 04/23/21 1254       PT SHORT TERM GOAL #1   Title Patient will perform stand-pivot transfer from chair to bed with supervision level of assist and ModI setup of power chair as needed for transferring at home    Baseline 11/26/20: Max assist for setup and mod assist to perform sit to stand, unsteady pivot prior to performing stand to sit.    12/23/20: Patient is able to setup power chair for transfer ModI. Mod to Max assist for sit to stand. CGA level of assist for standing pivot and stand to sit on low mat.   01/15/21: Setup of power chair without assist; modified independent pivot and stand-to-sit; ModA for initial set to stand prior to pivot.  02/10/21: ModI setup of power chair, ModA for initiation of sit to stand, modified-independent stand pivot  transfer.     03/17/21: Performed with CGA during sit to stand only and supervision level of assist for remainder of stand-pivot transfer (performing from power chair to table).   04/23/21: Pt performed stand-pivot transfer from power chair to low mat with CGA only needed during sit to stand to guard posteriorly.    Time 4    Period Weeks    Status Partially Met    Target Date 04/07/21      PT SHORT TERM GOAL #2   Title Patient will perform standing toe tap to 3-inch surface (Airex pad) with bilateral upper extremity support indicative of improved ability to clear feet from floor during stepping and improved weight shift to each LE with AD use    Baseline 11/26/20: Limited toe clearance during bilateral LE swing phase with front-wheeled walker.   12/23/20: performed at previous follow-up with bilateral UE support.    Time 3    Period Weeks    Status Achieved    Target Date 12/17/20               PT Long Term Goals - 04/23/21 1255       PT LONG TERM GOAL #1   Title Patient  will demonstrate improved function as evidenced by a score of 35 on FOTO measure for full participation in activities at home and in the community.    Baseline 11/26/20: FOTO 12.   12/23/20: FOTO 24.    01/15/21: FOTO 23.   02/10/21: FOTO 38.   03/17/21: 49.   04/23/21: 30.    Time 16    Period Weeks    Status Partially Met   previously had score > 35   Target Date 06/04/21      PT LONG TERM GOAL #2   Title Patient will ambulate for 150 feet without LOB, with sufficient toe clearance to prevent loss of gait stability, and c proper AD placement with least-restrictive assistive device with supervision level of assist    Baseline 11/26/20: Gait x 40 feet with CGA.   12/23/20: CGA with dec toe clearance L>RLE.   01/15/21: Ambulated 160 feet without LOB, proper AD placement; good toe clearance with first 80 feet, but decrased L>R toe clearanc with fatigue.   02/10/21: Performed with FWW with good toe clearance with first 80 feet, decreasing L toe clearance with succesive steps.   03/17/21: Performed without LOB, no significant instability with bilateral UE support on front-wheel walker; decreased toe clearance during gait L>R.  04/23/21: Performed with no LOB, proper placement of FWW, and sufficient toe clearance to negotiate doorway threshold, sidewalk, and change in surface elevation.    Time 12    Period Weeks    Status Achieved    Target Date 04/10/21      PT LONG TERM GOAL #3   Title Patient will perform independent re-positioning in chair with use of closed-chain triceps on armrests as needed for independent wheelchair mobility with no verbal cueing or tactile cueing required    Baseline 11/26/20: Dependent re-positioning in wheelchair.   12/23/20: Performed with feet on fold-out platform of her power wheelchair IND.    Time 8    Period Weeks    Status Achieved    Target Date 12/23/20      PT LONG TERM GOAL #4   Title Patient will perform simulated step-over task for clearing doorway threshold with use  of front-wheeled walker as needed for entering/exiting home with supervision level of assistance and no LOB  Baseline 11/26/20: Pt currently uses power wheelchair to negotiate threshold into home.   12/23/20: Deferred.  01/15/21: Deferred.   02/10/21: Performed last week with step-over single line on floor with negligible height.    03/17/21: Performed over doorway threshold in clinic today with intermittent CGA and no LOB.  04/23/21: Performed today with supervision level of assist.    Time 16    Period Weeks    Status Achieved    Target Date 04/10/21                   Plan - 05/05/21 1758     Clinical Impression Statement Patient demonstrates improved consistency with reaching and grasping task practice today, though pt does have ongoing spasticity and decreased L upper limb motor control limiting reaching and grasping with L side. She demonstrates modified-independent sit to stand and stand-pivot transfers with FWW. She is able to manage certain components of wheelchair with improved independence; e.g. pt is able to manage footrest IND, she is able to doff safety belt IND, and she is able to manage catheter bag with minimal assist during most activities today (pt is only dependent with hooking catheter bag under her seat). She is able to negotiate low-grade incline/decline and sidewalk/asphalt with no LOB. Patient will benefit from continued skilled therapeutic intervention per established POC for improved function and QoL.    Personal Factors and Comorbidities Age;Comorbidity 3+;Time since onset of injury/illness/exacerbation    Comorbidities GERD, pre-diabetes, obesity, gout, s/p C3-7 ACDF    Examination-Activity Limitations Bathing;Continence;Reach Overhead;Stairs;Bed Mobility;Dressing;Self Feeding;Stand;Hygiene/Grooming;Toileting;Transfers;Locomotion Level    Examination-Participation Restrictions Community Activity;Yard Work;Laundry;Cleaning;Shop    Stability/Clinical Decision Making  Evolving/Moderate complexity    Rehab Potential Fair    PT Frequency Other (comment)   2-3x/week   PT Duration Other (comment)   16 weeks   PT Treatment/Interventions Electrical Stimulation;Gait training;Stair training;Functional mobility training;Therapeutic activities;Therapeutic exercise;Balance training;Neuromuscular re-education;Wheelchair mobility training;Patient/family education    PT Next Visit Plan Gait re-training with FWW and facilitating improved independence with AD, task practice for transferring, LE strengthening, UE AROM and strengthening to assist with functional mobility and transfers.    PT Home Exercise Plan Pt continuing with home health exercises. Pt continuing with task practice for rolling, bridging, supine to sit transfers in bed; walking with supervision/decreasing level fo assist with FWW; reaching and grasping with LUE. 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, initial encounter [facility])     Problem List Patient Active Problem List   Diagnosis Date Noted   Generalized anxiety disorder 01/24/2021   Suspected deep tissue injury 10/25/2020   Wheelchair dependence 11/27/2019   Spasticity 11/27/2019   Hyperkalemia    Steroid-induced hyperglycemia    Prediabetes    Neurogenic bowel    Neurogenic bladder    Neuropathic pain    Quadriplegia ([place]) 08/31/2019   S/P cervical spinal fusion 08/28/2019   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 [place], [place], [address] Phone: [phone]   Fax:  [phone]  Name: Meredith Roach MRN: [id number] Date of Birth: 09-Sep-1940

## 2021-05-07 ENCOUNTER — Other Ambulatory Visit: Payer: Self-pay

## 2021-05-07 ENCOUNTER — Ambulatory Visit: Payer: Medicare PPO | Admitting: Physical Therapy

## 2021-05-07 DIAGNOSIS — S14123A 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, initial encounter: Secondary | ICD-10-CM

## 2021-05-07 DIAGNOSIS — R262 Difficulty in walking, not elsewhere classified: Secondary | ICD-10-CM | POA: Diagnosis not present

## 2021-05-07 DIAGNOSIS — M6281 Muscle weakness (generalized): Secondary | ICD-10-CM

## 2021-05-08 ENCOUNTER — Encounter: Payer: Self-pay | Admitting: Physical Therapy

## 2021-05-09 NOTE — Therapy (Signed)
[facility] [address]. [place], [place], [address] Phone: [phone]   Fax:  [phone]  Physical Therapy Treatment  Patient Details  Name: Meredith Roach MRN: [id number] Date of Birth: 1941/04/08 Referring Provider (PT): [name], MD   Encounter Date: 05/07/2021   PT End of Session - 05/08/21 1542     Visit Number 16    Number of Visits 60    Date for PT Re-Evaluation 03/18/21    Authorization Time Period Cert 11/12/63-5/37/48, last progress note 04/23/21; current auth 04/11/21-05/21/21    Authorization - Visit Number 7    Authorization - Number of Visits 12    Progress Note Due on Visit 49    PT Start Time 1148    PT Stop Time 1235    PT Time Calculation (min) 47 min    Equipment Utilized During Treatment Gait belt   Pt uses power wheelchair, clinic's front-wheeled walker utilized for gait   Activity Tolerance Patient tolerated treatment well    Behavior During Therapy WFL for tasks assessed/performed             Past Medical History:  Diagnosis Date   Arthritis    GERD (gastroesophageal reflux disease)    Gout    Neuromuscular disorder ([place])    Pre-diabetes     Past Surgical History:  Procedure Laterality Date   ANTERIOR CERVICAL DECOMPRESSION/DISCECTOMY FUSION 4 LEVELS N/A 08/28/2019   Procedure: ANTERIOR CERVICAL DECOMPRESSION/DISCECTOMY FUSION CERVICALTHREE-FOUR CERVICAL,FOUR-FIVE,CERVICAL FIVE-SIX,CERVICAL SIX-SEVEN.;  Surgeon: [name], MD;  Location: [facility];  Service: Neurosurgery;  Laterality: N/A;  ANTERIOR   BACK SURGERY     CATARACT EXTRACTION W/PHACO Right 03/31/2018   Procedure: CATARACT EXTRACTION PHACO AND INTRAOCULAR LENS PLACEMENT ([place]);  Surgeon: [name], MD;  Location: [facility];  Service: Ophthalmology;  Laterality: Right;  [place] 00:39.7 CDE 4.35 Fluid Pack Lot # [id number]   CHOLECYSTECTOMY  1995   COLONOSCOPY WITH PROPOFOL N/A 06/07/2017   Procedure: COLONOSCOPY WITH PROPOFOL;   Surgeon: [name], MD;  Location: [facility] ENDOSCOPY;  Service: Endoscopy;  Laterality: N/A;   ESOPHAGOGASTRODUODENOSCOPY (EGD) WITH PROPOFOL N/A 06/07/2017   Procedure: ESOPHAGOGASTRODUODENOSCOPY (EGD) WITH PROPOFOL;  Surgeon: [name], MD;  Location: [facility] ENDOSCOPY;  Service: Endoscopy;  Laterality: N/A;   IR CATHETER TUBE CHANGE  05/10/2020   REDUCTION MAMMAPLASTY Bilateral 1994    There were no vitals filed for this visit.   Subjective Assessment - 05/09/21 1000     Subjective Patient reports compliance with her HEP and continued work on reaching and grasping exercise, walking, and bed mobility work. She reports "tightness" affecting L arm/wrist and L lower extremity that improves as she walks/moves. Patient reports feeling generally well this AM.    Patient is accompained by: Family member   husband   Pertinent History Patient is a 81 year old female s/p incomplete SCI. She had incomplete SCI following fall - C4 [place] D (DOI: 08/26/19). Primary activity limitations c difficulty walking, transferring, and completing independent functional mobility following incomplete SCI. She has undergone home health therapy for about one year. She and her husband reports she was able to transfer and ambulate with walker in home with PT walking beside her. Pt has practiced with front-wheel walker at home. 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 Patient reports using steady transfer aid for performing transfers chair to bed and W/C to standard chair. Her husband states that she is able to stand with steady device and can  stand with her legs against the bed. Pt has ramp to get in her home for her power wheelchair. Patient lives in one level home. Pt uses tub bench that she can utilize to slide into shower; her daughter/husband assists with bathing. Pt has [employer] Lei that can accommodate her power chair at this time. Pt has concrete driveway to get up to her ramp. Pt has small threshold to get into  her home - husband utilizes cardboard to get over threshold.    Limitations Walking;Standing;House hold activities;Other (comment)   transferring, bed mobility, bladder management   Patient Stated Goals to be able to walk more independently                TREATMENT     Therapeutic Activities - patient education, repetitive task practice for improved performance of daily functional activities e.g. transferring, bed mobility    Patient independently managing components of wheelchair, doffing safety belt, moving footrest, and doffing gait belt. Pt able to independently manage catheter drainage bag once removed from under power chair seat, pt requires MaxA for moving bag around front of FWW (around leg of walker furthest from her)   Bed mobility task practice: Performed bilateral rolling on low mat with sidelying trunk lift on either side; performed R and L x 5   Ambulate laps in gym with FWW; performed x 3 with min verbal cueing for foot clearance and supervision/CGA  Lateral lift over 6-inch hurdle to simulate demands of managing lower limbs during transfer from bed and car transfer; x10 with each LE       *not today* Ambulation through doorway threshold and along outside sidewalk with FWW, with 90-degree turn on sidewalk R and L and negotiation of low-grade incline/decline and then returning back to gym; x 10 minutes Ambulation over exercise mat on floor to simulate negotiating change in surface elevation and uneven ground, subsequent through doorway threshold and along outside sidewalk with FWW, with 90-degree turn on sidewalk and then returning back to gym; x 6 minutes (pt negotiated threshold x 2) Car transfer simulation with lifting lower limbs over 6-inch hurdle along corner of table (transferring lower limbs over hurdle with 90-degree turn in sitting); performed x 1 over and back Performance of supine to/from sit with pt able to assume partial sidelying position onto L forearm   Lower trunk rotations with bilateral lower limbs in hooklying  with dowel hold c bilat UE; for trunk control during rolling and re-positioning in bed; 1x12 alternating Alternating bent-knee fallout in hooklying with dowel hold bilat UE; for trunk control during rolling and bed mobility tasks; 1x15 alternating Sit to stand from raised table, 2x10 with table height, 21-inches for height of table Seated toe tapping to Airex pad; x25 alternating to simulate demands of LE management/seated hip and knee flexion needed for dressing/self-care and car transfer Dressing simulation with Theraband loop (see above); x 4 minutes Bed mobility task practice: Performed bilateral rolling on low mat with Independent roll to L x 5; roll to R x 5 [MaxA to initiate LUE cross-body shoulder adduction and to facilitate truncal rotation via pelvis, pt able to complete last 50% of roll with Minimal assist] Bridging; x25 as needed for scooting and re-positioning pelvis on bed          Neuromuscular Re-education - for increased motor recruitment of major muscle groups with motor impairment, nervous system priming and exercises to promote trunk stability     Obstacle course; stepping over uneven ground (blue mat with  ankle weights under it) and stepping over blue lines on agility ladder, consecutively; 3x each for improved targeting, foot clearance, and negotiating uneven terrain    Adjacent to low mat while standing in FWW: -Standing forward tap to Airex pad; foot flat on top of Airex 2x10; alternating L and R [simulating demands of lifting legs for bed transfers and car transfer, baseline step for stepping up to curb/stair] -Standing reaching and grasping with small cubes of varying size, cylinders, foam balls (in "Hand PT packet"); grasping and placing into adjacent container on raised treatment table; x 5 minutes (emphasis on LUE, minimal deficit with reaching/grasping RUE)               *not today* -Reaching  toward sticky-notes on backside of rolling mirror, 3 targets, with no UE support; x 5 minutes [lower targets for L upper limb] Standing march with R upper extremity support only on single parallel bar, 1x10 alternating [full clearance of RLE, unweighting of LLE with inconsistent clearance from floor] Standing toe tapping onto 6-inch step to promote active dorsiflexion during LE swing and bilateral weight shift and weight acceptance; 1x15 and 1x10 bilateral LE Supine alternating knee extension and shoulder elevation (dying bug technique) from hooklying position; 2x12 alternating  Sidelying clamshell with tactile cueing at trunk for maintaining sidelying position, to facilitate active lower limb lifting in sidelying position as needed for independent sitting to supine/supine to sit transfers; x20 on each side Sidestep in FWW along length of parallel bars; 1x D/B with CGA Hurdle step over 2-lb ankle weight on floor to simulate motor control and coordination demands for clearing threshold or minor change in surface height; 2x10 each LE Standing toe tapping onto 6-inch step to promote active dorsiflexion during LE swing and bilateral weight shift and weight acceptance; x20 bilateral LE Minisquat at edge of table; 2x10 with therapist guarding posteriorly in case of loss of descending control Unilateral upper extremity reaching and grasping in standing position with minimal UE support, with bilateral fine motor task, pulp to pulp grasp for metal nuts, moving to adjacent container requiring cross-body reaching; x 2 min Standing heel-to-toe step over blue line; alternating LE; 2x10 alternating            ASSESSMENT Patient demonstrates further improvement in ability for negotiating various surfaces with front-wheel walker, including uneven terrain with bumps (made with large ankle weights under large exercise floor mat) and negotiating lines on floor requiring further demand on lower limb coordination and  larger step length. Patient demonstrates improving ability to perform pulp to pulp grasping and fine motor work with left upper extremity (though movement is limited by partial paresis and spasticity). She is able to perform rolling with assist from stronger (right) upper extremity with minimal assist required from therapist. Pt does need further work on bed mobility to improve independent re-positioning as needed for sleep quality and prevention of pressure wounds. Patient will benefit from continued skilled therapeutic intervention per established POC for improved function and QoL.       PT Short Term Goals - 04/23/21 1254       PT SHORT TERM GOAL #1   Title Patient will perform stand-pivot transfer from chair to bed with supervision level of assist and ModI setup of power chair as needed for transferring at home    Baseline 11/26/20: Max assist for setup and mod assist to perform sit to stand, unsteady pivot prior to performing stand to sit.    12/23/20: Patient is able to  setup power chair for transfer ModI. Mod to Max assist for sit to stand. CGA level of assist for standing pivot and stand to sit on low mat.   01/15/21: Setup of power chair without assist; modified independent pivot and stand-to-sit; ModA for initial set to stand prior to pivot.  02/10/21: ModI setup of power chair, ModA for initiation of sit to stand, modified-independent stand pivot transfer.     03/17/21: Performed with CGA during sit to stand only and supervision level of assist for remainder of stand-pivot transfer (performing from power chair to table).   04/23/21: Pt performed stand-pivot transfer from power chair to low mat with CGA only needed during sit to stand to guard posteriorly.    Time 4    Period Weeks    Status Partially Met    Target Date 04/07/21      PT SHORT TERM GOAL #2   Title Patient will perform standing toe tap to 3-inch surface (Airex pad) with bilateral upper extremity support indicative of improved  ability to clear feet from floor during stepping and improved weight shift to each LE with AD use    Baseline 11/26/20: Limited toe clearance during bilateral LE swing phase with front-wheeled walker.   12/23/20: performed at previous follow-up with bilateral UE support.    Time 3    Period Weeks    Status Achieved    Target Date 12/17/20               PT Long Term Goals - 04/23/21 1255       PT LONG TERM GOAL #1   Title Patient will demonstrate improved function as evidenced by a score of 35 on FOTO measure for full participation in activities at home and in the community.    Baseline 11/26/20: FOTO 12.   12/23/20: FOTO 24.    01/15/21: FOTO 23.   02/10/21: FOTO 38.   03/17/21: 49.   04/23/21: 30.    Time 16    Period Weeks    Status Partially Met   previously had score > 35   Target Date 06/04/21      PT LONG TERM GOAL #2   Title Patient will ambulate for 150 feet without LOB, with sufficient toe clearance to prevent loss of gait stability, and c proper AD placement with least-restrictive assistive device with supervision level of assist    Baseline 11/26/20: Gait x 40 feet with CGA.   12/23/20: CGA with dec toe clearance L>RLE.   01/15/21: Ambulated 160 feet without LOB, proper AD placement; good toe clearance with first 80 feet, but decrased L>R toe clearanc with fatigue.   02/10/21: Performed with FWW with good toe clearance with first 80 feet, decreasing L toe clearance with succesive steps.   03/17/21: Performed without LOB, no significant instability with bilateral UE support on front-wheel walker; decreased toe clearance during gait L>R.  04/23/21: Performed with no LOB, proper placement of FWW, and sufficient toe clearance to negotiate doorway threshold, sidewalk, and change in surface elevation.    Time 12    Period Weeks    Status Achieved    Target Date 04/10/21      PT LONG TERM GOAL #3   Title Patient will perform independent re-positioning in chair with use of closed-chain  triceps on armrests as needed for independent wheelchair mobility with no verbal cueing or tactile cueing required    Baseline 11/26/20: Dependent re-positioning in wheelchair.   12/23/20: Performed with feet on fold-out  platform of her power wheelchair IND.    Time 8    Period Weeks    Status Achieved    Target Date 12/23/20      PT LONG TERM GOAL #4   Title Patient will perform simulated step-over task for clearing doorway threshold with use of front-wheeled walker as needed for entering/exiting home with supervision level of assistance and no LOB    Baseline 11/26/20: Pt currently uses power wheelchair to negotiate threshold into home.   12/23/20: Deferred.  01/15/21: Deferred.   02/10/21: Performed last week with step-over single line on floor with negligible height.    03/17/21: Performed over doorway threshold in clinic today with intermittent CGA and no LOB.  04/23/21: Performed today with supervision level of assist.    Time 16    Period Weeks    Status Achieved    Target Date 04/10/21                   Plan - 05/09/21 1042     Clinical Impression Statement Patient demonstrates further improvement in ability for negotiating various surfaces with front-wheel walker, including uneven terrain with bumps (made with large ankle weights under large exercise floor mat) and negotiating lines on floor requiring further demand on lower limb coordination and larger step length. Patient demonstrates improving ability to perform pulp to pulp grasping and fine motor work with left upper extremity (though movement is limited by partial paresis and spasticity). She is able to perform rolling with assist from stronger (right) upper extremity with minimal assist required from therapist. Pt does need further work on bed mobility to improve independent re-positioning as needed for sleep quality and prevention of pressure wounds. Patient will benefit from continued skilled therapeutic intervention per  established POC for improved function and QoL.    Personal Factors and Comorbidities Age;Comorbidity 3+;Time since onset of injury/illness/exacerbation    Comorbidities GERD, pre-diabetes, obesity, gout, s/p C3-7 ACDF    Examination-Activity Limitations Bathing;Continence;Reach Overhead;Stairs;Bed Mobility;Dressing;Self Feeding;Stand;Hygiene/Grooming;Toileting;Transfers;Locomotion Level    Examination-Participation Restrictions Community Activity;Yard Work;Laundry;Cleaning;Shop    Stability/Clinical Decision Making Evolving/Moderate complexity    Rehab Potential Fair    PT Frequency Other (comment)   2-3x/week   PT Duration Other (comment)   16 weeks   PT Treatment/Interventions Electrical Stimulation;Gait training;Stair training;Functional mobility training;Therapeutic activities;Therapeutic exercise;Balance training;Neuromuscular re-education;Wheelchair mobility training;Patient/family education    PT Next Visit Plan Gait re-training with FWW and facilitating improved independence with AD, task practice for transferring, LE strengthening, UE AROM and strengthening to assist with functional mobility and transfers.    PT Home Exercise Plan Pt continuing with home health exercises. Pt continuing with task practice for rolling, bridging, supine to sit transfers in bed; walking with supervision/decreasing level fo assist with FWW; reaching and grasping with LUE. 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,  initial encounter [facility])     Problem List Patient Active Problem List   Diagnosis Date Noted   Generalized anxiety disorder 01/24/2021   Suspected deep tissue injury 10/25/2020  Wheelchair dependence 11/27/2019   Spasticity 11/27/2019   Hyperkalemia    Steroid-induced hyperglycemia    Prediabetes    Neurogenic bowel    Neurogenic bladder    Neuropathic pain    Quadriplegia ([place]) 08/31/2019   S/P cervical spinal fusion 08/28/2019   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 [place], [place], [address] Phone: [phone]   Fax:  [phone]  Name: Meredith Roach MRN: [id number] Date of Birth: Aug 23, 1940

## 2021-05-12 ENCOUNTER — Ambulatory Visit: Payer: Medicare PPO | Admitting: Physical Therapy

## 2021-05-12 ENCOUNTER — Other Ambulatory Visit: Payer: Self-pay

## 2021-05-12 DIAGNOSIS — R262 Difficulty in walking, not elsewhere classified: Secondary | ICD-10-CM | POA: Diagnosis not present

## 2021-05-12 DIAGNOSIS — S14123A 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, initial encounter: Secondary | ICD-10-CM

## 2021-05-12 DIAGNOSIS — M6281 Muscle weakness (generalized): Secondary | ICD-10-CM

## 2021-05-12 NOTE — Therapy (Signed)
[facility] [address]. [place], [place], [address] Phone: [phone]   Fax:  [phone]  Physical Therapy Treatment  Patient Details  Name: Meredith Roach MRN: [id number] Date of Birth: [birth year] Referring Provider (PT): [name], MD   Encounter Date: 05/12/2021   PT End of Session - 05/13/21 1549     Visit Number 55    Number of Visits 60    Date for PT Re-Evaluation 03/18/21    Authorization Time Period Cert 6/96/29-09/08/39, last progress note 04/23/21; current auth 04/11/21-05/21/21    Authorization - Visit Number 9    Authorization - Number of Visits 12    Progress Note Due on Visit 54    PT Start Time 1147    PT Stop Time 1235    PT Time Calculation (min) 48 min    Equipment Utilized During Treatment Gait belt   Pt uses power wheelchair, clinic's front-wheeled walker utilized for gait   Activity Tolerance Patient tolerated treatment well    Behavior During Therapy WFL for tasks assessed/performed             Past Medical History:  Diagnosis Date   Arthritis    GERD (gastroesophageal reflux disease)    Gout    Neuromuscular disorder ([place])    Pre-diabetes     Past Surgical History:  Procedure Laterality Date   ANTERIOR CERVICAL DECOMPRESSION/DISCECTOMY FUSION 4 LEVELS N/A 08/28/2019   Procedure: ANTERIOR CERVICAL DECOMPRESSION/DISCECTOMY FUSION CERVICALTHREE-FOUR CERVICAL,FOUR-FIVE,CERVICAL FIVE-SIX,CERVICAL SIX-SEVEN.;  Surgeon: [name], MD;  Location: [facility];  Service: Neurosurgery;  Laterality: N/A;  ANTERIOR   BACK SURGERY     CATARACT EXTRACTION W/PHACO Right 03/31/2018   Procedure: CATARACT EXTRACTION PHACO AND INTRAOCULAR LENS PLACEMENT ([place]);  Surgeon: [name], MD;  Location: [facility];  Service: Ophthalmology;  Laterality: Right;  [place] 00:39.7 CDE 4.35 Fluid Pack Lot # [id number]   CHOLECYSTECTOMY  1995   COLONOSCOPY WITH PROPOFOL N/A 06/07/2017   Procedure: COLONOSCOPY WITH PROPOFOL;   Surgeon: [name], MD;  Location: [facility] ENDOSCOPY;  Service: Endoscopy;  Laterality: N/A;   ESOPHAGOGASTRODUODENOSCOPY (EGD) WITH PROPOFOL N/A 06/07/2017   Procedure: ESOPHAGOGASTRODUODENOSCOPY (EGD) WITH PROPOFOL;  Surgeon: [name], MD;  Location: [facility]COPY;  Service: Endoscopy;  Laterality: N/A;   IR CATHETER TUBE CHANGE  05/10/2020   REDUCTION MAMMAPLASTY Bilateral 1994    There were no vitals filed for this visit.   Subjective Assessment - 05/13/21 1343     Subjective Patient reports that tightness in her L upper limb and L lower extremity does improve with walking/movement. She reports intermittent burning affecting L UE/LE in spite of continued high dose of Gabapentin. Patient reports continuing her regular walking each day and home exercise.    Patient is accompained by: Family member   husband   Pertinent History Patient is a 81 year old female s/p incomplete SCI. She had incomplete SCI following fall - C4 [place] D (DOI: 08/26/19). Primary activity limitations c difficulty walking, transferring, and completing independent functional mobility following incomplete SCI. She has undergone home health therapy for about one year. She and her husband reports she was able to transfer and ambulate with walker in home with PT walking beside her. Pt has practiced with front-wheel walker at home. 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 Patient reports using steady transfer aid for performing transfers chair to bed and W/C to standard chair. Her husband states that she is able to stand with steady device  and can stand with her legs against the bed. Pt has ramp to get in her home for her power wheelchair. Patient lives in one level home. Pt uses tub bench that she can utilize to slide into shower; her daughter/husband assists with bathing. Pt has [employer] Lei that can accommodate her power chair at this time. Pt has concrete driveway to get up to her ramp. Pt has small threshold to get  into her home - husband utilizes cardboard to get over threshold.    Limitations Walking;Standing;House hold activities;Other (comment)   transferring, bed mobility, bladder management   Patient Stated Goals to be able to walk more independently               TREATMENT     Therapeutic Activities - patient education, repetitive task practice for improved performance of daily functional activities e.g. transferring, bed mobility    Ambulation through doorway threshold and along outside sidewalk with [place], around perimeter of building to bench adjacent to front door of office (negotiating curb cut x 2, decline, incline, and change in surface elevation on sidewalk); x 25 minutes, with CGA during obstacle negotiation and close supervision only on even ground   Patient independently managing components of wheelchair to initiate session: doffing safety belt, moving footrest, and doffing gait belt. Pt able to independently manage catheter drainage bag once removed from hook under power chair seat    *not today* Lateral lift over 6-inch hurdle to simulate demands of managing lower limbs during transfer from bed and car transfer; x10 with each LE Bed mobility task practice: Performed bilateral rolling on low mat with sidelying trunk lift on either side; performed R and L x 5 Ambulate laps in gym with FWW; performed x 3 with min verbal cueing for foot clearance and supervision/CGA Ambulation over exercise mat on floor to simulate negotiating change in surface elevation and uneven ground, subsequent through doorway threshold and along outside sidewalk with FWW, with 90-degree turn on sidewalk and then returning back to gym; x 6 minutes (pt negotiated threshold x 2) Car transfer simulation with lifting lower limbs over 6-inch hurdle along corner of table (transferring lower limbs over hurdle with 90-degree turn in sitting); performed x 1 over and back Performance of supine to/from sit with pt able to  assume partial sidelying position onto L forearm  Lower trunk rotations with bilateral lower limbs in hooklying  with dowel hold c bilat UE; for trunk control during rolling and re-positioning in bed; 1x12 alternating Alternating bent-knee fallout in hooklying with dowel hold bilat UE; for trunk control during rolling and bed mobility tasks; 1x15 alternating Sit to stand from raised table, 2x10 with table height, 21-inches for height of table Seated toe tapping to Airex pad; x25 alternating to simulate demands of LE management/seated hip and knee flexion needed for dressing/self-care and car transfer Dressing simulation with Theraband loop (see above); x 4 minutes Bed mobility task practice: Performed bilateral rolling on low mat with Independent roll to L x 5; roll to R x 5 [MaxA to initiate LUE cross-body shoulder adduction and to facilitate truncal rotation via pelvis, pt able to complete last 50% of roll with Minimal assist] Bridging; x25 as needed for scooting and re-positioning pelvis on bed          Neuromuscular Re-education - for increased motor recruitment of major muscle groups with motor impairment, nervous system priming and exercises to promote trunk stability     Adjacent to low mat while standing in FWW: -  Standing forward tap to Airex pad; foot flat on top of Airex 2x10; alternating L and R [simulating demands of lifting legs for bed transfers and car transfer, baseline step for stepping up to curb/stair] -Standing reaching and grasping with small cubes of varying size, cylinders, foam balls (in "Hand PT packet"); grasping and placing into adjacent container on raised treatment table; x 5 minutes (emphasis on LUE, minimal deficit with reaching/grasping RUE)           *not today* Obstacle course; stepping over uneven ground (blue mat with ankle weights under it) and stepping over blue lines on agility ladder, consecutively; 3x each for improved targeting, foot clearance, and  negotiating uneven terrain -Reaching toward sticky-notes on backside of rolling mirror, 3 targets, with no UE support; x 5 minutes [lower targets for L upper limb] Standing march with R upper extremity support only on single parallel bar, 1x10 alternating [full clearance of RLE, unweighting of LLE with inconsistent clearance from floor] Standing toe tapping onto 6-inch step to promote active dorsiflexion during LE swing and bilateral weight shift and weight acceptance; 1x15 and 1x10 bilateral LE Supine alternating knee extension and shoulder elevation (dying bug technique) from hooklying position; 2x12 alternating  Sidelying clamshell with tactile cueing at trunk for maintaining sidelying position, to facilitate active lower limb lifting in sidelying position as needed for independent sitting to supine/supine to sit transfers; x20 on each side Sidestep in FWW along length of parallel bars; 1x D/B with CGA Hurdle step over 2-lb ankle weight on floor to simulate motor control and coordination demands for clearing threshold or minor change in surface height; 2x10 each LE Standing toe tapping onto 6-inch step to promote active dorsiflexion during LE swing and bilateral weight shift and weight acceptance; x20 bilateral LE Minisquat at edge of table; 2x10 with therapist guarding posteriorly in case of loss of descending control Unilateral upper extremity reaching and grasping in standing position with minimal UE support, with bilateral fine motor task, pulp to pulp grasp for metal nuts, moving to adjacent container requiring cross-body reaching; x 2 min Standing heel-to-toe step over blue line; alternating LE; 2x10 alternating            ASSESSMENT Patient is able to substantially increase walking duration and distance with ambulation around half of the perimeter of the office building today with negotiation of various surfaces including incline, decline, and uneven sidewalk. She demonstrates good toe  clearance until significant fatigue at end of ambulation trial during which patient demonstrates toe drag on L side. Patient is continuing to improve with independence with use of AD, but she still requires close CGA when ambulating with FWW outside. She demonstrates improving ability to perform reaching and grasping with varying size and shape of items. Patient will benefit from continued skilled therapeutic intervention per established POC for improved function and QoL.        PT Short Term Goals - 04/23/21 1254       PT SHORT TERM GOAL #1   Title Patient will perform stand-pivot transfer from chair to bed with supervision level of assist and ModI setup of power chair as needed for transferring at home    Baseline 11/26/20: Max assist for setup and mod assist to perform sit to stand, unsteady pivot prior to performing stand to sit.    12/23/20: Patient is able to setup power chair for transfer ModI. Mod to Max assist for sit to stand. CGA level of assist for standing pivot and stand to  sit on low mat.   01/15/21: Setup of power chair without assist; modified independent pivot and stand-to-sit; ModA for initial set to stand prior to pivot.  02/10/21: ModI setup of power chair, ModA for initiation of sit to stand, modified-independent stand pivot transfer.     03/17/21: Performed with CGA during sit to stand only and supervision level of assist for remainder of stand-pivot transfer (performing from power chair to table).   04/23/21: Pt performed stand-pivot transfer from power chair to low mat with CGA only needed during sit to stand to guard posteriorly.    Time 4    Period Weeks    Status Partially Met    Target Date 04/07/21      PT SHORT TERM GOAL #2   Title Patient will perform standing toe tap to 3-inch surface (Airex pad) with bilateral upper extremity support indicative of improved ability to clear feet from floor during stepping and improved weight shift to each LE with AD use    Baseline  11/26/20: Limited toe clearance during bilateral LE swing phase with front-wheeled walker.   12/23/20: performed at previous follow-up with bilateral UE support.    Time 3    Period Weeks    Status Achieved    Target Date 12/17/20               PT Long Term Goals - 04/23/21 1255       PT LONG TERM GOAL #1   Title Patient will demonstrate improved function as evidenced by a score of 35 on FOTO measure for full participation in activities at home and in the community.    Baseline 11/26/20: FOTO 12.   12/23/20: FOTO 24.    01/15/21: FOTO 23.   02/10/21: FOTO 38.   03/17/21: 49.   04/23/21: 30.    Time 16    Period Weeks    Status Partially Met   previously had score > 35   Target Date 06/04/21      PT LONG TERM GOAL #2   Title Patient will ambulate for 150 feet without LOB, with sufficient toe clearance to prevent loss of gait stability, and c proper AD placement with least-restrictive assistive device with supervision level of assist    Baseline 11/26/20: Gait x 40 feet with CGA.   12/23/20: CGA with dec toe clearance L>RLE.   01/15/21: Ambulated 160 feet without LOB, proper AD placement; good toe clearance with first 80 feet, but decrased L>R toe clearanc with fatigue.   02/10/21: Performed with FWW with good toe clearance with first 80 feet, decreasing L toe clearance with succesive steps.   03/17/21: Performed without LOB, no significant instability with bilateral UE support on front-wheel walker; decreased toe clearance during gait L>R.  04/23/21: Performed with no LOB, proper placement of FWW, and sufficient toe clearance to negotiate doorway threshold, sidewalk, and change in surface elevation.    Time 12    Period Weeks    Status Achieved    Target Date 04/10/21      PT LONG TERM GOAL #3   Title Patient will perform independent re-positioning in chair with use of closed-chain triceps on armrests as needed for independent wheelchair mobility with no verbal cueing or tactile cueing required     Baseline 11/26/20: Dependent re-positioning in wheelchair.   12/23/20: Performed with feet on fold-out platform of her power wheelchair IND.    Time 8    Period Weeks    Status Achieved  Target Date 12/23/20      PT LONG TERM GOAL #4   Title Patient will perform simulated step-over task for clearing doorway threshold with use of front-wheeled walker as needed for entering/exiting home with supervision level of assistance and no LOB    Baseline 11/26/20: Pt currently uses power wheelchair to negotiate threshold into home.   12/23/20: Deferred.  01/15/21: Deferred.   02/10/21: Performed last week with step-over single line on floor with negligible height.    03/17/21: Performed over doorway threshold in clinic today with intermittent CGA and no LOB.  04/23/21: Performed today with supervision level of assist.    Time 16    Period Weeks    Status Achieved    Target Date 04/10/21                   Plan - 05/13/21 1559     Clinical Impression Statement Patient is able to substantially increase walking duration and distance with ambulation around half of the perimeter of the office building today with negotiation of various surfaces including incline, decline, and uneven sidewalk. She demonstrates good toe clearance until significant fatigue at end of ambulation trial during which patient demonstrates toe drag on L side. Patient is continuing to improve with independence with use of AD, but she still requires close CGA when ambulating with FWW outside. She demonstrates improving ability to perform reaching and grasping with varying size and shape of items. Patient will benefit from continued skilled therapeutic intervention per established POC for improved function and QoL.    Personal Factors and Comorbidities Age;Comorbidity 3+;Time since onset of injury/illness/exacerbation    Comorbidities GERD, pre-diabetes, obesity, gout, s/p C3-7 ACDF    Examination-Activity Limitations  Bathing;Continence;Reach Overhead;Stairs;Bed Mobility;Dressing;Self Feeding;Stand;Hygiene/Grooming;Toileting;Transfers;Locomotion Level    Examination-Participation Restrictions Community Activity;Yard Work;Laundry;Cleaning;Shop    Stability/Clinical Decision Making Evolving/Moderate complexity    Rehab Potential Fair    PT Frequency Other (comment)   2-3x/week   PT Duration Other (comment)   16 weeks   PT Treatment/Interventions Electrical Stimulation;Gait training;Stair training;Functional mobility training;Therapeutic activities;Therapeutic exercise;Balance training;Neuromuscular re-education;Wheelchair mobility training;Patient/family education    PT Next Visit Plan Gait re-training with FWW and facilitating improved independence with AD, task practice for transferring, LE strengthening, UE AROM and strengthening to assist with functional mobility and transfers.    PT Home Exercise Plan Pt continuing with home health exercises. Pt continuing with task practice for rolling, bridging, supine to sit transfers in bed; walking with supervision/decreasing level fo assist with FWW; reaching and grasping with LUE. 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, initial encounter [facility])     Problem List Patient Active Problem List   Diagnosis Date Noted   Generalized anxiety disorder 01/24/2021   Suspected deep tissue injury 10/25/2020   Wheelchair dependence 11/27/2019   Spasticity 11/27/2019   Hyperkalemia     Steroid-induced hyperglycemia    Prediabetes    Neurogenic bowel    Neurogenic bladder    Neuropathic pain    Quadriplegia ([place]) 08/31/2019   S/P  cervical spinal fusion 08/28/2019   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 [place], [place], [address] Phone: [phone]   Fax:  [phone]  Name: BETTIE CAPISTRAN MRN: [id number] Date of Birth: Jul 07, 1940

## 2021-05-13 ENCOUNTER — Encounter: Payer: Self-pay | Admitting: Physical Therapy

## 2021-05-14 ENCOUNTER — Encounter: Payer: Self-pay | Admitting: Physical Therapy

## 2021-05-14 ENCOUNTER — Ambulatory Visit: Payer: Medicare PPO | Attending: Physical Medicine and Rehabilitation | Admitting: Physical Therapy

## 2021-05-14 ENCOUNTER — Other Ambulatory Visit: Payer: Self-pay

## 2021-05-14 DIAGNOSIS — M6281 Muscle weakness (generalized): Secondary | ICD-10-CM | POA: Diagnosis present

## 2021-05-14 DIAGNOSIS — S14123A 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, initial encounter: Secondary | ICD-10-CM | POA: Insufficient documentation

## 2021-05-14 DIAGNOSIS — R262 Difficulty in walking, not elsewhere classified: Secondary | ICD-10-CM | POA: Diagnosis not present

## 2021-05-14 NOTE — Therapy (Signed)
[facility] [address]. [place], [place], [address] Phone: [phone]   Fax:  [phone]  Physical Therapy Treatment  Patient Details  Name: Meredith Roach MRN: [id number] Date of Birth: 1940-07-31 Referring Provider (PT): [name], MD   Encounter Date: 05/14/2021   PT End of Session - 05/14/21 1320     Visit Number 43    Number of Visits 60    Date for PT Re-Evaluation 03/18/21    Authorization Time Period Cert 4/40/34-7/42/59, last progress note 04/23/21; current auth 04/11/21-05/21/21    Authorization - Visit Number 9    Authorization - Number of Visits 12    Progress Note Due on Visit 61    PT Start Time 1146    PT Stop Time 5638    PT Time Calculation (min) 49 min    Equipment Utilized During Treatment Gait belt   Pt uses power wheelchair, clinic's front-wheeled walker utilized for gait   Activity Tolerance Patient tolerated treatment well    Behavior During Therapy WFL for tasks assessed/performed             Past Medical History:  Diagnosis Date   Arthritis    GERD (gastroesophageal reflux disease)    Gout    Neuromuscular disorder ([place])    Pre-diabetes     Past Surgical History:  Procedure Laterality Date   ANTERIOR CERVICAL DECOMPRESSION/DISCECTOMY FUSION 4 LEVELS N/A 08/28/2019   Procedure: ANTERIOR CERVICAL DECOMPRESSION/DISCECTOMY FUSION CERVICALTHREE-FOUR CERVICAL,FOUR-FIVE,CERVICAL FIVE-SIX,CERVICAL SIX-SEVEN.;  Surgeon: [name], MD;  Location: [place];  Service: Neurosurgery;  Laterality: N/A;  ANTERIOR   BACK SURGERY     CATARACT EXTRACTION W/PHACO Right 03/31/2018   Procedure: CATARACT EXTRACTION PHACO AND INTRAOCULAR LENS PLACEMENT ([place]);  Surgeon: [name], MD;  Location: [facility];  Service: Ophthalmology;  Laterality: Right;  [place] 00:39.7 CDE 4.35 Fluid Pack Lot # [id number]   CHOLECYSTECTOMY  1995   COLONOSCOPY WITH PROPOFOL N/A 06/07/2017   Procedure: COLONOSCOPY WITH PROPOFOL;   Surgeon: [name], MD;  Location: [facility] ENDOSCOPY;  Service: Endoscopy;  Laterality: N/A;   ESOPHAGOGASTRODUODENOSCOPY (EGD) WITH PROPOFOL N/A 06/07/2017   Procedure: ESOPHAGOGASTRODUODENOSCOPY (EGD) WITH PROPOFOL;  Surgeon: [name], MD;  Location: [facility] ENDOSCOPY;  Service: Endoscopy;  Laterality: N/A;   IR CATHETER TUBE CHANGE  05/10/2020   REDUCTION MAMMAPLASTY Bilateral 1994    There were no vitals filed for this visit.   Subjective Assessment - 05/14/21 1326     Subjective Patient reports feeling better than she did earlier this week. She expected to feel more tight/stiff with inclement cold weather. She reports no major issues after significant increase in walking volume last session. Pt is compliant with home exercise program.    Patient is accompained by: Family member   husband   Pertinent History Patient is a 81 year old Meredith s/p incomplete SCI. She had incomplete SCI following fall - C4 [place] D (DOI: 08/26/19). Primary activity limitations c difficulty walking, transferring, and completing independent functional mobility following incomplete SCI. She has undergone home health therapy for about one year. She and her husband reports she was able to transfer and ambulate with walker in home with PT walking beside her. Pt has practiced with front-wheel walker at home. 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 Patient reports using steady transfer aid for performing transfers chair to bed and W/C to standard chair. Her husband states that she is able to stand with steady device and can stand  with her legs against the bed. Pt has ramp to get in her home for her power wheelchair. Patient lives in one level home. Pt uses tub bench that she can utilize to slide into shower; her daughter/husband assists with bathing. Pt has [employer] Lei that can accommodate her power chair at this time. Pt has concrete driveway to get up to her ramp. Pt has small threshold to get into her home -  husband utilizes cardboard to get over threshold.    Limitations Walking;Standing;House hold activities;Other (comment)   transferring, bed mobility, bladder management   Patient Stated Goals to be able to walk more independently                TREATMENT     Therapeutic Activities - patient education, repetitive task practice for improved performance of daily functional activities e.g. transferring, bed mobility    Patient independently managed components of wheelchair to initiate session: doffing safety belt, moving footrest, and doffing gait belt. Pt able to independently manage catheter drainage bag once removed from hook under power chair seat   Lower trunk rotations with bilateral lower limbs in hooklying  with bilat shoulders at 90 deg flexion; for trunk control during rolling and re-positioning in bed; 2x15 alternating  Patient demonstrated bridging in supine with pt able to unweight pelvis, but pt does not demonstrate complete clearance of hips from table   Bed mobility task practice: Performed bilateral rolling on low mat with sidelying trunk lift on either side; performed R and L x 5 Pt demonstrated independent rolling to left with use of R upper limb pulling on external support (used therapist's hand to simulate grabbing stick held by spouse or railing for carryover to home). ModA to right with contact at L ileum to assist R rolling with R upper limb pulling.    *not today* Ambulation through doorway threshold and along outside sidewalk with FWW, around perimeter of building to bench adjacent to front door of office (negotiating curb cut x 2, decline, incline, and change in surface elevation on sidewalk); x 25 minutes, with CGA during obstacle negotiation and close supervision only on even ground Lateral lift over 6-inch hurdle to simulate demands of managing lower limbs during transfer from bed and car transfer; x10 with each LE  Ambulate laps in gym with FWW; performed x 3  with min verbal cueing for foot clearance and supervision/CGA Car transfer simulation with lifting lower limbs over 6-inch hurdle along corner of table (transferring lower limbs over hurdle with 90-degree turn in sitting); performed x 1 over and back Performance of supine to/from sit with pt able to assume partial sidelying position onto L forearm  Alternating bent-knee fallout in hooklying with dowel hold bilat UE; for trunk control during rolling and bed mobility tasks; 1x15 alternating Sit to stand from raised table, 2x10 with table height, 21-inches for height of table Bridging; x25 as needed for scooting and re-positioning pelvis on bed          Neuromuscular Re-education - for increased motor recruitment of major muscle groups with motor impairment, nervous system priming and exercises to promote trunk stability     Adjacent to low mat while standing in FWW: -Standing reaching and grasping with small cubes of varying size, cylinders, foam balls (in "Hand PT packet"); grasping and placing into adjacent container on raised treatment table; x 5 minutes (emphasis on LUE, minimal deficit with reaching/grasping RUE)   Obstacle course; stepping over uneven ground (blue mat with ankle weights under  it) and stepping over blue lines on agility ladder, consecutively; 4x each for improved targeting, foot clearance, and negotiating uneven terrain      *next visit* -Standing forward tap to Airex pad; foot flat on top of Airex 2x10; alternating L and R [simulating demands of lifting legs for bed transfers and car transfer, baseline step for stepping up to curb/stair]     *not today* -Reaching toward sticky-notes on backside of rolling mirror, 3 targets, with no UE support; x 5 minutes [lower targets for L upper limb] Standing march with R upper extremity support only on single parallel bar, 1x10 alternating [full clearance of RLE, unweighting of LLE with inconsistent clearance from floor] Standing  toe tapping onto 6-inch step to promote active dorsiflexion during LE swing and bilateral weight shift and weight acceptance; 1x15 and 1x10 bilateral LE Supine alternating knee extension and shoulder elevation (dying bug technique) from hooklying position; 2x12 alternating  Sidelying clamshell with tactile cueing at trunk for maintaining sidelying position, to facilitate active lower limb lifting in sidelying position as needed for independent sitting to supine/supine to sit transfers; x20 on each side Sidestep in FWW along length of parallel bars; 1x D/B with CGA Hurdle step over 2-lb ankle weight on floor to simulate motor control and coordination demands for clearing threshold or minor change in surface height; 2x10 each LE Standing toe tapping onto 6-inch step to promote active dorsiflexion during LE swing and bilateral weight shift and weight acceptance; x20 bilateral LE Minisquat at edge of table; 2x10 with therapist guarding posteriorly in case of loss of descending control Unilateral upper extremity reaching and grasping in standing position with minimal UE support, with bilateral fine motor task, pulp to pulp grasp for metal nuts, moving to adjacent container requiring cross-body reaching; x 2 min Standing heel-to-toe step over blue line; alternating LE; 2x10 alternating            ASSESSMENT Patient continues to demonstrate improved ability to perform pulp to pulp grasping during reaching and grasping drills as demonstrated by decreased number of attempts required to grab and manipulate each item (pt placing item into adjacent container requiring cross-body adduction of L shoulder). Patient demonstrates safe negotiation of uneven terrain today and improving step length and toe clearance during gait trials. Patient will benefit from continued skilled therapeutic intervention per established POC for improved function and QoL.     PT Short Term Goals - 04/23/21 1254       PT SHORT TERM  GOAL #1   Title Patient will perform stand-pivot transfer from chair to bed with supervision level of assist and ModI setup of power chair as needed for transferring at home    Baseline 11/26/20: Max assist for setup and mod assist to perform sit to stand, unsteady pivot prior to performing stand to sit.    12/23/20: Patient is able to setup power chair for transfer ModI. Mod to Max assist for sit to stand. CGA level of assist for standing pivot and stand to sit on low mat.   01/15/21: Setup of power chair without assist; modified independent pivot and stand-to-sit; ModA for initial set to stand prior to pivot.  02/10/21: ModI setup of power chair, ModA for initiation of sit to stand, modified-independent stand pivot transfer.     03/17/21: Performed with CGA during sit to stand only and supervision level of assist for remainder of stand-pivot transfer (performing from power chair to table).   04/23/21: Pt performed stand-pivot transfer from power chair  to low mat with CGA only needed during sit to stand to guard posteriorly.    Time 4    Period Weeks    Status Partially Met    Target Date 04/07/21      PT SHORT TERM GOAL #2   Title Patient will perform standing toe tap to 3-inch surface (Airex pad) with bilateral upper extremity support indicative of improved ability to clear feet from floor during stepping and improved weight shift to each LE with AD use    Baseline 11/26/20: Limited toe clearance during bilateral LE swing phase with front-wheeled walker.   12/23/20: performed at previous follow-up with bilateral UE support.    Time 3    Period Weeks    Status Achieved    Target Date 12/17/20               PT Long Term Goals - 04/23/21 1255       PT LONG TERM GOAL #1   Title Patient will demonstrate improved function as evidenced by a score of 35 on FOTO measure for full participation in activities at home and in the community.    Baseline 11/26/20: FOTO 12.   12/23/20: FOTO 24.    01/15/21: FOTO  23.   02/10/21: FOTO 38.   03/17/21: 49.   04/23/21: 30.    Time 16    Period Weeks    Status Partially Met   previously had score > 35   Target Date 06/04/21      PT LONG TERM GOAL #2   Title Patient will ambulate for 150 feet without LOB, with sufficient toe clearance to prevent loss of gait stability, and c proper AD placement with least-restrictive assistive device with supervision level of assist    Baseline 11/26/20: Gait x 40 feet with CGA.   12/23/20: CGA with dec toe clearance L>RLE.   01/15/21: Ambulated 160 feet without LOB, proper AD placement; good toe clearance with first 80 feet, but decrased L>R toe clearanc with fatigue.   02/10/21: Performed with FWW with good toe clearance with first 80 feet, decreasing L toe clearance with succesive steps.   03/17/21: Performed without LOB, no significant instability with bilateral UE support on front-wheel walker; decreased toe clearance during gait L>R.  04/23/21: Performed with no LOB, proper placement of FWW, and sufficient toe clearance to negotiate doorway threshold, sidewalk, and change in surface elevation.    Time 12    Period Weeks    Status Achieved    Target Date 04/10/21      PT LONG TERM GOAL #3   Title Patient will perform independent re-positioning in chair with use of closed-chain triceps on armrests as needed for independent wheelchair mobility with no verbal cueing or tactile cueing required    Baseline 11/26/20: Dependent re-positioning in wheelchair.   12/23/20: Performed with feet on fold-out platform of her power wheelchair IND.    Time 8    Period Weeks    Status Achieved    Target Date 12/23/20      PT LONG TERM GOAL #4   Title Patient will perform simulated step-over task for clearing doorway threshold with use of front-wheeled walker as needed for entering/exiting home with supervision level of assistance and no LOB    Baseline 11/26/20: Pt currently uses power wheelchair to negotiate threshold into home.   12/23/20:  Deferred.  01/15/21: Deferred.   02/10/21: Performed last week with step-over single line on floor with negligible height.  03/17/21: Performed over doorway threshold in clinic today with intermittent CGA and no LOB.  04/23/21: Performed today with supervision level of assist.    Time 16    Period Weeks    Status Achieved    Target Date 04/10/21                   Plan - 05/14/21 1335     Clinical Impression Statement Patient continues to demonstrate improved ability to perform pulp to pulp grasping during reaching and grasping drills as demonstrated by decreased number of attempts required to grab and manipulate each item (pt placing item into adjacent container requiring cross-body adduction of L shoulder). Patient demonstrates safe negotiation of uneven terrain today and improving step length and toe clearance during gait trials. Patient will benefit from continued skilled therapeutic intervention per established POC for improved function and QoL.    Personal Factors and Comorbidities Age;Comorbidity 3+;Time since onset of injury/illness/exacerbation    Comorbidities GERD, pre-diabetes, obesity, gout, s/p C3-7 ACDF    Examination-Activity Limitations Bathing;Continence;Reach Overhead;Stairs;Bed Mobility;Dressing;Self Feeding;Stand;Hygiene/Grooming;Toileting;Transfers;Locomotion Level    Examination-Participation Restrictions Community Activity;Yard Work;Laundry;Cleaning;Shop    Stability/Clinical Decision Making Evolving/Moderate complexity    Rehab Potential Fair    PT Frequency Other (comment)   2-3x/week   PT Duration Other (comment)   16 weeks   PT Treatment/Interventions Electrical Stimulation;Gait training;Stair training;Functional mobility training;Therapeutic activities;Therapeutic exercise;Balance training;Neuromuscular re-education;Wheelchair mobility training;Patient/family education    PT Next Visit Plan Gait re-training with FWW and facilitating improved independence with  AD, task practice for transferring, LE strengthening, UE AROM and strengthening to assist with functional mobility and transfers.    PT Home Exercise Plan Pt continuing with home health exercises. Pt continuing with task practice for rolling, bridging, supine to sit transfers in bed; walking with supervision/decreasing level fo assist with FWW; reaching and grasping with LUE. 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, initial encounter [facility])     Problem List Patient Active Problem List   Diagnosis Date Noted   Generalized anxiety disorder 01/24/2021   Suspected deep tissue injury 10/25/2020   Wheelchair dependence 11/27/2019   Spasticity 11/27/2019   Hyperkalemia    Steroid-induced hyperglycemia    Prediabetes    Neurogenic bowel    Neurogenic bladder    Neuropathic pain    Quadriplegia ([place]) 08/31/2019   S/P cervical spinal fusion 08/28/2019   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 [place], [place], [address] Phone: [phone]   Fax:  [phone]  Name: Meredith Roach MRN: [id number] Date of Birth: 01/03/1941

## 2021-05-19 ENCOUNTER — Encounter: Payer: Self-pay | Admitting: Physical Therapy

## 2021-05-19 ENCOUNTER — Ambulatory Visit: Payer: Medicare PPO | Admitting: Physical Therapy

## 2021-05-19 ENCOUNTER — Other Ambulatory Visit: Payer: Self-pay

## 2021-05-19 DIAGNOSIS — S14123A 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, initial encounter: Secondary | ICD-10-CM

## 2021-05-19 DIAGNOSIS — R262 Difficulty in walking, not elsewhere classified: Secondary | ICD-10-CM

## 2021-05-19 DIAGNOSIS — M6281 Muscle weakness (generalized): Secondary | ICD-10-CM

## 2021-05-19 NOTE — Therapy (Signed)
[facility] [address]. [place], [place], [address] Phone: [phone]   Fax:  [phone]  Physical Therapy Treatment  Patient Details  Name: Meredith Roach MRN: [id number] Date of Birth: 30-Jan-1941 Referring Provider (PT): [name], MD   Encounter Date: 05/19/2021   PT End of Session - 05/19/21 0102     Visit Number 15    Number of Visits 60    Date for PT Re-Evaluation 03/18/21    Authorization Time Period Cert 11/05/34-6/44/03, last progress note 04/23/21; current auth 04/11/21-05/21/21    Authorization - Visit Number 10    Authorization - Number of Visits 12    Progress Note Due on Visit 55    PT Start Time 1150    PT Stop Time 1235    PT Time Calculation (min) 45 min    Equipment Utilized During Treatment Gait belt   Pt uses power wheelchair, clinic's front-wheeled walker utilized for gait   Activity Tolerance Patient tolerated treatment well    Behavior During Therapy WFL for tasks assessed/performed             Past Medical History:  Diagnosis Date   Arthritis    GERD (gastroesophageal reflux disease)    Gout    Neuromuscular disorder ([place])    Pre-diabetes     Past Surgical History:  Procedure Laterality Date   ANTERIOR CERVICAL DECOMPRESSION/DISCECTOMY FUSION 4 LEVELS N/A 08/28/2019   Procedure: ANTERIOR CERVICAL DECOMPRESSION/DISCECTOMY FUSION CERVICALTHREE-FOUR CERVICAL,FOUR-FIVE,CERVICAL FIVE-SIX,CERVICAL SIX-SEVEN.;  Surgeon: [name], MD;  Location: [place];  Service: Neurosurgery;  Laterality: N/A;  ANTERIOR   BACK SURGERY     CATARACT EXTRACTION W/PHACO Right 03/31/2018   Procedure: CATARACT EXTRACTION PHACO AND INTRAOCULAR LENS PLACEMENT ([place]);  Surgeon: [name], MD;  Location: [facility];  Service: Ophthalmology;  Laterality: Right;  [place] 00:39.7 CDE 4.35 Fluid Pack Lot # [id number]   CHOLECYSTECTOMY  1995   COLONOSCOPY WITH PROPOFOL N/A 06/07/2017   Procedure: COLONOSCOPY WITH PROPOFOL;   Surgeon: [name], MD;  Location[facility];  Service: Endoscopy;  Laterality: N/A;   ESOPHAGOGASTRODUODENOSCOPY (EGD) WITH PROPOFOL N/A 06/07/2017   Procedure: ESOPHAGOGASTRODUODENOSCOPY (EGD) WITH PROPOFOL;  Surgeon: [name], MD;  Location: [facility];  Service: Endoscopy;  Laterality: N/A;   IR CATHETER TUBE CHANGE  05/10/2020   REDUCTION MAMMAPLASTY Bilateral 1994    There were no vitals filed for this visit.   Subjective Assessment - 05/19/21 1154     Subjective Patient reports doing well this morning. She reports feeling tight in her left lower limb - this has improved with walking and movement/exercise. Patient reports performing toe tap with foot flat onto surface at home while standing in her walker. Patient reports completing lying exercises including dowel holding and supine lower trunk rotations.    Patient is accompained by: Family member   husband   Pertinent History Patient is a 81 year old female s/p incomplete SCI. She had incomplete SCI following fall - C4 [place] D (DOI: 08/26/19). Primary activity limitations c difficulty walking, transferring, and completing independent functional mobility following incomplete SCI. She has undergone home health therapy for about one year. She and her husband reports she was able to transfer and ambulate with walker in home with PT walking beside her. Pt has practiced with front-wheel walker at home. 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 Patient reports using steady transfer aid for performing transfers chair to bed and W/C to standard chair. Her husband  states that she is able to stand with steady device and can stand with her legs against the bed. Pt has ramp to get in her home for her power wheelchair. Patient lives in one level home. Pt uses tub bench that she can utilize to slide into shower; her daughter/husband assists with bathing. Pt has [employer] Lei that can accommodate her power chair at this time. Pt has  concrete driveway to get up to her ramp. Pt has small threshold to get into her home - husband utilizes cardboard to get over threshold.    Limitations Walking;Standing;House hold activities;Other (comment)   transferring, bed mobility, bladder management   Patient Stated Goals to be able to walk more independently                TREATMENT     Therapeutic Activities - patient education, repetitive task practice for improved performance of daily functional activities e.g. transferring, bed mobility, gait with various textures/surfaces   Patient independently managed components of wheelchair to initiate session: doffing safety belt, moving footrest, and doffing gait belt. Pt able to independently manage catheter drainage bag once removed from hook under power chair seat    Ambulation through doorway threshold and along outside sidewalk with FWW, around perimeter of building to bench adjacent to front door of office (negotiating curb cut x 2, decline, incline, and change in surface elevation on sidewalk); x 25 minutes, with CGA during obstacle negotiation and close supervision only on even ground  Modified-independent stand-pivot transfer with FWW performed from chair to low mat at beginning of session and performed from low mat to chair at end of session today     *next visit* Lower trunk rotations with bilateral lower limbs in hooklying  with bilat shoulders at 90 deg flexion; for trunk control during rolling and re-positioning in bed; 2x15 alternating Bed mobility task practice: Performed bilateral rolling on low mat with sidelying trunk lift on either side; performed R and L x 5     *not today* Lateral lift over 6-inch hurdle to simulate demands of managing lower limbs during transfer from bed and car transfer; x10 with each LE Ambulate laps in gym with FWW; performed x 3 with min verbal cueing for foot clearance and supervision/CGA Car transfer simulation with lifting lower limbs  over 6-inch hurdle along corner of table (transferring lower limbs over hurdle with 90-degree turn in sitting); performed x 1 over and back Performance of supine to/from sit with pt able to assume partial sidelying position onto L forearm  Alternating bent-knee fallout in hooklying with dowel hold bilat UE; for trunk control during rolling and bed mobility tasks; 1x15 alternating Sit to stand from raised table, 2x10 with table height, 21-inches for height of table Bridging; x25 as needed for scooting and re-positioning pelvis on bed          Neuromuscular Re-education - for increased motor recruitment of major muscle groups with motor impairment, nervous system priming and exercises to promote trunk stability     Adjacent to low mat while standing in FWW: -Standing reaching and grasping with small cubes of varying size, cylinders, foam balls (in "Hand PT packet"); grasping and placing into adjacent container on raised treatment table; x 5 minutes (emphasis on LUE, minimal deficit with reaching/grasping RUE)   -Standing forward tap, 2 stacked Airex pads; 2x10; alternating L and R [simulating demands of lifting legs for bed transfers and car transfer, baseline step for stepping up to curb/stair]       *  not today* Obstacle course; stepping over uneven ground (blue mat with ankle weights under it) and stepping over blue lines on agility ladder, consecutively; 4x each for improved targeting, foot clearance, and negotiating uneven terrain -Reaching toward sticky-notes on backside of rolling mirror, 3 targets, with no UE support; x 5 minutes [lower targets for L upper limb] Standing march with R upper extremity support only on single parallel bar, 1x10 alternating [full clearance of RLE, unweighting of LLE with inconsistent clearance from floor] Standing toe tapping onto 6-inch step to promote active dorsiflexion during LE swing and bilateral weight shift and weight acceptance; 1x15 and 1x10 bilateral  LE Supine alternating knee extension and shoulder elevation (dying bug technique) from hooklying position; 2x12 alternating  Sidelying clamshell with tactile cueing at trunk for maintaining sidelying position, to facilitate active lower limb lifting in sidelying position as needed for independent sitting to supine/supine to sit transfers; x20 on each side Sidestep in FWW along length of parallel bars; 1x D/B with CGA Hurdle step over 2-lb ankle weight on floor to simulate motor control and coordination demands for clearing threshold or minor change in surface height; 2x10 each LE Standing toe tapping onto 6-inch step to promote active dorsiflexion during LE swing and bilateral weight shift and weight acceptance; x20 bilateral LE Minisquat at edge of table; 2x10 with therapist guarding posteriorly in case of loss of descending control Unilateral upper extremity reaching and grasping in standing position with minimal UE support, with bilateral fine motor task, pulp to pulp grasp for metal nuts, moving to adjacent container requiring cross-body reaching; x 2 min Standing heel-to-toe step over blue line; alternating LE; 2x10 alternating            ASSESSMENT Patient is able to complete long-duration walk again going around half or perimeter of building including negotiating of incline, decline, curb cuts, and change in sidewalk elevation without loss of gait stability and adequate toe clearance. She does exhibit toe drag on L side following significant fatigue at end of her walk today. She continues to demonstrate modified-independence with most aspects of managing components of her wheelchair and stand-pivot transfer from power chair to adjacent seat. Patient does have difficulty raising L>R foot up to 6-inch step as needed for performing step/curb negotiation. Patient will benefit from continued skilled therapeutic intervention per established POC for improved function and QoL.      PT Short Term  Goals - 04/23/21 1254       PT SHORT TERM GOAL #1   Title Patient will perform stand-pivot transfer from chair to bed with supervision level of assist and ModI setup of power chair as needed for transferring at home    Baseline 11/26/20: Max assist for setup and mod assist to perform sit to stand, unsteady pivot prior to performing stand to sit.    12/23/20: Patient is able to setup power chair for transfer ModI. Mod to Max assist for sit to stand. CGA level of assist for standing pivot and stand to sit on low mat.   01/15/21: Setup of power chair without assist; modified independent pivot and stand-to-sit; ModA for initial set to stand prior to pivot.  02/10/21: ModI setup of power chair, ModA for initiation of sit to stand, modified-independent stand pivot transfer.     03/17/21: Performed with CGA during sit to stand only and supervision level of assist for remainder of stand-pivot transfer (performing from power chair to table).   04/23/21: Pt performed stand-pivot transfer from power chair to  low mat with CGA only needed during sit to stand to guard posteriorly.    Time 4    Period Weeks    Status Partially Met    Target Date 04/07/21      PT SHORT TERM GOAL #2   Title Patient will perform standing toe tap to 3-inch surface (Airex pad) with bilateral upper extremity support indicative of improved ability to clear feet from floor during stepping and improved weight shift to each LE with AD use    Baseline 11/26/20: Limited toe clearance during bilateral LE swing phase with front-wheeled walker.   12/23/20: performed at previous follow-up with bilateral UE support.    Time 3    Period Weeks    Status Achieved    Target Date 12/17/20               PT Long Term Goals - 04/23/21 1255       PT LONG TERM GOAL #1   Title Patient will demonstrate improved function as evidenced by a score of 35 on FOTO measure for full participation in activities at home and in the community.    Baseline 11/26/20:  FOTO 12.   12/23/20: FOTO 24.    01/15/21: FOTO 23.   02/10/21: FOTO 38.   03/17/21: 49.   04/23/21: 30.    Time 16    Period Weeks    Status Partially Met   previously had score > 35   Target Date 06/04/21      PT LONG TERM GOAL #2   Title Patient will ambulate for 150 feet without LOB, with sufficient toe clearance to prevent loss of gait stability, and c proper AD placement with least-restrictive assistive device with supervision level of assist    Baseline 11/26/20: Gait x 40 feet with CGA.   12/23/20: CGA with dec toe clearance L>RLE.   01/15/21: Ambulated 160 feet without LOB, proper AD placement; good toe clearance with first 80 feet, but decrased L>R toe clearanc with fatigue.   02/10/21: Performed with FWW with good toe clearance with first 80 feet, decreasing L toe clearance with succesive steps.   03/17/21: Performed without LOB, no significant instability with bilateral UE support on front-wheel walker; decreased toe clearance during gait L>R.  04/23/21: Performed with no LOB, proper placement of FWW, and sufficient toe clearance to negotiate doorway threshold, sidewalk, and change in surface elevation.    Time 12    Period Weeks    Status Achieved    Target Date 04/10/21      PT LONG TERM GOAL #3   Title Patient will perform independent re-positioning in chair with use of closed-chain triceps on armrests as needed for independent wheelchair mobility with no verbal cueing or tactile cueing required    Baseline 11/26/20: Dependent re-positioning in wheelchair.   12/23/20: Performed with feet on fold-out platform of her power wheelchair IND.    Time 8    Period Weeks    Status Achieved    Target Date 12/23/20      PT LONG TERM GOAL #4   Title Patient will perform simulated step-over task for clearing doorway threshold with use of front-wheeled walker as needed for entering/exiting home with supervision level of assistance and no LOB    Baseline 11/26/20: Pt currently uses power wheelchair to  negotiate threshold into home.   12/23/20: Deferred.  01/15/21: Deferred.   02/10/21: Performed last week with step-over single line on floor with negligible height.    03/17/21:  Performed over doorway threshold in clinic today with intermittent CGA and no LOB.  04/23/21: Performed today with supervision level of assist.    Time 16    Period Weeks    Status Achieved    Target Date 04/10/21                   Plan - 05/20/21 1300     Clinical Impression Statement Patient is able to complete long-duration walk again going around half or perimeter of building including negotiating of incline, decline, curb cuts, and change in sidewalk elevation without loss of gait stability and adequate toe clearance. She does exhibit toe drag on L side following significant fatigue at end of her walk today. She continues to demonstrate modified-independence with most aspects of managing components of her wheelchair and stand-pivot transfer from power chair to adjacent seat. Patient does have difficulty raising L>R foot up to 6-inch step as needed for performing step/curb negotiation. Patient will benefit from continued skilled therapeutic intervention per established POC for improved function and QoL.    Personal Factors and Comorbidities Age;Comorbidity 3+;Time since onset of injury/illness/exacerbation    Comorbidities GERD, pre-diabetes, obesity, gout, s/p C3-7 ACDF    Examination-Activity Limitations Bathing;Continence;Reach Overhead;Stairs;Bed Mobility;Dressing;Self Feeding;Stand;Hygiene/Grooming;Toileting;Transfers;Locomotion Level    Examination-Participation Restrictions Community Activity;Yard Work;Laundry;Cleaning;Shop    Stability/Clinical Decision Making Evolving/Moderate complexity    Rehab Potential Fair    PT Frequency Other (comment)   2-3x/week   PT Duration Other (comment)   16 weeks   PT Treatment/Interventions Electrical Stimulation;Gait training;Stair training;Functional mobility  training;Therapeutic activities;Therapeutic exercise;Balance training;Neuromuscular re-education;Wheelchair mobility training;Patient/family education    PT Next Visit Plan Gait re-training with FWW and facilitating improved independence with AD, task practice for transferring, LE strengthening, UE AROM and strengthening to assist with functional mobility and transfers.    PT Home Exercise Plan Pt continuing with home health exercises. Pt continuing with task practice for rolling, bridging, supine to sit transfers in bed; walking with supervision/decreasing level fo assist with FWW; reaching and grasping with LUE. 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, initial encounter [facility])     Problem List Patient Active Problem List   Diagnosis Date Noted   Generalized anxiety disorder 01/24/2021   Suspected deep tissue injury 10/25/2020   Wheelchair dependence 11/27/2019   Spasticity 11/27/2019   Hyperkalemia    Steroid-induced hyperglycemia    Prediabetes    Neurogenic bowel    Neurogenic bladder    Neuropathic pain    Quadriplegia ([place]) 08/31/2019   S/P cervical spinal fusion 08/28/2019   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 [place], [place], [address] Phone: [phone]   Fax:   [phone]  Name: Meredith Roach MRN: [id number] Date of Birth: 1941-01-15

## 2021-05-21 ENCOUNTER — Other Ambulatory Visit: Payer: Self-pay

## 2021-05-21 ENCOUNTER — Ambulatory Visit: Payer: Medicare PPO | Admitting: Physical Therapy

## 2021-05-21 DIAGNOSIS — R262 Difficulty in walking, not elsewhere classified: Secondary | ICD-10-CM | POA: Diagnosis not present

## 2021-05-21 DIAGNOSIS — M6281 Muscle weakness (generalized): Secondary | ICD-10-CM

## 2021-05-21 DIAGNOSIS — S14123A 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, initial encounter: Secondary | ICD-10-CM

## 2021-05-21 NOTE — Therapy (Signed)
[facility] Meredith Roach [address] [place], [place], [address] Phone: [phone]   Fax:  [phone]  Physical Therapy Treatment  Patient Details  Name: Meredith Roach MRN: [id number] Date of Birth: [birth year] Referring Provider (PT): [name], MD   Encounter Date: 05/21/2021   PT End of Session - 05/22/21 1119     Visit Number 76    Number of Visits 60    Date for PT Re-Evaluation 03/18/21    Authorization Time Period Cert 9/51/88-07/28/58, last progress note 04/23/21; current auth 04/11/21-05/21/21    Authorization - Visit Number 11    Authorization - Number of Visits 12    Progress Note Due on Visit 21    PT Start Time 1140    PT Stop Time 1230    PT Time Calculation (min) 50 min    Equipment Utilized During Treatment Gait belt   Pt uses power wheelchair, clinic's front-wheeled walker utilized for gait   Activity Tolerance Patient tolerated treatment well    Behavior During Therapy WFL for tasks assessed/performed             Past Medical History:  Diagnosis Date   Arthritis    GERD (gastroesophageal reflux disease)    Gout    Neuromuscular disorder ([place])    Pre-diabetes     Past Surgical History:  Procedure Laterality Date   ANTERIOR CERVICAL DECOMPRESSION/DISCECTOMY FUSION 4 LEVELS N/A 08/28/2019   Procedure: ANTERIOR CERVICAL DECOMPRESSION/DISCECTOMY FUSION CERVICALTHREE-FOUR CERVICAL,FOUR-FIVE,CERVICAL FIVE-SIX,CERVICAL SIX-SEVEN.;  Surgeon: [name], MD;  Location: [facility];  Service: Neurosurgery;  Laterality: N/A;  ANTERIOR   BACK SURGERY     CATARACT EXTRACTION W/PHACO Right 03/31/2018   Procedure: CATARACT EXTRACTION PHACO AND INTRAOCULAR LENS PLACEMENT ([place]);  Surgeon: [name], MD;  Location: [facility];  Service: Ophthalmology;  Laterality: Right;  [place] 00:39.7 CDE 4.35 Fluid Pack Lot # [id number]   CHOLECYSTECTOMY  1995   COLONOSCOPY WITH PROPOFOL N/A 06/07/2017   Procedure: COLONOSCOPY WITH PROPOFOL;   Surgeon: [name], MD;  Location[facility];  Service: Endoscopy;  Laterality: N/A;   ESOPHAGOGASTRODUODENOSCOPY (EGD) WITH PROPOFOL N/A 06/07/2017   Procedure: ESOPHAGOGASTRODUODENOSCOPY (EGD) WITH PROPOFOL;  Surgeon: [name], MD;  Location: [facility]COPY;  Service: Endoscopy;  Laterality: N/A;   IR CATHETER TUBE CHANGE  05/10/2020   REDUCTION MAMMAPLASTY Bilateral 1994    There were no vitals filed for this visit.   Subjective Assessment - 05/22/21 1018     Subjective Patient reports generally feeling well at arrival; her husband states that she has felt low on energy/stamina this morning. She reports feeling tightness in L lower limb that improves as she gets moving in the AM.    Patient is accompained by: Family member   husband   Pertinent History Patient is a 81 year old female s/p incomplete SCI. She had incomplete SCI following fall - C4 [place] D (DOI: 08/26/19). Primary activity limitations c difficulty walking, transferring, and completing independent functional mobility following incomplete SCI. She has undergone home health therapy for about one year. She and her husband reports she was able to transfer and ambulate with walker in home with PT walking beside her. Pt has practiced with front-wheel walker at home. 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 Patient reports using steady transfer aid for performing transfers chair to bed and W/C to standard chair. Her husband states that she is able to stand with steady device and can stand with her legs against  the bed. Pt has ramp to get in her home for her power wheelchair. Patient lives in one level home. Pt uses tub bench that she can utilize to slide into shower; her daughter/husband assists with bathing. Pt has [employer]i that can accommodate her power chair at this time. Pt has concrete driveway to get up to her ramp. Pt has small threshold to get into her home - husband utilizes cardboard to get over threshold.     Limitations Walking;Standing;House hold activities;Other (comment)   transferring, bed mobility, bladder management   Patient Stated Goals to be able to walk more independently               TREATMENT     Therapeutic Activities - patient education, repetitive task practice for improved performance of daily functional activities e.g. transferring, bed mobility, gait with various textures/surfaces   Patient independently managed components of wheelchair to initiate session: doffing safety belt, moving footrest, and doffing gait belt. Pt able to independently manage catheter drainage bag once removed from hook under power chair seat    Modified-independent stand-pivot transfer with FWW performed from chair to low mat at beginning of session and performed from low mat to chair at end of session today    Bed mobility task practice: Performed bilateral rolling on low mat; performed R and L x 5 Sidelying clamshell (attempted full straight leg raise in sidelying, but unable to perform at this time due to core/hip weakness); x20 on each side  Ambulation to sidewalk and stepping onto 3" curb near curb cut; step up/down x 5; single incident of posterior LOB requiring total-dependent level of assist to prevent fall            *not today* Lower trunk rotations with bilateral lower limbs in hooklying  with bilat shoulders at 90 deg flexion; for trunk control during rolling and re-positioning in bed; 2x15 alternating Ambulation through doorway threshold and along outside sidewalk with FWW, around perimeter of building to bench adjacent to front door of office (negotiating curb cut x 2, decline, incline, and change in surface elevation on sidewalk); x 25 minutes, with CGA during obstacle negotiation and close supervision only on even ground Lateral lift over 6-inch hurdle to simulate demands of managing lower limbs during transfer from bed and car transfer; x10 with each LE Ambulate laps in gym  with FWW; performed x 3 with min verbal cueing for foot clearance and supervision/CGA Car transfer simulation with lifting lower limbs over 6-inch hurdle along corner of table (transferring lower limbs over hurdle with 90-degree turn in sitting); performed x 1 over and back Performance of supine to/from sit with pt able to assume partial sidelying position onto L forearm  Alternating bent-knee fallout in hooklying with dowel hold bilat UE; for trunk control during rolling and bed mobility tasks; 1x15 alternating Sit to stand from raised table, 2x10 with table height, 21-inches for height of table Bridging; x25 as needed for scooting and re-positioning pelvis on bed          Neuromuscular Re-education - for increased motor recruitment of major muscle groups with motor impairment, nervous system priming and exercises to promote trunk stability     Adjacent to low mat while standing in FWW: -Standing forward tap, 2 stacked Airex pads; 2x10; alternating L and R [simulating demands of lifting legs for bed transfers and car transfer, baseline step for stepping up to curb/stair]  -Standing lateral toe tap, onto Airex; x 20 on R, x 10 on  L (significant LLE motor fatigue)     *not today* -Standing reaching and grasping with small cubes of varying size, cylinders, foam balls (in "Hand PT packet"); grasping and placing into adjacent container on raised treatment table; x 5 minutes (emphasis on LUE, minimal deficit with reaching/grasping RUE) Obstacle course; stepping over uneven ground (blue mat with ankle weights under it) and stepping over blue lines on agility ladder, consecutively; 4x each for improved targeting, foot clearance, and negotiating uneven terrain -Reaching toward sticky-notes on backside of rolling mirror, 3 targets, with no UE support; x 5 minutes [lower targets for L upper limb] Standing march with R upper extremity support only on single parallel bar, 1x10 alternating [full clearance  of RLE, unweighting of LLE with inconsistent clearance from floor] Standing toe tapping onto 6-inch step to promote active dorsiflexion during LE swing and bilateral weight shift and weight acceptance; 1x15 and 1x10 bilateral LE Supine alternating knee extension and shoulder elevation (dying bug technique) from hooklying position; 2x12 alternating  Sidelying clamshell with tactile cueing at trunk for maintaining sidelying position, to facilitate active lower limb lifting in sidelying position as needed for independent sitting to supine/supine to sit transfers; x20 on each side Sidestep in FWW along length of parallel bars; 1x D/B with CGA Hurdle step over 2-lb ankle weight on floor to simulate motor control and coordination demands for clearing threshold or minor change in surface height; 2x10 each LE Standing toe tapping onto 6-inch step to promote active dorsiflexion during LE swing and bilateral weight shift and weight acceptance; x20 bilateral LE Min[place]qu[place] at edge of table; 2x10 with therapist guarding posteriorly in case of loss of descending control Unilateral upper extremity reaching and grasping in standing position with minimal UE support, with bilateral fine motor task, pulp to pulp grasp for metal nuts, moving to adjacent container requiring cross-body reaching; x 2 min Standing heel-to-toe step over blue line; alternating LE; 2x10 alternating            ASSESSMENT Patient demonstrates safe step up onto 3-inch curb with use of FWW. Patient has single incident of LOB during step-down requiring dependent level of assist to prevent fall posteriorly - no fall today and pt is able to retain upright position and complete step-down from 3-inch curb with FWW. She demonstrates safe negotiation of incline, decline, and small curb cuts on sidewalk. Patient has ongoing difficulty with bed mobility and managing lower limbs in lying and during sidelying to sit transfers (as well as sitting to  sidelying). Independence with bed mobility has improved with compensatory use of external support (husband holding stick/rod) with RUE pulling. Patient will benefit from continued skilled therapeutic intervention per established POC for improved function and QoL.      PT Short Term Goals - 04/23/21 1254       PT SHORT TERM GOAL #1   Title Patient will perform stand-pivot transfer from chair to bed with supervision level of assist and ModI setup of power chair as needed for transferring at home    Baseline 11/26/20: Max assist for setup and mod assist to perform sit to stand, unsteady pivot prior to performing stand to sit.    12/23/20: Patient is able to setup power chair for transfer ModI. Mod to Max assist for sit to stand. CGA level of assist for standing pivot and stand to sit on low mat.   01/15/21: Setup of power chair without assist; modified independent pivot and stand-to-sit; [place] for initial set to stand prior to  pivot.  02/10/21: ModI setup of power chair, ModA for initiation of sit to stand, modified-independent stand pivot transfer.     03/17/21: Performed with CGA during sit to stand only and supervision level of assist for remainder of stand-pivot transfer (performing from power chair to table).   04/23/21: Pt performed stand-pivot transfer from power chair to low mat with CGA only needed during sit to stand to guard posteriorly.    Time 4    Period Weeks    Status Partially Met    Target Date 04/07/21      PT SHORT TERM GOAL #2   Title Patient will perform standing toe tap to 3-inch surface (Airex pad) with bilateral upper extremity support indicative of improved ability to clear feet from floor during stepping and improved weight shift to each LE with AD use    Baseline 11/26/20: Limited toe clearance during bilateral LE swing phase with front-wheeled walker.   12/23/20: performed at previous follow-up with bilateral UE support.    Time 3    Period Weeks    Status Achieved    Target  Date 12/17/20               PT Long Term Goals - 04/23/21 1255       PT LONG TERM GOAL #1   Title Patient will demonstrate improved function as evidenced by a score of 35 on FOTO measure for full participation in activities at home and in the community.    Baseline 11/26/20: FOTO 12.   12/23/20: FOTO 24.    01/15/21: FOTO 23.   02/10/21: FOTO 38.   03/17/21: 49.   04/23/21: 30.    Time 16    Period Weeks    Status Partially Met   previously had score > 35   Target Date 06/04/21      PT LONG TERM GOAL #2   Title Patient will ambulate for 150 feet without LOB, with sufficient toe clearance to prevent loss of gait stability, and c proper AD placement with least-restrictive assistive device with supervision level of assist    Baseline 11/26/20: Gait x 40 feet with CGA.   12/23/20: CGA with dec toe clearance L>RLE.   01/15/21: Ambulated 160 feet without LOB, proper AD placement; good toe clearance with first 80 feet, but decrased L>R toe clearanc with fatigue.   02/10/21: Performed with FWW with good toe clearance with first 80 feet, decreasing L toe clearance with succesive steps.   03/17/21: Performed without LOB, no significant instability with bilateral UE support on front-wheel walker; decreased toe clearance during gait L>R.  04/23/21: Performed with no LOB, proper placement of FWW, and sufficient toe clearance to negotiate doorway threshold, sidewalk, and change in surface elevation.    Time 12    Period Weeks    Status Achieved    Target Date 04/10/21      PT LONG TERM GOAL #3   Title Patient will perform independent re-positioning in chair with use of closed-chain triceps on armrests as needed for independent wheelchair mobility with no verbal cueing or tactile cueing required    Baseline 11/26/20: Dependent re-positioning in wheelchair.   12/23/20: Performed with feet on fold-out platform of her power wheelchair IND.    Time 8    Period Weeks    Status Achieved    Target Date 12/23/20       PT LONG TERM GOAL #4   Title Patient will perform simulated step-over task for clearing doorway threshold  with use of front-wheeled walker as needed for entering/exiting home with supervision level of assistance and no LOB    Baseline 11/26/20: Pt currently uses power wheelchair to negotiate threshold into home.   12/23/20: Deferred.  01/15/21: Deferred.   02/10/21: Performed last week with step-over single line on floor with negligible height.    03/17/21: Performed over doorway threshold in clinic today with intermittent CGA and no LOB.  04/23/21: Performed today with supervision level of assist.    Time 16    Period Weeks    Status Achieved    Target Date 04/10/21                   Plan - 05/22/21 1236     Clinical Impression Statement Patient demonstrates safe step up onto 3-inch curb with use of FWW. Patient has single incident of LOB during step-down requiring dependent level of assist to prevent fall posteriorly - no fall today and pt is able to retain upright position and complete step-down from 3-inch curb with FWW. She demonstrates safe negotiation of incline, decline, and small curb cuts on sidewalk. Patient has ongoing difficulty with bed mobility and managing lower limbs in lying and during sidelying to sit transfers (as well as sitting to sidelying). Independence with bed mobility has improved with compensatory use of external support (husband holding stick/rod) with RUE pulling. Patient will benefit from continued skilled therapeutic intervention per established POC for improved function and QoL.    Personal Factors and Comorbidities Age;Comorbidity 3+;Time since onset of injury/illness/exacerbation    Comorbidities GERD, pre-diabetes, obesity, gout, s/p C3-7 ACDF    Examination-Activity Limitations Bathing;Continence;Reach Overhead;Stairs;Bed Mobility;Dressing;Self Feeding;Stand;Hygiene/Grooming;Toileting;Transfers;Locomotion Level    Examination-Participation Restrictions  Community Activity;Yard Work;Laundry;Cleaning;Shop    Stability/Clinical Decision Making Evolving/Moderate complexity    Rehab Potential Fair    PT Frequency Other (comment)   2-3x/week   PT Duration Other (comment)   16 weeks   PT Treatment/Interventions Electrical Stimulation;Gait training;Stair training;Functional mobility training;Therapeutic activities;Therapeutic exercise;Balance training;Neuromuscular re-education;Wheelchair mobility training;Patient/family education    PT Next Visit Plan Gait re-training with FWW and facilitating improved independence with AD, task practice for transferring, LE strengthening, UE AROM and strengthening to assist with functional mobility and transfers.    PT Home Exercise Plan Pt continuing with home health exercises. Pt continuing with task practice for rolling, bridging, supine to sit transfers in bed; walking with supervision/decreasing level fo assist with FWW; reaching and grasping with LUE. 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, initial encounter [facility])     Problem List Patient Active Problem List   Diagnosis Date Noted   Generalized anxiety disorder 01/24/2021   Suspected deep tissue injury 10/25/2020   Wheelchair dependence 11/27/2019   Spasticity 11/27/2019   Hyperkalemia    Steroid-induced hyperglycemia    Prediabetes    Neurogenic bowel    Neurogenic bladder    Neuropathic pain    Quadriplegia ([place]) 08/31/2019   S/P cervical spinal fusion  08/28/2019   Central 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 [place], [place], [address] Phone: [phone]   Fax:  [phone]  Name: DALEIZA BACCHI MRN: [id number] Date of Birth: [birth year]

## 2021-05-22 ENCOUNTER — Encounter: Payer: Self-pay | Admitting: Physical Therapy

## 2021-05-26 ENCOUNTER — Other Ambulatory Visit: Payer: Self-pay

## 2021-05-26 ENCOUNTER — Ambulatory Visit: Payer: Medicare PPO

## 2021-05-26 DIAGNOSIS — R262 Difficulty in walking, not elsewhere classified: Secondary | ICD-10-CM

## 2021-05-26 DIAGNOSIS — M6281 Muscle weakness (generalized): Secondary | ICD-10-CM

## 2021-05-26 DIAGNOSIS — S14123A 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, initial encounter: Secondary | ICD-10-CM

## 2021-05-26 NOTE — Therapy (Addendum)
[facility] [address] [place], [place], [address] Phone: [phone]   Fax:  [phone]  Physical Therapy Treatment  Patient Details  Name: DHANVI BOESEN MRN: [id number] Date of Birth: 1940/12/28 Referring Provider (PT): [name], MD   Encounter Date: 05/26/2021   PT End of Session - 05/26/21 1138     Visit Number 25    Number of Visits 57    Date for PT Re-Evaluation 06/12/21    Authorization Time Period Cert 1/82/99-3/71/69, last progress note 04/23/21; current auth 16-Visits 05/26/21 - 07/16/21    Authorization - Visit Number 1    Authorization - Number of Visits 12    Progress Note Due on Visit 41    PT Start Time 1140    PT Stop Time 1225    PT Time Calculation (min) 45 min    Equipment Utilized During Treatment Gait belt   Pt uses power wheelchair, clinic's front-wheeled walker utilized for gait   Activity Tolerance Patient tolerated treatment well    Behavior During Therapy WFL for tasks assessed/performed             Past Medical History:  Diagnosis Date   Arthritis    GERD (gastroesophageal reflux disease)    Gout    Neuromuscular disorder ([place])    Pre-diabetes     Past Surgical History:  Procedure Laterality Date   ANTERIOR CERVICAL DECOMPRESSION/DISCECTOMY FUSION 4 LEVELS N/A 08/28/2019   Procedure: ANTERIOR CERVICAL DECOMPRESSION/DISCECTOMY FUSION CERVICALTHREE-FOUR CERVICAL,FOUR-FIVE,CERVICAL FIVE-SIX,CERVICAL SIX-SEVEN.;  Surgeon: [name], MD;  Location: [facility];  Service: Neurosurgery;  Laterality: N/A;  ANTERIOR   BACK SURGERY     CATARACT EXTRACTION W/PHACO Right 03/31/2018   Procedure: CATARACT EXTRACTION PHACO AND INTRAOCULAR LENS PLACEMENT ([place]);  Surgeon: [name], MD;  Location: [facility];  Service: Ophthalmology;  Laterality: Right;  [place] 00:39.7 CDE 4.35 Fluid Pack Lot # [id number]   CHOLECYSTECTOMY  1995   COLONOSCOPY WITH PROPOFOL N/A 06/07/2017   Procedure: COLONOSCOPY WITH  PROPOFOL;  Surgeon: [name], MD;  Location: [facility] END[facility];  Service: Endoscopy;  Laterality: N/A;   ESOPHAGOGASTRODUODENOSCOPY (EGD) WITH PROPOFOL N/A 06/07/2017   Procedure: ESOPHAGOGASTRODUODENOSCOPY (EGD) WITH PROPOFOL;  Surgeon: [name], MD;  Location: [facility] ENDOSCOPY;  Service: Endoscopy;  Laterality: N/A;   IR CATHETER TUBE CHANGE  05/10/2020   REDUCTION MAMMAPLASTY Bilateral 1994    There were no vitals filed for this visit.   Subjective Assessment - 05/26/21 1137     Subjective Pt reports feeling good upon arrival. Pt states that the tightness in the LLE is especially prevalent today. Pt reports going for a walk on Saturday with her daughter and reports no issues.    Patient is accompained by: Family member   husband   Pertinent History Patient is a 81 year old female s/p incomplete SCI. She had incomplete SCI following fall - C4 [place] D (DOI: 08/26/19). Primary activity limitations c difficulty walking, transferring, and completing independent functional mobility following incomplete SCI. She has undergone home health therapy for about one year. She and her husband reports she was able to transfer and ambulate with walker in home with PT walking beside her. Pt has practiced with front-wheel walker at home. 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 Patient reports using steady transfer aid for performing transfers chair to bed and W/C to standard chair. Her husband states that she is able to stand with steady device and can stand with her legs against  the bed. Pt has ramp to get in her home for her power wheelchair. Patient lives in one level home. Pt uses tub bench that she can utilize to slide into shower; her daughter/husband assists with bathing. Pt has Lucianne Lei that can accommodate her power chair at this time. Pt has concrete driveway to get up to her ramp. Pt has small threshold to get into her home - husband utilizes cardboard to get over threshold.    Limitations  Walking;Standing;House hold activities;Other (comment)   transferring, bed mobility, bladder management   Patient Stated Goals to be able to walk more independently               TREATMENT     Therapeutic Activities - patient education, repetitive task practice for improved performance of daily functional activities e.g. transferring, bed mobility, gait with various textures/surfaces   Patient independently managed components of wheelchair to initiate session: doffing safety belt, moving footrest, and doffing gait belt. Pt able to independently manage catheter drainage bag once removed from hook under power chair seat    Modified-independent stand-pivot transfer with FWW performed from chair to low mat at beginning of session and performed from low mat to chair at end of session today   Bed mobility task practice: Performed bilateral rolling on low mat, performed R and L x 5, cues provided by therapist for contralateral (opposite side of role) heel slide followed by LE adduction and shoulder adduction/protraction. Pt requires min to modA +1 for rolling;   Ther-ex  Heels slides with resisted extension x 10 BLE Hooklying clams with manual resistance from therapist x 10; Hooklying adductor squeeze with manual resistance from therapist x 10; SLR w/assistance from therapist x 10 BLE; Hooklying bridge with therapist blocking feet x 10; Resisted R shoulder protraction working on initiating roll x 5 Hooklying anterior tilt x 5; compensated with glutes and pt struggled to perform anterior tilt; R side-lying lateral oblique contractions x 5 as precursor for sidelying to sitting transfer; Sit to stand w/o UE support from elevated mat table 2 x 5; Ambulation on sidewalk ~177' with patient navigating turns, incline, and decline. Therapist providing CGA with occasional stepping cues;       Focused session on light strengthening in supine and exercises that will aid in progressing bed mobility  abilities. Pt expressed more difficulty rolling to the right than the left and required moderate assist. Pt able to perform sit to stands from elevated table position with no UE support or assist needed. During ambulation outside pt demonstrated safe navigation of decline, incline and turns on the sidewalk. Due to fatigue walk ended early and seated break was needed, but no LOB occurred. Pt would benefit from continued PT services to further improved function and QoL.       PT Short Term Goals - 04/23/21 1254       PT SHORT TERM GOAL #1   Title Patient will perform stand-pivot transfer from chair to bed with supervision level of assist and ModI setup of power chair as needed for transferring at home    Baseline 11/26/20: Max assist for setup and mod assist to perform sit to stand, unsteady pivot prior to performing stand to sit.    12/23/20: Patient is able to setup power chair for transfer ModI. Mod to Max assist for sit to stand. CGA level of assist for standing pivot and stand to sit on low mat.   01/15/21: Setup of power chair without assist; modified independent pivot and  stand-to-sit; ModA for initial set to stand prior to pivot.  02/10/21: ModI setup of power chair, ModA for initiation of sit to stand, modified-independent stand pivot transfer.     03/17/21: Performed with CGA during sit to stand only and supervision level of assist for remainder of stand-pivot transfer (performing from power chair to table).   04/23/21: Pt performed stand-pivot transfer from power chair to low mat with CGA only needed during sit to stand to guard posteriorly.    Time 4    Period Weeks    Status Partially Met    Target Date 04/07/21      PT SHORT TERM GOAL #2   Title Patient will perform standing toe tap to 3-inch surface (Airex pad) with bilateral upper extremity support indicative of improved ability to clear feet from floor during stepping and improved weight shift to each LE with AD use    Baseline 11/26/20:  Limited toe clearance during bilateral LE swing phase with front-wheeled walker.   12/23/20: performed at previous follow-up with bilateral UE support.    Time 3    Period Weeks    Status Achieved    Target Date 12/17/20               PT Long Term Goals - 04/23/21 1255       PT LONG TERM GOAL #1   Title Patient will demonstrate improved function as evidenced by a score of 35 on FOTO measure for full participation in activities at home and in the community.    Baseline 11/26/20: FOTO 12.   12/23/20: FOTO 24.    01/15/21: FOTO 23.   02/10/21: FOTO 38.   03/17/21: 49.   04/23/21: 30.    Time 16    Period Weeks    Status Partially Met   previously had score > 35   Target Date 06/04/21      PT LONG TERM GOAL #2   Title Patient will ambulate for 150 feet without LOB, with sufficient toe clearance to prevent loss of gait stability, and c proper AD placement with least-restrictive assistive device with supervision level of assist    Baseline 11/26/20: Gait x 40 feet with CGA.   12/23/20: CGA with dec toe clearance L>RLE.   01/15/21: Ambulated 160 feet without LOB, proper AD placement; good toe clearance with first 80 feet, but decrased L>R toe clearanc with fatigue.   02/10/21: Performed with FWW with good toe clearance with first 80 feet, decreasing L toe clearance with succesive steps.   03/17/21: Performed without LOB, no significant instability with bilateral UE support on front-wheel walker; decreased toe clearance during gait L>R.  04/23/21: Performed with no LOB, proper placement of FWW, and sufficient toe clearance to negotiate doorway threshold, sidewalk, and change in surface elevation.    Time 12    Period Weeks    Status Achieved    Target Date 04/10/21      PT LONG TERM GOAL #3   Title Patient will perform independent re-positioning in chair with use of closed-chain triceps on armrests as needed for independent wheelchair mobility with no verbal cueing or tactile cueing required     Baseline 11/26/20: Dependent re-positioning in wheelchair.   12/23/20: Performed with feet on fold-out platform of her power wheelchair IND.    Time 8    Period Weeks    Status Achieved    Target Date 12/23/20      PT LONG TERM GOAL #4   Title Patient  will perform simulated step-over task for clearing doorway threshold with use of front-wheeled walker as needed for entering/exiting home with supervision level of assistance and no LOB    Baseline 11/26/20: Pt currently uses power wheelchair to negotiate threshold into home.   12/23/20: Deferred.  01/15/21: Deferred.   02/10/21: Performed last week with step-over single line on floor with negligible height.    03/17/21: Performed over doorway threshold in clinic today with intermittent CGA and no LOB.  04/23/21: Performed today with supervision level of assist.    Time 16    Period Weeks    Status Achieved    Target Date 04/10/21                   Plan - 05/26/21 1242     Clinical Impression Statement Focused session on light strengthening in supine and exercises that will aid in progressing bed mobility abilities. Pt expressed more difficulty rolling to the right than the left and required moderate assist. Pt able to perform sit to stands from elevated table position with no UE support or assist needed. During ambulation outside pt demonstrated safe navigation of decline, incline and turns on the sidewalk. Due to fatigue walk ended early and seated break was needed, but no LOB occurred. Pt will need updated outcome measures and a progress note at next visit. Pt would benefit from continued PT services to further improved function and QoL.    Personal Factors and Comorbidities Age;Comorbidity 3+;Time since onset of injury/illness/exacerbation    Comorbidities GERD, pre-diabetes, obesity, gout, s/p C3-7 ACDF    Examination-Activity Limitations Bathing;Continence;Reach Overhead;Stairs;Bed Mobility;Dressing;Self  Feeding;Stand;Hygiene/Grooming;Toileting;Transfers;Locomotion Level    Examination-Participation Restrictions Community Activity;Yard Work;Laundry;Cleaning;Shop    Stability/Clinical Decision Making Evolving/Moderate complexity    Rehab Potential Fair    PT Frequency Other (comment)   2-3x/week   PT Duration Other (comment)   16 weeks   PT Treatment/Interventions Electrical Stimulation;Gait training;Stair training;Functional mobility training;Therapeutic exercise;Balance training;Neuromuscular re-education;Wheelchair mobility training;Patient/family education;Therapeutic activities    PT Next Visit Plan Update outcome meaures and goals, progress note, Gait re-training with FWW and facilitating improved independence with AD, task practice for transferring, LE strengthening, UE AROM and strengthening to assist with functional mobility and transfers.    PT Home Exercise Plan Pt continuing with home health exercises. Pt continuing with task practice for rolling, bridging, supine to sit transfers in bed; walking with supervision/decreasing level fo assist with FWW; reaching and grasping with LUE. 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, initial encounter [facility])     Problem List Patient Active Problem List   Diagnosis Date Noted   Generalized anxiety disorder 01/24/2021   Suspected deep tissue injury 10/25/2020   Wheelchair dependence 11/27/2019   Spasticity 11/27/2019   Hyperkalemia     Steroid-induced hyperglycemia    Prediabetes    Neurogenic bowel    Neurogenic bladder    Neuropathic pain    Quadriplegia ([place]) 08/31/2019   S/P cervical spinal fusion 08/28/2019   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 [address] [place], [place], [address] Phone: [phone]   Fax:  [phone]  Name: APPLE DEARMAS MRN: [id number] Date of Birth: 04-21-1940

## 2021-05-26 NOTE — Patient Instructions (Signed)
TREATMENT     Therapeutic Activities - patient education, repetitive task practice for improved performance of daily functional activities e.g. transferring, bed mobility, gait with various textures/surfaces   Patient independently managed components of wheelchair to initiate session: doffing safety belt, moving footrest, and doffing gait belt. Pt able to independently manage catheter drainage bag once removed from hook under power chair seat    Modified-independent stand-pivot transfer with FWW performed from chair to low mat at beginning of session and performed from low mat to chair at end of session today      Bed mobility task practice: Performed bilateral rolling on low mat; performed R and L x 5 Sidelying clamshell (attempted full straight leg raise in sidelying, but unable to perform at this time due to core/hip weakness); x20 on each side   Ambulation to sidewalk and stepping onto 3" curb near curb cut; step up/down x 5; single incident of posterior LOB requiring total-dependent level of assist to prevent fall              *not today* Lower trunk rotations with bilateral lower limbs in hooklying  with bilat shoulders at 90 deg flexion; for trunk control during rolling and re-positioning in bed; 2x15 alternating Ambulation through doorway threshold and along outside sidewalk with FWW, around perimeter of building to bench adjacent to front door of office (negotiating curb cut x 2, decline, incline, and change in surface elevation on sidewalk); x 25 minutes, with CGA during obstacle negotiation and close supervision only on even ground Lateral lift over 6-inch hurdle to simulate demands of managing lower limbs during transfer from bed and car transfer; x10 with each LE Ambulate laps in gym with FWW; performed x 3 with min verbal cueing for foot clearance and supervision/CGA Car transfer simulation with lifting lower limbs over 6-inch hurdle along corner of table (transferring lower  limbs over hurdle with 90-degree turn in sitting); performed x 1 over and back Performance of supine to/from sit with pt able to assume partial sidelying position onto L forearm  Alternating bent-knee fallout in hooklying with dowel hold bilat UE; for trunk control during rolling and bed mobility tasks; 1x15 alternating Sit to stand from raised table, 2x10 with table height, 21-inches for height of table Bridging; x25 as needed for scooting and re-positioning pelvis on bed          Neuromuscular Re-education - for increased motor recruitment of major muscle groups with motor impairment, nervous system priming and exercises to promote trunk stability     Adjacent to low mat while standing in FWW: -Standing forward tap, 2 stacked Airex pads; 2x10; alternating L and R [simulating demands of lifting legs for bed transfers and car transfer, baseline step for stepping up to curb/stair]   -Standing lateral toe tap, onto Airex; x 20 on R, x 10 on L (significant LLE motor fatigue)      *not today* -Standing reaching and grasping with small cubes of varying size, cylinders, foam balls (in "Hand PT packet"); grasping and placing into adjacent container on raised treatment table; x 5 minutes (emphasis on LUE, minimal deficit with reaching/grasping RUE) Obstacle course; stepping over uneven ground (blue mat with ankle weights under it) and stepping over blue lines on agility ladder, consecutively; 4x each for improved targeting, foot clearance, and negotiating uneven terrain -Reaching toward sticky-notes on backside of rolling mirror, 3 targets, with no UE support; x 5 minutes [lower targets for L upper limb] Standing march with R upper extremity  support only on single parallel bar, 1x10 alternating [full clearance of RLE, unweighting of LLE with inconsistent clearance from floor] Standing toe tapping onto 6-inch step to promote active dorsiflexion during LE swing and bilateral weight shift and weight  acceptance; 1x15 and 1x10 bilateral LE Supine alternating knee extension and shoulder elevation (dying bug technique) from hooklying position; 2x12 alternating  Sidelying clamshell with tactile cueing at trunk for maintaining sidelying position, to facilitate active lower limb lifting in sidelying position as needed for independent sitting to supine/supine to sit transfers; x20 on each side Sidestep in FWW along length of parallel bars; 1x D/B with CGA Hurdle step over 2-lb ankle weight on floor to simulate motor control and coordination demands for clearing threshold or minor change in surface height; 2x10 each LE Standing toe tapping onto 6-inch step to promote active dorsiflexion during LE swing and bilateral weight shift and weight acceptance; x20 bilateral LE Minisquat at edge of table; 2x10 with therapist guarding posteriorly in case of loss of descending control Unilateral upper extremity reaching and grasping in standing position with minimal UE support, with bilateral fine motor task, pulp to pulp grasp for metal nuts, moving to adjacent container requiring cross-body reaching; x 2 min Standing heel-to-toe step over blue line; alternating LE; 2x10 alternating            ASSESSMENT Patient demonstrates safe step up onto 3-inch curb with use of FWW. Patient has single incident of LOB during step-down requiring dependent level of assist to prevent fall posteriorly - no fall today and pt is able to retain upright position and complete step-down from 3-inch curb with FWW. She demonstrates safe negotiation of incline, decline, and small curb cuts on sidewalk. Patient has ongoing difficulty with bed mobility and managing lower limbs in lying and during sidelying to sit transfers (as well as sitting to sidelying). Independence with bed mobility has improved with compensatory use of external support (husband holding stick/rod) with RUE pulling. Patient will benefit from continued skilled therapeutic  intervention per established POC for improved function and QoL.

## 2021-05-27 IMAGING — CT CT IMAGE GUIDED DRAINAGE BY PERCUTANEOUS CATHETER
1 of 3 series · 11 of 32 positions shown, 17 images · non-contrast
Comparison: None.

INDICATION: 79-year-old female with history of cervical spinal injury resulting
in neurogenic bladder.

EXAM:
CT IMAGE GUIDED DRAINAGE BY PERCUTANEOUS CATHETER

[Series 3: i-spiral 5.0 b30f · axial · 0.83mm/px · z∈[+728,+876]mm · 11 of 52 slices shown, 17 images]
[im 5/52  soft-tissue]
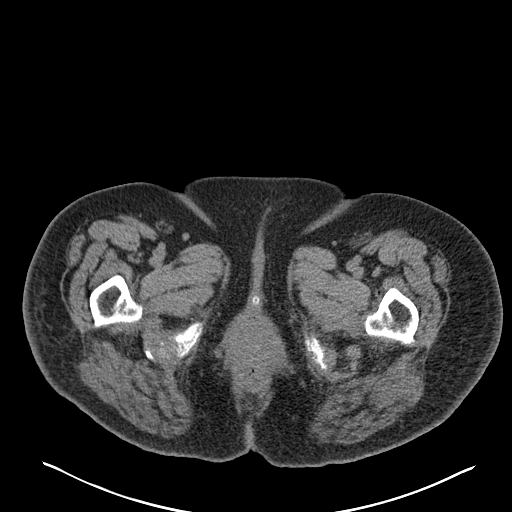
[im 5/52  bone]
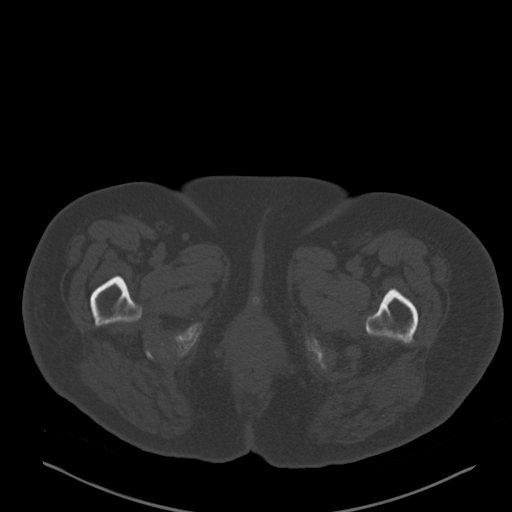
[im 9/52  soft-tissue]
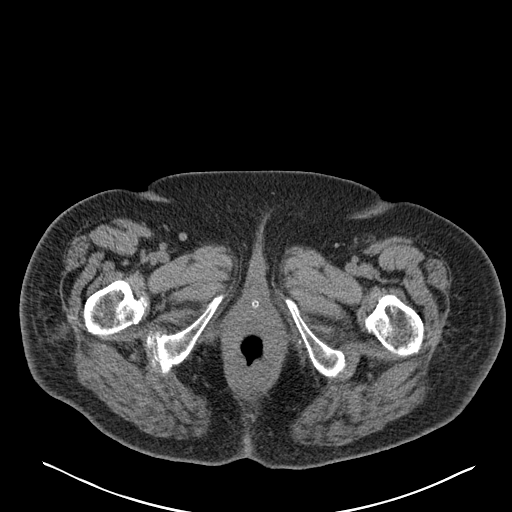
[im 13/52  soft-tissue]
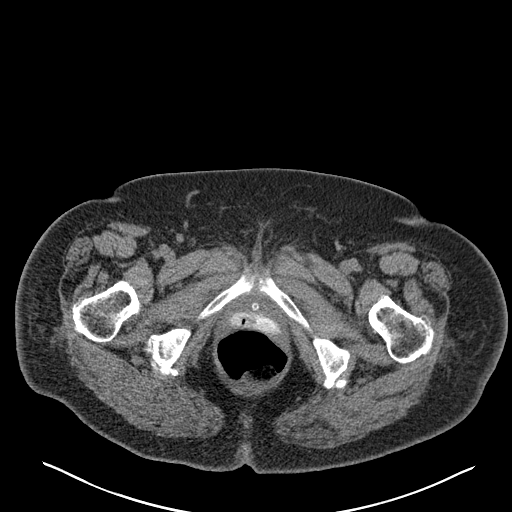
[im 18/52  soft-tissue]
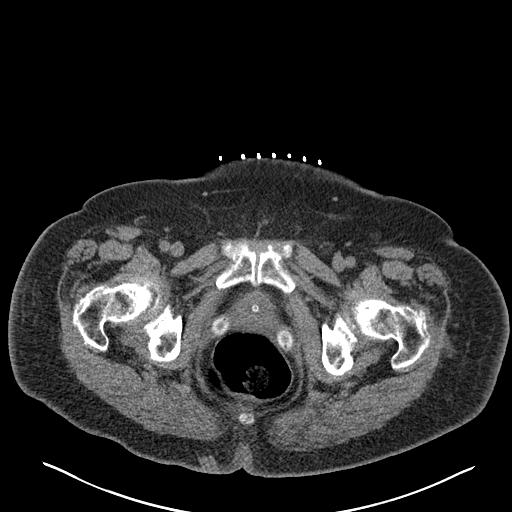
[im 22/52  soft-tissue]
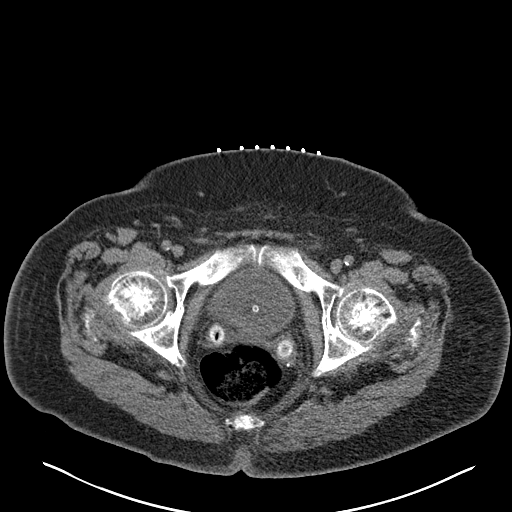
[im 26/52  soft-tissue]
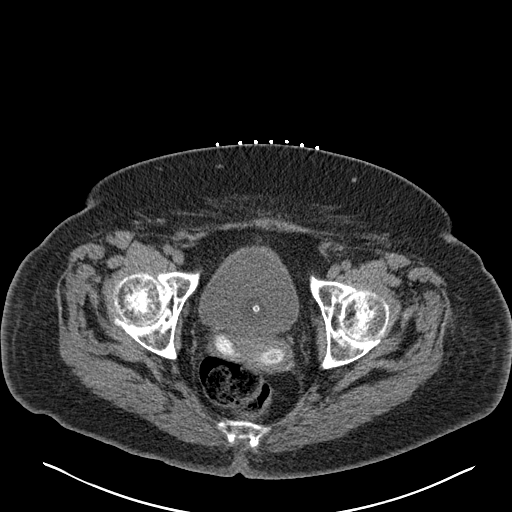
[im 30/52  soft-tissue]
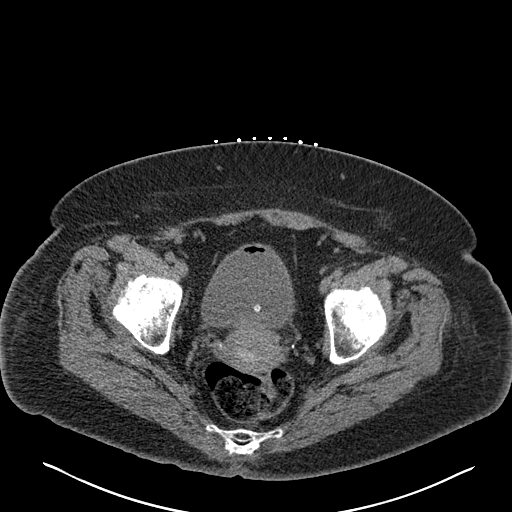
[im 35/52  soft-tissue]
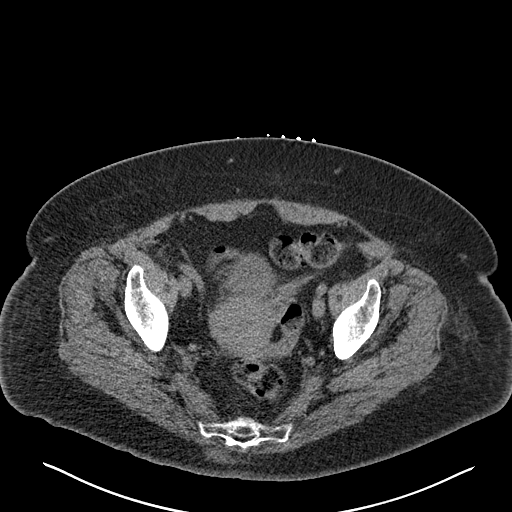
[im 35/52  lung]
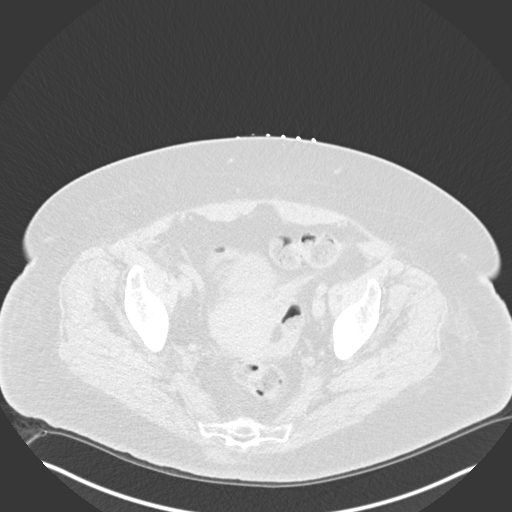
[im 39/52  soft-tissue]
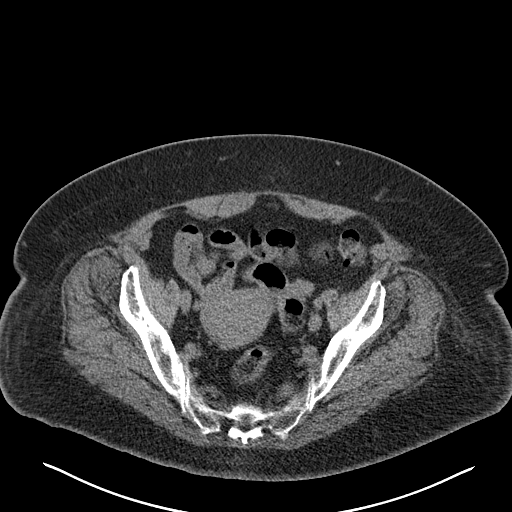
[im 39/52  lung]
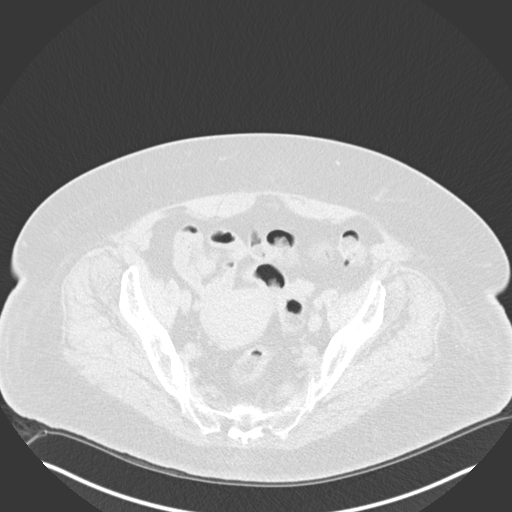
[im 39/52  bone]
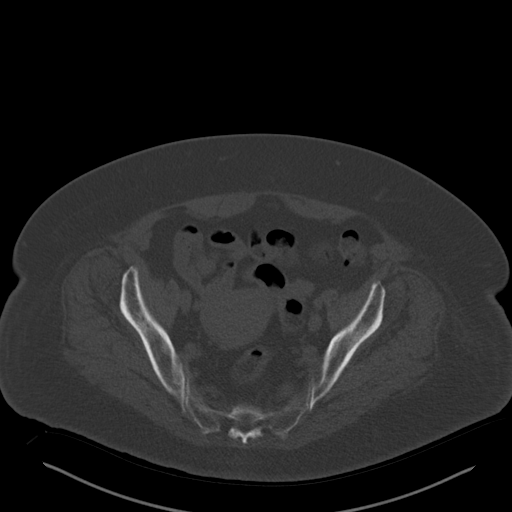
[im 43/52  soft-tissue]
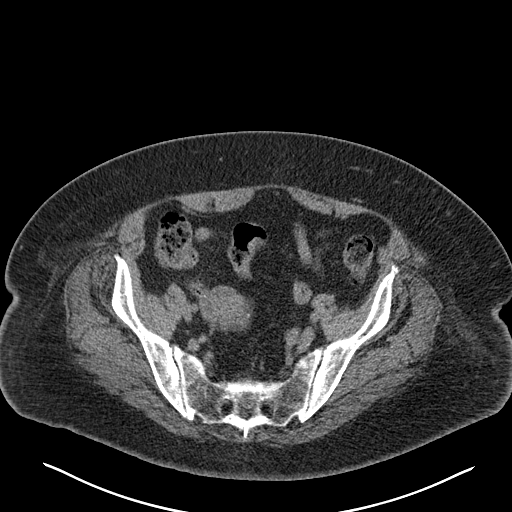
[im 43/52  lung]
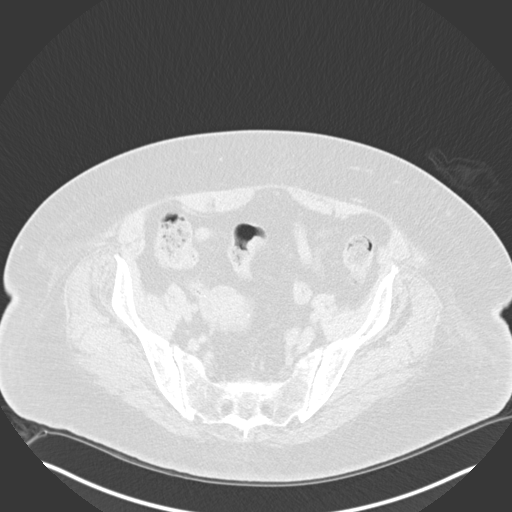
[im 47/52  soft-tissue]
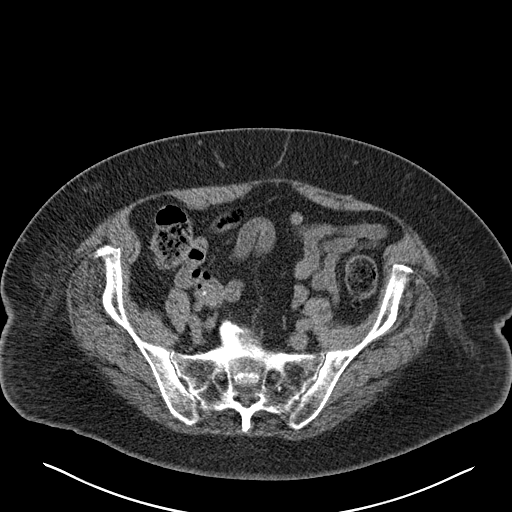
[im 47/52  lung]
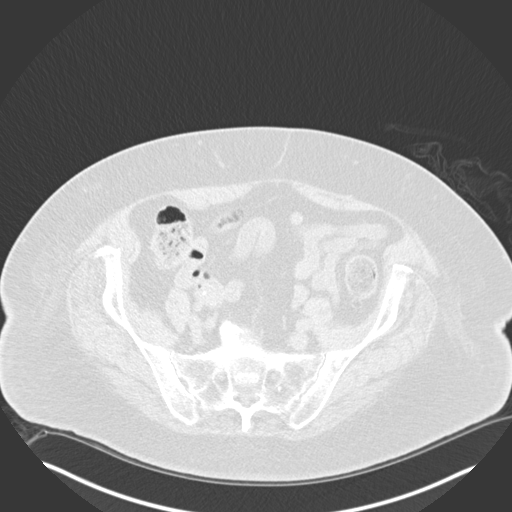

[11 of 32 positions shown; findings below may reference images not displayed]

MEDICATIONS:
The patient is currently admitted to the hospital and receiving
intravenous antibiotics. The antibiotics were administered within an
appropriate time frame prior to the initiation of the procedure.

ANESTHESIA/SEDATION:
Moderate (conscious) sedation was employed during this procedure. A
total of Versed 1 mg and Fentanyl 50 mcg was administered
intravenously.

Moderate Sedation Time: 14 minutes. The patient's level of
consciousness and vital signs were monitored continuously by
radiology nursing throughout the procedure under my direct
supervision.

CONTRAST:  None

COMPLICATIONS:
None immediate.

PROCEDURE:
Informed written consent was obtained from the patient after a
discussion of the risks, benefits and alternatives to treatment. The
patient was placed supine on the CT gantry and a pre procedural CT
was performed which demonstrated a partially distended bladder with
indwelling trans urethral Foley catheter. The procedure was planned.
A timeout was performed prior to the initiation of the procedure.

The suprapubic region was prepped and draped in the usual sterile
fashion. The overlying soft tissues were anesthetized with 1%
lidocaine with epinephrine. Appropriate trajectory was planned with
the use of a 22 gauge spinal needle. An 18 gauge trocar needle was
advanced into the urinary bladder and a short Amplatz super stiff
wire was coiled within the bladder. Appropriate positioning was
confirmed with a limited CT scan. The tract was serially dilated
allowing placement of a 12 French all-purpose pigtail drainage
catheter. Appropriate positioning was confirmed with a limited
postprocedural CT scan.

There was clear urine returned through the newly placed tube. The
tube was connected to a drainage bag and sutured in place. A
dressing was placed. The patient tolerated the procedure well
without immediate post procedural complication.
IMPRESSION: Successful CT guided placement of a 12 French pigtail suprapubic
catheter.

PLAN:
Return in 4-6 weeks 4 suprapubic drain upsize to 16 French.

## 2021-05-28 ENCOUNTER — Ambulatory Visit: Payer: Medicare PPO

## 2021-05-28 ENCOUNTER — Other Ambulatory Visit: Payer: Self-pay

## 2021-05-28 DIAGNOSIS — R262 Difficulty in walking, not elsewhere classified: Secondary | ICD-10-CM | POA: Diagnosis not present

## 2021-05-28 DIAGNOSIS — M6281 Muscle weakness (generalized): Secondary | ICD-10-CM

## 2021-05-28 DIAGNOSIS — S14123A 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, initial encounter: Secondary | ICD-10-CM

## 2021-05-28 NOTE — Therapy (Signed)
[facility] [address]. [place], [place], [address] Phone: [phone]   Fax:  [phone]  Physical Therapy Treatment Physical Therapy Progress Note   Dates of reporting period  04/23/21   to   05/28/21  Patient Details  Name: Meredith Roach MRN: [id number] Date of Birth: 07-Jan-1941 Referring Provider (PT): [name], MD   Encounter Date: 05/28/2021   PT End of Session - 05/28/21 1306     Visit Number 60    Number of Visits 92    Date for PT Re-Evaluation 06/12/21    Authorization Time Period Cert 6/96/29-09/08/39, last progress note 04/23/21; current auth 16-Visits 05/26/21 - 07/16/21, progress note on 05/28/21    Authorization - Visit Number 2    Authorization - Number of Visits 12    Progress Note Due on Visit 23    PT Start Time 1145    PT Stop Time 1230    PT Time Calculation (min) 45 min    Equipment Utilized During Treatment Gait belt   Pt uses power wheelchair, clinic's front-wheeled walker utilized for gait   Activity Tolerance Patient tolerated treatment well    Behavior During Therapy WFL for tasks assessed/performed             Past Medical History:  Diagnosis Date   Arthritis    GERD (gastroesophageal reflux disease)    Gout    Neuromuscular disorder ([place])    Pre-diabetes     Past Surgical History:  Procedure Laterality Date   ANTERIOR CERVICAL DECOMPRESSION/DISCECTOMY FUSION 4 LEVELS N/A 08/28/2019   Procedure: ANTERIOR CERVICAL DECOMPRESSION/DISCECTOMY FUSION CERVICALTHREE-FOUR CERVICAL,FOUR-FIVE,CERVICAL FIVE-SIX,CERVICAL SIX-SEVEN.;  Surgeon: [name], MD;  Location: [facility];  Service: Neurosurgery;  Laterality: N/A;  ANTERIOR   BACK SURGERY     CATARACT EXTRACTION W/PHACO Right 03/31/2018   Procedure: CATARACT EXTRACTION PHACO AND INTRAOCULAR LENS PLACEMENT ([place]);  Surgeon: [name], MD;  Location: [facility];  Service: Ophthalmology;  Laterality: Right;  [place] 00:39.7 CDE 4.35 Fluid Pack Lot  # [id number]   CHOLECYSTECTOMY  1995   COLONOSCOPY WITH PROPOFOL N/A 06/07/2017   Procedure: COLONOSCOPY WITH PROPOFOL;  Surgeon: [name], MD;  Location: [facility] ENDOSCOPY;  Service: Endoscopy;  Laterality: N/A;   ESOPHAGOGASTRODUODENOSCOPY (EGD) WITH PROPOFOL N/A 06/07/2017   Procedure: ESOPHAGOGASTRODUODENOSCOPY (EGD) WITH PROPOFOL;  Surgeon: [name], MD;  Location: [facility] ENDOSCOPY;  Service: Endoscopy;  Laterality: N/A;   IR CATHETER TUBE CHANGE  05/10/2020   REDUCTION MAMMAPLASTY Bilateral 1994    There were no vitals filed for this visit.   Subjective Assessment - 05/28/21 1133     Subjective Pt reports her L LE feels less stiff today than when she was her on Mon.  Her grand daughter is getting married in April and she is looking forward to that.  She continues to work on her standing, walking, and bed mobility at home.    Patient is accompained by: Family member   husband   Pertinent History Patient is a 81 year old female s/p incomplete SCI. She had incomplete SCI following fall - C4 [place] D (DOI: 08/26/19). Primary activity limitations c difficulty walking, transferring, and completing independent functional mobility following incomplete SCI. She has undergone home health therapy for about one year. She and her husband reports she was able to transfer and ambulate with walker in home with PT walking beside her. Pt has practiced with front-wheel walker at home. 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 Patient  reports using steady transfer aid for performing transfers chair to bed and W/C to standard chair. Her husband states that she is able to stand with steady device and can stand with her legs against the bed. Pt has ramp to get in her home for her power wheelchair. Patient lives in one level home. Pt uses tub bench that she can utilize to slide into shower; her daughter/husband assists with bathing. Pt has Lucianne Lei that can accommodate her power chair at this time. Pt has  concrete driveway to get up to her ramp. Pt has small threshold to get into her home - husband utilizes cardboard to get over threshold.    Limitations Walking;Standing;House hold activities;Other (comment)   transferring, bed mobility, bladder management   Patient Stated Goals to be able to walk more independently                 TREATMENT FOTO: 27 today    Therapeutic Activities - patient education, repetitive task practice for improved performance of daily functional activities e.g. transferring, bed mobility, gait with various textures/surfaces   Patient independently managed components of wheelchair to initiate session: doffing safety belt, moving footrest, and doffing gait belt. Pt able to independently manage catheter drainage bag once removed from hook under power chair seat    Modified-independent stand-pivot transfer with FWW performed from chair to low mat at beginning of session and performed from low mat to chair at end of session today    Bed mobility task practice: Performed bilateral rolling on low mat, performed R and L x 5, cues provided by therapist for contralateral (opposite side of role) heel slide followed by LE adduction and shoulder adduction/protraction. Pt requires min to modA +1 for rolling;  Sit to stand 3x5 from mat table, pt uses occasional R UE support for transfer, PT SBA with gait belt  Seated push/pull MRE with b/l UE's 2x12 using wand with PT to simulate push/pull functional task (ie: pushing door) MRE elbow flex/extension unilaterally 2x12 with PT to simulate push/pull functional task (ie: pull door)     Ther-ex  Heels slides with resisted extension x 10 BLE Seated hip abd with manual resistance from therapist x 10; 2 sets Seated adductor squeeze with manual resistance from therapist x 10; 2 sets Seated toe raises: 2x10 Standing heel raises: 2x10 Standing marches 2x12 Standing scapular retraction: 3x8 with PT tactile cues, 3 sec holds  Sit  to stand w/o UE support from elevated mat table 2 x 5;  Pt amb 2 laps in gym with with FWW and PT SBA with gait belt; verbal cues for foot clearance L>R when she became more fatigued; 1 standing rest break for 10 sec to reposition posture during amb. Pt independently transferred stand to sit in power chair at end of session, requires assistance for buckling seatbelt and footrest placement at end of session.    PT Short Term Goals - 05/28/21 1312       PT SHORT TERM GOAL #1   Title Patient will perform stand-pivot transfer from chair to bed with supervision level of assist and ModI setup of power chair as needed for transferring at home    Baseline 11/26/20: Max assist for setup and mod assist to perform sit to stand, unsteady pivot prior to performing stand to sit.    12/23/20: Patient is able to setup power chair for transfer ModI. Mod to Max assist for sit to stand. CGA level of assist for standing pivot and stand to sit  on low mat.   01/15/21: Setup of power chair without assist; modified independent pivot and stand-to-sit; ModA for initial set to stand prior to pivot.  02/10/21: ModI setup of power chair, ModA for initiation of sit to stand, modified-independent stand pivot transfer.     03/17/21: Performed with CGA during sit to stand only and supervision level of assist for remainder of stand-pivot transfer (performing from power chair to table).   04/23/21: Pt performed stand-pivot transfer from power chair to low mat with CGA only needed during sit to stand to guard posteriorly.    Time 4    Period Weeks    Status Partially Met    Target Date 04/07/21      PT SHORT TERM GOAL #2   Title Patient will perform standing toe tap to 3-inch surface (Airex pad) with bilateral upper extremity support indicative of improved ability to clear feet from floor during stepping and improved weight shift to each LE with AD use    Baseline 11/26/20: Limited toe clearance during bilateral LE swing phase with  front-wheeled walker.   12/23/20: performed at previous follow-up with bilateral UE support.    Time 3    Period Weeks    Status Achieved    Target Date 12/17/20               PT Long Term Goals - 05/28/21 1312       PT LONG TERM GOAL #1   Title Patient will demonstrate improved function as evidenced by a score of 35 on FOTO measure for full participation in activities at home and in the community.    Baseline 11/26/20: FOTO 12.   12/23/20: FOTO 24.    01/15/21: FOTO 23.   02/10/21: FOTO 38.   03/17/21: 49.   04/23/21: 30. 05/28/21: 27.    Time 16    Period Weeks    Status Partially Met   previously had score > 35   Target Date 06/25/21      PT LONG TERM GOAL #2   Title Patient will ambulate for 150 feet without LOB, with sufficient toe clearance to prevent loss of gait stability, and c proper AD placement with least-restrictive assistive device with supervision level of assist    Baseline 11/26/20: Gait x 40 feet with CGA.   12/23/20: CGA with dec toe clearance L>RLE.   01/15/21: Ambulated 160 feet without LOB, proper AD placement; good toe clearance with first 80 feet, but decrased L>R toe clearanc with fatigue.   02/10/21: Performed with FWW with good toe clearance with first 80 feet, decreasing L toe clearance with succesive steps.   03/17/21: Performed without LOB, no significant instability with bilateral UE support on front-wheel walker; decreased toe clearance during gait L>R.  04/23/21: Performed with no LOB, proper placement of FWW, and sufficient toe clearance to negotiate doorway threshold, sidewalk, and change in surface elevation.    Time 12    Period Weeks    Status Achieved    Target Date 04/10/21      PT LONG TERM GOAL #3   Title Patient will perform independent re-positioning in chair with use of closed-chain triceps on armrests as needed for independent wheelchair mobility with no verbal cueing or tactile cueing required    Baseline 11/26/20: Dependent re-positioning in  wheelchair.   12/23/20: Performed with feet on fold-out platform of her power wheelchair IND.    Time 8    Period Weeks    Status Achieved  Target Date 12/23/20      PT LONG TERM GOAL #4   Title Patient will perform simulated step-over task for clearing doorway threshold with use of front-wheeled walker as needed for entering/exiting home with supervision level of assistance and no LOB    Baseline 11/26/20: Pt currently uses power wheelchair to negotiate threshold into home.   12/23/20: Deferred.  01/15/21: Deferred.   02/10/21: Performed last week with step-over single line on floor with negligible height.    03/17/21: Performed over doorway threshold in clinic today with intermittent CGA and no LOB.  04/23/21: Performed today with supervision level of assist.    Time 16    Period Weeks    Status Achieved    Target Date 04/10/21      PT LONG TERM GOAL #5   Title Pt will be able to independently perform supine to sit transfer to promote independence with bed mobility    Baseline 05/28/21 min A    Time 6    Period Weeks    Status New    Target Date 07/08/21                   Plan - 05/28/21 1307     Clinical Impression Statement Pt able to perform sit to stand with occasional UE support on chair seat today.  She is able to ambulate on flat surfaces in clinic with FWW; L foot drag noted as she begins to fatigue.  Updated goals/outcome measures during session today too.  She should continue to benefit from skilled PT to further improve her function and to promote independence with bed mobility, transfers, ambulation, and ADLs.    Personal Factors and Comorbidities Age;Comorbidity 3+;Time since onset of injury/illness/exacerbation    Comorbidities GERD, pre-diabetes, obesity, gout, s/p C3-7 ACDF    Examination-Activity Limitations Bathing;Continence;Reach Overhead;Stairs;Bed Mobility;Dressing;Self Feeding;Stand;Hygiene/Grooming;Toileting;Transfers;Locomotion Level     Examination-Participation Restrictions Community Activity;Yard Work;Laundry;Cleaning;Shop    Stability/Clinical Decision Making Evolving/Moderate complexity    Rehab Potential Fair    PT Frequency Other (comment)   2-3x/week   PT Duration Other (comment)   16 weeks   PT Treatment/Interventions Electrical Stimulation;Gait training;Stair training;Functional mobility training;Therapeutic exercise;Balance training;Neuromuscular re-education;Wheelchair mobility training;Patient/family education;Therapeutic activities    PT Next Visit Plan Update outcome meaures and goals, progress note, Gait re-training with FWW and facilitating improved independence with AD, task practice for transferring, LE strengthening, UE AROM and strengthening to assist with functional mobility and transfers.    PT Home Exercise Plan Pt continuing with home health exercises. Pt continuing with task practice for rolling, bridging, supine to sit transfers in bed; walking with supervision/decreasing level fo assist with FWW; reaching and grasping with LUE. 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, initial encounter [facility])     Problem List Patient Active Problem List   Diagnosis Date Noted   Generalized anxiety disorder 01/24/2021   Suspected deep tissue injury 10/25/2020   Wheelchair dependence 11/27/2019   Spasticity 11/27/2019   Hyperkalemia    Steroid-induced hyperglycemia  Prediabetes    Neurogenic bowel    Neurogenic  bladder    Neuropathic pain    Quadriplegia ([place]) 08/31/2019   S/P cervical spinal fusion 08/28/2019   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 [place], [place], [address] Phone: [phone]   Fax:  [phone]  Name: ANYELI HOCKENBURY MRN: [id number] Date of Birth: March 19, 1941

## 2021-05-30 NOTE — Progress Notes (Signed)
Cath Change/ Replacement  Patient is present today for a catheter change due to urinary retention.  8 ml of water was removed from the balloon, a 16 FR foley cath was removed with out difficulty.  Patient was cleaned and prepped in a sterile fashion with betadine. A 16 FR foley cath was replaced into the bladder no complications were noted Urine return was noted 10 ml and urine was clear/yellow in color. The balloon was filled with 50ml of sterile water. A  bag was attached for drainage.  A night bag was also given to the patient and patient was given instruction on how to change from one bag to another. Patient was given proper instruction on catheter care.    Performed by: Zara Council, PA-C  Follow up: One month for SPT exchange   I,Kailey Littlejohn,acting as a Education administrator for [employer], PA-C.,have documented all relevant documentation on the behalf of [name], PA-C,as directed by  [facility], PA-C while in the presence of Nuvia Hileman, PA-C.

## 2021-06-02 ENCOUNTER — Encounter: Payer: Self-pay | Admitting: Urology

## 2021-06-02 ENCOUNTER — Encounter: Payer: Medicare PPO | Admitting: Physical Therapy

## 2021-06-02 ENCOUNTER — Ambulatory Visit: Payer: Medicare PPO | Admitting: Urology

## 2021-06-02 ENCOUNTER — Other Ambulatory Visit: Payer: Self-pay

## 2021-06-02 VITALS — BP 124/77 | HR 78 | Ht 63.0 in | Wt 200.0 lb

## 2021-06-02 DIAGNOSIS — Z9359 Other cystostomy status: Secondary | ICD-10-CM

## 2021-06-04 ENCOUNTER — Ambulatory Visit: Payer: Medicare PPO

## 2021-06-04 ENCOUNTER — Other Ambulatory Visit: Payer: Self-pay

## 2021-06-04 DIAGNOSIS — S14123A 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, initial encounter: Secondary | ICD-10-CM

## 2021-06-04 DIAGNOSIS — R262 Difficulty in walking, not elsewhere classified: Secondary | ICD-10-CM

## 2021-06-04 DIAGNOSIS — M6281 Muscle weakness (generalized): Secondary | ICD-10-CM

## 2021-06-04 NOTE — Therapy (Signed)
[facility] [address] [place], [place], [address] Phone: [phone]   Fax:  [phone]  Physical Therapy Treatment  Patient Details  Name: Meredith Roach MRN: [id number] Date of Birth: 12-27-1940 Referring Provider (PT): [name], MD   Encounter Date: 06/04/2021   PT End of Session - 06/04/21 1315     Visit Number 61    Number of Visits 50    Date for PT Re-Evaluation 06/12/21    Authorization Time Period Cert 5/32/99-2/42/68, last progress note 04/23/21; current auth 16-Visits 05/26/21 - 07/16/21, progress note on 05/28/21    Authorization - Visit Number 3    Authorization - Number of Visits 12    Progress Note Due on Visit 35    PT Start Time 1130    PT Stop Time 1215    PT Time Calculation (min) 45 min    Equipment Utilized During Treatment Gait belt   Pt uses power wheelchair, clinic's front-wheeled walker utilized for gait   Activity Tolerance Patient tolerated treatment well    Behavior During Therapy WFL for tasks assessed/performed             Past Medical History:  Diagnosis Date   Arthritis    GERD (gastroesophageal reflux disease)    Gout    Neuromuscular disorder ([place])    Pre-diabetes     Past Surgical History:  Procedure Laterality Date   ANTERIOR CERVICAL DECOMPRESSION/DISCECTOMY FUSION 4 LEVELS N/A 08/28/2019   Procedure: ANTERIOR CERVICAL DECOMPRESSION/DISCECTOMY FUSION CERVICALTHREE-FOUR CERVICAL,FOUR-FIVE,CERVICAL FIVE-SIX,CERVICAL SIX-SEVEN.;  Surgeon: [name], MD;  Location: [place];  Service: Neurosurgery;  Laterality: N/A;  ANTERIOR   BACK SURGERY     CATARACT EXTRACTION W/PHACO Right 03/31/2018   Procedure: CATARACT EXTRACTION PHACO AND INTRAOCULAR LENS PLACEMENT ([place]);  Surgeon: [name], MD;  Location: [facility];  Service: Ophthalmology;  Laterality: Right;  [place] 00:39.7 CDE 4.35 Fluid Pack Lot # [id number]   CHOLECYSTECTOMY  1995   COLONOSCOPY WITH PROPOFOL N/A 06/07/2017    Procedure: COLONOSCOPY WITH PROPOFOL;  Surgeon: [name], MD;  Location: [facility] ENDOSCOPY;  Service: Endoscopy;  Laterality: N/A;   ESOPHAGOGASTRODUODENOSCOPY (EGD) WITH PROPOFOL N/A 06/07/2017   Procedure: ESOPHAGOGASTRODUODENOSCOPY (EGD) WITH PROPOFOL;  Surgeon: [name], MD;  Location: [facility]COPY;  Service: Endoscopy;  Laterality: N/A;   IR CATHETER TUBE CHANGE  05/10/2020   REDUCTION MAMMAPLASTY Bilateral 1994    There were no vitals filed for this visit.   Subjective Assessment - 06/04/21 1310     Subjective Pt reports she has good energy upon arrival to PT.  She states that she has been working on walking short laps around her house during the day.  She continues to use the hand board to stretch out her fingers/palm during the day.  She hopes to be able to transfer from her powerchair to the passenger seat to ride in her Lucianne Lei someday.    Patient is accompained by: Family member   husband   Pertinent History Patient is a 81 year old female s/p incomplete SCI. She had incomplete SCI following fall - C4 [place] D (DOI: 08/26/19). Primary activity limitations c difficulty walking, transferring, and completing independent functional mobility following incomplete SCI. She has undergone home health therapy for about one year. She and her husband reports she was able to transfer and ambulate with walker in home with PT walking beside her. Pt has practiced with front-wheel walker at home. 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  Patient reports using steady transfer aid for performing transfers chair to bed and W/C to standard chair. Her husband states that she is able to stand with steady device and can stand with her legs against the bed. Pt has ramp to get in her home for her power wheelchair. Patient lives in one level home. Pt uses tub bench that she can utilize to slide into shower; her daughter/husband assists with bathing. Pt has Lucianne Lei that can accommodate her power chair at  this time. Pt has concrete driveway to get up to her ramp. Pt has small threshold to get into her home - husband utilizes cardboard to get over threshold.    Limitations Walking;Standing;House hold activities;Other (comment)   transferring, bed mobility, bladder management   Patient Stated Goals to be able to walk more independently                TREATMENT FOTO: 27 today     Therapeutic Activities - patient education, repetitive task practice for improved performance of daily functional activities e.g. transferring, bed mobility, gait with various textures/surfaces   Patient independently managed components of wheelchair to initiate session: doffing safety belt, moving footrest, and doffing gait belt. Pt able to independently manage catheter drainage bag once removed from hook under power chair seat    Modified-independent stand-pivot transfer with FWW performed from chair to low mat at beginning of session and performed from low mat to chair at end of session today      Seated push/pull MRE with b/l UE's 2x12 using wand with PT to simulate push/pull functional task (ie: pushing door) MRE elbow flex/extension unilaterally 2x12 with PT to simulate push/pull functional task (ie: pull door)  Seated lifting foot up and over small 3# weight on floor to simulate clearing foot over edge of car for car transfer in sitting.  Practiced R and L LE unilaterally- required min A for L LE. 15x ea LE     Ther-ex  Seated toe raises: 2x10 Standing heel raises: 2x10 Standing marches 2x12 Standing scapular retraction: 3x8 with PT tactile cues, 3 sec holds   Sit to stand w/o UE support from elevated mat table 2 x 5; SBA with gait belt   Pt amb from low mat table out side door of clinic onto sidewalk outside clinic with FWW and PT SBA with gait belt; verbal cues for foot clearance L>R when she became more fatigued; 3 standing rest break for 15-20 sec during.  Distance: amb to 6 ft short of the bench  outside front of clinic then pt transfer into chair.  Pt independently transferred stand to sit in power chair at end of session, requires assistance for buckling seatbelt and footrest placement at end of session.          PT Short Term Goals - 05/28/21 1312       PT SHORT TERM GOAL #1   Title Patient will perform stand-pivot transfer from chair to bed with supervision level of assist and ModI setup of power chair as needed for transferring at home    Baseline 11/26/20: Max assist for setup and mod assist to perform sit to stand, unsteady pivot prior to performing stand to sit.    12/23/20: Patient is able to setup power chair for transfer ModI. Mod to Max assist for sit to stand. CGA level of assist for standing pivot and stand to sit on low mat.   01/15/21: Setup of power chair without assist; modified independent pivot and stand-to-sit;  ModA for initial set to stand prior to pivot.  02/10/21: ModI setup of power chair, ModA for initiation of sit to stand, modified-independent stand pivot transfer.     03/17/21: Performed with CGA during sit to stand only and supervision level of assist for remainder of stand-pivot transfer (performing from power chair to table).   04/23/21: Pt performed stand-pivot transfer from power chair to low mat with CGA only needed during sit to stand to guard posteriorly.    Time 4    Period Weeks    Status Partially Met    Target Date 04/07/21      PT SHORT TERM GOAL #2   Title Patient will perform standing toe tap to 3-inch surface (Airex pad) with bilateral upper extremity support indicative of improved ability to clear feet from floor during stepping and improved weight shift to each LE with AD use    Baseline 11/26/20: Limited toe clearance during bilateral LE swing phase with front-wheeled walker.   12/23/20: performed at previous follow-up with bilateral UE support.    Time 3    Period Weeks    Status Achieved    Target Date 12/17/20               PT  Long Term Goals - 05/28/21 1312       PT LONG TERM GOAL #1   Title Patient will demonstrate improved function as evidenced by a score of 35 on FOTO measure for full participation in activities at home and in the community.    Baseline 11/26/20: FOTO 12.   12/23/20: FOTO 24.    01/15/21: FOTO 23.   02/10/21: FOTO 38.   03/17/21: 49.   04/23/21: 30. 05/28/21: 27.    Time 16    Period Weeks    Status Partially Met   previously had score > 35   Target Date 06/25/21      PT LONG TERM GOAL #2   Title Patient will ambulate for 150 feet without LOB, with sufficient toe clearance to prevent loss of gait stability, and c proper AD placement with least-restrictive assistive device with supervision level of assist    Baseline 11/26/20: Gait x 40 feet with CGA.   12/23/20: CGA with dec toe clearance L>RLE.   01/15/21: Ambulated 160 feet without LOB, proper AD placement; good toe clearance with first 80 feet, but decrased L>R toe clearanc with fatigue.   02/10/21: Performed with FWW with good toe clearance with first 80 feet, decreasing L toe clearance with succesive steps.   03/17/21: Performed without LOB, no significant instability with bilateral UE support on front-wheel walker; decreased toe clearance during gait L>R.  04/23/21: Performed with no LOB, proper placement of FWW, and sufficient toe clearance to negotiate doorway threshold, sidewalk, and change in surface elevation.    Time 12    Period Weeks    Status Achieved    Target Date 04/10/21      PT LONG TERM GOAL #3   Title Patient will perform independent re-positioning in chair with use of closed-chain triceps on armrests as needed for independent wheelchair mobility with no verbal cueing or tactile cueing required    Baseline 11/26/20: Dependent re-positioning in wheelchair.   12/23/20: Performed with feet on fold-out platform of her power wheelchair IND.    Time 8    Period Weeks    Status Achieved    Target Date 12/23/20      PT LONG TERM GOAL #4    Title  Patient will perform simulated step-over task for clearing doorway threshold with use of front-wheeled walker as needed for entering/exiting home with supervision level of assistance and no LOB    Baseline 11/26/20: Pt currently uses power wheelchair to negotiate threshold into home.   12/23/20: Deferred.  01/15/21: Deferred.   02/10/21: Performed last week with step-over single line on floor with negligible height.    03/17/21: Performed over doorway threshold in clinic today with intermittent CGA and no LOB.  04/23/21: Performed today with supervision level of assist.    Time 16    Period Weeks    Status Achieved    Target Date 04/10/21      PT LONG TERM GOAL #5   Title Pt will be able to independently perform supine to sit transfer to promote independence with bed mobility    Baseline 05/28/21 min A    Time 6    Period Weeks    Status New    Target Date 07/08/21                   Plan - 06/04/21 1315     Clinical Impression Statement Pt able to work on walking endurance at end of session today; she required 3 brief standing breaks during ambulation on flat/incline sidewalk surfaces today before ending due to LE and trunk fatigue.  Worked on seated unilateral hip flexion activities to simulate lifting foot and clearing edge of her vehicle during car transfers.  She does require min A for L LE, R LE she can clear a 3 inch high obstacle independently.  Overall, she should continue to benefit from skilled PT to furthe rimprove her function and promote independence with bed mobility, transfers, ambulation, and ADLs.             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, initial encounter [facility])     Problem List Patient Active Problem List    Diagnosis Date Noted   Generalized anxiety disorder 01/24/2021   Suspected deep tissue injury 10/25/2020   Wheelchair dependence 11/27/2019   Spasticity 11/27/2019   Hyperkalemia    Steroid-induced hyperglycemia    Prediabetes    Neurogenic bowel    Neurogenic bladder    Neuropathic pain    Quadriplegia ([place]) 08/31/2019   S/P cervical spinal fusion 08/28/2019   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 [place], [place], [address] Phone: [phone]   Fax:  [phone]  Name: Meredith Roach MRN: [id number] Date of Birth: 26-Dec-1940

## 2021-06-09 ENCOUNTER — Other Ambulatory Visit: Payer: Self-pay

## 2021-06-09 ENCOUNTER — Ambulatory Visit: Payer: Medicare PPO

## 2021-06-09 ENCOUNTER — Encounter: Payer: Self-pay | Admitting: Physical Therapy

## 2021-06-09 DIAGNOSIS — R262 Difficulty in walking, not elsewhere classified: Secondary | ICD-10-CM

## 2021-06-09 DIAGNOSIS — M6281 Muscle weakness (generalized): Secondary | ICD-10-CM

## 2021-06-09 DIAGNOSIS — S14123A 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, initial encounter: Secondary | ICD-10-CM

## 2021-06-09 NOTE — Therapy (Signed)
[facility] [address] [place], [place], [address] Phone: [phone]   Fax:  [phone]  Physical Therapy Treatment  Patient Details  Name: Meredith Roach MRN: [id number] Date of Birth: Jan 23, 1941 Referring Provider (PT): [name], MD   Encounter Date: 06/09/2021   PT End of Session - 06/09/21 1219     Visit Number 48    Number of Visits 14    Date for PT Re-Evaluation 06/12/21    Authorization Time Period Cert 6/44/03-4/74/25, last progress note 04/23/21; current auth 16-Visits 05/26/21 - 07/16/21, progress note on 05/28/21    Authorization - Visit Number 3    Authorization - Number of Visits 12    Progress Note Due on Visit 70    PT Start Time 1135    PT Stop Time 1213    PT Time Calculation (min) 38 min    Equipment Utilized During Treatment Gait belt   Pt uses power wheelchair, clinic's front-wheeled walker utilized for gait   Activity Tolerance Patient tolerated treatment well    Behavior During Therapy WFL for tasks assessed/performed             Past Medical History:  Diagnosis Date   Arthritis    GERD (gastroesophageal reflux disease)    Gout    Neuromuscular disorder ([place])    Pre-diabetes     Past Surgical History:  Procedure Laterality Date   ANTERIOR CERVICAL DECOMPRESSION/DISCECTOMY FUSION 4 LEVELS N/A 08/28/2019   Procedure: ANTERIOR CERVICAL DECOMPRESSION/DISCECTOMY FUSION CERVICALTHREE-FOUR CERVICAL,FOUR-FIVE,CERVICAL FIVE-SIX,CERVICAL SIX-SEVEN.;  Surgeon: [name], MD;  Location: [place];  Service: Neurosurgery;  Laterality: N/A;  ANTERIOR   BACK SURGERY     CATARACT EXTRACTION W/PHACO Right 03/31/2018   Procedure: CATARACT EXTRACTION PHACO AND INTRAOCULAR LENS PLACEMENT ([place]);  Surgeon: [name], MD;  Location: [facility];  Service: Ophthalmology;  Laterality: Right;  [place] 00:39.7 CDE 4.35 Fluid Pack Lot # [id number]   CHOLECYSTECTOMY  1995   COLONOSCOPY WITH PROPOFOL N/A 06/07/2017    Procedure: COLONOSCOPY WITH PROPOFOL;  Surgeon: [name], MD;  Location: [facility];  Service: Endoscopy;  Laterality: N/A;   ESOPHAGOGASTRODUODENOSCOPY (EGD) WITH PROPOFOL N/A 06/07/2017   Procedure: ESOPHAGOGASTRODUODENOSCOPY (EGD) WITH PROPOFOL;  Surgeon: [name], MD;  Location: [facility] ENDOSCOPY;  Service: Endoscopy;  Laterality: N/A;   IR CATHETER TUBE CHANGE  05/10/2020   REDUCTION MAMMAPLASTY Bilateral 1994    There were no vitals filed for this visit.   Subjective Assessment - 06/09/21 1135     Subjective Pt denies falls. Normal L sided pain but is better than usual. States bed mobility is improving. wanting to focus on standing tolerance, balance, and core stability. Still having difficulty with opening/closing.    Patient is accompained by: Family member   husband   Pertinent History Patient is a 81 year old female s/p incomplete SCI. She had incomplete SCI following fall - C4 [place] D (DOI: 08/26/19). Primary activity limitations c difficulty walking, transferring, and completing independent functional mobility following incomplete SCI. She has undergone home health therapy for about one year. She and her husband reports she was able to transfer and ambulate with walker in home with PT walking beside her. Pt has practiced with front-wheel walker at home. 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 Patient reports using steady transfer aid for performing transfers chair to bed and W/C to standard chair. Her husband states that she is able to stand with steady device and can stand with  her legs against the bed. Pt has ramp to get in her home for her power wheelchair. Patient lives in one level home. Pt uses tub bench that she can utilize to slide into shower; her daughter/husband assists with bathing. Pt has Lucianne Lei that can accommodate her power chair at this time. Pt has concrete driveway to get up to her ramp. Pt has small threshold to get into her home - husband  utilizes cardboard to get over threshold.    Limitations Walking;Standing;House hold activities;Other (comment)   transferring, bed mobility, bladder management   Patient Stated Goals to be able to walk more independently              Patient independently managed components of wheelchair to initiate session: doffing safety belt, moving footrest, and donning gait belt (pt needed assist in fastening gait belt due to decreased L hand dexterity). Pt able to independently manage catheter drainage bag once removed from hook under power chair seat    Modified-independent stand-pivot transfer with FWW performed from chair to low mat at beginning of session and performed from low mat to chair at end of session today     There.ex:  Seated theraband chest press to imitate pushing open doors: GTB, 2x8 requiring loop tied for success holding with LUE due to spasticity in hand and limited grip strength.   Seated Unilateral scap retraction to imitate opening/closing door and seated core stability: Blue TB on RUE, GTB on LUE with same need for loop tied for LUE. 2x8. Min VC's for form/technique.    Neuro Re-Ed:   Amb with RW along floor ladder alternating cone taps for improving hip clearance. CGA provided. Pt unable to tap top of cone but is < 1" away from being able to perform. X2 laps.   Standing reaches outside BOS cone grabs with SUE support on RW: 2x7 cones/UE to improve dynamic core stability, standing ADL task completions, and L hand grip strength. CGA provided.   RW amb step up and over x2 airex foam pads in // bars. X2 leading with RLE onto airex pad then x2 leading with LLE requiring minA at LE for foot clearance due to fatigue being at end of session. Good stability throughout with each LE on foam pad.     Standing and seated rest breaks required intermittently throughout session due to fatigue.   Pt independently transferred stand to sit in power chair at end of session, requires  assistance for buckling seatbelt and footrest placement at end of session and placement of indwelling foley catheter onto hook.    PT Education - 06/09/21 1218     Education Details form/technique with exercise    Person(s) Educated Patient    Methods Explanation;Tactile cues;Verbal cues    Comprehension Verbalized understanding;Returned demonstration              PT Short Term Goals - 05/28/21 1312       PT SHORT TERM GOAL #1   Title Patient will perform stand-pivot transfer from chair to bed with supervision level of assist and ModI setup of power chair as needed for transferring at home    Baseline 11/26/20: Max assist for setup and mod assist to perform sit to stand, unsteady pivot prior to performing stand to sit.    12/23/20: Patient is able to setup power chair for transfer ModI. Mod to Max assist for sit to stand. CGA level of assist for standing pivot and stand to sit on low mat.  01/15/21: Setup of power chair without assist; modified independent pivot and stand-to-sit; ModA for initial set to stand prior to pivot.  02/10/21: ModI setup of power chair, ModA for initiation of sit to stand, modified-independent stand pivot transfer.     03/17/21: Performed with CGA during sit to stand only and supervision level of assist for remainder of stand-pivot transfer (performing from power chair to table).   04/23/21: Pt performed stand-pivot transfer from power chair to low mat with CGA only needed during sit to stand to guard posteriorly.    Time 4    Period Weeks    Status Partially Met    Target Date 04/07/21      PT SHORT TERM GOAL #2   Title Patient will perform standing toe tap to 3-inch surface (Airex pad) with bilateral upper extremity support indicative of improved ability to clear feet from floor during stepping and improved weight shift to each LE with AD use    Baseline 11/26/20: Limited toe clearance during bilateral LE swing phase with front-wheeled walker.   12/23/20: performed  at previous follow-up with bilateral UE support.    Time 3    Period Weeks    Status Achieved    Target Date 12/17/20               PT Long Term Goals - 05/28/21 1312       PT LONG TERM GOAL #1   Title Patient will demonstrate improved function as evidenced by a score of 35 on FOTO measure for full participation in activities at home and in the community.    Baseline 11/26/20: FOTO 12.   12/23/20: FOTO 24.    01/15/21: FOTO 23.   02/10/21: FOTO 38.   03/17/21: 49.   04/23/21: 30. 05/28/21: 27.    Time 16    Period Weeks    Status Partially Met   previously had score > 35   Target Date 06/25/21      PT LONG TERM GOAL #2   Title Patient will ambulate for 150 feet without LOB, with sufficient toe clearance to prevent loss of gait stability, and c proper AD placement with least-restrictive assistive device with supervision level of assist    Baseline 11/26/20: Gait x 40 feet with CGA.   12/23/20: CGA with dec toe clearance L>RLE.   01/15/21: Ambulated 160 feet without LOB, proper AD placement; good toe clearance with first 80 feet, but decrased L>R toe clearanc with fatigue.   02/10/21: Performed with FWW with good toe clearance with first 80 feet, decreasing L toe clearance with succesive steps.   03/17/21: Performed without LOB, no significant instability with bilateral UE support on front-wheel walker; decreased toe clearance during gait L>R.  04/23/21: Performed with no LOB, proper placement of FWW, and sufficient toe clearance to negotiate doorway threshold, sidewalk, and change in surface elevation.    Time 12    Period Weeks    Status Achieved    Target Date 04/10/21      PT LONG TERM GOAL #3   Title Patient will perform independent re-positioning in chair with use of closed-chain triceps on armrests as needed for independent wheelchair mobility with no verbal cueing or tactile cueing required    Baseline 11/26/20: Dependent re-positioning in wheelchair.   12/23/20: Performed with feet on  fold-out platform of her power wheelchair IND.    Time 8    Period Weeks    Status Achieved    Target Date 12/23/20  PT LONG TERM GOAL #4   Title Patient will perform simulated step-over task for clearing doorway threshold with use of front-wheeled walker as needed for entering/exiting home with supervision level of assistance and no LOB    Baseline 11/26/20: Pt currently uses power wheelchair to negotiate threshold into home.   12/23/20: Deferred.  01/15/21: Deferred.   02/10/21: Performed last week with step-over single line on floor with negligible height.    03/17/21: Performed over doorway threshold in clinic today with intermittent CGA and no LOB.  04/23/21: Performed today with supervision level of assist.    Time 16    Period Weeks    Status Achieved    Target Date 04/10/21      PT LONG TERM GOAL #5   Title Pt will be able to independently perform supine to sit transfer to promote independence with bed mobility    Baseline 05/28/21 min A    Time 6    Period Weeks    Status New    Target Date 07/08/21                   Plan - 06/09/21 1221     Clinical Impression Statement Continuing PT POC with focus on Le strength, dynamic balance, foot clearance, and core stability. Pt consistently demonstrating STS without UE support with adequate forward trunk lean to assist in standing without need for PT assist. Pt continuing to demonstrate decreased foot clerance L >R but is close to being able to flex at hip and knee to ~6" cones. Whenw pt fatigues she demo's decreased ability to clear L foot during swing phase. Overall pt tolerating session well with standing balance and gait tasks despite fatigue at end of session. Pt will continue to benefit from skilled PT services to progress independence with functional mobility and reduced risk of falls.    Personal Factors and Comorbidities Age;Comorbidity 3+;Time since onset of injury/illness/exacerbation    Comorbidities GERD,  pre-diabetes, obesity, gout, s/p C3-7 ACDF    Examination-Activity Limitations Bathing;Continence;Reach Overhead;Stairs;Bed Mobility;Dressing;Self Feeding;Stand;Hygiene/Grooming;Toileting;Transfers;Locomotion Level    Examination-Participation Restrictions Community Activity;Yard Work;Laundry;Cleaning;Shop    Stability/Clinical Decision Making Evolving/Moderate complexity    Rehab Potential Fair    PT Frequency Other (comment)   2-3x/week   PT Duration Other (comment)   16 weeks   PT Treatment/Interventions Electrical Stimulation;Gait training;Stair training;Functional mobility training;Therapeutic exercise;Balance training;Neuromuscular re-education;Wheelchair mobility training;Patient/family education;Therapeutic activities    PT Next Visit Plan Update outcome meaures and goals, progress note, Gait re-training with FWW and facilitating improved independence with AD, task practice for transferring, LE strengthening, UE AROM and strengthening to assist with functional mobility and transfers.    PT Home Exercise Plan Pt continuing with home health exercises. Pt continuing with task practice for rolling, bridging, supine to sit transfers in bed; walking with supervision/decreasing level fo assist with FWW; reaching and grasping with LUE. 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, initial encounter ([place])  Difficulty in walking, not elsewhere classified     Problem List Patient Active Problem List   Diagnosis Date Noted  Generalized anxiety disorder  01/24/2021   Suspected deep tissue injury 10/25/2020   Wheelchair dependence 11/27/2019   Spasticity 11/27/2019   Hyperkalemia    Steroid-induced hyperglycemia    Prediabetes    Neurogenic bowel    Neurogenic bladder    Neuropathic pain    Quadriplegia ([place]) 08/31/2019   S/P cervical spinal fusion 08/28/2019   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 Fairly IV, PT, DPT Physical Therapist- [facility]  06/09/2021, 12:35 PM  [place] [facility] [address] [place], [place], [address] Phone: [phone]   Fax:  [phone]  Name: Meredith Roach MRN: [id number] Date of Birth: 14-Oct-1940

## 2021-06-11 ENCOUNTER — Encounter: Payer: Self-pay | Admitting: Physical Therapy

## 2021-06-11 ENCOUNTER — Ambulatory Visit: Payer: Medicare PPO | Attending: Physical Medicine and Rehabilitation | Admitting: Physical Therapy

## 2021-06-11 ENCOUNTER — Other Ambulatory Visit: Payer: Self-pay

## 2021-06-11 DIAGNOSIS — S14123A 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, initial encounter: Secondary | ICD-10-CM | POA: Diagnosis present

## 2021-06-11 DIAGNOSIS — M6281 Muscle weakness (generalized): Secondary | ICD-10-CM | POA: Insufficient documentation

## 2021-06-11 DIAGNOSIS — R262 Difficulty in walking, not elsewhere classified: Secondary | ICD-10-CM | POA: Insufficient documentation

## 2021-06-11 NOTE — Therapy (Signed)
[facility] [address] [place], [place], [address] Phone: [phone]   Fax:  [phone]  Physical Therapy Treatment  Patient Details  Name: Meredith Roach MRN: [id number] Date of Birth: 07-11-1940 Referring Provider (PT): [name], MD   Encounter Date: 06/11/2021   PT End of Session - 06/11/21 2119     Visit Number 71    Number of Visits 92    Date for PT Re-Evaluation 06/12/21    Authorization Time Period Cert 10/23/43-11/19/96, last progress note 04/23/21; current auth 16-Visits 05/26/21 - 07/16/21, progress note on 05/28/21    Authorization - Visit Number 5    Authorization - Number of Visits 16    Progress Note Due on Visit 65    PT Start Time 1150    PT Stop Time 1240    PT Time Calculation (min) 50 min    Equipment Utilized During Treatment Gait belt   Pt uses power wheelchair, clinic's front-wheeled walker utilized for gait   Activity Tolerance Patient tolerated treatment well    Behavior During Therapy WFL for tasks assessed/performed             Past Medical History:  Diagnosis Date   Arthritis    GERD (gastroesophageal reflux disease)    Gout    Neuromuscular disorder ([place])    Pre-diabetes     Past Surgical History:  Procedure Laterality Date   ANTERIOR CERVICAL DECOMPRESSION/DISCECTOMY FUSION 4 LEVELS N/A 08/28/2019   Procedure: ANTERIOR CERVICAL DECOMPRESSION/DISCECTOMY FUSION CERVICALTHREE-FOUR CERVICAL,FOUR-FIVE,CERVICAL FIVE-SIX,CERVICAL SIX-SEVEN.;  Surgeon: [name], MD;  Location: [facility];  Service: Neurosurgery;  Laterality: N/A;  ANTERIOR   BACK SURGERY     CATARACT EXTRACTION W/PHACO Right 03/31/2018   Procedure: CATARACT EXTRACTION PHACO AND INTRAOCULAR LENS PLACEMENT ([place]);  Surgeon: [name], MD;  Location: [facility];  Service: Ophthalmology;  Laterality: Right;  [place] 00:39.7 CDE 4.35 Fluid Pack Lot # [id number]   CHOLECYSTECTOMY  1995   COLONOSCOPY WITH PROPOFOL N/A 06/07/2017    Procedure: COLONOSCOPY WITH PROPOFOL;  Surgeon: [name], MD;  Location: [facility];  Service: Endoscopy;  Laterality: N/A;   ESOPHAGOGASTRODUODENOSCOPY (EGD) WITH PROPOFOL N/A 06/07/2017   Procedure: ESOPHAGOGASTRODUODENOSCOPY (EGD) WITH PROPOFOL;  Surgeon: [name], MD;  Location: [facility];  Service: Endoscopy;  Laterality: N/A;   IR CATHETER TUBE CHANGE  05/10/2020   REDUCTION MAMMAPLASTY Bilateral 1994    There were no vitals filed for this visit.   Subjective Assessment - 06/11/21 2116     Subjective Patient reports usual L-sided sensation of "tightness" that is relatively mild compared to some days. She reports tolerating last session well and reports improvement in functional mobility with PT. She reports she mainly wants to work on core/trunk strength.    Patient is accompained by: Family member   husband   Pertinent History Patient is a 81 year old female s/p incomplete SCI. She had incomplete SCI following fall - C4 [place] D (DOI: 08/26/19). Primary activity limitations c difficulty walking, transferring, and completing independent functional mobility following incomplete SCI. She has undergone home health therapy for about one year. She and her husband reports she was able to transfer and ambulate with walker in home with PT walking beside her. Pt has practiced with front-wheel walker at home. 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 Patient reports using steady transfer aid for performing transfers chair to bed and W/C to standard chair. Her husband states that she is able to stand  with steady device and can stand with her legs against the bed. Pt has ramp to get in her home for her power wheelchair. Patient lives in one level home. Pt uses tub bench that she can utilize to slide into shower; her daughter/husband assists with bathing. Pt has [employer]i that can accommodate her power chair at this time. Pt has concrete driveway to get up to her ramp. Pt has small  threshold to get into her home - husband utilizes cardboard to get over threshold.    Limitations Walking;Standing;House hold activities;Other (comment)   transferring, bed mobility, bladder management   Patient Stated Goals to be able to walk more independently               Patient independently managed components of wheelchair to initiate session: doffing safety belt and moving hinged footrest. ModA required with donning gait belt due to decreased motor control and spasticity in L upper limb. Pt able to independently manage catheter drainage bag once removed from hook under power chair seat    Neuro Re-Ed:   Ambulation through doorway threshold and along outside sidewalk with FWW, around perimeter of building including walking along curb cuts x 4, incline/decline, and grassy terrain. Pt walked to bench adjacent to front door of office; x 15 minutes, with CGA during obstacle negotiation and close supervision only on even ground. Pt maintaining upright stance while negotiating varying incline/decline to challenge trunk control            Outside BOS cone grabs with SUE support on RW:  x2 min with LUE to improve dynamic core stability, standing ADL task completions, and L hand grip strength. CGA/supervision provided.              RW amb step up and over x2 airex foam pads in // bars. X2 leading with RLE onto airex pad then x1 leading with LLE requiring minA at LE for foot clearance due to fatigue being at end of session. Good stability throughout with each LE on foam pad.     Standing and seated rest breaks required intermittently throughout session due to motor fatigue.     Pt independently transferred stand to sit in power chair at end of session, requires assistance for buckling seatbelt at end of session and placement of indwelling foley catheter onto hook. Pt IND with footrest placement     *next visit* Seated theraband chest press to imitate pushing open doors: GTB, 2x8 requiring  loop tied for success holding with LUE due to spasticity in hand and limited grip strength.  Seated Unilateral scap retraction to imitate opening/closing door and seated core stability: Blue TB on RUE, GTB on LUE with same need for loop tied for LUE. 2x8. Min VC's for form/technique.    *not today*  Amb with RW along floor ladder alternating cone taps for improving hip clearance. CGA provided. Pt unable to tap top of cone but is < 1" away from being able to perform. X2 laps.              Standing reaches o     ASSESSMENT Patient is able to progress community-level gait with FWW to include walking over grass and uneven terrain outside of the office building today. Pt is carefully guarded with change in surface elevation and change in surface texture, but she has no significant LOB today. She is able to perform stepping up onto Airex with RLE leading with bilat UE support on walker - this skill is  needed for community level mobility and negotiating changes in surface elevation. She does have notable motor fatigue following outdoor walk and is notably challenged with stepping up/over task and reaching drill at end of session. Pt will continue to benefit from skilled PT services to progress independence with functional mobility and reduced risk of falls.     PT Short Term Goals - 05/28/21 1312       PT SHORT TERM GOAL #1   Title Patient will perform stand-pivot transfer from chair to bed with supervision level of assist and ModI setup of power chair as needed for transferring at home    Baseline 11/26/20: Max assist for setup and mod assist to perform sit to stand, unsteady pivot prior to performing stand to sit.    12/23/20: Patient is able to setup power chair for transfer ModI. Mod to Max assist for sit to stand. CGA level of assist for standing pivot and stand to sit on low mat.   01/15/21: Setup of power chair without assist; modified independent pivot and stand-to-sit; ModA for initial set to  stand prior to pivot.  02/10/21: ModI setup of power chair, ModA for initiation of sit to stand, modified-independent stand pivot transfer.     03/17/21: Performed with CGA during sit to stand only and supervision level of assist for remainder of stand-pivot transfer (performing from power chair to table).   04/23/21: Pt performed stand-pivot transfer from power chair to low mat with CGA only needed during sit to stand to guard posteriorly.    Time 4    Period Weeks    Status Partially Met    Target Date 04/07/21      PT SHORT TERM GOAL #2   Title Patient will perform standing toe tap to 3-inch surface (Airex pad) with bilateral upper extremity support indicative of improved ability to clear feet from floor during stepping and improved weight shift to each LE with AD use    Baseline 11/26/20: Limited toe clearance during bilateral LE swing phase with front-wheeled walker.   12/23/20: performed at previous follow-up with bilateral UE support.    Time 3    Period Weeks    Status Achieved    Target Date 12/17/20               PT Long Term Goals - 05/28/21 1312       PT LONG TERM GOAL #1   Title Patient will demonstrate improved function as evidenced by a score of 35 on FOTO measure for full participation in activities at home and in the community.    Baseline 11/26/20: FOTO 12.   12/23/20: FOTO 24.    01/15/21: FOTO 23.   02/10/21: FOTO 38.   03/17/21: 49.   04/23/21: 30. 05/28/21: 27.    Time 16    Period Weeks    Status Partially Met   previously had score > 35   Target Date 06/25/21      PT LONG TERM GOAL #2   Title Patient will ambulate for 150 feet without LOB, with sufficient toe clearance to prevent loss of gait stability, and c proper AD placement with least-restrictive assistive device with supervision level of assist    Baseline 11/26/20: Gait x 40 feet with CGA.   12/23/20: CGA with dec toe clearance L>RLE.   01/15/21: Ambulated 160 feet without LOB, proper AD placement; good toe  clearance with first 80 feet, but decrased L>R toe clearanc with fatigue.   02/10/21: Performed with  FWW with good toe clearance with first 80 feet, decreasing L toe clearance with succesive steps.   03/17/21: Performed without LOB, no significant instability with bilateral UE support on front-wheel walker; decreased toe clearance during gait L>R.  04/23/21: Performed with no LOB, proper placement of FWW, and sufficient toe clearance to negotiate doorway threshold, sidewalk, and change in surface elevation.    Time 12    Period Weeks    Status Achieved    Target Date 04/10/21      PT LONG TERM GOAL #3   Title Patient will perform independent re-positioning in chair with use of closed-chain triceps on armrests as needed for independent wheelchair mobility with no verbal cueing or tactile cueing required    Baseline 11/26/20: Dependent re-positioning in wheelchair.   12/23/20: Performed with feet on fold-out platform of her power wheelchair IND.    Time 8    Period Weeks    Status Achieved    Target Date 12/23/20      PT LONG TERM GOAL #4   Title Patient will perform simulated step-over task for clearing doorway threshold with use of front-wheeled walker as needed for entering/exiting home with supervision level of assistance and no LOB    Baseline 11/26/20: Pt currently uses power wheelchair to negotiate threshold into home.   12/23/20: Deferred.  01/15/21: Deferred.   02/10/21: Performed last week with step-over single line on floor with negligible height.    03/17/21: Performed over doorway threshold in clinic today with intermittent CGA and no LOB.  04/23/21: Performed today with supervision level of assist.    Time 16    Period Weeks    Status Achieved    Target Date 04/10/21      PT LONG TERM GOAL #5   Title Pt will be able to independently perform supine to sit transfer to promote independence with bed mobility    Baseline 05/28/21 min A    Time 6    Period Weeks    Status New    Target Date  07/08/21                   Plan - 06/12/21 0849     Clinical Impression Statement Patient is able to progress community-level gait with FWW to include walking over grass and uneven terrain outside of the office building today. Pt is carefully guarded with change in surface elevation and change in surface texture, but she has no significant LOB today. She is able to perform stepping up onto Airex with RLE leading with bilat UE support on walker - this skill is needed for community level mobility and negotiating changes in surface elevation. She does have notable motor fatigue following outdoor walk and is notably challenged with stepping up/over task and reaching drill at end of session. Pt will continue to benefit from skilled PT services to progress independence with functional mobility and reduced risk of falls.    Personal Factors and Comorbidities Age;Comorbidity 3+;Time since onset of injury/illness/exacerbation    Comorbidities GERD, pre-diabetes, obesity, gout, s/p C3-7 ACDF    Examination-Activity Limitations Bathing;Continence;Reach Overhead;Stairs;Bed Mobility;Dressing;Self Feeding;Stand;Hygiene/Grooming;Toileting;Transfers;Locomotion Level    Examination-Participation Restrictions Community Activity;Yard Work;Laundry;Cleaning;Shop    Stability/Clinical Decision Making Evolving/Moderate complexity    Rehab Potential Fair    PT Frequency Other (comment)   2-3x/week   PT Duration Other (comment)   16 weeks   PT Treatment/Interventions Electrical Stimulation;Gait training;Stair training;Functional mobility training;Therapeutic exercise;Balance training;Neuromuscular re-education;Wheelchair mobility training;Patient/family education;Therapeutic activities    PT Next  Visit Plan *Re-certification next visit*  Gait re-training with FWW and facilitating improved independence with AD, task practice for transferring, LE strengthening, UE AROM and strengthening to assist with functional  mobility and transfers.    PT Home Exercise Plan Pt continuing with home health exercises. Pt continuing with task practice for rolling, bridging, supine to sit transfers in bed; walking with supervision/decreasing level fo assist with FWW; reaching and grasping with LUE. 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, initial encounter ([place])  Difficulty in walking, not elsewhere classified     Problem List Patient Active Problem List   Diagnosis Date Noted   Generalized anxiety disorder 01/24/2021   Suspected deep tissue injury 10/25/2020   Wheelchair dependence 11/27/2019   Spasticity 11/27/2019   Hyperkalemia    Steroid-induced hyperglycemia    Prediabetes    Neurogenic bowel    Neurogenic bladder    Neuropathic pain    Quadriplegia ([place]) 08/31/2019   S/P cervical spinal fusion 08/28/2019   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 [place], [place], [address] Phone: [phone]   Fax:  [phone]  Name: Meredith Roach MRN: [id number] Date of Birth: Jun 13, 1940

## 2021-06-16 ENCOUNTER — Ambulatory Visit: Payer: Medicare PPO | Admitting: Physical Therapy

## 2021-06-16 ENCOUNTER — Other Ambulatory Visit: Payer: Self-pay

## 2021-06-16 ENCOUNTER — Encounter: Payer: Self-pay | Admitting: Physical Medicine and Rehabilitation

## 2021-06-16 DIAGNOSIS — M6281 Muscle weakness (generalized): Secondary | ICD-10-CM

## 2021-06-16 DIAGNOSIS — R262 Difficulty in walking, not elsewhere classified: Secondary | ICD-10-CM

## 2021-06-16 DIAGNOSIS — S14123A 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, initial encounter: Secondary | ICD-10-CM

## 2021-06-16 NOTE — Therapy (Signed)
[place] [facility] [address]. [place], [place], [address] Phone: [phone]   Fax:  [phone]  Physical Therapy Treatment/Goal Update and Re-certification  Patient Details  Name: Meredith Roach MRN: [id number] Date of Birth: 1940-10-28 Referring Provider (PT): [name], MD   Encounter Date: 06/16/2021   PT End of Session - 06/16/21 1152     Visit Number 66    Number of Visits 69    Date for PT Re-Evaluation 06/12/21    Authorization Time Period Cert 0/26/37-8/58/85, last progress note 04/23/21; current auth 16-Visits 05/26/21 - 07/16/21, progress note on 05/28/21    Authorization - Visit Number 6    Authorization - Number of Visits 16    Progress Note Due on Visit 85    PT Start Time 1149    PT Stop Time 1235    PT Time Calculation (min) 46 min    Equipment Utilized During Treatment Gait belt   Pt uses power wheelchair, clinic's front-wheeled walker utilized for gait   Activity Tolerance Patient tolerated treatment well    Behavior During Therapy WFL for tasks assessed/performed             Past Medical History:  Diagnosis Date   Arthritis    GERD (gastroesophageal reflux disease)    Gout    Neuromuscular disorder ([place])    Pre-diabetes     Past Surgical History:  Procedure Laterality Date   ANTERIOR CERVICAL DECOMPRESSION/DISCECTOMY FUSION 4 LEVELS N/A 08/28/2019   Procedure: ANTERIOR CERVICAL DECOMPRESSION/DISCECTOMY FUSION CERVICALTHREE-FOUR CERVICAL,FOUR-FIVE,CERVICAL FIVE-SIX,CERVICAL SIX-SEVEN.;  Surgeon: [name], MD;  Location: [facility];  Service: Neurosurgery;  Laterality: N/A;  ANTERIOR   BACK SURGERY     CATARACT EXTRACTION W/PHACO Right 03/31/2018   Procedure: CATARACT EXTRACTION PHACO AND INTRAOCULAR LENS PLACEMENT ([place]);  Surgeon: [name], MD;  Location: [facility];  Service: Ophthalmology;  Laterality: Right;  [place] 00:39.7 CDE 4.35 Fluid Pack Lot # [id number]   CHOLECYSTECTOMY  1995   COLONOSCOPY WITH  PROPOFOL N/A 06/07/2017   Procedure: COLONOSCOPY WITH PROPOFOL;  Surgeon: [name], MD;  Location: [facility] ENDOSCOPY;  Service: Endoscopy;  Laterality: N/A;   ESOPHAGOGASTRODUODENOSCOPY (EGD) WITH PROPOFOL N/A 06/07/2017   Procedure: ESOPHAGOGASTRODUODENOSCOPY (EGD) WITH PROPOFOL;  Surgeon: [name], MD;  Location[facility] END[facility]COPY;  Service: Endoscopy;  Laterality: N/A;   IR CATHETER TUBE CHANGE  05/10/2020   REDUCTION MAMMAPLASTY Bilateral 1994    There were no vitals filed for this visit.   Subjective Assessment - 06/16/21 1156     Subjective Patient reports 50% SANE score at this time. Patient reports she does need improved core strength. Patient reports she wants improved ability to remain upright with standing and walking. Patient reports she wants improved use of her L hand and her L leg. Patient reports good progress to date.    Patient is accompained by: Family member   husband   Pertinent History Patient is a 81 year old female s/p incomplete SCI. She had incomplete SCI following fall - C4 [place] D (DOI: 08/26/19). Primary activity limitations c difficulty walking, transferring, and completing independent functional mobility following incomplete SCI. She has undergone home health therapy for about one year. She and her husband reports she was able to transfer and ambulate with walker in home with PT walking beside her. Pt has practiced with front-wheel walker at home. 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 Patient reports using steady transfer aid for performing transfers chair to bed and  W/C to standard chair. Her husband states that she is able to stand with steady device and can stand with her legs against the bed. Pt has ramp to get in her home for her power wheelchair. Patient lives in one level home. Pt uses tub bench that she can utilize to slide into shower; her daughter/husband assists with bathing. Pt has Luc[employer]ne Lei that can accommodate her power chair at this time.  Pt has concrete driveway to get up to her ramp. Pt has small threshold to get into her home - husband utilizes cardboard to get over threshold.    Limitations Walking;Standing;House hold activities;Other (comment)   transferring, bed mobility, bladder management   Patient Stated Goals to be able to walk more independently                 OBJECTIVE     Gait With FWW: Fair posture c gait that with increasing thoracolumbar flexoin following fatigue with decreased gait velocity, good toe clearance with initiation of gait with decreasing L toe clearance following > 150 feet of walking and notable motor fatigue      FUNCTIONAL TASKS Stand Pivot Transfer: Independent for power chair placement relative to low mat. MinA only needed transiently at top of sit to stand to guard posteriorly (prevent uncontrolled descent to power chair or posterior fall); Modified-independent for standing pivot with FWW and stand-to-sit.    Rolling: Independent roll to L as needed for bed transfers at home. MaxA required for roll to R. Modified-independent with bilateral rolling with use of upper limb assist/adaptive equipment (patient grabbing onto dowel with R hand)   Supine to/from sit: IND with moving lower limbs to edge of bed for supine to sit. MaxA for trunk for sidelying to sitting transfer; pt completing lateral flexion in sidelying and approximately 1/3 of transfer prior to needing assist from therapist. IND with sitting to supine with exception of MaxA required to manage lower limbs once patient assumes sidelying. Pt is IND with scooting once in hooklying position.           TREATMENT   Patient independently managed components of wheelchair to initiate session: doffing safety belt and moving hinged footrest. ModA required with donning gait belt due to decreased motor control and spasticity in L upper limb. Pt able to independently manage catheter drainage bag once removed from hook under power chair  seat     Therapeutic Activities:  Ambulate laps in gym x 2 with PT on patient's L side with intermittent contact guard; verbal cueing for foot clearance as needed  Bed mobility task practice: bilateral rolling R and L x 2; performed additional repetition each direction with use of dowel for R upper limb assist to facilitate rolling as needed for change in position when lying at home. Performed supine to sit and sitting to supine (details on level of assist noted above)  Goal update performed (see above and updated Goal section below)   Pt/family education: Discussed with patient and her spouse current progress attained in PT, justification for continuing therapy, anticipated plateau in function that may be reached in the future given the level of her SCI and length of time from her initial injury; discussed continued POC with possible transition to maintenance program and home exercise if pt plateaus with progress    Neuro Re-Ed:    In // bars:              RW amb step up and over x2 airex foam pads in //  bars. X3 leading with RLE onto airex pad then x3 leading with LLE requiring CGA only for foot clearance.    Reaching and grasping task using varying-size balls; moving from on tray to adjacent tray on high table; x 5 minutes     Standing and seated rest breaks required intermittently throughout session due to motor fatigue.      Pt independently transferred stand to sit in power chair at end of session, requires assistance for buckling seatbelt at end of session and placement of indwelling foley catheter onto hook. Pt IND with footrest placement       *next visit* Seated theraband chest press to imitate pushing open doors: GTB, 2x8 requiring loop tied for success holding with LUE due to spasticity in hand and limited grip strength.  Seated Unilateral scap retraction to imitate opening/closing door and seated core stability: Blue TB on RUE, GTB on LUE with same need for loop tied for  LUE. 2x8. Min VC's for form/technique.      *not today*   Ambulation through doorway threshold and along outside sidewalk with FWW, around perimeter of building including walking along curb cuts x 4, incline/decline, and grassy terrain. Pt walked to bench adjacent to front door of office; x 15 minutes, with CGA during obstacle negotiation and close supervision only on even ground. Pt maintaining upright stance while negotiating varying incline/decline to challenge trunk control  Amb with RW along floor ladder alternating cone taps for improving hip clearance. CGA provided. Pt unable to tap top of cone but is < 1" away from being able to perform. X2 laps.              Standing reaches o         ASSESSMENT Patient has vastly improved ability to ambulate  over varying surfaces and change in surface elevation. Step-up onto small surface will be needed for car transfer into [employer] at home. Wheelchair-accessible Luc[employer] Lei is still patient's primary means of getting to appointments and social outings in town. She has significantly improved distance she is able to ambulate with her FWW. She demonstrates no further progress with stand-pivot transfers relative to last progress note and has ongoing difficulties with supine to sit due to truncal weakness and L-sided upper and lower limb weakness. Pt is able to perform modified-independent rolling as needed for bed mobility at home with use of dowel to pull with her R upper limb. She has improved FOTO score and surpassed predicted FOTO score. Pt has made good progress, but is still significantly challenged with bed mobility tasks. Pt and her spouse discussed expected plateau in function discussed with physician; informed patient that we will transition to home-based program and maintenance phase if she plateaus in measurable progress in PT. Pt will continue to benefit from skilled PT services to progress independence with functional mobility and reduced risk of falls.        PT Short Term Goals - 06/16/21 1155       PT SHORT TERM GOAL #1   Title Patient will perform stand-pivot transfer from chair to bed with supervision level of assist and ModI setup of power chair as needed for transferring at home    Baseline 11/26/20: Max assist for setup and mod assist to perform sit to stand, unsteady pivot prior to performing stand to sit.    12/23/20: Patient is able to setup power chair for transfer ModI. Mod to Max assist for sit to stand. CGA level of assist for standing  pivot and stand to sit on low mat.   01/15/21: Setup of power chair without assist; modified independent pivot and stand-to-sit; ModA for initial set to stand prior to pivot.  02/10/21: ModI setup of power chair, ModA for initiation of sit to stand, modified-independent stand pivot transfer.     03/17/21: Performed with CGA during sit to stand only and supervision level of assist for remainder of stand-pivot transfer (performing from power chair to table).   04/23/21: Pt performed stand-pivot transfer from power chair to low mat with CGA only needed during sit to stand to guard posteriorly.  06/16/21: Stand-pivot transfer from wheelchair to low mat performed with MinA required briefly to prevent posterior fall, remainder of pivot and sitting onto low mat was performed modified-independent    Time 4    Period Weeks    Status Partially Met    Target Date 07/15/21      PT SHORT TERM GOAL #2   Title Patient will perform standing toe tap to 3-inch surface (Airex pad) with bilateral upper extremity support indicative of improved ability to clear feet from floor during stepping and improved weight shift to each LE with AD use    Baseline 11/26/20: Limited toe clearance during bilateral LE swing phase with front-wheeled walker.   12/23/20: performed at previous follow-up with bilateral UE support.    Time 3    Period Weeks    Status Achieved    Target Date 12/17/20               PT Long Term Goals - 06/16/21  1153       PT LONG TERM GOAL #1   Title Patient will demonstrate improved function as evidenced by a score of 35 on FOTO measure for full participation in activities at home and in the community.    Baseline 11/26/20: FOTO 12.   12/23/20: FOTO 24.    01/15/21: FOTO 23.   02/10/21: FOTO 38.   03/17/21: 49.   04/23/21: 30. 05/28/21: 27.  06/16/21: 47.    Time 16    Period Weeks    Status Achieved   previously had score > 35   Target Date 06/25/21      PT LONG TERM GOAL #2   Title Patient will ambulate for 150 feet without LOB, with sufficient toe clearance to prevent loss of gait stability, and c proper AD placement with least-restrictive assistive device with supervision level of assist    Baseline 11/26/20: Gait x 40 feet with CGA.   12/23/20: CGA with dec toe clearance L>RLE.   01/15/21: Ambulated 160 feet without LOB, proper AD placement; good toe clearance with first 80 feet, but decrased L>R toe clearanc with fatigue.   02/10/21: Performed with FWW with good toe clearance with first 80 feet, decreasing L toe clearance with succesive steps.   03/17/21: Performed without LOB, no significant instability with bilateral UE support on front-wheel walker; decreased toe clearance during gait L>R.  04/23/21: Performed with no LOB, proper placement of FWW, and sufficient toe clearance to negotiate doorway threshold, sidewalk, and change in surface elevation.    Time 12    Period Weeks    Status Achieved    Target Date 04/10/21      PT LONG TERM GOAL #3   Title Patient will perform independent re-positioning in chair with use of closed-chain triceps on armrests as needed for independent wheelchair mobility with no verbal cueing or tactile cueing required    Baseline 11/26/20:  Dependent re-positioning in wheelchair.   12/23/20: Performed with feet on fold-out platform of her power wheelchair IND.    Time 8    Period Weeks    Status Achieved    Target Date 12/23/20      PT LONG TERM GOAL #4   Title Patient will  perform simulated step-over task for clearing doorway threshold with use of front-wheeled walker as needed for entering/exiting home with supervision level of assistance and no LOB    Baseline 11/26/20: Pt currently uses power wheelchair to negotiate threshold into home.   12/23/20: Deferred.  01/15/21: Deferred.   02/10/21: Performed last week with step-over single line on floor with negligible height.    03/17/21: Performed over doorway threshold in clinic today with intermittent CGA and no LOB.  04/23/21: Performed today with supervision level of assist.    Time 16    Period Weeks    Status Achieved    Target Date 04/10/21      PT LONG TERM GOAL #5   Title Pt will be able to independently perform supine to sit transfer to promote independence with bed mobility    Baseline 05/28/21 min A.  06/16/21: Supine to sit performed with MaxA for trunk to move from sidelying to sitting EOB    Time 6    Period Weeks    Status On-going    Target Date 07/08/21                   Plan - 06/17/21 1451     Clinical Impression Statement Patient has vastly improved ability to ambulate  over varying surfaces and change in surface elevation. Step-up onto small surface will be needed for car transfer into [employer] at home. Wheelchair-accessible [employer]i is still patient's primary means of getting to appointments and social outings in town. She has significantly improved distance she is able to ambulate with her FWW. She demonstrates no further progress with stand-pivot transfers relative to last progress note and has ongoing difficulties with supine to sit due to truncal weakness and L-sided upper and lower limb weakness. Pt is able to perform modified-independent rolling as needed for bed mobility at home with use of dowel to pull with her R upper limb. She has improved FOTO score and surpassed predicted FOTO score. Pt has made good progress, but is still significantly challenged with bed mobility tasks. Pt and her  spouse discussed expected plateau in function discussed with physician; informed patient that we will transition to home-based program and maintenance phase if she plateaus in measurable progress in PT. Pt will continue to benefit from skilled PT services to progress independence with functional mobility and reduced risk of falls.    Personal Factors and Comorbidities Age;Comorbidity 3+;Time since onset of injury/illness/exacerbation    Comorbidities GERD, pre-diabetes, obesity, gout, s/p C3-7 ACDF    Examination-Activity Limitations Bathing;Continence;Reach Overhead;Stairs;Bed Mobility;Dressing;Self Feeding;Stand;Hygiene/Grooming;Toileting;Transfers;Locomotion Level    Examination-Participation Restrictions Community Activity;Yard Work;Laundry;Cleaning;Shop    Stability/Clinical Decision Making Evolving/Moderate complexity    Rehab Potential Fair    PT Frequency Other (comment)   2-3x/week   PT Duration Other (comment)   16 weeks   PT Treatment/Interventions Electrical Stimulation;Gait training;Stair training;Functional mobility training;Therapeutic exercise;Balance training;Neuromuscular re-education;Wheelchair mobility training;Patient/family education;Therapeutic activities    PT Next Visit Plan *Re-certification next visit*  Gait re-training with FWW and facilitating improved independence with AD, task practice for transferring, LE strengthening, UE AROM and strengthening to assist with functional mobility and transfers.    PT Home Exercise  Plan Pt continuing with home health exercises. Pt continuing with task practice for rolling, bridging, supine to sit transfers in bed; walking with supervision/decreasing level fo assist with FWW; reaching and grasping with LUE. 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, initial encounter ([place])  Difficulty in walking, not elsewhere classified     Problem List Patient Active Problem List   Diagnosis Date Noted   Generalized anxiety disorder 01/24/2021   Suspected deep tissue injury 10/25/2020   Wheelchair dependence 11/27/2019   Spasticity 11/27/2019   Hyperkalemia    Steroid-induced hyperglycemia    Prediabetes    Neurogenic bowel    Neurogenic bladder    Neuropathic pain    Quadriplegia ([place]) 08/31/2019   S/P cervical spinal fusion 08/28/2019   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 [place], [place], [address] Phone: [phone]   Fax:  [phone]  Name: MAALLE STARRETT MRN: [id number] Date of Birth: Sep 03, 1940

## 2021-06-17 ENCOUNTER — Encounter: Payer: Self-pay | Admitting: Physical Therapy

## 2021-06-17 NOTE — Addendum Note (Signed)
Addended by: Valentina Gu T on: 06/17/2021 02:57 PM ? ? Modules accepted: Orders ? ?

## 2021-06-18 ENCOUNTER — Other Ambulatory Visit: Payer: Self-pay

## 2021-06-18 ENCOUNTER — Ambulatory Visit: Payer: Medicare PPO | Admitting: Physical Therapy

## 2021-06-18 DIAGNOSIS — R262 Difficulty in walking, not elsewhere classified: Secondary | ICD-10-CM

## 2021-06-18 DIAGNOSIS — M6281 Muscle weakness (generalized): Secondary | ICD-10-CM

## 2021-06-18 DIAGNOSIS — S14123A Central cord syndrome at C3 level of cervical spinal cord, initial encounter: Secondary | ICD-10-CM

## 2021-06-19 ENCOUNTER — Encounter: Payer: Self-pay | Admitting: Physical Therapy

## 2021-06-21 NOTE — Therapy (Signed)
[facility] [address] [place], [place], [address] Phone: [phone]   Fax:  [phone]  Physical Therapy Treatment  Patient Details  Name: Meredith Roach MRN: [id number] Date of Birth: 1940/07/04 Referring Provider (PT): [name], MD   Encounter Date: 06/18/2021   PT End of Session - 06/21/21 1228     Visit Number 65    Number of Visits 92    Date for PT Re-Evaluation 06/12/21    Authorization Time Period Cert 1/44/31-5/40/08, last progress note 04/23/21; current auth 16-Visits 05/26/21 - 07/16/21, progress note on 05/28/21    Authorization - Visit Number 6    Authorization - Number of Visits 16    Progress Note Due on Visit 61    PT Start Time 1149    PT Stop Time 1235    PT Time Calculation (min) 46 min    Equipment Utilized During Treatment Gait belt   Pt uses power wheelchair, clinic's front-wheeled walker utilized for gait   Activity Tolerance Patient tolerated treatment well    Behavior During Therapy WFL for tasks assessed/performed             Past Medical History:  Diagnosis Date   Arthritis    GERD (gastroesophageal reflux disease)    Gout    Neuromuscular disorder ([place])    Pre-diabetes     Past Surgical History:  Procedure Laterality Date   ANTERIOR CERVICAL DECOMPRESSION/DISCECTOMY FUSION 4 LEVELS N/A 08/28/2019   Procedure: ANTERIOR CERVICAL DECOMPRESSION/DISCECTOMY FUSION CERVICALTHREE-FOUR CERVICAL,FOUR-FIVE,CERVICAL FIVE-SIX,CERVICAL SIX-SEVEN.;  Surgeon: [name], MD;  Location: [facility];  Service: Neurosurgery;  Laterality: N/A;  ANTERIOR   BACK SURGERY     CATARACT EXTRACTION W/PHACO Right 03/31/2018   Procedure: CATARACT EXTRACTION PHACO AND INTRAOCULAR LENS PLACEMENT ([place]);  Surgeon: [name], MD;  Location: [facility];  Service: Ophthalmology;  Laterality: Right;  [place] 00:39.7 CDE 4.35 Fluid Pack Lot # [id number]   CHOLECYSTECTOMY  1995   COLONOSCOPY WITH PROPOFOL N/A 06/07/2017    Procedure: COLONOSCOPY WITH PROPOFOL;  Surgeon: [name], MD;  Location[facility];  Service: Endoscopy;  Laterality: N/A;   ESOPHAGOGASTRODUODENOSCOPY (EGD) WITH PROPOFOL N/A 06/07/2017   Procedure: ESOPHAGOGASTRODUODENOSCOPY (EGD) WITH PROPOFOL;  Surgeon: [name], MD;  Location: [facility] ENDOSCOPY;  Service: Endoscopy;  Laterality: N/A;   IR CATHETER TUBE CHANGE  05/10/2020   REDUCTION MAMMAPLASTY Bilateral 1994    There were no vitals filed for this visit.   Subjective Assessment - 06/21/21 1228     Subjective Patient reports ongoing LUE tightness that varies in intensity each day. She reports no new complaints at arrival to PT. She and her husband/family have not had opportunity to attempt transfer into passenger's seat of [employer] yet. She feels she has progressd well with gait and ability to perform transfers.    Patient is accompained by: Family member   husband   Pertinent History Patient is a 81 year old female s/p incomplete SCI. She had incomplete SCI following fall - C4 [place] D (DOI: 08/26/19). Primary activity limitations c difficulty walking, transferring, and completing independent functional mobility following incomplete SCI. She has undergone home health therapy for about one year. She and her husband reports she was able to transfer and ambulate with walker in home with PT walking beside her. Pt has practiced with front-wheel walker at home. 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 Patient reports using steady transfer aid for performing transfers chair to bed and W/C  to standard chair. Her husband states that she is able to stand with steady device and can stand with her legs against the bed. Pt has ramp to get in her home for her power wheelchair. Patient lives in one level home. Pt uses tub bench that she can utilize to slide into shower; her daughter/husband assists with bathing. Pt has [employer]i that can accommodate her power chair at this time. Pt has  concrete driveway to get up to her ramp. Pt has small threshold to get into her home - husband utilizes cardboard to get over threshold.    Limitations Walking;Standing;House hold activities;Other (comment)   transferring, bed mobility, bladder management   Patient Stated Goals to be able to walk more independently             TREATMENT     Patient independently managed components of wheelchair to initiate session: doffing safety belt and moving hinged footrest. ModA required with donning gait belt due to decreased motor control and spasticity in L upper limb. Pt able to independently manage catheter drainage bag once removed from hook under power chair seat        Therapeutic Activities:   Ambulate laps in gym x 2 with PT on patient's L side with intermittent contact guard; verbal cueing for foot clearance as needed   Bed mobility task practice: bilateral rolling R and L x 2;  Trunk lateral flexion against gravity performed on each side 2x10; Sidelying clamshell (unable to perform complete lift/hip ABD against gravity) 2x12 on each side; Sitting to supine and supine to sit performed x 1       Neuro Re-Ed:    Reaching and grasping task using varying-size balls; moving from on tray to adjacent tray on high table; x 5 minutes   Seated theraband chest press to imitate pushing open doors: Blue Tband; 2x10 requiring loop tied for success holding with LUE due to spasticity in hand and limited grip strength.     Standing and seated rest breaks required intermittently throughout session due to motor fatigue.      Pt independently transferred stand to sit in power chair at end of session, requires assistance for buckling seatbelt at end of session and placement of indwelling foley catheter onto hook. Pt IND with footrest placement        *not today* In // bars:  RW amb step up and over x2 airex foam pads in // bars. X3 leading with RLE onto airex pad then x3 leading with LLE  requiring CGA only for foot clearance.     Ambulation through doorway threshold and along outside sidewalk with FWW, around perimeter of building including walking along curb cuts x 4, incline/decline, and grassy terrain. Pt walked to bench adjacent to front door of office; x 15 minutes, with CGA during obstacle negotiation and close supervision only on even ground. Pt maintaining upright stance while negotiating varying incline/decline to challenge trunk control  Amb with RW along floor ladder alternating cone taps for improving hip clearance. CGA provided. Pt unable to tap top of cone but is < 1" away from being able to perform. X2 laps.                  ASSESSMENT Patient is able to perform lateral trunk flexion in sidelying on either side. She is able to move lower limbs over edge of bed and push into lying on L elbow; however, she is able to achieve full sitting position from there. She  demonstrates improving motor control of L upper limb as demonstrated by completing spherical grip with varying size, texture, and weight of objects with cross-body reaching with L upper limb. Patient demonstrates markedly improved gait pattern over the last month; she does still need significant work on bed mobility. Pt will continue to benefit from skilled PT services to progress independence with functional mobility and reduced risk of falls.         PT Short Term Goals - 06/16/21 1155       PT SHORT TERM GOAL #1   Title Patient will perform stand-pivot transfer from chair to bed with supervision level of assist and ModI setup of power chair as needed for transferring at home    Baseline 11/26/20: Max assist for setup and mod assist to perform sit to stand, unsteady pivot prior to performing stand to sit.    12/23/20: Patient is able to setup power chair for transfer ModI. Mod to Max assist for sit to stand. CGA level of assist for standing pivot and stand to sit on low mat.   01/15/21: Setup of power chair  without assist; modified independent pivot and stand-to-sit; ModA for initial set to stand prior to pivot.  02/10/21: ModI setup of power chair, ModA for initiation of sit to stand, modified-independent stand pivot transfer.     03/17/21: Performed with CGA during sit to stand only and supervision level of assist for remainder of stand-pivot transfer (performing from power chair to table).   04/23/21: Pt performed stand-pivot transfer from power chair to low mat with CGA only needed during sit to stand to guard posteriorly.  06/16/21: Stand-pivot transfer from wheelchair to low mat performed with MinA required briefly to prevent posterior fall, remainder of pivot and sitting onto low mat was performed modified-independent    Time 4    Period Weeks    Status Partially Met    Target Date 07/15/21      PT SHORT TERM GOAL #2   Title Patient will perform standing toe tap to 3-inch surface (Airex pad) with bilateral upper extremity support indicative of improved ability to clear feet from floor during stepping and improved weight shift to each LE with AD use    Baseline 11/26/20: Limited toe clearance during bilateral LE swing phase with front-wheeled walker.   12/23/20: performed at previous follow-up with bilateral UE support.    Time 3    Period Weeks    Status Achieved    Target Date 12/17/20               PT Long Term Goals - 06/16/21 1153       PT LONG TERM GOAL #1   Title Patient will demonstrate improved function as evidenced by a score of 35 on FOTO measure for full participation in activities at home and in the community.    Baseline 11/26/20: FOTO 12.   12/23/20: FOTO 24.    01/15/21: FOTO 23.   02/10/21: FOTO 38.   03/17/21: 49.   04/23/21: 30. 05/28/21: 27.  06/16/21: 47.    Time 16    Period Weeks    Status Achieved   previously had score > 35   Target Date 06/25/21      PT LONG TERM GOAL #2   Title Patient will ambulate for 150 feet without LOB, with sufficient toe clearance to prevent  loss of gait stability, and c proper AD placement with least-restrictive assistive device with supervision level of assist  Baseline 11/26/20: Gait x 40 feet with CGA.   12/23/20: CGA with dec toe clearance L>RLE.   01/15/21: Ambulated 160 feet without LOB, proper AD placement; good toe clearance with first 80 feet, but decrased L>R toe clearanc with fatigue.   02/10/21: Performed with FWW with good toe clearance with first 80 feet, decreasing L toe clearance with succesive steps.   03/17/21: Performed without LOB, no significant instability with bilateral UE support on front-wheel walker; decreased toe clearance during gait L>R.  04/23/21: Performed with no LOB, proper placement of FWW, and sufficient toe clearance to negotiate doorway threshold, sidewalk, and change in surface elevation.    Time 12    Period Weeks    Status Achieved    Target Date 04/10/21      PT LONG TERM GOAL #3   Title Patient will perform independent re-positioning in chair with use of closed-chain triceps on armrests as needed for independent wheelchair mobility with no verbal cueing or tactile cueing required    Baseline 11/26/20: Dependent re-positioning in wheelchair.   12/23/20: Performed with feet on fold-out platform of her power wheelchair IND.    Time 8    Period Weeks    Status Achieved    Target Date 12/23/20      PT LONG TERM GOAL #4   Title Patient will perform simulated step-over task for clearing doorway threshold with use of front-wheeled walker as needed for entering/exiting home with supervision level of assistance and no LOB    Baseline 11/26/20: Pt currently uses power wheelchair to negotiate threshold into home.   12/23/20: Deferred.  01/15/21: Deferred.   02/10/21: Performed last week with step-over single line on floor with negligible height.    03/17/21: Performed over doorway threshold in clinic today with intermittent CGA and no LOB.  04/23/21: Performed today with supervision level of assist.    Time 16     Period Weeks    Status Achieved    Target Date 04/10/21      PT LONG TERM GOAL #5   Title Pt will be able to independently perform supine to sit transfer to promote independence with bed mobility    Baseline 05/28/21 min A.  06/16/21: Supine to sit performed with MaxA for trunk to move from sidelying to sitting EOB    Time 6    Period Weeks    Status On-going    Target Date 07/08/21                   Plan - 06/21/21 1422     Clinical Impression Statement Patient is able to perform lateral trunk flexion in sidelying on either side. She is able to move lower limbs over edge of bed and push into lying on L elbow; however, she is able to achieve full sitting position from there. She demonstrates improving motor control of L upper limb as demonstrated by completing spherical grip with varying size, texture, and weight of objects with cross-body reaching with L upper limb. Patient demonstrates markedly improved gait pattern over the last month; she does still need significant work on bed mobility. Pt will continue to benefit from skilled PT services to progress independence with functional mobility and reduced risk of falls.    Personal Factors and Comorbidities Age;Comorbidity 3+;Time since onset of injury/illness/exacerbation    Comorbidities GERD, pre-diabetes, obesity, gout, s/p C3-7 ACDF    Examination-Activity Limitations Bathing;Continence;Reach Overhead;Stairs;Bed Mobility;Dressing;Self Feeding;Stand;Hygiene/Grooming;Toileting;Transfers;Locomotion Level    Examination-Participation Restrictions Community Activity;Yard Work;Laundry;Cleaning;Shop  Stability/Clinical Decision Making Evolving/Moderate complexity    Rehab Potential Fair    PT Frequency Other (comment)   2-3x/week   PT Duration Other (comment)   16 weeks   PT Treatment/Interventions Electrical Stimulation;Gait training;Stair training;Functional mobility training;Therapeutic exercise;Balance training;Neuromuscular  re-education;Wheelchair mobility training;Patient/family education;Therapeutic activities    PT Next Visit Plan Gait re-training with FWW and facilitating improved independence with AD, task practice for transferring, LE strengthening, UE AROM and strengthening to assist with functional mobility and transfers.    PT Home Exercise Plan Pt continuing with home health exercises. Pt continuing with task practice for rolling, bridging, supine to sit transfers in bed; walking with supervision/decreasing level fo assist with FWW; reaching and grasping with LUE. 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, initial encounter ([place])  Difficulty in walking, not elsewhere classified     Problem List Patient Active Problem List   Diagnosis Date Noted   Generalized anxiety disorder 01/24/2021   Suspected deep tissue injury 10/25/2020   Wheelchair dependence 11/27/2019   Spasticity 11/27/2019   Hyperkalemia    Steroid-induced hyperglycemia    Prediabetes    Neurogenic bowel    Neurogenic bladder    Neuropathic pain    Quadriplegia ([place]) 08/31/2019   S/P cervical spinal fusion 08/28/2019   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 [place], [place], [address] Phone: [phone]   Fax:  [phone]  Name: Meredith Roach MRN: [id number] Date  of Birth: Jun 30, 1940

## 2021-06-23 ENCOUNTER — Ambulatory Visit: Payer: Medicare PPO | Admitting: Physical Therapy

## 2021-06-23 ENCOUNTER — Other Ambulatory Visit: Payer: Self-pay

## 2021-06-23 DIAGNOSIS — S14123A Central cord syndrome at C3 level of cervical spinal cord, initial encounter: Secondary | ICD-10-CM

## 2021-06-23 DIAGNOSIS — R262 Difficulty in walking, not elsewhere classified: Secondary | ICD-10-CM

## 2021-06-23 DIAGNOSIS — M6281 Muscle weakness (generalized): Secondary | ICD-10-CM

## 2021-06-23 NOTE — Therapy (Incomplete)
[facility] [address] [place], [place], [address] Phone: [phone]   Fax:  [phone]  Physical Therapy Treatment  Patient Details  Name: Meredith Roach MRN: [id number] Date of Birth: 1940-10-13 Referring Provider (PT): [name], MD   Encounter Date: 06/23/2021    Past Medical History:  Diagnosis Date   Arthritis    GERD (gastroesophageal reflux disease)    Gout    Neuromuscular disorder ([place])    Pre-diabetes     Past Surgical History:  Procedure Laterality Date   ANTERIOR CERVICAL DECOMPRESSION/DISCECTOMY FUSION 4 LEVELS N/A 08/28/2019   Procedure: ANTERIOR CERVICAL DECOMPRESSION/DISCECTOMY FUSION CERVICALTHREE-FOUR CERVICAL,FOUR-FIVE,CERVICAL FIVE-SIX,CERVICAL SIX-SEVEN.;  Surgeon: [name], MD;  Location: [facility];  Service: Neurosurgery;  Laterality: N/A;  ANTERIOR   BACK SURGERY     CATARACT EXTRACTION W/PHACO Right 03/31/2018   Procedure: CATARACT EXTRACTION PHACO AND INTRAOCULAR LENS PLACEMENT ([place]);  Surgeon: [name], MD;  Location: [facility];  Service: Ophthalmology;  Laterality: Right;  [place] 00:39.7 CDE 4.35 Fluid Pack Lot # [id number]   CHOLECYSTECTOMY  1995   COLONOSCOPY WITH PROPOFOL N/A 06/07/2017   Procedure: COLONOSCOPY WITH PROPOFOL;  Surgeon: [name], MD;  Location: [facility] ENDOSCOPY;  Service: Endoscopy;  Laterality: N/A;   ESOPHAGOGASTRODUODENOSCOPY (EGD) WITH PROPOFOL N/A 06/07/2017   Procedure: ESOPHAGOGASTRODUODENOSCOPY (EGD) WITH PROPOFOL;  Surgeon: [name], MD;  Location: [facility] ENDOSCOPY;  Service: Endoscopy;  Laterality: N/A;   IR CATHETER TUBE CHANGE  05/10/2020   REDUCTION MAMMAPLASTY Bilateral 1994    There were no vitals filed for this visit.                                 TREATMENT     Patient independently managed components of wheelchair to initiate session: doffing safety belt and moving hinged footrest. ModA required with donning  gait belt due to decreased motor control and spasticity in L upper limb. Pt able to independently manage catheter drainage bag once removed from hook under power chair seat        Therapeutic Activities:   Ambulate laps in gym x 2 with PT on patient's L side with intermittent contact guard; verbal cueing for foot clearance as needed   Bed mobility task practice: bilateral rolling R and L x 2;  Trunk lateral flexion against gravity performed on each side 2x10; Sidelying clamshell (unable to perform complete lift/hip ABD against gravity) 2x12 on each side; Sitting to supine and supine to sit performed x 1       Neuro Re-Ed:    Reaching and grasping task using varying-size balls; moving from on tray to adjacent tray on high table; x 5 minutes   Seated theraband chest press to imitate pushing open doors: Blue Tband; 2x10 requiring loop tied for success holding with LUE due to spasticity in hand and limited grip strength.      Standing and seated rest breaks required intermittently throughout session due to motor fatigue.      Pt independently transferred stand to sit in power chair at end of session, requires assistance for buckling seatbelt at end of session and placement of indwelling foley catheter onto hook. Pt IND with footrest placement         *not today* In // bars:  RW amb step up and over x2 airex foam pads in // bars. X3 leading with RLE onto airex pad then x3 leading with  LLE requiring CGA only for foot clearance.     Ambulation through doorway threshold and along outside sidewalk with FWW, around perimeter of building including walking along curb cuts x 4, incline/decline, and grassy terrain. Pt walked to bench adjacent to front door of office; x 15 minutes, with CGA during obstacle negotiation and close supervision only on even ground. Pt maintaining upright stance while negotiating varying incline/decline to challenge trunk control  Amb with RW along floor ladder alternating  cone taps for improving hip clearance. CGA provided. Pt unable to tap top of cone but is < 1" away from being able to perform. X2 laps.                  ASSESSMENT Patient is able to perform lateral trunk flexion in sidelying on either side. She is able to move lower limbs over edge of bed and push into lying on L elbow; however, she is able to achieve full sitting position from there. She demonstrates improving motor control of L upper limb as demonstrated by completing spherical grip with varying size, texture, and weight of objects with cross-body reaching with L upper limb. Patient demonstrates markedly improved gait pattern over the last month; she does still need significant work on bed mobility. Pt will continue to benefit from skilled PT services to progress independence with functional mobility and reduced risk of falls.            PT Short Term Goals - 06/16/21 1155       PT SHORT TERM GOAL #1   Title Patient will perform stand-pivot transfer from chair to bed with supervision level of assist and ModI setup of power chair as needed for transferring at home    Baseline 11/26/20: Max assist for setup and mod assist to perform sit to stand, unsteady pivot prior to performing stand to sit.    12/23/20: Patient is able to setup power chair for transfer ModI. Mod to Max assist for sit to stand. CGA level of assist for standing pivot and stand to sit on low mat.   01/15/21: Setup of power chair without assist; modified independent pivot and stand-to-sit; ModA for initial set to stand prior to pivot.  02/10/21: ModI setup of power chair, ModA for initiation of sit to stand, modified-independent stand pivot transfer.     03/17/21: Performed with CGA during sit to stand only and supervision level of assist for remainder of stand-pivot transfer (performing from power chair to table).   04/23/21: Pt performed stand-pivot transfer from power chair to low mat with CGA only needed during sit to stand to  guard posteriorly.  06/16/21: Stand-pivot transfer from wheelchair to low mat performed with MinA required briefly to prevent posterior fall, remainder of pivot and sitting onto low mat was performed modified-independent    Time 4    Period Weeks    Status Partially Met    Target Date 07/15/21      PT SHORT TERM GOAL #2   Title Patient will perform standing toe tap to 3-inch surface (Airex pad) with bilateral upper extremity support indicative of improved ability to clear feet from floor during stepping and improved weight shift to each LE with AD use    Baseline 11/26/20: Limited toe clearance during bilateral LE swing phase with front-wheeled walker.   12/23/20: performed at previous follow-up with bilateral UE support.    Time 3    Period Weeks    Status Achieved    Target Date  12/17/20               PT Long Term Goals - 06/16/21 1153       PT LONG TERM GOAL #1   Title Patient will demonstrate improved function as evidenced by a score of 35 on FOTO measure for full participation in activities at home and in the community.    Baseline 11/26/20: FOTO 12.   12/23/20: FOTO 24.    01/15/21: FOTO 23.   02/10/21: FOTO 38.   03/17/21: 49.   04/23/21: 30. 05/28/21: 27.  06/16/21: 47.    Time 16    Period Weeks    Status Achieved   previously had score > 35   Target Date 06/25/21      PT LONG TERM GOAL #2   Title Patient will ambulate for 150 feet without LOB, with sufficient toe clearance to prevent loss of gait stability, and c proper AD placement with least-restrictive assistive device with supervision level of assist    Baseline 11/26/20: Gait x 40 feet with CGA.   12/23/20: CGA with dec toe clearance L>RLE.   01/15/21: Ambulated 160 feet without LOB, proper AD placement; good toe clearance with first 80 feet, but decrased L>R toe clearanc with fatigue.   02/10/21: Performed with FWW with good toe clearance with first 80 feet, decreasing L toe clearance with succesive steps.   03/17/21: Performed  without LOB, no significant instability with bilateral UE support on front-wheel walker; decreased toe clearance during gait L>R.  04/23/21: Performed with no LOB, proper placement of FWW, and sufficient toe clearance to negotiate doorway threshold, sidewalk, and change in surface elevation.    Time 12    Period Weeks    Status Achieved    Target Date 04/10/21      PT LONG TERM GOAL #3   Title Patient will perform independent re-positioning in chair with use of closed-chain triceps on armrests as needed for independent wheelchair mobility with no verbal cueing or tactile cueing required    Baseline 11/26/20: Dependent re-positioning in wheelchair.   12/23/20: Performed with feet on fold-out platform of her power wheelchair IND.    Time 8    Period Weeks    Status Achieved    Target Date 12/23/20      PT LONG TERM GOAL #4   Title Patient will perform simulated step-over task for clearing doorway threshold with use of front-wheeled walker as needed for entering/exiting home with supervision level of assistance and no LOB    Baseline 11/26/20: Pt currently uses power wheelchair to negotiate threshold into home.   12/23/20: Deferred.  01/15/21: Deferred.   02/10/21: Performed last week with step-over single line on floor with negligible height.    03/17/21: Performed over doorway threshold in clinic today with intermittent CGA and no LOB.  04/23/21: Performed today with supervision level of assist.    Time 16    Period Weeks    Status Achieved    Target Date 04/10/21      PT LONG TERM GOAL #5   Title Pt will be able to independently perform supine to sit transfer to promote independence with bed mobility    Baseline 05/28/21 min A.  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, initial encounter ([place])  Difficulty in walking, not elsewhere classified     Problem List Patient Active Problem List   Diagnosis Date Noted   Generalized anxiety disorder 01/24/2021   Suspected deep tissue injury 10/25/2020   Wheelchair dependence 11/27/2019   Spasticity 11/27/2019   Hyperkalemia    Steroid-induced hyperglycemia    Prediabetes    Neurogenic bowel    Neurogenic bladder    Neuropathic pain    Quadriplegia ([place]) 08/31/2019   S/P cervical spinal fusion 08/28/2019   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 [address] [place], [place], [address] Phone: [phone]   Fax:  [phone]  Name: Meredith Roach MRN: [id number] Date of Birth: 12-07-1940

## 2021-06-24 ENCOUNTER — Encounter: Payer: Self-pay | Admitting: Physical Therapy

## 2021-06-25 ENCOUNTER — Other Ambulatory Visit: Payer: Self-pay

## 2021-06-25 ENCOUNTER — Ambulatory Visit: Payer: Medicare PPO | Admitting: Physical Therapy

## 2021-06-25 DIAGNOSIS — R262 Difficulty in walking, not elsewhere classified: Secondary | ICD-10-CM

## 2021-06-25 DIAGNOSIS — M6281 Muscle weakness (generalized): Secondary | ICD-10-CM

## 2021-06-25 DIAGNOSIS — S14123A Central cord syndrome at C3 level of cervical spinal cord, initial encounter: Secondary | ICD-10-CM

## 2021-06-25 NOTE — Therapy (Signed)
[place] [facility] ?[address] [name], [place], [address] ?Phone: [phone]   Fax:  [phone] ? ?Physical Therapy Treatment ? ?Patient Details  ?Name: Meredith Roach ?MRN: [id number] ?Date of Birth: 22-Jan-1941 ?Referring Provider (PT): [name], MD ? ? ?Encounter Date: 06/25/2021 ? ? PT End of Session - 06/28/21 2031   ? ? Visit Number 43   ? Number of Visits 92   ? Date for PT Re-Evaluation 06/12/21   ? Authorization Time Period Cert 0/96/28-3/66/29, last progress note 04/23/21; current auth 16-Visits 05/26/21 - 07/16/21, progress note on 05/28/21   ? Authorization - Visit Number 9   ? Authorization - Number of Visits 16   ? Progress Note Due on Visit 60   ? PT Start Time 1148   ? PT Stop Time 1235   ? PT Time Calculation (min) 47 min   ? Equipment Utilized During Treatment Gait belt   Pt uses power wheelchair, clinic's front-wheeled walker utilized for gait  ? Activity Tolerance Patient tolerated treatment well   ? Behavior During Therapy [facility] for tasks assessed/performed   ? ?  ?  ? ?  ? ? ?Past Medical History:  ?Diagnosis Date  ? Arthritis   ? GERD (gastroesophageal reflux disease)   ? Gout   ? Neuromuscular disorder ([place])   ? Pre-diabetes   ? ? ?Past Surgical History:  ?Procedure Laterality Date  ? ANTERIOR CERVICAL DECOMPRESSION/DISCECTOMY FUSION 4 LEVELS N/A 08/28/2019  ? Procedure: ANTERIOR CERVICAL DECOMPRESSION/DISCECTOMY FUSION CERVICALTHREE-FOUR CERVICAL,FOUR-FIVE,CERVICAL FIVE-SIX,CERVICAL SIX-SEVEN.;  Surgeon: [name], MD;  Location: [place];  Service: Neurosurgery;  Laterality: N/A;  ANTERIOR  ? BACK SURGERY    ? CATARACT EXTRACTION W/PHACO Right 03/31/2018  ? Procedure: CATARACT EXTRACTION PHACO AND INTRAOCULAR LENS PLACEMENT ([facility]);  Surgeon: [name], MD;  Location: [facility] ORS;  Service: Ophthalmology;  Laterality: Right;  [place] 00:39.7 ?CDE 4.35 ?Fluid Pack Lot # [id number] H  ? CHOLECYSTECTOMY  1995  ? COLONOSCOPY WITH PROPOFOL N/A 06/07/2017  ?  Procedure: COLONOSCOPY WITH PROPOFOL;  Surgeon: [name], MD;  Location: [facility] ENDOSCOPY;  Service: Endoscopy;  Laterality: N/A;  ? ESOPHAGOGASTRODUODENOSCOPY (EGD) WITH PROPOFOL N/A 06/07/2017  ? Procedure: ESOPHAGOGASTRODUODENOSCOPY (EGD) WITH PROPOFOL;  Surgeon: [name], MD;  [facility];  Service: Endoscopy;  Laterality: N/A;  ? IR CATHETER TUBE CHANGE  05/10/2020  ? REDUCTION MAMMAPLASTY Bilateral 1994  ? ? ?There were no vitals filed for this visit. ? ? Subjective Assessment - 06/28/21 2031   ? ? Subjective Patient reports feeling generally well at arrival to PT. She reports improved independence with transfer from her wheelchair to her Stedy and to adjacent chair/bed. She reports no other major changes at arrival to PT.   ? Patient is accompained by: Family member   husband  ? 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 She had incomplete SCI following fall - C4 [place] D (DOI: 08/26/19). Primary activity limitations c difficulty walking, transferring, and completing independent functional mobility following incomplete SCI. She has undergone home health therapy for about one year. She and her husband reports she was able to transfer and ambulate with walker in home with PT walking beside her. Pt has practiced with front-wheel walker at home. 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 Patient reports using steady transfer aid for performing transfers chair to bed and W/C to standard chair. Her husband states that she is able to stand with steady device and  can stand with her legs against the bed. Pt has ramp to get in her home for her power wheelchair. Patient lives in one level home. Pt uses tub bench that she can utilize to slide into shower; her daughter/husband assists with bathing. Pt has Lucianne Lei that can accommodate her power chair at this time. Pt has concrete driveway to get up to her ramp. Pt has small threshold to get into her home - husband  utilizes cardboard to get over threshold.   ? Limitations Walking;Standing;House hold activities;Other (comment)   transferring, bed mobility, bladder management  ? Patient Stated Goals to be able to walk more independently   ? ?  ?  ? ?  ? ? ? ? ? ? ?TREATMENT ?  ?  ?Patient independently managed components of wheelchair to initiate session: doffing safety belt and moving hinged footrest. ModA required with donning gait belt due to decreased motor control and spasticity in L upper limb. Pt able to independently manage catheter drainage bag once removed from hook under power chair seat  ?  ?  ?  ?Therapeutic Activities: ?  ?Ambulate laps in gym x 2 with PT on patient's L side with intermittent contact guard; verbal cueing for foot clearance as needed ?  ?Bed mobility task practice: long-sitting in reclined position on wedge, contralateral reach and trunk-lift; 1x12 and 1x10 ea dir, alternating R and L ?  ?  ?  ?Neuro Re-Ed:  ?  ?Reaching and grasping task using varying-size balls; moving from on tray to adjacent tray on high table; x 5 minutes ?  ?Standing theraband chest press to imitate pushing open doors (performed with low mat posterior to patient with pt standing in walker): Green Tband; 2x10 requiring loop tied for success holding with LUE due to spasticity in hand and limited grip strength, bilateral UE push with band looped around mid-back ?  ?Standing lateral step over Tband laying flat on floor for step-length and LE ER/ABD required for bed and car transfers; 2x10 alternating L/R over Tband on floor ?  ?  ?  ?Standing and seated rest breaks required intermittently throughout session due to motor fatigue.  ?  ?Pt independently transferred stand to sit in power chair at end of session, requires assistance for buckling seatbelt at end of session and placement of indwelling foley catheter onto hook. Pt IND with footrest placement ?  ?  ?  ?  ?*not today*   ?In // bars:  ?RW amb step up and over x3 airex foam  pads in // bars; D/B x 1, on second attempted set, stopped at second Airex pad on return trip due to safety concern with LLE motor fatigue and significant difficulty attaining toe-off with ModA and careful guarding from therapist needed to move L lower limb from Airex pad. CGA only during other attempts. ?  Ambulation through doorway threshold and along outside sidewalk with FWW, around perimeter of building including walking along curb cuts x 4, incline/decline, and grassy terrain. Pt walked to bench adjacent to front door of office; x 15 minutes, with CGA during obstacle negotiation and close supervision only on even ground. Pt maintaining upright stance while negotiating varying incline/decline to challenge trunk control ? Amb with RW along floor ladder alternating cone taps for improving hip clearance. CGA provided. Pt unable to tap top of cone but is < 1" away from being able to perform. X2 laps.  ?        ?  ?  ?  ?  ?  ASSESSMENT ?Patient has remaining difficulty with supine to sit transfers - able to complete supine to sit transfer with decreased assistance from reclined (on wedge) versus supine position.  She is still significantly challenged with performing active trunk flexion and rotation required for supine to sit and she is limited in lower extremity management given L-sided paresis and decreased trunk control. Patient demonstrates improving ability to perform lower limb lifting and active hip abduction simulating demands of lower limb management during bed and car transfers. Pt will continue to benefit from skilled PT services to progress independence with functional mobility and reduced risk of falls.  ?   ? ? ? ? ? PT Short Term Goals - 06/16/21 1155   ? ?  ? PT SHORT TERM GOAL #1  ? Title Patient will perform stand-pivot transfer from chair to bed with supervision level of assist and ModI setup of power chair as needed for transferring at home   ? Baseline 11/26/20: Max assist for setup and mod assist  to perform sit to stand, unsteady pivot prior to performing stand to sit.    12/23/20: Patient is able to setup power chair for transfer ModI. Mod to Max assist for sit to stand. CGA level of assist for stan

## 2021-06-28 ENCOUNTER — Encounter: Payer: Self-pay | Admitting: Physical Therapy

## 2021-06-29 NOTE — Progress Notes (Signed)
Cath Change/ Replacement ? ?Patient is present today for a catheter change due to urinary retention.  9 ml of water was removed from the balloon, a 16 FR foley cath was removed with out difficulty.  Patient was cleaned and prepped in a sterile fashion with betadine. A 16 FR foley cath was replaced into the bladder no complications were noted Urine return was noted 10 ml and urine was yellow/clear in color. The balloon was filled with 40m of sterile water.  A night bag was also given to the patient and patient was given instruction on how to change from one bag to another. Patient was given proper instruction on catheter care.  Showed patient how to irrigate catheter for sediment and gave them the vinegar recipe at checkout ? ?Performed by: SZara Council PA-C  ? ?Follow up: Return in about 1 month (around 07/31/2021) for SPT exchange . ? ? ?I,Kailey Littlejohn,acting as a sEducation administratorfor [employer] PA-C.,have documented all relevant documentation on the behalf of SM[place] PA-C,as directed by  [facility] PA-C while in the presence of Vincenzo Stave, PA-C. ?

## 2021-06-30 ENCOUNTER — Encounter: Payer: Self-pay | Admitting: Physical Therapy

## 2021-06-30 ENCOUNTER — Ambulatory Visit: Payer: Medicare PPO | Admitting: Physical Therapy

## 2021-06-30 ENCOUNTER — Encounter: Payer: Self-pay | Admitting: Urology

## 2021-06-30 ENCOUNTER — Other Ambulatory Visit: Payer: Self-pay

## 2021-06-30 ENCOUNTER — Ambulatory Visit: Payer: Medicare PPO | Admitting: Urology

## 2021-06-30 VITALS — Ht 63.0 in | Wt 200.0 lb

## 2021-06-30 DIAGNOSIS — M6281 Muscle weakness (generalized): Secondary | ICD-10-CM

## 2021-06-30 DIAGNOSIS — S14123A Central cord syndrome at C3 level of cervical spinal cord, initial encounter: Secondary | ICD-10-CM

## 2021-06-30 DIAGNOSIS — R262 Difficulty in walking, not elsewhere classified: Secondary | ICD-10-CM

## 2021-06-30 DIAGNOSIS — Z9359 Other cystostomy status: Secondary | ICD-10-CM

## 2021-06-30 NOTE — Therapy (Signed)
[place] ?[facility] ?[address] [name], [place], [address] ?Phone: [phone]   Fax:  [phone] ? ?Physical Therapy Treatment ? ?Patient Details  ?Name: Meredith Roach ?MRN: [id number] ?Date of Birth: [birth year] ?Referring Provider (PT): [name], MD ? ? ?Encounter Date: 06/30/2021 ? ? PT End of Session - 07/02/21 0272   ? ? Visit Number 81   ? Number of Visits 92   ? Date for PT Re-Evaluation 06/12/21   ? Authorization Time Period Cert 5/36/64-07/14/45, last progress note 04/23/21; current auth 16-Visits 05/26/21 - 07/16/21, progress note on 05/28/21   ? Authorization - Visit Number 9   ? Authorization - Number of Visits 16   ? Progress Note Due on Visit 60   ? PT Start Time 1149   ? PT Stop Time 1236   ? PT Time Calculation (min) 47 min   ? Equipment Utilized During Treatment Gait belt   Pt uses power wheelchair, clinic's front-wheeled walker utilized for gait  ? Activity Tolerance Patient tolerated treatment well   ? Behavior During Therapy [facility] for tasks assessed/performed   ? ?  ?  ? ?  ? ? ?Past Medical History:  ?Diagnosis Date  ? Arthritis   ? GERD (gastroesophageal reflux disease)   ? Gout   ? Neuromuscular disorder ([place])   ? Pre-diabetes   ? ? ?Past Surgical History:  ?Procedure Laterality Date  ? ANTERIOR CERVICAL DECOMPRESSION/DISCECTOMY FUSION 4 LEVELS N/A 08/28/2019  ? Procedure: ANTERIOR CERVICAL DECOMPRESSION/DISCECTOMY FUSION CERVICALTHREE-FOUR CERVICAL,FOUR-FIVE,CERVICAL FIVE-SIX,CERVICAL SIX-SEVEN.;  Surgeon: [name], MD;  Location: [facility];  Service: Neurosurgery;  Laterality: N/A;  ANTERIOR  ? BACK SURGERY    ? CATARACT EXTRACTION W/PHACO Right 03/31/2018  ? Procedure: CATARACT EXTRACTION PHACO AND INTRAOCULAR LENS PLACEMENT (IOC);  Surgeon: [name], MD;  Location: [facility];  Service: Ophthalmology;  Laterality: Right;  [place] 00:39.7 ?CDE 4.35 ?Fluid Pack Lot # [id number]  ? CHOLECYSTECTOMY  1995  ? COLONOSCOPY WITH PROPOFOL N/A 06/07/2017  ?  Procedure: COLONOSCOPY WITH PROPOFOL;  Surgeon: [name], MD;  Location: [facility]COPY;  Service: Endoscopy;  Laterality: N/A;  ? ESOPHAGOGASTRODUODENOSCOPY (EGD) WITH PROPOFOL N/A 06/07/2017  ? Procedure: ESOPHAGOGASTRODUODENOSCOPY (EGD) WITH PROPOFOL;  Surgeon: [name], MD;  Location: [facility];  Service: Endoscopy;  Laterality: N/A;  ? IR CATHETER TUBE CHANGE  05/10/2020  ? REDUCTION MAMMAPLASTY Bilateral 1994  ? ? ?There were no vitals filed for this visit. ? ? Subjective Assessment - 07/02/21 0633   ? ? Subjective Patient reports some L ankle tightness at arrival to PT. She reports no other major complaints at arrival. Patient reports doing well with using stick/external device for rolling and supine to sit. Patient reports she can get onto her elbow in sidelying position, but has difficuly getting fully up into sitting from there.   ? Patient is accompained by: Family member   husband  ? 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 She had incomplete SCI following fall - C4 [place] D (DOI: 08/26/19). Primary activity limitations c difficulty walking, transferring, and completing independent functional mobility following incomplete SCI. She has undergone home health therapy for about one year. She and her husband reports she was able to transfer and ambulate with walker in home with PT walking beside her. Pt has practiced with front-wheel walker at home. 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 Patient reports using steady transfer aid for performing transfers chair to bed  and W/C to standard chair. Her husband states that she is able to stand with steady device and can stand with her legs against the bed. Pt has ramp to get in her home for her power wheelchair. Patient lives in one level home. Pt uses tub bench that she can utilize to slide into shower; her daughter/husband assists with bathing. Pt has Lucianne Lei that can accommodate her power chair at this  time. Pt has concrete driveway to get up to her ramp. Pt has small threshold to get into her home - husband utilizes cardboard to get over threshold.   ? Limitations Walking;Standing;House hold activities;Other (comment)   transferring, bed mobility, bladder management  ? Patient Stated Goals to be able to walk more independently   ? ?  ?  ? ?  ? ? ?  ?TREATMENT ?  ?  ?Patient independently managed components of wheelchair to initiate session: doffing safety belt and moving hinged footrest. ModA required with donning gait belt due to decreased motor control and spasticity in L upper limb. Pt able to independently manage catheter drainage bag once removed from hook under power chair seat  ?  ?  ?  ?Therapeutic Activities: ?  ?*not today* ?Bed mobility task practice: long-sitting in reclined position on wedge, contralateral reach and trunk-lift; 1x12 and 1x10 ea dir, alternating R and L ?Ambulate laps in gym x 2 with PT on patient's L side with intermittent contact guard; verbal cueing for foot clearance as needed ?  ?  ?Neuro Re-Ed:  ?Stepping drills performed with FWW: ?Consecutive step-over with Tband; 3/4 lap around gym and step over 4 Tbands flat on floor, step-to pattern with alternating leading lower extremity; x3 laps ? ?Sidestep over 4 Tbands flat on floor 2x D/B with CGA from therapist ?  ?Reaching and grasping task using varying-size balls; moving from on tray to adjacent tray on high table; x 5 minutes ?  ?  ?  ?  ?Standing and seated rest breaks required intermittently throughout session due to motor fatigue.  ?  ?Pt independently transferred stand to sit in power chair at end of session, requires assistance for buckling seatbelt at end of session and placement of indwelling foley catheter onto hook. Pt IND with footrest placement ?  ?  ?  ?  ?*not today*   ?Standing lateral step over Tband laying flat on floor for step-length and LE ER/ABD required for bed and car transfers; 2x10 alternating L/R over  Tband on floor ?Standing theraband chest press to imitate pushing open doors (performed with low mat posterior to patient with pt standing in walker): Green Tband; 2x10 requiring loop tied for success holding with LUE due to spasticity in hand and limited grip strength, bilateral UE push with band looped around mid-back ?In // bars:  ?RW amb step up and over x3 airex foam pads in // bars; D/B x 1, on second attempted set, stopped at second Airex pad on return trip due to safety concern with LLE motor fatigue and significant difficulty attaining toe-off with ModA and careful guarding from therapist needed to move L lower limb from Airex pad. CGA only during other attempts. ?  Ambulation through doorway threshold and along outside sidewalk with FWW, around perimeter of building including walking along curb cuts x 4, incline/decline, and grassy terrain. Pt walked to bench adjacent to front door of office; x 15 minutes, with CGA during obstacle negotiation and close supervision only on even ground. Pt maintaining upright stance while negotiating  varying incline/decline to challenge trunk control ? Amb with RW along floor ladder alternating cone taps for improving hip clearance. CGA provided. Pt unable to tap top of cone but is < 1" away from being able to perform. X2 laps.  ?        ?  ?  ?  ?  ?ASSESSMENT ?Today's visit focused on gait, toe clearance during stepping, and obstacle negotiation. Patient demonstrates safe negotiation of low-lying obstacle on floor requiring increased step length and increased demand on targeting lower limbs during stepping. She is also able to complete lateral stepping over Tbands on floor with adequate toe clearance and no LOB with use of FWW. Pt does have difficulty grasping secondary to spasticity of L digits with most difficulty extending her 2nd interphalangeal joints (pt has to use opposite hand to extend digits intermittently). Patient is demonstrating significantly improving  efficiency with gait c FWW. Pt will continue to benefit from skilled PT services to progress independence with functional mobility and reduced risk of falls.  ?   ? ? ? ? ? PT Short Term Goals - 06/16/21 1155   ? ?  ?

## 2021-06-30 NOTE — Patient Instructions (Signed)
Vinegar Bladder Irrigation Protocol patient education  Patient's on intermittent catheterization with chronic bacteriuria and/or chronic bladder stones, irrigating the bladder with a dilute vinegar solution can be beneficial.  The recommended concentration is 0.25% acetic acid. Most grocery stores carry white vinegar as a 5% solution.  Therefore, to make appropriate bladder irrigations, it needs to be diluted at a ratio of roughly 20:1.  To achieve this, see the chart below to determine what amount of 5% white vinegar solution you should mix with your normal bladder irrigation (homemade saline or sterile sodium chloride from the pharmacy).  Amount of 5% White Vinegar Solution to Mix In: If Your Normal Bladder Irrigation Amount Is: 2.5 teaspoons (12.5 mL) 250 mL irrigation 5 teaspoons (25 mL) 500 mL irrigation 10 teaspoons (50 mL) 1000 mL irrigation About 6 ounces 1 gallon irrigation  It might be helpful to remember:  One teaspoon = 5 mL  One tablespoon = 15 mL  Irrigate the bladder with this solution using the volumes and techniques you've discussed with your health care provider. In certain circumstances, your provider might instruct you to leave some irrigation in the bladder for a period of time to dissolve debris/mucous. Discontinue irrigation if it causes pain or discomfort. Do not use this solution if you believe your child has an acute urinary tract infection.  

## 2021-07-02 ENCOUNTER — Ambulatory Visit: Payer: Medicare PPO | Admitting: Physical Therapy

## 2021-07-02 ENCOUNTER — Encounter: Payer: Self-pay | Admitting: Physical Therapy

## 2021-07-02 ENCOUNTER — Other Ambulatory Visit: Payer: Self-pay

## 2021-07-02 DIAGNOSIS — R262 Difficulty in walking, not elsewhere classified: Secondary | ICD-10-CM

## 2021-07-02 DIAGNOSIS — M6281 Muscle weakness (generalized): Secondary | ICD-10-CM | POA: Diagnosis not present

## 2021-07-02 DIAGNOSIS — S14123A Central cord syndrome at C3 level of cervical spinal cord, initial encounter: Secondary | ICD-10-CM

## 2021-07-02 NOTE — Therapy (Signed)
[place] ?[facility] ?[address] [name], [place], [address] ?Phone: [phone]   Fax:  [phone] ? ?Physical Therapy Treatment ? ?Patient Details  ?Name: Meredith Roach ?MRN: [id number] ?Date of Birth: 1941-02-23 ?Referring Provider (PT): [name], MD ? ? ?Encounter Date: 07/02/2021 ? ? PT End of Session - 07/05/21 0810   ? ? Visit Number 42   ? Number of Visits 92   ? Date for PT Re-Evaluation 06/12/21   ? Authorization Time Period Cert 6/71/24-5/80/99, last progress note 04/23/21; current auth 16-Visits 05/26/21 - 07/16/21, progress note on 05/28/21   ? Authorization - Visit Number 10   ? Authorization - Number of Visits 16   ? Progress Note Due on Visit 60   ? PT Start Time 1149   ? PT Stop Time 1235   ? PT Time Calculation (min) 46 min   ? Equipment Utilized During Treatment Gait belt   Pt uses power wheelchair, clinic's front-wheeled walker utilized for gait  ? Activity Tolerance Patient tolerated treatment well   ? Behavior During Therapy [facility] for tasks assessed/performed   ? ?  ?  ? ?  ? ? ? ?Past Medical History:  ?Diagnosis Date  ? Arthritis   ? GERD (gastroesophageal reflux disease)   ? Gout   ? Neuromuscular disorder ([place])   ? Pre-diabetes   ? ? ?Past Surgical History:  ?Procedure Laterality Date  ? ANTERIOR CERVICAL DECOMPRESSION/DISCECTOMY FUSION 4 LEVELS N/A 08/28/2019  ? Procedure: ANTERIOR CERVICAL DECOMPRESSION/DISCECTOMY FUSION CERVICALTHREE-FOUR CERVICAL,FOUR-FIVE,CERVICAL FIVE-SIX,CERVICAL SIX-SEVEN.;  Surgeon: [name], MD;  Location: [facility];  Service: Neurosurgery;  Laterality: N/A;  ANTERIOR  ? BACK SURGERY    ? CATARACT EXTRACTION W/PHACO Right 03/31/2018  ? Procedure: CATARACT EXTRACTION PHACO AND INTRAOCULAR LENS PLACEMENT (IOC);  Surgeon: [name], MD;  Location: [facility];  Service: Ophthalmology;  Laterality: Right;  [place] 00:39.7 ?CDE 4.35 ?Fluid Pack Lot # [id number]  ? CHOLECYSTECTOMY  1995  ? COLONOSCOPY WITH PROPOFOL N/A 06/07/2017  ?  Procedure: COLONOSCOPY WITH PROPOFOL;  Surgeon: [name], MD;  Location: [facility];  Service: Endoscopy;  Laterality: N/A;  ? ESOPHAGOGASTRODUODENOSCOPY (EGD) WITH PROPOFOL N/A 06/07/2017  ? Procedure: ESOPHAGOGASTRODUODENOSCOPY (EGD) WITH PROPOFOL;  Surgeon: [name], MD;  Location: [facility];  Service: Endoscopy;  Laterality: N/A;  ? IR CATHETER TUBE CHANGE  05/10/2020  ? REDUCTION MAMMAPLASTY Bilateral 1994  ? ? ?There were no vitals filed for this visit. ? ? Subjective Assessment - 07/05/21 0810   ? ? Subjective Patient reports feeling fairly well at arrival to PT. Patient reports L side is feeling fairly well this AM - some tightness around her L ankle. Patient reports compliance with her HEP.   ? Patient is accompained by: Family member   husband  ? 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 She had incomplete SCI following fall - C4 [place] D (DOI: 08/26/19). Primary activity limitations c difficulty walking, transferring, and completing independent functional mobility following incomplete SCI. She has undergone home health therapy for about one year. She and her husband reports she was able to transfer and ambulate with walker in home with PT walking beside her. Pt has practiced with front-wheel walker at home. 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 Patient reports using steady transfer aid for performing transfers chair to bed and W/C to standard chair. Her husband states that she is able to stand with steady device and can stand  with her legs against the bed. Pt has ramp to get in her home for her power wheelchair. Patient lives in one level home. Pt uses tub bench that she can utilize to slide into shower; her daughter/husband assists with bathing. Pt has Lucianne Lei that can accommodate her power chair at this time. Pt has concrete driveway to get up to her ramp. Pt has small threshold to get into her home - husband utilizes cardboard to get over  threshold.   ? Limitations Walking;Standing;House hold activities;Other (comment)   transferring, bed mobility, bladder management  ? Patient Stated Goals to be able to walk more independently   ? ?  ?  ? ?  ? ? ? ?  ?TREATMENT ?  ?  ?Patient independently managed components of wheelchair to initiate session including navigating lobby/gym and reversing into location adjacent to low mat: pt independently doffed W/C safety belt and moving hinged footrest. ModA required with donning gait belt due to decreased motor control and spasticity in L upper limb. Pt able to independently manage catheter drainage bag once removed from hook under power chair seat  ?  ?  ?  ?Therapeutic Activities: ?  ?Bed mobility task practice: performed roll into sidelying with trunk lift on R and L side; 1x15 and 1x12 ea side. Intermittent ModA with trunk control to initiate rolling (greater assist required for rolling to R) ? ?Task practice for supine to sit with use of RUE assist to pull from sidelying into sitting, R hand pulling on therapist's hand ?  ?*not today* ?Ambulate laps in gym x 2 with PT on patient's L side with intermittent contact guard; verbal cueing for foot clearance as needed  ?  ? ?Neuro Re-Ed:  ? ?Performed with FWW in front, low mat behind patient, therapist closely supervising ?Reaching to various targets on backside of mirror (9 sticky notes placed on flat surface), touching all targets with L upper limb and R upper limb; x 5 minutes ?Reaching and grasping task using varying-size balls; moving from on tray to adjacent tray on high table; x 5 minutes ?  ?  ?*not today* ?Consecutive step-over with Tband; 3/4 lap around gym and step over 4 Tbands flat on floor, step-to pattern with alternating leading lower extremity; x3 laps ?Sidestep over 4 Tbands flat on floor 2x D/B with CGA from therapist ?  ?  ? ? ?Standing and seated rest breaks required intermittently throughout session due to motor fatigue.  ?  ?Pt independently  transferred stand to sit in power chair at end of session, requires assistance for buckling seatbelt at end of session and placement of indwelling foley catheter onto hook. Pt IND with footrest placement ?  ?  ?  ?  ?*not today*   ?Standing lateral step over Tband laying flat on floor for step-length and LE ER/ABD required for bed and car transfers; 2x10 alternating L/R over Tband on floor ?Standing theraband chest press to imitate pushing open doors (performed with low mat posterior to patient with pt standing in walker): Green Tband; 2x10 requiring loop tied for success holding with LUE due to spasticity in hand and limited grip strength, bilateral UE push with band looped around mid-back ?In // bars:  ?RW amb step up and over x3 airex foam pads in // bars; D/B x 1, on second attempted set, stopped at second Airex pad on return trip due to safety concern with LLE motor fatigue and significant difficulty attaining toe-off with ModA and careful guarding from therapist  needed to move L lower limb from Airex pad. CGA only during other attempts. ?  Ambulation through doorway threshold and along outside sidewalk with FWW, around perimeter of building including walking along curb cuts x 4, incline/decline, and grassy terrain. Pt walked to bench adjacent to front door of office; x 15 minutes, with CGA during obstacle negotiation and close supervision only on even ground. Pt maintaining upright stance while negotiating varying incline/decline to challenge trunk control ? Amb with RW along floor ladder alternating cone taps for improving hip clearance. CGA provided. Pt unable to tap top of cone but is < 1" away from being able to perform. X2 laps.  ?        ?  ?  ?  ?  ?ASSESSMENT ?Patient's most significant functional limitation at this time is with bed mobility tasks including rolling, bridging, and supine to sit. Patient is able to perform L roll independently prior to motor fatigue. Patient requires Mod to Max assist  with rolling to R side. Level of assist is improved with use of patient's R upper limb to pull into sidelying in either direction (at home, patient and her spouse use stick that spouse is holding adjacent t

## 2021-07-07 ENCOUNTER — Other Ambulatory Visit: Payer: Self-pay

## 2021-07-07 ENCOUNTER — Ambulatory Visit: Payer: Medicare PPO | Admitting: Physical Therapy

## 2021-07-07 ENCOUNTER — Encounter: Payer: Self-pay | Admitting: Physical Therapy

## 2021-07-07 DIAGNOSIS — R262 Difficulty in walking, not elsewhere classified: Secondary | ICD-10-CM

## 2021-07-07 DIAGNOSIS — M6281 Muscle weakness (generalized): Secondary | ICD-10-CM | POA: Diagnosis not present

## 2021-07-07 DIAGNOSIS — S14123A Central cord syndrome at C3 level of cervical spinal cord, initial encounter: Secondary | ICD-10-CM

## 2021-07-07 NOTE — Therapy (Signed)
[place] ?[facility] ?[address] [name], [place], [address] ?Phone: [phone]   Fax:  [phone] ? ?Physical Therapy Treatment/Physical Therapy Progress Note ? ? ?Dates of reporting period  06/16/21   to   07/07/21 ? ?Patient Details  ?Name: Meredith Roach ?MRN: [id number] ?Date of Birth: [birth year] ?Referring Provider (PT): [name], MD ? ? ?Encounter Date: 07/07/2021 ? ? PT End of Session - 07/07/21 1026   ? ? Visit Number 16   ? Number of Visits 92   ? Date for PT Re-Evaluation 06/12/21   ? Authorization Time Period Cert 10/15/08-2/58/52, last progress note 07/07/21; current auth 16-Visits 05/26/21 - 07/16/21   ? Authorization - Visit Number 11   ? Authorization - Number of Visits 16   ? Progress Note Due on Visit 80   ? PT Start Time 1015   ? PT Stop Time 1100   ? PT Time Calculation (min) 45 min   ? Equipment Utilized During Treatment Gait belt   Pt uses power wheelchair, clinic's front-wheeled walker utilized for gait  ? Activity Tolerance Patient tolerated treatment well   ? Behavior During Therapy [facility] for tasks assessed/performed   ? ?  ?  ? ?  ? ? ?Past Medical History:  ?Diagnosis Date  ? Arthritis   ? GERD (gastroesophageal reflux disease)   ? Gout   ? Neuromuscular disorder ([place])   ? Pre-diabetes   ? ? ?Past Surgical History:  ?Procedure Laterality Date  ? ANTERIOR CERVICAL DECOMPRESSION/DISCECTOMY FUSION 4 LEVELS N/A 08/28/2019  ? Procedure: ANTERIOR CERVICAL DECOMPRESSION/DISCECTOMY FUSION CERVICALTHREE-FOUR CERVICAL,FOUR-FIVE,CERVICAL FIVE-SIX,CERVICAL SIX-SEVEN.;  Surgeon: [name], MD;  Location: [place];  Service: Neurosurgery;  Laterality: N/A;  ANTERIOR  ? BACK SURGERY    ? CATARACT EXTRACTION W/PHACO Right 03/31/2018  ? Procedure: CATARACT EXTRACTION PHACO AND INTRAOCULAR LENS PLACEMENT (IOC);  Surgeon: [name], MD;  Location: [facility];  Service: Ophthalmology;  Laterality: Right;  [place] 00:39.7 ?CDE 4.35 ?Fluid Pack Lot # [id number] H  ?  CHOLECYSTECTOMY  1995  ? COLONOSCOPY WITH PROPOFOL N/A 06/07/2017  ? Procedure: COLONOSCOPY WITH PROPOFOL;  Surgeon: [name], MD;  Location: [facility] END[facility]COPY;  Service: Endoscopy;  Laterality: N/A;  ? ESOPHAGOGASTRODUODENOSCOPY (EGD) WITH PROPOFOL N/A 06/07/2017  ? Procedure: ESOPHAGOGASTRODUODENOSCOPY (EGD) WITH PROPOFOL;  Surgeon: [name], MD;  Location: [facility] ENDOSCOPY;  Service: Endoscopy;  Laterality: N/A;  ? IR CATHETER TUBE CHANGE  05/10/2020  ? REDUCTION MAMMAPLASTY Bilateral 1994  ? ? ?There were no vitals filed for this visit. ? ? Subjective Assessment - 07/07/21 1016   ? ? Subjective Patient reports she has made great progress in regard to functional mobility and transfers. She an perform sit to stand on her own. She still needs her husband behind her during bed to chair transfer at this time. Patient reports doing well with bed mobility with use of RUE pulling on stick. Pt reports 60% SANE score; pt reports needing more trunk/core control. Patient reports she wants to be able to walk better. Patient reports satisfaction with her current progress.   ? Patient is accompained by: Family member   husband  ? 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 She had incomplete SCI following fall - C4 [place] D (DOI: 08/26/19). Primary activity limitations c difficulty walking, transferring, and completing independent functional mobility following incomplete SCI. She has undergone home health therapy for about one year. She and her husband reports she was able to transfer  and ambulate with walker in home with PT walking beside her. Pt has practiced with front-wheel walker at home. 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 Patient reports using steady transfer aid for performing transfers chair to bed and W/C to standard chair. Her husband states that she is able to stand with steady device and can stand with her legs against the bed. Pt has ramp to get in her home for  her power wheelchair. Patient lives in one level home. Pt uses tub bench that she can utilize to slide into shower; her daughter/husband assists with bathing. Pt has Lucianne Lei that can accommodate her power chair at this time. Pt has concrete driveway to get up to her ramp. Pt has small threshold to get into her home - husband utilizes cardboard to get over threshold.   ? Limitations Walking;Standing;House hold activities;Other (comment)   transferring, bed mobility, bladder management  ? Patient Stated Goals to be able to walk more independently   ? ?  ?  ? ?  ? ? ? ? ?OBJECTIVE ? ?  ?Gait ?Mild forward flexed posture onto FWW with decreased gait velocity, good toe clearance bilaterally with initial steps in clinic, moderate L toe drag following fatigue after walking 100 ft  ?  ?FUNCTIONAL TASKS ?Stand Pivot Transfer: Independent for power chair placement relative to low mat. Modified-independent for standing pivot with FWW and stand-to-sit.  ?  ?Rolling: MaxA required to facilitate ipsilateral trunk flexion/rotation for rolling in either direction. Modified-independent with use of external dowel to pull with R upper limb into either direction  ?  ?Supine to/from sit: IND with moving lower limbs to edge of bed for supine to sit. Patient able to roll into sidelying ModI with use of dowel held by therapist. ModA required after first 50% of sidelying to sitting transfer with pt unable to lift trunk beyond sidelying on her L elbow. Pt is IND with scooting once in hooklying position. ?  ?Dressing simulation with Theraband loop: with Red Theraband tied in loop, patient pulled band around bilateral feet and up to her thighs. Verbal cueing/supervision only for clearing band around heels on either side during donning. Pt able to simulate doffing clothes with Tband with verbal cueing for utilizing gravity to get band down to ankles. ?   ? ? ? ? ? ?  ?TREATMENT ?  ?  ?Patient independently managed components of wheelchair to  initiate session including navigating lobby/gym and reversing into location adjacent to low mat: pt independently doffed W/C safety belt and moving hinged footrest. ModA required with donning gait belt due to decreased motor control and spasticity in L upper limb. Pt able to independently manage catheter drainage bag once removed from hook under power chair seat  ?  ?  ?  ?Therapeutic Activities: ? ?*Re-assessment performed (see above and updated Goal section below) ? ? ?Bed mobility task practice: sitting to sidelying with roll to hooklying, ModA for bringing R lower limb to edge of bed and MaxA for L lower limb to edge of bed. Attempted bilateral rolling on low mat with significant difficulty. ModI rolling performed with therapist holding dowel, patient pulling with R upper limb to pull into either position. Supine to sidelying to sitting transfer performed with use of dowel held by therapist (pt pulling with R hand) ?  ? ?In // bars: Consecutive step up/down with 3 consecutive Airex pads; 2 with LLE leading, 1 with RLE leading; completed one repetition only today due to significant  motor fatigue following re-assessment ? ?  ?*not today* ?Task practice for supine to sit with use of RUE assist to pull from sidelying into sitting, R hand pulling on dowel held by therapist  ?Ambulate laps in gym x 2 with PT on patient's L side with intermittent contact guard; verbal cueing for foot clearance as needed  ?  ?  ?Neuro Re-Ed:  ? ?  ?  ?*not today* ?Consecutive step-over with Tband; 3/4 lap around gym and step over 4 Tbands flat on floor, step-to pattern with alternating leading lower extremity; x3 laps ?Sidestep over 4 Tbands flat on floor 2x D/B with CGA from therapist ?Performed with FWW in front, low mat behind patient, therapist closely supervising ?Reaching to various targets on backside of mirror (9 sticky notes placed on flat surface), touching all targets with L upper limb and R upper limb; x 5 minutes ?Reaching  and grasping task using varying-size balls; moving from on tray to adjacent tray on high table; x 5 minutes ?  ?  ?  ?Standing and seated rest breaks required intermittently throughout session due to motor fatigue.  ?  ?

## 2021-07-09 ENCOUNTER — Encounter: Payer: Medicare PPO | Admitting: Physical Therapy

## 2021-07-11 NOTE — Telephone Encounter (Signed)
Can you order the HHA- thanks- ML

## 2021-07-14 ENCOUNTER — Ambulatory Visit: Payer: Medicare PPO | Attending: Physical Medicine and Rehabilitation | Admitting: Physical Therapy

## 2021-07-14 DIAGNOSIS — S14123A Central cord syndrome at C3 level of cervical spinal cord, initial encounter: Secondary | ICD-10-CM | POA: Diagnosis present

## 2021-07-14 DIAGNOSIS — R262 Difficulty in walking, not elsewhere classified: Secondary | ICD-10-CM | POA: Diagnosis present

## 2021-07-14 DIAGNOSIS — M6281 Muscle weakness (generalized): Secondary | ICD-10-CM | POA: Insufficient documentation

## 2021-07-14 NOTE — Therapy (Signed)
?OUTPATIENT PHYSICAL THERAPY TREATMENT NOTE ? ? ?Patient Name: CEONNA FRAZZINI ?MRN: [id number] ?DOB:1940/10/23, 81 y.o., female ?Today's Date: 07/15/2021 ? ?PCP: Rusty Aus, MD ?REFERRING PROVIDER: [name], MD ? ? PT End of Session - 07/14/21 1313   ? ? Visit Number 45   ? Number of Visits 92   ? Date for PT Re-Evaluation 06/12/21   ? Authorization Time Period Cert 8/0/99-8/33/82, last progress note 07/07/21; current auth 16-Visits 05/26/21 - 07/16/21   ? Authorization - Visit Number 12   ? Authorization - Number of Visits 16   ? Progress Note Due on Visit 80   ? PT Start Time 1144   ? PT Stop Time 1234   ? PT Time Calculation (min) 50 min   ? Equipment Utilized During Treatment Gait belt   Pt uses power wheelchair, clinic's front-wheeled walker utilized for gait  ? Activity Tolerance Patient tolerated treatment well   ? Behavior During Therapy [facility] for tasks assessed/performed   ? ?  ?  ? ?  ? ? ?Past Medical History:  ?Diagnosis Date  ? Arthritis   ? GERD (gastroesophageal reflux disease)   ? Gout   ? Neuromuscular disorder ([place])   ? Pre-diabetes   ? ?Past Surgical History:  ?Procedure Laterality Date  ? ANTERIOR CERVICAL DECOMPRESSION/DISCECTOMY FUSION 4 LEVELS N/A 08/28/2019  ? Procedure: ANTERIOR CERVICAL DECOMPRESSION/DISCECTOMY FUSION CERVICALTHREE-FOUR CERVICAL,FOUR-FIVE,CERVICAL FIVE-SIX,CERVICAL SIX-SEVEN.;  Surgeon: [name], MD;  Location: [place];  Service: Neurosurgery;  Laterality: N/A;  ANTERIOR  ? BACK SURGERY    ? CATARACT EXTRACTION W/PHACO Right 03/31/2018  ? Procedure: CATARACT EXTRACTION PHACO AND INTRAOCULAR LENS PLACEMENT (IOC);  Surgeon: [name], MD;  Location: [facility];  Service: Ophthalmology;  Laterality: Right;  [place] 00:39.7 ?CDE 4.35 ?Fluid Pack Lot # [id number] H  ? CHOLECYSTECTOMY  1995  ? COLONOSCOPY WITH PROPOFOL N/A 06/07/2017  ? Procedure: COLONOSCOPY WITH PROPOFOL;  Surgeon: [name], MD;  Location: [facility] END[facility]COPY;  Service: Endoscopy;  Laterality: N/A;  ?  ESOPHAGOGASTRODUODENOSCOPY (EGD) WITH PROPOFOL N/A 06/07/2017  ? Procedure: ESOPHAGOGASTRODUODENOSCOPY (EGD) WITH PROPOFOL;  Surgeon: [name], MD;  Location: [facility] ENDOSCOPY;  Service: Endoscopy;  Laterality: N/A;  ? IR CATHETER TUBE CHANGE  05/10/2020  ? REDUCTION MAMMAPLASTY Bilateral 1994  ? ?Patient Active Problem List  ? Diagnosis Date Noted  ? Generalized anxiety disorder 01/24/2021  ? Suspected deep tissue injury 10/25/2020  ? 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 Spasticity 11/27/2019  ? Hyperkalemia   ? Steroid-induced hyperglycemia   ? Prediabetes   ? Neurogenic bowel   ? Neurogenic bladder   ? Neuropathic pain   ? Quadriplegia ([place]) 08/31/2019  ? S/P cervical spinal fusion 08/28/2019  ? 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 Radiculopathy 10/21/2016  ? ? ?REFERRING DIAG:  ?R25.2 (ICD-10-CM) - Spasticity  ?G82.50 (ICD-10-CM) - Quadriplegia ([place])  ?Z99.3 (ICD-10-CM) - Wheelchair dependence  ? ? ?THERAPY DIAG:  ?Muscle weakness (generalized) ? 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 ?Difficulty in walking, not elsewhere classified ? ?PERTINENT HISTORY: Patient is a 81 year old female s/p incomplete SCI. She had incomplete SCI following fall - C4 [place] D (DOI: 08/26/19). Primary activity limitations c difficulty walking, transferring, and completing independent functional mobility following incomplete SCI. She has undergone home health therapy for about one year. She and her husband reports she was able to transfer and ambulate with walker in home with PT walking beside  her. Pt has practiced with front-wheel walker at home. 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 Patient reports using steady transfer aid for performing transfers chair to bed and W/C to standard chair. Her husband states that she is able to stand with steady device and can stand with her legs against the bed. Pt has ramp to get in her home for  her power wheelchair. Patient lives in one level home. Pt uses tub bench that she can utilize to slide into shower; her daughter/husband assists with bathing. Pt has Lucianne Lei that can accommodate her power chair at this time. Pt has concrete driveway to get up to her ramp. Pt has small threshold to get into her home - husband utilizes cardboard to get over threshold.  ? ?PRECAUTIONS: Fall risk, indwelling Foley catheter ? ?SUBJECTIVE: Patient reports continuing to walk with her walker at home. Patient reports doing well with getting up from the bed using her stick (spouse holding stick/dowel and patient pulling with stronger R hand) and less assistance required with supine to sit. Pt reports notable tightness in L hand and L lower limb; pt has upcoming Botox injection this Thursday.  ? ?PAIN:  ?Are you having pain? No ?-pt attests to tightness in L hand and her L lower limb ? ? ? ?  ?  ?TREATMENT TODAY ?  ?  ?Patient independently managed components of wheelchair to initiate session including navigating lobby/gym and reversing into location adjacent to low mat: pt independently doffed W/C safety belt and moving hinged footrest. ModA required with donning gait belt due to decreased motor control and spasticity in L upper limb. Pt able to independently manage catheter drainage bag once removed from hook under power chair seat  ?  ?  ?  ?Therapeutic Activities: ?  ?Ambulate laps in gym x 1 with PT on patient's L side with intermittent contact guard; verbal cueing for foot clearance as needed  ? ?Ambulation over uneven ground simulated with large exercise mat lying on top of various-size ankle weights; 4x D/B ? ? ?*next visit* ?In // bars: Consecutive step up/down with 3 consecutive Airex pads; 2 with LLE leading, 1 with RLE leading; completed one repetition only today due to significant motor fatigue following re-assessment ?  ?  ?*not today* ?Task practice for supine to sit with use of RUE assist to pull from sidelying  into sitting, R hand pulling on dowel held by therapist  ? ?  ?  ?Neuro Re-Ed:  ?  ? ?Consecutive step-over with Tband; 4 Tbands lying flat on floor; step-to pattern with alternating leading lower extremity; x4 D/B ? ?Sidestep over 4 Tbands flat on floor 3x D/B with CGA from therapist ?  ?Reaching and grasping task using clothes pins with varying resistance; moving from flat surface on tray table to adjacent container; x 5 minutes ? ? ? ?  ?Standing and seated rest breaks required intermittently throughout session due to motor fatigue.  ?  ? ?Pt independently transferred stand to sit in power chair at end of session, requires assistance for buckling seatbelt at end of session and placement of indwelling foley catheter onto hook. Pt IND with footrest placement  ? ? ? ?*not today* ?Performed with FWW in front, low mat behind patient, therapist closely supervising ?Reaching to various targets on backside of mirror (9 sticky notes placed on flat surface), touching all targets with L upper limb and R upper limb; x 5 minutes ?Reaching and grasping task using varying-size balls; moving  from on tray to adjacent tray on high table; x 5 minutes  ?Standing theraband chest press to imitate pushing open doors (performed with low mat posterior to patient with pt standing in walker): Green Tband; 2x10 requiring loop tied for success holding with LUE due to spasticity in hand and limited grip strength, bilateral UE push with band looped around mid-back ?In // bars:  ?RW amb step up and over x3 airex foam pads in // bars; D/B x 1, on second attempted set, stopped at second Airex pad on return trip due to safety concern with LLE motor fatigue and significant difficulty attaining toe-off with ModA and careful guarding from therapist needed to move L lower limb from Airex pad. CGA only during other attempts. ?  Ambulation through doorway threshold and along outside sidewalk with FWW, around perimeter of building including walking along  curb cuts x 4, incline/decline, and grassy terrain. Pt walked to bench adjacent to front door of office; x 15 minutes, with CGA during obstacle negotiation and close supervision only on even ground. Pt maintai

## 2021-07-15 ENCOUNTER — Ambulatory Visit: Payer: Medicare PPO | Admitting: Physical Therapy

## 2021-07-15 ENCOUNTER — Encounter: Payer: Self-pay | Admitting: Physical Therapy

## 2021-07-15 DIAGNOSIS — M6281 Muscle weakness (generalized): Secondary | ICD-10-CM

## 2021-07-15 DIAGNOSIS — R262 Difficulty in walking, not elsewhere classified: Secondary | ICD-10-CM

## 2021-07-15 DIAGNOSIS — S14123A Central cord syndrome at C3 level of cervical spinal cord, initial encounter: Secondary | ICD-10-CM

## 2021-07-15 NOTE — Therapy (Signed)
?OUTPATIENT PHYSICAL THERAPY TREATMENT NOTE ? ? ?Patient Name: Meredith Roach ?MRN: [id number] ?DOB:1941/03/14, 81 y.o., female ?Today's Date: 07/18/2021 ? ?PCP: Rusty Aus, MD ?REFERRING PROVIDER: [name], MD ? ? PT End of Session - 07/18/21 0813   ? ? Visit Number 72   ? Number of Visits 92   ? Date for PT Re-Evaluation 06/12/21   ? Authorization Time Period Cert 10/18/44-9/62/95, last progress note 07/07/21; current auth 16-Visits 05/26/21 - 07/16/21   ? Authorization - Visit Number 13   ? Authorization - Number of Visits 16   ? Progress Note Due on Visit 80   ? PT Start Time 1033   ? PT Stop Time 1115   ? PT Time Calculation (min) 42 min   ? Equipment Utilized During Treatment Gait belt   Pt uses power wheelchair, clinic's front-wheeled walker utilized for gait  ? Activity Tolerance Patient tolerated treatment well   ? Behavior During Therapy [facility] for tasks assessed/performed   ? ?  ?  ? ?  ? ? ?Past Medical History:  ?Diagnosis Date  ? Arthritis   ? GERD (gastroesophageal reflux disease)   ? Gout   ? Neuromuscular disorder ([place])   ? Pre-diabetes   ? ?Past Surgical History:  ?Procedure Laterality Date  ? ANTERIOR CERVICAL DECOMPRESSION/DISCECTOMY FUSION 4 LEVELS N/A 08/28/2019  ? Procedure: ANTERIOR CERVICAL DECOMPRESSION/DISCECTOMY FUSION CERVICALTHREE-FOUR CERVICAL,FOUR-FIVE,CERVICAL FIVE-SIX,CERVICAL SIX-SEVEN.;  Surgeon: [name], MD;  Location: [place];  Service: Neurosurgery;  Laterality: N/A;  ANTERIOR  ? BACK SURGERY    ? CATARACT EXTRACTION W/PHACO Right 03/31/2018  ? Procedure: CATARACT EXTRACTION PHACO AND INTRAOCULAR LENS PLACEMENT (IOC);  Surgeon: [name], MD;  Location: [facility];  Service: Ophthalmology;  Laterality: Right;  [place] 00:39.7 ?CDE 4.35 ?Fluid Pack Lot # [id number]  ? CHOLECYSTECTOMY  1995  ? COLONOSCOPY WITH PROPOFOL N/A 06/07/2017  ? Procedure: COLONOSCOPY WITH PROPOFOL;  Surgeon: [name], MD;  Location: [facility] ENDOSCOPY;  Service: Endoscopy;  Laterality: N/A;  ?  ESOPHAGOGASTRODUODENOSCOPY (EGD) WITH PROPOFOL N/A 06/07/2017  ? Procedure: ESOPHAGOGASTRODUODENOSCOPY (EGD) WITH PROPOFOL;  Surgeon: [name], MD;  Location: [facility] ENDOSCOPY;  Service: Endoscopy;  Laterality: N/A;  ? IR CATHETER TUBE CHANGE  05/10/2020  ? REDUCTION MAMMAPLASTY Bilateral 1994  ? ?Patient Active Problem List  ? Diagnosis Date Noted  ? Generalized anxiety disorder 01/24/2021  ? Suspected deep tissue injury 10/25/2020  ? 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 Spasticity 11/27/2019  ? Hyperkalemia   ? Steroid-induced hyperglycemia   ? Prediabetes   ? Neurogenic bowel   ? Neurogenic bladder   ? Neuropathic pain   ? Quadriplegia ([place]) 08/31/2019  ? S/P cervical spinal fusion 08/28/2019  ? 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 Radiculopathy 10/21/2016  ? ? ?  ?REFERRING DIAG:  ?R25.2 (ICD-10-CM) - Spasticity  ?G82.50 (ICD-10-CM) - Quadriplegia ([place])  ?Z99.3 (ICD-10-CM) - Wheelchair dependence  ?  ?  ?THERAPY DIAG:  ?Muscle weakness (generalized) ?  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  ?Difficulty in walking, not elsewhere classified ?  ?PERTINENT HISTORY: Patient is a 81 year old female s/p incomplete SCI. She had incomplete SCI following fall - C4 [place] D (DOI: 08/26/19). Primary activity limitations c difficulty walking, transferring, and completing independent functional mobility following incomplete SCI. She has undergone home health therapy for about one year. She and her husband reports she was able to transfer and ambulate with  walker in home with PT walking beside her. Pt has practiced with front-wheel walker at home. 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 Patient reports using steady transfer aid for performing transfers chair to bed and W/C to standard chair. Her husband states that she is able to stand with steady device and can stand with her legs against the bed. Pt has ramp to get in her  home for her power wheelchair. Patient lives in one level home. Pt uses tub bench that she can utilize to slide into shower; her daughter/husband assists with bathing. Pt has Lucianne Lei that can accommodate her power chair at this time. Pt has concrete driveway to get up to her ramp. Pt has small threshold to get into her home - husband utilizes cardboard to get over threshold.  ?  ?PRECAUTIONS: Fall risk, indwelling Foley catheter ?  ?SUBJECTIVE: Patient reports no  major concerns since yesterday's visit. Patient reports feeling good after yesterdays visit. No new complaints at arrival.  ? ? ?PAIN:  ?Are you having pain? No ?-pt attests to tightness in L hand and her L lower limb ?  ?  ?  ?  ?  ?TREATMENT TODAY ?  ?  ?Patient independently managed components of wheelchair to initiate session including navigating lobby/gym and reversing into location adjacent to low mat: pt independently doffed W/C safety belt and moving hinged footrest. ModA required with donning gait belt due to decreased motor control and spasticity in L upper limb. Pt able to independently manage catheter drainage bag once removed from hook under power chair seat  ?  ?  ?  ?Therapeutic Activities: ?  ? ?Bed mobility task practice: bilateral rolling with no external device to assist. Trunk lift in sidelying R and L for 2x12 repetitions. Bilateral rolling x 1 with use of dowel held by therapist for patient to pull with RUE. Sitting to supine and supine to sit performed x 1 (patient able to lift RLE to edge of low mat with supervision only during sitting to supine transfer, MaxA for bringing L lower limb to low mat; MaxA required to complete supine to sit following trunk lift onto L elbow with lower limbs off of edge of mat)  ? ? ?In // bars: Consecutive step up/down with 3 consecutive Airex pads; 2 with LLE leading, 1 with RLE leading; completed one repetition only today due to significant motor fatigue following re-assessment ?  ?  ? ? ?*not  today* ?Task practice for supine to sit with use of RUE assist to pull from sidelying into sitting, R hand pulling on dowel held by therapist  ?Ambulate laps in gym x 1 with PT on patient's L side with intermittent contact guard; verbal cueing for foot clearance as needed  ?Ambulation over uneven ground simulated with large exercise mat lying on top of various-size ankle weights; 4x D/B ?  ?  ?  ?  ?Neuro Re-Ed:  ?  ?Supine alternating march, hip flexion to 90 deg in hooklying; 2x15 alternating for proximal core control with distal lower limb management in lying as needed for positioning in bed and bed transfers ? ?Seated lateral step over 6-inch hurdle to simulate clearing each lower limb during bed and car tranfers; x10 over and back with bilateral LE ?  ?Standing in [place] with low mat behind patient, raised tray table in front of walker; Reaching and grasping task using clothes pins with varying resistance; moving from flat surface on tray table to adjacent container; x  5 minutes ? ?   ? ? ?  ?Standing and seated rest breaks required intermittently throughout session due to motor fatigue.  ?  ?  ?Pt completed ModI pivot and ModI transfer stand to sit in power chair at end of session, no assistance required for buckling seatbelt at end of session; patient dependent with placement of indwelling foley catheter bag onto hook under her W/C seat. Pt IND with footrest placement  ?  ?  ?  ?*not today* ?Consecutive step-over with Tband; 4 Tbands lying flat on floor; step-to pattern with alternating leading lower extremity; x4 D/B ?Sidestep over 4 Tbands flat on floor 3x D/B with CGA from therapist ?Performed with FWW in front, low mat behind patient, therapist closely supervising ?Reaching to various targets on backside of mirror (9 sticky notes placed on flat surface), touching all targets with L upper limb and R upper limb; x 5 minutes ?Reaching and grasping task using varying-size balls; moving from on tray to adjacent  tray on high table; x 5 minutes  ?Standing theraband chest press to imitate pushing open doors (performed with low mat posterior to patient with pt standing in walker): Green Tband; 2x10 requiring loop tied for success h

## 2021-07-16 ENCOUNTER — Encounter: Payer: Medicare PPO | Admitting: Physical Therapy

## 2021-07-17 ENCOUNTER — Encounter: Payer: Medicare PPO | Attending: Physical Medicine and Rehabilitation | Admitting: Physical Medicine & Rehabilitation

## 2021-07-17 ENCOUNTER — Encounter: Payer: Self-pay | Admitting: Physical Medicine & Rehabilitation

## 2021-07-17 VITALS — BP 116/73 | HR 85

## 2021-07-17 DIAGNOSIS — G8250 Quadriplegia, unspecified: Secondary | ICD-10-CM | POA: Insufficient documentation

## 2021-07-17 NOTE — Patient Instructions (Signed)

## 2021-07-17 NOTE — Progress Notes (Signed)
Botox Injection for spasticity using needle EMG guidance   Dilution: 50 Units/ml Indication: Severe spasticity which interferes with ADL,mobility and/or  hygiene and is unresponsive to medication management and other conservative care Informed consent was obtained after describing risks and benefits of the procedure with the patient. This includes bleeding, bruising, infection, excessive weakness, or medication side effects. A REMS form is on file and signed. Needle: 27g 1" needle electrode Number of units per muscle LEFT Brachioradialis 25 FCR0 FCU0 FDS50 FDP50 FPL0 Lumbricals 25 x 3=75 Waste 0U All injections were done after obtaining appropriate EMG activity and after negative drawback for blood. The patient tolerated the procedure well. Post procedure instructions were given.   

## 2021-07-18 ENCOUNTER — Encounter: Payer: Self-pay | Admitting: Physical Therapy

## 2021-07-21 ENCOUNTER — Encounter: Payer: Self-pay | Admitting: Physical Therapy

## 2021-07-21 ENCOUNTER — Ambulatory Visit: Payer: Medicare PPO | Admitting: Physical Therapy

## 2021-07-21 DIAGNOSIS — M6281 Muscle weakness (generalized): Secondary | ICD-10-CM | POA: Diagnosis not present

## 2021-07-21 DIAGNOSIS — S14123A Central cord syndrome at C3 level of cervical spinal cord, initial encounter: Secondary | ICD-10-CM

## 2021-07-21 DIAGNOSIS — R262 Difficulty in walking, not elsewhere classified: Secondary | ICD-10-CM

## 2021-07-21 NOTE — Therapy (Signed)
?OUTPATIENT PHYSICAL THERAPY TREATMENT NOTE ? ? ?Patient Name: Meredith Roach ?MRN: [id number] ?DOB:[birth year], 81 y.o., female ?Today's Date: 07/21/2021 ? ?PCP: Rusty Aus, MD ?REFERRING PROVIDER: [name], MD ? ?END OF SESSION:  ? PT End of Session - 07/21/21 1138   ? ? Visit Number 40   ? Number of Visits 92   ? Date for PT Re-Evaluation 06/12/21   ? Authorization Time Period Cert 04/16/46-1/85/63, last progress note 07/07/21; current auth 16-Visits 05/26/21 - 07/16/21   ? Authorization - Visit Number 14   ? Authorization - Number of Visits 16   ? Progress Note Due on Visit 80   ? PT Start Time 1140   ? PT Stop Time 1230   ? PT Time Calculation (min) 50 min   ? Equipment Utilized During Treatment Gait belt   Pt uses power wheelchair, clinic's front-wheeled walker utilized for gait  ? Activity Tolerance Patient tolerated treatment well   ? Behavior During Therapy [facility] for tasks assessed/performed   ? ?  ?  ? ?  ? ? ?Past Medical History:  ?Diagnosis Date  ? Arthritis   ? GERD (gastroesophageal reflux disease)   ? Gout   ? Neuromuscular disorder ([place])   ? Pre-diabetes   ? ?Past Surgical History:  ?Procedure Laterality Date  ? ANTERIOR CERVICAL DECOMPRESSION/DISCECTOMY FUSION 4 LEVELS N/A 08/28/2019  ? Procedure: ANTERIOR CERVICAL DECOMPRESSION/DISCECTOMY FUSION CERVICALTHREE-FOUR CERVICAL,FOUR-FIVE,CERVICAL FIVE-SIX,CERVICAL SIX-SEVEN.;  Surgeon: [name], MD;  Location: [place];  Service: Neurosurgery;  Laterality: N/A;  ANTERIOR  ? BACK SURGERY    ? CATARACT EXTRACTION W/PHACO Right 03/31/2018  ? Procedure: CATARACT EXTRACTION PHACO AND INTRAOCULAR LENS PLACEMENT (IOC);  Surgeon: [name], MD;  Location: [facility];  Service: Ophthalmology;  Laterality: Right;  [place] 00:39.7 ?CDE 4.35 ?Fluid Pack Lot # [id number] H  ? CHOLECYSTECTOMY  1995  ? COLONOSCOPY WITH PROPOFOL N/A 06/07/2017  ? Procedure: COLONOSCOPY WITH PROPOFOL;  Surgeon: [name], MD;  Location: [facility] ENDOSCOPY;  Service: Endoscopy;   Laterality: N/A;  ? ESOPHAGOGASTRODUODENOSCOPY (EGD) WITH PROPOFOL N/A 06/07/2017  ? Procedure: ESOPHAGOGASTRODUODENOSCOPY (EGD) WITH PROPOFOL;  Surgeon: [name], MD;  Location: [facility] ENDOSCOPY;  Service: Endoscopy;  Laterality: N/A;  ? IR CATHETER TUBE CHANGE  05/10/2020  ? REDUCTION MAMMAPLASTY Bilateral 1994  ? ?Patient Active Problem List  ? Diagnosis Date Noted  ? Generalized anxiety disorder 01/24/2021  ? Suspected deep tissue injury 10/25/2020  ? 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 Spasticity 11/27/2019  ? Hyperkalemia   ? Steroid-induced hyperglycemia   ? Prediabetes   ? Neurogenic bowel   ? Neurogenic bladder   ? Neuropathic pain   ? Quadriplegia ([place]) 08/31/2019  ? S/P cervical spinal fusion 08/28/2019  ? 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 Radiculopathy 10/21/2016  ? ? ?REFERRING DIAG:  ?R25.2 (ICD-10-CM) - Spasticity  ?G82.50 (ICD-10-CM) - Quadriplegia ([place])  ?Z99.3 (ICD-10-CM) - Wheelchair dependence  ?  ?  ?THERAPY DIAG:  ?Muscle weakness (generalized) ?  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  ?Difficulty in walking, not elsewhere classified ?  ?PERTINENT HISTORY: Patient is a 81 year old female s/p incomplete SCI. She had incomplete SCI following fall - C4 [place] D (DOI: 08/26/19). Primary activity limitations c difficulty walking, transferring, and completing independent functional mobility following incomplete SCI. She has undergone home health therapy for about one year. She and her husband reports she was able to transfer and  ambulate with walker in home with PT walking beside her. Pt has practiced with front-wheel walker at home. 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 Patient reports using steady transfer aid for performing transfers chair to bed and W/C to standard chair. Her husband states that she is able to stand with steady device and can stand with her legs against the bed. Pt has  ramp to get in her home for her power wheelchair. Patient lives in one level home. Pt uses tub bench that she can utilize to slide into shower; her daughter/husband assists with bathing. Pt has Lucianne Lei that can accommodate her power chair at this time. Pt has concrete driveway to get up to her ramp. Pt has small threshold to get into her home - husband utilizes cardboard to get over threshold.  ?  ?PRECAUTIONS: Fall risk, indwelling Foley catheter ?  ?SUBJECTIVE: Patient had Botox injections end of last week. She feels that her digits are remaining extended better. She reports tolerating last visit well. Pt reports no new complaints at arrival this AM.  ?  ?PAIN:  ?Are you having pain? No ?-pt attests to tightness in L hand and her L lower limb ?  ?  ?  ?  ?  ?TREATMENT TODAY ?  ?  ?Patient independently managed components of wheelchair to initiate session including navigating lobby/gym and reversing into location adjacent to low mat: pt independently doffed W/C safety belt and moved hinged footrest. ModA required with donning gait belt due to decreased motor control and spasticity in L upper limb. Pt able to independently manage catheter drainage bag once removed from hook under power chair seat. ?  ?  ?  ?Therapeutic Activities: ?  ?Ambulate laps in gym x 1 with PT on patient's L side with intermittent contact guard; verbal cueing for foot clearance as needed  ?  ?Ambulation over uneven ground simulated with large exercise mat lying on top of various-size ankle weights; 4x D/B ?  ?  ?*next visit* ?In // bars: Consecutive step up/down with 3 consecutive Airex pads; 2 with LLE leading, 1 with RLE leading; completed one repetition only today due to significant motor fatigue following re-assessment ?  ?  ?*not today* ?Task practice for supine to sit with use of RUE assist to pull from sidelying into sitting, R hand pulling on dowel held by therapist  ?  ?  ?  ?Neuro Re-Ed:  ?  ?  ?Consecutive step-over with 2 wooden beams  and 2 Tbands; 4 Tbands lying flat on floor; step-to pattern with alternating leading lower extremity; x3 D/B ?  ?Sidestep over 4 Tbands flat on floor 3x D/B with CGA from therapist ?  ?Reaching and grasping task using clothes pins and various size/texture balls; moving from flat surface on tray table to adjacent container; x 5 minutes ?  ?  ?  ?  ?Standing and seated rest breaks required intermittently throughout session due to motor fatigue.  ?  ?  ?Pt independently transferred stand to sit in power chair at end of session, requires assistance for buckling seatbelt at end of session and placement of indwelling foley catheter onto hook. Pt IND with footrest placement  ?  ?  ?  ?*not today* ?Performed with FWW in front, low mat behind patient, therapist closely supervising ?Reaching to various targets on backside of mirror (9 sticky notes placed on flat surface), touching all targets with L upper limb and R upper limb; x 5 minutes ?Reaching and  grasping task using varying-size balls; moving from on tray to adjacent tray on high table; x 5 minutes  ?Standing theraband chest press to imitate pushing open doors (performed with low mat posterior to patient with pt standing in walker): Green Tband; 2x10 requiring loop tied for success holding with LUE due to spasticity in hand and limited grip strength, bilateral UE push with band looped around mid-back ?In // bars:  ?RW amb step up and over x3 airex foam pads in // bars; D/B x 1, on second attempted set, stopped at second Airex pad on return trip due to safety concern with LLE motor fatigue and significant difficulty attaining toe-off with ModA and careful guarding from therapist needed to move L lower limb from Airex pad. CGA only during other attempts. ?  Ambulation through doorway threshold and along outside sidewalk with FWW, around perimeter of building including walking along curb cuts x 4, incline/decline, and grassy terrain. Pt walked to bench adjacent to front  door of office; x 15 minutes, with CGA during obstacle negotiation and close supervision only on even ground. Pt maintaining upright stance while negotiating varying incline/decline to challenge trunk control ?

## 2021-07-23 ENCOUNTER — Ambulatory Visit: Payer: Medicare PPO | Admitting: Physical Therapy

## 2021-07-23 DIAGNOSIS — R262 Difficulty in walking, not elsewhere classified: Secondary | ICD-10-CM

## 2021-07-23 DIAGNOSIS — M6281 Muscle weakness (generalized): Secondary | ICD-10-CM | POA: Diagnosis not present

## 2021-07-23 DIAGNOSIS — S14123A Central cord syndrome at C3 level of cervical spinal cord, initial encounter: Secondary | ICD-10-CM

## 2021-07-23 NOTE — Therapy (Signed)
?OUTPATIENT PHYSICAL THERAPY TREATMENT NOTE ? ? ?Patient Name: Meredith Roach ?MRN: [id number] ?DOB:July 08, 1940, 81 y.o., female ?Today's Date: 07/26/2021 ? ?PCP: Rusty Aus, MD ?REFERRING PROVIDER: [name], MD ? ?END OF SESSION:  ? PT End of Session - 07/26/21 0736   ? ? Visit Number 87   ? Number of Visits 92   ? Date for PT Re-Evaluation 06/12/21   ? Authorization Time Period Cert 09/18/87-3/81/01, Auth 07/21/21-08/27/21   ? Authorization - Visit Number 2   ? Authorization - Number of Visits 12   ? Progress Note Due on Visit 80   ? PT Start Time 1149   ? PT Stop Time 1235   ? PT Time Calculation (min) 46 min   ? Equipment Utilized During Treatment Gait belt   Pt uses power wheelchair, clinic's front-wheeled walker utilized for gait  ? Activity Tolerance Patient tolerated treatment well   ? Behavior During Therapy [facility] for tasks assessed/performed   ? ?  ?  ? ?  ? ? ?Past Medical History:  ?Diagnosis Date  ? Arthritis   ? GERD (gastroesophageal reflux disease)   ? Gout   ? Neuromuscular disorder ([place])   ? Pre-diabetes   ? ?Past Surgical History:  ?Procedure Laterality Date  ? ANTERIOR CERVICAL DECOMPRESSION/DISCECTOMY FUSION 4 LEVELS N/A 08/28/2019  ? Procedure: ANTERIOR CERVICAL DECOMPRESSION/DISCECTOMY FUSION CERVICALTHREE-FOUR CERVICAL,FOUR-FIVE,CERVICAL FIVE-SIX,CERVICAL SIX-SEVEN.;  Surgeon: [name], MD;  Location: [place];  Service: Neurosurgery;  Laterality: N/A;  ANTERIOR  ? BACK SURGERY    ? CATARACT EXTRACTION W/PHACO Right 03/31/2018  ? Procedure: CATARACT EXTRACTION PHACO AND INTRAOCULAR LENS PLACEMENT (IOC);  Surgeon: [name], MD;  Location: [facility];  Service: Ophthalmology;  Laterality: Right;  [place] 00:39.7 ?CDE 4.35 ?Fluid Pack Lot # [id number]  ? CHOLECYSTECTOMY  1995  ? COLONOSCOPY WITH PROPOFOL N/A 06/07/2017  ? Procedure: COLONOSCOPY WITH PROPOFOL;  Surgeon: [name], MD;  Location: [facility];  Service: Endoscopy;  Laterality: N/A;  ? ESOPHAGOGASTRODUODENOSCOPY (EGD) WITH  PROPOFOL N/A 06/07/2017  ? Procedure: ESOPHAGOGASTRODUODENOSCOPY (EGD) WITH PROPOFOL;  Surgeon: [name], MD;  Location: [facility]COPY;  Service: Endoscopy;  Laterality: N/A;  ? IR CATHETER TUBE CHANGE  05/10/2020  ? REDUCTION MAMMAPLASTY Bilateral 1994  ? ?Patient Active Problem List  ? Diagnosis Date Noted  ? Generalized anxiety disorder 01/24/2021  ? Suspected deep tissue injury 10/25/2020  ? 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 Spasticity 11/27/2019  ? Hyperkalemia   ? Steroid-induced hyperglycemia   ? Prediabetes   ? Neurogenic bowel   ? Neurogenic bladder   ? Neuropathic pain   ? Quadriplegia ([place]) 08/31/2019  ? S/P cervical spinal fusion 08/28/2019  ? 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 Radiculopathy 10/21/2016  ? ? ?  ?REFERRING DIAG:  ?R25.2 (ICD-10-CM) - Spasticity  ?G82.50 (ICD-10-CM) - Quadriplegia ([place])  ?Z99.3 (ICD-10-CM) - Wheelchair dependence  ?  ?  ?THERAPY DIAG:  ?Muscle weakness (generalized) ?  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  ?Difficulty in walking, not elsewhere classified ?  ?PERTINENT HISTORY: Patient is a 81 year old female s/p incomplete SCI. She had incomplete SCI following fall - C4 [place] D (DOI: 08/26/19). Primary activity limitations c difficulty walking, transferring, and completing independent functional mobility following incomplete SCI. She has undergone home health therapy for about one year. She and her husband reports she was able to transfer and ambulate with walker in home with  PT walking beside her. Pt has practiced with front-wheel walker at home. 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 Patient reports using steady transfer aid for performing transfers chair to bed and W/C to standard chair. Her husband states that she is able to stand with steady device and can stand with her legs against the bed. Pt has ramp to get in her home for her power wheelchair. Patient  lives in one level home. Pt uses tub bench that she can utilize to slide into shower; her daughter/husband assists with bathing. Pt has Lucianne Lei that can accommodate her power chair at this time. Pt has concrete driveway to get up to her ramp. Pt has small threshold to get into her home - husband utilizes cardboard to get over threshold.  ?  ?PRECAUTIONS: Fall risk, indwelling Foley catheter ?  ?SUBJECTIVE: Patient reports no new complaints at arrival to PT. She reports notable tightness in left lower extremity and working on dorsiflexion stretch with her L foot - she feels that this does help. Pt is continuing with medical management for neuralgia. She feels that getting up and moving helps her symptoms.  ?  ?  ?PAIN:  ?Are you having pain? No ?-pt attests to tightness in L hand and her L lower limb ?  ?  ?  ?  ?  ?TREATMENT TODAY ?  ?  ?Patient independently managed components of wheelchair to initiate session including navigating lobby/gym and reversing into location adjacent to low mat: pt independently doffed W/C safety belt and moving hinged footrest. ModA required with donning gait belt due to decreased motor control and spasticity in L upper limb. Pt able to independently manage catheter drainage bag once removed from hook under power chair seat  ?  ?  ?  ?Therapeutic Activities: ?  ?Ambulate laps in gym x 1 with PT on patient's L side with intermittent contact guard; verbal cueing for foot clearance as needed   ? ?Bed mobility task practice: bilateral rolling with no external device to assist. Trunk lift in sidelying R and L for 2x15 repetitions. Left rolling x 1 with use of dowel held by therapist for patient to pull with RUE. Sitting to supine and supine to sit performed x 1 (patient able to lift RLE to edge of low mat with supervision only during sitting to supine transfer, MaxA for bringing L lower limb to low mat; MaxA required to complete supine to sit following trunk lift onto L elbow with lower limbs off  of edge of mat)  ? ?Hooklying hip flexion and external rotation; performed x20 with bilateral LE (LLE is significantly more fatigable)  ?  ? ?  ?  ?  ?  ?*not today* ?In // bars: Consecutive step up/down with 3 consecutive Airex pads; 2 with LLE leading, 1 with RLE leading; completed one repetition only today due to significant motor fatigue following re-assessment ?Task practice for supine to sit with use of RUE assist to pull from sidelying into sitting, R hand pulling on dowel held by therapist  ?Ambulation over uneven ground simulated with large exercise mat lying on top of various-size ankle weights; 4x D/B ?  ?  ?  ?  ?Neuro Re-Ed:  ?  ?Bridging with tactile cueing and verbal cueing for clearing hips from low mat with patient education on importance of functional bridge for performance of scooting and bed transfers; 2x15 ? ? ?Standing in [place] with low mat behind patient, raised tray table in front of  walker; Reaching and grasping task using clothes pins with varying resistance; moving from flat surface on tray table to adjacent container; x 5 minutes ?  ?  ?Standing and seated rest breaks required intermittently throughout session due to motor fatigue.  ?  ?  ?Pt completed ModI pivot and ModI transfer stand to sit in power chair at end of session, no assistance required for buckling seatbelt at end of session; patient dependent with placement of indwelling foley catheter bag onto hook under her W/C seat. Pt IND with footrest placement  ?  ?  ?  ?*not today* ?Supine alternating march, hip flexion to 90 deg in hooklying; 2x15 alternating for proximal core control with distal lower limb management in lying as needed for positioning in bed and bed transfers ?Seated lateral step over 6-inch hurdle to simulate clearing each lower limb during bed and car tranfers; x10 over and back with bilateral LE ?Consecutive step-over with Tband; 4 Tbands lying flat on floor; step-to pattern with alternating leading lower  extremity; x4 D/B ?Sidestep over 4 Tbands flat on floor 3x D/B with CGA from therapist ?Performed with FWW in front, low mat behind patient, therapist closely supervising ?Reaching to various targets on ba

## 2021-07-26 ENCOUNTER — Encounter: Payer: Self-pay | Admitting: Physical Therapy

## 2021-07-28 ENCOUNTER — Ambulatory Visit: Payer: Medicare PPO | Admitting: Physical Therapy

## 2021-07-28 ENCOUNTER — Encounter: Payer: Self-pay | Admitting: Physical Therapy

## 2021-07-28 DIAGNOSIS — R262 Difficulty in walking, not elsewhere classified: Secondary | ICD-10-CM

## 2021-07-28 DIAGNOSIS — M6281 Muscle weakness (generalized): Secondary | ICD-10-CM | POA: Diagnosis not present

## 2021-07-28 DIAGNOSIS — S14123A Central cord syndrome at C3 level of cervical spinal cord, initial encounter: Secondary | ICD-10-CM

## 2021-07-28 NOTE — Therapy (Signed)
?OUTPATIENT PHYSICAL THERAPY TREATMENT NOTE ? ? ?Patient Name: Meredith Roach ?MRN: [id number] ?DOB:06-13-1940, 81 y.o., female ?Today's Date: 07/29/2021 ? ?PCP: Rusty Aus, MD ?REFERRING PROVIDER: [name], MD ? ?END OF SESSION:  ? PT End of Session - 07/29/21 0858   ? ? Visit Number 75   ? Number of Visits 92   ? Date for PT Re-Evaluation 06/12/21   ? Authorization Time Period Cert 05/19/83-2/77/82, Auth 07/21/21-08/27/21   ? Authorization - Visit Number 3   ? Authorization - Number of Visits 12   ? Progress Note Due on Visit 80   ? PT Start Time 1142   ? PT Stop Time 1232   ? PT Time Calculation (min) 50 min   ? Equipment Utilized During Treatment Gait belt   Pt uses power wheelchair, clinic's front-wheeled walker utilized for gait  ? Activity Tolerance Patient tolerated treatment well   ? Behavior During Therapy [facility] for tasks assessed/performed   ? ?  ?  ? ?  ? ? ?Past Medical History:  ?Diagnosis Date  ? Arthritis   ? GERD (gastroesophageal reflux disease)   ? Gout   ? Neuromuscular disorder ([place])   ? Pre-diabetes   ? ?Past Surgical History:  ?Procedure Laterality Date  ? ANTERIOR CERVICAL DECOMPRESSION/DISCECTOMY FUSION 4 LEVELS N/A 08/28/2019  ? Procedure: ANTERIOR CERVICAL DECOMPRESSION/DISCECTOMY FUSION CERVICALTHREE-FOUR CERVICAL,FOUR-FIVE,CERVICAL FIVE-SIX,CERVICAL SIX-SEVEN.;  Surgeon: [name], MD;  Location: [place];  Service: Neurosurgery;  Laterality: N/A;  ANTERIOR  ? BACK SURGERY    ? CATARACT EXTRACTION W/PHACO Right 03/31/2018  ? Procedure: CATARACT EXTRACTION PHACO AND INTRAOCULAR LENS PLACEMENT (IOC);  Surgeon: [name], MD;  Location: [facility];  Service: Ophthalmology;  Laterality: Right;  [place] 00:39.7 ?CDE 4.35 ?Fluid Pack Lot # [id number]  ? CHOLECYSTECTOMY  1995  ? COLONOSCOPY WITH PROPOFOL N/A 06/07/2017  ? Procedure: COLONOSCOPY WITH PROPOFOL;  Surgeon: [name], MD;  Location: [facility] ENDOSCOPY;  Service: Endoscopy;  Laterality: N/A;  ? ESOPHAGOGASTRODUODENOSCOPY (EGD) WITH  PROPOFOL N/A 06/07/2017  ? Procedure: ESOPHAGOGASTRODUODENOSCOPY (EGD) WITH PROPOFOL;  Surgeon: [name], MD;  Location[facility] ENDOSCOPY;  Service: Endoscopy;  Laterality: N/A;  ? IR CATHETER TUBE CHANGE  05/10/2020  ? REDUCTION MAMMAPLASTY Bilateral 1994  ? ?Patient Active Problem List  ? Diagnosis Date Noted  ? Generalized anxiety disorder 01/24/2021  ? Suspected deep tissue injury 10/25/2020  ? 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 Spasticity 11/27/2019  ? Hyperkalemia   ? Steroid-induced hyperglycemia   ? Prediabetes   ? Neurogenic bowel   ? Neurogenic bladder   ? Neuropathic pain   ? Quadriplegia ([place]) 08/31/2019  ? S/P cervical spinal fusion 08/28/2019  ? 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 Radiculopathy 10/21/2016  ? ? ?REFERRING DIAG:  ?R25.2 (ICD-10-CM) - Spasticity  ?G82.50 (ICD-10-CM) - Quadriplegia ([place])  ?Z99.3 (ICD-10-CM) - Wheelchair dependence  ?  ?  ?THERAPY DIAG:  ?Muscle weakness (generalized) ?  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  ?Difficulty in walking, not elsewhere classified ?  ?PERTINENT HISTORY: Patient is a 81 year old female s/p incomplete SCI. She had incomplete SCI following fall - C4 [place] D (DOI: 08/26/19). Primary activity limitations c difficulty walking, transferring, and completing independent functional mobility following incomplete SCI. She has undergone home health therapy for about one year. She and her husband reports she was able to transfer and ambulate with walker in home with PT walking  beside her. Pt has practiced with front-wheel walker at home. 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 Patient reports using steady transfer aid for performing transfers chair to bed and W/C to standard chair. Her husband states that she is able to stand with steady device and can stand with her legs against the bed. Pt has ramp to get in her home for her power wheelchair. Patient  lives in one level home. Pt uses tub bench that she can utilize to slide into shower; her daughter/husband assists with bathing. Pt has Lucianne Lei that can accommodate her power chair at this time. Pt has concrete driveway to get up to her ramp. Pt has small threshold to get into her home - husband utilizes cardboard to get over threshold.  ?  ?PRECAUTIONS: Fall risk, indwelling Foley catheter ?  ?SUBJECTIVE: Patient reports ongoing L upper extremity and lower extremity tightness and difficulty with extending L MCPs in spite of recent Botox injection. She reports continued work on upper limb and digit mobility at home. Pt reports feeling better in regard to sensation of tightness when she is on the move/walking.  ?  ?  ?PAIN:  ?Are you having pain? No ?-pt attests to tightness in L hand and her L lower limb ?  ? ?  ?TREATMENT TODAY ?  ?  ?Patient independently managed components of wheelchair to initiate session including navigating lobby/gym and reversing into location adjacent to low mat: pt independently doffed W/C safety belt and moved hinged footrest. MaxA required with donning gait belt due to decreased motor control and spasticity in L upper limb. Pt able to independently manage catheter drainage bag once removed from hook under power chair seat. ?  ?  ?  ?Therapeutic Activities: ?  ?Ambulate laps in gym x 2 with PT on patient's L side with intermittent contact guard; verbal cueing for foot clearance as needed ? ?  ?*next visit* ?In // bars: Consecutive step up/down with 3 consecutive Airex pads; 2 with LLE leading, 1 with RLE leading; completed one repetition only today due to significant motor fatigue following re-assessment ?  ?  ?*not today* ?Ambulation over uneven ground simulated with large exercise mat lying on top of various-size ankle weights; 4x D/B ?Task practice for supine to sit with use of RUE assist to pull from sidelying into sitting, R hand pulling on dowel held by therapist  ?  ?  ?  ?Neuro Re-Ed -  for lower limb equilibrium and non-equilibrium coordination required for toe clearance and obstacle negotiation, motor control training for reaching/grasping, fine motor control drills to improve ability to perform fine motor grasping with L hand ? ?Cross crawl in sitting on edge of low mat; 2x20 with alternating UE/LE - for core control in upright position as needed for functional reaching, stepping, and proximal stability demands during lower limb lifting ? ?4-square step with 2 Tbands lying in 4-square pattern on floor; completed 2x clockwise stepping into each square with both feet   ?  ?Consecutive step-over with 4 ankle weights lying flat on floor (2 weights with 5-inch step length and 2 weights with 8-inch step length); step-to pattern with alternating leading lower extremity; x1.5 D/B (did not complete second return trip due to significant motor fatigue and difficulty clearing ankle weights) ?  ?Reaching and grasping task using clothes pins, bolts, and various size/texture balls; moving from flat surface on tray table to adjacent container; x 4 minutes for varying degrees of fine motor control and L  upper limb prehension ?  ? ?  ?  ?Standing and seated rest breaks required intermittently throughout session due to motor fatigue.  ?  ?  ?Pt independently transferred stand to sit in power chair at end of session, requires assistance for buckling seatbelt at end of session and placement of indwelling foley catheter onto hook. Pt IND with footrest placement  ?  ?  ?  ?*not today* ?Sidestep over 4 Tbands flat on floor 3x D/B with CGA from therapist ?Performed with FWW in front, low mat behind patient, therapist closely supervising ?Reaching to various targets on backside of mirror (9 sticky notes placed on flat surface), touching all targets with L upper limb and R upper limb; x 5 minutes ?Reaching and grasping task using varying-size balls; moving from on tray to adjacent tray on high table; x 5 minutes   ?Standing theraband chest press to imitate pushing open doors (performed with low mat posterior to patient with pt standing in walker): Green Tband; 2x10 requiring loop tied for success holding with LUE due t

## 2021-07-30 ENCOUNTER — Encounter: Payer: Self-pay | Admitting: Physical Therapy

## 2021-07-30 ENCOUNTER — Ambulatory Visit: Payer: Medicare PPO | Admitting: Physical Therapy

## 2021-07-30 DIAGNOSIS — M6281 Muscle weakness (generalized): Secondary | ICD-10-CM | POA: Diagnosis not present

## 2021-07-30 DIAGNOSIS — S14123A Central cord syndrome at C3 level of cervical spinal cord, initial encounter: Secondary | ICD-10-CM

## 2021-07-30 DIAGNOSIS — R262 Difficulty in walking, not elsewhere classified: Secondary | ICD-10-CM

## 2021-07-30 NOTE — Therapy (Signed)
?OUTPATIENT PHYSICAL THERAPY TREATMENT NOTE ? ? ?Patient Name: Meredith Roach ?MRN: [id number] ?DOB:21-Aug-1940, 81 y.o., female ?Today's Date: 07/30/2021 ? ?PCP: Rusty Aus, MD ?REFERRING PROVIDER: [name], MD ? ?END OF SESSION:  ? PT End of Session - 07/30/21 1137   ? ? Visit Number 19   ? Number of Visits 92   ? Date for PT Re-Evaluation 06/12/21   ? Authorization Time Period Cert 06/15/26-7/68/11, Auth 07/21/21-08/27/21   ? Authorization - Visit Number 4   ? Authorization - Number of Visits 12   ? Progress Note Due on Visit 80   ? PT Start Time 1141   ? PT Stop Time 1236   ? PT Time Calculation (min) 55 min   ? Equipment Utilized During Treatment Gait belt   Pt uses power wheelchair, clinic's front-wheeled walker utilized for gait  ? Activity Tolerance Patient tolerated treatment well   ? Behavior During Therapy [facility] for tasks assessed/performed   ? ?  ?  ? ?  ? ? ?Past Medical History:  ?Diagnosis Date  ? Arthritis   ? GERD (gastroesophageal reflux disease)   ? Gout   ? Neuromuscular disorder ([place])   ? Pre-diabetes   ? ?Past Surgical History:  ?Procedure Laterality Date  ? ANTERIOR CERVICAL DECOMPRESSION/DISCECTOMY FUSION 4 LEVELS N/A 08/28/2019  ? Procedure: ANTERIOR CERVICAL DECOMPRESSION/DISCECTOMY FUSION CERVICALTHREE-FOUR CERVICAL,FOUR-FIVE,CERVICAL FIVE-SIX,CERVICAL SIX-SEVEN.;  Surgeon: [name], MD;  Location: [place];  Service: Neurosurgery;  Laterality: N/A;  ANTERIOR  ? BACK SURGERY    ? CATARACT EXTRACTION W/PHACO Right 03/31/2018  ? Procedure: CATARACT EXTRACTION PHACO AND INTRAOCULAR LENS PLACEMENT (IOC);  Surgeon: [name], MD;  Location: [facility] ORS;  Service: Ophthalmology;  Laterality: Right;  [place] 00:39.7 ?CDE 4.35 ?Fluid Pack Lot # [id number]  ? CHOLECYSTECTOMY  1995  ? COLONOSCOPY WITH PROPOFOL N/A 06/07/2017  ? Procedure: COLONOSCOPY WITH PROPOFOL;  Surgeon: [name], MD;  Location: [facility] ENDOSCOPY;  Service: Endoscopy;  Laterality: N/A;  ? ESOPHAGOGASTRODUODENOSCOPY (EGD) WITH  PROPOFOL N/A 06/07/2017  ? Procedure: ESOPHAGOGASTRODUODENOSCOPY (EGD) WITH PROPOFOL;  Surgeon: [name], MD;  Location: [facility] ENDOSCOPY;  Service: Endoscopy;  Laterality: N/A;  ? IR CATHETER TUBE CHANGE  05/10/2020  ? REDUCTION MAMMAPLASTY Bilateral 1994  ? ?Patient Active Problem List  ? Diagnosis Date Noted  ? Generalized anxiety disorder 01/24/2021  ? Suspected deep tissue injury 10/25/2020  ? 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 Spasticity 11/27/2019  ? Hyperkalemia   ? Steroid-induced hyperglycemia   ? Prediabetes   ? Neurogenic bowel   ? Neurogenic bladder   ? Neuropathic pain   ? Quadriplegia ([place]) 08/31/2019  ? S/P cervical spinal fusion 08/28/2019  ? 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 Radiculopathy 10/21/2016  ? ?  ?  ?REFERRING DIAG:  ?R25.2 (ICD-10-CM) - Spasticity  ?G82.50 (ICD-10-CM) - Quadriplegia ([place])  ?Z99.3 (ICD-10-CM) - Wheelchair dependence  ?  ?  ?THERAPY DIAG:  ?Muscle weakness (generalized) ?  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  ?Difficulty in walking, not elsewhere classified ?  ?PERTINENT HISTORY: Patient is a 81 year old female s/p incomplete SCI. She had incomplete SCI following fall - C4 [place] D (DOI: 08/26/19). Primary activity limitations c difficulty walking, transferring, and completing independent functional mobility following incomplete SCI. She has undergone home health therapy for about one year. She and her husband reports she was able to transfer and ambulate with walker in home  with PT walking beside her. Pt has practiced with front-wheel walker at home. 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 Patient reports using steady transfer aid for performing transfers chair to bed and W/C to standard chair. Her husband states that she is able to stand with steady device and can stand with her legs against the bed. Pt has ramp to get in her home for her power wheelchair.  Patient lives in one level home. Pt uses tub bench that she can utilize to slide into shower; her daughter/husband assists with bathing. Pt has Lucianne Lei that can accommodate her power chair at this time. Pt has concrete driveway to get up to her ramp. Pt has small threshold to get into her home - husband utilizes cardboard to get over threshold.  ?  ?PRECAUTIONS: Fall risk, indwelling Foley catheter ?  ?SUBJECTIVE: Patient reports her lower limb tightness is better compared to earlier this week. She feels that her Botox injections may not have been as effective for her L hand; she reports notable difficulty with extending her L fingers. Pt is compliant with mobility work discussed in PT as well as drills previously given by OT; pt uses device for low-load long duration stretch at home to attain digit extension.  ?  ?  ?PAIN:  ?Are you having pain? No ?-pt attests to tightness in L hand and her L lower limb ?  ?  ?  ?  ?  ?TREATMENT TODAY ?  ?  ?Patient independently managed components of wheelchair to initiate session including navigating lobby/gym and reversing into location adjacent to low mat: pt independently doffed W/C safety belt and moving hinged footrest. ModA required with doffing/donning gait belt due to decreased motor control and spasticity in L upper limb. Pt able to independently manage catheter drainage bag once removed from hook under power chair seat  ?  ?  ?  ?Therapeutic Activities - task practice with cueing and task modification prn to improve ability to perform ambulation, transfers, bed mobility, and obstacle negotiation ?  ?Ambulate laps in gym x 2 with PT on patient's L side with intermittent contact guard; verbal cueing for foot clearance as needed   ?  ?Bed mobility task practice: bilateral complete rolling with no external device to assist x 1. Performed bilateral rolling from supine to sidelying in partial range, x15 25% ROM, x15 50% ROM, x10 75% ROM ? ?Resisted eccentrics with pt resisting  push from sidelying to supine, therapists hands on ribcage and pelvis; x25 on each side ?  ?Seated lateral step over 5-lb dumbbell to simulate clearing each lower limb during bed and car tranfers; x20 on each side; no resistance for L lower limb, 3-lb ankle weight for R lower limb ?  ?Sidelying to sit transfer task practice from wedge to decrease ROM; performed x 3 with significant difficulty lifting trunk from wedge  ?  ?  ?Standing and seated rest breaks required intermittently throughout session due to motor fatigue.  ?  ?  ?Pt completed ModI pivot and ModI transfer stand to sit in power chair at end of session, no assistance required for buckling seatbelt at end of session; patient dependent with placement of indwelling foley catheter bag onto hook under her W/C seat. Pt IND with footrest placement  ?  ?  ?  ?  ?*not today* ?Hooklying hip flexion and external rotation; performed x20 with bilateral LE (LLE is significantly more fatigable)  ?In // bars: Consecutive step up/down with 3 consecutive  Airex pads; 2 with LLE leading, 1 with RLE leading; completed one repetition only today due to significant motor fatigue following re-assessment ?Task practice for supine to sit with use of RUE assist to pull from sidelying into sitting, R hand pulling on dowel held by therapist  ?Ambulation over uneven ground simulated with large exercise mat lying on top of various-size ankle weights; 4x D/B ?  ?  ?  ?  ?Neuro Re-Ed:  ?  ?Seated cross crawl; 2x15 alternating R/L for trunk control and core strengthening with distal active mobility as needed for transfers and lower limb management during ADLs ? ? ?*next visit* ?Standing in [place] with low mat behind patient, raised tray table in front of walker; Reaching and grasping task using clothes pins with varying resistance; moving from flat surface on tray table to adjacent container; x 5 minutes ?  ?  ?*not today* ?Bridging with tactile cueing and verbal cueing for clearing hips  from low mat with patient education on importance of functional bridge for performance of scooting and bed transfers; 2x15 ?Supine alternating march, hip flexion to 90 deg in hooklying; 2x15 alternating for pr

## 2021-07-31 ENCOUNTER — Other Ambulatory Visit: Payer: Self-pay | Admitting: Physical Medicine and Rehabilitation

## 2021-08-03 NOTE — Progress Notes (Signed)
Suprapubic Cath Change ? ?Patient is present today for a suprapubic catheter change due to urinary retention.  8 ml of water was drained from the balloon, a 16 FR foley cath was removed from the tract with out difficulty.  Site was cleaned and prepped in a sterile fashion with betadine.  A 16 FR foley cath was replaced into the tract no complications were noted. Urine return was noted, 10 ml of sterile water was inflated into the balloon and a bag was attached for drainage.  Patient tolerated well. A night bag was given to patient and proper instruction was given on how to switch bags.   ? ?Performed by: Zara Council, PA-C ? ?Follow up: Return in about 1 month (around 09/03/2021) for SPT exchange . ? ? ? I,Kailey Littlejohn,acting as a Education administrator for [employer], PA-C.,have documented all relevant documentation on the behalf of [place], PA-C,as directed by  [facility], PA-C while in the presence of [place], PA-C. ? ?I have reviewed the above documentation for accuracy and completeness, and I agree with the above.   ? ?Zara Council, PA-C  ?

## 2021-08-04 ENCOUNTER — Ambulatory Visit (INDEPENDENT_AMBULATORY_CARE_PROVIDER_SITE_OTHER): Payer: Medicare PPO | Admitting: Urology

## 2021-08-04 ENCOUNTER — Ambulatory Visit: Payer: Medicare PPO | Admitting: Physical Therapy

## 2021-08-04 ENCOUNTER — Encounter: Payer: Self-pay | Admitting: Urology

## 2021-08-04 VITALS — Ht 63.0 in | Wt 200.0 lb

## 2021-08-04 DIAGNOSIS — M6281 Muscle weakness (generalized): Secondary | ICD-10-CM | POA: Diagnosis not present

## 2021-08-04 DIAGNOSIS — Z466 Encounter for fitting and adjustment of urinary device: Secondary | ICD-10-CM

## 2021-08-04 DIAGNOSIS — Z9359 Other cystostomy status: Secondary | ICD-10-CM

## 2021-08-04 DIAGNOSIS — R339 Retention of urine, unspecified: Secondary | ICD-10-CM

## 2021-08-04 DIAGNOSIS — R262 Difficulty in walking, not elsewhere classified: Secondary | ICD-10-CM

## 2021-08-04 DIAGNOSIS — S14123A Central cord syndrome at C3 level of cervical spinal cord, initial encounter: Secondary | ICD-10-CM

## 2021-08-04 NOTE — Therapy (Signed)
?OUTPATIENT PHYSICAL THERAPY TREATMENT NOTE/GOAL UPDATE AND DISCHARGE SUMMARY ? ? ?Patient Name: Meredith Roach ?MRN: [id number] ?DOB:[birth year], 81 y.o., female ?Today's Date: 08/04/2021 ? ?PCP: Rusty Aus, MD ?REFERRING PROVIDER: [name], MD ? ?END OF SESSION:  ? PT End of Session - 08/04/21 1155   ? ? Visit Number 52   ? Number of Visits 92   ? Date for PT Re-Evaluation 06/12/21   ? Authorization Time Period Cert 10/17/80-08/04/51, Auth 07/21/21-08/27/21   ? Authorization - Visit Number 5   ? Authorization - Number of Visits 12   ? Progress Note Due on Visit 80   ? PT Start Time 1150   ? PT Stop Time 1236   ? PT Time Calculation (min) 46 min   ? Equipment Utilized During Treatment Gait belt   Pt uses power wheelchair, clinic's front-wheeled walker utilized for gait  ? Activity Tolerance Patient tolerated treatment well   ? Behavior During Therapy [facility] for tasks assessed/performed   ? ?  ?  ? ?  ? ? ?Past Medical History:  ?Diagnosis Date  ? Arthritis   ? GERD (gastroesophageal reflux disease)   ? Gout   ? Neuromuscular disorder ([place])   ? Pre-diabetes   ? ?Past Surgical History:  ?Procedure Laterality Date  ? ANTERIOR CERVICAL DECOMPRESSION/DISCECTOMY FUSION 4 LEVELS N/A 08/28/2019  ? Procedure: ANTERIOR CERVICAL DECOMPRESSION/DISCECTOMY FUSION CERVICALTHREE-FOUR CERVICAL,FOUR-FIVE,CERVICAL FIVE-SIX,CERVICAL SIX-SEVEN.;  Surgeon: [name], MD;  Location: [place];  Service: Neurosurgery;  Laterality: N/A;  ANTERIOR  ? BACK SURGERY    ? CATARACT EXTRACTION W/PHACO Right 03/31/2018  ? Procedure: CATARACT EXTRACTION PHACO AND INTRAOCULAR LENS PLACEMENT (IOC);  Surgeon: [name], MD;  Location: [facility] ORS;  Service: Ophthalmology;  Laterality: Right;  [place] 00:39.7 ?CDE 4.35 ?Fluid Pack Lot # [id number]  ? CHOLECYSTECTOMY  1995  ? COLONOSCOPY WITH PROPOFOL N/A 06/07/2017  ? Procedure: COLONOSCOPY WITH PROPOFOL;  Surgeon: [name], MD;  Location: [facility] ENDOSCOPY;  Service: Endoscopy;  Laterality: N/A;  ?  ESOPHAGOGASTRODUODENOSCOPY (EGD) WITH PROPOFOL N/A 06/07/2017  ? Procedure: ESOPHAGOGASTRODUODENOSCOPY (EGD) WITH PROPOFOL;  Surgeon: [name], MD;  Location: [facility] ENDOSCOPY;  Service: Endoscopy;  Laterality: N/A;  ? IR CATHETER TUBE CHANGE  05/10/2020  ? REDUCTION MAMMAPLASTY Bilateral 1994  ? ?Patient Active Problem List  ? Diagnosis Date Noted  ? Generalized anxiety disorder 01/24/2021  ? Suspected deep tissue injury 10/25/2020  ? 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 Spasticity 11/27/2019  ? Hyperkalemia   ? Steroid-induced hyperglycemia   ? Prediabetes   ? Neurogenic bowel   ? Neurogenic bladder   ? Neuropathic pain   ? Quadriplegia ([place]) 08/31/2019  ? S/P cervical spinal fusion 08/28/2019  ? 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 Radiculopathy 10/21/2016  ? ?  ?REFERRING DIAG:  ?R25.2 (ICD-10-CM) - Spasticity  ?G82.50 (ICD-10-CM) - Quadriplegia ([place])  ?Z99.3 (ICD-10-CM) - Wheelchair dependence  ?  ?  ?THERAPY DIAG:  ?Muscle weakness (generalized) ?  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  ?Difficulty in walking, not elsewhere classified ?  ?PERTINENT HISTORY: Patient is a 81 year old female s/p incomplete SCI. She had incomplete SCI following fall - C4 [place] D (DOI: 08/26/19). Primary activity limitations c difficulty walking, transferring, and completing independent functional mobility following incomplete SCI. She has undergone home health therapy for about one year. She and her husband reports she was able to transfer and ambulate with walker  in home with PT walking beside her. Pt has practiced with front-wheel walker at home. 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 Patient reports using steady transfer aid for performing transfers chair to bed and W/C to standard chair. Her husband states that she is able to stand with steady device and can stand with her legs against the bed. Pt has ramp to get in her  home for her power wheelchair. Patient lives in one level home. Pt uses tub bench that she can utilize to slide into shower; her daughter/husband assists with bathing. Pt has Lucianne Lei that can accommodate her power chair at this time. Pt has concrete driveway to get up to her ramp. Pt has small threshold to get into her home - husband utilizes cardboard to get over threshold.  ?  ?PRECAUTIONS: Fall risk, indwelling Foley catheter ?  ?SUBJECTIVE: Patient reports L ankle tightness that does improve with gait/movement. She reports ongoing difficulty with extending digits of her L hand following recent Botox injections. Patient feels that she has progressed well. She reports doing well with most transfers from EOB and from her power chair. She uses stick for performing rolling and sidelying to sitting transfers with her husband assisting as needed to complete sidelying to sit. Pt feels she has made good progress overall compared to outset of PT in August 2021. Patient's husband does voice concerns with completing adequate exercise and activities at home, feeling that the patient does perform more when coming to outpatient PT.  ?  ?  ?PAIN:  ?Are you having pain? No ?-pt attests to tightness in L hand and her L lower limb ?  ?  ?  ?  ?  ?TREATMENT TODAY ?  ?  ?Patient independently managed components of wheelchair to initiate session including navigating lobby/gym and reversing into location adjacent to low mat: pt independently doffed W/C safety belt and moving hinged footrest. ModA required with doffing/donning gait belt due to decreased motor control and spasticity in L upper limb. Pt able to independently manage catheter drainage bag once removed from hook under power chair seat  ?  ?  ?  ?Therapeutic Activities - task practice with cueing and task modification prn to improve ability to perform ambulation, transfers, bed mobility, and obstacle negotiation ? ?Re-assessment performed (see Objective and updated Goal section  below) ? ? ?Ambulate laps in gym x 2 with PT on patient's L side with intermittent contact guard; verbal cueing for foot clearance as needed   ?  ?Bed mobility task practice: bilateral complete rolling with no external device to assist x 1. Performed L rolling from supine to sidelying in partial range, x10 50% ROM, x10 75% ROM, x5 full ROM ?  ?Resisted eccentrics with pt resisting push from sidelying to supine, therapists hands on ribcage and pelvis; x25 for L rolling ?  ? ?  ?Standing and seated rest breaks required intermittently throughout session due to motor fatigue.  ?  ?  ?Pt completed ModI pivot and ModI transfer stand to sit in power chair at end of session, no assistance required for buckling seatbelt at end of session; patient dependent with placement of indwelling foley catheter bag onto hook under her W/C seat. Pt IND with footrest placement  ?  ? ? ?PATIENT EDUCATION: completed extensive overview of exercises discussed for home program to date as well as previously established OT exercises to maintain along with those given with PT. Updated and reviewed [facility] home exercise program and advised continue  daily walking with FWW. Discussed plateau in progress on end of certification today and transition to maintenance phase at this time. Answered pt and spouse's questions in regard to current POC.  ? ?  ? ?  ?  ?  ?*not today* ?Sidelying to sit transfer task practice from wedge to decrease ROM; performed x 3 with significant difficulty lifting trunk from wedge  ?Seated lateral step over 5-lb dumbbell to simulate clearing each lower limb during bed and car tranfers; x20 on each side; no resistance for L lower limb, 3-lb ankle weight for R lower limb ?Hooklying hip flexion and external rotation; performed x20 with bilateral LE (LLE is significantly more fatigable)  ?In // bars: Consecutive step up/down with 3 consecutive Airex pads; 2 with LLE leading, 1 with RLE leading; completed one repetition only  today due to significant motor fatigue following re-assessment ?Task practice for supine to sit with use of RUE assist to pull from sidelying into sitting, R hand pulling on dowel held by therapist  ?Ambulati

## 2021-08-05 ENCOUNTER — Encounter: Payer: Self-pay | Admitting: Physical Therapy

## 2021-08-06 ENCOUNTER — Encounter: Payer: Medicare PPO | Admitting: Physical Therapy

## 2021-08-11 ENCOUNTER — Encounter: Payer: Medicare PPO | Admitting: Physical Therapy

## 2021-08-13 ENCOUNTER — Ambulatory Visit: Payer: Medicare PPO | Admitting: Physical Therapy

## 2021-08-18 ENCOUNTER — Encounter: Payer: Medicare PPO | Admitting: Physical Therapy

## 2021-08-20 ENCOUNTER — Encounter: Payer: Medicare PPO | Admitting: Physical Therapy

## 2021-08-25 ENCOUNTER — Ambulatory Visit: Payer: Medicare PPO | Admitting: Urology

## 2021-08-25 ENCOUNTER — Encounter: Payer: Medicare PPO | Admitting: Physical Therapy

## 2021-08-27 ENCOUNTER — Encounter: Payer: Medicare PPO | Admitting: Physical Therapy

## 2021-08-29 ENCOUNTER — Ambulatory Visit: Payer: Medicare PPO | Admitting: Physical Medicine and Rehabilitation

## 2021-09-01 ENCOUNTER — Ambulatory Visit: Payer: Medicare PPO | Admitting: Urology

## 2021-09-01 ENCOUNTER — Encounter: Payer: Self-pay | Admitting: Urology

## 2021-09-01 ENCOUNTER — Encounter: Payer: Medicare PPO | Attending: Physical Medicine and Rehabilitation | Admitting: Physical Medicine and Rehabilitation

## 2021-09-01 ENCOUNTER — Telehealth: Payer: Self-pay

## 2021-09-01 ENCOUNTER — Encounter: Payer: Self-pay | Admitting: Physical Medicine and Rehabilitation

## 2021-09-01 VITALS — BP 112/73 | HR 79 | Ht 63.0 in

## 2021-09-01 DIAGNOSIS — Z79891 Long term (current) use of opiate analgesic: Secondary | ICD-10-CM | POA: Diagnosis present

## 2021-09-01 DIAGNOSIS — R339 Retention of urine, unspecified: Secondary | ICD-10-CM | POA: Diagnosis not present

## 2021-09-01 DIAGNOSIS — Z5181 Encounter for therapeutic drug level monitoring: Secondary | ICD-10-CM | POA: Insufficient documentation

## 2021-09-01 DIAGNOSIS — G8250 Quadriplegia, unspecified: Secondary | ICD-10-CM | POA: Insufficient documentation

## 2021-09-01 DIAGNOSIS — M5417 Radiculopathy, lumbosacral region: Secondary | ICD-10-CM | POA: Diagnosis present

## 2021-09-01 DIAGNOSIS — M792 Neuralgia and neuritis, unspecified: Secondary | ICD-10-CM | POA: Diagnosis present

## 2021-09-01 DIAGNOSIS — Z993 Dependence on wheelchair: Secondary | ICD-10-CM | POA: Diagnosis present

## 2021-09-01 DIAGNOSIS — G894 Chronic pain syndrome: Secondary | ICD-10-CM | POA: Insufficient documentation

## 2021-09-01 DIAGNOSIS — Z9359 Other cystostomy status: Secondary | ICD-10-CM

## 2021-09-01 MED ORDER — TRAMADOL HCL 50 MG PO TABS
50.0000 mg | ORAL_TABLET | Freq: Four times a day (QID) | ORAL | 0 refills | Status: DC | PRN
Start: 2021-09-01 — End: 2021-09-12

## 2021-09-01 MED ORDER — LIDOCAINE 5 % EX OINT: 1.0000 "application " | TOPICAL_OINTMENT | CUTANEOUS | 5 refills | Status: DC | PRN

## 2021-09-01 MED ORDER — DULOXETINE HCL 60 MG PO CPEP: 60.0000 mg | ORAL_CAPSULE | Freq: Every day | ORAL | 5 refills | Status: DC

## 2021-09-01 NOTE — Patient Instructions (Signed)
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 And worsening spasticity.   Injury was 08/23/19! Here for f/u on SCI/spasticity, etc  Discussed Keppra for nerve pain. As well as lidocaine and Tramadol- for nerve pain control.   2. Decided on tramadol and Lidocaine- waiton keppra. - will get Opiate contract- and oral drug screen- since starting tramadol-  50 mg q6 hours prn OR 100 mg 2x/day(at least 6 hours apart) as needed- for neve pain. Kidney function OK. Cr 0.9 and BUN 19.   3. Also will send in Rx for lidocaine cream/ointment 5% up to 4x/day as needed- don't cover more than front of L leg from knee to foot/and foot/ankle at a time- a nickel sized dollop at a time-   4. Con't Baclofen, and Cymbalta, Paxil and Gabapentin.   5. Also hasn't really taken Buspar for anxiety since on Paxil.    6. Call me Thursday or Friday to let me know - if tramadol working. If so, I'll call in 1 month's supply.    7. Refilled Duloxetine 60 mg daily- 5 refills-    8. Has another appt for Botox early July 2023.    9. F/U 3 months- double appt- SCI/pain.

## 2021-09-01 NOTE — Telephone Encounter (Signed)
PA submitted to insurance through [employer]  PA Case: [id number], Status: Approved, Coverage Starts on: 04/13/2021 12:00:00 AM, Coverage Ends on: 04/12/2022 12:00:00 AM. Questions? Contact [phone].

## 2021-09-01 NOTE — Progress Notes (Signed)
Subjective:    Patient ID: Meredith Roach, female    DOB: July 20, 1940, 81 y.o.   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 And worsening spasticity.   Injury was 08/23/19! Here for f/u on SCI/spasticity, etc    07/17/21- Botox of LUE by Dr Letta Pate-  LEFT Brachioradialis 25 FCR0 FCU0 FDS50 FDP50 FPL0 Lumbricals 25 x 3=75 Waste 0U  Got Botox last 07/17/21- put it on a board to straighten out.  Can tell when time to get it done again.  Is index finger that's worse/curling more  Having more pain lately- around L foot /ankle and into L knee.  Burning, stinging/tight-  When sits with legs pulled back.   Was also swollen 2-3 days ago- was "1 inch bigger"- but got better with propping it up.   Changed Gabapentin to 1200 mg TID at last appointment-  Tolerating that dose OK.  Also, still on Cymbalta- also on Paxil 10 mg daily. So cannot increase Cymbalta.   Pain is worse at evening/night time.   Has been using horse lineament! Working pretty well.   Still has chronic Suprapubic catheter- - changed last today!   Using power w/c to get around.   Through with therapy- insurance "Cut her off".    Pain Inventory Average Pain 6 Pain Right Now 4 My pain is intermittent, burning, tingling, and aching  In the last 24 hours, has pain interfered with the following? General activity 0 Relation with others 0 Enjoyment of life 0 What TIME of day is your pain at its worst? evening and night Sleep (in general) Fair  Pain is worse with: sitting Pain improves with:  nothing helps Relief from Meds: 0  Family History  Problem Relation Age of Onset   Breast cancer Maternal Aunt    Breast cancer Paternal Aunt    CAD Father    Heart attack Father    Social History   Socioeconomic History   Marital status:  Married    Spouse name: Meredith Roach   Number of children: 2   Years of education: Not on file   Highest education level: Not on file  Occupational History   Not on file  Tobacco Use   Smoking status: Never    Passive exposure: Never   Smokeless tobacco: Never  Vaping Use   Vaping Use: Never used  Substance and Sexual Activity   Alcohol use: No   Drug use: No   Sexual activity: Not Currently  Other Topics Concern   Not on file  Social History Narrative   Lives with daughter   Social Determinants of Health   Financial Resource Strain: Not on file  Food Insecurity: Not on file  Transportation Needs: Not on file  Physical Activity: Not on file  Stress: Not on file  Social Connections: Not on file   Past Surgical History:  Procedure Laterality Date   ANTERIOR CERVICAL DECOMPRESSION/DISCECTOMY FUSION 4 LEVELS N/A 08/28/2019   Procedure: ANTERIOR CERVICAL DECOMPRESSION/DISCECTOMY FUSION CERVICALTHREE-FOUR CERVICAL,FOUR-FIVE,CERVICAL FIVE-SIX,CERVICAL SIX-SEVEN.;  Surgeon: [name], MD;  Location: [facility];  Service: Neurosurgery;  Laterality: N/A;  ANTERIOR   BACK SURGERY     CATARACT EXTRACTION W/PHACO Right 03/31/2018   Procedure: CATARACT EXTRACTION PHACO AND INTRAOCULAR LENS PLACEMENT ([place]);  Surgeon: [name], MD;  Location: [facility];  Service: Ophthalmology;  Laterality: Right;  [place] 00:39.7 CDE 4.35 Fluid Pack Lot # [id number]   CHOLECYSTECTOMY  1995   COLONOSCOPY WITH PROPOFOL N/A 06/07/2017   Procedure: COLONOSCOPY WITH PROPOFOL;  Surgeon: [name], MD;  Location[facility] ENDOSCOPY;  Service: Endoscopy;  Laterality: N/A;   ESOPHAGOGASTRODUODENOSCOPY (EGD) WITH PROPOFOL N/A 06/07/2017   Procedure: ESOPHAGOGASTRODUODENOSCOPY (EGD) WITH PROPOFOL;  Surgeon: [name], MD;  Location: [facility];  Service: Endoscopy;  Laterality: N/A;   IR CATHETER TUBE CHANGE  05/10/2020   REDUCTION MAMMAPLASTY Bilateral 1994   Past Surgical History:  Procedure Laterality Date    ANTERIOR CERVICAL DECOMPRESSION/DISCECTOMY FUSION 4 LEVELS N/A 08/28/2019   Procedure: ANTERIOR CERVICAL DECOMPRESSION/DISCECTOMY FUSION CERVICALTHREE-FOUR CERVICAL,FOUR-FIVE,CERVICAL FIVE-SIX,CERVICAL SIX-SEVEN.;  Surgeon: [name], MD;  Location: [facility];  Service: Neurosurgery;  Laterality: N/A;  ANTERIOR   BACK SURGERY     CATARACT EXTRACTION W/PHACO Right 03/31/2018   Procedure: CATARACT EXTRACTION PHACO AND INTRAOCULAR LENS PLACEMENT ([place]);  Surgeon: [name], MD;  Location: [facility];  Service: Ophthalmology;  Laterality: Right;  [place] 00:39.7 CDE 4.35 Fluid Pack Lot # [id number]   CHOLECYSTECTOMY  1995   COLONOSCOPY WITH PROPOFOL N/A 06/07/2017   Procedure: COLONOSCOPY WITH PROPOFOL;  Surgeon: [name], MD;  Location: [facility] ENDOSCOPY;  Service: Endoscopy;  Laterality: N/A;   ESOPHAGOGASTRODUODENOSCOPY (EGD) WITH PROPOFOL N/A 06/07/2017   Procedure: ESOPHAGOGASTRODUODENOSCOPY (EGD) WITH PROPOFOL;  Surgeon: [name], MD;  Location: [facility] ENDOSCOPY;  Service: Endoscopy;  Laterality: N/A;   IR CATHETER TUBE CHANGE  05/10/2020   REDUCTION MAMMAPLASTY Bilateral 1994   Past Medical History:  Diagnosis Date   Arthritis    GERD (gastroesophageal reflux disease)    Gout    Neuromuscular disorder (HCC)    Pre-diabetes    BP 112/73   Pulse 79   Ht '5\' 3"'$  (1.6 m)   SpO2 94%   BMI 35.43 kg/m   Opioid Risk Score:   Fall Risk Score:  `1  Depression screen [facility] 2/9     07/17/2021   10:18 AM 05/02/2021    9:45 AM 04/18/2021    9:58 AM 10/25/2020    2:56 PM 10/04/2020    3:38 PM 05/24/2020    3:12 PM 01/15/2020    2:29 PM  Depression screen PHQ 2/9  Decreased Interest 0 0 0 0 0 0 0  Down, Depressed, Hopeless 0 0 0 0 0 0 0  PHQ - 2 Score 0 0 0 0 0 0 0     Review of Systems  Constitutional: Negative.   HENT: Negative.    Eyes: Negative.   Respiratory: Negative.    Cardiovascular: Negative.   Gastrointestinal: Negative.   Endocrine: Negative.   Genitourinary:  Negative.   Musculoskeletal:  Positive for gait problem.  Skin: Negative.   Allergic/Immunologic: Negative.   Hematological: Negative.   Psychiatric/Behavioral: Negative.        Objective:   Physical Exam  Awake, alert, appropriate, accompanied by daughter, in power w/c; NAD Moving legs around [facility] in place- draining yellow urine  1+ LE edema to lower calf L>R  MS: Curling more of L 2nd digit- less so in 4th/5th digits of L hand.   Neuro: Mild spasticity - MAS of 1+ in L>R LE"s.  No clonus seen in B/L LE's.       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 And worsening spasticity.   Injury was 08/23/19! Here for f/u on SCI/spasticity, etc  Discussed Keppra for nerve pain. As well as lidocaine and Tramadol- for nerve pain control.   2. Decided on tramadol and Lidocaine- waiton keppra. - will get Opiate contract- and oral drug screen- since starting tramadol-  50 mg q6 hours prn OR 100 mg 2x/day(at least 6 hours apart) as needed- for neve pain. Kidney function OK. Cr 0.9 and BUN 19.   3. Also will send in Rx for lidocaine cream/ointment 5% up to 4x/day as needed- don't cover more than front of L leg from knee to foot/and foot/ankle at a time- a nickel sized dollop at a time-   4. Con't Baclofen, and Cymbalta, Paxil and Gabapentin.   5. Also hasn't really taken Buspar for anxiety since on Paxil.    6. Call me Thursday or Friday to let me know - if tramadol working. If so, I'll call in 1 month's supply.    7. Refilled Duloxetine 60 mg daily- 5 refills-    8. Has another appt for Botox early July 2023.    9. F/U 3 months- double appt- SCI/pain.    I spent a total of  31  minutes on total care today- >50% coordination of care- due to opiate contract, discussion of tramadol vs keppra and lidocaine- and  discussion of spasticity overall as well.

## 2021-09-01 NOTE — Progress Notes (Signed)
Suprapubic Cath Change  Patient is present today for a suprapubic catheter change due to urinary retention.  8 ml of water was drained from the balloon, a 16 FR foley cath was removed from the tract with out difficulty.  Site was cleaned and prepped in a sterile fashion with betadine.  A 16 silastic FR foley cath was replaced into the tract no complications were noted. 20 mL urine return was noted, 10 ml of sterile water was inflated into the balloon and a bag was attached for drainage.  Patient tolerated well. A night bag was given to patient and proper instruction was given on how to switch bags.    Performed by: Zara Council, PA-C   Follow up: Return in 1 month for catheter exchange   I,Kailey Littlejohn,acting as a scribe for [facility], PA-C.,have documented all relevant documentation on the behalf of Sydna Brodowski, PA-C,as directed by  [facility], PA-C while in the presence of Diontay Rosencrans, PA-C.

## 2021-09-05 LAB — DRUG TOX MONITOR 1 W/CONF, ORAL FLD

## 2021-09-05 LAB — DRUG TOX ALC METAB W/CON, ORAL FLD: Alcohol Metabolite: NEGATIVE ng/mL (ref <25)

## 2021-09-10 ENCOUNTER — Telehealth: Payer: Self-pay | Admitting: *Deleted

## 2021-09-10 NOTE — Telephone Encounter (Signed)
Oral swab drug screen was  negative for any controlled medication.

## 2021-09-11 ENCOUNTER — Other Ambulatory Visit: Payer: Self-pay | Admitting: Physical Medicine and Rehabilitation

## 2021-09-11 ENCOUNTER — Encounter: Payer: Self-pay | Admitting: Physical Medicine and Rehabilitation

## 2021-09-12 MED ORDER — TRAMADOL HCL 50 MG PO TABS: 50.0000 mg | ORAL_TABLET | Freq: Four times a day (QID) | ORAL | 5 refills | Status: DC | PRN

## 2021-09-29 ENCOUNTER — Ambulatory Visit: Payer: Medicare PPO | Admitting: Urology

## 2021-10-06 ENCOUNTER — Ambulatory Visit: Payer: Medicare PPO | Admitting: Urology

## 2021-10-06 DIAGNOSIS — R339 Retention of urine, unspecified: Secondary | ICD-10-CM

## 2021-10-06 DIAGNOSIS — Z466 Encounter for fitting and adjustment of urinary device: Secondary | ICD-10-CM | POA: Diagnosis not present

## 2021-10-06 DIAGNOSIS — Z9359 Other cystostomy status: Secondary | ICD-10-CM

## 2021-10-06 NOTE — Progress Notes (Signed)
Suprapubic Cath Change  Patient is present today for a suprapubic catheter change due to urinary retention.  8ml of water was drained from the balloon, a 16 FR foley cath was removed from the tract with out difficulty.  Site was cleaned and prepped in a sterile fashion with betadine.  A 16 silastic FR foley cath was replaced into the tract no complications were noted. 10 ml Urine return was noted, 10 ml of sterile water was inflated into the balloon and a  bag was attached for drainage.  Patient tolerated well.   Performed by: Meredith Cowboy, PA-C and Gerarda Gunther, CMA  Follow up: Return in 1 month for SPT exchange   I,Meredith Roach,acting as a scribe for [facility], PA-C.,have documented all relevant documentation on the behalf of Meredith Vinsant, PA-C,as directed by  [facility], PA-C while in the presence of Meredith Tate, PA-C.  I have reviewed the above documentation for accuracy and completeness, and I agree with the above.    Meredith Cowboy, PA-C

## 2021-10-21 ENCOUNTER — Encounter: Payer: Medicare PPO | Attending: Physical Medicine and Rehabilitation | Admitting: Physical Medicine & Rehabilitation

## 2021-10-21 ENCOUNTER — Encounter: Payer: Self-pay | Admitting: Physical Medicine & Rehabilitation

## 2021-10-21 VITALS — BP 102/68 | HR 80 | Ht 63.0 in | Wt 200.0 lb

## 2021-10-21 DIAGNOSIS — G8250 Quadriplegia, unspecified: Secondary | ICD-10-CM

## 2021-10-21 NOTE — Patient Instructions (Signed)
OnabotulinumtoxinA Injection (Medical Use) What is this medication? ONABOTULINUMTOXINA (o na BOTT you lye num tox in eh) treats severe muscle spasms. It may also be used to prevent migraine headaches. It can treat excessive sweating when other medications do not work well enough. This medicine may be used for other purposes; ask your health care provider or pharmacist if you have questions. COMMON BRAND NAME(S): Botox What should I tell my care team before I take this medication? They need to know if you have any of these conditions: Breathing problems Cerebral palsy spasms Difficulty urinating Heart problems History of surgery where this medication is going to be used Infection at the site where this medication is going to be used Myasthenia gravis or other neurologic disease Nerve or muscle disease Surgery plans Take medications that treat or prevent blood clots Thyroid problems An unusual or allergic reaction to botulinum toxin, albumin, other medications, foods, dyes, or preservatives Pregnant or trying to get pregnant Breast-feeding How should I use this medication? This medication is for injected into a muscle. It is given by your care team in a hospital or clinic setting. A special MedGuide will be given to you before each treatment. Be sure to read this information carefully each time. Talk to your care team about the use of this medication in children. While this medication may be prescribed for children as young as 2 years for selected conditions, precautions do apply. Overdosage: If you think you have taken too much of this medicine contact a poison control center or emergency room at once. NOTE: This medicine is only for you. Do not share this medicine with others. What if I miss a dose? This does not apply. What may interact with this medication? Aminoglycoside antibiotics, such as gentamicin, neomycin, tobramycin Muscle relaxants Other botulinum toxin injections This  list may not describe all possible interactions. Give your health care provider a list of all the medicines, herbs, non-prescription drugs, or dietary supplements you use. Also tell them if you smoke, drink alcohol, or use illegal drugs. Some items may interact with your medicine. What should I watch for while using this medication? Visit your care team for regular check ups. This medication will cause weakness in the muscle where it is injected. Tell your care team if you feel unusually weak in other muscles. Get medical help right away if you have problems with breathing, swallowing, or talking. This medication might make your eyelids droop or make you see blurry or double. If you have weak muscles or trouble seeing do not drive a car, use machinery, or do other dangerous activities. This medication contains albumin from human blood. It may be possible to pass an infection in this medication, but no cases have been reported. Talk to your care team about the risks and benefits of this medication. If your activities have been limited by your condition, go back to your regular routine slowly after treatment with this medication. What side effects may I notice from receiving this medication? Side effects that you should report to your care team as soon as possible: Allergic reactions--skin rash, itching, hives, swelling of the face, lips, tongue, or throat Dryness or irritation of the eyes, eye pain, change in vision, sensitivity to light Infection--fever, chills, cough, sore throat, wounds that don't heal, pain or trouble when passing urine, general feeling of discomfort or being unwell Spread of botulinum toxin effects--unusual weakness or fatigue, blurry or double vision, trouble swallowing, hoarseness or trouble speaking, trouble breathing, loss of bladder   control Trouble passing urine Side effects that usually do not require medical attention (report these to your care team if they continue or are  bothersome): Dry mouth Eyelid drooping Fatigue Headache Pain, redness, or irritation at injection site This list may not describe all possible side effects. Call your doctor for medical advice about side effects. You may report side effects to FDA at 1-800-FDA-1088. Where should I keep my medication? This medication is given in a hospital or clinic and will not be stored at home. NOTE: This sheet is a summary. It may not cover all possible information. If you have questions about this medicine, talk to your doctor, pharmacist, or health care provider.  2023 Elsevier/Gold Standard (2021-03-27 00:00:00)  

## 2021-10-21 NOTE — Progress Notes (Signed)
Botox Injection for spasticity using needle EMG guidance   Dilution: 50 Units/ml Indication: Severe spasticity which interferes with ADL,mobility and/or  hygiene and is unresponsive to medication management and other conservative care Informed consent was obtained after describing risks and benefits of the procedure with the patient. This includes bleeding, bruising, infection, excessive weakness, or medication side effects. A REMS form is on file and signed. Needle: 27g 1" needle electrode Number of units per muscle LEFT Brachioradialis 25 FCR0 FCU0 FDS50 FDP50 FPL0 Lumbricals 25 x 3=75 Waste 0U All injections were done after obtaining appropriate EMG activity and after negative drawback for blood. The patient tolerated the procedure well. Post procedure instructions were given.

## 2021-11-03 ENCOUNTER — Ambulatory Visit: Payer: Medicare PPO | Admitting: Urology

## 2021-11-03 DIAGNOSIS — R339 Retention of urine, unspecified: Secondary | ICD-10-CM | POA: Diagnosis not present

## 2021-11-03 DIAGNOSIS — Z9359 Other cystostomy status: Secondary | ICD-10-CM

## 2021-11-03 NOTE — Progress Notes (Signed)
Cath Change/ Replacement  Patient is present today for a catheter change due to urinary retention.  80m of water was removed from the balloon, a 16 FR foley cath was removed with out difficulty.  Patient was cleaned and prepped in a sterile fashion with betadine. A 16 FR foley cath was replaced into the bladder no complications were noted Urine return was noted 542mand urine was clear/yellow in color. The balloon was filled with 1021mf sterile water. A  bag was attached for drainage.  A night bag was also given to the patient and patient was given instruction on how to change from one bag to another. Patient was given proper instruction on catheter care.    Performed by: ShaZara CouncilA-C  Follow up: Return in 1 month for cath exchange  I,Kailey Littlejohn,acting as a scribe for [facility]CA-C.,have documented all relevant documentation on the behalf of Meredith Demedeiros, PA-C,as directed by  SHASt. [facility] ArrowA-C while in the presence of Meredith Foss, PA-C.  I have reviewed the above documentation for accuracy and completeness, and I agree with the above.    ShaZara CouncilA-C

## 2021-11-07 ENCOUNTER — Other Ambulatory Visit: Payer: Self-pay | Admitting: Physical Medicine and Rehabilitation

## 2021-12-01 ENCOUNTER — Ambulatory Visit: Payer: Medicare PPO | Admitting: Urology

## 2021-12-01 DIAGNOSIS — R339 Retention of urine, unspecified: Secondary | ICD-10-CM

## 2021-12-01 DIAGNOSIS — Z9359 Other cystostomy status: Secondary | ICD-10-CM | POA: Diagnosis not present

## 2021-12-01 DIAGNOSIS — N8111 Cystocele, midline: Secondary | ICD-10-CM | POA: Diagnosis not present

## 2021-12-01 NOTE — Progress Notes (Signed)
Cath Change/ Replacement  Patient is present today for a catheter change due to urinary retention.  84m of water was removed from the balloon, a 16FR foley cath was removed with out difficulty.  Patient was cleaned and prepped in a sterile fashion with betadine. A 16 FR foley cath was replaced into the bladder no complications were noted Urine return was noted 265mand urine was clear/yellow in color. The balloon was filled with 1058mf sterile water. A leg bag was attached for drainage.  A night bag was also given to the patient and patient was given instruction on how to change from one bag to another. Patient was given proper instruction on catheter care.    Notes: She feels that her bladder has fallen will refer er back to Dr BesFlo Shanksr pessary replacement   Performed by: SHAZara CouncilA-C and SarTommy RainwaterMA  Follow up: Return in about 1 month (around 01/01/2022) for SPT exchange .

## 2021-12-05 ENCOUNTER — Encounter: Payer: Medicare PPO | Attending: Physical Medicine and Rehabilitation | Admitting: Physical Medicine and Rehabilitation

## 2021-12-05 ENCOUNTER — Encounter: Payer: Self-pay | Admitting: Physical Medicine and Rehabilitation

## 2021-12-05 VITALS — BP 120/83 | HR 70 | Ht 63.0 in

## 2021-12-05 DIAGNOSIS — Z993 Dependence on wheelchair: Secondary | ICD-10-CM | POA: Diagnosis not present

## 2021-12-05 DIAGNOSIS — Z9359 Other cystostomy status: Secondary | ICD-10-CM | POA: Diagnosis present

## 2021-12-05 DIAGNOSIS — R252 Cramp and spasm: Secondary | ICD-10-CM | POA: Diagnosis not present

## 2021-12-05 DIAGNOSIS — S14123S Central cord syndrome at C3 level of cervical spinal cord, sequela: Secondary | ICD-10-CM | POA: Insufficient documentation

## 2021-12-05 DIAGNOSIS — M792 Neuralgia and neuritis, unspecified: Secondary | ICD-10-CM | POA: Diagnosis not present

## 2021-12-05 DIAGNOSIS — N319 Neuromuscular dysfunction of bladder, unspecified: Secondary | ICD-10-CM | POA: Diagnosis present

## 2021-12-05 MED ORDER — BACLOFEN 10 MG PO TABS: ORAL_TABLET | ORAL | 5 refills | Status: DC

## 2021-12-05 NOTE — Patient Instructions (Signed)
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 And worsening spasticity.   Injury was 08/23/19! Here for f/u on SCI/spasticity, etc   SCI support group- [id number]- Last Thursday of each month 6-7pm.   2. Increase baclofen to 15 mg in AM and 15-20 mg at night. Can increase, if you call me; to 15 mg 4x/day, but trying to decrease risk of sleepiness.  Can take a max of 20 mg in AM and 20 mg at night- keep lunch and supper doses at 10 mg for now- but call me if want to increase more.   3. Con't Tramadol prn- doesn't need it often, so will continue it as needed- will refill when pt needs.    4. Con't Duloxetine and Gabapentin- for nerve pain   5. Take off Buspar- not taking- but cont' Paxil for anxiety/mood. Doesn't need refills.   6. Con't supapubic catheter for neurogenic bladder- changed monthly. Hasn't had UTI in > 1 year- which is great  7. F/U in 3 months- double appt- SCI

## 2021-12-05 NOTE — Progress Notes (Signed)
Subjective:    Patient ID: Meredith Roach, female    DOB: Apr 11, 1941, 81 y.o.   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 And worsening spasticity.   Injury was 08/23/19! Here for f/u on SCI/spasticity, etc   Hasn't had to take tramadol a lot lately.  Pain was really bad last night and this AM-  Tramadol was helpful- took 1 so far.   Pain was 10/10- but brought down to 4-5/10.  L foot/toes and went up the back of L leg to L hip-    Trying to walk with RW-  Getting tighter the more she walks.   Shoulders get tight also when walking- and get scrunched up. And becomes painful.   Has been walking ~ around kitchen feet- 25-30 ft total- does 10x usually. 2 wheeled RW. Usually does walking around 11am- usually takes AM dose of baclofen around 9am.  Feels so tight in groin at times-  More tightness not really spasms.  Still on baclofen 10 mg 4x/day.    Doesn't need a refill on Tramadol. Doesn't take unless has to have them.   Got Botox 10/21/21- didn't work as well this time- By Dr Letta Pate-  Robotic glove for L hand- will contract and then straighten hand out.  Doesn't wear anything at night on L hand- has a paddle board that holds L hand flat.     Still has foley- changed monthly- last changed Monday.   Pain Inventory Average Pain 10 Pain Right Now 5 My pain is intermittent, sharp, and burning  LOCATION OF PAIN  toes up to both hips mainly left side  BOWEL Number of stools per week: 10 plus Oral laxative use Yes  Type of laxative OTC Enema or suppository use No  History of colostomy No  Incontinent No   BLADDER Foley    Mobility use a wheelchair needs help with transfers Do you have any goals in this area?  yes  Function retired I need assistance with the following:  dressing, bathing,  toileting, meal prep, household duties, and shopping Do you have any goals in this area?  yes  Neuro/Psych bladder control problems weakness numbness tremor trouble walking  Prior Studies Any changes since last visit?  no  Physicians involved in your care Any changes since last visit?  no   Family History  Problem Relation Age of Onset   Breast cancer Maternal Aunt    Breast cancer Paternal Aunt    CAD Father    Heart attack Father    Social History   Socioeconomic History   Marital status: Married    Spouse name: [name]   Number of children: 2   Years of education: Not on file   Highest education level: Not on file  Occupational History   Not on file  Tobacco Use   Smoking status: Never    Passive exposure: Never   Smokeless tobacco: Never  Vaping Use   Vaping Use: Never used  Substance and Sexual Activity   Alcohol use: No   Drug use: No   Sexual activity: Not Currently  Other Topics Concern   Not on file  Social History Narrative   Lives with daughter   Social Determinants of Health   Financial Resource Strain: Not on file  Food Insecurity: Not  on file  Transportation Needs: Not on file  Physical Activity: Not on file  Stress: Not on file  Social Connections: Not on file   Past Surgical History:  Procedure Laterality Date   ANTERIOR CERVICAL DECOMPRESSION/DISCECTOMY FUSION 4 LEVELS N/A 08/28/2019   Procedure: ANTERIOR CERVICAL DECOMPRESSION/DISCECTOMY FUSION CERVICALTHREE-FOUR CERVICAL,FOUR-FIVE,CERVICAL FIVE-SIX,CERVICAL SIX-SEVEN.;  Surgeon: [name], MD;  Location: [place];  Service: Neurosurgery;  Laterality: N/A;  ANTERIOR   BACK SURGERY     CATARACT EXTRACTION W/PHACO Right 03/31/2018   Procedure: CATARACT EXTRACTION PHACO AND INTRAOCULAR LENS PLACEMENT ([place]);  Surgeon: [name], MD;  Location: [facility];  Service: Ophthalmology;  Laterality: Right;  [place] 00:39.7 CDE 4.35 Fluid Pack Lot # [id number]   CHOLECYSTECTOMY  1995   COLONOSCOPY  WITH PROPOFOL N/A 06/07/2017   Procedure: COLONOSCOPY WITH PROPOFOL;  Surgeon: [name], MD;  Location: [facility] END[facility]COPY;  Service: Endoscopy;  Laterality: N/A;   ESOPHAGOGASTRODUODENOSCOPY (EGD) WITH PROPOFOL N/A 06/07/2017   Procedure: ESOPHAGOGASTRODUODENOSCOPY (EGD) WITH PROPOFOL;  Surgeon: [name], MD;  Location: [facility] ENDOSCOPY;  Service: Endoscopy;  Laterality: N/A;   IR CATHETER TUBE CHANGE  05/10/2020   REDUCTION MAMMAPLASTY Bilateral 1994   Past Medical History:  Diagnosis Date   Arthritis    GERD (gastroesophageal reflux disease)    Gout    Neuromuscular disorder ([place])    Pre-diabetes    There were no vitals taken for this visit.  Opioid Risk Score:   Fall Risk Score:  `1  Depression screen [facility] 2/9     10/21/2021   10:32 AM 07/17/2021   10:18 AM 05/02/2021    9:45 AM 04/18/2021    9:58 AM 10/25/2020    2:56 PM 10/04/2020    3:38 PM 05/24/2020    3:12 PM  Depression screen PHQ 2/9  Decreased Interest 0 0 0 0 0 0 0  Down, Depressed, Hopeless 0 0 0 0 0 0 0  PHQ - 2 Score 0 0 0 0 0 0 0    Review of Systems  Genitourinary:        FOLEY IN PLACE  Musculoskeletal:  Positive for gait problem.       PAIN FROM BOTH FEET UP TO BOTH HIPS  Neurological:  Positive for tremors, weakness and numbness.  All other systems reviewed and are negative.      Objective:   Physical Exam Awake, alert, appropriate, in power w/c; joystick on R; NAD Accompanied by husband  Neuro MAS of 3 in B/L hips- L>R MAS of 2-3 in knees and 2-3 in ankles B/L  A few beats of clonus B/L  Tone in L hand- worse than last seen- clawing- but can with ROM get hand flat-fingers straight- but MAS of 3 almost 4.  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 And worsening  spasticity.   Injury was 08/23/19! Here for f/u on SCI/spasticity, etc   SCI support group- [id number]- Last Thursday of each month 6-7pm.   2. Increase baclofen to 15 mg in AM and 15-20 mg at night. Can increase, if you call me; to 15 mg 4x/day, but trying to decrease risk of sleepiness.  Can take a max of 20 mg in AM and 20 mg  at night- keep lunch and supper doses at 10 mg for now- but call me if want to increase more.   3. Con't Tramadol prn- doesn't need it often, so will continue it as needed- will refill when pt needs.    4. Con't Duloxetine and Gabapentin- for nerve pain   5. Take off Buspar- not taking- but cont' Paxil for anxiety/mood. Doesn't need refills.   6. Con't supapubic catheter for neurogenic bladder- changed monthly. Hasn't had UTI in > 1 year- which is great  7. F/U in 3 months- double appt- SCI   I spent a total of 24   minutes on total care today- >50% coordination of care- due to discussion and assessment of spasticity and discussion about nerve pain and SPC.

## 2021-12-08 ENCOUNTER — Other Ambulatory Visit: Payer: Self-pay | Admitting: Physical Medicine and Rehabilitation

## 2021-12-15 ENCOUNTER — Other Ambulatory Visit: Payer: Self-pay | Admitting: Physical Medicine and Rehabilitation

## 2021-12-22 ENCOUNTER — Encounter: Payer: Self-pay | Admitting: Physical Medicine and Rehabilitation

## 2022-01-02 NOTE — Progress Notes (Unsigned)
Suprapubic Cath Change  Patient is present today for a suprapubic catheter change due to urinary retention.  8 ml of water was drained from the balloon, a 16 FR foley cath was removed from the tract with out difficulty.  Site was cleaned and prepped in a sterile fashion with betadine.  A 16 FR foley cath was replaced into the tract no complications were noted. Urine return was noted, 10 ml of sterile water was inflated into the balloon and a night bag was attached for drainage.  Patient tolerated well.   Performed by: Michiel Cowboy, PA-C and Judd Gaudier, CMA  Follow up: One month for SPT exchange

## 2022-01-05 ENCOUNTER — Ambulatory Visit: Payer: Medicare PPO | Admitting: Urology

## 2022-01-05 DIAGNOSIS — Z9359 Other cystostomy status: Secondary | ICD-10-CM | POA: Diagnosis not present

## 2022-01-05 DIAGNOSIS — R339 Retention of urine, unspecified: Secondary | ICD-10-CM

## 2022-01-29 ENCOUNTER — Encounter: Payer: Self-pay | Admitting: Physical Medicine & Rehabilitation

## 2022-01-29 ENCOUNTER — Encounter: Payer: Medicare PPO | Attending: Physical Medicine and Rehabilitation | Admitting: Physical Medicine & Rehabilitation

## 2022-01-29 VITALS — BP 110/71 | HR 75 | Ht 63.0 in | Wt 200.0 lb

## 2022-01-29 DIAGNOSIS — G8250 Quadriplegia, unspecified: Secondary | ICD-10-CM | POA: Diagnosis present

## 2022-01-29 MED ORDER — ONABOTULINUMTOXINA 100 UNITS IJ SOLR
200.0000 [IU] | Freq: Once | INTRAMUSCULAR | Status: AC
  Administered 2022-01-29: 200 [IU] via INTRAMUSCULAR

## 2022-01-29 MED ORDER — SODIUM CHLORIDE (PF) 0.9 % IJ SOLN
4.0000 mL | Freq: Once | INTRAMUSCULAR | Status: AC
  Administered 2022-01-29: 4 mL

## 2022-01-29 NOTE — Progress Notes (Signed)
Botox Injection for spasticity using needle EMG guidance   Dilution: 50 Units/ml Indication: Severe spasticity which interferes with ADL,mobility and/or  hygiene and is unresponsive to medication management and other conservative care Informed consent was obtained after describing risks and benefits of the procedure with the patient. This includes bleeding, bruising, infection, excessive weakness, or medication side effects. A REMS form is on file and signed. Needle: 27g 1" needle electrode Number of units per muscle Total dose 200 Units LEFT Brachioradialis 25  FDS50 estim to isolate index finger FDP50 estim to isolate index finger   Lumbricals 25 x 3=75 Waste 0U All injections were done after obtaining appropriate EMG activity and after negative drawback for blood. The patient tolerated the procedure well. Post procedure instructions were given.

## 2022-01-29 NOTE — Patient Instructions (Signed)

## 2022-02-06 NOTE — Progress Notes (Signed)
Suprapubic Cath Change  Patient is present today for a suprapubic catheter change due to urinary retention.  8 ml of water was drained from the balloon, a 16 FR foley cath was removed from the tract with out difficulty.  Site was cleaned and prepped in a sterile fashion with betadine.  A 16 FR foley cath was replaced into the tract no complications were noted. Urine return was noted, 10 ml of sterile water was inflated into the balloon and a night bag was attached for drainage.  Patient tolerated well.   Performed by: Michiel Cowboy, PA-C   Follow up: One month for SPT exchange

## 2022-02-07 ENCOUNTER — Other Ambulatory Visit: Payer: Self-pay | Admitting: Physical Medicine and Rehabilitation

## 2022-02-09 ENCOUNTER — Ambulatory Visit: Payer: Medicare PPO | Admitting: Urology

## 2022-02-09 DIAGNOSIS — R339 Retention of urine, unspecified: Secondary | ICD-10-CM | POA: Diagnosis not present

## 2022-02-09 DIAGNOSIS — Z9359 Other cystostomy status: Secondary | ICD-10-CM | POA: Diagnosis not present

## 2022-02-25 ENCOUNTER — Telehealth: Payer: Self-pay | Admitting: Physical Medicine and Rehabilitation

## 2022-02-25 MED ORDER — PAROXETINE HCL 10 MG PO TABS: 10.0000 mg | ORAL_TABLET | Freq: Every day | ORAL | 5 refills | Status: DC

## 2022-02-25 NOTE — Telephone Encounter (Signed)
Patient was scheduled on 12/8 and you wanted them to be seen in Feb.  Patient's husband needs to see you sooner, he said you had discussed another pain medication for wife.  Please call.

## 2022-02-25 NOTE — Telephone Encounter (Signed)
Have called husband- pt is under reporting pain issues- is burning- worse since stopped Paxil and anxiety is also worse.   Will restart Paxil 10 mg nightly x 2 wekes, then can increase to 20 mg nightly.  No higher dose due to Duloxetine.   If doesn't get better with Paxil, call me back and can increase Baclofen to 20-30 mg 4x/day for spasticity- which could be making things worse as well- On max dose of gabapentin.  Husband ot clal me back by 03/13/22 to let me know

## 2022-03-04 NOTE — Progress Notes (Signed)
Suprapubic Cath Change  Patient is present today for a suprapubic catheter change due to urinary retention.  8 ml of water was drained from the balloon, a 16 FR foley cath was removed from the tract with out difficulty.  Site was cleaned and prepped in a sterile fashion with betadine.  A 16 FR foley cath was replaced into the tract no complications were noted. Urine return was noted, 10 ml of sterile water was inflated into the balloon and a night bag was attached for drainage.  Patient tolerated well.   Performed by: Zara Council, PA-C   Follow up: One month SPT exchange

## 2022-03-05 ENCOUNTER — Other Ambulatory Visit: Payer: Self-pay | Admitting: Physical Medicine and Rehabilitation

## 2022-03-09 ENCOUNTER — Ambulatory Visit: Payer: Medicare PPO | Admitting: Urology

## 2022-03-09 ENCOUNTER — Encounter: Payer: Self-pay | Admitting: Urology

## 2022-03-09 VITALS — BP 112/70 | HR 70

## 2022-03-09 DIAGNOSIS — Z9359 Other cystostomy status: Secondary | ICD-10-CM | POA: Diagnosis not present

## 2022-03-09 DIAGNOSIS — R339 Retention of urine, unspecified: Secondary | ICD-10-CM | POA: Diagnosis not present

## 2022-03-20 ENCOUNTER — Ambulatory Visit: Payer: Medicare PPO | Admitting: Physical Medicine and Rehabilitation

## 2022-03-29 NOTE — Progress Notes (Unsigned)
Suprapubic Cath Change  Patient is present today for a suprapubic catheter change due to urinary retention.  8 ml of water was drained from the balloon, a 16 FR foley cath was removed from the tract with out difficulty.  Site was cleaned and prepped in a sterile fashion with betadine.  A 16 FR foley cath was replaced into the tract no complications were noted. Urine return was noted, 10 ml of sterile water was inflated into the balloon and a night bag was attached for drainage.  Patient tolerated well.   Performed by: Michiel Cowboy, PA-C and Luther Hearing, CMA  Follow up: One month for SPT exchange

## 2022-03-30 ENCOUNTER — Ambulatory Visit: Payer: Medicare PPO | Admitting: Urology

## 2022-03-30 DIAGNOSIS — R339 Retention of urine, unspecified: Secondary | ICD-10-CM

## 2022-03-30 DIAGNOSIS — Z9359 Other cystostomy status: Secondary | ICD-10-CM | POA: Diagnosis not present

## 2022-04-02 ENCOUNTER — Other Ambulatory Visit: Payer: Self-pay | Admitting: Physical Medicine and Rehabilitation

## 2022-04-24 NOTE — Progress Notes (Unsigned)
Suprapubic Cath Change  Patient is present today for a suprapubic catheter change due to urinary retention.  8 ml of water was drained from the balloon, a 16 FR foley cath was removed from the tract with out difficulty.  Site was cleaned and prepped in a sterile fashion with betadine.  A 16 FR foley cath was replaced into the tract no complications were noted. Urine return was noted, 10 ml of sterile water was inflated into the balloon and a night bag was attached for drainage.  Patient tolerated well.   Performed by: Michiel Cowboy, PA-C and Luther Hearing, CMA  Follow up: One month for SPT exchange

## 2022-04-27 ENCOUNTER — Ambulatory Visit: Payer: Medicare PPO | Admitting: Urology

## 2022-04-27 ENCOUNTER — Other Ambulatory Visit: Payer: Self-pay | Admitting: Physical Medicine & Rehabilitation

## 2022-04-27 DIAGNOSIS — R339 Retention of urine, unspecified: Secondary | ICD-10-CM | POA: Diagnosis not present

## 2022-04-27 DIAGNOSIS — Z9359 Other cystostomy status: Secondary | ICD-10-CM

## 2022-04-29 ENCOUNTER — Other Ambulatory Visit: Payer: Self-pay | Admitting: Physical Medicine and Rehabilitation

## 2022-05-15 ENCOUNTER — Encounter: Payer: Medicare PPO | Admitting: Physical Medicine and Rehabilitation

## 2022-05-22 NOTE — Progress Notes (Unsigned)
Suprapubic Cath Change  Patient is present today for a suprapubic catheter change due to urinary retention.  9 ml of water was drained from the balloon, a 16 FR foley cath was removed from the tract with out difficulty.  Site was cleaned and prepped in a sterile fashion with betadine.  A 16 FR foley cath was replaced into the tract no complications were noted. Urine return was noted, 10 ml of sterile water was inflated into the balloon and a night bag was attached for drainage.  Patient tolerated well.  Performed by: Michiel Cowboy, PA-C and Luther Hearing, CMA  Follow up: One month for SPT exchange

## 2022-05-25 ENCOUNTER — Ambulatory Visit: Payer: Medicare PPO | Admitting: Urology

## 2022-05-25 DIAGNOSIS — R339 Retention of urine, unspecified: Secondary | ICD-10-CM | POA: Diagnosis not present

## 2022-05-25 DIAGNOSIS — Z9359 Other cystostomy status: Secondary | ICD-10-CM

## 2022-05-27 ENCOUNTER — Ambulatory Visit: Payer: Medicare PPO | Admitting: Physical Medicine and Rehabilitation

## 2022-06-15 ENCOUNTER — Other Ambulatory Visit: Payer: Self-pay

## 2022-06-15 ENCOUNTER — Emergency Department: Payer: Medicare PPO

## 2022-06-15 ENCOUNTER — Emergency Department
Admission: EM | Admit: 2022-06-15 | Discharge: 2022-06-15 | Disposition: A | Discharge status: Home / Self Care | Payer: Medicare PPO | Attending: Emergency Medicine | Admitting: Emergency Medicine

## 2022-06-15 DIAGNOSIS — T428X5A Adverse effect of antiparkinsonism drugs and other central muscle-tone depressants, initial encounter: Secondary | ICD-10-CM | POA: Diagnosis not present

## 2022-06-15 DIAGNOSIS — R4182 Altered mental status, unspecified: Secondary | ICD-10-CM | POA: Diagnosis present

## 2022-06-15 DIAGNOSIS — T481X5A Adverse effect of skeletal muscle relaxants [neuromuscular blocking agents], initial encounter: Secondary | ICD-10-CM

## 2022-06-15 LAB — CBC WITH DIFFERENTIAL/PLATELET
Abs Immature Granulocytes: 0.02 10*3/uL (ref 0.00–0.07)
Basophils Absolute: 0 10*3/uL (ref 0.0–0.1)
Basophils Relative: 0 %
Eosinophils Absolute: 0 10*3/uL (ref 0.0–0.5)
Eosinophils Relative: 0 %
HCT: 43.7 % (ref 36.0–46.0)
Hemoglobin: 14.2 g/dL (ref 12.0–15.0)
Immature Granulocytes: 0 %
Lymphocytes Relative: 22 %
Lymphs Abs: 1.3 10*3/uL (ref 0.7–4.0)
MCH: 27.2 pg (ref 26.0–34.0)
MCHC: 32.5 g/dL (ref 30.0–36.0)
MCV: 83.7 fL (ref 80.0–100.0)
Monocytes Absolute: 0.3 10*3/uL (ref 0.1–1.0)
Monocytes Relative: 5 %
Neutro Abs: 4.1 10*3/uL (ref 1.7–7.7)
Neutrophils Relative %: 73 %
Platelets: 252 10*3/uL (ref 150–400)
RBC: 5.22 MIL/uL — ABNORMAL HIGH (ref 3.87–5.11)
RDW: 13.1 % (ref 11.5–15.5)
WBC: 5.7 10*3/uL (ref 4.0–10.5)
nRBC: 0 % (ref 0.0–0.2)

## 2022-06-15 LAB — COMPREHENSIVE METABOLIC PANEL
ALT: 14 U/L (ref 0–44)
AST: 27 U/L (ref 15–41)
Albumin: 3.8 g/dL (ref 3.5–5.0)
Alkaline Phosphatase: 60 U/L (ref 38–126)
Anion gap: 11 (ref 5–15)
BUN: 13 mg/dL (ref 8–23)
CO2: 26 mmol/L (ref 22–32)
Calcium: 9.2 mg/dL (ref 8.9–10.3)
Chloride: 96 mmol/L — ABNORMAL LOW (ref 98–111)
Creatinine, Ser: 0.74 mg/dL (ref 0.44–1.00)
GFR, Estimated: >60 mL/min (ref >60)
Glucose, Bld: 118 mg/dL — ABNORMAL HIGH (ref 70–99)
Potassium: 4 mmol/L (ref 3.5–5.1)
Sodium: 133 mmol/L — ABNORMAL LOW (ref 135–145)
Total Bilirubin: 0.6 mg/dL (ref 0.3–1.2)
Total Protein: 7.3 g/dL (ref 6.5–8.1)

## 2022-06-15 LAB — BLOOD GAS, VENOUS
Acid-Base Excess: 6.1 mmol/L — ABNORMAL HIGH (ref 0.0–2.0)
Bicarbonate: 31.2 mmol/L — ABNORMAL HIGH (ref 20.0–28.0)
O2 Saturation: 95.4 %
Patient temperature: 37
pCO2, Ven: 46 mmHg (ref 44–60)
pH, Ven: 7.44 — ABNORMAL HIGH (ref 7.25–7.43)
pO2, Ven: 68 mmHg — ABNORMAL HIGH (ref 32–45)

## 2022-06-15 LAB — URINALYSIS, ROUTINE W REFLEX MICROSCOPIC
Bacteria, UA: NONE SEEN
Bilirubin Urine: NEGATIVE
Glucose, UA: NEGATIVE mg/dL
Hgb urine dipstick: NEGATIVE
Ketones, ur: NEGATIVE mg/dL
Nitrite: NEGATIVE
Protein, ur: 30 mg/dL — AB
Specific Gravity, Urine: 1.019 (ref 1.005–1.030)
Squamous Epithelial / HPF: NONE SEEN /HPF (ref 0–5)
WBC, UA: >50 WBC/hpf (ref 0–5)
pH: 7 (ref 5.0–8.0)

## 2022-06-15 MED ORDER — BACLOFEN 10 MG PO TABS: ORAL_TABLET | ORAL | 0 refills | Status: DC

## 2022-06-15 MED ORDER — BACLOFEN 10 MG PO TABS
10.0000 mg | ORAL_TABLET | Freq: Once | ORAL | Status: AC
  Administered 2022-06-15: 10 mg via ORAL
  Filled 2022-06-15: qty 1

## 2022-06-15 NOTE — ED Provider Notes (Signed)
[facility] Provider Note    Event Date/Time   First MD Initiated Contact with Patient 06/15/22 1008     (approximate)   History   Chief Complaint Altered Mental Status   HPI  Meredith Roach is a 82 y.o. female with past medical history of central cord syndrome, spastic quadriplegia, and neurogenic bladder status post suprapubic catheter who presents to the ED for altered mental status.  Per EMS, patient became confused last night and was "talking out of her head."  Symptoms seem to be intermittent at that time, but family had a hard time keeping her awake this morning and so decided to call EMS.  Patient states she does not remember what happened, cannot recall why she is here.  She complains of chronic pain in her left arm and left leg due to prior spinal cord injury.  She denies any issues with her suprapubic catheter.  She denies any fevers, cough, chest pain, shortness of breath, nausea, vomiting, or diarrhea.     Physical Exam   Triage Vital Signs: ED Triage Vitals [06/15/22 1010]  Enc Vitals Group     BP 138/82     Pulse Rate 95     Resp      Temp 98.5 F (36.9 C)     Temp src      SpO2 96 %     Weight      Height      Head Circumference      Peak Flow      Pain Score      Pain Loc      Pain Edu?      Excl. in [place]?     Most recent vital signs: Vitals:   06/15/22 1010  BP: 138/82  Pulse: 95  Temp: 98.5 F (36.9 C)  SpO2: 96%    Constitutional: Alert and oriented to person, place, time, and situation. Eyes: Conjunctivae are normal. Head: Atraumatic. Nose: No congestion/rhinnorhea. Mouth/Throat: Mucous membranes are moist.  Cardiovascular: Normal rate, regular rhythm. Grossly normal heart sounds.  2+ radial pulses bilaterally. Respiratory: Normal respiratory effort.  No retractions. Lungs CTAB. Gastrointestinal: Soft and nontender. No distention. Genitourinary: Suprapubic catheter in place draining clear yellow  urine. Musculoskeletal: No lower extremity tenderness nor edema.  Neurologic:  Normal speech and language.  Spasticity in left upper and lower extremities noted, chronic per patient.  Strength 5 out of 5 in right upper and lower extremities.  No facial droop noted.    ED Results / Procedures / Treatments   Labs (all labs ordered are listed, but only abnormal results are displayed) Labs Reviewed  CBC WITH DIFFERENTIAL/PLATELET - Abnormal; Notable for the following components:      Result Value   RBC 5.22 (*)    All other components within normal limits  COMPREHENSIVE METABOLIC PANEL - Abnormal; Notable for the following components:   Sodium 133 (*)    Chloride 96 (*)    Glucose, Bld 118 (*)    All other components within normal limits  BLOOD GAS, VENOUS - Abnormal; Notable for the following components:   pH, Ven 7.44 (*)    pO2, Ven 68 (*)    Bicarbonate 31.2 (*)    Acid-Base Excess 6.1 (*)    All other components within normal limits  URINALYSIS, ROUTINE W REFLEX MICROSCOPIC - Abnormal; Notable for the following components:   Color, Urine YELLOW (*)    APPearance CLOUDY (*)    Protein, ur 30 (*)  Leukocytes,Ua LARGE (*)    All other components within normal limits  URINE CULTURE     EKG  ED ECG REPORT I, Blake Divine, the attending physician, personally viewed and interpreted this ECG.   Date: 06/15/2022  EKG Time: 10:16  Rate: 98  Rhythm: normal sinus rhythm  Axis: Nomal  Intervals:none  ST&T Change: None  RADIOLOGY CT head reviewed and interpreted by me with no hemorrhage or midline shift.  PROCEDURES:  Critical Care performed: No  Procedures   MEDICATIONS ORDERED IN ED: Medications - No data to display   IMPRESSION / MDM / [place] / ED COURSE  I reviewed the triage vital signs and the nursing notes.                              82 y.o. female with past medical history of central cord syndrome, spastic quadriplegia, and neurogenic  bladder status post suprapubic catheter who presents to the ED for altered mental status with intermittent confusion and somnolence since last night.  Patient's presentation is most consistent with acute presentation with potential threat to life or bodily function.  Differential diagnosis includes, but is not limited to, stroke, other intracranial process, UTI, electrolyte abnormality, AKI, medication effect.  Patient nontoxic-appearing and in no acute distress, vital signs are unremarkable.  Her neurologic exam appears unchanged from previous with spasticity noted primarily on the left side but no new focal deficits noted.  Lower suspicion for stroke and patient is outside the window for acute intervention, will check CT head.  She has a benign abdominal exam, suprapubic catheter appears to be flowing without issue.  We will send urinalysis and check labs, vital signs do not appear concerning for sepsis.  CT head is negative for acute process, chest x-ray is also unremarkable.  VBG is reassuring with no significant hypercapnia, additional labs show no significant anemia, leukocytosis, electrode abnormality, or AKI.  Urinalysis shows possible infection but patient is likely colonized with her suprapubic catheter, no clinical symptoms to raise concern for UTI at this time.  We will send urine for culture but hold off on antibiotics.  Husband is now at bedside and states patient has been without her baclofen for the past 4 days and her presentation may be due to baclofen withdrawal, which is known to cause altered mental status.  She has dealt with intermittent confusion since last night but has been now states she is back to her baseline mental status and I agree that she shows no signs of confusion at this time.  She was able to ambulate with the assistance of a walker, which is her baseline.  She is appropriate for outpatient management with close PCP follow-up, we will refill her baclofen.  Husband  counseled to have her return to the ED for new or worsening symptoms, husband agrees with plan.      FINAL CLINICAL IMPRESSION(S) / ED DIAGNOSES   Final diagnoses:  Altered mental status, unspecified altered mental status type  Adverse effect of baclofen     Rx / DC Orders   ED Discharge Orders          Ordered    baclofen (LIORESAL) 10 MG tablet        06/15/22 1257             Note:  This document was prepared using Dragon voice recognition software and may include unintentional dictation errors.   Kamar Callender,  Juanda Crumble, MD 06/15/22 1259

## 2022-06-15 NOTE — ED Notes (Signed)
Pt ambulated with walker. This RN and ED tech [facility] gave minimal assistance with ambulation. Pt's steady, but slow. Pt husband at bedside and states that pt has an aid that comes in and ambulates pt similar to the way we did at their home. Pt not in any signs of distress while ambulating.

## 2022-06-15 NOTE — ED Notes (Signed)
Pt was recently on antibiotics (amoxicillin) in February for an abscessed tooth.

## 2022-06-15 NOTE — ED Triage Notes (Signed)
Pt comes in today from home via [facility]. According to EMS the family says that's the pt began speaking very altered last night. Family also told EMS that they had a hard time keeping her awake this morning as well. Pt has no complaints of pain at this time. Pt is alert and oriented x4.  Pt has a  foley catheter, and has had it for 3 years. Pt has spells where her words stop making sense, and she mentions having a cap that fell out of her mouth. Nothing visible at this time. Pt's husband is at the bedside.  EMS Vitals 164/86 BGC: 122

## 2022-06-16 ENCOUNTER — Telehealth: Payer: Self-pay | Admitting: *Deleted

## 2022-06-16 MED ORDER — CYPROHEPTADINE HCL 4 MG PO TABS: 4.0000 mg | ORAL_TABLET | Freq: Three times a day (TID) | ORAL | 3 refills | Status: DC | PRN

## 2022-06-16 MED ORDER — BACLOFEN 10 MG PO TABS: ORAL_TABLET | ORAL | 3 refills | Status: DC

## 2022-06-16 NOTE — Telephone Encounter (Signed)
Talked to daughter Kimber Relic into baclofen withdrawal- per daughter.  ED sent her urine for Cx-  No bacteria- waiting on Cx-  Large leuks  >100k enterobacter- growing in Cx right now but sensitivies aren't back yet.   Results from Urine Cx should be back tomorrow-  Periactin/Cyproheptadine 4 mg 3x/day as needed for baclofen withdrawal.   However I think this is more UTI than baclofen withdrawal, but will do.    Sent in 3 months supply of Baclofen AND 3 refills- to make sure she doesn't run out in future And call PCP tomorrow around 11-12 tomorrow to see about treatment based on U Cx Go to ED or urgent care if she runs a fever or has a decline in mental status- like she's getting really sick.  Cyprohetadine can make patients hungry and sleepy- is an old allergy medicine.   Kind of related to benadryl.

## 2022-06-16 NOTE — Telephone Encounter (Signed)
Went ED for baclofen withdrawal. Had been without it for 5 days. Still having a lot of hallucinations and confusion. Asking for Dr Dagoberto Ligas advice if anything else can be done to help.

## 2022-06-17 LAB — URINE CULTURE: Culture: >=100000 — AB

## 2022-06-19 NOTE — Progress Notes (Unsigned)
Suprapubic Cath Change  Patient is present today for a suprapubic catheter change due to urinary retention.  9 ml of water was drained from the balloon, a 16 FR foley cath was removed from the tract with out difficulty.  Site was cleaned and prepped in a sterile fashion with betadine.  A 16 FR foley cath was replaced into the tract no complications were noted. Urine return was noted, 10 ml of sterile water was inflated into the balloon and a night bag was attached for drainage.  Patient tolerated well.   Performed by: Michiel Cowboy, PA-C and Honor Loh, CMA  Follow up: One month for SPT exchange

## 2022-06-22 ENCOUNTER — Ambulatory Visit: Payer: Medicare PPO | Admitting: Urology

## 2022-06-22 VITALS — BP 126/78 | HR 84

## 2022-06-22 DIAGNOSIS — R339 Retention of urine, unspecified: Secondary | ICD-10-CM | POA: Diagnosis not present

## 2022-06-22 DIAGNOSIS — Z9359 Other cystostomy status: Secondary | ICD-10-CM

## 2022-07-03 ENCOUNTER — Encounter: Payer: Medicare PPO | Attending: Physical Medicine and Rehabilitation | Admitting: Physical Medicine and Rehabilitation

## 2022-07-03 ENCOUNTER — Encounter: Payer: Self-pay | Admitting: Physical Medicine and Rehabilitation

## 2022-07-03 VITALS — BP 113/73 | HR 67

## 2022-07-03 DIAGNOSIS — G8250 Quadriplegia, unspecified: Secondary | ICD-10-CM | POA: Diagnosis not present

## 2022-07-03 DIAGNOSIS — R252 Cramp and spasm: Secondary | ICD-10-CM | POA: Diagnosis not present

## 2022-07-03 DIAGNOSIS — Z993 Dependence on wheelchair: Secondary | ICD-10-CM | POA: Insufficient documentation

## 2022-07-03 DIAGNOSIS — N319 Neuromuscular dysfunction of bladder, unspecified: Secondary | ICD-10-CM | POA: Diagnosis not present

## 2022-07-03 MED ORDER — BACLOFEN 20 MG PO TABS: 20.0000 mg | ORAL_TABLET | Freq: Four times a day (QID) | ORAL | 3 refills | Status: DC

## 2022-07-03 NOTE — Progress Notes (Signed)
Subjective:    Patient ID: Meredith Roach, female    DOB: 1940/04/26, 82 y.o.   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 And worsening spasticity.   Injury was 08/23/19! Here for f/u on SCI/spasticity, etc    S/P Botox by Dr Letta Pate 01/29/22 Total dose 200 Units LEFT Brachioradialis 25   FDS50 estim to isolate index finger FDP50 estim to isolate index finger    Lumbricals 25 x 3=75  Got woozy and passed out at the beginning of the month- was told had UTI, but also ran out of Baclofen.  Ran out of it- didn't know until filled up pills.  I don't think was JUST Baclofen withdrawal- pt said wasn't out of it after restarted baclofen- was out there- per husband was out of it- seeing and hearing things for a few days.   First time had a UTI per pt- that she can remember.  Has SPC in abdomen and changed monthly.   Had an abscess in mouth due to tooth pulled a few months ago- feels like something stringing things in mouth- feels like straw- took 2 ABX to get it cleared up.   Has been >3 months for Botox- and was told had ot wait ot see me before sent back to Dr Letta Pate-  Felt was helpful- helps straighten out fingers a lot- but is tightening back up since has been >3 months.   Legs are so tight when moves her back into bed- hurts so bad, cannot touch her per pt to husband.   L leg is the worst she says and worst- between 3/4-9 pm at night. By bedtime, worst time of day.   Is now in flat bed- bed a little higher- has done at night for last 1 week.  Is walking with RW- daily- 40 ft but ~2-3x/day.  Sometimes more.   Claims Tramadol doesn't work- but I explained will not help spasticity pain; will help arthritis/nerve pain.   Anxiety not totally under control- per husband- pt says it is- taking the Paxil- and  Cymbalta.   Pain Inventory Average Pain 8 Pain Right Now 8 My pain is burning and tingling  LOCATION OF PAIN  leg  BOWEL Number of stools per week: 3 Oral laxative use No  Type of laxative . Enema or suppository use No  History of colostomy No  Incontinent No   BLADDER Normal- has SPC- changed monthyl-  In and out cath, frequency . Able to self cath  . Bladder incontinence No  Frequent urination No  Leakage with coughing No  Difficulty starting stream No  Incomplete bladder emptying No    Mobility walk with assistance  Function retired I need assistance with the following:  dressing, bathing, toileting, meal prep, household duties, and shopping  Neuro/Psych bowel control problems tremor tingling trouble walking spasms  Prior Studies Any changes since last visit?  no  Physicians involved in your care Any changes since last visit?  no   Family History  Problem Relation Age of Onset   Breast cancer Maternal Aunt    Breast cancer Paternal Aunt    CAD Father    Heart attack Father    Social History   Socioeconomic History   Marital status: Married    Spouse name: [name]   Number  of children: 2   Years of education: Not on file   Highest education level: Not on file  Occupational History   Not on file  Tobacco Use   Smoking status: Never    Passive exposure: Never   Smokeless tobacco: Never  Vaping Use   Vaping Use: Never used  Substance and Sexual Activity   Alcohol use: No   Drug use: No   Sexual activity: Not Currently  Other Topics Concern   Not on file  Social History Narrative   Lives with daughter   Social Determinants of Health   Financial Resource Strain: Not on file  Food Insecurity: Not on file  Transportation Needs: Not on file  Physical Activity: Not on file  Stress: Not on file  Social Connections: Not on file   Past Surgical History:  Procedure Laterality Date   ANTERIOR CERVICAL DECOMPRESSION/DISCECTOMY FUSION 4  LEVELS N/A 08/28/2019   Procedure: ANTERIOR CERVICAL DECOMPRESSION/DISCECTOMY FUSION CERVICALTHREE-FOUR CERVICAL,FOUR-FIVE,CERVICAL FIVE-SIX,CERVICAL SIX-SEVEN.;  Surgeon: [name], MD;  Location: [facility];  Service: Neurosurgery;  Laterality: N/A;  ANTERIOR   BACK SURGERY     CATARACT EXTRACTION W/PHACO Right 03/31/2018   Procedure: CATARACT EXTRACTION PHACO AND INTRAOCULAR LENS PLACEMENT ([place]);  Surgeon: [name], MD;  Location: [facility];  Service: Ophthalmology;  Laterality: Right;  [place] 00:39.7 CDE 4.35 Fluid Pack Lot # [id number]   CHOLECYSTECTOMY  1995   COLONOSCOPY WITH PROPOFOL N/A 06/07/2017   Procedure: COLONOSCOPY WITH PROPOFOL;  Surgeon: [name], MD;  Location: [facility] ENDOSCOPY;  Service: Endoscopy;  Laterality: N/A;   ESOPHAGOGASTRODUODENOSCOPY (EGD) WITH PROPOFOL N/A 06/07/2017   Procedure: ESOPHAGOGASTRODUODENOSCOPY (EGD) WITH PROPOFOL;  Surgeon: [name], MD;  Location: [facility] ENDOSCOPY;  Service: Endoscopy;  Laterality: N/A;   IR CATHETER TUBE CHANGE  05/10/2020   REDUCTION MAMMAPLASTY Bilateral 1994   Past Medical History:  Diagnosis Date   Arthritis    GERD (gastroesophageal reflux disease)    Gout    Neuromuscular disorder (HCC)    Pre-diabetes    BP 113/73   Pulse 67   Opioid Risk Score:   Fall Risk Score:  `1  Depression screen [facility] 2/9     01/29/2022    3:46 PM 12/05/2021    1:12 PM 10/21/2021   10:32 AM 07/17/2021   10:18 AM 05/02/2021    9:45 AM 04/18/2021    9:58 AM 10/25/2020    2:56 PM  Depression screen PHQ 2/9  Decreased Interest 0 0 0 0 0 0 0  Down, Depressed, Hopeless 0 0 0 0 0 0 0  PHQ - 2 Score 0 0 0 0 0 0 0      Review of Systems  Musculoskeletal:  Positive for gait problem.       Leg pain  Neurological:  Positive for tremors.  All other systems reviewed and are negative.     Objective:   Physical Exam  Awake, alert, appropriate, in power w/c; accompanied by husband, NAD Neuro: Can almost fully extend fingers on L  hand- lacking ~ 5-10 degrees at most- MAS of 2-3 in L hand; 3 in L wrist and 2 in L elbow/2-3 in L shoulder.  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 And worsening spasticity.   Injury was 08/23/19! Here for f/u on SCI/spasticity, etc   First thing need to be checked if acting "off the wall"- that's likely UTI- so need to be checked ASAP.  Call PCP if think has UTI-  Urinalysis  (U/A) and urine culture-  IF really sick, go to Emergency room.  Her urine shouldn't be checked unless sick- because all SCI patients have Colonization- the U/A urinalysis will show bacteria and leukocytes- the way to know its a UTI is to look at the WBC- white blood cells- on U/A -    2.  On Baclofen 10 mg 6x/day- so 60 mg/day- can try to go up to 20 mg 4x/day- will change to 20 mg tablet- use what you have then get refill that I sent in-   Is only on 1 spasticity agent/Baclofen  3.   If spasticity isn't greatly improved, let me know, and I will send in Dantrolene-which will be necessary to try and control spasticity- most SCI patients need 2 meds to get control of spasticity, sometimes 3 meds. But,  wait  2-4 weeks to see how doing and then let me know.   4. Will have pt f/u with Dr Letta Pate for another round of Botox- getting good results when has the Botox done.   5. Con't Suprapubic catheter- change monthly.    6. Tramadol doesn't help Spasticity related pain; only nerve pain and joint/muscle pain-   7. Taking Paxil and Duloxetine for anxiety and nerve pain- con't regimen. Can take Buspar for anxiety- so suggest she takes if needed.    8. F/U in 3 months- double appt- SCI   I  spent a total of 34   minutes on total care today- >50% coordination of care- due to education on colonizations of bladder, UTIs' and d/w pt about anxiety- and d/w pt about getting spasticity better controlled- wai ton Dantrolene but increase Baclofen.

## 2022-07-03 NOTE — Patient Instructions (Signed)
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 And worsening spasticity.   Injury was 08/23/19! Here for f/u on SCI/spasticity, etc   First thing need to be checked if acting "off the wall"- that's likely UTI- so need to be checked ASAP.  Call PCP if think has UTI-  Urinalysis  (U/A) and urine culture-  IF really sick, go to Emergency room.  Her urine shouldn't be checked unless sick- because all SCI patients have Colonization- the U/A urinalysis will show bacteria and leukocytes- the way to know its a UTI is to look at the WBC- white blood cells- on U/A -    2.  On Baclofen 10 mg 6x/day- so 60 mg/day- can try to go up to 20 mg 4x/day- will change to 20 mg tablet- use what you have then get refill that I sent in-   Is only on 1 spasticity agent/Baclofen  3.   If spasticity isn't greatly improved, let me know, and I will send in Dantrolene-which will be necessary to try and control spasticity- most SCI patients need 2 meds to get control of spasticity, sometimes 3 meds. But,  wait  2-4 weeks to see how doing and then let me know.   4. Will have pt f/u with Dr Letta Pate for another round of Botox- getting good results when has the Botox done.   5. Con't Suprapubic catheter- change monthly.    6. Tramadol doesn't help Spasticity related pain; only nerve pain and joint/muscle pain-   7. Taking Paxil and Duloxetine for anxiety and nerve pain- con't regimen. Can take Buspar for anxiety- so suggest she takes if needed.    8. F/U in 3 months- double appt- SCI

## 2022-07-06 ENCOUNTER — Telehealth: Payer: Self-pay | Admitting: *Deleted

## 2022-07-06 MED ORDER — PAROXETINE HCL 20 MG PO TABS: 20.0000 mg | ORAL_TABLET | Freq: Every day | ORAL | 1 refills | Status: DC

## 2022-07-06 NOTE — Telephone Encounter (Signed)
Patient's insurance will not cover rx Paxil 10 mg to be taken twice. The prescription was written 02/2022 take 1 tab daily for 2 weeks then increase to to tabs.  Needs new script for 20mg  tab once daily [place]

## 2022-07-07 ENCOUNTER — Ambulatory Visit: Payer: Medicare PPO | Admitting: Physical Medicine & Rehabilitation

## 2022-07-21 ENCOUNTER — Encounter: Payer: Medicare PPO | Attending: Physical Medicine & Rehabilitation | Admitting: Physical Medicine & Rehabilitation

## 2022-07-21 ENCOUNTER — Encounter: Payer: Self-pay | Admitting: Physical Medicine & Rehabilitation

## 2022-07-21 VITALS — BP 100/66 | HR 70

## 2022-07-21 DIAGNOSIS — R252 Cramp and spasm: Secondary | ICD-10-CM | POA: Diagnosis not present

## 2022-07-21 MED ORDER — SODIUM CHLORIDE (PF) 0.9 % IJ SOLN
4.0000 mL | Freq: Once | INTRAMUSCULAR | Status: AC
  Administered 2022-07-21: 4 mL via INTRAVENOUS

## 2022-07-21 MED ORDER — ONABOTULINUMTOXINA 100 UNITS IJ SOLR
200.0000 [IU] | Freq: Once | INTRAMUSCULAR | Status: AC
  Administered 2022-07-21: 200 [IU] via INTRAMUSCULAR

## 2022-07-21 NOTE — Patient Instructions (Signed)

## 2022-07-21 NOTE — Progress Notes (Signed)
Botox Injection for spasticity using needle EMG guidance   Dilution: 50 Units/ml Indication: Severe spasticity which interferes with ADL,mobility and/or  hygiene and is unresponsive to medication management and other conservative care Informed consent was obtained after describing risks and benefits of the procedure with the patient. This includes bleeding, bruising, infection, excessive weakness, or medication side effects. A REMS form is on file and signed. Needle: 27g 1" needle electrode Number of units per muscle Total dose 200 Units LEFT Brachioradialis 25  FDS50 estim to isolate index finger FDP50 estim to isolate index finger   Lumbricals 25 x 3=75 Waste 0U All injections were done after obtaining appropriate EMG activity and after negative drawback for blood. The patient tolerated the procedure well. Post procedure instructions were given.   

## 2022-07-26 NOTE — Progress Notes (Signed)
Suprapubic Cath Change  Patient is present today for a suprapubic catheter change due to urinary retention.  9 ml of water was drained from the balloon, a 16 FR foley cath was removed from the tract with out difficulty.  Site was cleaned and prepped in a sterile fashion with betadine.  A 16 FR foley cath was replaced into the tract no complications were noted. Urine return was noted, 10 ml of sterile water was inflated into the balloon and a night bag was attached for drainage.  Patient tolerated well.   Performed by: Michiel Cowboy, PA-C and Debbe Bales, CMA   Follow up: One month for SPT exchange

## 2022-07-27 ENCOUNTER — Ambulatory Visit: Payer: Medicare PPO | Admitting: Urology

## 2022-07-27 DIAGNOSIS — R339 Retention of urine, unspecified: Secondary | ICD-10-CM

## 2022-07-27 DIAGNOSIS — Z9359 Other cystostomy status: Secondary | ICD-10-CM | POA: Diagnosis not present

## 2022-08-23 NOTE — Progress Notes (Unsigned)
Suprapubic Cath Change  Patient is present today for a suprapubic catheter change due to urinary retention.  9 ml of water was drained from the balloon, a 16 FR foley cath was removed from the tract with out difficulty.  Site was cleaned and prepped in a sterile fashion with betadine.  A 16 FR foley cath was replaced into the tract no complications were noted. Urine return was noted, 10 ml of sterile water was inflated into the balloon and a night bag was attached for drainage.  Patient tolerated well.   Performed by: Michiel Cowboy, PA-C and Honor Loh, CMA  Follow up: One month for SPT exchange

## 2022-08-24 ENCOUNTER — Ambulatory Visit: Payer: Medicare PPO | Admitting: Urology

## 2022-08-24 DIAGNOSIS — Z9359 Other cystostomy status: Secondary | ICD-10-CM

## 2022-08-24 DIAGNOSIS — R339 Retention of urine, unspecified: Secondary | ICD-10-CM | POA: Diagnosis not present

## 2022-08-31 ENCOUNTER — Ambulatory Visit: Payer: Medicare PPO | Admitting: Urology

## 2022-09-01 ENCOUNTER — Encounter: Payer: Self-pay | Admitting: Physical Medicine & Rehabilitation

## 2022-09-01 ENCOUNTER — Encounter: Payer: Medicare PPO | Attending: Physical Medicine & Rehabilitation | Admitting: Physical Medicine & Rehabilitation

## 2022-09-01 VITALS — BP 107/68 | HR 78 | Ht 63.0 in

## 2022-09-01 DIAGNOSIS — Z5181 Encounter for therapeutic drug level monitoring: Secondary | ICD-10-CM | POA: Diagnosis present

## 2022-09-01 DIAGNOSIS — G894 Chronic pain syndrome: Secondary | ICD-10-CM | POA: Insufficient documentation

## 2022-09-01 DIAGNOSIS — G8250 Quadriplegia, unspecified: Secondary | ICD-10-CM | POA: Diagnosis present

## 2022-09-01 DIAGNOSIS — Z79891 Long term (current) use of opiate analgesic: Secondary | ICD-10-CM | POA: Diagnosis present

## 2022-09-01 NOTE — Patient Instructions (Signed)
Diclofenac gel 3-4 times a day to the hands

## 2022-09-01 NOTE — Progress Notes (Signed)
Subjective:    Patient ID: Meredith Roach, female    DOB: 12/21/1940, 82 y.o.   MRN: [id number]  HPI  82 year old female with spastic quadriparesis who is here in follow-up after botulinum toxin injection to the left upper extremity.  The patient's last injection was on 07/21/2022 she had a previous injection on 01/29/2022, 7 02/2022, 07/17/2021.  Patient has had the same dosing and muscle group selection.  Total dose 200 Units LEFT Brachioradialis 25   FDS50 estim to isolate index finger FDP50 estim to isolate index finger    Lumbricals 25 x 3=75  My pain is constant and burning  LOCATION OF PAIN  left side-leg to shoulder  BOWEL Number of stools per week: 4 Oral laxative use Yes  Enema or suppository use Yes   BLADDER Suprapubic  Mobility use a wheelchair needs help with transfers  Function disabled: date disabled . retired I need assistance with the following:  dressing, bathing, toileting, meal prep, household duties, and shopping  Neuro/Psych tingling trouble walking spasms depression  Prior Studies Any changes since last visit?  no  Physicians involved in your care Any changes since last visit?  no   Family History  Problem Relation Age of Onset   Breast cancer Maternal Aunt    Breast cancer Paternal Aunt    CAD Father    Heart attack Father    Social History   Socioeconomic History   Marital status: Married    Spouse name: Merlyn Albert   Number of children: 2   Years of education: Not on file   Highest education level: Not on file  Occupational History   Not on file  Tobacco Use   Smoking status: Never    Passive exposure: Never   Smokeless tobacco: Never  Vaping Use   Vaping Use: Never used  Substance and Sexual Activity   Alcohol use: No   Drug use: No   Sexual activity: Not Currently  Other Topics Concern   Not on file  Social History Narrative   Lives with daughter   Social Determinants of Health   Financial Resource Strain:  Not on file  Food Insecurity: Not on file  Transportation Needs: Not on file  Physical Activity: Not on file  Stress: Not on file  Social Connections: Not on file   Past Surgical History:  Procedure Laterality Date   ANTERIOR CERVICAL DECOMPRESSION/DISCECTOMY FUSION 4 LEVELS N/A 08/28/2019   Procedure: ANTERIOR CERVICAL DECOMPRESSION/DISCECTOMY FUSION CERVICALTHREE-FOUR CERVICAL,FOUR-FIVE,CERVICAL FIVE-SIX,CERVICAL SIX-SEVEN.;  Surgeon: Tia Alert, MD;  Location: [facility] OR;  Service: Neurosurgery;  Laterality: N/A;  ANTERIOR   BACK SURGERY     CATARACT EXTRACTION W/PHACO Right 03/31/2018   Procedure: CATARACT EXTRACTION PHACO AND INTRAOCULAR LENS PLACEMENT ([facility]);  Surgeon: Elliot Cousin, MD;  Location: [facility] ORS;  Service: Ophthalmology;  Laterality: Right;  [place] 00:39.7 CDE 4.35 Fluid Pack Lot # [id number]   CHOLECYSTECTOMY  1995   COLONOSCOPY WITH PROPOFOL N/A 06/07/2017   Procedure: COLONOSCOPY WITH PROPOFOL;  Surgeon: Scot Jun, MD;  [facility] ENDOSCOPY;  Service: Endoscopy;  Laterality: N/A;   ESOPHAGOGASTRODUODENOSCOPY (EGD) WITH PROPOFOL N/A 06/07/2017   Procedure: ESOPHAGOGASTRODUODENOSCOPY (EGD) WITH PROPOFOL;  Surgeon: Scot Jun, MD;  Location[facility] ENDOSCOPY;  Service: Endoscopy;  Laterality: N/A;   IR CATHETER TUBE CHANGE  05/10/2020   REDUCTION MAMMAPLASTY Bilateral 1994   Past Medical History:  Diagnosis Date   Arthritis    GERD (gastroesophageal reflux disease)    Gout    Neuromuscular disorder (  HCC)    Pre-diabetes    BP 107/68   Pulse 78   Ht 5\' 3"  (1.6 m)   SpO2 93%   BMI 35.43 kg/m   Opioid Risk Score:   Fall Risk Score:  `1  Depression screen PHQ 2/9     09/01/2022    1:55 PM 01/29/2022    3:46 PM 12/05/2021    1:12 PM 10/21/2021   10:32 AM 07/17/2021   10:18 AM 05/02/2021    9:45 AM 04/18/2021    9:58 AM  Depression screen PHQ 2/9  Decreased Interest 1 0 0 0 0 0 0  Down, Depressed, Hopeless 1 0 0 0 0 0 0  PHQ - 2 Score 2 0 0 0 0 0 0     Review of Systems  Constitutional: Negative.   HENT: Negative.    Eyes: Negative.   Respiratory: Negative.    Cardiovascular: Negative.   Gastrointestinal: Negative.   Endocrine: Negative.   Genitourinary: Negative.   Musculoskeletal:  Positive for gait problem.       Arm and leg pain on left side  Skin: Negative.   Allergic/Immunologic: Negative.   Neurological:  Positive for weakness.       Tingling  Hematological: Negative.   Psychiatric/Behavioral:  Positive for dysphoric mood.   All other systems reviewed and are negative.      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  Patient is getting good relief of spasticity in the most problematic left upper extremity.  Right upper extremity has osteoarthritic changes but not as much spastic tone.  Would recommend diclofenac gel for OA changes in both hands 3 times daily or 4 times daily Will continue Botox 200 units left upper extremity every 3 months repeat in 6 weeks

## 2022-09-05 LAB — DRUG TOX MONITOR 1 W/CONF, ORAL FLD

## 2022-09-05 LAB — DRUG TOX ALC METAB W/CON, ORAL FLD: Alcohol Metabolite: NEGATIVE ng/mL (ref <25)

## 2022-09-17 ENCOUNTER — Telehealth: Payer: Self-pay | Admitting: *Deleted

## 2022-09-17 NOTE — Progress Notes (Signed)
Suprapubic Cath Change  Patient is present today for a suprapubic catheter change due to urinary retention.  8 ml of water was drained from the balloon, a 16 FR foley cath was removed from the tract with out difficulty.  Site was cleaned and prepped in a sterile fashion with betadine.  A 16 FR foley cath was replaced into the tract no complications were noted. Urine return was noted, 10 ml of sterile water was inflated into the balloon and a night bag was attached for drainage.  Patient tolerated well.   Performed by: Michiel Cowboy, PA-C and Luther Hearing, CMA  Follow up: Return in about 1 month (around 10/21/2022) for SPT exchange .

## 2022-09-17 NOTE — Telephone Encounter (Signed)
Oral swab drug screen was consistent for prescribed medications.  ?

## 2022-09-19 ENCOUNTER — Other Ambulatory Visit: Payer: Self-pay | Admitting: Physical Medicine and Rehabilitation

## 2022-09-21 ENCOUNTER — Encounter: Payer: Self-pay | Admitting: Urology

## 2022-09-21 ENCOUNTER — Ambulatory Visit: Payer: Medicare PPO | Admitting: Urology

## 2022-09-21 VITALS — BP 103/61 | HR 76

## 2022-09-21 DIAGNOSIS — Z9359 Other cystostomy status: Secondary | ICD-10-CM | POA: Diagnosis not present

## 2022-09-21 DIAGNOSIS — R339 Retention of urine, unspecified: Secondary | ICD-10-CM | POA: Diagnosis not present

## 2022-10-05 ENCOUNTER — Encounter: Payer: Self-pay | Admitting: Physical Medicine and Rehabilitation

## 2022-10-05 ENCOUNTER — Encounter: Payer: Medicare PPO | Attending: Physical Medicine & Rehabilitation | Admitting: Physical Medicine and Rehabilitation

## 2022-10-05 VITALS — BP 102/66 | HR 78

## 2022-10-05 DIAGNOSIS — G8250 Quadriplegia, unspecified: Secondary | ICD-10-CM

## 2022-10-05 DIAGNOSIS — M792 Neuralgia and neuritis, unspecified: Secondary | ICD-10-CM | POA: Diagnosis present

## 2022-10-05 DIAGNOSIS — N319 Neuromuscular dysfunction of bladder, unspecified: Secondary | ICD-10-CM | POA: Diagnosis present

## 2022-10-05 DIAGNOSIS — R252 Cramp and spasm: Secondary | ICD-10-CM

## 2022-10-05 DIAGNOSIS — Z993 Dependence on wheelchair: Secondary | ICD-10-CM | POA: Diagnosis present

## 2022-10-05 NOTE — Progress Notes (Signed)
Subjective:    Patient ID: Meredith Roach, female    DOB: Aug 24, 1940, 82 y.o.   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 And worsening spasticity.   Injury was 08/23/19! Here for f/u on SCI/spasticity, etc   Pt's last Botox 07/21/22- 200 units-  L Brachioradialis 25 units, FDP 50 x2-  Lumbricals x3 25 units- 72 units   Went up on Baclofen- cannot tell what works and what doesn't.     Still stinging and tight-   Been getting some fluid in LE's- up to mid calf-  Had a reaction to doubling up on "fluid pill"- Blacked out and sick/spinning- sounds like BP dropped too far- Used Torsemide-  and Dyazide on 5/9- and BP usually runs low already.   F/u with Dr Meredith Roach This Thursday.    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  Hasn't been using Lidocaine ointment-  Also takes Paxil 20 mg daily for anxiety.    Still has SPC-  Changed 1x/month  Too hot lately-   Husband is diabetic- DM shoes too tight- and rubbed too much- got infection- he's her primary caretaker and they are looking for someone ot help at home-  They have paying for private caregivers ~ 3 hours/day-  But with husband "out of commission, might need leg amputated". Took another bone biopsy- and waiting for results- out of hospital since Saturday.    Using Voltaren gel usually every day-  Hasn't put on hands Likes horse linament better- smells strong, but feels better for short time.  Hurts around L knee and L ankle. Real tight- and doesn't bend as easily per daughter.   Uses  true comfort w/c cushion- hurts her butt at the back-   Pain Inventory Average Pain 5 Pain Right Now 4 My pain is tingling and aching  LOCATION OF PAIN  leg  BOWEL Number of stools per week: 4 Oral laxative use No  Type of laxative .  Enema or suppository use No  History of colostomy No  Incontinent No   BLADDER Foley In and out cath, frequency changed monthly Able to self cath No  Bladder incontinence No  Frequent urination No  Leakage with coughing No  Difficulty starting stream No  Incomplete bladder emptying No    Mobility how many minutes can you walk? 0 ability to climb steps?  no do you drive?  no use a wheelchair  Function retired I need assistance with the following:  feeding, dressing, bathing, toileting, meal prep, household duties, and shopping  Neuro/Psych bladder control problems tingling trouble walking spasms  Prior Studies Any changes since last visit?  no  Physicians involved in your care Any changes since last visit?  no   Family History  Problem Relation Age of Onset   Breast cancer Maternal Aunt    Breast cancer Paternal Aunt    CAD Father    Heart attack Father    Social History   Socioeconomic History   Marital status: Married  Spouse name: Meredith Roach   Number of children: 2   Years of education: Not on file   Highest education level: Not on file  Occupational History   Not on file  Tobacco Use   Smoking status: Never    Passive exposure: Never   Smokeless tobacco: Never  Vaping Use   Vaping Use: Never used  Substance and Sexual Activity   Alcohol use: No   Drug use: No   Sexual activity: Not Currently  Other Topics Concern   Not on file  Social History Narrative   Lives with daughter   Social Determinants of Health   Financial Resource Strain: Not on file  Food Insecurity: Not on file  Transportation Needs: Not on file  Physical Activity: Not on file  Stress: Not on file  Social Connections: Not on file    Past Surgical History:  Procedure Laterality Date   ANTERIOR CERVICAL DECOMPRESSION/DISCECTOMY FUSION 4 LEVELS N/A 08/28/2019   Procedure: ANTERIOR CERVICAL DECOMPRESSION/DISCECTOMY FUSION CERVICALTHREE-FOUR CERVICAL,FOUR-FIVE,CERVICAL FIVE-SIX,CERVICAL SIX-SEVEN.;  Surgeon: Meredith Roach;  Location: [facility] OR;  Service: Neurosurgery;  Laterality: N/A;  ANTERIOR   BACK SURGERY     CATARACT EXTRACTION W/PHACO Right 03/31/2018   Procedure: CATARACT EXTRACTION PHACO AND INTRAOCULAR LENS PLACEMENT (IOC);  Surgeon: Meredith Roach;  Location: [facility] ORS;  Service: Ophthalmology;  Laterality: Right;  [place] 00:39.7 CDE 4.35 Fluid Pack Lot # [id number]   CHOLECYSTECTOMY  1995   COLONOSCOPY WITH PROPOFOL N/A 06/07/2017   Procedure: COLONOSCOPY WITH PROPOFOL;  Surgeon: Meredith Roach;  Location: [facility] ENDOSCOPY;  Service: Endoscopy;  Laterality: N/A;   ESOPHAGOGASTRODUODENOSCOPY (EGD) WITH PROPOFOL N/A 06/07/2017   Procedure: ESOPHAGOGASTRODUODENOSCOPY (EGD) WITH PROPOFOL;  Surgeon: Meredith Roach;  Location: [facility] ENDOSCOPY;  Service: Endoscopy;  Laterality: N/A;   IR CATHETER TUBE CHANGE  05/10/2020   REDUCTION MAMMAPLASTY Bilateral 1994   Past Medical History:  Diagnosis Date   Arthritis    GERD (gastroesophageal reflux disease)    Gout    Neuromuscular disorder (HCC)    Pre-diabetes    BP 102/66   Pulse 78   SpO2 93%   Opioid Risk Score:   Fall Risk Score:  `1  Depression screen PHQ 2/9     09/01/2022    1:55 PM 01/29/2022    3:46 PM 12/05/2021    1:12 PM 10/21/2021   10:32 AM 07/17/2021   10:18 AM 05/02/2021    9:45 AM 04/18/2021    9:58 AM  Depression screen PHQ 2/9  Decreased Interest 1 0 0 0 0 0 0  Down, Depressed, Hopeless 1 0 0 0 0 0 0  PHQ - 2 Score 2 0 0 0 0 0 0     Review of Systems  Musculoskeletal:  Positive for gait problem.       Spasms  All other systems reviewed and are negative.     Objective:   Physical Exam  Wake, alert, appropriate, accompanied  by daughter Meredith Roach; in power w/c; joystick on R side, NAD  L hand- thumb is most tight-  Fingers at rest- slightly clawed, mainly PIPs flexed, but much less than before  MS:  Losing ROM of ankles- R>L- has ~ 80 degrees on R ankle and 85 degrees on L ankle  A lot of palpable fluid to legs- from feet up to distal thighs- esp in hamstrings- most likely reason legs are so tight. Makes ROM more difficult- pitting edema-  Hard ot tease out in legs how much  fluid and how much spasticity a lot of fluid around L knee as well .      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 And worsening spasticity.   Injury was 08/23/19! Here for f/u on SCI/spasticity, etc   Suggest using voltaren at least 2x/day- can use up to 4x/day- is over the counter- for hand arthritis-   2.  Due for Botox 10/22/22.  By Dr Wynn Banker.    3.   Con't Baclofen  20 mg 3x/day- has refills.    4. Suggest Dopplers- to figure out WHY having fluid- to look for possible clots that could explain why so swollen  5. Knee-high compression socks- Amoazon 6 pair 20$- vs compression stockings which are more expensive- and easier to get on.   6. Elevate legs to level of heart- for 30 minutes 2x/day- minimum-   7. Got power w/c- 10/29/19- [employer] - and cushion through medicare- True Comfort is what she's using- I've asked to pressure map her before ordering cushion.  8. Con't Paxil/Duloxetine -call when needs refills.   9. Tramadol- con't- doesn't need Oral drug screen this time.   10. Call me when fluid stuff figured out and then we can decide if need to add Dantrolene for spasticity.   11. Change SPC monthly per Urology.   12. F/U in 3 months- double appt- SCI  13. Nothing research wise for SCI patients. Right now.    I spent a total of 43   minutes on total  care today- >50% coordination of care- due to discussion with pt/daughter about spasticity,  nerve pain; LE fluids in legs and Dantrolene.

## 2022-10-05 NOTE — Patient Instructions (Addendum)
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 And worsening spasticity.   Injury was 08/23/19! Here for f/u on SCI/spasticity, etc   Suggest using voltaren at least 2x/day- can use up to 4x/day- is over the counter- for hand arthritis-   2.  Due for Botox 10/22/22.  By Dr Wynn Banker.    3.   Con't Baclofen  20 mg 3x/day- has refills.    4. Suggest Dopplers- to figure out WHY having fluid- to look for possible clots that could explain why so swollen  5. Knee-high compression socks- Amoazon 6 pair 20$- vs compression stockings which are more expensive- and easier to get on.   6. Elevate legs to level of heart- for 30 minutes 2x/day- minimum-   7. Got power w/c- 10/29/19- [employer] - and cushion through medicare- True Comfort is what she's using- I've asked to pressure map her before ordering cushion.  8. Con't Paxil/Duloxetine -call when needs refills.   9. Tramadol- con't- doesn't need Oral drug screen this time.   10. Call me when fluid stuff figured out and then we can decide if need to add Dantrolene for spasticity.   11. Change SPC monthyl per Urology.   12. F/U in 3 months- double appt- SCI  13. Nothing research wise for SCI patients. Right now.

## 2022-10-09 ENCOUNTER — Other Ambulatory Visit: Payer: Self-pay

## 2022-10-09 DIAGNOSIS — R600 Localized edema: Secondary | ICD-10-CM

## 2022-10-12 ENCOUNTER — Ambulatory Visit
Admission: RE | Admit: 2022-10-12 | Discharge: 2022-10-12 | Disposition: A | Discharge status: Home / Self Care | Payer: Medicare PPO | Source: Ambulatory Visit | Attending: Internal Medicine | Admitting: Internal Medicine

## 2022-10-12 DIAGNOSIS — R600 Localized edema: Secondary | ICD-10-CM | POA: Insufficient documentation

## 2022-10-17 ENCOUNTER — Other Ambulatory Visit: Payer: Self-pay | Admitting: Physical Medicine and Rehabilitation

## 2022-10-22 ENCOUNTER — Encounter: Payer: Self-pay | Admitting: Physical Medicine & Rehabilitation

## 2022-10-22 ENCOUNTER — Encounter: Payer: Medicare PPO | Attending: Physical Medicine & Rehabilitation | Admitting: Physical Medicine & Rehabilitation

## 2022-10-22 VITALS — BP 99/67 | HR 89 | Ht 63.0 in

## 2022-10-22 DIAGNOSIS — G8250 Quadriplegia, unspecified: Secondary | ICD-10-CM | POA: Diagnosis present

## 2022-10-22 MED ORDER — TRAMADOL HCL 50 MG PO TABS: 50.0000 mg | ORAL_TABLET | Freq: Four times a day (QID) | ORAL | 5 refills | Status: DC | PRN

## 2022-10-22 MED ORDER — ONABOTULINUMTOXINA 100 UNITS IJ SOLR
100.0000 [IU] | Freq: Once | INTRAMUSCULAR | Status: AC
  Administered 2022-10-22: 200 [IU] via INTRAMUSCULAR

## 2022-10-22 MED ORDER — SODIUM CHLORIDE (PF) 0.9 % IJ SOLN
2.0000 mL | Freq: Once | INTRAMUSCULAR | Status: AC
  Administered 2022-10-22: 2 mL via INTRAVENOUS

## 2022-10-22 NOTE — Addendum Note (Signed)
Addended by: Janean Sark on: 10/22/2022 04:37 PM   Modules accepted: Orders

## 2022-10-22 NOTE — Patient Instructions (Signed)

## 2022-10-22 NOTE — Progress Notes (Addendum)
Botox Injection for spasticity using needle EMG guidance   Dilution: 50 Units/ml Indication: Severe spasticity which interferes with ADL,mobility and/or  hygiene and is unresponsive to medication management and other conservative care Informed consent was obtained after describing risks and benefits of the procedure with the patient. This includes bleeding, bruising, infection, excessive weakness, or medication side effects. A REMS form is on file and signed. Needle: 27g 1" needle electrode Number of units per muscle Total dose 200 Units LEFT Brachioradialis 25  FDS50 estim to isolate index finger FDP50 estim to isolate index finger   Lumbricals 25 x 3=75 Waste 0U All injections were done after obtaining appropriate EMG activity and after negative drawback for blood. The patient tolerated the procedure well. Post procedure instructions were given.    Pt will f/u with Dr Berline Chough 01/08/23 On last Rx of tramadol will refill , pt declines 90d supply from mail order

## 2022-10-23 ENCOUNTER — Other Ambulatory Visit: Payer: Self-pay | Admitting: Physical Medicine and Rehabilitation

## 2022-10-23 MED ORDER — GABAPENTIN 600 MG PO TABS: 1200.0000 mg | ORAL_TABLET | Freq: Three times a day (TID) | ORAL | 0 refills | Status: DC

## 2022-10-25 NOTE — Progress Notes (Signed)
Suprapubic Cath Change  Patient is present today for a suprapubic catheter change due to urinary retention.  8 ml of water was drained from the balloon, a 16 FR foley cath was removed from the tract with out difficulty.  Site was cleaned and prepped in a sterile fashion with betadine.  A 16 FR foley cath was replaced into the tract no complications were noted. Urine return was noted, 10 ml of sterile water was inflated into the balloon and a night bag was attached for drainage.  Patient tolerated well. A night bag was given to patient and proper instruction was given on how to switch bags.    Performed by: Michiel Cowboy, PA-C and Luther Hearing, CMA  Follow up: Return in about 1 month (around 11/26/2022) for SPT exchange .

## 2022-10-26 ENCOUNTER — Ambulatory Visit: Payer: Medicare PPO | Admitting: Urology

## 2022-10-26 ENCOUNTER — Encounter: Payer: Self-pay | Admitting: Urology

## 2022-10-26 VITALS — BP 110/72 | HR 80

## 2022-10-26 DIAGNOSIS — R339 Retention of urine, unspecified: Secondary | ICD-10-CM

## 2022-10-26 DIAGNOSIS — Z9359 Other cystostomy status: Secondary | ICD-10-CM

## 2022-11-17 ENCOUNTER — Other Ambulatory Visit: Payer: Self-pay | Admitting: Physical Medicine and Rehabilitation

## 2022-11-27 NOTE — Progress Notes (Signed)
Suprapubic Cath Change  Patient is present today for a suprapubic catheter change due to urinary retention.  8 ml of water was drained from the balloon, a 16 FR foley cath was removed from the tract with out difficulty.  Site was cleaned and prepped in a sterile fashion with betadine.  A 16 FR Silastic foley cath was replaced into the tract no complications were noted. Urine return was noted, 10 ml of sterile water was inflated into the balloon and a night bag was attached for drainage.  Patient tolerated well.   Performed by: Michiel Cowboy, PA-C and Luther Hearing, CMA   Follow up: Return in about 4 weeks (around 12/28/2022) for SPT exchange .

## 2022-11-30 ENCOUNTER — Ambulatory Visit: Payer: Medicare PPO | Admitting: Urology

## 2022-11-30 DIAGNOSIS — Z9359 Other cystostomy status: Secondary | ICD-10-CM

## 2022-11-30 DIAGNOSIS — R339 Retention of urine, unspecified: Secondary | ICD-10-CM | POA: Diagnosis not present

## 2022-12-24 ENCOUNTER — Other Ambulatory Visit: Payer: Self-pay | Admitting: Physical Medicine and Rehabilitation

## 2023-01-03 NOTE — Progress Notes (Unsigned)
Suprapubic Cath Change  Patient is present today for a suprapubic catheter change due to urinary retention.  ***ml of water was drained from the balloon, a ***FR foley cath was removed from the tract with out difficulty.  Site was cleaned and prepped in a sterile fashion with betadine.  A ***FR foley cath was replaced into the tract {dnt complications:20057}. Urine return was noted, 10 ml of sterile water was inflated into the balloon and a *** bag was attached for drainage.  Patient tolerated well. A night bag was given to patient and proper instruction was given on how to switch bags.    Performed by: ***  Follow up: No follow-ups on file.

## 2023-01-04 ENCOUNTER — Encounter: Payer: Self-pay | Admitting: Urology

## 2023-01-04 ENCOUNTER — Ambulatory Visit: Payer: Medicare PPO | Admitting: Urology

## 2023-01-04 DIAGNOSIS — R339 Retention of urine, unspecified: Secondary | ICD-10-CM

## 2023-01-04 DIAGNOSIS — Z9359 Other cystostomy status: Secondary | ICD-10-CM

## 2023-01-08 ENCOUNTER — Encounter: Payer: Self-pay | Admitting: Physical Medicine and Rehabilitation

## 2023-01-08 ENCOUNTER — Encounter: Payer: Medicare PPO | Attending: Physical Medicine & Rehabilitation | Admitting: Physical Medicine and Rehabilitation

## 2023-01-08 VITALS — BP 90/55 | HR 85 | Ht 63.0 in

## 2023-01-08 DIAGNOSIS — K592 Neurogenic bowel, not elsewhere classified: Secondary | ICD-10-CM | POA: Diagnosis not present

## 2023-01-08 DIAGNOSIS — Z993 Dependence on wheelchair: Secondary | ICD-10-CM | POA: Diagnosis not present

## 2023-01-08 DIAGNOSIS — G8250 Quadriplegia, unspecified: Secondary | ICD-10-CM | POA: Diagnosis present

## 2023-01-08 DIAGNOSIS — R252 Cramp and spasm: Secondary | ICD-10-CM

## 2023-01-08 DIAGNOSIS — N319 Neuromuscular dysfunction of bladder, unspecified: Secondary | ICD-10-CM

## 2023-01-08 MED ORDER — DANTROLENE SODIUM 50 MG PO CAPS: 50.0000 mg | ORAL_CAPSULE | Freq: Every day | ORAL | 5 refills | Status: DC

## 2023-01-08 NOTE — Patient Instructions (Signed)
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 And worsening spasticity.   Injury was 08/23/19! Here for f/u on SCI/spasticity, etc   Needs to speak with PCP about hoarse voice- also too quiet- husband cannot hear her and hard to understand her.  Worse in AM per pt.  Not painful   2.  Home Health - for home health PT to work on walking/standing again.  Since cannot get her out of the house easily to go to therapy 2-3x/week.   3. Needs to have BM 2x/week- if Dantrolene doesn't work, add a mild laxative daily.   4. Dantrolene- gradual onset of results- reduces tightness-   Side effects- loose stools, sedation, and possible mild weakness  Start 50 mg nightly x 1 week Then 50 mg 2x/day x 1 week Then 50 mg 3x/day x 1 week Then 100 mg 2x/day - until dose changed  Needs CMP/LFTs/labs checked in 1 month, 3 months and every 6 months while on medication  5. Dantrolene can help stools- make her have 1 2-3x/week.    6. Zanaflex causes sedation due to her age- uncomfortable with trying first.   7.  Can get liver functions- so a CMP (called CMP for Dr Hyacinth Meeker) -  in 1 month- otherwise, I will check at my follow up.   8.  Just got tramadol and duloxetine just getting ready and Baclofen and gabapentin don't need refills.   9. F/U- in 3 months double appointment- SCI

## 2023-01-08 NOTE — Progress Notes (Signed)
Subjective:    Patient ID: Meredith Roach, female    DOB: 12-14-1940, 82 y.o.   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 And worsening spasticity.   Injury was 08/23/19! Here for f/u on SCI/spasticity, etc   Injections- Botox-  10/22/22 Total dose 200 Units LEFT Brachioradialis 25   FDS50 estim to isolate index finger FDP50 estim to isolate index finger    Lumbricals 25 x 3=75 Waste 0U   Had Dopplers and were negative for DVT"s.   Nothing going on but LLE stays tight and up towards Tight in L thigh and downwards- makes it impossible to walk.   Not sure if due to spasticity or swelling- some days cannot pick it up.  Some days, can barely use [place]- hard to lift legs to get into [place] now.  Cannot walk anymore.  Used to walk  10-12x around table in dining room- 35-40 steps.  Moved in March and been going on Since February/March.   When doing therapy, walking was much better Last done therapy December 2023.  Not taking Periactin- was for baclofen  withdrawal.   Had UTI in February/March- thinks spasticity/tightness worse since BEFORE UTI early March.    No side effects from baclofen- taking 20 mg 4x/day.    Still has SPC -   Has hoarse voice- worse in AM- sees PCP soon Not painful- husband cannot hear her/understand her anymore frequently.   Has 1 BM/week if lucky  Pain Inventory Average Pain 6 Pain Right Now 6 My pain is burning, stabbing, and tingling  LOCATION OF PAIN  :eg  BOWEL Number of stools per week: 1 Oral laxative use No  Enema or suppository use No  History of colostomy No  Incontinent No   BLADDER Foley In and out cath, frequency changed monthly  Able to self cath No  Bladder incontinence No  Frequent urination No  Leakage with coughing No  Difficulty starting stream  No  Incomplete bladder emptying No    Mobility how many minutes can you walk? 0 ability to climb steps?  no do you drive?  no use a wheelchair  Function retired I need assistance with the following:  feeding, dressing, bathing, toileting, meal prep, household duties, and shopping  Neuro/Psych weakness numbness tremor tingling spasms  Prior Studies Any changes since last visit?  no  Physicians involved in your care Any changes since last visit?  no   Family History  Problem Relation Age of Onset   Breast cancer Maternal Aunt    Breast cancer Paternal Aunt    CAD Father    Heart attack Father    Social History   Socioeconomic History   Marital status: Married    Spouse name: Merlyn Albert   Number of children: 2   Years of education: Not on file   Highest education level: Not on file  Occupational History   Not on file  Tobacco Use   Smoking status: Never    Passive exposure: Never   Smokeless tobacco: Never  Vaping Use   Vaping status: Never Used  Substance and Sexual Activity   Alcohol use: No   Drug use: No   Sexual activity: Not Currently  Other Topics Concern   Not on file  Social History Narrative   Lives with daughter  Social Determinants of Health   Financial Resource Strain: Low Risk  (08/20/2022)   Received from [facility], [facility]   Overall Financial Resource Strain (CARDIA)    Difficulty of Paying Living Expenses: Not hard at all  Food Insecurity: No Food Insecurity (08/20/2022)   Received from [facility], [facility]   Hunger Vital Sign    Worried About Running Out of Food in the Last Year: Never true    Ran Out of Food in the Last Year: Never true  Transportation Needs: No Transportation Needs (08/20/2022)   Received from [facility], [facility]   [facility] - Transportation    In the past 12 months, has lack of transportation kept you  from medical appointments or from getting medications?: No    Lack of Transportation (Non-Medical): No  Physical Activity: Not on file  Stress: Not on file  Social Connections: Not on file   Past Surgical History:  Procedure Laterality Date   ANTERIOR CERVICAL DECOMPRESSION/DISCECTOMY FUSION 4 LEVELS N/A 08/28/2019   Procedure: ANTERIOR CERVICAL DECOMPRESSION/DISCECTOMY FUSION CERVICALTHREE-FOUR CERVICAL,FOUR-FIVE,CERVICAL FIVE-SIX,CERVICAL SIX-SEVEN.;  Surgeon: Tia Alert, MD;  Location: [facility] OR;  Service: Neurosurgery;  Laterality: N/A;  ANTERIOR   BACK SURGERY     CATARACT EXTRACTION W/PHACO Right 03/31/2018   Procedure: CATARACT EXTRACTION PHACO AND INTRAOCULAR LENS PLACEMENT (IOC);  Surgeon: Elliot Cousin, MD;  Location: [facility] ORS;  Service: Ophthalmology;  Laterality: Right;  [place] 00:39.7 CDE 4.35 Fluid Pack Lot # [id number]   CHOLECYSTECTOMY  1995   COLONOSCOPY WITH PROPOFOL N/A 06/07/2017   Procedure: COLONOSCOPY WITH PROPOFOL;  Surgeon: Scot Jun, MD;  Location: [facility] ENDOSCOPY;  Service: Endoscopy;  Laterality: N/A;   ESOPHAGOGASTRODUODENOSCOPY (EGD) WITH PROPOFOL N/A 06/07/2017   Procedure: ESOPHAGOGASTRODUODENOSCOPY (EGD) WITH PROPOFOL;  Surgeon: Scot Jun, MD;  Location: [facility] ENDOSCOPY;  Service: Endoscopy;  Laterality: N/A;   IR CATHETER TUBE CHANGE  05/10/2020   REDUCTION MAMMAPLASTY Bilateral 1994   Past Medical History:  Diagnosis Date   Arthritis    GERD (gastroesophageal reflux disease)    Gout    Neuromuscular disorder (HCC)    Pre-diabetes    Ht 5\' 3"  (1.6 m)   BMI 35.43 kg/m   Opioid Risk Score:   Fall Risk Score:  `1  Depression screen PHQ 2/9     10/22/2022    1:59 PM 09/01/2022    1:55 PM 01/29/2022    3:46 PM 12/05/2021    1:12 PM 10/21/2021   10:32 AM 07/17/2021   10:18 AM 05/02/2021    9:45 AM  Depression screen PHQ 2/9  Decreased Interest 0 1 0 0 0 0 0  Down, Depressed, Hopeless 0 1 0 0 0 0 0  PHQ - 2 Score 0 2 0 0 0 0 0       Review of Systems  Musculoskeletal:  Positive for gait problem.  All other systems reviewed and are negative.     Objective:   Physical Exam  Awake, alert, somewhat hoarse voice- can hear her; NAD In power w/c- accompanied by husband  MS: RUE_ biceps 5-/5; WE 5-/5; Triceps 4+/5; Grip 4-/5 and FA 2/5- cannot really uncurl hand very much LUE- biceps 5-/5; WE 3+/5; triceps 4-/5 and grip 1/5 and FA 0/5 RLE-  HF 2/5; KE 4-/5; DF/PF 4/5 LLE- HF 2+/5; KE- 4+/5 DF 4/5 and PF- 4+/5 Losing range of motion- of ankles- ~ 80-85 degrees  Neuro:  MAS of 2-3 in LLE- hard to tell due to swelling as well and weight of leg MAS of 2 in RLE- in all joints of LE's  Extremities: 1+ on R and 2+ to 3+ on LLE- up to thigh more on L Pitting edema       Assessment & Plan:   Pt is a 82 yr old female with 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 And worsening spasticity.   Injury was 08/23/19! Here for f/u on SCI/spasticity, etc   Needs to speak with PCP about hoarse voice- also too quiet- husband cannot hear her and hard to understand her.  Worse in AM per pt.  Not painful   2.  Home Health - for home health PT to work on walking/standing again.  Since cannot get her out of the house easily to go to therapy 2-3x/week.   3. Needs to have BM 2x/week- if Dantrolene doesn't work, add a mild laxative daily.   4. Dantrolene- gradual onset of results- reduces tightness-   Side effects- loose stools, sedation, and possible mild weakness  Start 50 mg nightly x 1 week Then 50 mg 2x/day x 1 week Then 50 mg 3x/day x 1 week Then 100 mg 2x/day - until dose changed  Needs CMP/LFTs/labs checked in 1 month, 3 months and every 6 months while on medication  5. Dantrolene can help stools- make her have 1 2-3x/week.    6. Zanaflex causes sedation due to her age- uncomfortable with trying  first.   7.  Can get liver functions- so a CMP (called CMP for Dr Hyacinth Meeker) -  in 1 month- otherwise, I will check at my follow up.   8.  Just got tramadol and duloxetine just getting ready and Baclofen and gabapentin don't need refills.   9. F/U- in 3 months double appointment- SCI    I spent a total of  43  minutes on total care today- >50% coordination of care- due to educated on dantrolene- and need for LFTs- decided against [facility], but if PCP cannot, I will do it at [facility]- d/w neurogenic bowel and need for BM's; and d/w them about spasticity and swelling in legs both causing difficulty moving LE's-

## 2023-01-22 ENCOUNTER — Encounter: Payer: Medicare PPO | Attending: Physical Medicine & Rehabilitation | Admitting: Physical Medicine & Rehabilitation

## 2023-01-22 ENCOUNTER — Encounter: Payer: Self-pay | Admitting: Physical Medicine & Rehabilitation

## 2023-01-22 VITALS — BP 117/72 | HR 78 | Temp 98.0°F | Ht 63.0 in

## 2023-01-22 DIAGNOSIS — G8250 Quadriplegia, unspecified: Secondary | ICD-10-CM | POA: Diagnosis present

## 2023-01-22 MED ORDER — ONABOTULINUMTOXINA 100 UNITS IJ SOLR
200.0000 [IU] | Freq: Once | INTRAMUSCULAR | Status: AC
Start: 2023-01-22 — End: 2023-01-22
  Administered 2023-01-22: 200 [IU] via INTRAMUSCULAR

## 2023-01-22 NOTE — Patient Instructions (Signed)

## 2023-01-22 NOTE — Progress Notes (Signed)
Botox Injection for spasticity using needle EMG guidance   Dilution: 50 Units/ml Indication: Severe spasticity which interferes with ADL,mobility and/or  hygiene and is unresponsive to medication management and other conservative care Informed consent was obtained after describing risks and benefits of the procedure with the patient. This includes bleeding, bruising, infection, excessive weakness, or medication side effects. A REMS form is on file and signed. Needle: 27g 1" needle electrode Number of units per muscle Total dose 200 Units LEFT Brachioradialis 25  FDS50 estim to isolate index finger FDP50 estim to isolate index finger   Lumbricals 25 x 3=75 Waste 0U All injections were done after obtaining appropriate EMG activity and after negative drawback for blood. The patient tolerated the procedure well. Post procedure instructions were given.

## 2023-02-04 NOTE — Progress Notes (Signed)
Suprapubic Cath Change  Patient is present today for a suprapubic catheter change due to urinary retention.  8 ml of water was drained from the balloon, a 16 FR Silastic foley cath was removed from the tract with out difficulty.  Site was cleaned and prepped in a sterile fashion with betadine.  A 16 FR Silastic foley cath was replaced into the tract no complications were noted. Urine return was noted, 10 ml of sterile water was inflated into the balloon and a night bag was attached for drainage.  Patient tolerated well.     Performed by: Michiel Cowboy, PA-C   Follow up: Return in about 1 month (around 03/11/2023) for SPT exchange .

## 2023-02-08 ENCOUNTER — Ambulatory Visit: Payer: Medicare PPO | Admitting: Urology

## 2023-02-08 VITALS — BP 112/71 | HR 74

## 2023-02-08 DIAGNOSIS — R339 Retention of urine, unspecified: Secondary | ICD-10-CM | POA: Diagnosis not present

## 2023-02-08 DIAGNOSIS — Z9359 Other cystostomy status: Secondary | ICD-10-CM

## 2023-03-03 ENCOUNTER — Telehealth: Payer: Self-pay

## 2023-03-03 NOTE — Telephone Encounter (Signed)
Meredith Roach will continue to make home visits to work on skilled therapy & walking. Twice a week for three weeks - then once a week for 1 week. [name] is walking with a belt in-place. When the therapist is present. However no one in the home assist with walking, when therapy leaves.

## 2023-03-04 NOTE — Progress Notes (Signed)
Suprapubic Cath Change  Patient is present today for a suprapubic catheter change due to urinary retention.  8 ml of water was drained from the balloon, a 16 FR Silastic foley cath was removed from the tract with out difficulty.  Site was cleaned and prepped in a sterile fashion with betadine.  A 16 FR Silastic foley cath was replaced into the tract no complications were noted. Urine return was noted, 10 ml of sterile water was inflated into the balloon and a night bag was attached for drainage.  Patient tolerated well.   Performed by: Michiel Cowboy, PA-C and Luther Hearing, CMA   Follow up: Return in about 1 month (around 04/07/2023) for SPT exchange .

## 2023-03-08 ENCOUNTER — Ambulatory Visit: Payer: Medicare PPO | Admitting: Urology

## 2023-03-08 DIAGNOSIS — Z9359 Other cystostomy status: Secondary | ICD-10-CM

## 2023-03-08 DIAGNOSIS — R339 Retention of urine, unspecified: Secondary | ICD-10-CM

## 2023-03-09 ENCOUNTER — Telehealth: Payer: Self-pay

## 2023-03-09 DIAGNOSIS — G8250 Quadriplegia, unspecified: Secondary | ICD-10-CM

## 2023-03-09 NOTE — Telephone Encounter (Signed)
Thayer Ohm the Physical Therapist with [facility] has requested a call back [facility] [phone]). To discuss an issue he is having with Mrs. Thigpen home therapy delivery.

## 2023-03-10 ENCOUNTER — Telehealth: Payer: Self-pay

## 2023-03-10 NOTE — Telephone Encounter (Signed)
[name], Production designer, theatre/television/film with [facility]. Call back phone [phone]. Would like to discuss Mrs. Higashi home care visits.

## 2023-03-10 NOTE — Telephone Encounter (Signed)
Meredith Roach with [facility] called about Meredith Roach. No details given.   The call has been returned. [name] was in a meeting. A message has been left for a return call. Also to please leave a detailed request when calling back, on the clinic line.

## 2023-03-10 NOTE — Telephone Encounter (Signed)
I have attempted to call 4 different times- 2 different phones- it won't go through- won't even ring. Can you check on phone number for me, please- thanks, ML

## 2023-03-22 ENCOUNTER — Other Ambulatory Visit: Payer: Self-pay | Admitting: Physical Medicine and Rehabilitation

## 2023-03-24 NOTE — Progress Notes (Signed)
Suprapubic Cath Change  Patient is present today for a suprapubic catheter change due to urinary retention.  8 ml of water was drained from the balloon, a 16 FR Silastic foley cath was removed from the tract with out difficulty.  Site was cleaned and prepped in a sterile fashion with betadine.  A 16 FR Silastic foley cath was replaced into the tract no complications were noted. Urine return was noted, 10 ml of sterile water was inflated into the balloon and a night bag was attached for drainage.  Patient tolerated well.  Performed by: Michiel Cowboy, PA-C  Follow up: Return in about 1 month (around 04/29/2023) for Foley exchange .

## 2023-03-29 ENCOUNTER — Ambulatory Visit: Payer: Medicare PPO | Admitting: Urology

## 2023-03-29 DIAGNOSIS — R339 Retention of urine, unspecified: Secondary | ICD-10-CM

## 2023-03-29 DIAGNOSIS — Z9359 Other cystostomy status: Secondary | ICD-10-CM

## 2023-03-31 ENCOUNTER — Telehealth: Payer: Self-pay | Admitting: *Deleted

## 2023-03-31 NOTE — Telephone Encounter (Signed)
[name] from [facility] called to let us know that they had paused Meredith Roach's visits for hospitalization but she is out and they are resuming their visits.

## 2023-04-05 NOTE — Telephone Encounter (Signed)
Pervis Hocking from [facility] called in to request verbal orders for PT once a week for 2 weeks and 2 times a week for 6 weeks. Can those orders be approved ?

## 2023-04-08 NOTE — Telephone Encounter (Signed)
Verbal orders given to Meredith Roach

## 2023-04-12 ENCOUNTER — Encounter: Payer: Medicare PPO | Attending: Physical Medicine & Rehabilitation | Admitting: Physical Medicine and Rehabilitation

## 2023-04-12 ENCOUNTER — Encounter: Payer: Self-pay | Admitting: Physical Medicine and Rehabilitation

## 2023-04-12 VITALS — BP 99/66 | HR 71 | Ht 63.0 in

## 2023-04-12 DIAGNOSIS — Z993 Dependence on wheelchair: Secondary | ICD-10-CM | POA: Insufficient documentation

## 2023-04-12 DIAGNOSIS — R252 Cramp and spasm: Secondary | ICD-10-CM | POA: Diagnosis present

## 2023-04-12 DIAGNOSIS — K592 Neurogenic bowel, not elsewhere classified: Secondary | ICD-10-CM | POA: Diagnosis not present

## 2023-04-12 DIAGNOSIS — G8250 Quadriplegia, unspecified: Secondary | ICD-10-CM | POA: Diagnosis present

## 2023-04-12 MED ORDER — TRAMADOL HCL 50 MG PO TABS: 50.0000 mg | ORAL_TABLET | Freq: Four times a day (QID) | ORAL | 5 refills | Status: DC | PRN

## 2023-04-12 MED ORDER — DULOXETINE HCL 60 MG PO CPEP: 60.0000 mg | ORAL_CAPSULE | Freq: Every day | ORAL | 5 refills | Status: DC

## 2023-04-12 NOTE — Progress Notes (Signed)
Subjective:    Patient ID: Meredith Roach, female    DOB: October 02, 1940, 82 y.o.   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 And worsening spasticity.   Injury was 08/23/19! Here for f/u on SCI/spasticity, etc   Botox by Dr Wynn Banker 01/22/23 LEFT Brachioradialis 25   FDS50 estim to isolate index finger FDP50 estim to isolate index finger    Lumbricals 25 x 3=75 Waste 0U    Got Botox in October- next appointment 04/27/23.   Things going OK.  Not comfortable for cushion.  [facility] never contacted her Got w/c at hospice store-  Was brand new-   Gel cushion she has is a problem already.   Said w/c was from [employer]-  just never fit right- legs adjusted- per pt, didn't adjust the seat.  Original w/c- folded up and fit in back of car.    Still doing H/H-  PT coming-  2x/week, will go to 1x/week soon-  Walking (has been there in 2 weeks for some reason)- was supposed to come Friday, but then ran out Saturday- so coming tomorrow.   Has a lady coming- paying out of pocket-  someone who stays- Renea Ee- does bath and helps her get dressed- 3 hours- 5 days/week. Unless holiday, etc  Started Dantrolene last visit-  Knocks her out- makes her woozy-   Made her sleep for awhile- takes 1 at breakfast and 1 at lunch and 2 at bedtime.  Doesn't feel like tightness much better.  Maybe some better.   BM's 2x/week right now- sometimes maybe more.   Tramadol- for pain- pain gets worse around 2-3 pm- taking it 2x/day- usually, but thinks would benefit from 3x/day.     Pain Inventory Average Pain 6 Pain Right Now 5 My pain is burning  In the last 24 hours, has pain interfered with the following? General activity 0 Relation with others 0 Enjoyment of life 4 What TIME of day is your pain at its worst? evening Sleep  (in general) Fair  Pain is worse with: sitting Pain improves with: medication Relief from Meds: 7  Family History  Problem Relation Age of Onset   Breast cancer Maternal Aunt    Breast cancer Paternal Aunt    CAD Father    Heart attack Father    Social History   Socioeconomic History   Marital status: Married    Spouse name: Merlyn Albert   Number of children: 2   Years of education: Not on file   Highest education level: Not on file  Occupational History   Not on file  Tobacco Use   Smoking status: Never    Passive exposure: Never   Smokeless tobacco: Never  Vaping Use   Vaping status: Never Used  Substance and Sexual Activity   Alcohol use: No   Drug use: No   Sexual activity: Not Currently  Other Topics Concern   Not on file  Social History Narrative   Lives with daughter   Social Drivers of Health   Financial Resource Strain: Low Risk  (08/20/2022)   Received from [facility], [facility]   Overall Financial Resource Strain (CARDIA)    Difficulty of Paying Living Expenses: Not hard at all  Food Insecurity: No Food Insecurity (08/20/2022)   Received  from [facility], [facility]   Hunger Vital Sign    Worried About Running Out of Food in the Last Year: Never true    Ran Out of Food in the Last Year: Never true  Transportation Needs: No Transportation Needs (08/20/2022)   Received from [facility], [facility]   [facility] - Transportation    In the past 12 months, has lack of transportation kept you from medical appointments or from getting medications?: No    Lack of Transportation (Non-Medical): No  Physical Activity: Not on file  Stress: Not on file  Social Connections: Not on file   Past Surgical History:  Procedure Laterality Date   ANTERIOR CERVICAL DECOMPRESSION/DISCECTOMY FUSION 4 LEVELS N/A 08/28/2019   Procedure: ANTERIOR CERVICAL DECOMPRESSION/DISCECTOMY  FUSION CERVICALTHREE-FOUR CERVICAL,FOUR-FIVE,CERVICAL FIVE-SIX,CERVICAL SIX-SEVEN.;  Surgeon: Tia Alert, MD;  Location: [facility] OR;  Service: Neurosurgery;  Laterality: N/A;  ANTERIOR   BACK SURGERY     CATARACT EXTRACTION W/PHACO Right 03/31/2018   Procedure: CATARACT EXTRACTION PHACO AND INTRAOCULAR LENS PLACEMENT (IOC);  Surgeon: Elliot Cousin, MD;  Location: [facility] ORS;  Service: Ophthalmology;  Laterality: Right;  [place] 00:39.7 CDE 4.35 Fluid Pack Lot # [id number]   CHOLECYSTECTOMY  1995   COLONOSCOPY WITH PROPOFOL N/A 06/07/2017   Procedure: COLONOSCOPY WITH PROPOFOL;  Surgeon: Scot Jun, MD;  Location: [facility] ENDOSCOPY;  Service: Endoscopy;  Laterality: N/A;   ESOPHAGOGASTRODUODENOSCOPY (EGD) WITH PROPOFOL N/A 06/07/2017   Procedure: ESOPHAGOGASTRODUODENOSCOPY (EGD) WITH PROPOFOL;  Surgeon: Scot Jun, MD;  Location: [facility] ENDOSCOPY;  Service: Endoscopy;  Laterality: N/A;   IR CATHETER TUBE CHANGE  05/10/2020   REDUCTION MAMMAPLASTY Bilateral 1994   Past Surgical History:  Procedure Laterality Date   ANTERIOR CERVICAL DECOMPRESSION/DISCECTOMY FUSION 4 LEVELS N/A 08/28/2019   Procedure: ANTERIOR CERVICAL DECOMPRESSION/DISCECTOMY FUSION CERVICALTHREE-FOUR CERVICAL,FOUR-FIVE,CERVICAL FIVE-SIX,CERVICAL SIX-SEVEN.;  Surgeon: Tia Alert, MD;  Location: [facility] OR;  Service: Neurosurgery;  Laterality: N/A;  ANTERIOR   BACK SURGERY     CATARACT EXTRACTION W/PHACO Right 03/31/2018   Procedure: CATARACT EXTRACTION PHACO AND INTRAOCULAR LENS PLACEMENT (IOC);  Surgeon: Elliot Cousin, MD;  Location: [facility] ORS;  Service: Ophthalmology;  Laterality: Right;  [place] 00:39.7 CDE 4.35 Fluid Pack Lot # [id number]   CHOLECYSTECTOMY  1995   COLONOSCOPY WITH PROPOFOL N/A 06/07/2017   Procedure: COLONOSCOPY WITH PROPOFOL;  Surgeon: Scot Jun, MD;  Location: [facility] ENDOSCOPY;  Service: Endoscopy;  Laterality: N/A;   ESOPHAGOGASTRODUODENOSCOPY (EGD) WITH PROPOFOL N/A 06/07/2017   Procedure:  ESOPHAGOGASTRODUODENOSCOPY (EGD) WITH PROPOFOL;  Surgeon: Scot Jun, MD;  Location: [facility] ENDOSCOPY;  Service: Endoscopy;  Laterality: N/A;   IR CATHETER TUBE CHANGE  05/10/2020   REDUCTION MAMMAPLASTY Bilateral 1994   Past Medical History:  Diagnosis Date   Arthritis    GERD (gastroesophageal reflux disease)    Gout    Neuromuscular disorder (HCC)    Pre-diabetes    BP 99/66   Pulse 71 Comment: unable to get fingers are cold  Ht 5\' 3"  (1.6 m)   BMI 35.43 kg/m   Opioid Risk Score:   Fall Risk Score:  `1  Depression screen PHQ 2/9     01/22/2023    2:02 PM 01/08/2023    2:17 PM 10/22/2022    1:59 PM 09/01/2022    1:55 PM 01/29/2022    3:46 PM 12/05/2021    1:12 PM 10/21/2021   10:32 AM  Depression screen PHQ 2/9  Decreased Interest 0 0 0 1  0 0 0  Down, Depressed, Hopeless 0 0 0 1 0 0 0  PHQ - 2 Score 0 0 0 2 0 0 0      Review of Systems  Musculoskeletal:  Positive for back pain and gait problem.  All other systems reviewed and are negative.     Objective:   Physical Exam  Awake, alert, appropriate, slightly anxious; accompanied by daughter, in power w/c; NAD  Neuro:  MAS of 2 in LLE- not 2-3- almost 3 last visit MAS - 1+ to 2 in RLE_  LUE- looks better as well- MAS of at max 2 in LUE- losing ROM of L MCPs- cannot get into neutral since always using tenodesis.  RUE MAS of 1+ throughout except locations of contracture of R MCPs-  Losing ROM of ankles B/L- ~ 75 degrees ankle DF  at max.      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 And worsening spasticity.   Injury was 08/23/19! Here for f/u on SCI/spasticity, etc   Called Harrie Jeans- pt doesn't have w/c through a company- so suggested- [email or web address] for [employer].  Email him with exact serial number on w/c- and he  can figure out the right size.  Suggest Quadtro ROHO-  on [employer].  Had a w/c-  through [facility] that never fit right.  2. Suggest increasing tramadol to 3x/day- is written for 4x/day as needed, so can increase without med change.   3. Paxil last refilled 12/9- doesn't need refills.    4. Last refilled Gabapentin 1200 mg TID- for nerve pain- last filled 11/17/22- call if needs more refills.    5. Will refill Duloxetine 60 mg daily- will send in with 5 refills.    6. Con't Dantrolene- since spasticity looking better-    7. Didn't wear PRAFOs- (black boots) since weren't comfortable- but has lost ROM of ankles B/L-  Cannot use other boots, because has lost too much ROM.    8. CMP- a lab to make sure your liver and kidney function are doing well on Dantrolene- after this check.    9. F/U in 3months- double appt- SCI   I spent a total of 43   minutes on total care today- >50% coordination of care- due to d/w pt about W?C and cushion- and d/w w/c rep- and refill of meds- CMP ordered and educated why about liver issues- and education that spasticity better-

## 2023-04-12 NOTE — Patient Instructions (Signed)
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 And worsening spasticity.   Injury was 08/23/19! Here for f/u on SCI/spasticity, etc   Called [employer]ans- pt doesn't have w/c through a company- so suggested- [email or web address] for [employer].  Email him with exact serial number on w/c- and he can figure out the right size.  Suggest Quadtro ROHO-  on [employer].  Had a w/c-  through [facility] that never fit right.  2. Suggest increasing tramadol to 3x/day- is written for 4x/day as needed, so can increase without med change.   3. Paxil last refilled 12/9- doesn't need refills.    4. Last refilled Gabapentin 1200 mg TID- for nerve pain- last filled 11/17/22- call if needs more refills.    5. Will refill Duloxetine 60 mg daily- will send in with 5 refills.    6. Con't Dantrolene- since spasticity looking better-    7. Didn't wear PRAFOs- (black boots) since weren't comfortable- but has lost ROM of ankles B/L-  Cannot use other boots, because has lost too much ROM.    8. CMP- a lab to make sure your liver and kidney function are doing well on Dantrolene- after this check.    9. F/U in 3months- double appt- SCI

## 2023-04-13 ENCOUNTER — Encounter: Payer: Self-pay | Admitting: Physical Medicine and Rehabilitation

## 2023-04-13 LAB — COMPREHENSIVE METABOLIC PANEL
ALT: 7 [IU]/L (ref 0–32)
AST: 18 [IU]/L (ref 0–40)
Albumin: 4.2 g/dL (ref 3.8–4.8)
Alkaline Phosphatase: 81 [IU]/L (ref 44–121)
BUN/Creatinine Ratio: 16 (ref 12–28)
BUN: 12 mg/dL (ref 8–27)
Bilirubin Total: 0.3 mg/dL (ref 0.0–1.2)
CO2: 29 mmol/L (ref 20–29)
Calcium: 9.7 mg/dL (ref 8.7–10.3)
Chloride: 90 mmol/L — ABNORMAL LOW (ref 96–106)
Creatinine, Ser: 0.77 mg/dL (ref 0.57–1.00)
Globulin, Total: 2.3 g/dL (ref 1.5–4.5)
Glucose: 90 mg/dL (ref 70–99)
Potassium: 3.8 mmol/L (ref 3.5–5.2)
Sodium: 134 mmol/L (ref 134–144)
Total Protein: 6.5 g/dL (ref 6.0–8.5)
eGFR: 78 mL/min/{1.73_m2} (ref >59)

## 2023-04-23 NOTE — Progress Notes (Signed)
 Suprapubic Cath Change  Patient is present today for a suprapubic catheter change due to urinary retention.  8 ml of water was drained from the balloon, a 16 FR Silastic foley cath was removed from the tract with out difficulty.  Site was cleaned and prepped in a sterile fashion with betadine .  A 16 FR  foley cath was replaced into the tract no complications were noted. Urine return was noted, 10 ml of sterile water was inflated into the balloon and a night bag was attached for drainage.  Patient tolerated well.   Performed by: CLOTILDA CORNWALL, PA-C and Okie Glanton, CMA   Follow up: Return in about 4 weeks (around 05/24/2023) for SPT exchange .

## 2023-04-26 ENCOUNTER — Ambulatory Visit: Payer: Medicare PPO | Admitting: Urology

## 2023-04-26 DIAGNOSIS — R339 Retention of urine, unspecified: Secondary | ICD-10-CM

## 2023-04-26 DIAGNOSIS — Z9359 Other cystostomy status: Secondary | ICD-10-CM

## 2023-04-27 ENCOUNTER — Encounter: Payer: Self-pay | Admitting: Physical Medicine & Rehabilitation

## 2023-04-27 ENCOUNTER — Encounter: Payer: Medicare PPO | Attending: Physical Medicine & Rehabilitation | Admitting: Physical Medicine & Rehabilitation

## 2023-04-27 VITALS — BP 114/73 | HR 85 | Ht 63.0 in

## 2023-04-27 DIAGNOSIS — G8250 Quadriplegia, unspecified: Secondary | ICD-10-CM | POA: Diagnosis not present

## 2023-04-27 MED ORDER — ONABOTULINUMTOXINA 100 UNITS IJ SOLR
200.0000 [IU] | Freq: Once | INTRAMUSCULAR | Status: AC
  Administered 2023-04-27: 200 [IU] via INTRAMUSCULAR

## 2023-04-27 NOTE — Progress Notes (Signed)
Botox Injection for spasticity using needle EMG guidance   Dilution: 50 Units/ml Indication: Severe spasticity which interferes with ADL,mobility and/or  hygiene and is unresponsive to medication management and other conservative care Informed consent was obtained after describing risks and benefits of the procedure with the patient. This includes bleeding, bruising, infection, excessive weakness, or medication side effects. A REMS form is on file and signed. Needle: 27g 1" needle electrode Number of units per muscle Total dose 200 Units LEFT Brachioradialis 25  FDS50 estim to isolate index finger FDP50 estim to isolate index finger   Lumbricals 25 x 3=75 Waste 0U All injections were done after obtaining appropriate EMG activity and after negative drawback for blood. The patient tolerated the procedure well. Post procedure instructions were given.

## 2023-05-13 ENCOUNTER — Other Ambulatory Visit: Payer: Self-pay | Admitting: Physical Medicine and Rehabilitation

## 2023-05-20 NOTE — Progress Notes (Signed)
 Suprapubic Cath Change  Patient is present today for a suprapubic catheter change due to urinary retention.  8 ml of water was drained from the balloon, a 16 FR foley cath was removed from the tract with out difficulty.  Site was cleaned and prepped in a sterile fashion with betadine .  A 16 FR foley cath was replaced into the tract no complications were noted. Urine return was noted, 10 ml of sterile water was inflated into the balloon and a night bag was attached for drainage.  Patient tolerated well.   Performed by: Matilde Son, PA-C and Okie Glanton, CMA   Follow up: Return in about 4 weeks (around 06/21/2023) for SPT exchange .

## 2023-05-24 ENCOUNTER — Ambulatory Visit: Payer: Medicare PPO | Admitting: Urology

## 2023-05-24 DIAGNOSIS — Z9359 Other cystostomy status: Secondary | ICD-10-CM

## 2023-05-24 DIAGNOSIS — R339 Retention of urine, unspecified: Secondary | ICD-10-CM

## 2023-05-31 ENCOUNTER — Telehealth: Payer: Self-pay

## 2023-05-31 NOTE — Telephone Encounter (Signed)
 VM left for Meredith Roach regarding Dr Berline Chough reply

## 2023-05-31 NOTE — Telephone Encounter (Signed)
 Pervis Hocking from [facility] called in requesting extended therapy 2x a week for 2 weeks, she also is recommending that patient gets an outpatient referral, she Is having slow progress with gait assistance and family is not comfortable walking her when needed. She also wanted to confirm the gabapentin saying that she never seen anyone on this dose and wanted to make sure it is correct for 600mg  2 capsules 3x daily.

## 2023-06-17 NOTE — Progress Notes (Signed)
 Suprapubic Cath Change  Patient is present today for a suprapubic catheter change due to urinary retention.  8 ml of water was drained from the balloon, a 16 FR foley cath was removed from the tract with out difficulty.  Site was cleaned and prepped in a sterile fashion with betadine.  A 16 Silastic FR foley cath was replaced into the tract no complications were noted. Urine return was noted, 10 ml of sterile water was inflated into the balloon and a night bag was attached for drainage.  Patient tolerated well. A night bag was given to patient and proper instruction was given on how to switch bags.    Performed by: Michiel Cowboy, PA-C and Luther Hearing, CMA   Follow up: Return in about 1 month (around 07/22/2023) for SPT exchange .

## 2023-06-21 ENCOUNTER — Ambulatory Visit: Payer: Medicare PPO | Admitting: Urology

## 2023-06-21 DIAGNOSIS — R339 Retention of urine, unspecified: Secondary | ICD-10-CM

## 2023-06-21 DIAGNOSIS — Z9359 Other cystostomy status: Secondary | ICD-10-CM

## 2023-07-05 ENCOUNTER — Other Ambulatory Visit: Payer: Self-pay | Admitting: Physical Medicine and Rehabilitation

## 2023-07-16 ENCOUNTER — Encounter: Payer: Self-pay | Admitting: Physical Medicine and Rehabilitation

## 2023-07-16 ENCOUNTER — Other Ambulatory Visit: Payer: Self-pay

## 2023-07-16 ENCOUNTER — Encounter: Payer: Medicare PPO | Attending: Physical Medicine & Rehabilitation | Admitting: Physical Medicine and Rehabilitation

## 2023-07-16 VITALS — BP 100/85 | HR 78

## 2023-07-16 DIAGNOSIS — G894 Chronic pain syndrome: Secondary | ICD-10-CM | POA: Insufficient documentation

## 2023-07-16 DIAGNOSIS — G8250 Quadriplegia, unspecified: Secondary | ICD-10-CM | POA: Diagnosis present

## 2023-07-16 DIAGNOSIS — Z79891 Long term (current) use of opiate analgesic: Secondary | ICD-10-CM | POA: Insufficient documentation

## 2023-07-16 DIAGNOSIS — G8929 Other chronic pain: Secondary | ICD-10-CM | POA: Insufficient documentation

## 2023-07-16 DIAGNOSIS — Z5181 Encounter for therapeutic drug level monitoring: Secondary | ICD-10-CM | POA: Insufficient documentation

## 2023-07-16 MED ORDER — PAROXETINE HCL 20 MG PO TABS
20.0000 mg | ORAL_TABLET | Freq: Every day | ORAL | 5 refills | Status: DC
  Filled 2023-07-16: qty 90, 90d supply, fill #0

## 2023-07-16 MED ORDER — DANTROLENE SODIUM 100 MG PO CAPS
100.0000 mg | ORAL_CAPSULE | Freq: Three times a day (TID) | ORAL | 5 refills | Status: DC
  Filled 2023-07-16: qty 90, 30d supply, fill #0
  Filled 2023-08-13: qty 90, 30d supply, fill #1

## 2023-07-16 MED ORDER — TRAMADOL HCL 50 MG PO TABS
50.0000 mg | ORAL_TABLET | Freq: Four times a day (QID) | ORAL | 5 refills | Status: DC | PRN
  Filled 2023-07-16: qty 120, 30d supply, fill #0

## 2023-07-16 MED ORDER — GABAPENTIN 600 MG PO TABS
1200.0000 mg | ORAL_TABLET | Freq: Three times a day (TID) | ORAL | 5 refills | Status: DC
  Filled 2023-07-16: qty 180, 30d supply, fill #0
  Filled 2023-08-13: qty 180, 30d supply, fill #1

## 2023-07-16 MED ORDER — DULOXETINE HCL 60 MG PO CPEP
60.0000 mg | ORAL_CAPSULE | Freq: Every day | ORAL | 5 refills | Status: DC
  Filled 2023-07-16: qty 30, 30d supply, fill #0

## 2023-07-16 NOTE — Addendum Note (Signed)
 Addended by: Silas Sacramento T on: 07/16/2023 03:24 PM   Modules accepted: Orders

## 2023-07-16 NOTE — Progress Notes (Signed)
 Subjective:    Patient ID: Meredith Roach, female    DOB: 12/28/1940, 83 y.o.   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 And worsening spasticity.   Injury was 08/23/19! Here for f/u on SCI/spasticity, etc   Last Botox by Dr Wynn Banker 04/27/23 LEFT Brachioradialis 25   FDS50 estim to isolate index finger FDP50 estim to isolate index finger    Lumbricals 25 x 3=75- 200 units    Don't go outside right now- because pollen is bad!  Not much going on!  Has a place on backside-   Has spot on top of buttocks - between the buttocks/crack- on L side- started this week.   Does pressure relief frequently- and gets in recliner daily for 2-3 hours/day- doesn't count how often- and shift frequently per daughter- it's hard to say how often.   Lady to come with her- Stands and then washed by this woman- and then sits back down and gets clothes on- does HEP with legs. 9-12 am   Bought a cushion  a w/c pressure relieving cushion that has the tailbone "cut out"- using it in afternoons since Tuesday- and then had switched to  this new cushion- [name]- daughter - thinks it's a pressure ulcer but not positive- but found it this week.    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    Had a ROHO- wasn't comfortable- felt like bending forward- and felt like putting more pressure on backside.    Was taking Tramadol- 2x/day- and prn in afternoon- and now usually 3x/day- and does AM and PM all the time.    Having trouble having BM's- not getting a bowel program- usually goes at night- will go days before will go-  [facility] Sunday or Monday. Wants to change Stool softener- used to go every  other day, before injury. Was going ~ 3 days but now q4-5 days.   Still has SPC- catheter- Last changed 2 weeks ago- is due to change 4/21.   Pain Inventory Average Pain 5 Pain Right Now 5 My pain is burning  In the last 24 hours, has pain interfered with the following? General activity 2 Relation with others 2 Enjoyment of life 3 What TIME of day is your pain at its worst? evening Sleep (in general) Fair  Pain is worse with: sitting Pain improves with:  standing Relief from Meds: 5  Family History  Problem Relation Age of Onset   Breast cancer Maternal Aunt    Breast cancer Paternal Aunt    CAD Father    Heart attack Father    Social History   Socioeconomic History   Marital status: Married    Spouse name: Merlyn Albert   Number of children: 2   Years of education: Not on file   Highest education level: Not on file  Occupational History   Not on file  Tobacco Use   Smoking status: Never    Passive exposure: Never   Smokeless tobacco: Never  Vaping Use   Vaping status: Never Used  Substance and Sexual Activity   Alcohol use: No   Drug use:  No   Sexual activity: Not Currently  Other Topics Concern   Not on file  Social History Narrative   Lives with daughter   Social Drivers of Health   Financial Resource Strain: Low Risk  (08/20/2022)   Received from [facility], [facility]   Overall Financial Resource Strain (CARDIA)    Difficulty of Paying Living Expenses: Not hard at all  Food Insecurity: No Food Insecurity (08/20/2022)   Received from [facility], [facility]   Hunger Vital Sign    Worried About Running Out of Food in the Last Year: Never true    Ran Out of Food in the Last Year: Never true  Transportation Needs: No Transportation Needs (08/20/2022)   Received from [facility], [facility]   [facility] - Transportation    In the past 12 months, has lack of  transportation kept you from medical appointments or from getting medications?: No    Lack of Transportation (Non-Medical): No  Physical Activity: Not on file  Stress: Not on file  Social Connections: Not on file   Past Surgical History:  Procedure Laterality Date   ANTERIOR CERVICAL DECOMPRESSION/DISCECTOMY FUSION 4 LEVELS N/A 08/28/2019   Procedure: ANTERIOR CERVICAL DECOMPRESSION/DISCECTOMY FUSION CERVICALTHREE-FOUR CERVICAL,FOUR-FIVE,CERVICAL FIVE-SIX,CERVICAL SIX-SEVEN.;  Surgeon: Tia Alert, MD;  Location: [facility] OR;  Service: Neurosurgery;  Laterality: N/A;  ANTERIOR   BACK SURGERY     CATARACT EXTRACTION W/PHACO Right 03/31/2018   Procedure: CATARACT EXTRACTION PHACO AND INTRAOCULAR LENS PLACEMENT (IOC);  Surgeon: Elliot Cousin, MD;  Location: [facility] ORS;  Service: Ophthalmology;  Laterality: Right;  [place] 00:39.7 CDE 4.35 Fluid Pack Lot # [id number]   CHOLECYSTECTOMY  1995   COLONOSCOPY WITH PROPOFOL N/A 06/07/2017   Procedure: COLONOSCOPY WITH PROPOFOL;  Surgeon: Scot Jun, MD;  Location: [facility] ENDOSCOPY;  Service: Endoscopy;  Laterality: N/A;   ESOPHAGOGASTRODUODENOSCOPY (EGD) WITH PROPOFOL N/A 06/07/2017   Procedure: ESOPHAGOGASTRODUODENOSCOPY (EGD) WITH PROPOFOL;  Surgeon: Scot Jun, MD;  Location: [facility] ENDOSCOPY;  Service: Endoscopy;  Laterality: N/A;   IR CATHETER TUBE CHANGE  05/10/2020   REDUCTION MAMMAPLASTY Bilateral 1994   Past Surgical History:  Procedure Laterality Date   ANTERIOR CERVICAL DECOMPRESSION/DISCECTOMY FUSION 4 LEVELS N/A 08/28/2019   Procedure: ANTERIOR CERVICAL DECOMPRESSION/DISCECTOMY FUSION CERVICALTHREE-FOUR CERVICAL,FOUR-FIVE,CERVICAL FIVE-SIX,CERVICAL SIX-SEVEN.;  Surgeon: Tia Alert, MD;  Location: [facility] OR;  Service: Neurosurgery;  Laterality: N/A;  ANTERIOR   BACK SURGERY     CATARACT EXTRACTION W/PHACO Right 03/31/2018   Procedure: CATARACT EXTRACTION PHACO AND INTRAOCULAR LENS PLACEMENT (IOC);  Surgeon: Elliot Cousin, MD;  Location: [facility]  ORS;  Service: Ophthalmology;  Laterality: Right;  [place] 00:39.7 CDE 4.35 Fluid Pack Lot # [id number]   CHOLECYSTECTOMY  1995   COLONOSCOPY WITH PROPOFOL N/A 06/07/2017   Procedure: COLONOSCOPY WITH PROPOFOL;  Surgeon: Scot Jun, MD;  Location: [facility] ENDOSCOPY;  Service: Endoscopy;  Laterality: N/A;   ESOPHAGOGASTRODUODENOSCOPY (EGD) WITH PROPOFOL N/A 06/07/2017   Procedure: ESOPHAGOGASTRODUODENOSCOPY (EGD) WITH PROPOFOL;  Surgeon: Scot Jun, MD;  Location: [facility] ENDOSCOPY;  Service: Endoscopy;  Laterality: N/A;   IR CATHETER TUBE CHANGE  05/10/2020   REDUCTION MAMMAPLASTY Bilateral 1994   Past Medical History:  Diagnosis Date   Arthritis    GERD (gastroesophageal reflux disease)    Gout    Neuromuscular disorder (HCC)    Pre-diabetes    BP 100/85   Pulse 78   SpO2 98%   Opioid Risk Score:  Fall Risk Score:  `1  Depression screen PHQ 2/9     04/27/2023    1:55 PM 01/22/2023    2:02 PM 01/08/2023    2:17 PM 10/22/2022    1:59 PM 09/01/2022    1:55 PM 01/29/2022    3:46 PM 12/05/2021    1:12 PM  Depression screen PHQ 2/9  Decreased Interest 0 0 0 0 1 0 0  Down, Depressed, Hopeless 0 0 0 0 1 0 0  PHQ - 2 Score 0 0 0 0 2 0 0     Review of Systems  Musculoskeletal:  Positive for back pain.       Left leg pain  All other systems reviewed and are negative.     Objective:   Physical Exam  Awake, alert, appropriate, accompanied by daughter, NAD Between buttocks- on L side- skin bubbling- like a blister- couldn't see if was open or not Didn't see skin breakdown- on upper outer buttocks- and and top of crack, it looks OK.    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 And worsening spasticity.   Injury was  08/23/19! Here for f/u on SCI/spasticity, etc.   Need to do pressure relief every 15 minutes when in w/c- and in recliner.   2. Just let me know what company makes the w/c pressure relieving cushion   3. Will change Dantrolene to 100 mg  and increase dose 3x/day- (from 50 mg 2 tabs BID)- last got CMP checked 06/01/23- LFTs and Cr/BUN looked good- doesn't need labs for another 6 months.  If dantrolene afternoon dose, makes you sleepier, than go back to 2x/day.    4.  Will con't Baclofen 20 mg 3x/day-- doesn't need refills- filled 07/07/23   5.  Will renew Tramadol- 120 tabs- 5 refills- last refill 04/11/24- will send to new pharmacy.    6. Need to refill Paxil 20 mg daily- for nerve pain with duloxetine  7. Refill Duloxetine 60 mg daily- for nerve pain- wil not increase dose   8. Gabapentin wil refill 1200 mg 3x/day- for nerve pain with Duloxetine.    9. Try to send me a picture through mychart of wound of backside, please!   10.   Would do 1/2 dose miralax- the powder you put in a drink -- suggest 1/2 dose as needed initially and can fo up to 1/2 dose daily. 3 of your meds cause constipation. Dantrolene helps bowels, but other 3 don't.   11.   F/U in 3 months double appt- SCI   12. Oral drug screen- since has been 10 months since last Oral drug screen. Needs to be done- per clinic policy.    I spent a total of 44   minutes on total care today- >50% coordination of care- due to d/w pt about oral drug screen, bowels- wound; refills, increase in spasticity meds-

## 2023-07-16 NOTE — Patient Instructions (Signed)
 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 And worsening spasticity.   Injury was 08/23/19! Here for f/u on SCI/spasticity, etc.   Need to do pressure relief every 15 minutes when in w/c- and in recliner.   2. Just let me know what company makes the w/c pressure relieving cushion   3. Will change Dantrolene to 100 mg  and increase dose 3x/day- (from 50 mg 2 tabs BID)- last got CMP checked 06/01/23- LFTs and Cr/BUN looked good- doesn't need labs for another 6 months.  If dantrolene afternoon dose, makes you sleepier, than go back to 2x/day.    4.  Will con't Baclofen 20 mg 3x/day-- doesn't need refills- filled 07/07/23   5.  Will renew Tramadol- 120 tabs- 5 refills- last refill 04/11/24- will send to new pharmacy.    6. Need to refill Paxil 20 mg daily- for nerve pain with duloxetine  7. Refill Duloxetine 60 mg daily- for nerve pain- wil not increase dose   8. Gabapentin wil refill 1200 mg 3x/day- for nerve pain with Duloxetine.    9. Try to send me a picture through mychart of wound of backside, please!   10.   Would do 1/2 dose miralax- the powder you put in a drink -- suggest 1/2 dose as needed initially and can fo up to 1/2 dose daily. 3 of your meds cause constipation. Dantrolene helps bowels, but other 3 don't.   11.   F/U in 3 months double appt- SCI   12. Oral drug screen- since has been 10 months since last Oral drug screen.

## 2023-07-19 ENCOUNTER — Other Ambulatory Visit: Payer: Self-pay

## 2023-07-20 LAB — DRUG TOX MONITOR 1 W/CONF, ORAL FLD

## 2023-07-20 LAB — DRUG TOX ALC METAB W/CON, ORAL FLD: Alcohol Metabolite: NEGATIVE ng/mL (ref <25)

## 2023-07-23 ENCOUNTER — Other Ambulatory Visit: Payer: Self-pay

## 2023-07-23 MED ORDER — BACLOFEN 20 MG PO TABS: 20.0000 mg | ORAL_TABLET | Freq: Four times a day (QID) | ORAL | 0 refills | Status: DC

## 2023-07-26 ENCOUNTER — Ambulatory Visit: Admitting: Urology

## 2023-07-27 ENCOUNTER — Encounter: Payer: Self-pay | Admitting: Physical Medicine & Rehabilitation

## 2023-07-27 ENCOUNTER — Encounter: Payer: Medicare PPO | Admitting: Physical Medicine & Rehabilitation

## 2023-07-27 VITALS — BP 120/74 | HR 70 | Ht 63.0 in

## 2023-07-27 DIAGNOSIS — Z79891 Long term (current) use of opiate analgesic: Secondary | ICD-10-CM | POA: Diagnosis not present

## 2023-07-27 DIAGNOSIS — G8250 Quadriplegia, unspecified: Secondary | ICD-10-CM

## 2023-07-27 MED ORDER — ONABOTULINUMTOXINA 100 UNITS IJ SOLR
100.0000 [IU] | Freq: Once | INTRAMUSCULAR | Status: AC
  Administered 2023-07-27: 100 [IU] via INTRAMUSCULAR

## 2023-07-27 MED ORDER — SODIUM CHLORIDE (PF) 0.9 % IJ SOLN
4.0000 mL | Freq: Once | INTRAMUSCULAR | Status: AC
  Administered 2023-07-27: 4 mL via INTRAVENOUS

## 2023-07-27 NOTE — Progress Notes (Signed)
Botox Injection for spasticity using needle EMG guidance   Dilution: 50 Units/ml Indication: Severe spasticity which interferes with ADL,mobility and/or  hygiene and is unresponsive to medication management and other conservative care Informed consent was obtained after describing risks and benefits of the procedure with the patient. This includes bleeding, bruising, infection, excessive weakness, or medication side effects. A REMS form is on file and signed. Needle: 27g 1" needle electrode Number of units per muscle Total dose 200 Units LEFT Brachioradialis 25  FDS50 estim to isolate index finger FDP50 estim to isolate index finger   Lumbricals 25 x 3=75 Waste 0U All injections were done after obtaining appropriate EMG activity and after negative drawback for blood. The patient tolerated the procedure well. Post procedure instructions were given.

## 2023-07-27 NOTE — Patient Instructions (Signed)

## 2023-08-01 NOTE — Progress Notes (Unsigned)
Error

## 2023-08-02 ENCOUNTER — Ambulatory Visit: Admitting: Urology

## 2023-08-02 VITALS — BP 141/78 | HR 71

## 2023-08-02 DIAGNOSIS — Z9359 Other cystostomy status: Secondary | ICD-10-CM | POA: Diagnosis not present

## 2023-08-06 ENCOUNTER — Other Ambulatory Visit: Payer: Self-pay

## 2023-08-06 MED ORDER — TORSEMIDE 10 MG PO TABS
10.0000 mg | ORAL_TABLET | Freq: Every day | ORAL | 3 refills | Status: DC
Start: 2023-08-06 — End: 2023-09-10
  Filled 2023-08-06: qty 90, 90d supply, fill #0

## 2023-08-07 ENCOUNTER — Other Ambulatory Visit: Payer: Self-pay

## 2023-08-09 ENCOUNTER — Other Ambulatory Visit: Payer: Self-pay

## 2023-08-16 ENCOUNTER — Other Ambulatory Visit: Payer: Self-pay

## 2023-08-16 MED ORDER — OMEPRAZOLE 40 MG PO CPDR
40.0000 mg | DELAYED_RELEASE_CAPSULE | Freq: Every day | ORAL | 3 refills | Status: DC
  Filled 2023-08-16: qty 90, 90d supply, fill #0

## 2023-08-17 ENCOUNTER — Other Ambulatory Visit: Payer: Self-pay

## 2023-08-23 ENCOUNTER — Other Ambulatory Visit: Payer: Self-pay

## 2023-08-23 MED ORDER — LEVOTHYROXINE SODIUM 100 MCG PO TABS
100.0000 ug | ORAL_TABLET | Freq: Every day | ORAL | 3 refills | Status: DC
  Filled 2023-08-23: qty 90, 90d supply, fill #0

## 2023-08-24 ENCOUNTER — Other Ambulatory Visit: Payer: Self-pay

## 2023-08-30 ENCOUNTER — Ambulatory Visit: Admitting: Urology

## 2023-08-30 VITALS — BP 126/79 | HR 80

## 2023-08-30 DIAGNOSIS — Z9359 Other cystostomy status: Secondary | ICD-10-CM | POA: Diagnosis not present

## 2023-08-30 NOTE — Progress Notes (Signed)
 Suprapubic Cath Change  Patient is present today for a suprapubic catheter change due to urinary retention.  8 ml of water was drained from the balloon, a 16 Silastic FR foley cath was removed from the tract with out difficulty.  Site was cleaned and prepped in a sterile fashion with betadine .  A 16 Silastic FR foley cath was replaced into the tract no complications were noted. Urine return was noted, 10 ml of sterile water was inflated into the balloon and a night bag was attached for drainage.  Patient tolerated well.   Performed by: Matilde Son, PA-C and Anastasiya V. Hopkins, CMA    Follow up: Return in about 1 month (around 09/30/2023) for SPT exchange .

## 2023-09-05 ENCOUNTER — Inpatient Hospital Stay
Admission: EM | Admit: 2023-09-05 | Discharge: 2023-09-10 | DRG: 698 | Disposition: A | Discharge status: Hospice | Attending: Internal Medicine | Admitting: Internal Medicine

## 2023-09-05 ENCOUNTER — Encounter: Payer: Self-pay | Admitting: Internal Medicine

## 2023-09-05 ENCOUNTER — Emergency Department

## 2023-09-05 ENCOUNTER — Other Ambulatory Visit: Payer: Self-pay

## 2023-09-05 DIAGNOSIS — L89306 Pressure-induced deep tissue damage of unspecified buttock: Secondary | ICD-10-CM | POA: Diagnosis present

## 2023-09-05 DIAGNOSIS — Z789 Other specified health status: Secondary | ICD-10-CM

## 2023-09-05 DIAGNOSIS — M4802 Spinal stenosis, cervical region: Secondary | ICD-10-CM | POA: Diagnosis present

## 2023-09-05 DIAGNOSIS — N939 Abnormal uterine and vaginal bleeding, unspecified: Secondary | ICD-10-CM

## 2023-09-05 DIAGNOSIS — M109 Gout, unspecified: Secondary | ICD-10-CM | POA: Diagnosis present

## 2023-09-05 DIAGNOSIS — N390 Urinary tract infection, site not specified: Principal | ICD-10-CM | POA: Diagnosis present

## 2023-09-05 DIAGNOSIS — Z888 Allergy status to other drugs, medicaments and biological substances status: Secondary | ICD-10-CM

## 2023-09-05 DIAGNOSIS — E861 Hypovolemia: Secondary | ICD-10-CM | POA: Diagnosis present

## 2023-09-05 DIAGNOSIS — N319 Neuromuscular dysfunction of bladder, unspecified: Secondary | ICD-10-CM | POA: Diagnosis present

## 2023-09-05 DIAGNOSIS — E860 Dehydration: Secondary | ICD-10-CM | POA: Diagnosis present

## 2023-09-05 DIAGNOSIS — E876 Hypokalemia: Secondary | ICD-10-CM | POA: Diagnosis present

## 2023-09-05 DIAGNOSIS — R532 Functional quadriplegia: Secondary | ICD-10-CM | POA: Diagnosis present

## 2023-09-05 DIAGNOSIS — R7303 Prediabetes: Secondary | ICD-10-CM | POA: Diagnosis present

## 2023-09-05 DIAGNOSIS — L89226 Pressure-induced deep tissue damage of left hip: Secondary | ICD-10-CM | POA: Diagnosis present

## 2023-09-05 DIAGNOSIS — N950 Postmenopausal bleeding: Secondary | ICD-10-CM | POA: Diagnosis present

## 2023-09-05 DIAGNOSIS — T83511A Infection and inflammatory reaction due to indwelling urethral catheter, initial encounter: Secondary | ICD-10-CM | POA: Diagnosis not present

## 2023-09-05 DIAGNOSIS — N3000 Acute cystitis without hematuria: Secondary | ICD-10-CM | POA: Diagnosis present

## 2023-09-05 DIAGNOSIS — Z9359 Other cystostomy status: Secondary | ICD-10-CM

## 2023-09-05 DIAGNOSIS — G4700 Insomnia, unspecified: Secondary | ICD-10-CM | POA: Diagnosis present

## 2023-09-05 DIAGNOSIS — I5032 Chronic diastolic (congestive) heart failure: Secondary | ICD-10-CM | POA: Diagnosis present

## 2023-09-05 DIAGNOSIS — Z87440 Personal history of urinary (tract) infections: Secondary | ICD-10-CM

## 2023-09-05 DIAGNOSIS — G9341 Metabolic encephalopathy: Secondary | ICD-10-CM | POA: Diagnosis present

## 2023-09-05 DIAGNOSIS — K219 Gastro-esophageal reflux disease without esophagitis: Secondary | ICD-10-CM | POA: Diagnosis present

## 2023-09-05 DIAGNOSIS — Y846 Urinary catheterization as the cause of abnormal reaction of the patient, or of later complication, without mention of misadventure at the time of the procedure: Secondary | ICD-10-CM | POA: Diagnosis present

## 2023-09-05 DIAGNOSIS — Z993 Dependence on wheelchair: Secondary | ICD-10-CM

## 2023-09-05 DIAGNOSIS — B952 Enterococcus as the cause of diseases classified elsewhere: Secondary | ICD-10-CM | POA: Diagnosis present

## 2023-09-05 DIAGNOSIS — Z515 Encounter for palliative care: Secondary | ICD-10-CM

## 2023-09-05 DIAGNOSIS — Z8249 Family history of ischemic heart disease and other diseases of the circulatory system: Secondary | ICD-10-CM

## 2023-09-05 DIAGNOSIS — Z79899 Other long term (current) drug therapy: Secondary | ICD-10-CM

## 2023-09-05 DIAGNOSIS — Z66 Do not resuscitate: Secondary | ICD-10-CM | POA: Diagnosis present

## 2023-09-05 DIAGNOSIS — E871 Hypo-osmolality and hyponatremia: Secondary | ICD-10-CM | POA: Diagnosis present

## 2023-09-05 DIAGNOSIS — E039 Hypothyroidism, unspecified: Secondary | ICD-10-CM | POA: Diagnosis present

## 2023-09-05 DIAGNOSIS — Z79890 Hormone replacement therapy (postmenopausal): Secondary | ICD-10-CM

## 2023-09-05 DIAGNOSIS — L89216 Pressure-induced deep tissue damage of right hip: Secondary | ICD-10-CM | POA: Diagnosis present

## 2023-09-05 DIAGNOSIS — Z981 Arthrodesis status: Secondary | ICD-10-CM

## 2023-09-05 DIAGNOSIS — Z803 Family history of malignant neoplasm of breast: Secondary | ICD-10-CM

## 2023-09-05 LAB — URINALYSIS, ROUTINE W REFLEX MICROSCOPIC
Bilirubin Urine: NEGATIVE
Glucose, UA: NEGATIVE mg/dL
Hgb urine dipstick: NEGATIVE
Ketones, ur: 5 mg/dL — AB
Nitrite: NEGATIVE
Protein, ur: NEGATIVE mg/dL
Specific Gravity, Urine: 1.011 (ref 1.005–1.030)
WBC, UA: >50 WBC/hpf (ref 0–5)
pH: 6 (ref 5.0–8.0)

## 2023-09-05 LAB — CBC WITH DIFFERENTIAL/PLATELET
Abs Immature Granulocytes: 0.03 10*3/uL (ref 0.00–0.07)
Basophils Absolute: 0 10*3/uL (ref 0.0–0.1)
Basophils Relative: 0 %
Eosinophils Absolute: 0.1 10*3/uL (ref 0.0–0.5)
Eosinophils Relative: 1 %
HCT: 42.3 % (ref 36.0–46.0)
Hemoglobin: 13.6 g/dL (ref 12.0–15.0)
Immature Granulocytes: 1 %
Lymphocytes Relative: 14 %
Lymphs Abs: 0.8 10*3/uL (ref 0.7–4.0)
MCH: 27.5 pg (ref 26.0–34.0)
MCHC: 32.2 g/dL (ref 30.0–36.0)
MCV: 85.5 fL (ref 80.0–100.0)
Monocytes Absolute: 0.3 10*3/uL (ref 0.1–1.0)
Monocytes Relative: 6 %
Neutro Abs: 4.6 10*3/uL (ref 1.7–7.7)
Neutrophils Relative %: 78 %
Platelets: 159 10*3/uL (ref 150–400)
RBC: 4.95 MIL/uL (ref 3.87–5.11)
RDW: 13.2 % (ref 11.5–15.5)
WBC: 5.9 10*3/uL (ref 4.0–10.5)
nRBC: 0 % (ref 0.0–0.2)

## 2023-09-05 LAB — BASIC METABOLIC PANEL WITH GFR
Anion gap: 12 (ref 5–15)
BUN: 10 mg/dL (ref 8–23)
CO2: 32 mmol/L (ref 22–32)
Calcium: 9.3 mg/dL (ref 8.9–10.3)
Chloride: 87 mmol/L — ABNORMAL LOW (ref 98–111)
Creatinine, Ser: 0.73 mg/dL (ref 0.44–1.00)
GFR, Estimated: >60 mL/min (ref >60)
Glucose, Bld: 107 mg/dL — ABNORMAL HIGH (ref 70–99)
Potassium: 3.8 mmol/L (ref 3.5–5.1)
Sodium: 131 mmol/L — ABNORMAL LOW (ref 135–145)

## 2023-09-05 LAB — TROPONIN I (HIGH SENSITIVITY): Troponin I (High Sensitivity): 6 ng/L (ref <18)

## 2023-09-05 LAB — HEMOGLOBIN A1C
Hgb A1c MFr Bld: 4.9 % (ref 4.8–5.6)
Mean Plasma Glucose: 93.93 mg/dL

## 2023-09-05 LAB — GLUCOSE, CAPILLARY: Glucose-Capillary: 164 mg/dL — ABNORMAL HIGH (ref 70–99)

## 2023-09-05 MED ORDER — DULOXETINE HCL 30 MG PO CPEP
60.0000 mg | ORAL_CAPSULE | Freq: Every day | ORAL | Status: DC
  Administered 2023-09-05 – 2023-09-09 (×5): 60 mg via ORAL
  Filled 2023-09-05 (×5): qty 2

## 2023-09-05 MED ORDER — PAROXETINE HCL 20 MG PO TABS
20.0000 mg | ORAL_TABLET | Freq: Every day | ORAL | Status: DC
  Administered 2023-09-06 – 2023-09-10 (×5): 20 mg via ORAL
  Filled 2023-09-05 (×5): qty 1

## 2023-09-05 MED ORDER — POTASSIUM CHLORIDE CRYS ER 10 MEQ PO TBCR
10.0000 meq | EXTENDED_RELEASE_TABLET | Freq: Every day | ORAL | Status: DC
  Administered 2023-09-06 – 2023-09-09 (×4): 10 meq via ORAL
  Filled 2023-09-05 (×4): qty 1

## 2023-09-05 MED ORDER — GABAPENTIN 300 MG PO CAPS
1200.0000 mg | ORAL_CAPSULE | Freq: Three times a day (TID) | ORAL | Status: DC
  Administered 2023-09-05 – 2023-09-10 (×15): 1200 mg via ORAL
  Filled 2023-09-05 (×15): qty 4

## 2023-09-05 MED ORDER — ALBUTEROL SULFATE (2.5 MG/3ML) 0.083% IN NEBU: 2.5000 mg | INHALATION_SOLUTION | RESPIRATORY_TRACT | Status: DC | PRN

## 2023-09-05 MED ORDER — BACLOFEN 10 MG PO TABS
20.0000 mg | ORAL_TABLET | Freq: Four times a day (QID) | ORAL | Status: DC
  Administered 2023-09-05 – 2023-09-10 (×18): 20 mg via ORAL
  Filled 2023-09-05 (×18): qty 2

## 2023-09-05 MED ORDER — ENOXAPARIN SODIUM 40 MG/0.4ML IJ SOSY
40.0000 mg | PREFILLED_SYRINGE | INTRAMUSCULAR | Status: DC
  Administered 2023-09-06 – 2023-09-08 (×3): 40 mg via SUBCUTANEOUS
  Filled 2023-09-05 (×3): qty 0.4

## 2023-09-05 MED ORDER — PANTOPRAZOLE SODIUM 40 MG PO TBEC
40.0000 mg | DELAYED_RELEASE_TABLET | Freq: Every day | ORAL | Status: DC
  Administered 2023-09-05 – 2023-09-09 (×5): 40 mg via ORAL
  Filled 2023-09-05 (×5): qty 1

## 2023-09-05 MED ORDER — TRAMADOL HCL 50 MG PO TABS
50.0000 mg | ORAL_TABLET | Freq: Four times a day (QID) | ORAL | Status: DC | PRN
  Administered 2023-09-06 – 2023-09-09 (×3): 50 mg via ORAL
  Filled 2023-09-05 (×3): qty 1

## 2023-09-05 MED ORDER — CIPROFLOXACIN IN D5W 200 MG/100ML IV SOLN
200.0000 mg | Freq: Once | INTRAVENOUS | Status: AC
  Administered 2023-09-05: 200 mg via INTRAVENOUS
  Filled 2023-09-05: qty 100

## 2023-09-05 MED ORDER — METOLAZONE 2.5 MG PO TABS
1.2500 mg | ORAL_TABLET | ORAL | Status: DC
  Administered 2023-09-06 – 2023-09-08 (×2): 1.25 mg via ORAL
  Filled 2023-09-05 (×2): qty 0.5

## 2023-09-05 MED ORDER — CIPROFLOXACIN IN D5W 200 MG/100ML IV SOLN
200.0000 mg | Freq: Two times a day (BID) | INTRAVENOUS | Status: DC
  Administered 2023-09-05 – 2023-09-06 (×3): 200 mg via INTRAVENOUS
  Filled 2023-09-05 (×4): qty 100

## 2023-09-05 MED ORDER — ACETAMINOPHEN 650 MG RE SUPP: 650.0000 mg | Freq: Four times a day (QID) | RECTAL | Status: DC | PRN

## 2023-09-05 MED ORDER — ACETAMINOPHEN 325 MG PO TABS: 650.0000 mg | ORAL_TABLET | Freq: Four times a day (QID) | ORAL | Status: DC | PRN

## 2023-09-05 MED ORDER — TORSEMIDE 20 MG PO TABS
10.0000 mg | ORAL_TABLET | Freq: Every day | ORAL | Status: DC
  Administered 2023-09-06 – 2023-09-08 (×3): 10 mg via ORAL
  Filled 2023-09-05 (×3): qty 1

## 2023-09-05 MED ORDER — ONDANSETRON HCL 4 MG/2ML IJ SOLN: 4.0000 mg | Freq: Four times a day (QID) | INTRAMUSCULAR | Status: DC | PRN

## 2023-09-05 MED ORDER — LEVOTHYROXINE SODIUM 100 MCG PO TABS
100.0000 ug | ORAL_TABLET | Freq: Every day | ORAL | Status: DC
  Administered 2023-09-06 – 2023-09-10 (×5): 100 ug via ORAL
  Filled 2023-09-05 (×6): qty 1

## 2023-09-05 MED ORDER — DANTROLENE SODIUM 100 MG PO CAPS
100.0000 mg | ORAL_CAPSULE | Freq: Three times a day (TID) | ORAL | Status: DC
  Administered 2023-09-05 – 2023-09-10 (×15): 100 mg via ORAL
  Filled 2023-09-05 (×18): qty 1

## 2023-09-05 MED ORDER — INSULIN ASPART 100 UNIT/ML IJ SOLN: 0.0000 [IU] | Freq: Three times a day (TID) | INTRAMUSCULAR | Status: DC

## 2023-09-05 MED ORDER — INSULIN ASPART 100 UNIT/ML IJ SOLN: 0.0000 [IU] | Freq: Every day | INTRAMUSCULAR | Status: DC

## 2023-09-05 MED ORDER — ONDANSETRON HCL 4 MG PO TABS: 4.0000 mg | ORAL_TABLET | Freq: Four times a day (QID) | ORAL | Status: DC | PRN

## 2023-09-05 NOTE — ED Triage Notes (Signed)
 Pt arrives via [facility] from home for vaginal discharge, bleeding, and concern for UTI. 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 Pt states that approximately 4 months ago she had a UTI and doctor gave her an extra prescription of Ciprofloxacin  in case family had concern for recurring UTI and she started that yesterday.

## 2023-09-05 NOTE — ED Provider Notes (Signed)
 [facility] Provider Note    Event Date/Time   First MD Initiated Contact with Patient 09/05/23 1131     (approximate)   History   Vaginal Bleeding and Urinary Tract Infection   HPI Meredith Roach is a 83 y.o. female with history of neurogenic bladder, chronic suprapubic catheter presenting today for possible UTI and vaginal bleeding.  Family had noted that she has become weaker over the past several days at home.  She is nonambulatory at baseline and is functionally quadriplegic.  They were concerned she had a UTI and started her on ciprofloxacin  yesterday.  They then started to notice vaginal bleeding that occurred last night and into today.  Also worried about bedsores on her.  They otherwise deny any other acute symptoms like cough, congestion, shortness of breath, chest pain, abdominal pain, nausea, vomiting, diarrhea.     Physical Exam   Triage Vital Signs: ED Triage Vitals  Encounter Vitals Group     BP 09/05/23 1141 111/69     Systolic BP Percentile --      Diastolic BP Percentile --      Pulse Rate 09/05/23 1141 85     Resp 09/05/23 1141 12     Temp 09/05/23 1141 97.7 F (36.5 C)     Temp Source 09/05/23 1141 Oral     SpO2 09/05/23 1141 97 %     Weight --      Height --      Head Circumference --      Peak Flow --      Pain Score 09/05/23 1134 0     Pain Loc --      Pain Education --      Exclude from Growth Chart --     Most recent vital signs: Vitals:   09/05/23 1530 09/05/23 1541  BP: (!) 121/59   Pulse: 81   Resp: (!) 9   Temp:  98 F (36.7 C)  SpO2: 97%    I have reviewed the vital signs. General:  Awake, alert, no acute distress. Head:  Normocephalic, Atraumatic. EENT:  PERRL, EOMI, Oral mucosa pink and moist, Neck is supple. Cardiovascular: Regular rate, 2+ distal pulses. Respiratory:  Normal respiratory effort, symmetrical expansion, no distress.   Abdomen: Soft, nontender, nondistended.  Suprapubic catheter in  place with no surrounding erythema or other drainage Extremities:  Moving all four extremities through full ROM without pain.   Neuro:  Alert and oriented.  Interacting appropriately.   Skin:  Warm, dry, no rash.   Psych: Appropriate affect.    ED Results / Procedures / Treatments   Labs (all labs ordered are listed, but only abnormal results are displayed) Labs Reviewed  URINALYSIS, ROUTINE W REFLEX MICROSCOPIC - Abnormal; Notable for the following components:      Result Value   Color, Urine YELLOW (*)    APPearance CLOUDY (*)    Ketones, ur 5 (*)    Leukocytes,Ua LARGE (*)    Bacteria, UA MANY (*)    All other components within normal limits  BASIC METABOLIC PANEL WITH GFR - Abnormal; Notable for the following components:   Sodium 131 (*)    Chloride 87 (*)    Glucose, Bld 107 (*)    All other components within normal limits  URINE CULTURE  CBC WITH DIFFERENTIAL/PLATELET  TROPONIN I (HIGH SENSITIVITY)     EKG    RADIOLOGY Independently interpreted ultrasound with evidence of endometrial thickening   PROCEDURES:  Critical Care performed: No  Procedures   MEDICATIONS ORDERED IN ED: Medications  ciprofloxacin  (CIPRO ) IVPB 200 mg (has no administration in time range)     IMPRESSION / MDM / ASSESSMENT AND PLAN / ED COURSE  I reviewed the triage vital signs and the nursing notes.                              Differential diagnosis includes, but is not limited to, UTI, fibroid, adenoma, abnormal uterine bleeding, dehydration  Patient's presentation is most consistent with acute complicated illness / injury requiring diagnostic workup.  Patient is an 83 year old female presenting today for possible UTI in the setting of suprapubic catheter and vaginal bleeding.  Exam outside of global weakness is rather unremarkable and vital signs are stable.  UA does show signs of a UTI although difficult to tell if this is colonization versus acute infection.  However given  her worsening weakness, may be the source of the symptoms.  CBC and BMP otherwise largely reassuring.  Given her vaginal bleeding, ultrasound was performed which shows endometrial thickening but no other acute pathology.  Discussed this with patient and family at the bedside.  They are very worried given her increasing weakness over the past 7 days along with a UTI, her inability to move on her own, and worsening bedsores about her care at home.  They would prefer she gets admitted until she gets stronger as she lives at home with her husband who is not able to take care of her.  Paged out to hospitalist for consultation.  Hospitalist has agreed to admit for ongoing care.  Started on IV ciprofloxacin .  The patient is on the cardiac monitor to evaluate for evidence of arrhythmia and/or significant heart rate changes.     FINAL CLINICAL IMPRESSION(S) / ED DIAGNOSES   Final diagnoses:  Complicated UTI (urinary tract infection)  Vaginal bleeding     Rx / DC Orders   ED Discharge Orders     None        Note:  This document was prepared using Dragon voice recognition software and may include unintentional dictation errors.   Kandee Orion, MD 09/05/23 [phone]

## 2023-09-05 NOTE — Consult Note (Signed)
 WOC team consulted for pressure injuries, per flowsheet DTPI B hips and buttocks wounds.  Secure chat to primary team requesting photo documentation be placed in chart.   Please note that the [facility] nursing team is utilizing a standardized work plan to manage patient consults. We are triaging consults and will try to see the patients within 48 hours. Wound photos in the patient's chart allow us  to consult on the patient in the most efficient and timely manner.    Thank you,    Ronni Colace MSN, RN-BC, [employer] [phone]

## 2023-09-05 NOTE — H&P (Addendum)
 History and Physical  Meredith Roach:[id number] DOB: 1940/11/16 DOA: 09/05/2023  PCP: Sari Cunning, MD   Chief Complaint: Weakness, vaginal bleeding  HPI: Meredith Roach is a 83 y.o. female with medical history significant for incomplete quadriplegia, wheelchair dependent with chronic suprapubic catheter being admitted to the hospital with concern for UTI in the setting of weakness and vaginal bleeding.  History is mainly provided by the patient's daughter who is at the bedside and very closely involved in her care.  Remarkably, it seems the patient has only had 1 significant UTI since her injury in 2021.  She has had a suprapubic catheter for about 3 years.  It is changed on a regular basis, was last done about 1 week ago.  For the last week or so, family has noted that the patient has been more weak than usual.  Typically, they are able to get her in a lift and move her around with some assistance from her, but in the last few days she has not been able to assist.  Daughter also states that patient is insisting that she has a foreign object in her mouth, even though there is nothing present there interestingly, she had similar complaints when she had a UTI in the past.  In any case, both the patient and her daughter deny any fevers, chills, nausea, vomiting, pain.  They have noticed for the last week she has had some intermittent spotting of blood from the vagina, there is no pain associated with it.  Review of Systems: Please see HPI for pertinent positives and negatives. A complete 10 system review of systems are otherwise negative.  Past Medical History:  Diagnosis Date   Arthritis    GERD (gastroesophageal reflux disease)    Gout    Neuromuscular disorder (HCC)    Pre-diabetes    Past Surgical History:  Procedure Laterality Date   ANTERIOR CERVICAL DECOMPRESSION/DISCECTOMY FUSION 4 LEVELS N/A 08/28/2019   Procedure: ANTERIOR CERVICAL DECOMPRESSION/DISCECTOMY FUSION  CERVICALTHREE-FOUR CERVICAL,FOUR-FIVE,CERVICAL FIVE-SIX,CERVICAL SIX-SEVEN.;  Surgeon: Isadora Mar, MD;  Location: [facility] [address] OR;  Service: Neurosurgery;  Laterality: N/A;  ANTERIOR   BACK SURGERY     CATARACT EXTRACTION W/PHACO Right 03/31/2018   Procedure: CATARACT EXTRACTION PHACO AND INTRAOCULAR LENS PLACEMENT (IOC);  Surgeon: Ola Berger, MD;  Location: [facility] ORS;  Service: Ophthalmology;  Laterality: Right;  US  00:39.7 CDE 4.35 Fluid Pack Lot # [id number]   CHOLECYSTECTOMY  1995   COLONOSCOPY WITH PROPOFOL  N/A 06/07/2017   Procedure: COLONOSCOPY WITH PROPOFOL ;  Surgeon: Cassie Click, MD;  Location[facility] ENDOSCOPY;  Service: Endoscopy;  Laterality: N/A;   ESOPHAGOGASTRODUODENOSCOPY (EGD) WITH PROPOFOL  N/A 06/07/2017   Procedure: ESOPHAGOGASTRODUODENOSCOPY (EGD) WITH PROPOFOL ;  Surgeon: Cassie Click, MD;  Location: [facility] ENDOSCOPY;  Service: Endoscopy;  Laterality: N/A;   IR CATHETER TUBE CHANGE  05/10/2020   REDUCTION MAMMAPLASTY Bilateral 1994   Social History:  reports that she has never smoked. She has never been exposed to tobacco smoke. She has never used smokeless tobacco. She reports that she does not drink alcohol and does not use drugs.  Allergies  Allergen Reactions   Ace Inhibitors Anaphylaxis    Family History  Problem Relation Age of Onset   Breast cancer Maternal Aunt    Breast cancer Paternal Aunt    CAD Father    Heart attack Father      Prior to Admission medications   Medication Sig Start Date End Date Taking? Authorizing Provider  baclofen  (LIORESAL ) 20 MG  tablet Take 1 tablet (20 mg total) by mouth 4 (four) times daily. 07/06/23   Lovorn, Megan, MD  cyproheptadine  (PERIACTIN ) 4 MG tablet Take 1 tablet (4 mg total) by mouth 3 (three) times daily as needed (baclofen  withdrawal). 06/16/22   Lovorn, Megan, MD  dantrolene  (DANTRIUM ) 100 MG capsule Take 1 capsule (100 mg total) by mouth 3 (three) times daily. With Baclofen - Zanaflex  causes sedation 07/16/23   Lovorn,  Megan, MD  DULoxetine  (CYMBALTA ) 60 MG capsule Take 1 capsule (60 mg total) by mouth at bedtime. 07/16/23   Lovorn, Megan, MD  gabapentin  (NEURONTIN ) 600 MG tablet Take 2 tablets (1,200 mg total) by mouth 3 (three) times daily. 07/16/23   Lovorn, Megan, MD  levothyroxine  (SYNTHROID ) 100 MCG tablet Take 1 tablet (100 mcg total) by mouth daily. Take on an empty stomach with a glass of water at least 30-60 minutes before breakfast. 08/23/23     lidocaine  (XYLOCAINE ) 5 % ointment Apply 1 application. topically as needed. To LLE- no more than foot/ankle/front of leg to knee at a time- up to 4x/day 09/01/21   Lovorn, Megan, MD  metolazone (ZAROXOLYN) 2.5 MG tablet Take 1.25 mg by mouth 3 (three) times a week. 10/09/22 10/09/23  [provider]  omeprazole  (PRILOSEC) 40 MG capsule Take 1 capsule (40 mg total) by mouth daily. 08/16/23     PARoxetine  (PAXIL ) 20 MG tablet Take 1 tablet (20 mg total) by mouth daily. 07/16/23   Lovorn, Megan, MD  potassium chloride  (KLOR-CON ) 8 MEQ tablet Take 8 mEq by mouth daily. 10/08/22 10/08/23  [provider]  torsemide  (DEMADEX ) 10 MG tablet Take 1 tablet (10 mg total) by mouth once daily 08/06/23     traMADol  (ULTRAM ) 50 MG tablet Take 1 tablet (50 mg total) by mouth every 6 (six) hours as needed. 07/16/23   Lovorn, Jacqlyn Matas, MD    Physical Exam: BP (!) 126/51   Pulse 86   Temp 98 F (36.7 C)   Resp 13   SpO2 96%  General:  Alert, oriented, calm, in no acute distress, quadriplegic Eyes: EOMI, clear conjuctivae, white sclerea Neck: supple, no masses, trachea mildline  Cardiovascular: RRR, no murmurs or rubs, no peripheral edema  Respiratory: clear to auscultation bilaterally, no wheezes, no crackles  Abdomen: soft, nontender, nondistended, normal bowel tones heard  Skin: dry, no rashes  Musculoskeletal: no joint effusions, normal range of motion  Psychiatric: appropriate affect, normal speech  Neurologic: extraocular muscles intact, clear speech, moving all  extremities with intact sensorium         Labs on Admission:  Basic Metabolic Panel: Recent Labs  Lab 09/05/23 1232  NA 131*  K 3.8  CL 87*  CO2 32  GLUCOSE 107*  BUN 10  CREATININE 0.73  CALCIUM 9.3   Liver Function Tests: No results for input(s): "AST", "ALT", "ALKPHOS", "BILITOT", "PROT", "ALBUMIN " in the last 168 hours. No results for input(s): "LIPASE", "AMYLASE" in the last 168 hours. No results for input(s): "AMMONIA" in the last 168 hours. CBC: Recent Labs  Lab 09/05/23 1232  WBC 5.9  NEUTROABS 4.6  HGB 13.6  HCT 42.3  MCV 85.5  PLT 159   Cardiac Enzymes: No results for input(s): "CKTOTAL", "CKMB", "CKMBINDEX", "TROPONINI" in the last 168 hours. BNP (last 3 results) No results for input(s): "BNP" in the last 8760 hours.  ProBNP (last 3 results) No results for input(s): "PROBNP" in the last 8760 hours.  CBG: No results for input(s): "GLUCAP" in the last 168  hours.  Radiological Exams on Admission: US  PELVIS (TRANSABDOMINAL ONLY) Result Date: 09/05/2023 CLINICAL DATA:  Vaginal bleeding EXAM: TRANSABDOMINAL ULTRASOUND OF PELVIS TECHNIQUE: Transabdominal ultrasound examination of the pelvis was performed including evaluation of the uterus, ovaries, adnexal regions, and pelvic cul-de-sac. COMPARISON:  CT 03/28/2020 and previous FINDINGS: Uterus Measurements: 6.2 x 2.9 x 4.6 cm = volume: 43 mL. Anteverted. No fibroids or other mass visualized. Endometrium Thickness: 8 mm.  No focal abnormality visualized. Right ovary Not identified Left ovary Not visualized No free fluid.  Suprapubic bladder catheter incidentally noted. IMPRESSION: 1. Negative for uterine mass 2. Endometrial thickening. 3. Nonvisualized ovaries. Electronically Signed   By: Nicoletta Barrier M.D.   On: 09/05/2023 15:36   Assessment/Plan Meredith Roach is a 83 y.o. female with medical history significant for incomplete quadriplegia, wheelchair dependent with chronic suprapubic catheter being admitted to the  hospital with concern for UTI in the setting of weakness and vaginal bleeding.  UTI-concerning due to weakness and mild confusion, which are consistent with her prior UTI. -Observation admission -Empiric IV Cipro  -Follow-up urine culture  [facility] it difficult for family to care for her at home, may be related to UTI which is being treated as above.  If not improving, she may benefit from higher level of care to avoid bedsores and other complications  Diastolic CHF-continue metolazone and torsemide   Hyponatremia-mild, monitor with daily labs  Vaginal bleeding-not severe, not related to UTI.  No obvious uterine mass on transabdominal pelvic ultrasound, some endometrial thickening. -Recommend outpatient GYN follow-up  Hypothyroidism-Synthroid   Incomplete quadriplegia-continue Neurontin , Cymbalta , Paxil , baclofen   GERD-omeprazole   DVT prophylaxis: Lovenox      Code Status: Full Code  Consults called: None  Admission status: Observation  Time spent: 45 minutes  Meredith Roach Charm MD [facility] Pager [phone]  If 7PM-7AM, please contact night-coverage [email or web address] Password [facility]  09/05/2023, 4:24 PM

## 2023-09-06 DIAGNOSIS — L89226 Pressure-induced deep tissue damage of left hip: Secondary | ICD-10-CM | POA: Diagnosis present

## 2023-09-06 DIAGNOSIS — G9341 Metabolic encephalopathy: Secondary | ICD-10-CM | POA: Diagnosis present

## 2023-09-06 DIAGNOSIS — I5032 Chronic diastolic (congestive) heart failure: Secondary | ICD-10-CM | POA: Diagnosis present

## 2023-09-06 DIAGNOSIS — E871 Hypo-osmolality and hyponatremia: Secondary | ICD-10-CM | POA: Diagnosis present

## 2023-09-06 DIAGNOSIS — E039 Hypothyroidism, unspecified: Secondary | ICD-10-CM | POA: Diagnosis present

## 2023-09-06 DIAGNOSIS — Z9359 Other cystostomy status: Secondary | ICD-10-CM

## 2023-09-06 DIAGNOSIS — N939 Abnormal uterine and vaginal bleeding, unspecified: Secondary | ICD-10-CM | POA: Diagnosis present

## 2023-09-06 DIAGNOSIS — N3000 Acute cystitis without hematuria: Secondary | ICD-10-CM | POA: Diagnosis present

## 2023-09-06 DIAGNOSIS — G4700 Insomnia, unspecified: Secondary | ICD-10-CM | POA: Diagnosis present

## 2023-09-06 DIAGNOSIS — Z8249 Family history of ischemic heart disease and other diseases of the circulatory system: Secondary | ICD-10-CM | POA: Diagnosis not present

## 2023-09-06 DIAGNOSIS — Z79890 Hormone replacement therapy (postmenopausal): Secondary | ICD-10-CM | POA: Diagnosis not present

## 2023-09-06 DIAGNOSIS — Z66 Do not resuscitate: Secondary | ICD-10-CM | POA: Diagnosis present

## 2023-09-06 DIAGNOSIS — L89216 Pressure-induced deep tissue damage of right hip: Secondary | ICD-10-CM | POA: Diagnosis present

## 2023-09-06 DIAGNOSIS — N319 Neuromuscular dysfunction of bladder, unspecified: Secondary | ICD-10-CM | POA: Diagnosis present

## 2023-09-06 DIAGNOSIS — G8250 Quadriplegia, unspecified: Secondary | ICD-10-CM

## 2023-09-06 DIAGNOSIS — T83511A Infection and inflammatory reaction due to indwelling urethral catheter, initial encounter: Secondary | ICD-10-CM | POA: Diagnosis present

## 2023-09-06 DIAGNOSIS — N950 Postmenopausal bleeding: Secondary | ICD-10-CM | POA: Diagnosis present

## 2023-09-06 DIAGNOSIS — B952 Enterococcus as the cause of diseases classified elsewhere: Secondary | ICD-10-CM | POA: Diagnosis present

## 2023-09-06 DIAGNOSIS — Z7189 Other specified counseling: Secondary | ICD-10-CM | POA: Diagnosis not present

## 2023-09-06 DIAGNOSIS — Z888 Allergy status to other drugs, medicaments and biological substances status: Secondary | ICD-10-CM | POA: Diagnosis not present

## 2023-09-06 DIAGNOSIS — Z515 Encounter for palliative care: Secondary | ICD-10-CM | POA: Diagnosis not present

## 2023-09-06 DIAGNOSIS — Z87440 Personal history of urinary (tract) infections: Secondary | ICD-10-CM | POA: Diagnosis not present

## 2023-09-06 DIAGNOSIS — Z993 Dependence on wheelchair: Secondary | ICD-10-CM | POA: Diagnosis not present

## 2023-09-06 DIAGNOSIS — Z789 Other specified health status: Secondary | ICD-10-CM | POA: Diagnosis not present

## 2023-09-06 DIAGNOSIS — K219 Gastro-esophageal reflux disease without esophagitis: Secondary | ICD-10-CM | POA: Diagnosis present

## 2023-09-06 DIAGNOSIS — E860 Dehydration: Secondary | ICD-10-CM | POA: Diagnosis present

## 2023-09-06 DIAGNOSIS — R532 Functional quadriplegia: Secondary | ICD-10-CM | POA: Diagnosis present

## 2023-09-06 DIAGNOSIS — E876 Hypokalemia: Secondary | ICD-10-CM | POA: Diagnosis present

## 2023-09-06 DIAGNOSIS — Y846 Urinary catheterization as the cause of abnormal reaction of the patient, or of later complication, without mention of misadventure at the time of the procedure: Secondary | ICD-10-CM | POA: Diagnosis present

## 2023-09-06 LAB — BASIC METABOLIC PANEL WITH GFR
Anion gap: 11 (ref 5–15)
BUN: 11 mg/dL (ref 8–23)
CO2: 33 mmol/L — ABNORMAL HIGH (ref 22–32)
Calcium: 8.9 mg/dL (ref 8.9–10.3)
Chloride: 85 mmol/L — ABNORMAL LOW (ref 98–111)
Creatinine, Ser: 0.71 mg/dL (ref 0.44–1.00)
GFR, Estimated: >60 mL/min (ref >60)
Glucose, Bld: 81 mg/dL (ref 70–99)
Potassium: 3.2 mmol/L — ABNORMAL LOW (ref 3.5–5.1)
Sodium: 129 mmol/L — ABNORMAL LOW (ref 135–145)

## 2023-09-06 LAB — GLUCOSE, CAPILLARY
Glucose-Capillary: 106 mg/dL — ABNORMAL HIGH (ref 70–99)
Glucose-Capillary: 94 mg/dL (ref 70–99)
Glucose-Capillary: 103 mg/dL — ABNORMAL HIGH (ref 70–99)
Glucose-Capillary: 111 mg/dL — ABNORMAL HIGH (ref 70–99)

## 2023-09-06 LAB — CBC
HCT: 38.1 % (ref 36.0–46.0)
Hemoglobin: 12.6 g/dL (ref 12.0–15.0)
MCH: 27.7 pg (ref 26.0–34.0)
MCHC: 33.1 g/dL (ref 30.0–36.0)
MCV: 83.7 fL (ref 80.0–100.0)
Platelets: 177 10*3/uL (ref 150–400)
RBC: 4.55 MIL/uL (ref 3.87–5.11)
RDW: 13.2 % (ref 11.5–15.5)
WBC: 5.7 10*3/uL (ref 4.0–10.5)
nRBC: 0 % (ref 0.0–0.2)

## 2023-09-06 MED ORDER — JUVEN PO PACK
1.0000 | PACK | Freq: Two times a day (BID) | ORAL | Status: DC
  Administered 2023-09-06 – 2023-09-10 (×7): 1 via ORAL

## 2023-09-06 MED ORDER — MEDIHONEY WOUND/BURN DRESSING EX PSTE
1.0000 | PASTE | Freq: Every day | CUTANEOUS | Status: DC
  Administered 2023-09-08 – 2023-09-10 (×3): 1 via TOPICAL
  Filled 2023-09-06: qty 44

## 2023-09-06 MED ORDER — ADULT MULTIVITAMIN W/MINERALS CH
1.0000 | ORAL_TABLET | Freq: Every day | ORAL | Status: DC
  Administered 2023-09-06 – 2023-09-09 (×4): 1 via ORAL
  Filled 2023-09-06 (×4): qty 1

## 2023-09-06 MED ORDER — VITAMIN C 500 MG PO TABS
500.0000 mg | ORAL_TABLET | Freq: Two times a day (BID) | ORAL | Status: DC
  Administered 2023-09-06 – 2023-09-09 (×7): 500 mg via ORAL
  Filled 2023-09-06 (×7): qty 1

## 2023-09-06 MED ORDER — ZINC SULFATE 220 (50 ZN) MG PO CAPS
220.0000 mg | ORAL_CAPSULE | Freq: Every day | ORAL | Status: DC
  Administered 2023-09-06 – 2023-09-09 (×4): 220 mg via ORAL
  Filled 2023-09-06 (×4): qty 1

## 2023-09-06 NOTE — Progress Notes (Signed)
 Initial Nutrition Assessment  DOCUMENTATION CODES:   Not applicable  INTERVENTION:   -Continue regular diet -MVI with minerals daily -500 mg vitamin C BID -220 mg zinc sulfate daily x 14 days -Check labs and replete as appropriate for possible micronutrient deficiencies which may impair wound healing: vitamin A -1 packet Juven BID, each packet provides 95 calories, 2.5 grams of protein (collagen), and 9.8 grams of carbohydrate (3 grams sugar); also contains 7 grams of L-arginine and L-glutamine, 300 mg vitamin C, 15 mg vitamin E, 1.2 mcg vitamin B-12, 9.5 mg zinc, 200 mg calcium, and 1.5 g  Calcium Beta-hydroxy-Beta-methylbutyrate to support wound healing   NUTRITION DIAGNOSIS:   Increased nutrient needs related to wound healing as evidenced by estimated needs.  GOAL:   Patient will meet greater than or equal to 90% of their needs  MONITOR:   PO intake, Supplement acceptance  REASON FOR ASSESSMENT:   Low Braden    ASSESSMENT:   Pt with medical history significant for incomplete quadriplegia, wheelchair dependent with chronic suprapubic catheter being admitted to the hospital with concern for UTI in the setting of weakness and vaginal bleeding.  Pt admitted with UTI.   Reviewed I/O's: -1.3 L x 24 hours  UOP: 1.4 L x 24 hours  Pt unavailable at time of visit. Attempted to speak with pt via call to hospital room phone, however, unable to reach. RD unable to obtain further nutrition-related history or complete nutrition-focused physical exam at this time.    Per [facility] notes, pt with stage 3 pressure injury to buttocks/ coccyx and DTPI to rt trochanter.   Per H&P, pt is a functional quadriplegic and lives with her daughter, who cares for her. Pt has experienced increased weakness over the past week PTA.   Pt currently on a regular diet. Noted meal completions 90%.   Medications reviewed and include lovenox , neurontin , protonix , potassium chloride , demadex , and cipro .    Labs reviewed: Na: 129, K: 3.2, CBGS: 94-164 (inpatient orders for glycemic control are none).    Diet Order:   Diet Order             Diet regular Room service appropriate? Yes; Fluid consistency: Thin  Diet effective now                   EDUCATION NEEDS:   No education needs have been identified at this time  Skin:  Skin Assessment: Skin Integrity Issues: Skin Integrity Issues:: Stage III, DTI DTI: rt trochanter Stage III: buttocks/ coccyx  Last BM:  09/06/23  Height:   Ht Readings from Last 1 Encounters:  09/05/23 5\' 2"  (1.575 m)    Weight:   Wt Readings from Last 1 Encounters:  09/05/23 71.4 kg    Ideal Body Weight:  50 kg  BMI:  Body mass index is 28.79 kg/m.  Estimated Nutritional Needs:   Kcal:  1550-1750  Protein:  85-100 grams  Fluid:  1.5-1.7 L    Herschel Lords, RD, LDN, CDCES Registered Dietitian III Certified Diabetes Care and Education Specialist If unable to reach this RD, please use "RD Inpatient" group chat on secure chat between hours of 8am-4 pm daily

## 2023-09-06 NOTE — TOC Initial Note (Addendum)
 Transition of Care [facility]) - Initial/Assessment Note    Patient Details  Name: Meredith Roach MRN: [id number] Date of Birth: 10-Sep-1940  [facility] of [facility]) CM/SW Contact:    Odilia Bennett, LCSW Phone Number: 09/06/2023, 12:33 PM  Clinical Narrative:   CSW met with patient. Daughter at bedside. CSW introduced role and explained that discharge planning would be discussed. Patient is active with [facility] for nursing. CSW sent secure email to liaison to see if they can change her suprapubic catheter at home rather than patient having to go to an outpatient office once per month, per daughter request. Discussed transportation home at discharge. Daughter stated patient will need ambulance transport due to inability to sit where pressure injury is located. Address on facesheet is correct. No further concerns. CSW will continue to follow patient and her family for support and facilitate return home once stable.              1:30 pm: [facility] can perform suprapubic catheter change at home as long as the following documentation is in the home health order: "New nursing order for changing catheter, how often and if any s/s of infection to go to ED, etc.  please ask them to be sure to notate size and type if she prefers specific type/brand as well as last date of change." MD is aware.  2:30 pm: Home health order is in. [facility] liaison, patient, and family are aware.  Expected Discharge Plan: Home w Home Health Services Barriers to Discharge: Continued Medical Work up   Patient Goals and CMS Choice     Choice offered to / list presented to : Patient, Adult Children      Expected Discharge Plan and Services     Post Acute Care Choice: Resumption of Svcs/PTA Provider Living arrangements for the past 2 months: Single Family Home                           HH Arranged: RN HH Agency: [facility] Date HH Agency Contacted: 09/06/23    Representative spoke with at [facility] Agency: Shaun  Prior Living Arrangements/Services Living arrangements for the past 2 months: Single Family Home   Patient language and need for interpreter reviewed:: Yes Do you feel safe going back to the place where you live?: Yes      Need for Family Participation in Patient Care: Yes (Comment) Care giver support system in place?: Yes (comment)   Criminal Activity/Legal Involvement Pertinent to Current Situation/Hospitalization: No - Comment as needed  Activities of Daily Living   ADL Screening (condition at time of admission) Independently performs ADLs?: No Does the patient have a NEW difficulty with bathing/dressing/toileting/self-feeding that is expected to last >3 days?: No Does the patient have a NEW difficulty with getting in/out of bed, walking, or climbing stairs that is expected to last >3 days?: No Does the patient have a NEW difficulty with communication that is expected to last >3 days?: No Is the patient deaf or have difficulty hearing?: No Does the patient have difficulty seeing, even when wearing glasses/contacts?: No Does the patient have difficulty concentrating, remembering, or making decisions?: No  Permission Sought/Granted Permission sought to share information with : Facility Medical sales representative, Family Supports Permission granted to share information with : Yes, Verbal Permission Granted     Permission granted to share info w AGENCY: [facility]  Permission granted to share info w Relationship:  Daughter     Emotional Assessment Appearance:: Appears stated age Attitude/Demeanor/Rapport: Engaged, Gracious Affect (typically observed): Accepting, Appropriate, Calm, Pleasant Orientation: : Oriented to Self, Oriented to Place, Oriented to  Time, Oriented to Situation Alcohol / Substance Use: Not Applicable Psych Involvement: No (comment)  Admission diagnosis:  Vaginal bleeding [N93.9] UTI (urinary tract  infection) [N39.0] Complicated UTI (urinary tract infection) [N39.0] Patient Active Problem List   Diagnosis Date Noted   UTI (urinary tract infection) 09/05/2023   Suprapubic catheter (HCC) 12/05/2021   Generalized anxiety disorder 01/24/2021   Suspected deep tissue injury 10/25/2020   Wheelchair dependence 11/27/2019   Spasticity 11/27/2019   Hyperkalemia    Steroid-induced hyperglycemia    Prediabetes    Neurogenic bowel    Neurogenic bladder    Neuropathic pain    Quadriplegia (HCC) 08/31/2019   S/P cervical spinal fusion 08/28/2019   Central cord syndrome at C3 level of cervical spinal cord (HCC) 08/26/2019   Radiculopathy 10/21/2016   PCP:  Sari Cunning, MD Pharmacy:   [facility] [address] [place] [address] Phone: [phone] Fax: [phone]     Social Drivers of Health (SDOH) Social History: SDOH Screenings   Food Insecurity: No Food Insecurity (09/05/2023)  Housing: Low Risk  (09/05/2023)  Transportation Needs: No Transportation Needs (09/05/2023)  Recent Concern: Transportation Needs - Unmet Transportation Needs (08/23/2023)   Received from [facility]  Utilities: Not At Risk (09/05/2023)  Depression (PHQ2-9): Low Risk  (07/27/2023)  Financial Resource Strain: Low Risk  (08/23/2023)   Received from [facility]  Social Connections: Unknown (09/05/2023)  Tobacco Use: Low Risk  (09/05/2023)   SDOH Interventions:     Readmission Risk Interventions     No data to display

## 2023-09-06 NOTE — Consult Note (Addendum)
 WOC Nurse Consult Note: Reason for Consult: pressure injuries present on admission  Wound type: 1. Stage 3 Pressure Injury buttocks/coccyx  2.  Deep Tissue Pressure Injury R trochanter (hip)  Pressure Injury POA: Yes Measurement: see nursing flowsheet  Wound bed: buttocks/coccyx 50% red 50% tan; trochanters with red maroon discoloration skin intact  Drainage (amount, consistency, odor) see nursing flowsheet  Periwound: dark peeling skin to buttocks  Dressing procedure/placement/frequency:  Cleanse buttocks/coccyx (intact skin and wounds) with Vashe wound cleanser Timm Foot [id number]) do not rinse and allow to air dry.  Apply Medihoney to wound beds daily, cover with dry gauze and secure with silicone foam.   Apply silicone foam to R trochanter (area of DTPI).  Lift silicone foam to assess daily.   POC discussed with bedside nurse.  DTPI are high risk for deterioration and WOC should be consulted if develops any necrotic tissue.  Patient should be turned and repositioned q2 hours to offload pressure on these areas.    WOC team will not follow. Re-consult if further needs arise.   Thank you,    Ronni Colace MSN, RN-BC, [employer] [phone]

## 2023-09-06 NOTE — Progress Notes (Signed)
 [facility]  - [place] [facility]   PATIENT NAME: Meredith Roach    MR#:  [id number]  DATE OF BIRTH:  1941/01/16  SUBJECTIVE:  daughter and husband at bedside. Came in with Reginald spotting and some confusion possible UTI. No fever. Patient alert and oriented. Feels something in her mouth. Nausea vomiting.    VITALS:  Blood pressure (!) 102/41, pulse 84, temperature (!) 97.5 F (36.4 C), resp. rate 18, height 5\' 2"  (1.575 m), weight 71.4 kg, SpO2 96%.  PHYSICAL EXAMINATION:   GENERAL:  83 y.o.-year-old patient with no acute distress.  LUNGS: Normal breath sounds bilaterally, no wheezing CARDIOVASCULAR: S1, S2 normal. No murmur   ABDOMEN: Soft, nontender, nondistended. Suprapubic catheter present EXTREMITIES: No  edema b/l.    NEUROLOGIC: functional quadriplegia patient is alert and awake SKIN:  Pressure Injury 09/05/23 Buttocks Medial (Active)  09/05/23 1817  Location: Buttocks  Location Orientation: Medial  Staging:   Wound Description (Comments):   Present on Admission: Yes     Pressure Injury 09/05/23 Hip Left Deep Tissue Pressure Injury - Purple or maroon localized area of discolored intact skin or blood-filled blister due to damage of underlying soft tissue from pressure and/or shear. (Active)  09/05/23 1818  Location: Hip  Location Orientation: Left  Staging: Deep Tissue Pressure Injury - Purple or maroon localized area of discolored intact skin or blood-filled blister due to damage of underlying soft tissue from pressure and/or shear.  Wound Description (Comments):   Present on Admission: Yes     Pressure Injury 09/05/23 Hip Right Deep Tissue Pressure Injury - Purple or maroon localized area of discolored intact skin or blood-filled blister due to damage of underlying soft tissue from pressure and/or shear. (Active)  09/05/23 1839  Location: Hip  Location Orientation: Right  Staging: Deep Tissue Pressure Injury - Purple or maroon localized area  of discolored intact skin or blood-filled blister due to damage of underlying soft tissue from pressure and/or shear.  Wound Description (Comments):   Present on Admission: Yes      LABORATORY PANEL:  CBC Recent Labs  Lab 09/06/23 0438  WBC 5.7  HGB 12.6  HCT 38.1  PLT 177    Chemistries  Recent Labs  Lab 09/06/23 0438  NA 129*  K 3.2*  CL 85*  CO2 33*  GLUCOSE 81  BUN 11  CREATININE 0.71  CALCIUM 8.9   Cardiac Enzymes No results for input(s): "TROPONINI" in the last 168 hours. RADIOLOGY:  US  PELVIS (TRANSABDOMINAL ONLY) Result Date: 09/05/2023 CLINICAL DATA:  Vaginal bleeding EXAM: TRANSABDOMINAL ULTRASOUND OF PELVIS TECHNIQUE: Transabdominal ultrasound examination of the pelvis was performed including evaluation of the uterus, ovaries, adnexal regions, and pelvic cul-de-sac. COMPARISON:  CT 03/28/2020 and previous FINDINGS: Uterus Measurements: 6.2 x 2.9 x 4.6 cm = volume: 43 mL. Anteverted. No fibroids or other mass visualized. Endometrium Thickness: 8 mm.  No focal abnormality visualized. Right ovary Not identified Left ovary Not visualized No free fluid.  Suprapubic bladder catheter incidentally noted. IMPRESSION: 1. Negative for uterine mass 2. Endometrial thickening. 3. Nonvisualized ovaries. Electronically Signed   By: Nicoletta Barrier M.D.   On: 09/05/2023 15:36    Assessment and Plan ARELY TINNER is a 83 y.o. female with medical history significant for incomplete quadriplegia, wheelchair dependent with chronic suprapubic catheter being admitted to the hospital with concern for UTI in the setting of weakness and vaginal bleeding.    Acute cystitis/UTI suspected due to indwelling suprapubic catheter Neurogenic Bladder --  suprapubic catheter was changed on 19th of May by urology PA - concerning due to weakness and mild confusion, which are consistent with her prior UTI. -Empiric IV Cipro  -09/05/23 urine culture-- Enterococcus. Awaiting sensitivities  Vaginal  bleeding-not severe, not related to UTI.  No obvious uterine mass on transabdominal pelvic ultrasound--showed endometrial thickening. - Consult GYN  Dr Ardyth Kroner it difficult for family to care for her at home, may be related to UTI which is being treated as above.    Diastolic CHF-continue metolazone and torsemide    Hyponatremia Hypokalemia - KCl   Hypothyroidism- Synthroid    Functional/Spastic quadriplegia - continue Neurontin , Cymbalta , Paxil , baclofen    GERD-omeprazole   Procedures: Family communication :husband and dter Consults :GYN CODE STATUS: FULL DVT Prophylaxis :Lovenox  Level of care: Med-Surg Status is: Observation The patient remains OBS appropriate and will d/c before 2 midnights.    TOTAL TIME TAKING CARE OF THIS PATIENT: 45 minutes.  >50% time spent on counselling and coordination of care  Note: This dictation was prepared with Dragon dictation along with smaller phrase technology. Any transcriptional errors that result from this process are unintentional.  Melvinia Stager M.D    [facility]   CC: Primary care physician; Sari Cunning, MD

## 2023-09-06 NOTE — Consult Note (Signed)
 Reason for Consult:PMB   Meredith Roach is an married 83 y.o. woman admitted for a UTI. She reports a week of light postmenopausal bleeding. She is not taking HRT or blood thinners. This is the first bleeding that she has seen since menopause around age 62.  Of note, she has partial paraplegia.     Past Medical History:  Diagnosis Date   Arthritis    GERD (gastroesophageal reflux disease)    Gout    Neuromuscular disorder (HCC)    Pre-diabetes     Past Surgical History:  Procedure Laterality Date   ANTERIOR CERVICAL DECOMPRESSION/DISCECTOMY FUSION 4 LEVELS N/A 08/28/2019   Procedure: ANTERIOR CERVICAL DECOMPRESSION/DISCECTOMY FUSION CERVICALTHREE-FOUR CERVICAL,FOUR-FIVE,CERVICAL FIVE-SIX,CERVICAL SIX-SEVEN.;  Surgeon: Isadora Mar, MD;  Location: [facility] OR;  Service: Neurosurgery;  Laterality: N/A;  ANTERIOR   BACK SURGERY     CATARACT EXTRACTION W/PHACO Right 03/31/2018   Procedure: CATARACT EXTRACTION PHACO AND INTRAOCULAR LENS PLACEMENT (IOC);  Surgeon: Ola Berger, MD;  Location: [facility] ORS;  Service: Ophthalmology;  Laterality: Right;  US  00:39.7 CDE 4.35 Fluid Pack Lot # [id number]   CHOLECYSTECTOMY  1995   COLONOSCOPY WITH PROPOFOL  N/A 06/07/2017   Procedure: COLONOSCOPY WITH PROPOFOL ;  Surgeon: Cassie Click, MD;  Location: [facility] ENDOSCOPY;  Service: Endoscopy;  Laterality: N/A;   ESOPHAGOGASTRODUODENOSCOPY (EGD) WITH PROPOFOL  N/A 06/07/2017   Procedure: ESOPHAGOGASTRODUODENOSCOPY (EGD) WITH PROPOFOL ;  Surgeon: Cassie Click, MD;  Location: [facility] ENDOSCOPY;  Service: Endoscopy;  Laterality: N/A;   IR CATHETER TUBE CHANGE  05/10/2020   REDUCTION MAMMAPLASTY Bilateral 1994    Family History  Problem Relation Age of Onset   Breast cancer Maternal Aunt    Breast cancer Paternal Aunt    CAD Father    Heart attack Father     Social History:  reports that she has never smoked. She has never been exposed to tobacco smoke. She has never used smokeless tobacco. She reports  that she does not drink alcohol and does not use drugs.  Allergies:  Allergies  Allergen Reactions   Ace Inhibitors Anaphylaxis    Medications: Prior to Admission:  Medications Prior to Admission  Medication Sig Dispense Refill Last Dose/Taking   baclofen  (LIORESAL ) 20 MG tablet Take 1 tablet (20 mg total) by mouth 4 (four) times daily. 360 tablet 0 09/05/2023   ciprofloxacin  (CIPRO ) 500 MG tablet Take 500 mg by mouth 2 (two) times daily.   09/05/2023 Morning   cyproheptadine  (PERIACTIN ) 4 MG tablet Take 1 tablet (4 mg total) by mouth 3 (three) times daily as needed (baclofen  withdrawal). 270 tablet 3 Taking As Needed   dantrolene  (DANTRIUM ) 100 MG capsule Take 1 capsule (100 mg total) by mouth 3 (three) times daily. With Baclofen - Zanaflex  causes sedation 90 capsule 5 09/05/2023   DULoxetine  (CYMBALTA ) 60 MG capsule Take 1 capsule (60 mg total) by mouth at bedtime. 30 capsule 5 09/04/2023 Evening   gabapentin  (NEURONTIN ) 600 MG tablet Take 2 tablets (1,200 mg total) by mouth 3 (three) times daily. 180 tablet 5 09/05/2023   levothyroxine  (SYNTHROID ) 100 MCG tablet Take 1 tablet (100 mcg total) by mouth daily. Take on an empty stomach with a glass of water at least 30-60 minutes before breakfast. 90 tablet 3 09/05/2023 Morning   lidocaine  (XYLOCAINE ) 5 % ointment Apply 1 application. topically as needed. To LLE- no more than foot/ankle/front of leg to knee at a time- up to 4x/day 50 g 5 Taking As Needed   metolazone (ZAROXOLYN) 2.5 MG tablet Take 1.25  mg by mouth 2 (two) times a week.   Past Week   omeprazole  (PRILOSEC) 40 MG capsule Take 1 capsule (40 mg total) by mouth daily. 90 capsule 3 09/04/2023 Evening   PARoxetine  (PAXIL ) 20 MG tablet Take 1 tablet (20 mg total) by mouth daily. 90 tablet 5 09/05/2023 Morning   potassium chloride  (KLOR-CON ) 8 MEQ tablet Take 8 mEq by mouth daily.   09/05/2023   torsemide  (DEMADEX ) 10 MG tablet Take 1 tablet (10 mg total) by mouth once daily 90 tablet 3 09/05/2023    traMADol  (ULTRAM ) 50 MG tablet Take 1 tablet (50 mg total) by mouth every 6 (six) hours as needed. 120 tablet 5 09/05/2023 Morning    Review of Systems  Blood pressure 132/60, pulse 77, temperature 97.9 F (36.6 C), resp. rate 16, height 5\' 2"  (1.575 m), weight 71.4 kg, SpO2 95%. Physical Exam Well nourished, well hydrated White female, no apparent distress She is conversing normally. I discussed PMB and her 8 mm endometrium. I rec'd a EMBX, discussed risks and benefits. She opted to pursue this.  She was placed atop 2 pillows (under her buttocks) and I placed a Grave's speculum. I visualized the cervix which had bloody mucous but no polyps or other abnormalities were noted. I prepped the cervix with betadine  and grasped the anterior lip of the cervix with a single tooth tenaculum. The Pipelle was not able to pass through her stenotic cervix, even with significant force.   Results for orders placed or performed during the hospital encounter of 09/05/23 (from the past 48 hours)  Urine Culture     Status: Abnormal (Preliminary result)   Collection Time: 09/05/23 11:30 AM   Specimen: Urine, Suprapubic  Result Value Ref Range   Specimen Description      URINE, SUPRAPUBIC Performed at [facility], [address], [place], [place] [address]    Special Requests      NONE Performed at [facility], [address], [place], [place] [address]    Culture (A)     30,000 COLONIES/mL ENTEROCOCCUS FAECALIS SUSCEPTIBILITIES TO FOLLOW Performed at [facility], [address], [place], [place] [address]    Report Status PENDING   Urinalysis, Routine w reflex microscopic -Urine, Catheterized     Status: Abnormal   Collection Time: 09/05/23 11:45 AM  Result Value Ref Range   Color, Urine YELLOW (A) YELLOW   APPearance CLOUDY (A) CLEAR   Specific Gravity, Urine 1.011 1.005 - 1.030   pH 6.0 5.0 - 8.0   Glucose, UA NEGATIVE NEGATIVE mg/dL   Hgb urine dipstick NEGATIVE  NEGATIVE   Bilirubin Urine NEGATIVE NEGATIVE   Ketones, ur 5 (A) NEGATIVE mg/dL   Protein, ur NEGATIVE NEGATIVE mg/dL   Nitrite NEGATIVE NEGATIVE   Leukocytes,Ua LARGE (A) NEGATIVE   RBC / HPF 11-20 0 - 5 RBC/hpf   WBC, UA >50 0 - 5 WBC/hpf   Bacteria, UA MANY (A) NONE SEEN   Squamous Epithelial / HPF 0-5 0 - 5 /HPF   WBC Clumps PRESENT    Mucus PRESENT    Hyaline Casts, UA PRESENT    Cellular Cast, UA PRESENT     Comment: Performed at [facility], [address] [place], [place] [address]  Hemoglobin A1c     Status: None   Collection Time: 09/05/23 11:55 AM  Result Value Ref Range   Hgb A1c MFr Bld 4.9 4.8 - 5.6 %    Comment: (NOTE) Pre diabetes:  5.7%-6.4%  Diabetes:              >6.4%  Glycemic control for   <7.0% adults with diabetes    Mean Plasma Glucose 93.93 mg/dL    Comment: Performed at [facility], [address] [place], [place] [address]  CBC with Differential     Status: None   Collection Time: 09/05/23 12:32 PM  Result Value Ref Range   WBC 5.9 4.0 - 10.5 K/uL   RBC 4.95 3.87 - 5.11 MIL/uL   Hemoglobin 13.6 12.0 - 15.0 g/dL   HCT 47.8 29.5 - 62.1 %   MCV 85.5 80.0 - 100.0 fL   MCH 27.5 26.0 - 34.0 pg   MCHC 32.2 30.0 - 36.0 g/dL   RDW 30.8 65.7 - 84.6 %   Platelets 159 150 - 400 K/uL   nRBC 0.0 0.0 - 0.2 %   Neutrophils Relative % 78 %   Neutro Abs 4.6 1.7 - 7.7 K/uL   Lymphocytes Relative 14 %   Lymphs Abs 0.8 0.7 - 4.0 K/uL   Monocytes Relative 6 %   Monocytes Absolute 0.3 0.1 - 1.0 K/uL   Eosinophils Relative 1 %   Eosinophils Absolute 0.1 0.0 - 0.5 K/uL   Basophils Relative 0 %   Basophils Absolute 0.0 0.0 - 0.1 K/uL   Immature Granulocytes 1 %   Abs Immature Granulocytes 0.03 0.00 - 0.07 K/uL    Comment: Performed at [facility], [address] [place], [place] [address]  Basic metabolic panel     Status: Abnormal   Collection Time: 09/05/23 12:32 PM  Result Value Ref Range   Sodium 131 (L) 135 - 145  mmol/L   Potassium 3.8 3.5 - 5.1 mmol/L   Chloride 87 (L) 98 - 111 mmol/L   CO2 32 22 - 32 mmol/L   Glucose, Bld 107 (H) 70 - 99 mg/dL    Comment: Glucose reference range applies only to samples taken after fasting for at least 8 hours.   BUN 10 8 - 23 mg/dL   Creatinine, Ser 2.84 0.44 - 1.00 mg/dL   Calcium 9.3 8.9 - 13.2 mg/dL   GFR, Estimated >44 >01 mL/min    Comment: (NOTE) Calculated using the CKD-EPI Creatinine Equation (2021)    Anion gap 12 5 - 15    Comment: Performed at [facility], [address], [place], [place] [address]  Troponin I (High Sensitivity)     Status: None   Collection Time: 09/05/23 12:32 PM  Result Value Ref Range   Troponin I (High Sensitivity) 6 <18 ng/L    Comment: (NOTE) Elevated high sensitivity troponin I (hsTnI) values and significant  changes across serial measurements may suggest ACS but many other  chronic and acute conditions are known to elevate hsTnI results.  Refer to the "Links" section for chest pain algorithms and additional  guidance. Performed at [facility], [address] [place], [place] [address]   Glucose, capillary     Status: Abnormal   Collection Time: 09/05/23  8:58 PM  Result Value Ref Range   Glucose-Capillary 164 (H) 70 - 99 mg/dL    Comment: Glucose reference range applies only to samples taken after fasting for at least 8 hours.  Basic metabolic panel     Status: Abnormal   Collection Time: 09/06/23  4:38 AM  Result Value Ref Range   Sodium 129 (L) 135 - 145 mmol/L   Potassium 3.2 (L) 3.5 -  5.1 mmol/L   Chloride 85 (L) 98 - 111 mmol/L   CO2 33 (H) 22 - 32 mmol/L   Glucose, Bld 81 70 - 99 mg/dL    Comment: Glucose reference range applies only to samples taken after fasting for at least 8 hours.   BUN 11 8 - 23 mg/dL   Creatinine, Ser 8.65 0.44 - 1.00 mg/dL   Calcium 8.9 8.9 - 78.4 mg/dL   GFR, Estimated >69 >62 mL/min    Comment: (NOTE) Calculated using the CKD-EPI Creatinine Equation  (2021)    Anion gap 11 5 - 15    Comment: Performed at [facility], [address] [place], [place] [address]  CBC     Status: None   Collection Time: 09/06/23  4:38 AM  Result Value Ref Range   WBC 5.7 4.0 - 10.5 K/uL   RBC 4.55 3.87 - 5.11 MIL/uL   Hemoglobin 12.6 12.0 - 15.0 g/dL   HCT 13.2 44.0 - 10.2 %   MCV 83.7 80.0 - 100.0 fL   MCH 27.7 26.0 - 34.0 pg   MCHC 33.1 30.0 - 36.0 g/dL   RDW 72.5 36.6 - 44.0 %   Platelets 177 150 - 400 K/uL   nRBC 0.0 0.0 - 0.2 %    Comment: Performed at [facility], [address] [place], [place] [address]  Glucose, capillary     Status: Abnormal   Collection Time: 09/06/23  7:56 AM  Result Value Ref Range   Glucose-Capillary 106 (H) 70 - 99 mg/dL    Comment: Glucose reference range applies only to samples taken after fasting for at least 8 hours.  Glucose, capillary     Status: None   Collection Time: 09/06/23 11:40 AM  Result Value Ref Range   Glucose-Capillary 94 70 - 99 mg/dL    Comment: Glucose reference range applies only to samples taken after fasting for at least 8 hours.  Glucose, capillary     Status: Abnormal   Collection Time: 09/06/23  4:32 PM  Result Value Ref Range   Glucose-Capillary 103 (H) 70 - 99 mg/dL    Comment: Glucose reference range applies only to samples taken after fasting for at least 8 hours.    US  PELVIS (TRANSABDOMINAL ONLY) Result Date: 09/05/2023 CLINICAL DATA:  Vaginal bleeding EXAM: TRANSABDOMINAL ULTRASOUND OF PELVIS TECHNIQUE: Transabdominal ultrasound examination of the pelvis was performed including evaluation of the uterus, ovaries, adnexal regions, and pelvic cul-de-sac. COMPARISON:  CT 03/28/2020 and previous FINDINGS: Uterus Measurements: 6.2 x 2.9 x 4.6 cm = volume: 43 mL. Anteverted. No fibroids or other mass visualized. Endometrium Thickness: 8 mm.  No focal abnormality visualized. Right ovary Not identified Left ovary Not visualized No free fluid.  Suprapubic bladder  catheter incidentally noted. IMPRESSION: 1. Negative for uterine mass 2. Endometrial thickening. 3. Nonvisualized ovaries. Electronically Signed   By: Nicoletta Barrier M.D.   On: 09/05/2023 15:36    Assessment/Plan: PMB, 8 mm endometrium Stenotic cervix and no ability to do the [facility] in the hospital bed. I offered her pre treatment with cytotec and EMBX attempt in the office versus scheduling a d&c versus no further evaluation. She and her family with consider the options and call the office if they decide to pursue the biopsy.   Ana Balling 09/06/2023

## 2023-09-06 NOTE — Evaluation (Signed)
 Clinical/Bedside Swallow Evaluation Patient Details  Name: BOBBI YOUNT MRN: [id number] Date of Birth: Aug 12, 1940  Today's Date: 09/06/2023 Time: SLP Start Time (ACUTE ONLY): 1245 SLP Stop Time (ACUTE ONLY): 1345 SLP Time Calculation (min) (ACUTE ONLY): 60 min  Past Medical History:  Past Medical History:  Diagnosis Date   Arthritis    GERD (gastroesophageal reflux disease)    Gout    Neuromuscular disorder (HCC)    Pre-diabetes    Past Surgical History:  Past Surgical History:  Procedure Laterality Date   ANTERIOR CERVICAL DECOMPRESSION/DISCECTOMY FUSION 4 LEVELS N/A 08/28/2019   Procedure: ANTERIOR CERVICAL DECOMPRESSION/DISCECTOMY FUSION CERVICALTHREE-FOUR CERVICAL,FOUR-FIVE,CERVICAL FIVE-SIX,CERVICAL SIX-SEVEN.;  Surgeon: Isadora Mar, MD;  Location: [facility] OR;  Service: Neurosurgery;  Laterality: N/A;  ANTERIOR   BACK SURGERY     CATARACT EXTRACTION W/PHACO Right 03/31/2018   Procedure: CATARACT EXTRACTION PHACO AND INTRAOCULAR LENS PLACEMENT (IOC);  Surgeon: Ola Berger, MD;  Location: [facility] ORS;  Service: Ophthalmology;  Laterality: Right;  US  00:39.7 CDE 4.35 Fluid Pack Lot # [id number]   CHOLECYSTECTOMY  1995   COLONOSCOPY WITH PROPOFOL  N/A 06/07/2017   Procedure: COLONOSCOPY WITH PROPOFOL ;  Surgeon: Cassie Click, MD;  Location: [facility] ENDOSCOPY;  Service: Endoscopy;  Laterality: N/A;   ESOPHAGOGASTRODUODENOSCOPY (EGD) WITH PROPOFOL  N/A 06/07/2017   Procedure: ESOPHAGOGASTRODUODENOSCOPY (EGD) WITH PROPOFOL ;  Surgeon: Cassie Click, MD;  Location: [facility] ENDOSCOPY;  Service: Endoscopy;  Laterality: N/A;   IR CATHETER TUBE CHANGE  05/10/2020   REDUCTION MAMMAPLASTY Bilateral 1994   HPI:  Pt is a 83 y.o. female with medical history significant for GERD on 20mg  PPI at home, Gastritis in 2019, incomplete quadriplegia, wheelchair dependent with chronic suprapubic catheter, neuromuscular dis., Gout, arthritis, unable to attain full ROM of UEs hampering self-feeding, who is  being admitted to the hospital with concern for UTI in the setting of weakness and vaginal bleeding.  History is mainly provided by the patient's daughter who is at the bedside and very closely involved in her care.  Remarkably, it seems the patient has only had 1 significant UTI since her injury in 2021.  She has had a suprapubic catheter for about 3 years.  It is changed on a regular basis, was last done about 1 week ago.  For the last week or so, family has noted that the patient has been more weak than usual.  Typically, they are able to get her in a lift and move her around with some assistance from her, but in the last few days she has not been able to assist.  Daughter and pt also state that pt c/o a Globus feedling in sternal notch area w/ "meats and breads especially"; pt has GERD dx'd. She does not follow w/ GI at home.  Also, in 08/26/2019, pt had a ground-level fall with resultant quadriparesis.  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  CT/MRI of the brain no acute intracranial abnormality.  Patient underwent decompressive anterior cervical discectomy C3-4, 4-5, C5-6, 6-7 with anterior cervical arthrodesis 08/28/2019.  Post surgery, pt was seen by ST services for swallowing w/ min c/o Globus then.    Last Head CT and CXR in 2024: No active disease or changes noted. No current imaging this admit. WOC is following for wounds.     Assessment / Plan / Recommendation  Clinical Impression   Pt seen for BSE today. Pt awake, verbal and followed all instructions. Pleasant and smiled  often in conversation. Noted min slower speech but no changes from her Baseline reported. No immediate c/o discomfort when swallowing po's/liquids/pills but endorsed "some" difficulty w/ "meats/breads" at home; noted this was a similar complaint s/p surgery in 2021 d/t "severe spondylosis and severe spinal stenosis with cord compression  from C3-C7". Noted reduced ROM of UEs impacting her self-feeding. NSG present address IV.  On RA, afebrile. WBC wnl.  Pt appears to present w/ functional oropharyngeal phase swallowing w/ No oropharyngeal phase dysphagia noted, No sensorimotor deficits noted. Pt consumed po trials w/ No overt, clinical s/s of aspiration during the po trials.  Pt appears at reduced risk for aspiration when following general aspiration precautions as well as GERD precautions to lessen risk for Esophageal dysmotility and Regurgitation events. However, pt does have challenging factors that could impact her oropharyngeal swallowing to include deconditioning/weakness, GERD, decompressive anterior cervical discectomy C3-4, 4-5, C5-6, 6-7 with anterior cervical arthrodesis 08/28/2019 and reduced UEs ROM hampering her self-feeding abilities. These factors can increase risk for dysphagia as well as decreased oral intake overall.   During po trials, pt consumed all consistencies presented w/ no overt coughing, decline in vocal quality, or change in respiratory presentation during/post trials. Oral phase appeared [facility] w/ timely bolus management, mastication, and control of bolus propulsion for A-P transfer for swallowing. Oral clearing achieved w/ all trial consistencies -- moistened, soft foods given.  OM Exam appeared [facility] w/ no unilateral weakness noted. Min white coating on tongue(MD aware). Speech Clear. Pt able to hold Cup to feed self liquid; support given for foods.   Recommend a fairly Regular consistency diet w/ well-Cut meats, moistened foods; Thin liquids. AVOID PROBLEMATIC FOODS such as dry particulates and dense, dry meats and breads as these consistencies can slower clearing the Esophagus. Recommend f/u w/ GI post admit for assessment of Esophageal Motility and PPI monitoring for GERD.  Pt should help to Hold Cup when drinking. Recommend general aspiration precautions including Small bites/sips Slowly; sitting Fully  Upright for all oral intake. Reduce distractions/talking during po intake. Feeding support at meals. Pills WHOLE in Puree for safer, easier swallowing -- pt does this at home. It is encouraged now and for D/C home.   Education given on Pills in Puree to continue; food consistencies and easy to eat/prep options vs more challenging consistencies; general aspiration precautions; Reflux precautions to pt and Dtr. No further Acute ST services indicated at this time. MD/NSG to reconsult if any new needs arise during admit. Pt and Dtr had no further questions. NSG updated, agreed. MD updated. Recommend Dietician f/u for support. 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 AVOID PROBLEMATIC FOODS such as dry particulates and dense, dry meats and breads as these consistencies can slower clearing the Esophagus. Recommend f/u w/ GI post admit for assessment of Esophageal Motility and PPI monitoring for GERD.  Pt should help to Hold Cup when drinking. Recommend general aspiration precautions including Small bites/sips Slowly; sitting Fully Upright for all oral intake. Reduce distractions/talking during po intake. Feeding support at meals.  Medication Administration: Whole meds with puree (baseline)    Other  Recommendations Recommended Consults:  (  Dietician f/u for support) Oral Care Recommendations: Oral care BID;Staff/trained caregiver to provide oral care (support)    Recommendations for follow up therapy are one component of a multi-disciplinary discharge planning process, led by the  attending physician.  Recommendations may be updated based on patient status, additional functional criteria and insurance authorization.  Follow up Recommendations No SLP follow up      Assistance Recommended at Discharge  FULL at meals to support  Functional Status Assessment Patient has had a recent decline in their functional status and demonstrates the ability to make significant improvements in function in a reasonable and predictable amount of time.  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 female with medical history significant for GERD on 20mg  PPI at home, Gastritis in 2019, incomplete quadriplegia, wheelchair dependent with chronic suprapubic catheter, neuromuscular dis., Gout, arthritis, unable to attain full ROM of UEs hampering self-feeding, who is being admitted to the hospital with concern for UTI in the setting of weakness and vaginal bleeding.  History is mainly provided by the patient's daughter who is at the bedside and very closely involved in her care.  Remarkably, it seems the patient has only had 1 significant UTI since her injury in 2021.  She has had a suprapubic catheter for about 3 years.  It is changed on a regular basis, was last done about 1 week ago.  For the last week or so, family has noted that the patient has been more weak than usual.  Typically, they are able to get her in a lift and move her around with some assistance from her, but in the last few days she has not been able to assist.  Daughter and pt also state that pt c/o a Globus feedling in sternal notch area w/ "meats and breads especially"; pt has GERD dx'd. She  does not follow w/ GI at home.  Also, in 08/26/2019, pt had a ground-level fall with resultant quadriparesis.  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  CT/MRI of the brain no acute intracranial abnormality.  Patient underwent decompressive anterior cervical discectomy C3-4, 4-5, C5-6, 6-7 with anterior cervical arthrodesis 08/28/2019.  Post surgery, pt was seen by ST services for swallowing w/ min c/o Globus then.   Last Head CT and CXR in 2024: No active disease or changes noted.  WOC is following for wounds. Type of Study: Bedside Swallow Evaluation Previous Swallow Assessment: 2021 - regular diet, thins Diet Prior to this Study: Regular;Thin liquids (Level 0) Temperature Spikes Noted: No (wbc 5.7) Respiratory Status: Room air History of Recent Intubation: No Behavior/Cognition: Alert;Cooperative;Pleasant mood Oral Cavity Assessment:  (min white coating on tongue surface (thin) -- MD is aware per Dtr.) Oral Care Completed by SLP: Yes Oral Cavity - Dentition: Adequate natural dentition Vision: Functional for self-feeding Self-Feeding Abilities: Able to feed self;Needs assist;Needs set up (MOD+ assist d/t UEs ROM) Patient Positioning: Upright in bed (FULL support for more supported upright sitting) Baseline Vocal Quality: Normal Volitional Cough: Strong Volitional Swallow: Able to elicit    Oral/Motor/Sensory Function Overall Oral Motor/Sensory  Function: Within functional limits   Ice Chips Ice chips: Not tested   Thin Liquid Thin Liquid: Within functional limits Presentation: Self Fed;Straw (~4+ ozs)    Nectar Thick Nectar Thick Liquid: Not tested   Honey Thick Honey Thick Liquid: Not tested   Puree Puree: Within functional limits Presentation: Spoon (fed; 12+ trials) Other Comments: w/ pils too   Solid     Solid: Within functional limits Presentation: Self Fed (5 trials) Other Comments:  moistened well for ease of clearing        Darla Edward, MS, CCC-SLP Speech Language Pathologist Rehab Services; [facility] [phone] (ascom) Levester Waldridge 09/06/2023,2:27 PM

## 2023-09-06 NOTE — Progress Notes (Signed)
 Patient is complain that she feels something is in her mouth. On assessment her tongue has a coating present. Patient is able to swallow has no respiratory difficulty. Will handoff to oncoming nurse to follow up with the provider during rounds. Daughter at bedside who states patient has been a little off stating she was seeing things periodically during the night. Will also communicate this to oncoming nurse.

## 2023-09-06 NOTE — Care Management Obs Status (Signed)
 MEDICARE OBSERVATION STATUS NOTIFICATION   Patient Details  Name: Meredith Roach MRN: [id number] Date of Birth: 30-Jun-1940   Medicare Observation Status Notification Given:  Yes    Tykisha Areola W, CMA 09/06/2023, 11:02 AM

## 2023-09-06 NOTE — Plan of Care (Signed)
  Problem: Education: Goal: Ability to describe self-care measures that may prevent or decrease complications (Diabetes Survival Skills Education) will improve Outcome: Progressing Goal: Individualized Educational Video(s) Outcome: Progressing   Problem: Coping: Goal: Ability to adjust to condition or change in health will improve Outcome: Progressing   Problem: Health Behavior/Discharge Planning: Goal: Ability to identify and utilize available resources and services will improve Outcome: Progressing Goal: Ability to manage health-related needs will improve Outcome: Progressing   Problem: Skin Integrity: Goal: Risk for impaired skin integrity will decrease Outcome: Progressing   Problem: Education: Goal: Knowledge of General Education information will improve Description: Including pain rating scale, medication(s)/side effects and non-pharmacologic comfort measures Outcome: Progressing   Problem: Elimination: Goal: Will not experience complications related to bowel motility Outcome: Progressing Goal: Will not experience complications related to urinary retention Outcome: Progressing

## 2023-09-07 ENCOUNTER — Other Ambulatory Visit: Payer: Self-pay | Admitting: Obstetrics & Gynecology

## 2023-09-07 ENCOUNTER — Other Ambulatory Visit: Payer: Self-pay

## 2023-09-07 DIAGNOSIS — N3000 Acute cystitis without hematuria: Secondary | ICD-10-CM | POA: Diagnosis not present

## 2023-09-07 DIAGNOSIS — G8250 Quadriplegia, unspecified: Secondary | ICD-10-CM | POA: Diagnosis not present

## 2023-09-07 DIAGNOSIS — N939 Abnormal uterine and vaginal bleeding, unspecified: Secondary | ICD-10-CM | POA: Diagnosis not present

## 2023-09-07 DIAGNOSIS — Z9359 Other cystostomy status: Secondary | ICD-10-CM | POA: Diagnosis not present

## 2023-09-07 LAB — URINE CULTURE: Culture: 30000 — AB

## 2023-09-07 LAB — BASIC METABOLIC PANEL WITH GFR
Anion gap: 11 (ref 5–15)
BUN: 13 mg/dL (ref 8–23)
CO2: 32 mmol/L (ref 22–32)
Calcium: 9.1 mg/dL (ref 8.9–10.3)
Chloride: 82 mmol/L — ABNORMAL LOW (ref 98–111)
Creatinine, Ser: 0.59 mg/dL (ref 0.44–1.00)
GFR, Estimated: >60 mL/min (ref >60)
Glucose, Bld: 100 mg/dL — ABNORMAL HIGH (ref 70–99)
Potassium: 3 mmol/L — ABNORMAL LOW (ref 3.5–5.1)
Sodium: 125 mmol/L — ABNORMAL LOW (ref 135–145)

## 2023-09-07 LAB — GLUCOSE, CAPILLARY
Glucose-Capillary: 111 mg/dL — ABNORMAL HIGH (ref 70–99)
Glucose-Capillary: 100 mg/dL — ABNORMAL HIGH (ref 70–99)
Glucose-Capillary: 99 mg/dL (ref 70–99)
Glucose-Capillary: 107 mg/dL — ABNORMAL HIGH (ref 70–99)

## 2023-09-07 MED ORDER — LORAZEPAM 2 MG/ML IJ SOLN
2.0000 mg | Freq: Once | INTRAMUSCULAR | Status: AC
  Administered 2023-09-07: 2 mg via INTRAVENOUS
  Filled 2023-09-07: qty 1

## 2023-09-07 MED ORDER — TRAZODONE HCL 50 MG PO TABS
50.0000 mg | ORAL_TABLET | Freq: Every day | ORAL | Status: DC
  Administered 2023-09-08 – 2023-09-09 (×3): 50 mg via ORAL
  Filled 2023-09-07 (×3): qty 1

## 2023-09-07 MED ORDER — POTASSIUM CHLORIDE IN NACL 20-0.9 MEQ/L-% IV SOLN
INTRAVENOUS | Status: AC
  Filled 2023-09-07 (×2): qty 1000

## 2023-09-07 MED ORDER — AMOXICILLIN 500 MG PO CAPS
500.0000 mg | ORAL_CAPSULE | Freq: Three times a day (TID) | ORAL | Status: DC
  Administered 2023-09-07 – 2023-09-09 (×7): 500 mg via ORAL
  Filled 2023-09-07 (×8): qty 1

## 2023-09-07 MED ORDER — MEGESTROL ACETATE 40 MG/ML PO SUSP
40.0000 mg | Freq: Two times a day (BID) | ORAL | 5 refills | Status: DC
Start: 2023-09-07 — End: 2023-11-18
  Filled 2023-09-07: qty 60, 30d supply, fill #0

## 2023-09-07 NOTE — Plan of Care (Signed)
  Problem: Education: Goal: Ability to describe self-care measures that may prevent or decrease complications (Diabetes Survival Skills Education) will improve Outcome: Progressing Goal: Individualized Educational Video(s) Outcome: Progressing   Problem: Coping: Goal: Ability to adjust to condition or change in health will improve Outcome: Progressing   Problem: Fluid Volume: Goal: Ability to maintain a balanced intake and output will improve Outcome: Progressing   Problem: Health Behavior/Discharge Planning: Goal: Ability to identify and utilize available resources and services will improve Outcome: Progressing Goal: Ability to manage health-related needs will improve Outcome: Progressing   Problem: Metabolic: Goal: Ability to maintain appropriate glucose levels will improve Outcome: Progressing   Problem: Nutritional: Goal: Maintenance of adequate nutrition will improve Outcome: Progressing Goal: Progress toward achieving an optimal weight will improve Outcome: Progressing   Problem: Skin Integrity: Goal: Risk for impaired skin integrity will decrease Outcome: Progressing   Problem: Education: Goal: Knowledge of General Education information will improve Description: Including pain rating scale, medication(s)/side effects and non-pharmacologic comfort measures Outcome: Progressing   Problem: Health Behavior/Discharge Planning: Goal: Ability to manage health-related needs will improve Outcome: Progressing   Problem: Clinical Measurements: Goal: Ability to maintain clinical measurements within normal limits will improve Outcome: Progressing Goal: Will remain free from infection Outcome: Progressing Goal: Diagnostic test results will improve Outcome: Progressing Goal: Respiratory complications will improve Outcome: Progressing Goal: Cardiovascular complication will be avoided Outcome: Progressing   Problem: Activity: Goal: Risk for activity intolerance will  decrease Outcome: Progressing   Problem: Nutrition: Goal: Adequate nutrition will be maintained Outcome: Progressing   Problem: Coping: Goal: Level of anxiety will decrease Outcome: Progressing   Problem: Pain Managment: Goal: General experience of comfort will improve and/or be controlled Outcome: Progressing   Problem: Safety: Goal: Ability to remain free from injury will improve Outcome: Progressing   Problem: Skin Integrity: Goal: Risk for impaired skin integrity will decrease Outcome: Progressing

## 2023-09-07 NOTE — Progress Notes (Signed)
 Patient demonstrates increased agitation and irritability around 1625 verbalizing "see those men crawling on me?  I'm going to fall over.  I'm spinning."  Dr Lydia Sams notified of patients statements with Ativan ordered.

## 2023-09-07 NOTE — Progress Notes (Addendum)
 [facility]  - [place] at [facility]   PATIENT NAME: Meredith Roach    MR#:  [id number]  DATE OF BIRTH:  Jul 17, 1940  SUBJECTIVE:  daughter and husband at bedside. Came in with vaginal spotting and some confusion possible UTI.  Patient appears somewhat confused. Did not sleep all last night per RN. She is very sleepy however does not want to rest. Seeing people in her room per daughter. Appears clinically dehydrated with oral mucosa dry. Poor PO intake.   VITALS:  Blood pressure 120/70, pulse 77, temperature 97.7 F (36.5 C), temperature source Oral, resp. rate 16, height 5\' 2"  (1.575 m), weight 71.4 kg, SpO2 98%.  PHYSICAL EXAMINATION:   GENERAL:  83 y.o.-year-old patient with no acute distress. Confused, oral mucosa dry LUNGS: Normal breath sounds bilaterally CARDIOVASCULAR: S1, S2 normal. No murmur   ABDOMEN: Soft, nontender, nondistended. Suprapubic catheter present EXTREMITIES: No  edema b/l.    NEUROLOGIC: functional quadriplegia patient is alert but some confusion today SKIN:  Pressure Injury 09/05/23 Buttocks Medial (Active)  09/05/23 1817  Location: Buttocks  Location Orientation: Medial  Staging:   Wound Description (Comments):   Present on Admission: Yes     Pressure Injury 09/05/23 Hip Left Deep Tissue Pressure Injury - Purple or maroon localized area of discolored intact skin or blood-filled blister due to damage of underlying soft tissue from pressure and/or shear. (Active)  09/05/23 1818  Location: Hip  Location Orientation: Left  Staging: Deep Tissue Pressure Injury - Purple or maroon localized area of discolored intact skin or blood-filled blister due to damage of underlying soft tissue from pressure and/or shear.  Wound Description (Comments):   Present on Admission: Yes     Pressure Injury 09/05/23 Hip Right Deep Tissue Pressure Injury - Purple or maroon localized area of discolored intact skin or blood-filled blister due to damage of  underlying soft tissue from pressure and/or shear. (Active)  09/05/23 1839  Location: Hip  Location Orientation: Right  Staging: Deep Tissue Pressure Injury - Purple or maroon localized area of discolored intact skin or blood-filled blister due to damage of underlying soft tissue from pressure and/or shear.  Wound Description (Comments):   Present on Admission: Yes      LABORATORY PANEL:  CBC Recent Labs  Lab 09/06/23 0438  WBC 5.7  HGB 12.6  HCT 38.1  PLT 177    Chemistries  Recent Labs  Lab 09/07/23 0939  NA 125*  K 3.0*  CL 82*  CO2 32  GLUCOSE 100*  BUN 13  CREATININE 0.59  CALCIUM 9.1   Cardiac Enzymes No results for input(s): "TROPONINI" in the last 168 hours. RADIOLOGY:  US  PELVIS (TRANSABDOMINAL ONLY) Result Date: 09/05/2023 CLINICAL DATA:  Vaginal bleeding EXAM: TRANSABDOMINAL ULTRASOUND OF PELVIS TECHNIQUE: Transabdominal ultrasound examination of the pelvis was performed including evaluation of the uterus, ovaries, adnexal regions, and pelvic cul-de-sac. COMPARISON:  CT 03/28/2020 and previous FINDINGS: Uterus Measurements: 6.2 x 2.9 x 4.6 cm = volume: 43 mL. Anteverted. No fibroids or other mass visualized. Endometrium Thickness: 8 mm.  No focal abnormality visualized. Right ovary Not identified Left ovary Not visualized No free fluid.  Suprapubic bladder catheter incidentally noted. IMPRESSION: 1. Negative for uterine mass 2. Endometrial thickening. 3. Nonvisualized ovaries. Electronically Signed   By: Nicoletta Barrier M.D.   On: 09/05/2023 15:36    Assessment and Plan Meredith Roach is a 83 y.o. female with medical history significant for incomplete quadriplegia, wheelchair dependent with chronic suprapubic catheter being  admitted to the hospital with concern for UTI in the setting of weakness and vaginal bleeding.    Acute cystitis/UTI suspected due to indwelling suprapubic catheter Neurogenic Bladder -- suprapubic catheter was changed on 19th of May by  urology PA - concerning due to weakness and mild confusion, which are consistent with her prior UTI. -Empiric IV Cipro  -09/05/23 urine culture-- Enterococcus.faecalis--change to amoxicillin  Vaginal bleeding-not severe, not related to UTI.  No obvious uterine mass on transabdominal pelvic ultrasound--showed endometrial thickening. - Consult GYN  Dr Arron Big to perform endometrial biopsy due to cervical stenosis. Other options were discussed with patient's husband and daughter. --pt to f/u Dr Everardo Hitch as out pt if they wish to pursue further --for now they would like ot hold off --megace prn rx for vaginal bleeding By Dr Everardo Hitch  Insomnia with confusion/visual hallucination --pt has not slept well for 2 nights --trial of trazodone at bedtime --addendum--spoke with pt's dter Meredith Roach --pt is not sleeping/eating/confused and "seeing men pushing her bed" will give trail of ativan 2 mg x1   Hyponatremia/hypokalemia/poor po intake/Dehydration  Weakness-making it difficult for family to care for her at home, may be related to UTI which is being treated as above.  --IV NS with KCl --encourage po intake   Diastolic CHF-continue metolazone and torsemide    Hypothyroidism- Synthroid    Functional/Spastic quadriplegia - continue Neurontin , dantrolene ,baclofen    GERD- omeprazole   Consider Palliative care consult  Family communication :husband and dter Consults :GYN CODE STATUS: FULL DVT Prophylaxis :Lovenox  Level of care: Med-Surg  TOTAL TIME TAKING CARE OF THIS PATIENT: 45 minutes.  >50% time spent on counselling and coordination of care  Note: This dictation was prepared with Dragon dictation along with smaller phrase technology. Any transcriptional errors that result from this process are unintentional.  Melvinia Stager M.D    [facility]   CC: Primary care physician; Sari Cunning, MD

## 2023-09-07 NOTE — Progress Notes (Signed)
 I spoke with her husband about the option of taking megace on a prn basis to stop the potentially annoying bleeding. They are aware that if she has a uterine cancer, this will not cure it, only will help with the bleeding.

## 2023-09-08 DIAGNOSIS — Z7189 Other specified counseling: Secondary | ICD-10-CM

## 2023-09-08 DIAGNOSIS — N3000 Acute cystitis without hematuria: Secondary | ICD-10-CM | POA: Diagnosis not present

## 2023-09-08 LAB — BASIC METABOLIC PANEL WITH GFR
Anion gap: 8 (ref 5–15)
BUN: 15 mg/dL (ref 8–23)
CO2: 36 mmol/L — ABNORMAL HIGH (ref 22–32)
Calcium: 9.4 mg/dL (ref 8.9–10.3)
Chloride: 83 mmol/L — ABNORMAL LOW (ref 98–111)
Creatinine, Ser: 0.59 mg/dL (ref 0.44–1.00)
GFR, Estimated: >60 mL/min (ref >60)
Glucose, Bld: 100 mg/dL — ABNORMAL HIGH (ref 70–99)
Potassium: 3.3 mmol/L — ABNORMAL LOW (ref 3.5–5.1)
Sodium: 127 mmol/L — ABNORMAL LOW (ref 135–145)

## 2023-09-08 LAB — GLUCOSE, CAPILLARY
Glucose-Capillary: 106 mg/dL — ABNORMAL HIGH (ref 70–99)
Glucose-Capillary: 99 mg/dL (ref 70–99)
Glucose-Capillary: 106 mg/dL — ABNORMAL HIGH (ref 70–99)
Glucose-Capillary: 112 mg/dL — ABNORMAL HIGH (ref 70–99)
Glucose-Capillary: 139 mg/dL — ABNORMAL HIGH (ref 70–99)

## 2023-09-08 MED ORDER — POTASSIUM CHLORIDE IN NACL 20-0.9 MEQ/L-% IV SOLN
INTRAVENOUS | Status: DC
  Filled 2023-09-08 (×3): qty 1000

## 2023-09-08 MED ORDER — POLYETHYLENE GLYCOL 3350 17 G PO PACK
17.0000 g | PACK | Freq: Every day | ORAL | Status: DC
  Administered 2023-09-08 – 2023-09-10 (×3): 17 g via ORAL
  Filled 2023-09-08 (×3): qty 1

## 2023-09-08 MED ORDER — BISACODYL 5 MG PO TBEC: 5.0000 mg | DELAYED_RELEASE_TABLET | Freq: Every day | ORAL | Status: DC | PRN

## 2023-09-08 MED ORDER — SENNOSIDES-DOCUSATE SODIUM 8.6-50 MG PO TABS
1.0000 | ORAL_TABLET | Freq: Two times a day (BID) | ORAL | Status: DC
  Administered 2023-09-08 – 2023-09-10 (×5): 1 via ORAL
  Filled 2023-09-08 (×5): qty 1

## 2023-09-08 MED ORDER — BISACODYL 10 MG RE SUPP: 10.0000 mg | Freq: Every day | RECTAL | Status: DC | PRN

## 2023-09-08 NOTE — Progress Notes (Signed)
 MEWS Progress Note  Patient Details Name: Meredith Roach MRN: [id number] DOB: 1940-07-15 Today's Date: 09/08/2023   MEWS Flowsheet Documentation:  Assess: MEWS Score Temp: (!) 94.5 F (34.7 C) BP: (!) 108/55 MAP (mmHg): 70 Pulse Rate: 63 ECG Heart Rate: 91 Resp: 17 Level of Consciousness: Alert SpO2: 95 % O2 Device: Room Air Assess: MEWS Score MEWS Temp: 2 MEWS Systolic: 0 MEWS Pulse: 0 MEWS RR: 0 MEWS LOC: 0 MEWS Score: 2 MEWS Score Color: Yellow Assess: SIRS CRITERIA SIRS Temperature : 1 SIRS Respirations : 0 SIRS Pulse: 0 SIRS WBC: 0 SIRS Score Sum : 1 SIRS Temperature : 1 SIRS Pulse: 0 SIRS Respirations : 0 SIRS WBC: 0 SIRS Score Sum : 1 Assess: if the MEWS score is Yellow or Red Were vital signs accurate and taken at a resting state?: Yes Does the patient meet 2 or more of the SIRS criteria?: No MEWS guidelines implemented : Yes, yellow Treat MEWS Interventions: Considered administering scheduled or prn medications/treatments as ordered Take Vital Signs Increase Vital Sign Frequency : Yellow: Q2hr x1, continue Q4hrs until patient remains green for 12hrs Escalate MEWS: Escalate: Yellow: Discuss with charge nurse and consider notifying provider and/or RRT    Pt provided with warm blankets and hot tea.  Will recheck vitals at 0600.    Adah Hollering 09/08/2023, 5:27 AM

## 2023-09-08 NOTE — Consult Note (Signed)
 Consultation Note Date: 09/08/2023   Patient Name: Meredith Roach  DOB: 1941-01-19  MRN: [id number]  Age / Sex: 83 y.o., female  PCP: Sari Cunning, MD Referring Physician: Montey Apa, DO  Reason for Consultation: Establishing goals of care  HPI/Patient Profile:  PER H&P: "JALEXIS BREED is a 83 y.o. female with medical history significant for incomplete quadriplegia, wheelchair dependent with chronic suprapubic catheter being admitted to the hospital with concern for UTI in the setting of weakness and vaginal bleeding.  History is mainly provided by the patient's daughter who is at the bedside and very closely involved in her care.  Remarkably, it seems the patient has only had 1 significant UTI since her injury in 2021.  She has had a suprapubic catheter for about 3 years.  It is changed on a regular basis, was last done about 1 week ago.  For the last week or so, family has noted that the patient has been more weak than usual.  Typically, they are able to get her in a lift and move her around with some assistance from her, but in the last few days she has not been able to assist.  Daughter also states that patient is insisting that she has a foreign object in her mouth, even though there is nothing present there interestingly, she had similar complaints when she had a UTI in the past.  In any case, both the patient and her daughter deny any fevers, chills, nausea, vomiting, pain.  They have noticed for the last week she has had some intermittent spotting of blood from the vagina, there is no pain associated with it."    Clinical Assessment and Goals of Care: Notes and and labs reviewed from current and previous hospitalizations, and office visits.  In to see patient.  She is currently resting in bed, sleeping soundly with her daughter Archibald Beard at bedside.  Archibald Beard states she has a sister can who works for  [employer].  She discusses that patient currently lives alone at home with her husband, their father Aron Lard.  She discusses patient's fall with resulting incomplete quadriplegia in 2021.  She states that for a few years patient and her husband lived with her other daughter.  She states patient and her husband just moved into their own home around a year ago.  She discusses that patient's husband just had a heart attack around a month ago.  She discusses concern his health and ability to care for their mother.  She states they have caregivers coming into the home a few hours in the mornings to help provide care, most days of the week.  She states he stays with them on the weekend to help. She discusses her mother's decline, and difficulty with moving her.  She discusses her skin breakdown from sitting in the wheelchair. She discusses QOL and her parent's value of independence.  She discusses that her mother had been hallucinating upon admission to the hospital, but has not been hallucinating today.  He states at  baseline she has no cognitive deficits.  Discussed meeting with patient tomorrow morning and speaking with her husband.   SUMMARY OF RECOMMENDATIONS   PMT will follow up tomorrow morning.      Primary Diagnoses: Present on Admission:  UTI (urinary tract infection)   I have reviewed the medical record, interviewed the patient and family, and examined the patient. The following aspects are pertinent.  Past Medical History:  Diagnosis Date   Arthritis    GERD (gastroesophageal reflux disease)    Gout    Neuromuscular disorder (HCC)    Pre-diabetes    Social History   Socioeconomic History   Marital status: Married    Spouse name: Aron Lard   Number of children: 2   Years of education: Not on file   Highest education level: Not on file  Occupational History   Not on file  Tobacco Use   Smoking status: Never    Passive exposure: Never   Smokeless tobacco: Never  Vaping Use   Vaping  status: Never Used  Substance and Sexual Activity   Alcohol use: No   Drug use: No   Sexual activity: Not Currently  Other Topics Concern   Not on file  Social History Narrative   Lives with daughter   Social Drivers of Health   Financial Resource Strain: Low Risk  (08/23/2023)   Received from [facility]   Overall Financial Resource Strain (CARDIA)    Difficulty of Paying Living Expenses: Not very hard  Food Insecurity: No Food Insecurity (09/05/2023)   Hunger Vital Sign    Worried About Running Out of Food in the Last Year: Never true    Ran Out of Food in the Last Year: Never true  Transportation Needs: No Transportation Needs (09/05/2023)   PRAPARE - Administrator, Civil Service (Medical): No    Lack of Transportation (Non-Medical): No  Recent Concern: Transportation Needs - Unmet Transportation Needs (08/23/2023)   Received from [facility] - Transportation    In the past 12 months, has lack of transportation kept you from medical appointments or from getting medications?: Yes    Lack of Transportation (Non-Medical): Yes  Physical Activity: Not on file  Stress: Not on file  Social Connections: Unknown (09/05/2023)   Social Connection and Isolation Panel [NHANES]    Frequency of Communication with Friends and Family: Twice a week    Frequency of Social Gatherings with Friends and Family: Three times a week    Attends Religious Services: Patient declined    Active Member of Clubs or Organizations: Patient declined    Attends Banker Meetings: Never    Marital Status: Married   Family History  Problem Relation Age of Onset   Breast cancer Maternal Aunt    Breast cancer Paternal Aunt    CAD Father    Heart attack Father    Scheduled Meds:  amoxicillin  500 mg Oral Q8H   vitamin C  500 mg Oral BID   baclofen   20 mg Oral QID   dantrolene   100 mg Oral TID   DULoxetine   60 mg Oral QHS   enoxaparin   (LOVENOX ) injection  40 mg Subcutaneous Q24H   gabapentin   1,200 mg Oral TID   leptospermum manuka honey  1 Application Topical Daily   levothyroxine   100 mcg Oral Daily   multivitamin with minerals  1 tablet Oral Daily   nutrition supplement (JUVEN)  1 packet Oral  BID BM   pantoprazole   40 mg Oral QHS   PARoxetine   20 mg Oral Daily   polyethylene glycol  17 g Oral Daily   potassium chloride   10 mEq Oral Daily   senna-docusate  1 tablet Oral BID   traZODone  50 mg Oral QHS   zinc sulfate (50mg  elemental zinc)  220 mg Oral Daily   Continuous Infusions:  0.9 % NaCl with KCl 20 mEq / L 75 mL/hr at 09/08/23 1151   PRN Meds:.acetaminophen  **OR** acetaminophen , albuterol, bisacodyl , bisacodyl , ondansetron  **OR** ondansetron  (ZOFRAN ) IV, traMADol  Medications Prior to Admission:  Prior to Admission medications   Medication Sig Start Date End Date Taking? Authorizing Provider  baclofen  (LIORESAL ) 20 MG tablet Take 1 tablet (20 mg total) by mouth 4 (four) times daily. 07/06/23  Yes Lovorn, Megan, MD  ciprofloxacin  (CIPRO ) 500 MG tablet Take 500 mg by mouth 2 (two) times daily.   Yes [provider]  cyproheptadine  (PERIACTIN ) 4 MG tablet Take 1 tablet (4 mg total) by mouth 3 (three) times daily as needed (baclofen  withdrawal). 06/16/22  Yes Lovorn, Megan, MD  dantrolene  (DANTRIUM ) 100 MG capsule Take 1 capsule (100 mg total) by mouth 3 (three) times daily. With Baclofen - Zanaflex  causes sedation 07/16/23  Yes Lovorn, Megan, MD  DULoxetine  (CYMBALTA ) 60 MG capsule Take 1 capsule (60 mg total) by mouth at bedtime. 07/16/23  Yes Lovorn, Megan, MD  gabapentin  (NEURONTIN ) 600 MG tablet Take 2 tablets (1,200 mg total) by mouth 3 (three) times daily. 07/16/23  Yes Lovorn, Megan, MD  levothyroxine  (SYNTHROID ) 100 MCG tablet Take 1 tablet (100 mcg total) by mouth daily. Take on an empty stomach with a glass of water at least 30-60 minutes before breakfast. 08/23/23  Yes   lidocaine  (XYLOCAINE ) 5 % ointment  Apply 1 application. topically as needed. To LLE- no more than foot/ankle/front of leg to knee at a time- up to 4x/day 09/01/21  Yes Lovorn, Megan, MD  metolazone (ZAROXOLYN) 2.5 MG tablet Take 1.25 mg by mouth 2 (two) times a week. 10/09/22 10/09/23 Yes [provider]  omeprazole  (PRILOSEC) 40 MG capsule Take 1 capsule (40 mg total) by mouth daily. 08/16/23  Yes   PARoxetine  (PAXIL ) 20 MG tablet Take 1 tablet (20 mg total) by mouth daily. 07/16/23  Yes Lovorn, Megan, MD  potassium chloride  (KLOR-CON ) 8 MEQ tablet Take 8 mEq by mouth daily. 10/08/22 10/08/23 Yes [provider]  torsemide  (DEMADEX ) 10 MG tablet Take 1 tablet (10 mg total) by mouth once daily 08/06/23  Yes   traMADol  (ULTRAM ) 50 MG tablet Take 1 tablet (50 mg total) by mouth every 6 (six) hours as needed. 07/16/23  Yes Lovorn, Megan, MD  megestrol (MEGACE) 40 MG/ML suspension Take 1 mL (40 mg total) by mouth 2 (two) times daily. 09/07/23   Dove, Myra C, MD   Allergies  Allergen Reactions   Ace Inhibitors Anaphylaxis   Review of Systems  Unable to perform ROS   Physical Exam Constitutional:      Comments: Eyes closed  Pulmonary:     Effort: Pulmonary effort is normal.  Skin:    General: Skin is warm and dry.     Vital Signs: BP (!) 97/34 (BP Location: Right Arm)   Pulse 63   Temp (!) 97.4 F (36.3 C)   Resp 18   Ht 5\' 2"  (1.575 m)   Wt 71.4 kg   SpO2 99%   BMI 28.79 kg/m  Pain Scale: 0-10 POSS *See  Group Information*: 1-Acceptable,Awake and alert Pain Score: 0-No pain   SpO2: SpO2: 99 % O2 Device:SpO2: 99 % O2 Flow Rate: .   IO: Intake/output summary:  Intake/Output Summary (Last 24 hours) at 09/08/2023 1538 Last data filed at 09/08/2023 1415 Gross per 24 hour  Intake 1516.49 ml  Output 1850 ml  Net -333.51 ml    LBM: Last BM Date : 09/08/23 Baseline Weight: Weight: 71.4 kg Most recent weight: Weight: 71.4 kg       Signed by: Meribeth Standard, NP   Please contact Palliative Medicine  Team phone at [phone] for questions and concerns.  For individual provider: See Tilford Foley

## 2023-09-08 NOTE — Progress Notes (Addendum)
 Progress Note   Patient: Meredith Roach EAV:[id number] DOB: 10/17/1940 DOA: 09/05/2023     2 DOS: the patient was seen and examined on 09/08/2023   Brief hospital course: 83 year old female with PMHx of  incomplete quadriplegia, wheelchair dependent with chronic suprapubic catheter being admitted to the hospital with concern for UTI in the setting of weakness and vaginal bleeding.   See H&P for full HPI on admission & ED course.  Patient was admitted for further evaluation and management of UTI, started on empiric IV Cipro  initially. Urine culture subsequently grew Enterococcus faecalis and antibiotic was changed to PO amoxicillin on 5/27 based on culture susceptibilities.  Hospital course complicated by hypothermia and soft BP's, persistent mental status changes including hallucinations, poor PO intake with electrolyte derangements requiring IV fluids.     Further hospital course and management as outlined below.    Assessment and Plan:  Acute cystitis/UTI suspected due to indwelling suprapubic catheter Neurogenic Bladder Suprapubic catheter was changed on 19th of May by urology PA Concerning due to weakness and mild confusion, which are consistent with her prior UTI. -Initially placed on empiric IV Cipro  --Antibiotic changed to PO amoxicillin with urine culture growing Enterococcus faecalis   Vaginal bleeding-not severe, not related to UTI.  No obvious uterine mass on transabdominal pelvic ultrasound--showed endometrial thickening. - Consult GYN  Dr Arron Big to perform endometrial biopsy due to cervical stenosis. Other options were discussed with patient's husband and daughter. --pt to f/u Dr Everardo Hitch as out pt if they wish to pursue further --for now they would like ot hold off --megace prn rx for vaginal bleeding By Dr Everardo Hitch   Insomnia with confusion/visual hallucination Pt has not slept well for few nights --trial of trazodone at bedtime --addendum--spoke with pt's dter Meredith Roach  --pt is not sleeping/eating/confused and "seeing men pushing her bed" -- given trial of ativan 2 mg x1    Hyponatremia/hypokalemia/poor po intake/Dehydration  Weakness-making it difficult for family to care for her at home, may be related to UTI which is being treated as above.  --continue IV NS with Kcl for another day --encourage po intake   Chronic Diastolic CHF - clinically hypovolemic, on IV fluids --hold metolazone and torsemide  while on IV fluids --monitor volume status closely   Hypothyroidism --on Synthroid    Functional/Spastic quadriplegia  --on Neurontin , dantrolene , baclofen    GERD --on omeprazole    Goals of care --Palliative care consulted at request of patient's family      Subjective: Pt seen with family at bedside today, pt sleeping and was difficult to awaken, but once awake she would answer questions appropriately, but not very conversational.  Family report pt knows she was seeing people in her room but does not think she was hallucinating.  Pt denies acute complaints.  Family note some improvement since admission, report she was more awake and interactive earlier this AM.     Physical Exam: Vitals:   09/07/23 2022 09/08/23 0420 09/08/23 0601 09/08/23 0725  BP: (!) 95/45 (!) 108/55  (!) 97/34  Pulse: (!) 57 63  63  Resp:  17  18  Temp:  (!) 94.5 F (34.7 C) (!) 97.3 F (36.3 C) (!) 97.4 F (36.3 C)  TempSrc:  Rectal Oral   SpO2: 98% 95%  99%  Weight:      Height:       General exam: sleeping comfortably, arouses to verbal plus tactile stimulus, no acute distress HEENT:moist mucus membranes, hearing grossly normal  Respiratory system: CTAB,  no wheezes, rales or rhonchi, normal respiratory effort. Cardiovascular system: normal S1/S2, RRR Gastrointestinal system: soft, NT, ND Genitourinary system: suprapubic catheter in place Central nervous system: no gross focal neurologic deficits, normal speech, limited exam due to AMS Extremities: moves all , no  edema, normal tone Skin: dry, intact, normal temperature Psychiatry: normal mood, congruent affect, confused   Data Reviewed:  Notable labs --   Na 125 >> 127 ( on IVF's) K 3.0 >> 3.3 Cl 83 Bicarb 36 Glucose 100 CBG's in 90's to 100's   Family Communication: Husband and two daughters at bedside on rounds  Disposition: Status is: Inpatient Remains inpatient appropriate because: persistent encephalopathy, hypothermic overnight, requires further clinical improvement    Planned Discharge Destination: Home with Home Health    Time spent: 45 minutes  Author: Montey Apa, DO 09/08/2023 12:55 PM  For on call review [email or web address].

## 2023-09-08 NOTE — Plan of Care (Signed)

## 2023-09-08 NOTE — Progress Notes (Signed)
 Pt was admitted on 09/05/23 for a UTI and vaginal bleeding. The UTI caused AMS in the patient as one typically does in the older demographic. According to the pt's daughter, the pt was awake for roughly 60-72 hours, getting more and more agitated along with developing hallucinations. It was agreed upon between the provider and the pt's daughter to give her ativan. A 1x dose of 2mg  was administered around 1700 yesterday evening. As desired the pt went to sleep, and slept until almost 0215 this morning. Well, when we attempted to get the pt's vitals this morning, we could only obtain a rectal temp, and it was "94.5". I wrapped the pt in warm blankets and placed heating packs on the key areas, making sure to protect her skin, and offering her some hot tea. She finally had an oral temp of 97.3 after 30-45 minutes.

## 2023-09-09 DIAGNOSIS — Z789 Other specified health status: Secondary | ICD-10-CM | POA: Diagnosis not present

## 2023-09-09 DIAGNOSIS — N3000 Acute cystitis without hematuria: Secondary | ICD-10-CM | POA: Diagnosis not present

## 2023-09-09 LAB — BASIC METABOLIC PANEL WITH GFR
Anion gap: 10 (ref 5–15)
BUN: 26 mg/dL — ABNORMAL HIGH (ref 8–23)
CO2: 30 mmol/L (ref 22–32)
Calcium: 9.4 mg/dL (ref 8.9–10.3)
Chloride: 92 mmol/L — ABNORMAL LOW (ref 98–111)
Creatinine, Ser: 0.63 mg/dL (ref 0.44–1.00)
GFR, Estimated: >60 mL/min (ref >60)
Glucose, Bld: 87 mg/dL (ref 70–99)
Potassium: 3.1 mmol/L — ABNORMAL LOW (ref 3.5–5.1)
Sodium: 132 mmol/L — ABNORMAL LOW (ref 135–145)

## 2023-09-09 LAB — MAGNESIUM: Magnesium: 1.8 mg/dL (ref 1.7–2.4)

## 2023-09-09 LAB — GLUCOSE, CAPILLARY
Glucose-Capillary: 87 mg/dL (ref 70–99)
Glucose-Capillary: 94 mg/dL (ref 70–99)

## 2023-09-09 LAB — PHOSPHORUS: Phosphorus: 4.3 mg/dL (ref 2.5–4.6)

## 2023-09-09 MED ORDER — HALOPERIDOL LACTATE 2 MG/ML PO CONC: 0.5000 mg | ORAL | Status: DC | PRN

## 2023-09-09 MED ORDER — OXYCODONE HCL 5 MG PO TABS
5.0000 mg | ORAL_TABLET | Freq: Four times a day (QID) | ORAL | Status: DC | PRN
  Administered 2023-09-09 (×2): 5 mg via ORAL
  Filled 2023-09-09: qty 1

## 2023-09-09 MED ORDER — HALOPERIDOL 0.5 MG PO TABS
0.5000 mg | ORAL_TABLET | ORAL | Status: DC | PRN
Start: 2023-09-09 — End: 2023-09-10

## 2023-09-09 MED ORDER — GLYCOPYRROLATE 0.2 MG/ML IJ SOLN
0.2000 mg | INTRAMUSCULAR | Status: DC | PRN
Start: 2023-09-09 — End: 2023-09-10

## 2023-09-09 MED ORDER — GLYCOPYRROLATE 0.2 MG/ML IJ SOLN: 0.2000 mg | INTRAMUSCULAR | Status: DC | PRN

## 2023-09-09 MED ORDER — OXYCODONE HCL 5 MG PO TABS
2.5000 mg | ORAL_TABLET | ORAL | Status: DC | PRN
  Administered 2023-09-10: 5 mg via ORAL
  Filled 2023-09-09 (×2): qty 1

## 2023-09-09 MED ORDER — POLYVINYL ALCOHOL 1.4 % OP SOLN: 1.0000 [drp] | Freq: Four times a day (QID) | OPHTHALMIC | Status: DC | PRN

## 2023-09-09 MED ORDER — BIOTENE DRY MOUTH MT LIQD: 15.0000 mL | OROMUCOSAL | Status: DC | PRN

## 2023-09-09 MED ORDER — GLYCOPYRROLATE 1 MG PO TABS: 1.0000 mg | ORAL_TABLET | ORAL | Status: DC | PRN

## 2023-09-09 MED ORDER — DIAZEPAM 5 MG PO TABS: 5.0000 mg | ORAL_TABLET | Freq: Four times a day (QID) | ORAL | Status: DC | PRN

## 2023-09-09 MED ORDER — HALOPERIDOL LACTATE 5 MG/ML IJ SOLN: 0.5000 mg | INTRAMUSCULAR | Status: DC | PRN

## 2023-09-09 NOTE — Progress Notes (Signed)
 Progress Note   Patient: Meredith Roach:[id number] DOB: 01/26/1941 DOA: 09/05/2023     3 DOS: the patient was seen and examined on 09/09/2023   Brief hospital course: 83 year old female with PMHx of  incomplete quadriplegia, wheelchair dependent with chronic suprapubic catheter being admitted to the hospital with concern for UTI in the setting of weakness and vaginal bleeding.   See H&P for full HPI on admission & ED course.  Patient was admitted for further evaluation and management of UTI, started on empiric IV Cipro  initially. Urine culture subsequently grew Enterococcus faecalis and antibiotic was changed to PO amoxicillin on 5/27 based on culture susceptibilities.  Hospital course complicated by hypothermia and soft BP's, persistent mental status changes including hallucinations, poor PO intake with electrolyte derangements requiring IV fluids.     Further hospital course and management as outlined below.    Assessment and Plan:  Acute cystitis/UTI suspected due to indwelling suprapubic catheter Neurogenic Bladder Suprapubic catheter was changed on 19th of May by urology PA Concerning due to weakness and mild confusion, which are consistent with her prior UTI. -Initially placed on empiric IV Cipro  --Antibiotic changed to PO amoxicillin with urine culture growing Enterococcus faecalis   Vaginal bleeding-not severe, not related to UTI.  No obvious uterine mass on transabdominal pelvic ultrasound--showed endometrial thickening. - Consult GYN  Dr Arron Big to perform endometrial biopsy due to cervical stenosis. Other options were discussed with patient's husband and daughter. --pt to f/u Dr Everardo Hitch as out pt if they wish to pursue further --for now they would like to hold off --megace prn rx for vaginal bleeding By Dr Everardo Hitch   Insomnia with confusion/visual hallucination Pt has not slept well for few nights --trial of trazodone at bedtime --addendum--spoke with pt's dter Meredith Roach  --pt is not sleeping/eating/confused and "seeing men pushing her bed" -- given trial of ativan 2 mg x1    Hyponatremia/hypokalemia/poor po intake/Dehydration  Weakness-making it difficult for family to care for her at home, may be related to UTI which is being treated as above.  --Fluids finishing today -- on IV NS with 20 mEq Kcl  --encourage po intake   Chronic Diastolic CHF - clinically hypovolemic, on IV fluids --hold metolazone and torsemide  while on IV fluids --monitor volume status closely   Hypothyroidism --on Synthroid    Functional/Spastic quadriplegia  --on Neurontin , dantrolene , baclofen    GERD --on omeprazole    Goals of care --Palliative care consulted at request of patient's family --Plan is d/c home with hospice --Hospice is ordering and delivering DME to home prior to d/c     Subjective: Pt seen with family at bedside today.  They met with palliative care earlier and plan to d/c home with hospice tomorrow.  Pt reports feeling better today, mental status back to baseline.  She reports diffuse pain, recently given medication that is starting to help somewhat.  Pt denies other complaints.     Physical Exam: Vitals:   09/08/23 1616 09/08/23 2022 09/09/23 0355 09/09/23 0807  BP: (!) 97/46 136/66 (!) 111/49 (!) 118/39  Pulse: 65 71 65 66  Resp: 19 16  18   Temp: (!) 97.5 F (36.4 C) 98 F (36.7 C) 98.3 F (36.8 C) 98.7 F (37.1 C)  TempSrc:      SpO2: 99% 99% 98% 100%  Weight:      Height:       General exam: awake & alert, no acute distress HEENT: moist mucus membranes, hearing grossly normal  Respiratory system:  CTAB, no wheezes, rales or rhonchi, normal respiratory effort. On room air Cardiovascular system: normal S1/S2, RRR Gastrointestinal system: soft, NT, ND Genitourinary system: suprapubic catheter in place Central nervous system: no gross focal neurologic deficits, normal speech, A&Ox3 Extremities: moves all , no edema, normal tone Skin: dry,  intact, normal temperature Psychiatry: normal mood, congruent affect, confused   Data Reviewed:  Notable labs --   Na 125 >> 127 (on IVF's) >> 132 K 3.0 >> 3.3 >> 3.1 Cl 83 >> 92 Bicarb 26    Family Communication: Family were at bedside on rounds  Disposition: Status is: Inpatient Remains inpatient appropriate because: plan to d/c home with hospice tomorrow, pending delivery of DME to the home.    Planned Discharge Destination: Home with Home Health    Time spent: 35 minutes  Author: Montey Apa, DO 09/09/2023 2:28 PM  For on call review [email or web address].

## 2023-09-09 NOTE — Progress Notes (Signed)
 Civil engineer, contracting [facility]) Hospital Liaison Note  Received request from Rozelle Corning, RN , Transitions of Care Manager, for hospice services at home after discharge.  Spoke with patient and family to initiate education related to hospice philosophy, services, and team approach to care.  Patient and family verbalized understanding of information given.  Per discussion, the plan is for discharge home by EMS tomorrow after DME is delivered.    DME needs discussed.  Patient has the following equipment in the home:  Wheelchiar.    Patient/family requests the following equipment in the home: Hospital bed with air mattress and hoyer lift.    The address has been verified and is correct in the chart. Carylon Claude and phone number [phone] is the family contact to arrange time of equipment delivery.    Please send signed and completed DNR home with the patient/family.  Please provide prescriptions at discharge as needed to ensure ongoing symptom management.   [facility] information and contact numbers given to family  Above information shared with  Zenobia Frazier,RN, Transitions of Care Manager.     Please call with any Hospice related questions or concerns.  Thank you for the opportunity to participate in this patient's care.  Helmut Lobe, [facility] Liaison [phone]

## 2023-09-09 NOTE — Plan of Care (Signed)

## 2023-09-09 NOTE — Progress Notes (Addendum)
 Daily Progress Note   Patient Name: Meredith Roach       Date: 09/09/2023 DOB: 09-Sep-1940  Age: 83 y.o. MRN#: [id number] Attending Physician: Montey Apa, DO Primary Care Physician: Sari Cunning, MD Admit Date: 09/05/2023  Reason for Consultation/Follow-up: Establishing goals of care  Subjective: Notes and labs reviewed.  In to see patient.  She is currently resting in bed with her husband Aron Lard and daughter Archibald Beard at bedside.  Archibald Beard stepped out during my initial discussion with patient and husband.  Patient is oriented.  They discussed concerns regarding their family, and current worries about the status of a granddaughter's pregnancy who lives in [place] .  They discussed Fred's heart attack about a month ago.  They discuss their love for their family.  They discussed patient falling while working in the yard, living with daughter Burdette Carolin and her family for around 3 years and then their ability to move to a home of their own alone again.  Discussed that over the past few weeks patient has been unable to use her arms as well as she had been, and is unable to stand.  She discusses poor quality of life.  We discussed a Nurse, adult and they are discussing hiring further assistance.    Daughter Archibald Beard rejoined conversation.  We discussed her diagnoses, prognosis, GOC, EOL wishes disposition and options.  Created space and opportunity for patient  to explore thoughts and feelings regarding current medical information.   A detailed discussion was had today regarding advanced directives.  Concepts specific to code status, artifical feeding and hydration, IV antibiotics and rehospitalization were discussed.  The difference between an aggressive medical intervention path and a comfort care path was  discussed.  Values and goals of care important to patient and family were attempted to be elicited.  Discussed limitations of medical interventions to prolong quality of life in some situations and discussed the concept of human mortality.    Latica discusses not wanting to be a burden on her husband or family.  She discusses that she is tired of suffering and being in pain.  She states she is ready to go home and be with the Lord.  Aron Lard states the patient has been praying for God to call her home.  Aaylah shares that she and Aron Lard have discussed her feelings,  but she has not spoken with her daughters about it.  In she clear she does not want any further life-prolonging care.  We discussed home with hospice.  Family would like to speak with Burdette Carolin about this prior to making changes; patient is in agreement.  At time of exiting room, daughter Burdette Carolin joined conversation.  We recapped the content of the conversation prior.  Patient states he is ready to go and Burdette Carolin voices understanding of her mother's wishes.  We discussed home with hospice.  We discussed symptom management.  Again patient is clear she does not want any life-prolonging care including antibiotic therapy.  They state they will start speaking with family members.  Offered to join conversations as needed and they decline needs at this time.  Stepped out of room and husband followed.  He discussed that he knew that his wife's prognosis was not good based on discussions with other providers over the past few weeks.  He discusses concern over his wife's mother who is 35 years old, and how she will respond to the news.  He discusses that patient's sister should be going for cardiac bypass surgery middle of next week.  Again offered to join family conversations as needed.  Plan was discussed with attending, TOC, hospice liaison, patient's nurse. All  Returned to bedside to complete a MOST form.  I completed a MOST form today with patient, husband, both  children.,  And the signed original was placed in the chart.  The form was signed by patient's husband as patient is unable to use her hands well enough to sign the form herself.  Each section of options on the form were reviewed in detail and any questions were answered as needed. The form was scanned and sent to medical records for it to be uploaded under ACP tab in Epic. A photocopy was also placed in the chart to be scanned into EMR. The patient outlined their wishes for the following treatment decisions:  Cardiopulmonary Resuscitation: Do Not Attempt Resuscitation (DNR/No CPR)  Medical Interventions: Comfort Measures: Keep clean, warm, and dry. Use medication by any route, positioning, wound care, and other measures to relieve pain and suffering. Use oxygen, suction and manual treatment of airway obstruction as needed for comfort. Do not transfer to the hospital unless comfort needs cannot be met in current location.  Antibiotics: No antibiotics (use other measures to relieve symptoms)  IV Fluids: No IV fluids (provide other measures to ensure comfort)  Feeding Tube: No feeding tube          Length of Stay: 3  Current Medications: Scheduled Meds:   baclofen   20 mg Oral QID   dantrolene   100 mg Oral TID   DULoxetine   60 mg Oral QHS   gabapentin   1,200 mg Oral TID   leptospermum manuka honey  1 Application Topical Daily   levothyroxine   100 mcg Oral Daily   nutrition supplement (JUVEN)  1 packet Oral BID BM   pantoprazole   40 mg Oral QHS   PARoxetine   20 mg Oral Daily   polyethylene glycol  17 g Oral Daily   senna-docusate  1 tablet Oral BID   traZODone  50 mg Oral QHS    Continuous Infusions:   PRN Meds: acetaminophen  **OR** acetaminophen , albuterol, antiseptic oral rinse, bisacodyl , bisacodyl , diazepam , glycopyrrolate  **OR** glycopyrrolate  **OR** glycopyrrolate , haloperidol **OR** haloperidol **OR** haloperidol lactate, ondansetron  **OR** ondansetron  (ZOFRAN ) IV, oxyCODONE ,  oxyCODONE , polyvinyl alcohol  Physical Exam Pulmonary:     Effort: Pulmonary effort  is normal.  Skin:    General: Skin is warm and dry.     Comments: Pressure sore on patient's sacral area.  Neurological:     Mental Status: She is alert.             Vital Signs: BP (!) 118/39 (BP Location: Right Arm)   Pulse 66   Temp 98.7 F (37.1 C)   Resp 18   Ht 5\' 2"  (1.575 m)   Wt 71.4 kg   SpO2 100%   BMI 28.79 kg/m  SpO2: SpO2: 100 % O2 Device: O2 Device: Room Air O2 Flow Rate:    Intake/output summary:  Intake/Output Summary (Last 24 hours) at 09/09/2023 1303 Last data filed at 09/09/2023 1025 Gross per 24 hour  Intake 1496.25 ml  Output 2450 ml  Net -953.75 ml   LBM: Last BM Date : 09/08/23 Baseline Weight: Weight: 71.4 kg Most recent weight: Weight: 71.4 kg  Patient Active Problem List   Diagnosis Date Noted   Vaginal bleeding 09/06/2023   UTI (urinary tract infection) 09/05/2023   Suprapubic catheter (HCC) 12/05/2021   Generalized anxiety disorder 01/24/2021   Suspected deep tissue injury 10/25/2020   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 She does not want any further life-prolonging care including continued antibiotic therapy. Symptom management orders placed.  Patient is clear she does not believe that the tramadol  she had been taking prior to this admission was helping at all..  Code Status:    Code Status Orders  (From admission, onward)           Start     Ordered   09/09/23 1230  Do not attempt resuscitation (DNR) -  Comfort care  Continuous       Question Answer Comment  If patient has no pulse and is not breathing Do Not Attempt Resuscitation   In Pre-Arrest Conditions (Patient Is Breathing and Has a Pulse) Provide comfort measures. Relieve any mechanical airway obstruction. Avoid transfer unless required for comfort.   Consent: Discussion documented in EHR or advanced directives reviewed      09/09/23 1230           Code Status History     Date Active Date Inactive Code Status Order ID Comments User Context   09/09/2023 1224 09/09/2023 1230 Do not attempt resuscitation (DNR) - Comfort care [id number]  Meribeth Standard, NP Inpatient   09/05/2023 1610 09/09/2023 1224 Full Code [id number]  Gaylin Ke, MD ED   08/31/2019 1801 09/29/2019 1838 Full Code [id number]  Sterling Eisenmenger, PA-C Inpatient   08/31/2019 1801 08/31/2019 1801 Full Code [id number]  Sterling Eisenmenger, PA-C Inpatient   08/28/2019 1849 08/31/2019 1724 Full Code [id number]  Isadora Mar, MD Inpatient   08/26/2019 2242 08/28/2019 1848 Full Code [id number]  Isadora Mar, MD ED   10/21/2016 2105 10/22/2016 1736 Full Code [id number]  McKenzie, Albert Almas, PA-C Inpatient       Prognosis:  < 6 weeks.    Thank you for allowing the Palliative Medicine  Team to assist in the care of this patient.   Time In: 9:40 12:00 Time Out: 11:45 12:20 Total Time 125 min ACP Prolonged Time Billed  yes     Meribeth Standard, NP  Please contact Palliative Medicine Team phone at [phone] for questions and concerns.

## 2023-09-09 NOTE — Care Management Important Message (Signed)
 Important Message  Patient Details  Name: Meredith Roach MRN: [id number] Date of Birth: 1940-08-09   Important Message Given:  Yes - Medicare IM     Keren Alverio W, CMA 09/09/2023, 11:24 AM

## 2023-09-09 NOTE — TOC Progression Note (Signed)
 Transition of Care [facility]) - Progression Note    Patient Details  Name: Meredith Roach MRN: [id number] Date of Birth: 12-05-1940  Transition of [facility]) CM/SW Contact  Crayton Docker, RN Phone Number: 09/09/2023, 5:11 PM  Clinical Narrative:     CM to patient's room regarding discharge care planning. Patient's husband and daughter at patient's bedside. Patient's husband states,patient will discharge tomorrow with [facility] and DME, hospital bed, bedside table and hoyer lift to be delivered tomorrow prior to patient's discharge. CM alert to [name] regarding home hospice services and BLS transport. Per Fredrik Jensen, BLS transport requested transport at 1230 on 09/10/2023.  CM call to [facility]. CM spoke to [place], BLS transport scheduled for 1230 on 09/10/2023.  Expected Discharge Plan: Home w Home Health Services Barriers to Discharge: Continued Medical Work up  Expected Discharge Plan and Services     Post Acute Care Choice: Resumption of Svcs/PTA Provider Living arrangements for the past 2 months: Single Family Home       HH Arranged: RN HH Agency: [facility] Date HH Agency Contacted: 09/06/23   Representative spoke with at [facility] Agency: Shaun   Social Determinants of Health (SDOH) Interventions SDOH Screenings   Food Insecurity: No Food Insecurity (09/05/2023)  Housing: Low Risk  (09/05/2023)  Transportation Needs: No Transportation Needs (09/05/2023)  Recent Concern: Transportation Needs - Unmet Transportation Needs (08/23/2023)   Received from [facility]  Utilities: Not At Risk (09/05/2023)  Depression (PHQ2-9): Low Risk  (07/27/2023)  Financial Resource Strain: Low Risk  (08/23/2023)   Received from [facility]  Social Connections: Unknown (09/05/2023)  Tobacco Use: Low Risk  (09/05/2023)    Readmission Risk Interventions     No data to display

## 2023-09-10 ENCOUNTER — Other Ambulatory Visit: Payer: Self-pay

## 2023-09-10 DIAGNOSIS — Z515 Encounter for palliative care: Secondary | ICD-10-CM | POA: Diagnosis not present

## 2023-09-10 LAB — VITAMIN A: Vitamin A (Retinoic Acid): 40 ug/dL (ref 22.0–69.5)

## 2023-09-10 MED ORDER — POLYVINYL ALCOHOL 1.4 % OP SOLN: 1.0000 [drp] | Freq: Four times a day (QID) | OPHTHALMIC | Status: DC | PRN

## 2023-09-10 MED ORDER — BIOTENE DRY MOUTH MT LIQD: 15.0000 mL | OROMUCOSAL | Status: DC | PRN

## 2023-09-10 MED ORDER — DIAZEPAM 5 MG PO TABS
5.0000 mg | ORAL_TABLET | Freq: Four times a day (QID) | ORAL | 0 refills | Status: DC | PRN
  Filled 2023-09-10: qty 30, 9d supply, fill #0

## 2023-09-10 MED ORDER — TRAZODONE HCL 50 MG PO TABS
50.0000 mg | ORAL_TABLET | Freq: Every day | ORAL | 0 refills | Status: DC
Start: 2023-09-10 — End: 2023-11-18
  Filled 2023-09-10: qty 30, 30d supply, fill #0

## 2023-09-10 MED ORDER — OXYCODONE HCL 5 MG PO TABS
2.5000 mg | ORAL_TABLET | ORAL | 0 refills | Status: DC | PRN
  Filled 2023-09-10: qty 30, 5d supply, fill #0

## 2023-09-10 MED ORDER — ONDANSETRON HCL 4 MG PO TABS
4.0000 mg | ORAL_TABLET | Freq: Four times a day (QID) | ORAL | 0 refills | Status: DC | PRN
  Filled 2023-09-10: qty 20, 5d supply, fill #0

## 2023-09-10 MED ORDER — MEDIHONEY WOUND/BURN DRESSING EX PSTE
1.0000 | PASTE | Freq: Every day | CUTANEOUS | 0 refills | Status: DC
  Filled 2023-09-10: qty 44, 44d supply, fill #0

## 2023-09-10 MED ORDER — HALOPERIDOL 0.5 MG PO TABS
0.5000 mg | ORAL_TABLET | ORAL | 0 refills | Status: DC | PRN
  Filled 2023-09-10: qty 30, 5d supply, fill #0

## 2023-09-10 MED ORDER — ACETAMINOPHEN 325 MG PO TABS: 650.0000 mg | ORAL_TABLET | Freq: Four times a day (QID) | ORAL | Status: DC | PRN

## 2023-09-10 MED ORDER — SENNOSIDES-DOCUSATE SODIUM 8.6-50 MG PO TABS: 1.0000 | ORAL_TABLET | Freq: Two times a day (BID) | ORAL | Status: DC

## 2023-09-10 MED ORDER — GLYCOPYRROLATE 1 MG PO TABS
1.0000 mg | ORAL_TABLET | ORAL | 0 refills | Status: DC | PRN
  Filled 2023-09-10: qty 30, 5d supply, fill #0

## 2023-09-10 NOTE — Progress Notes (Signed)
 Nutrition Brief Note  Chart reviewed. Pt now transitioning to comfort care. Plan to d/c home with hospice. No further nutrition interventions planned at this time.  Please re-consult as needed.   Herschel Lords, RD, LDN, CDCES Registered Dietitian III Certified Diabetes Care and Education Specialist If unable to reach this RD, please use "RD Inpatient" group chat on secure chat between hours of 8am-4 pm daily

## 2023-09-10 NOTE — Progress Notes (Signed)
 Patient discharging home. All belongings sent with patient. IV removed.

## 2023-09-10 NOTE — Plan of Care (Signed)

## 2023-09-10 NOTE — TOC Progression Note (Signed)
 Transition of Care [facility]) - Progression Note    Patient Details  Name: Meredith Roach MRN: [id number] Date of Birth: October 03, 1940  Transition of Care [facility]) CM/SW Contact  Elsie Halo, RN Phone Number: 09/10/2023, 9:14 AM  Clinical Narrative:    Plan is for patient to dc to home with Home Hospice. TOC arranged transport with [facility]. [facility] will transport the patient at 12:30 pm today.    Expected Discharge Plan: Home w Home Health Services Barriers to Discharge: Continued Medical Work up  Expected Discharge Plan and Services     Post Acute Care Choice: Resumption of Svcs/PTA Provider Living arrangements for the past 2 months: Single Family Home                           HH Arranged: RN HH Agency: [facility] Date HH Agency Contacted: 09/06/23   Representative spoke with at [facility] Agency: Shaun   Social Determinants of Health (SDOH) Interventions SDOH Screenings   Food Insecurity: No Food Insecurity (09/05/2023)  Housing: Low Risk  (09/05/2023)  Transportation Needs: No Transportation Needs (09/05/2023)  Recent Concern: Transportation Needs - Unmet Transportation Needs (08/23/2023)   Received from [facility]  Utilities: Not At Risk (09/05/2023)  Depression (PHQ2-9): Low Risk  (07/27/2023)  Financial Resource Strain: Low Risk  (08/23/2023)   Received from [facility]  Social Connections: Unknown (09/05/2023)  Tobacco Use: Low Risk  (09/05/2023)    Readmission Risk Interventions     No data to display

## 2023-09-10 NOTE — Progress Notes (Addendum)
 Daily Progress Note   Patient Name: Meredith Roach       Date: 09/10/2023 DOB: 1940-09-21  Age: 83 y.o. MRN#: [id number] Attending Physician: Montey Apa, DO Primary Care Physician: Sari Cunning, MD Admit Date: 09/05/2023  Reason for Consultation/Follow-up: Establishing goals of care and Terminal Care  Subjective: Notes reviewed.  In to see patient.  She is currently resting in bed with daughter Burdette Carolin at bedside.  Patient denies complaint at this time and states oxycodone  she received yesterday helped her pain.  She is eager to go home.  Daughter discusses patient should be discharging soon to go home with hospice.  We discussed patient's hopes and goal to be able to stay at home to die, and not to return to the hospital moving forward.  We discussed ongoing symptom management and medication adjustments.  We discussed signs and symptoms and treatment.  We discussed open communication with patient and family regarding suffering and symptom management needs.  Daughter discusses that they have hired someone to help in the evenings.  Daughter discusses her father and other family members.  Patient fell asleep during discussion.  Questions answered of daughter regarding expectations moving forward and surrounding end-of-life.  She discusses that grandchildren and other family members have already begun to FaceTime and are planning to come and visit over the weekend.  Daughter received a phone call from her father and conversation ended at that time.  Length of Stay: 4  Current Medications: Scheduled Meds:   baclofen   20 mg Oral QID   dantrolene   100 mg Oral TID   DULoxetine   60 mg Oral QHS   gabapentin   1,200 mg Oral TID   leptospermum manuka honey  1 Application Topical Daily    levothyroxine   100 mcg Oral Daily   nutrition supplement (JUVEN)  1 packet Oral BID BM   pantoprazole   40 mg Oral QHS   PARoxetine   20 mg Oral Daily   polyethylene glycol  17 g Oral Daily   senna-docusate  1 tablet Oral BID   traZODone  50 mg Oral QHS    Continuous Infusions:   PRN Meds: acetaminophen  **OR** acetaminophen , albuterol, antiseptic oral rinse, bisacodyl , bisacodyl , diazepam , glycopyrrolate  **OR** glycopyrrolate  **OR** glycopyrrolate , haloperidol **OR** haloperidol **OR** haloperidol lactate, ondansetron  **OR** ondansetron  (ZOFRAN ) IV, oxyCODONE , oxyCODONE , polyvinyl alcohol  Physical Exam Pulmonary:     Effort: Pulmonary effort is normal.  Skin:    General: Skin is warm and dry.  Neurological:     Mental Status: She is alert.             Vital Signs: BP (!) 127/52 (BP Location: Right Arm)   Pulse (!) 58   Temp 98.2 F (36.8 C)   Resp 16   Ht 5\' 2"  (1.575 m)   Wt 71.4 kg   SpO2 100%   BMI 28.79 kg/m  SpO2: SpO2: 100 % O2 Device: O2 Device: Room Air O2 Flow Rate:    Intake/output summary:  Intake/Output Summary (Last 24 hours) at 09/10/2023 1157 Last data filed at 09/10/2023 1025 Gross per 24 hour  Intake 120 ml  Output 2000 ml  Net -1880 ml   LBM: Last BM Date : 09/08/23 Baseline Weight: Weight: 71.4 kg Most recent weight: Weight: 71.4 kg        Patient Active Problem List   Diagnosis Date Noted   Hospice care patient 09/10/2023   Medical orders for scope of treatment form in chart 09/09/2023   Vaginal bleeding 09/06/2023   UTI (urinary tract infection) 09/05/2023   Suprapubic catheter (HCC) 12/05/2021   Generalized anxiety disorder 01/24/2021   Suspected deep tissue injury 10/25/2020   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  Code Status:    Code Status Orders  (From admission, onward)           Start     Ordered   09/09/23 1230  Do not attempt resuscitation (DNR) - Comfort care  Continuous       Question Answer Comment  If patient has no pulse and is not breathing Do Not Attempt Resuscitation   In Pre-Arrest Conditions (Patient Is Breathing and Has a Pulse) Provide comfort measures. Relieve any mechanical airway obstruction. Avoid transfer unless required for comfort.   Consent: Discussion documented in EHR or advanced directives reviewed      09/09/23 1230           Code Status History     Date Active Date Inactive Code Status Order ID Comments User Context   09/09/2023 1224 09/09/2023 1230 Do not attempt resuscitation (DNR) - Comfort care [id number]  Meribeth Standard, NP Inpatient   09/05/2023 1610 09/09/2023 1224 Full Code [id number]  Gaylin Ke, MD ED   08/31/2019 1801 09/29/2019 1838 Full Code [id number]  Sterling Eisenmenger, PA-C Inpatient   08/31/2019 1801 08/31/2019 1801 Full Code [id number]  Sterling Eisenmenger, PA-C Inpatient   08/28/2019 1849 08/31/2019 1724 Full Code [id number]  Isadora Mar, MD Inpatient   08/26/2019 2242 08/28/2019 1848 Full Code [id number]  Isadora Mar, MD ED   10/21/2016 2105 10/22/2016 1736 Full Code [id number]  McKenzie, Albert Almas, PA-C Inpatient       Thank you for allowing the Palliative Medicine Team to assist in the care of this patient.    Meribeth Standard, NP  Please contact Palliative Medicine Team phone at [phone] for  questions and concerns.

## 2023-09-10 NOTE — Discharge Summary (Addendum)
 Physician Discharge Summary   Patient: Meredith Roach MRN: [id number] DOB: 10/29/1940  Admit date:     09/05/2023  Discharge date: 09/10/23  Discharge Physician: Montey Apa   PCP: Sari Cunning, MD   Recommendations at discharge:    Follow up with [facility] team at home  Discharge Diagnoses: Principal Problem:   UTI (urinary tract infection) Active Problems:   Vaginal bleeding   Medical orders for scope of treatment form in chart   Hospice care patient  Resolved Problems:   * No resolved hospital problems. *  Hospital Course:  83 year old female with PMHx of  incomplete quadriplegia, wheelchair dependent with chronic suprapubic catheter being admitted to the hospital with concern for UTI in the setting of weakness and vaginal bleeding.   See H&P for full HPI on admission & ED course.   Patient was admitted for further evaluation and management of UTI, started on empiric IV Cipro  initially. Urine culture subsequently grew Enterococcus faecalis and antibiotic was changed to PO amoxicillin  on 5/27 based on culture susceptibilities.   Hospital course complicated by hypothermia and soft BP's, persistent mental status changes including hallucinations, poor PO intake with electrolyte derangements requiring IV fluids.     Further hospital course and management as outlined below.   5/30 -- pt doing well when seen this AM, denies acute complaints.  Patient was transitioned to comfort care / hospice yesterday after meeting with Palliative Care.  Hospice team is engaged and ready to assume care of the patient at home later today. Patient is medically stable for discharge.   Assessment and Plan:  Comfort Care / Hospice status -- as of 09/09/23. --Palliative care consulted -- appreciate goals of care discussions --Health visitor engaged for Home Hospice services starting today --Continue Comfort Care meds PRN, per orders --Notify hospice team if any  uncontrolled signs of discomfort / pain etc    A&P Prior to Comfort Measures:  Acute cystitis/UTI suspected due to indwelling suprapubic catheter Neurogenic Bladder Suprapubic catheter was changed on 19th of May by urology PA Concerning due to weakness and mild confusion, which are consistent with her prior UTI. -Initially placed on empiric IV Cipro  --Antibiotic changed to PO amoxicillin  with urine culture growing Enterococcus faecalis   Vaginal bleeding-not severe, not related to UTI.  No obvious uterine mass on transabdominal pelvic ultrasound--showed endometrial thickening. - Consult GYN  Dr Arron Big to perform endometrial biopsy due to cervical stenosis. Other options were discussed with patient's husband and daughter. --pt to f/u Dr Everardo Hitch as out pt if they wish to pursue further --for now they would like to hold off --megace  prn rx for vaginal bleeding By Dr Everardo Hitch   Insomnia with confusion/visual hallucination Pt has not slept well for few nights --trial of trazodone  at bedtime --addendum--spoke with pt's dter Burdette Carolin --pt is not sleeping/eating/confused and "seeing men pushing her bed" -- given trial of ativan  2 mg x1    Hyponatremia/hypokalemia/poor po intake/Dehydration  Weakness-making it difficult for family to care for her at home, may be related to UTI which is being treated as above.  --Fluids finishing today -- on IV NS with 20 mEq Kcl  --encourage po intake  Acute metabolic encephalopathy - due to UTI and electrolyte derangements.  Resolved.   Chronic Diastolic CHF - clinically hypovolemic, on IV fluids --hold metolazone  and torsemide  while on IV fluids --monitor volume status closely   Hypothyroidism --on Synthroid    Functional/Spastic quadriplegia  --on Neurontin , dantrolene , baclofen   GERD --on omeprazole           Consultants: Palliative Care  Procedures performed: None   Disposition: Hospice care at Home  Diet recommendation:  Regular  diet   DISCHARGE MEDICATION: Allergies as of 09/10/2023       Reactions   Ace Inhibitors Anaphylaxis        Medication List     STOP taking these medications    ciprofloxacin  500 MG tablet Commonly known as: CIPRO    metolazone  2.5 MG tablet Commonly known as: ZAROXOLYN    potassium chloride  8 MEQ tablet Commonly known as: KLOR-CON    torsemide  10 MG tablet Commonly known as: DEMADEX        TAKE these medications    acetaminophen  325 MG tablet Commonly known as: TYLENOL  Take 2 tablets (650 mg total) by mouth every 6 (six) hours as needed for mild pain (pain score 1-3) (or Fever >/= 101).   antiseptic oral rinse Liqd Apply 15 mLs topically as needed for dry mouth.   baclofen  20 MG tablet Commonly known as: LIORESAL  Take 1 tablet (20 mg total) by mouth 4 (four) times daily.   cyproheptadine  4 MG tablet Commonly known as: PERIACTIN  Take 1 tablet (4 mg total) by mouth 3 (three) times daily as needed (baclofen  withdrawal).   dantrolene  100 MG capsule Commonly known as: DANTRIUM  Take 1 capsule (100 mg total) by mouth 3 (three) times daily. With Baclofen - Zanaflex  causes sedation   diazepam  5 MG tablet Commonly known as: VALIUM  Take 1 tablet (5 mg total) by mouth every 6 (six) hours as needed for muscle spasms or anxiety.   DULoxetine  60 MG capsule Commonly known as: CYMBALTA  Take 1 capsule (60 mg total) by mouth at bedtime.   gabapentin  600 MG tablet Commonly known as: NEURONTIN  Take 2 tablets (1,200 mg total) by mouth 3 (three) times daily.   glycopyrrolate  1 MG tablet Commonly known as: ROBINUL  Take 1 tablet (1 mg total) by mouth every 4 (four) hours as needed (excessive secretions).   haloperidol  0.5 MG tablet Commonly known as: HALDOL  Take 1 tablet (0.5 mg total) by mouth every 4 (four) hours as needed for agitation (or delirium).   leptospermum manuka honey Pste paste Apply 1 Application topically daily.   levothyroxine  100 MCG tablet Commonly  known as: SYNTHROID  Take 1 tablet (100 mcg total) by mouth daily. Take on an empty stomach with a glass of water at least 30-60 minutes before breakfast.   lidocaine  5 % ointment Commonly known as: XYLOCAINE  Apply 1 application. topically as needed. To LLE- no more than foot/ankle/front of leg to knee at a time- up to 4x/day   megestrol  40 MG/ML suspension Commonly known as: MEGACE  Take 1 mL (40 mg total) by mouth 2 (two) times daily.   omeprazole  40 MG capsule Commonly known as: PRILOSEC Take 1 capsule (40 mg total) by mouth daily.   ondansetron  4 MG tablet Commonly known as: ZOFRAN  Take 1 tablet (4 mg total) by mouth every 6 (six) hours as needed for nausea.   oxyCODONE  5 MG immediate release tablet Commonly known as: Oxy IR/ROXICODONE  Take 0.5-1 tablets (2.5-5 mg total) by mouth every 4 (four) hours as needed for moderate pain (pain score 4-6) or severe pain (pain score 7-10) (2.5mg  for moderate pain, 5mg  for severe pain).   PARoxetine  20 MG tablet Commonly known as: PAXIL  Take 1 tablet (20 mg total) by mouth daily.   polyvinyl alcohol  1.4 % ophthalmic solution Commonly known as: LIQUIFILM TEARS Place 1 drop  into both eyes 4 (four) times daily as needed for dry eyes.   senna-docusate 8.6-50 MG tablet Commonly known as: Senokot-S Take 1 tablet by mouth 2 (two) times daily.   traMADol  50 MG tablet Commonly known as: ULTRAM  Take 1 tablet (50 mg total) by mouth every 6 (six) hours as needed.   traZODone  50 MG tablet Commonly known as: DESYREL  Take 1 tablet (50 mg total) by mouth at bedtime.               Discharge Care Instructions  (From admission, onward)           Start     Ordered   09/10/23 0000  Discharge wound care:       Comments: As above   09/10/23 0954            Follow-up Information     [facility] Follow [facility].   Specialty: Home Health Services Why: New name, [facility]. They will resume home health at  discharge.        Sari Cunning, MD Follow up.   Specialty: Internal Medicine Why: hospital follow up Contact information: [address] [facility] [place] [address] [phone]                Discharge Exam: [facility] Weights   09/05/23 1822  Weight: 71.4 kg   General exam: awake, alert, no acute distress HEENT: moist mucus membranes, hearing grossly normal  Respiratory system: CTAB, no wheezes, rales or rhonchi, normal respiratory effort. Cardiovascular system: normal S1/S2, RRR   Gastrointestinal system: soft, NT, ND Central nervous system: A&O x3. no gross focal neurologic deficits, normal speech Skin: dry, intact, normal temperature Psychiatry: normal mood, congruent affect, judgement and insight appear normal   Condition at discharge: stable  The results of significant diagnostics from this hospitalization (including imaging, microbiology, ancillary and laboratory) are listed below for reference.   Imaging Studies: US  PELVIS (TRANSABDOMINAL ONLY) Result Date: 09/05/2023 CLINICAL DATA:  Vaginal bleeding EXAM: TRANSABDOMINAL ULTRASOUND OF PELVIS TECHNIQUE: Transabdominal ultrasound examination of the pelvis was performed including evaluation of the uterus, ovaries, adnexal regions, and pelvic cul-de-sac. COMPARISON:  CT 03/28/2020 and previous FINDINGS: Uterus Measurements: 6.2 x 2.9 x 4.6 cm = volume: 43 mL. Anteverted. No fibroids or other mass visualized. Endometrium Thickness: 8 mm.  No focal abnormality visualized. Right ovary Not identified Left ovary Not visualized No free fluid.  Suprapubic bladder catheter incidentally noted. IMPRESSION: 1. Negative for uterine mass 2. Endometrial thickening. 3. Nonvisualized ovaries. Electronically Signed   By: Nicoletta Barrier M.D.   On: 09/05/2023 15:36    Microbiology: Results for orders placed or performed during the hospital encounter of 09/05/23  Urine Culture     Status: Abnormal    Collection Time: 09/05/23 11:30 AM   Specimen: Urine, Suprapubic  Result Value Ref Range Status   Specimen Description   Final    URINE, SUPRAPUBIC Performed at [facility], [address] [place], [place] [address]    Special Requests   Final    NONE Performed at [facility], [address] [place], [place] [address]    Culture 30,000 COLONIES/mL ENTEROCOCCUS FAECALIS (A)  Final   Report Status 09/07/2023 FINAL  Final   Organism ID, Bacteria ENTEROCOCCUS FAECALIS (A)  Final      Susceptibility   Enterococcus faecalis - MIC*    AMPICILLIN <=2 SENSITIVE Sensitive     NITROFURANTOIN <=16 SENSITIVE Sensitive  VANCOMYCIN 1 SENSITIVE Sensitive     * 30,000 COLONIES/mL ENTEROCOCCUS FAECALIS    Labs: CBC: Recent Labs  Lab 09/05/23 1232 09/06/23 0438  WBC 5.9 5.7  NEUTROABS 4.6  --   HGB 13.6 12.6  HCT 42.3 38.1  MCV 85.5 83.7  PLT 159 177   Basic Metabolic Panel: Recent Labs  Lab 09/05/23 1232 09/06/23 0438 09/07/23 0939 09/08/23 0105 09/09/23 0247  NA 131* 129* 125* 127* 132*  K 3.8 3.2* 3.0* 3.3* 3.1*  CL 87* 85* 82* 83* 92*  CO2 32 33* 32 36* 30  GLUCOSE 107* 81 100* 100* 87  BUN 10 11 13 15  26*  CREATININE 0.73 0.71 0.59 0.59 0.63  CALCIUM 9.3 8.9 9.1 9.4 9.4  MG  --   --   --   --  1.8  PHOS  --   --   --   --  4.3   Liver Function Tests: No results for input(s): "AST", "ALT", "ALKPHOS", "BILITOT", "PROT", "ALBUMIN " in the last 168 hours. CBG: Recent Labs  Lab 09/08/23 1147 09/08/23 1616 09/08/23 2035 09/09/23 0812 09/09/23 1147  GLUCAP 106* 112* 139* 87 94    Discharge time spent: greater than 30 minutes.  Signed: Montey Apa, DO [facility] 09/10/2023

## 2023-09-10 NOTE — TOC Transition Note (Addendum)
 Transition of Care [facility]) - Discharge Note   Patient Details  Name: Meredith Roach MRN: [id number] Date of Birth: 1940-05-15  Transition of Care [facility]) CM/SW Contact:  Crayton Docker, RN 09/10/2023, 10:05 AM   Clinical Narrative:     Patient to discharge to home with home hospice with [facility]. CM call to [employer] to confirm BLS transport for today at 1230. CM spoke to [facility]; BLS transport confirmed.  CM to patient's room regarding pending discharge and BLS transportation time. Patient voiced no questions or concerns. CM call to [facility],phone [phone] regarding patient discharging with home hospice, not home health. Per Shaun, verbalized understanding.   Final next level of care: Home w Hospice Care Barriers to Discharge: No Barriers Identified   Patient Goals and CMS Choice     Choice offered to / list presented to : Patient, Adult Children    Discharge Placement     Home with hospice          Discharge Plan and Services Additional resources added to the After Visit Summary for       Post Acute Care Choice: Resumption of Svcs/PTA Provider            DME Agency:  [facility] Quitter) Date DME Agency Contacted: 09/09/23 Time DME Agency Contacted: [phone] Representative spoke with at DME Agency: Luan Rumpf HH Arranged: RN HH Agency: [facility] Date HH Agency Contacted: 09/06/23   Representative spoke with at [facility] Agency: Shaun  Social Drivers of Health (SDOH) Interventions SDOH Screenings   Food Insecurity: No Food Insecurity (09/05/2023)  Housing: Low Risk  (09/05/2023)  Transportation Needs: No Transportation Needs (09/05/2023)  Recent Concern: Transportation Needs - Unmet Transportation Needs (08/23/2023)   Received from [facility]  Utilities: Not At Risk (09/05/2023)  Depression (PHQ2-9): Low Risk  (07/27/2023)  Financial Resource Strain: Low Risk  (08/23/2023)   Received from [facility]  Social Connections: Unknown (09/05/2023)  Tobacco Use: Low Risk  (09/05/2023)     Readmission Risk Interventions     No data to display

## 2023-09-16 ENCOUNTER — Other Ambulatory Visit: Payer: Self-pay

## 2023-09-16 MED ORDER — LORAZEPAM 0.5 MG PO TABS
0.5000 mg | ORAL_TABLET | ORAL | 3 refills | Status: DC | PRN
  Filled 2023-09-16: qty 30, 5d supply, fill #0

## 2023-09-20 ENCOUNTER — Other Ambulatory Visit: Payer: Self-pay

## 2023-09-20 MED ORDER — MORPHINE SULFATE (CONCENTRATE) 10 MG /0.5 ML PO SOLN
5.0000 mg | ORAL | 0 refills | Status: DC | PRN
  Filled 2023-09-20: qty 30, 10d supply, fill #0

## 2023-09-20 MED ORDER — HALOPERIDOL 0.5 MG PO TABS
0.5000 mg | ORAL_TABLET | ORAL | 1 refills | Status: DC | PRN
  Filled 2023-09-20: qty 60, 10d supply, fill #0

## 2023-09-20 MED ORDER — DIAZEPAM 5 MG PO TABS
5.0000 mg | ORAL_TABLET | Freq: Four times a day (QID) | ORAL | 1 refills | Status: DC | PRN
  Filled 2023-09-20: qty 60, 15d supply, fill #0

## 2023-09-21 ENCOUNTER — Other Ambulatory Visit: Payer: Self-pay

## 2023-10-04 ENCOUNTER — Ambulatory Visit: Admitting: Urology

## 2023-10-07 ENCOUNTER — Other Ambulatory Visit (HOSPITAL_COMMUNITY): Payer: Self-pay

## 2023-10-12 DEATH — deceased

## 2023-11-08 ENCOUNTER — Ambulatory Visit: Admitting: Physical Medicine and Rehabilitation

## 2023-11-16 ENCOUNTER — Ambulatory Visit: Admitting: Physical Medicine & Rehabilitation
